# Patient Record
Sex: Female | Born: 1945 | Race: White | Hispanic: No | Smoking: Former smoker
Health system: Southern US, Community
[De-identification: ages and names within clinical notes are randomized; demographics above are authoritative.]

## PROBLEM LIST (undated history)

## (undated) DIAGNOSIS — I340 Nonrheumatic mitral (valve) insufficiency: Secondary | ICD-10-CM

## (undated) DIAGNOSIS — J309 Allergic rhinitis, unspecified: Secondary | ICD-10-CM

## (undated) DIAGNOSIS — M1990 Unspecified osteoarthritis, unspecified site: Secondary | ICD-10-CM

## (undated) DIAGNOSIS — I428 Other cardiomyopathies: Secondary | ICD-10-CM

## (undated) DIAGNOSIS — E785 Hyperlipidemia, unspecified: Secondary | ICD-10-CM

## (undated) DIAGNOSIS — I10 Essential (primary) hypertension: Secondary | ICD-10-CM

## (undated) DIAGNOSIS — K219 Gastro-esophageal reflux disease without esophagitis: Secondary | ICD-10-CM

## (undated) DIAGNOSIS — C3490 Malignant neoplasm of unspecified part of unspecified bronchus or lung: Secondary | ICD-10-CM

## (undated) DIAGNOSIS — F419 Anxiety disorder, unspecified: Secondary | ICD-10-CM

## (undated) DIAGNOSIS — J4489 Other specified chronic obstructive pulmonary disease: Secondary | ICD-10-CM

## (undated) DIAGNOSIS — J449 Chronic obstructive pulmonary disease, unspecified: Secondary | ICD-10-CM

## (undated) DIAGNOSIS — G4700 Insomnia, unspecified: Secondary | ICD-10-CM

## (undated) DIAGNOSIS — Z87898 Personal history of other specified conditions: Secondary | ICD-10-CM

## (undated) DIAGNOSIS — F429 Obsessive-compulsive disorder, unspecified: Secondary | ICD-10-CM

## (undated) DIAGNOSIS — K8020 Calculus of gallbladder without cholecystitis without obstruction: Secondary | ICD-10-CM

## (undated) DIAGNOSIS — J189 Pneumonia, unspecified organism: Secondary | ICD-10-CM

## (undated) DIAGNOSIS — G43909 Migraine, unspecified, not intractable, without status migrainosus: Secondary | ICD-10-CM

## (undated) DIAGNOSIS — F32A Depression, unspecified: Secondary | ICD-10-CM

## (undated) DIAGNOSIS — F329 Major depressive disorder, single episode, unspecified: Secondary | ICD-10-CM

## (undated) DIAGNOSIS — S22009A Unspecified fracture of unspecified thoracic vertebra, initial encounter for closed fracture: Secondary | ICD-10-CM

## (undated) DIAGNOSIS — I4891 Unspecified atrial fibrillation: Secondary | ICD-10-CM

## (undated) DIAGNOSIS — I509 Heart failure, unspecified: Secondary | ICD-10-CM

## (undated) HISTORY — DX: Other cardiomyopathies: I42.8

## (undated) HISTORY — DX: Unspecified atrial fibrillation: I48.91

## (undated) HISTORY — DX: Personal history of other specified conditions: Z87.898

## (undated) HISTORY — DX: Obsessive-compulsive disorder, unspecified: F42.9

## (undated) HISTORY — DX: Nonrheumatic mitral (valve) insufficiency: I34.0

## (undated) HISTORY — DX: Chronic obstructive pulmonary disease, unspecified: J44.9

## (undated) HISTORY — DX: Calculus of gallbladder without cholecystitis without obstruction: K80.20

## (undated) HISTORY — DX: Insomnia, unspecified: G47.00

## (undated) HISTORY — DX: Hyperlipidemia, unspecified: E78.5

## (undated) HISTORY — DX: Unspecified osteoarthritis, unspecified site: M19.90

## (undated) HISTORY — DX: Depression, unspecified: F32.A

## (undated) HISTORY — DX: Major depressive disorder, single episode, unspecified: F32.9

## (undated) HISTORY — PX: CARDIOVERSION: SHX1299

## (undated) HISTORY — DX: Essential (primary) hypertension: I10

## (undated) HISTORY — DX: Anxiety disorder, unspecified: F41.9

---

## 1970-02-09 HISTORY — PX: TUBAL LIGATION: SHX77

## 2003-06-24 ENCOUNTER — Inpatient Hospital Stay (HOSPITAL_COMMUNITY): Admission: EM | Admit: 2003-06-24 | Discharge: 2003-06-26 | Payer: Self-pay | Admitting: Emergency Medicine

## 2003-06-25 ENCOUNTER — Encounter (INDEPENDENT_AMBULATORY_CARE_PROVIDER_SITE_OTHER): Payer: Self-pay | Admitting: Cardiology

## 2003-06-28 ENCOUNTER — Encounter: Admission: RE | Admit: 2003-06-28 | Discharge: 2003-06-28 | Payer: Self-pay | Admitting: Internal Medicine

## 2003-07-02 ENCOUNTER — Encounter: Admission: RE | Admit: 2003-07-02 | Discharge: 2003-07-02 | Payer: Self-pay | Admitting: Internal Medicine

## 2003-07-09 ENCOUNTER — Encounter: Admission: RE | Admit: 2003-07-09 | Discharge: 2003-07-09 | Payer: Self-pay | Admitting: Internal Medicine

## 2003-07-11 ENCOUNTER — Encounter: Admission: RE | Admit: 2003-07-11 | Discharge: 2003-07-11 | Payer: Self-pay | Admitting: Internal Medicine

## 2003-07-17 ENCOUNTER — Encounter: Admission: RE | Admit: 2003-07-17 | Discharge: 2003-07-17 | Payer: Self-pay | Admitting: Internal Medicine

## 2003-07-23 ENCOUNTER — Encounter: Admission: RE | Admit: 2003-07-23 | Discharge: 2003-07-23 | Payer: Self-pay | Admitting: Internal Medicine

## 2003-07-26 ENCOUNTER — Encounter: Admission: RE | Admit: 2003-07-26 | Discharge: 2003-07-26 | Payer: Self-pay | Admitting: Internal Medicine

## 2003-07-30 ENCOUNTER — Encounter: Admission: RE | Admit: 2003-07-30 | Discharge: 2003-07-30 | Payer: Self-pay | Admitting: Internal Medicine

## 2003-08-01 ENCOUNTER — Ambulatory Visit (HOSPITAL_COMMUNITY): Admission: RE | Admit: 2003-08-01 | Discharge: 2003-08-01 | Payer: Self-pay | Admitting: Internal Medicine

## 2003-08-06 ENCOUNTER — Ambulatory Visit (HOSPITAL_COMMUNITY): Admission: RE | Admit: 2003-08-06 | Discharge: 2003-08-06 | Payer: Self-pay | Admitting: Cardiology

## 2003-08-16 ENCOUNTER — Encounter: Admission: RE | Admit: 2003-08-16 | Discharge: 2003-08-16 | Payer: Self-pay | Admitting: Internal Medicine

## 2003-08-23 ENCOUNTER — Encounter: Admission: RE | Admit: 2003-08-23 | Discharge: 2003-08-23 | Payer: Self-pay | Admitting: Internal Medicine

## 2003-08-24 ENCOUNTER — Encounter: Admission: RE | Admit: 2003-08-24 | Discharge: 2003-08-24 | Payer: Self-pay | Admitting: Internal Medicine

## 2003-09-07 ENCOUNTER — Encounter: Admission: RE | Admit: 2003-09-07 | Discharge: 2003-09-07 | Payer: Self-pay | Admitting: Internal Medicine

## 2003-09-13 ENCOUNTER — Encounter: Admission: RE | Admit: 2003-09-13 | Discharge: 2003-09-13 | Payer: Self-pay | Admitting: Internal Medicine

## 2003-10-05 ENCOUNTER — Emergency Department (HOSPITAL_COMMUNITY): Admission: EM | Admit: 2003-10-05 | Discharge: 2003-10-05 | Payer: Self-pay | Admitting: Emergency Medicine

## 2003-11-22 ENCOUNTER — Ambulatory Visit: Payer: Self-pay | Admitting: Internal Medicine

## 2003-12-06 ENCOUNTER — Ambulatory Visit: Payer: Self-pay | Admitting: Internal Medicine

## 2004-02-15 ENCOUNTER — Ambulatory Visit: Payer: Self-pay | Admitting: Internal Medicine

## 2004-05-21 ENCOUNTER — Ambulatory Visit: Payer: Self-pay | Admitting: Internal Medicine

## 2004-06-01 ENCOUNTER — Emergency Department (HOSPITAL_COMMUNITY): Admission: EM | Admit: 2004-06-01 | Discharge: 2004-06-02 | Payer: Self-pay | Admitting: Emergency Medicine

## 2004-06-05 ENCOUNTER — Emergency Department (HOSPITAL_COMMUNITY): Admission: EM | Admit: 2004-06-05 | Discharge: 2004-06-05 | Payer: Self-pay | Admitting: Family Medicine

## 2004-06-25 ENCOUNTER — Ambulatory Visit: Payer: Self-pay | Admitting: Internal Medicine

## 2004-07-25 ENCOUNTER — Ambulatory Visit: Payer: Self-pay | Admitting: Internal Medicine

## 2004-08-08 ENCOUNTER — Ambulatory Visit: Payer: Self-pay | Admitting: Internal Medicine

## 2005-10-05 ENCOUNTER — Ambulatory Visit: Payer: Self-pay | Admitting: Hospitalist

## 2005-10-27 ENCOUNTER — Ambulatory Visit (HOSPITAL_COMMUNITY): Admission: RE | Admit: 2005-10-27 | Discharge: 2005-10-27 | Payer: Self-pay | Admitting: Hospitalist

## 2005-10-27 ENCOUNTER — Ambulatory Visit: Payer: Self-pay | Admitting: Hospitalist

## 2005-12-29 DIAGNOSIS — Z87442 Personal history of urinary calculi: Secondary | ICD-10-CM

## 2005-12-29 DIAGNOSIS — G4700 Insomnia, unspecified: Secondary | ICD-10-CM | POA: Insufficient documentation

## 2005-12-29 DIAGNOSIS — I10 Essential (primary) hypertension: Secondary | ICD-10-CM | POA: Insufficient documentation

## 2005-12-29 DIAGNOSIS — I38 Endocarditis, valve unspecified: Secondary | ICD-10-CM | POA: Insufficient documentation

## 2005-12-29 DIAGNOSIS — K8020 Calculus of gallbladder without cholecystitis without obstruction: Secondary | ICD-10-CM | POA: Insufficient documentation

## 2005-12-29 DIAGNOSIS — F429 Obsessive-compulsive disorder, unspecified: Secondary | ICD-10-CM | POA: Insufficient documentation

## 2005-12-29 DIAGNOSIS — F418 Other specified anxiety disorders: Secondary | ICD-10-CM

## 2005-12-29 DIAGNOSIS — I4891 Unspecified atrial fibrillation: Secondary | ICD-10-CM | POA: Insufficient documentation

## 2005-12-29 DIAGNOSIS — I428 Other cardiomyopathies: Secondary | ICD-10-CM

## 2006-11-05 ENCOUNTER — Encounter (INDEPENDENT_AMBULATORY_CARE_PROVIDER_SITE_OTHER): Payer: Self-pay | Admitting: *Deleted

## 2006-11-05 ENCOUNTER — Ambulatory Visit: Payer: Self-pay | Admitting: Internal Medicine

## 2006-11-05 LAB — CONVERTED CEMR LAB
BUN: 16 mg/dL (ref 6–23)
CO2: 29 meq/L (ref 19–32)
Creatinine, Ser: 0.99 mg/dL (ref 0.40–1.20)
Glucose, Bld: 112 mg/dL — ABNORMAL HIGH (ref 70–99)
Sodium: 142 meq/L (ref 135–145)

## 2006-11-12 ENCOUNTER — Telehealth: Payer: Self-pay | Admitting: *Deleted

## 2006-11-23 ENCOUNTER — Telehealth: Payer: Self-pay | Admitting: *Deleted

## 2007-01-31 ENCOUNTER — Telehealth (INDEPENDENT_AMBULATORY_CARE_PROVIDER_SITE_OTHER): Payer: Self-pay | Admitting: Pharmacy Technician

## 2007-04-08 ENCOUNTER — Emergency Department (HOSPITAL_COMMUNITY): Admission: EM | Admit: 2007-04-08 | Discharge: 2007-04-08 | Payer: Self-pay | Admitting: Emergency Medicine

## 2007-04-13 ENCOUNTER — Ambulatory Visit: Payer: Self-pay | Admitting: *Deleted

## 2007-04-13 DIAGNOSIS — J019 Acute sinusitis, unspecified: Secondary | ICD-10-CM | POA: Insufficient documentation

## 2007-05-03 ENCOUNTER — Ambulatory Visit: Payer: Self-pay | Admitting: Internal Medicine

## 2007-06-20 ENCOUNTER — Telehealth (INDEPENDENT_AMBULATORY_CARE_PROVIDER_SITE_OTHER): Payer: Self-pay | Admitting: *Deleted

## 2007-06-21 ENCOUNTER — Telehealth (INDEPENDENT_AMBULATORY_CARE_PROVIDER_SITE_OTHER): Payer: Self-pay | Admitting: *Deleted

## 2008-01-25 ENCOUNTER — Emergency Department (HOSPITAL_COMMUNITY): Admission: EM | Admit: 2008-01-25 | Discharge: 2008-01-25 | Payer: Self-pay | Admitting: Emergency Medicine

## 2008-02-17 ENCOUNTER — Telehealth: Payer: Self-pay | Admitting: Internal Medicine

## 2008-02-23 ENCOUNTER — Encounter: Payer: Self-pay | Admitting: Internal Medicine

## 2008-02-23 ENCOUNTER — Ambulatory Visit: Payer: Self-pay | Admitting: Internal Medicine

## 2008-02-26 LAB — CONVERTED CEMR LAB
Albumin: 3.7 g/dL (ref 3.5–5.2)
Alkaline Phosphatase: 68 units/L (ref 39–117)
CO2: 23 meq/L (ref 19–32)
Creatinine, Ser: 0.72 mg/dL (ref 0.40–1.20)
Sodium: 147 meq/L — ABNORMAL HIGH (ref 135–145)

## 2008-02-27 ENCOUNTER — Encounter: Payer: Self-pay | Admitting: Internal Medicine

## 2008-03-16 ENCOUNTER — Telehealth: Payer: Self-pay | Admitting: Internal Medicine

## 2008-03-28 ENCOUNTER — Ambulatory Visit: Payer: Self-pay | Admitting: Internal Medicine

## 2008-03-28 DIAGNOSIS — J449 Chronic obstructive pulmonary disease, unspecified: Secondary | ICD-10-CM | POA: Insufficient documentation

## 2008-04-20 ENCOUNTER — Telehealth: Payer: Self-pay | Admitting: Internal Medicine

## 2008-04-20 DIAGNOSIS — J441 Chronic obstructive pulmonary disease with (acute) exacerbation: Secondary | ICD-10-CM | POA: Insufficient documentation

## 2008-05-21 ENCOUNTER — Ambulatory Visit: Payer: Self-pay | Admitting: Internal Medicine

## 2008-06-21 ENCOUNTER — Telehealth: Payer: Self-pay | Admitting: Internal Medicine

## 2008-06-25 ENCOUNTER — Telehealth: Payer: Self-pay | Admitting: Internal Medicine

## 2008-07-20 ENCOUNTER — Ambulatory Visit: Payer: Self-pay | Admitting: Internal Medicine

## 2008-07-20 ENCOUNTER — Encounter: Payer: Self-pay | Admitting: Internal Medicine

## 2008-07-20 LAB — CONVERTED CEMR LAB
Alkaline Phosphatase: 71 units/L (ref 39–117)
CO2: 27 meq/L (ref 19–32)
Calcium: 9.4 mg/dL (ref 8.4–10.5)
Creatinine, Ser: 1.04 mg/dL (ref 0.40–1.20)
GFR calc Af Amer: >60 mL/min (ref >60)
Glucose, Bld: 97 mg/dL (ref 70–99)
Potassium: 3.6 meq/L (ref 3.5–5.3)
Sodium: 145 meq/L (ref 135–145)

## 2008-07-30 ENCOUNTER — Ambulatory Visit: Payer: Self-pay | Admitting: Pulmonary Disease

## 2008-08-08 ENCOUNTER — Telehealth: Payer: Self-pay | Admitting: Pulmonary Disease

## 2008-08-24 ENCOUNTER — Ambulatory Visit: Payer: Self-pay | Admitting: Pulmonary Disease

## 2008-09-10 ENCOUNTER — Telehealth: Payer: Self-pay | Admitting: Internal Medicine

## 2008-09-25 ENCOUNTER — Telehealth: Payer: Self-pay | Admitting: *Deleted

## 2008-10-23 ENCOUNTER — Telehealth (INDEPENDENT_AMBULATORY_CARE_PROVIDER_SITE_OTHER): Payer: Self-pay | Admitting: *Deleted

## 2008-10-25 ENCOUNTER — Ambulatory Visit: Payer: Self-pay | Admitting: Infectious Diseases

## 2008-10-25 ENCOUNTER — Encounter: Payer: Self-pay | Admitting: Internal Medicine

## 2008-10-26 ENCOUNTER — Ambulatory Visit (HOSPITAL_COMMUNITY): Admission: RE | Admit: 2008-10-26 | Discharge: 2008-10-26 | Payer: Self-pay | Admitting: Internal Medicine

## 2008-10-26 ENCOUNTER — Encounter: Payer: Self-pay | Admitting: Internal Medicine

## 2008-10-30 ENCOUNTER — Ambulatory Visit (HOSPITAL_COMMUNITY): Admission: RE | Admit: 2008-10-30 | Discharge: 2008-10-30 | Payer: Self-pay | Admitting: Internal Medicine

## 2008-10-31 ENCOUNTER — Encounter: Payer: Self-pay | Admitting: Internal Medicine

## 2008-11-07 ENCOUNTER — Ambulatory Visit (HOSPITAL_COMMUNITY): Admission: RE | Admit: 2008-11-07 | Discharge: 2008-11-07 | Payer: Self-pay | Admitting: Internal Medicine

## 2008-11-07 ENCOUNTER — Ambulatory Visit: Payer: Self-pay | Admitting: Internal Medicine

## 2008-11-07 ENCOUNTER — Encounter: Payer: Self-pay | Admitting: Internal Medicine

## 2008-11-22 ENCOUNTER — Emergency Department (HOSPITAL_COMMUNITY): Admission: EM | Admit: 2008-11-22 | Discharge: 2008-11-22 | Payer: Self-pay | Admitting: Emergency Medicine

## 2008-11-26 ENCOUNTER — Encounter (INDEPENDENT_AMBULATORY_CARE_PROVIDER_SITE_OTHER): Payer: Self-pay | Admitting: Internal Medicine

## 2008-11-26 ENCOUNTER — Ambulatory Visit: Payer: Self-pay | Admitting: Internal Medicine

## 2008-11-26 LAB — CONVERTED CEMR LAB
Cholesterol: 181 mg/dL (ref 0–200)
HDL: 45 mg/dL (ref >39)
Total CHOL/HDL Ratio: 4
Triglycerides: 119 mg/dL (ref <150)

## 2008-11-28 ENCOUNTER — Ambulatory Visit: Payer: Self-pay | Admitting: Sports Medicine

## 2008-11-28 DIAGNOSIS — M1990 Unspecified osteoarthritis, unspecified site: Secondary | ICD-10-CM

## 2008-12-11 ENCOUNTER — Encounter: Admission: RE | Admit: 2008-12-11 | Discharge: 2009-02-06 | Payer: Self-pay | Admitting: Sports Medicine

## 2008-12-11 ENCOUNTER — Telehealth: Payer: Self-pay | Admitting: Internal Medicine

## 2008-12-17 ENCOUNTER — Telehealth: Payer: Self-pay | Admitting: Internal Medicine

## 2008-12-28 ENCOUNTER — Ambulatory Visit: Payer: Self-pay | Admitting: Infectious Disease

## 2008-12-28 ENCOUNTER — Inpatient Hospital Stay (HOSPITAL_COMMUNITY): Admission: RE | Admit: 2008-12-28 | Discharge: 2008-12-31 | Payer: Self-pay | Admitting: Internal Medicine

## 2008-12-28 ENCOUNTER — Ambulatory Visit: Payer: Self-pay | Admitting: Internal Medicine

## 2008-12-31 ENCOUNTER — Encounter (INDEPENDENT_AMBULATORY_CARE_PROVIDER_SITE_OTHER): Payer: Self-pay | Admitting: Internal Medicine

## 2009-01-07 ENCOUNTER — Ambulatory Visit: Payer: Self-pay | Admitting: Internal Medicine

## 2009-01-07 DIAGNOSIS — F17200 Nicotine dependence, unspecified, uncomplicated: Secondary | ICD-10-CM

## 2009-01-08 LAB — CONVERTED CEMR LAB
BUN: 16 mg/dL (ref 6–23)
Basophils Absolute: 0 10*3/uL (ref 0.0–0.1)
Basophils Relative: 0 % (ref 0–1)
CO2: 26 meq/L (ref 19–32)
Calcium: 9.1 mg/dL (ref 8.4–10.5)
Chloride: 101 meq/L (ref 96–112)
Creatinine, Ser: 0.79 mg/dL (ref 0.40–1.20)
Eosinophils Relative: 1 % (ref 0–5)
Glucose, Bld: 91 mg/dL (ref 70–99)
Lymphs Abs: 2.9 10*3/uL (ref 0.7–4.0)
Monocytes Absolute: 1.1 10*3/uL — ABNORMAL HIGH (ref 0.1–1.0)
Monocytes Relative: 10 % (ref 3–12)
Potassium: 4 meq/L (ref 3.5–5.3)
RDW: 14.1 % (ref 11.5–15.5)
Sodium: 142 meq/L (ref 135–145)
WBC: 11 10*3/uL — ABNORMAL HIGH (ref 4.0–10.5)

## 2009-01-14 ENCOUNTER — Encounter: Payer: Self-pay | Admitting: Internal Medicine

## 2009-01-22 ENCOUNTER — Ambulatory Visit: Payer: Self-pay | Admitting: Infectious Diseases

## 2009-01-22 LAB — CONVERTED CEMR LAB
BUN: 20 mg/dL (ref 6–23)
Chloride: 102 meq/L (ref 96–112)
Glucose, Bld: 85 mg/dL (ref 70–99)
Potassium: 4 meq/L (ref 3.5–5.3)

## 2009-02-15 ENCOUNTER — Encounter: Payer: Self-pay | Admitting: Internal Medicine

## 2009-03-29 ENCOUNTER — Emergency Department (HOSPITAL_COMMUNITY): Admission: EM | Admit: 2009-03-29 | Discharge: 2009-03-29 | Payer: Self-pay | Admitting: Emergency Medicine

## 2009-04-02 ENCOUNTER — Ambulatory Visit: Payer: Self-pay | Admitting: Internal Medicine

## 2009-04-03 ENCOUNTER — Telehealth: Payer: Self-pay | Admitting: Internal Medicine

## 2009-05-02 ENCOUNTER — Ambulatory Visit (HOSPITAL_COMMUNITY): Admission: RE | Admit: 2009-05-02 | Discharge: 2009-05-02 | Payer: Self-pay | Admitting: Internal Medicine

## 2009-05-02 ENCOUNTER — Ambulatory Visit: Payer: Self-pay | Admitting: Internal Medicine

## 2009-05-03 ENCOUNTER — Telehealth (INDEPENDENT_AMBULATORY_CARE_PROVIDER_SITE_OTHER): Payer: Self-pay | Admitting: *Deleted

## 2009-05-06 ENCOUNTER — Telehealth (INDEPENDENT_AMBULATORY_CARE_PROVIDER_SITE_OTHER): Payer: Self-pay | Admitting: *Deleted

## 2009-05-10 ENCOUNTER — Telehealth: Payer: Self-pay | Admitting: Internal Medicine

## 2009-05-13 ENCOUNTER — Ambulatory Visit (HOSPITAL_COMMUNITY): Admission: RE | Admit: 2009-05-13 | Discharge: 2009-05-13 | Payer: Self-pay | Admitting: Internal Medicine

## 2009-05-13 ENCOUNTER — Ambulatory Visit: Payer: Self-pay | Admitting: Internal Medicine

## 2009-05-17 ENCOUNTER — Telehealth (INDEPENDENT_AMBULATORY_CARE_PROVIDER_SITE_OTHER): Payer: Self-pay | Admitting: *Deleted

## 2009-05-20 ENCOUNTER — Encounter: Payer: Self-pay | Admitting: Internal Medicine

## 2009-05-20 ENCOUNTER — Ambulatory Visit: Payer: Self-pay | Admitting: Internal Medicine

## 2009-05-20 DIAGNOSIS — M25569 Pain in unspecified knee: Secondary | ICD-10-CM

## 2009-05-21 ENCOUNTER — Ambulatory Visit (HOSPITAL_COMMUNITY): Admission: RE | Admit: 2009-05-21 | Discharge: 2009-05-21 | Payer: Self-pay | Admitting: Internal Medicine

## 2009-05-23 ENCOUNTER — Telehealth: Payer: Self-pay | Admitting: Internal Medicine

## 2009-05-24 ENCOUNTER — Ambulatory Visit: Payer: Self-pay | Admitting: Internal Medicine

## 2009-05-24 DIAGNOSIS — E785 Hyperlipidemia, unspecified: Secondary | ICD-10-CM

## 2009-05-29 ENCOUNTER — Telehealth: Payer: Self-pay | Admitting: Internal Medicine

## 2009-06-04 ENCOUNTER — Telehealth: Payer: Self-pay | Admitting: Internal Medicine

## 2009-06-09 ENCOUNTER — Emergency Department (HOSPITAL_COMMUNITY): Admission: EM | Admit: 2009-06-09 | Discharge: 2009-06-10 | Payer: Self-pay | Admitting: Emergency Medicine

## 2009-06-14 ENCOUNTER — Ambulatory Visit: Payer: Self-pay | Admitting: Internal Medicine

## 2009-06-19 ENCOUNTER — Telehealth: Payer: Self-pay | Admitting: Internal Medicine

## 2009-06-24 ENCOUNTER — Telehealth: Payer: Self-pay | Admitting: Internal Medicine

## 2009-06-26 ENCOUNTER — Ambulatory Visit: Payer: Self-pay | Admitting: Infectious Disease

## 2009-06-27 ENCOUNTER — Encounter: Payer: Self-pay | Admitting: Internal Medicine

## 2009-06-27 LAB — CONVERTED CEMR LAB
Amphetamine Screen, Ur: NEGATIVE
BUN: 13 mg/dL (ref 6–23)
Barbiturate Quant, Ur: NEGATIVE
Benzodiazepines.: NEGATIVE
Chloride: 101 meq/L (ref 96–112)
Cocaine Metabolites: NEGATIVE
Creatinine, Ser: 0.8 mg/dL (ref 0.40–1.20)
Methadone: NEGATIVE
Phencyclidine (PCP): NEGATIVE
Potassium: 4.4 meq/L (ref 3.5–5.3)
Propoxyphene: NEGATIVE

## 2009-06-28 ENCOUNTER — Telehealth: Payer: Self-pay | Admitting: Internal Medicine

## 2009-06-28 ENCOUNTER — Encounter (INDEPENDENT_AMBULATORY_CARE_PROVIDER_SITE_OTHER): Payer: Self-pay | Admitting: Emergency Medicine

## 2009-06-28 ENCOUNTER — Emergency Department (HOSPITAL_COMMUNITY): Admission: EM | Admit: 2009-06-28 | Discharge: 2009-06-28 | Payer: Self-pay | Admitting: Emergency Medicine

## 2009-06-28 ENCOUNTER — Ambulatory Visit: Payer: Self-pay | Admitting: Vascular Surgery

## 2009-07-01 ENCOUNTER — Telehealth: Payer: Self-pay | Admitting: Internal Medicine

## 2009-07-03 ENCOUNTER — Ambulatory Visit: Payer: Self-pay | Admitting: Internal Medicine

## 2009-07-03 DIAGNOSIS — R609 Edema, unspecified: Secondary | ICD-10-CM

## 2009-07-04 ENCOUNTER — Telehealth: Payer: Self-pay | Admitting: Pulmonary Disease

## 2009-07-10 ENCOUNTER — Telehealth: Payer: Self-pay | Admitting: Internal Medicine

## 2009-07-16 ENCOUNTER — Telehealth: Payer: Self-pay | Admitting: Internal Medicine

## 2009-07-25 ENCOUNTER — Encounter: Payer: Self-pay | Admitting: Internal Medicine

## 2009-07-29 ENCOUNTER — Ambulatory Visit: Payer: Self-pay | Admitting: Internal Medicine

## 2009-07-29 ENCOUNTER — Emergency Department (HOSPITAL_COMMUNITY): Admission: EM | Admit: 2009-07-29 | Discharge: 2009-07-29 | Payer: Self-pay | Admitting: Emergency Medicine

## 2009-08-13 ENCOUNTER — Telehealth: Payer: Self-pay | Admitting: *Deleted

## 2009-08-19 ENCOUNTER — Telehealth: Payer: Self-pay | Admitting: *Deleted

## 2009-08-28 ENCOUNTER — Ambulatory Visit: Payer: Self-pay | Admitting: Internal Medicine

## 2009-09-05 ENCOUNTER — Telehealth: Payer: Self-pay | Admitting: Internal Medicine

## 2009-09-12 ENCOUNTER — Telehealth: Payer: Self-pay | Admitting: Internal Medicine

## 2009-10-02 ENCOUNTER — Telehealth: Payer: Self-pay | Admitting: Internal Medicine

## 2009-10-10 ENCOUNTER — Telehealth: Payer: Self-pay | Admitting: Internal Medicine

## 2009-10-15 ENCOUNTER — Telehealth: Payer: Self-pay | Admitting: Internal Medicine

## 2009-10-21 ENCOUNTER — Ambulatory Visit: Payer: Self-pay | Admitting: Internal Medicine

## 2009-10-21 DIAGNOSIS — M1990 Unspecified osteoarthritis, unspecified site: Secondary | ICD-10-CM | POA: Insufficient documentation

## 2009-10-25 ENCOUNTER — Telehealth: Payer: Self-pay | Admitting: *Deleted

## 2009-11-08 ENCOUNTER — Telehealth: Payer: Self-pay | Admitting: Internal Medicine

## 2009-11-18 ENCOUNTER — Telehealth: Payer: Self-pay | Admitting: Internal Medicine

## 2009-12-02 ENCOUNTER — Telehealth: Payer: Self-pay | Admitting: Internal Medicine

## 2009-12-23 ENCOUNTER — Telehealth: Payer: Self-pay | Admitting: Internal Medicine

## 2009-12-24 ENCOUNTER — Telehealth: Payer: Self-pay | Admitting: Internal Medicine

## 2010-01-06 ENCOUNTER — Telehealth: Payer: Self-pay | Admitting: Internal Medicine

## 2010-01-14 ENCOUNTER — Telehealth: Payer: Self-pay | Admitting: Internal Medicine

## 2010-01-22 ENCOUNTER — Ambulatory Visit: Payer: Self-pay | Admitting: Internal Medicine

## 2010-01-29 ENCOUNTER — Telehealth: Payer: Self-pay | Admitting: Internal Medicine

## 2010-02-05 ENCOUNTER — Telehealth (INDEPENDENT_AMBULATORY_CARE_PROVIDER_SITE_OTHER): Payer: Self-pay | Admitting: *Deleted

## 2010-02-10 ENCOUNTER — Telehealth: Payer: Self-pay | Admitting: Internal Medicine

## 2010-02-17 ENCOUNTER — Ambulatory Visit: Admit: 2010-02-17 | Payer: Self-pay

## 2010-02-25 ENCOUNTER — Telehealth: Payer: Self-pay | Admitting: Internal Medicine

## 2010-02-28 ENCOUNTER — Ambulatory Visit: Admission: RE | Admit: 2010-02-28 | Discharge: 2010-02-28 | Payer: Self-pay | Source: Home / Self Care

## 2010-02-28 LAB — CONVERTED CEMR LAB
AST: 15 units/L (ref 0–37)
Albumin: 4.3 g/dL (ref 3.5–5.2)
Alkaline Phosphatase: 104 units/L (ref 39–117)
Total Bilirubin: 0.3 mg/dL (ref 0.3–1.2)

## 2010-03-02 ENCOUNTER — Encounter: Payer: Self-pay | Admitting: Orthopedic Surgery
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
## 2010-03-11 NOTE — Progress Notes (Signed)
Summary: phone/gg  Phone Note Call from Patient   Caller: Patient Summary of Call: Pt called to let us know she was feeling better.  Her breathing is better and thrush is better. She has very dry mouth and cough is worse, productive cough that may last 45 minutes since saturday.  cough sounds deep. she takes HYDROMET 5-1.5 MG/5ML SYRP without relief. Pt # [id number] Pt on a avelox/steriods and side effects list may cause tendon problems.  she is concerned.................can you call this patient, please... Initial call taken by: Merrie Roof RN,  May 06, 2009 2:03 PM  Follow-up for Phone Call        Main c/o is dry mouth since starting the meds including the steroids and inhaler.  SHe thinks it is related to the thrush.   Asking regarding gargling with salt water and if it is ok - told her it is fine. WIll send in electronically fluconazole 200 by mouth once daily for 5 days. Advised re low risk of tendon rupture but she should not engage in aggressive vigoorous activity. Also trouble sleeping due to the meds.  Cough is worse even though breathing is better.  No fevers chills. Follow-up by: Clydie Braun MD,  May 06, 2009 3:23 PM  Additional Follow-up for Phone Call Additional follow up Details #1::        Inocencio Homes and chilon can we move her apptment up to next week for repeat eval Additional Follow-up by: Clydie Braun MD,  May 06, 2009 3:55 PM    Additional Follow-up for Phone Call Additional follow up Details #2::    appointment schedule for next week. Follow-up by: Merrie Roof RN,  May 07, 2009 10:55 AM  New/Updated Medications: DIFLUCAN 200 MG TABS (FLUCONAZOLE) one by mouth once daily for 5 days Prescriptions: DIFLUCAN 200 MG TABS (FLUCONAZOLE) one by mouth once daily for 5 days  #5 x 0   Entered and Authorized by:   Clydie Braun MD   Signed by:   Clydie Braun MD on 05/06/2009   Method used:   Electronically to        [facility]  [address] [id number]  (retail)       [address]       [place], [place]  [address]       Ph: [phone] or [phone]       Fax: [phone]   RxID:   [id number]

## 2010-03-11 NOTE — Progress Notes (Signed)
Summary: refill/gg  Phone Note Refill Request  on Jun 24, 2009 2:01 PM  Refills Requested: Medication #1:  PERCOCET 5-325 MG TABS take 1 tablet every 6 hours as needed for pain   Last Refilled: 06/14/2009 Pt needs refill Pt # [phone]   Method Requested: Pick up at Office Initial call taken by: Merrie Roof RN,  Jun 24, 2009 2:01 PM  Follow-up for Phone Call        Patient was started on percocet by Dr. [name] and then refill on May the 6 by Dr. Comer Locket; if she is still in pain (assuming is her knee the problem), would be better for her to be reevaluated, before continue giving more pain meds only.     Appended Document: refill/gg scheduled to see Dr Comer Locket on Wed.

## 2010-03-11 NOTE — Progress Notes (Signed)
Summary: nos appt  Phone Note Call from Patient   Caller: [email or web address]  Call For: alva Summary of Call: LMTCB x2 to rsc nos from 5/25. Initial call taken by: Darletta Moll,  Jul 04, 2009 2:21 PM
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
## 2010-03-11 NOTE — Progress Notes (Signed)
Summary: phone/gg  Phone Note Call from Patient   Caller: Patient Summary of Call: Pt called with c/o thrush in mouth. She was treated on 7/28 with FLUCONAZOLE 200 MG TABS (FLUCONAZOLE) Take 1 tablet by mouth once a day  #3 x 0.  She states the thrush is returning and needs more meds.  Please advise  -  would you want her seen for eval? Pt 3 [phone] Initial call taken by: Merrie Roof RN,  September 12, 2009 11:51 AM  Follow-up for Phone Call        We need to see to see her to make certain the thrush is the correct dx. Pls make pt an appt. Can be an acute slot with any resident Follow-up by: Blanch Media MD,  September 12, 2009 12:05 PM     Appended Document: phone/gg appointment given for today.....message left for pt.

## 2010-03-11 NOTE — Progress Notes (Signed)
Summary: phone/gg  Phone Note Call from Patient   Caller: Patient Complaint: Abdominal Pain Summary of Call: Pt called c/o severe knee pain.  She was seen here for the same on 4/11 and put on mobic 15 mg daily.  Pt called today stating the meds don't help at all.  She is in tears and needs something else for the pain.   Pt # [id number] Initial call taken by: Merrie Roof RN,  May 23, 2009 9:38 AM  Follow-up for Phone Call        As protocol,I don't give narcs over the phone, esp with a pt I have never seen.  I will call in Tramadol for a couple of days until she can be furher evaluated (her plain film was negative)  Please see if she can get in today or tomorrow for eval.  Let me know when her appt is so I can order appropriate # of tramadol.  Thanks  I ordered 6 tramadol to get her through till her appt. Follow-up by: Blanch Media MD,  May 23, 2009 10:37 AM  Additional Follow-up for Phone Call Additional follow up Details #1::        Inocencio Homes stated that pt hallucinates when she takes tramadol. [facility] will cancel Rx. Pt can take tylenol up to 1000 mg QID along with Mobic until appt tomorrow AM.  Additional Follow-up by: Blanch Media MD,  May 23, 2009 10:57 AM    Additional Follow-up for Phone Call Additional follow up Details #2::    Pt informed and she states she has been taking tylenol arthritis already.  She said she has some cough syrup with hydrocodone in it and will take that if needed.  It helps the pain and she wants to be honest with [place]. Follow-up by: Merrie Roof RN,  May 23, 2009 11:01 AM  Additional Follow-up for Phone Call Additional follow up Details #3:: Details for Additional Follow-up Action Taken: that is ok Additional Follow-up by: Blanch Media MD,  May 23, 2009 11:04 AM  New/Updated Medications: TRAMADOL HCL 50 MG TABS (TRAMADOL HCL) One pill by mouth every six hours as needed for knee pain. Prescriptions: TRAMADOL HCL 50 MG TABS  (TRAMADOL HCL) One pill by mouth every six hours as needed for knee pain.  #6 x 0   Entered and Authorized by:   Blanch Media MD   Signed by:   Blanch Media MD on 05/23/2009   Method used:   [facility]  [address] [id number]       [address]       [place], [place]  [address]       Ph: [phone] or [phone]       Fax: [phone]   RxID:   [id number]  [facility] Tr[facility] as pt is allergic Merrie Roof RN  May 23, 2009 10:58 AM

## 2010-03-11 NOTE — Assessment & Plan Note (Signed)
Summary: ACUTE-RE-EVALUATE PAIN/(MADERA)/CFB   Vital Signs:  Patient profile:   65 year old female Height:      65 inches (165.10 cm) Weight:      224.03 pounds (101.83 kg) BMI:     37.42 O2 Sat:      96 % on 2 L/min Temp:     99.2 degrees F (37.33 degrees C) oral Pulse rate:   77 / minute BP sitting:   114 / 66  (left arm)  Vitals Entered By: Angelina Ok RN (October 21, 2009 2:28 PM)  O2 Flow:  2 L/min Is Patient Diabetic? No Pain Assessment Patient in pain? yes     Location: all over Intensity: 5 Type: aching Onset of pain  Constant Nutritional Status BMI of > 30 = obese  Have you ever been in a relationship where you felt threatened, hurt or afraid?No   Does patient need assistance? Functional Status Self care Ambulation Impaired:Risk for fall Comments Has a cold.  On oxygen via canister.  Reevaulate her pain.  Coughing.  White to clear fuzzy color.  More shortness of breath.  Stuffiness keeps her awake.   Primary Care Provider:  Vassie Loll MD   History of Present Illness: 65 y/o female with pmh as described on the EMR; who comes to the clinic for followup and medication refill.  Pt reports 3-4 days of increased cough and congestion.  Pt does not feel this is a COPD exacerbation.  Does feel like she needs steroids or abx.    O/A:  pt reports persistent knee pain from her arthritis.  She was recently given a script for Vicodin from her PCP.  She states this helps relieve her pain tremendously.  She has a sufficient supply left and presented to clniic today for evaluation so that she may continue to get refills of her Vicodin if she needs it.   Patient denies palpitations, SOB, CP, abdominal pain, nausea, vomiting, depressed mood,fevers, chills, or other complaint.    Depression History:      The patient denies a depressed mood most of the day and a diminished interest in her usual daily activities.         Preventive Screening-Counseling &  Management  Alcohol-Tobacco     Alcohol drinks/day: 0     Smoking Status: current     Smoking Cessation Counseling: yes     Smoke Cessation Stage: contemplative     Packs/Day: aprox 1 cig daily     Year Started: 1979     Passive Smoke Exposure: yes  Current Medications (verified): 1)  Aspirin 81 Mg Tbec (Aspirin) .... Take 1 Tablet By Mouth Once A Day 2)  Prozac 40 Mg Caps (Fluoxetine Hcl) .... Take 1 Capsule By Mouth Once A Day 3)  Furosemide 40 Mg Tabs (Furosemide) .... Take 1 Tablet By Mouth Once A Day 4)  Tiazac 420 Mg Xr24h-Cap (Diltiazem Hcl Er Beads) .... Take 1capsule By Mouth Once A Day 5)  Claritin 10 Mg Tabs (Loratadine) .... Take 1 Tablet By Mouth Once A Day 6)  Advair Diskus 500-50 Mcg/dose Aepb (Fluticasone-Salmeterol) .Marland Kitchen.. 1 Puff Inhaled Two Times A Day 7)  Ventolin Hfa 108 (90 Base) Mcg/act Aers (Albuterol Sulfate) .Marland Kitchen.. 1-2 Puffs Every 4 Hours As Needed For Shorthness of Breath 8)  Spiriva Handihaler 18 Mcg Caps (Tiotropium Bromide Monohydrate) .Marland Kitchen.. 1 Cao Inhaled Daily. 9)  Voltaren 1 % Gel (Diclofenac Sodium) .... Apply To The Affected Area 2-3 Times Per Day As Needed For Pain  10)  Hydromet 5-1.5 Mg/14ml Syrp (Hydrocodone-Homatropine) .... Use 5 Ml Evey 6 Hours As Needed For Cough. 11)  K-Lor 20 Meq Pack (Potassium Chloride) .... Take 1 Tablet By Mouth Daily 12)  Vicodin 5-500 Mg Tabs (Hydrocodone-Acetaminophen) .... Take 1 Tablet Every 8 Hours As Needed For Severe Pain.  Allergies (verified): 1)  ! Benadryl 2)  ! Sudafed 3)  ! Ra Nite Time Cough (Doxylamine-Dm) 4)  ! Tramadol Hcl  Past History:  Past medical, surgical, family and social histories (including risk factors) reviewed for relevance to current acute and chronic problems.  Past Medical History: Reviewed history from 11/26/2008 and no changes required. Atrial fibrillation (cardioversion x2) Depression Hypertension Nonischemic cardiomyopathy Obsessive compulsive disorder Insomnia COPD  Past  Surgical History: Reviewed history from 12/29/2005 and no changes required. Tubal ligation  Family History: Reviewed history from 11/26/2008 and no changes required. Family History Asthma (daughter had when little - not anymore) Family History Emphysema (mother in old age) Cancer (brain, breast, liver, skin) Allergies  Social History: Reviewed history from 08/28/2009 and no changes required. Patient is a current smoker. Pt states smokes 1 cigarette a day. Pt is separated, lives with daughter and grand kids.  Physical Exam  General:  obese, alert, cooperative, NAD, speaking in full sentences. Head:  normocephalic and atraumatic.  Eyes:  pupils equal, pupils round, and pupils reactive to light.  anicteric. vision grossly intact.  EOMI Nose:  O2 in place via nasal cannula Mouth:  Oral mucosa and oropharynx without lesions or exudates.  Teeth in good repair.  MMM Neck:  no JVD supple, full ROM, and no masses.   Lungs:  normal respiratory effort, no intercostal retractions, good air movement, minimal wheezing present with decreased breath sounds bilaterally, no ronchi and no crackles.   Heart:  normal rate, irregular rhythm, no murmur, no rubs; no jvd. Abdomen:  soft, non-tender, and normal bowel sounds.   Extremities:  no edema, no cyanosis. Neurologic:  alert & oriented X3, cranial nerves II-XII intact;no focal deficit.   Skin:  turgor normal, color normal, and no rashes.   Psych:  pleasant Oriented X3, memory intact for recent and remote, and normally interactive.     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  Her symptoms are well controlled on Vicodin; would continue this for managment of her chronic pain.  She has an adequate supply of Vicodin, therefore will not give her an additional script today.  Will provide refill if needed in the future.  Her updated medication list for this  problem includes:    Aspirin 81 Mg Tbec (Aspirin) .Marland Kitchen... Take 1 tablet by mouth once a day    Vicodin 5-500 Mg Tabs (Hydrocodone-acetaminophen) .Marland Kitchen... Take 1 tablet every 8 hours as needed for severe pain.  Problem # 2:  COUGH (ICD-786.2) Pts symptoms are not c/w acute COPD exacerbation.  It is probable that she is experiencing a viral URI.  Advised pt to call the clinic or go to the ER if she develops any progressive SOB, C/P, worsening cough, fever, chills, or other concerning symptom.   Complete Medication List: 1)  Aspirin 81 Mg Tbec (Aspirin) .... Take 1 tablet by mouth once a day 2)  Prozac 40 Mg Caps (Fluoxetine hcl) .... Take 1 capsule by mouth once a day 3)  Furosemide 40 Mg Tabs (Furosemide) .... Take 1 tablet by mouth once a day 4)  Tiazac 420 Mg Xr24h-cap (  Diltiazem hcl er beads) .... Take 1capsule by mouth once a day 5)  Claritin 10 Mg Tabs (Loratadine) .... Take 1 tablet by mouth once a day 6)  Advair Diskus 500-50 Mcg/dose Aepb (Fluticasone-salmeterol) .Marland Kitchen.. 1 puff inhaled two times a day 7)  Ventolin Hfa 108 (90 Base) Mcg/act Aers (Albuterol sulfate) .Marland Kitchen.. 1-2 puffs every 4 hours as needed for shorthness of breath 8)  Spiriva Handihaler 18 Mcg Caps (Tiotropium bromide monohydrate) .Marland Kitchen.. 1 cao inhaled daily. 9)  Voltaren 1 % Gel (Diclofenac sodium) .... Apply to the affected area 2-3 times per day as needed for pain 10)  Hydromet 5-1.5 Mg/25ml Syrp (Hydrocodone-homatropine) .... Use 5 ml evey 6 hours as needed for cough. 11)  K-lor 20 Meq Pack (Potassium chloride) .... Take 1 tablet by mouth daily 12)  Vicodin 5-500 Mg Tabs (Hydrocodone-acetaminophen) .... Take 1 tablet every 8 hours as needed for severe pain.  Patient Instructions: 1)  Please schedule a follow-up appointment as needed. 2)  If you develop fever, chills, worsening cough, worsening shortness of breath, chest pain, or other concerning symptoms, please call the clinic at [phone] or go to the Emergency Room  Prevention  & Chronic Care Immunizations   Influenza vaccine: Not documented   Influenza vaccine deferral: Refused  (10/25/2008)    Tetanus booster: Not documented   Td booster deferral: Deferred  (10/25/2008)    Pneumococcal vaccine: Not documented   Pneumococcal vaccine deferral: Refused  (01/22/2009)    H. zoster vaccine: Not documented   H. zoster vaccine deferral: Deferred  (10/25/2008)  Colorectal Screening   Hemoccult: Not documented   Hemoccult action/deferral: Refused  (10/25/2008)    Colonoscopy: Not documented   Colonoscopy action/deferral: Refused  (11/26/2008)  Other Screening   Pap smear: Not documented   Pap smear action/deferral: Deferred  (10/25/2008)    Mammogram: ASSESSMENT: Negative - BI-RADS 1^MM DIGITAL SCREENING  (10/30/2008)   Mammogram action/deferral: No specific mammographic evidence of malignancy.  Screening mammogram in 1 year.     (10/30/2008)   Mammogram due: 11/2009    DXA bone density scan: Not documented   Smoking status: current  (10/21/2009)   Smoking cessation counseling: yes  (10/21/2009)  Lipids   Total Cholesterol: 182  (05/24/2009)   Lipid panel action/deferral: Lipid Panel ordered   LDL: 126  (05/24/2009)   LDL Direct: Not documented   HDL: 43  (05/24/2009)   Triglycerides: 66  (05/24/2009)    SGOT (AST): 10  (07/20/2008)   SGPT (ALT): <8 U/L  (07/20/2008)   Alkaline phosphatase: 71  (07/20/2008)   Total bilirubin: 0.3  (07/20/2008)  Hypertension   Last Blood Pressure: 114 / 66  (10/21/2009)   Serum creatinine: 0.80  (06/27/2009)   Serum potassium 4.4  (06/27/2009)  Self-Management Support :   Personal Goals (by the next clinic visit) :      Personal blood pressure goal: 140/90  (10/25/2008)     Personal LDL goal: 100  (06/26/2009)    Patient will work on the following items until the next clinic visit to reach self-care goals:     Medications and monitoring: take my medicines every day, bring all of my medications to every  visit  (10/21/2009)     Eating: drink diet soda or water instead of juice or soda, eat more vegetables, use fresh or frozen vegetables, eat foods that are low in salt, eat baked foods instead of fried foods, eat fruit for snacks and desserts, limit or avoid alcohol  (10/21/2009)  Activity: take a 30 minute walk every day  (10/21/2009)    Hypertension self-management support: Written self-care plan, Education handout, Pre-printed educational material  (10/21/2009)   Hypertension self-care plan printed.   Hypertension education handout printed    Lipid self-management support: Written self-care plan, Education handout, Pre-printed educational material  (10/21/2009)   Lipid self-care plan printed.   Lipid education handout printed    Vital Signs:  Patient profile:   65 year old female Height:      65 inches (165.10 cm) Weight:      224.03 pounds (101.83 kg) BMI:     37.42 O2 Sat:      96 % Temp:     99.2 degrees F (37.33 degrees C) oral Pulse rate:   77 / minute BP sitting:   114 / 66  (left arm)  Vitals Entered By: Angelina Ok RN (October 21, 2009 2:28 PM)  O2 Flow:  2 L/min

## 2010-03-11 NOTE — Progress Notes (Signed)
Summary: refill/ hla  Phone Note Refill Request Message from:  Patient on July 16, 2009 5:39 PM  Refills Requested: Medication #1:  HYDROMET 5-1.5 MG/5ML SYRP Use 5 ml evey 6 hours as needed for cough.   Dosage confirmed as above?Dosage Confirmed Initial call taken by: Marin Roberts RN,  July 16, 2009 5:39 PM  Follow-up for Phone Call        Refill approved-nurse to complete  Additional Follow-up for Phone Call Additional follow up Details #1::        Last refill of the cough syrup given to her; I know she has a bad COPD and she cough all the time especially now with the weather and season changes; but she also need to stop smoking and she is not doing that. I will give 2 refill and if the cough persist she needs to come to the office, so we can r/o other causes for her cough.    Prescriptions: HYDROMET 5-1.5 MG/5ML SYRP (HYDROCODONE-HOMATROPINE) Use 5 ml evey 6 hours as needed for cough.  #149ml x 1   Entered and Authorized by:   Vassie Loll MD   Signed by:   Vassie Loll MD on 07/17/2009   Method used:   Telephoned to ...       [facility]  [address] [id number] (retail)       [address]       [place], [place]  [address]       Ph: [phone] or [phone]       Fax: [phone]   RxID:   [id number]   Appended Document: refill/ hla called to pharm and pt notified, states she will keep 6/20 appt

## 2010-03-11 NOTE — Progress Notes (Signed)
Summary: refill/ hla  Phone Note Refill Request Message from:  Patient on December 24, 2009 11:08 AM  Refills Requested: Medication #1:  CLARITIN 10 MG TABS Take 1 tablet by mouth once a day   Dosage confirmed as above?Dosage Confirmed Initial call taken by: Marin Roberts RN,  December 24, 2009 11:08 AM  Follow-up for Phone Call        Refill approved-nurse to complete    Prescriptions: CLARITIN 10 MG TABS (LORATADINE) Take 1 tablet by mouth once a day  #31 x 6   Entered and Authorized by:   Vassie Loll MD   Signed by:   Vassie Loll MD on 12/24/2009   Method used:   Electronically to        [facility] [id number] (retail)       [address] [place], [place]  [address]       Ph: [phone]       Fax: [phone]   RxID:   [id number]

## 2010-03-11 NOTE — Progress Notes (Signed)
Summary: refill/ hla  Phone Note Refill Request Message from:  Patient on October 15, 2009 10:47 AM  Refills Requested: Medication #1:  VICODIN 5-500 MG TABS Take 1 tablet every 8 hours as needed for severe pain..   Dosage confirmed as above?Dosage Confirmed   Last Refilled: 9/1 pt could not get appt until mon, 9/12, will be w/ dr Arvilla Market, scheduled december w/ dr Gwenlyn Perking  Initial call taken by: Marin Roberts RN,  October 15, 2009 11:02 AM  Follow-up for Phone Call        Refill approved-nurse to complete; will provide prescription for 40 tabs; she can get more during her appointment with Dr. Arvilla Market after evaluation is completed if neededl.    Prescriptions: VICODIN 5-500 MG TABS (HYDROCODONE-ACETAMINOPHEN) Take 1 tablet every 8 hours as needed for severe pain.  #40 x 0   Entered and Authorized by:   Vassie Loll MD   Signed by:   Vassie Loll MD on 10/15/2009   Method used:   Telephoned to ...       [facility] [id number] (retail)       [address] [place], [place]  [address]       Ph: [phone]       Fax: [phone]   RxID:   [id number]

## 2010-03-11 NOTE — Progress Notes (Signed)
Summary: phone/gg  Phone Note Call from Patient   Caller: Patient Summary of Call: Pt called c/o thrush in mouth  and would like med called in for her.   She has been on steroids and inhalers. she was seen in clinic on 3/24 and treated for sinusitis with AVELOX . Will you call in Rx for this Pt # [phone] Initial call taken by: Merrie Roof RN,  May 03, 2009 4:10 PM  Follow-up for Phone Call        Pt on ABX, oral steroids, and inhaled steroids so story of thrush is likely.  Please let pt know to cont nystatin for 2 days after the oral steroids are gone.  IF she has dentures, let us know so we can instruct her on how to clean them to prevent reinfection.   Follow-up by: Blanch Media MD,  May 03, 2009 4:46 PM  Additional Follow-up for Phone Call Additional follow up Details #1::        Pt informed of above and Patient/caller verbalizes understanding of these instructions.  Additional Follow-up by: Merrie Roof RN,  May 03, 2009 5:12 PM    New/Updated Medications: NYSTATIN 100000 UNIT/ML SUSP (NYSTATIN) 5 mL (1 teaspoon) swish and swallow four times a day to treat thrush. Prescriptions: NYSTATIN 100000 UNIT/ML SUSP (NYSTATIN) 5 mL (1 teaspoon) swish and swallow four times a day to treat thrush.  #1 bottle x 0   Entered and Authorized by:   Blanch Media MD   Signed by:   Blanch Media MD on 05/03/2009   Method used:   [facility]        [facility]  [address] [id number]       [address]       [place], [place]  [address]       Ph: [phone] or [phone]       Fax: [phone]   RxID:   [id number]
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
## 2010-03-11 NOTE — Miscellaneous (Signed)
[facility]: [facility]   Imported By: Margie Billet 08/09/2009 10:24:11  _____________________________________________________________________  External Attachment:    Type:   Image     Comment:   External Document

## 2010-03-11 NOTE — Progress Notes (Signed)
Summary: phone note-thrush/gp  Phone Note Call from Patient   Caller: Patient Summary of Call: Pt.states she starting to have symptoms of thrush; "bad taste" in her mouth and "raised aeas" inside of her lips.  States she has had thrush before and was given Fluconazole. Please advise. Pharmacy-[facility].  Thanks Initial call taken by: Chinita Pester RN,  September 05, 2009 9:07 AM  Follow-up for Phone Call        Will prescribed 3 days of fluconazole; please advice patient to make sure she brush and rinse her mouth after the use of her Advair.    New/Updated Medications: FLUCONAZOLE 200 MG TABS (FLUCONAZOLE) Take 1 tablet by mouth once a day Prescriptions: FLUCONAZOLE 200 MG TABS (FLUCONAZOLE) Take 1 tablet by mouth once a day  #3 x 0   Entered and Authorized by:   Vassie Loll MD   Signed by:   Vassie Loll MD on 09/05/2009   Method used:   Electronically to        [facility] [id number] (retail)       [address] [place], [place]  [address]       Ph: [phone]       Fax: [phone]   RxID:   [id number]   Appended Document: phone note-thrush/gp Pt. was called; message left about Fluconazole Rx and to brush/rinse mouth after using Advair per Dr. Gwenlyn Perking.

## 2010-03-11 NOTE — Progress Notes (Signed)
Summary: phone/gg  Phone Note Call from Patient   Caller: Patient Summary of Call: Call from pt stating she has developed thrush,  in mouth after taking antibiotic.  SHe would [facility] [address]  wants pill not mouthwash. Pt was seen in ED on 5/20 for eval of swelling legs and feet.  She for negative for DVT and was seen by clinic MD before d/c. Pt is crying on phone and very upset by care received from clinic MD. She would like to know who it was as she did not get his name. States he looked at foot and said it was "not to bad".  He did not change any meds or do anything for her.  Today leg from mid-calf to foot  still swollen, this is unusual for her. Swelling is the same as it was in ED. She has been SOB for 6 weeks ( hx of COPD)   so that has not changed.  I have scheduled her for ED f/u on  5/25.  Is this okay?   Initial call taken by: Merrie Roof RN,  Jul 01, 2009 10:23 AM  Follow-up for Phone Call        Appt ok on 25th since this seems to be an ongoing process-I reviewed labs, dopplers. Needs eval- some concern for LLL airspace disease and the CT results from 06/09/2009. These need to be addressed ASAP-possible infectious process. She will see Dr. Onalee Hua. Follow-up by: Julaine Fusi  DO,  Jul 01, 2009 11:33 AM

## 2010-03-11 NOTE — Progress Notes (Signed)
Summary: refill/gg  Phone Note Refill Request  on October 10, 2009 2:13 PM  Refills Requested: Medication #1:  VICODIN 5-500 MG TABS Take 1 tablet every 8 hours as needed for severe pain..   Last Refilled: 08/28/2009  Method Requested: Telephone to Pharmacy Initial call taken by: Merrie Roof RN,  October 10, 2009 2:14 PM  Follow-up for Phone Call        Vicodin was given for acute tx of her knee pain; if pain continues would be better to evaluate her and determined if no further actions (other than pain killers) is needed.  I will authorize just 20 tablets so she can tolerate discomfort until appointment/evaluation is completed.  Thanks!!!!    Prescriptions: VICODIN 5-500 MG TABS (HYDROCODONE-ACETAMINOPHEN) Take 1 tablet every 8 hours as needed for severe pain.  #20 x 0   Entered and Authorized by:   Vassie Loll MD   Signed by:   Vassie Loll MD on 10/10/2009   Method used:   Telephoned to ...       [facility] [id number] (retail)       [address] [place], [place]  [address]       Ph: [phone]       Fax: [phone]   RxID:   [id number]
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
## 2010-03-11 NOTE — Progress Notes (Signed)
Summary: refill/ hla  Phone Note Refill Request Message from:  Patient on November 18, 2009 9:16 AM  Refills Requested: Medication #1:  VICODIN 5-500 MG TABS Take 1 tablet every 8 hours as needed for severe pain..   Dosage confirmed as above?Dosage Confirmed   Last Refilled: 9/16 last visit 9/12  Initial call taken by: Marin Roberts RN,  November 18, 2009 9:16 AM  Follow-up for Phone Call        Refill approved-nurse to complete    Prescriptions: VICODIN 5-500 MG TABS (HYDROCODONE-ACETAMINOPHEN) Take 1 tablet every 8 hours as needed for severe pain.  #40 x 0   Entered and Authorized by:   Vassie Loll MD   Signed by:   Vassie Loll MD on 11/19/2009   Method used:   Telephoned to ...       [facility] [id number] (retail)       [address] [place], [place]  [address]       Ph: [phone]       Fax: [phone]   RxID:   [id number]

## 2010-03-11 NOTE — Progress Notes (Signed)
Summary: records/ hla  Phone Note Call from Patient   Summary of Call: pt calls to check on appt and what is needed for [facility], call transferred to [facility]. Initial call taken by: Marin Roberts RN,  August 19, 2009 10:04 AM

## 2010-03-11 NOTE — Progress Notes (Signed)
Summary: refill/gg  Phone Note Refill Request  on December 23, 2009 2:56 PM  Refills Requested: Medication #1:  VICODIN 5-500 MG TABS Take 1 tablet every 8 hours as needed for severe pain..   Last Refilled: 11/19/2009  Method Requested: Telephone to Pharmacy Initial call taken by: Merrie Roof RN,  December 23, 2009 4:07 PM  Follow-up for Phone Call        Refill approved-nurse to complete. Patient is going to need an official pain contract before she recieved any further refills.    Prescriptions: VICODIN 5-500 MG TABS (HYDROCODONE-ACETAMINOPHEN) Take 1 tablet every 8 hours as needed for severe pain.  #40 x 0   Entered and Authorized by:   Vassie Loll MD   Signed by:   Vassie Loll MD on 12/24/2009   Method used:   Telephoned to ...       [facility] [id number] (retail)       [address] [place], [place]  [address]       Ph: [phone]       Fax: [phone]   RxID:   [id number]   Appended Document: refill/gg called to pharm

## 2010-03-11 NOTE — Progress Notes (Signed)
Summary: thrush/ hla  Phone Note Refill Request Message from:  Patient on November 08, 2009 1:43 PM  pt states she has thrush, needs a script sent in for it  Initial call taken by: Marin Roberts RN,  November 08, 2009 1:45 PM  Follow-up for Phone Call        prescription for fluconazole sent to pharmacy.    New/Updated Medications: DIFLUCAN 100 MG TABS (FLUCONAZOLE) Take 1 tablet by mouth once a day Prescriptions: DIFLUCAN 100 MG TABS (FLUCONAZOLE) Take 1 tablet by mouth once a day  #7 x 0   Entered and Authorized by:   Vassie Loll MD   Signed by:   Vassie Loll MD on 11/09/2009   Method used:   Electronically to        [facility] [id number] (retail)       [address] [place], [place]  [address]       Ph: [phone]       Fax: [phone]   RxID:   [id number]

## 2010-03-11 NOTE — Progress Notes (Signed)
Summary: Swelling  Phone Note Outgoing Call   Call placed by: Angelina Ok RN,  Jun 28, 2009 11:05 AM Call placed to: Patient Summary of Call: Call to pt informed that she will need to go to the ER for evaluation for a possible DVT/PE.  Pt sia that she will get someone to take her today as soon as possible.Angelina Ok RN  Jun 28, 2009 11:06 AM  Initial call taken by: Angelina Ok RN,  Jun 28, 2009 11:07 AM  Follow-up for Phone Call        That sounds good. Follow-up by: Blondell Reveal MD,  Jun 28, 2009 11:10 AM

## 2010-03-11 NOTE — Progress Notes (Signed)
Summary: change meds/ hla  Phone Note Call from Patient   Summary of Call: pt calls requesting change in meds, states meds now may have helped a small amount but not much, requests a call from dr [name] at [phone] Initial call taken by: Marin Roberts RN,  May 29, 2009 4:34 PM  Follow-up for Phone Call        called patient at home to discuss the medication regimen, no answer, I have left the message to call us back if needed  thanks isrka Follow-up by: Mliss Sax MD,  May 30, 2009 12:12 PM

## 2010-03-11 NOTE — Assessment & Plan Note (Signed)
Summary: ED f/u/gg   Vital Signs:  Patient profile:   66 year old female Height:      65 inches (165.10 cm) Weight:      232.0 pounds (105.45 kg) BMI:     38.75 O2 Sat:      92 % on Room air Temp:     98.2 degrees F (36.78 degrees C) oral Pulse rate:   70 / minute BP sitting:   119 / 64  (left arm)  Vitals Entered By: Chinita Pester RN (Jul 03, 2009 8:42 AM)  O2 Flow:  Room air CC: ED f/u visit for swelling of legs and SOB. Is Patient Diabetic? No Pain Assessment Patient in pain? no      Nutritional Status BMI of > 30 = obese  Have you ever been in a relationship where you felt threatened, hurt or afraid?Unable to ask; daughter w/pt.  Comments O2 @ 2l at home   Primary Care Provider:  Vassie Loll MD  CC:  ED f/u visit for swelling of legs and SOB.Marland Kitchen  History of Present Illness: Kelsey Garcia is a 65 yo woman with PMH as outlined in chart.  She is here for ED f/u for lower extremity swelling.  She is here with continued swelling, virtually no pain.  She was seen in ED with all labs and lower extremity dopplers wnl.  She reports that she thinks its related to her CHF.  Reports marked sob with exertion only.  stable 3 pillow orthopnea.  no PND.  No chest pain, some palpitations with her coughing.     Depression History:      The patient denies a depressed mood most of the day and a diminished interest in her usual daily activities.         Preventive Screening-Counseling & Management  Alcohol-Tobacco     Alcohol drinks/day: 0     Smoking Status: current     Smoking Cessation Counseling: yes     Smoke Cessation Stage: contemplative     Packs/Day: aprox 1 cig daily     Year Started: 1979     Passive Smoke Exposure: yes  Caffeine-Diet-Exercise     Does Patient Exercise: no  Current Medications (verified): 1)  Aspirin 81 Mg Tbec (Aspirin) .... Take 1 Tablet By Mouth Once A Day 2)  Prozac 40 Mg Caps (Fluoxetine Hcl) .... Take 1 Capsule By Mouth Once A Day 3)   Hydrochlorothiazide 25 Mg Tabs (Hydrochlorothiazide) .... Take 1 Tablet By Mouth Once A Day 4)  Tiazac 420 Mg Xr24h-Cap (Diltiazem Hcl Er Beads) .... Take 1capsule By Mouth Once A Day 5)  Claritin 10 Mg Tabs (Loratadine) .... Take 1 Tablet By Mouth Once A Day 6)  Advair Diskus 500-50 Mcg/dose Aepb (Fluticasone-Salmeterol) .Marland Kitchen.. 1 Puff Inhaled Two Times A Day 7)  Combivent 18-103 Mcg/act Aero (Ipratropium-Albuterol) .Marland Kitchen.. 1-2 Puff Every 6 Hours As Needed For Sob and Wheezing. 8)  Spiriva Handihaler 18 Mcg Caps (Tiotropium Bromide Monohydrate) .Marland Kitchen.. 1 Cao Inhaled Daily. 9)  Percocet 5-325 Mg Tabs (Oxycodone-Acetaminophen) .... Take 1 Tablet Every 6 Hours As Needed For Pain 10)  Voltaren 1 % Gel (Diclofenac Sodium) .... Apply To The Affected Area 2-3 Times Per Day As Needed For Pain 11)  Hydromet 5-1.5 Mg/53ml Syrp (Hydrocodone-Homatropine) .... Use 5 Ml Evey 6 Hours As Needed For Cough. 12)  Mucinex 600 Mg Xr12h-Tab (Guaifenesin) .... Take 1 Tablet By Mouth Two Times A Day  Allergies (verified): 1)  ! Benadryl 2)  !  Sudafed 3)  ! Ra Nite Time Cough (Doxylamine-Dm) 4)  ! Tramadol Hcl  Past History:  Past Surgical History: Last updated: 12/29/2005 Tubal ligation  Family History: Last updated: 11/26/2008 Family History Asthma (daughter had when little - not anymore) Family History Emphysema (mother in old age) Cancer (brain, breast, liver, skin) Allergies  Social History: Last updated: 04/02/2009 Patient is a current smoker. Pt states smokes 1 cigarette a day. Pt is separated, lives with sister and family.  Risk Factors: Alcohol Use: 0 (07/03/2009) Exercise: no (07/03/2009)  Risk Factors: Smoking Status: current (07/03/2009) Packs/Day: aprox 1 cig daily (07/03/2009) Passive Smoke Exposure: yes (07/03/2009)  Review of Systems      See HPI  Physical Exam  General:  obese, alert, cooperative, NAD Eyes:  pupils equal, pupils round, and pupils reactive to light.  anicteric Neck:   no JVD Lungs:  very distant sounds. prolonged exp exp wheezing bibasilar crackles Heart:  very distant irregular, normal rate no obvious murmurs or gallops in light of distant sounds No JVD Good cap refill Abdomen:  soft, non-tender, and normal bowel sounds.   Extremities:  +2 bilateral edema Neurologic:  alert & oriented X3, cranial nerves II-XII intact, strength normal in all extremities, and gait normal.   Psych:  pleasant Oriented X3, memory intact for recent and remote, and normally interactive.     Impression & Recommendations:  Problem # 1:  LEG EDEMA, BILATERAL (ICD-782.3) Likely multifactorial including CHF (maybe right sided component with advanced COPD) and venous stasis. Will change to lasix for added natriuresis and compression stockings.  Her updated medication list for this problem includes:    Furosemide 40 Mg Tabs (Furosemide) .Marland Kitchen... Take 1 tablet by mouth once a day  > 40 min face to face  Problem # 2:  CARDIOMYOPATHY (ICD-425.4) slightly volume expanded and well perfused will change to lasix as above  Of note, last echo 2010 with normal EF and tricuspid regurgitant velocity of 221.  Based on these findings, I assume that if there is any CHF it is diastoic as LVF is normal and PAP not elevated.  Problem # 3:  COPD (ICD-496) on spiriva, will change combivent to ventolin as there is no benefit to as needed atrovent and will increase anticholinergic side effects  Her updated medication list for this problem includes:    Advair Diskus 500-50 Mcg/dose Aepb (Fluticasone-salmeterol) .Marland Kitchen... 1 puff inhaled two times a day    Ventolin Hfa 108 (90 Base) Mcg/act Aers (Albuterol sulfate) .Marland Kitchen... 1-2 puffs every 4 hours as needed for shorthness of breath    Spiriva Handihaler 18 Mcg Caps (Tiotropium bromide monohydrate) .Marland Kitchen... 1 cao inhaled daily.  Problem # 4:  ATRIAL FIBRILLATION (ICD-427.31) [place] currently 1 (no CHF, has HTN, age <75, no DM, no CVA) rate controlled, on  ASA  Her updated medication list for this problem includes:    Aspirin 81 Mg Tbec (Aspirin) .Marland Kitchen... Take 1 tablet by mouth once a day    Tiazac 420 Mg Xr24h-cap (Diltiazem hcl er beads) .Marland Kitchen... Take 1capsule by mouth once a day  Problem # 5:  HYPERTENSION (ICD-401.9)  changed HCTZ to lasix as above  Her updated medication list for this problem includes:    Furosemide 40 Mg Tabs (Furosemide) .Marland Kitchen... Take 1 tablet by mouth once a day    Tiazac 420 Mg Xr24h-cap (Diltiazem hcl er beads) .Marland Kitchen... Take 1capsule by mouth once a day  BP today: 119/64 Prior BP: 118/69 (06/26/2009)  Labs Reviewed: K+: 4.4 (06/27/2009)  Creat: : 0.80 (06/27/2009)   Chol: 182 (05/24/2009)   HDL: 43 (05/24/2009)   LDL: 126 (05/24/2009)   TG: 66 (05/24/2009)  Future Orders: T-Basic Metabolic Panel [id number]) ... 07/09/2009  Problem # 6:  HYPERLIPIDEMIA (ICD-272.4) LDL appears to be at goal based on my knowledge of pt. Will have PCP follow up as lipids were recently checked  Labs Reviewed: SGOT: 10 (07/20/2008)   SGPT: <8 U/L (07/20/2008)   HDL:43 (05/24/2009), 45 (11/26/2008)  LDL:126 (05/24/2009), 112 (11/26/2008)  Chol:182 (05/24/2009), 181 (11/26/2008)  Trig:66 (05/24/2009), 119 (11/26/2008)  Complete Medication List: 1)  Aspirin 81 Mg Tbec (Aspirin) .... Take 1 tablet by mouth once a day 2)  Prozac 40 Mg Caps (Fluoxetine hcl) .... Take 1 capsule by mouth once a day 3)  Furosemide 40 Mg Tabs (Furosemide) .... Take 1 tablet by mouth once a day 4)  Tiazac 420 Mg Xr24h-cap (Diltiazem hcl er beads) .... Take 1capsule by mouth once a day 5)  Claritin 10 Mg Tabs (Loratadine) .... Take 1 tablet by mouth once a day 6)  Advair Diskus 500-50 Mcg/dose Aepb (Fluticasone-salmeterol) .Marland Kitchen.. 1 puff inhaled two times a day 7)  Ventolin Hfa 108 (90 Base) Mcg/act Aers (Albuterol sulfate) .Marland Kitchen.. 1-2 puffs every 4 hours as needed for shorthness of breath 8)  Spiriva Handihaler 18 Mcg Caps (Tiotropium bromide monohydrate) .Marland Kitchen.. 1 cao  inhaled daily. 9)  Percocet 5-325 Mg Tabs (Oxycodone-acetaminophen) .... Take 1 tablet every 6 hours as needed for pain 10)  Voltaren 1 % Gel (Diclofenac sodium) .... Apply to the affected area 2-3 times per day as needed for pain 11)  Hydromet 5-1.5 Mg/19ml Syrp (Hydrocodone-homatropine) .... Use 5 ml evey 6 hours as needed for cough. 12)  Mucinex 600 Mg Xr12h-tab (Guaifenesin) .... Take 1 tablet by mouth two times a day 13)  K-lor 20 Meq Pack (Potassium chloride) .... Take 2 tablets by mouth daily  Patient Instructions: 1)  Please schedule a follow-up appointment in 1 month. 2)  Stop hydrochlorothiazide 3)  Start lasix (furosemide) in the morning as prescribed. 4)  Start potassium as prescribed. 5)  Stop combivent and start ventolin (albuterol) as prescribed. 6)  Use compression stockings as discussed. 7)  Will need to recheck labs in 1 week. 8)  If you have any problems, call clinic.  Prescriptions: K-LOR 20 MEQ PACK (POTASSIUM CHLORIDE) take 2 tablets by mouth daily  #60 x 0   Entered and Authorized by:   Mariea Stable MD   Signed by:   Mariea Stable MD on 07/03/2009   Method used:   [facility]ron[facility] to        [facility]  [address] [id number]       [address]       [place], [place]  [address]       Ph: [phone] or [phone]       Fax: [phone]   RxID:   [id number] VENTOLIN HFA 108 (90 BASE) MCG/ACT AERS (ALBUTEROL SULFATE) 1-2 puffs every 4 hours as needed for shorthness of breath  #1 x 11   Entered and Authorized by:   Mariea Stable MD   Signed by:   Mariea Stable MD on 07/03/2009   Method used:   [facility]        [facility]  [address] [id number]       [address]       [facility] [place], [place]  [address]       Ph: [phone] or [phone]       Fax: [phone]   RxID:   [id number] FUROSEMIDE 40 MG TABS (FUROSEMIDE) Take 1 tablet by mouth once a day  #30 x 0   Entered and Authorized by:    Mariea Stable MD   Signed by:   Mariea Stable MD on 07/03/2009   Method used:   [facility]        [facility]  [address] [id number]       [address]       [place], [place]  [address]       Ph: [phone] or [phone]       Fax: [phone]   RxID:   [id number]   Prevention & Chronic Care Immunizations   Influenza vaccine: Not documented   Influenza vaccine deferral: Refused  (10/25/2008)    Tetanus booster: Not documented   Td booster deferral: Deferred  (10/25/2008)    Pneumococcal vaccine: Not documented   Pneumococcal vaccine deferral: Refused  (01/22/2009)    H. zoster vaccine: Not documented   H. zoster vaccine deferral: Deferred  (10/25/2008)  Colorectal Screening   Hemoccult: Not documented   Hemoccult action/deferral: Refused  (10/25/2008)    Colonoscopy: Not documented   Colonoscopy action/deferral: Refused  (11/26/2008)  Other Screening   Pap smear: Not documented   Pap smear action/deferral: Deferred  (10/25/2008)    Mammogram: ASSESSMENT: Negative - BI-RADS 1^MM DIGITAL SCREENING  (10/30/2008)   Mammogram action/deferral: No specific mammographic evidence of malignancy.  Screening mammogram in 1 year.     (10/30/2008)   Mammogram due: 11/2009    DXA bone density scan: Not documented   Smoking status: current  (07/03/2009)   Smoking cessation counseling: yes  (07/03/2009)  Lipids   Total Cholesterol: 182  (05/24/2009)   Lipid panel action/deferral: Lipid Panel ordered   LDL: 126  (05/24/2009)   LDL Direct: Not documented   HDL: 43  (05/24/2009)   Triglycerides: 66  (05/24/2009)    SGOT (AST): 10  (07/20/2008)   SGPT (ALT): <8 U/L  (07/20/2008)   Alkaline phosphatase: 71  (07/20/2008)   Total bilirubin: 0.3  (07/20/2008)    Lipid flowsheet reviewed?: Yes   Progress toward LDL goal: Unchanged  Hypertension   Last Blood Pressure: 119 / 64  (07/03/2009)   Serum creatinine: 0.80  (06/27/2009)   Serum  potassium 4.4  (06/27/2009)    Hypertension flowsheet reviewed?: Yes   Progress toward BP goal: At goal  Self-Management Support :   Personal Goals (by the next clinic visit) :      Personal blood pressure goal: 140/90  (10/25/2008)     Personal LDL goal: 100  (06/26/2009)    Patient will work on the following items until the next clinic visit to reach self-care goals:     Medications and monitoring: bring all of my medications to every visit  (07/03/2009)     Eating: eat more vegetables, use fresh or frozen vegetables, eat foods that are low in salt, eat baked foods instead of fried foods  (07/03/2009)     Activity: take a 30 minute walk every day  (06/14/2009)    Hypertension self-management support: Written self-care plan  (07/03/2009)   Hypertension self-care plan printed.    Lipid self-management support: Written self-care plan  (07/03/2009)   Lipid self-care plan printed.  Process Orders Check Orders Results:     [facility]: ABN not required for this insurance Tests Sent for requisitioning (Jul 03, 2009 9:39 AM):     07/09/2009: [facility] -- T-Basic Metabolic Panel [phone] (signed)

## 2010-03-11 NOTE — Progress Notes (Signed)
Summary: refill/ hla  Phone Note Refill Request Message from:  Patient on October 02, 2009 1:48 PM  Refills Requested: Medication #1:  HYDROMET 5-1.5 MG/5ML SYRP Use 5 ml evey 6 hours as needed for cough.   Dosage confirmed as above?Dosage Confirmed   Last Refilled: 7/20 Initial call taken by: Marin Roberts RN,  October 02, 2009 1:48 PM  Follow-up for Phone Call        Refill approved-nurse to complete    Prescriptions: HYDROMET 5-1.5 MG/5ML SYRP (HYDROCODONE-HOMATROPINE) Use 5 ml evey 6 hours as needed for cough.  #137ml x 3   Entered and Authorized by:   Vassie Loll MD   Signed by:   Vassie Loll MD on 10/02/2009   Method used:   Telephoned to ...       [facility] [id number] (retail)       [address] [place], [place]  [address]       Ph: [phone]       Fax: [phone]   RxID:   [id number]   Appended Document: refill/ hla called to pharm, pt informed

## 2010-03-11 NOTE — Progress Notes (Signed)
Summary: refill/gg  Phone Note Refill Request  on January 06, 2010 12:08 PM  Refills Requested: Medication #1:  FUROSEMIDE 40 MG TABS Take 1 tablet by mouth once a day  Method Requested: Electronic Initial call taken by: Merrie Roof RN,  January 06, 2010 12:08 PM  Follow-up for Phone Call        Refill approved-nurse to complete    Prescriptions: FUROSEMIDE 40 MG TABS (FUROSEMIDE) Take 1 tablet by mouth once a day  #30 Tablet x 4   Entered and Authorized by:   Vassie Loll MD   Signed by:   Vassie Loll MD on 01/06/2010   Method used:   Electronically to        [facility] [id number] (retail)       [address] [place], [place]  [address]       Ph: [phone]       Fax: [phone]   RxID:   [id number]

## 2010-03-11 NOTE — Progress Notes (Signed)
Summary: refill/gg  Phone Note Refill Request  on Jun 19, 2009 9:18 AM  Refills Requested: Medication #1:  HYDROMET 5-1.5 MG/5ML SYRP Use 5 ml evey 6 hours as needed for cough. Filled 4/26 with one refill. Talked with pt and this cough med works great for her.  Please refill   Method Requested: Telephone to Pharmacy Initial call taken by: Merrie Roof RN,  Jun 19, 2009 9:20 AM  Follow-up for Phone Call        Refill approved-nurse to complete. Will change prescription to .    Prescriptions: HYDROMET 5-1.5 MG/5ML SYRP (HYDROCODONE-HOMATROPINE) Use 5 ml evey 6 hours as needed for cough.  #122ml x 1   Entered and Authorized by:   Vassie Loll MD   Signed by:   Vassie Loll MD on 06/19/2009   Method used:   Telephoned to ...       [facility]  [address] [id number]       [address]       [place], [place]  [address]       Ph: [phone] or [phone]       Fax: [phone]   RxID:   [id number]   Appended Document: refill/gg Rx called in

## 2010-03-11 NOTE — Progress Notes (Signed)
  Phone Note Refill Request Message from:  Patient on November 08, 2009 12:33 PM  Refills Requested: Medication #1:  PROZAC 40 MG CAPS Take 1 capsule by mouth once a day  Follow-up for Phone Call        Refill approved-nurse to complete    Prescriptions: PROZAC 40 MG CAPS (FLUOXETINE HCL) Take 1 capsule by mouth once a day  #30 x 5   Entered and Authorized by:   Vassie Loll MD   Signed by:   Vassie Loll MD on 11/09/2009   Method used:   Electronically to        [facility] [id number] (retail)       [address] [place], [place]  [address]       Ph: [phone]       Fax: [phone]   RxID:   [id number]

## 2010-03-11 NOTE — Progress Notes (Signed)
Summary: refill/gg  Phone Note Refill Request  on June 04, 2009 9:24 AM  request for hydrocodone-homatropine syrup last filled 05/20/09     Pt states she uses for allergies and has recieved this several times before.   Method Requested: Telephone to Pharmacy Initial call taken by: Merrie Roof RN,  June 04, 2009 9:24 AM  Follow-up for Phone Call        I spoke with patient and her main problem is the cough; which is chronic but exacerbated time to time. Would be ok to refill her cough syrup.    New/Updated Medications: HYDROMET 5-1.5 MG/5ML SYRP (HYDROCODONE-HOMATROPINE) Use 5 ml evey 6 hours as needed for cough. Prescriptions: HYDROMET 5-1.5 MG/5ML SYRP (HYDROCODONE-HOMATROPINE) Use 5 ml evey 6 hours as needed for cough.  #7ml x 1   Entered and Authorized by:   Vassie Loll MD   Signed by:   Vassie Loll MD on 06/04/2009   Method used:   Telephoned to ...       [facility]  [address] [id number]       [address]       [place], [place]  [address]       Ph: [phone] or [phone]       Fax: [phone]   RxID:   [id number]   Appended Document: refill/gg called to pharm  Appended Document: refill/gg Rx faxed to pharmacy

## 2010-03-11 NOTE — Progress Notes (Signed)
Summary: refill/ hla  Phone Note Refill Request Message from:  Patient on April 03, 2009 2:16 PM  Refills Requested: Medication #1:  HYDROCHLOROTHIAZIDE 25 MG TABS Take 1 tablet by mouth once a day   Dosage confirmed as above?Dosage Confirmed   Last Refilled: 03/05/2009 last visit 2/22  Initial call taken by: Marin Roberts RN,  April 03, 2009 2:16 PM  Follow-up for Phone Call        Refill approved-nurse to complete    Prescriptions: HYDROCHLOROTHIAZIDE 25 MG TABS (HYDROCHLOROTHIAZIDE) Take 1 tablet by mouth once a day  #31 x 8   Entered and Authorized by:   Vassie Loll MD   Signed by:   Vassie Loll MD on 04/03/2009   Method used:   [facility]        [facility]  [address] [id number]       [address]       [place], [place]  [address]       Ph: [phone] or [phone]       Fax: [phone]   RxID:   [id number]

## 2010-03-11 NOTE — Progress Notes (Signed)
Summary: Swelling  Phone Note Call from Patient   Caller: Patient Call For: Vassie Loll MD Summary of Call: Symptoms: Swelling in right leg and ankles.  Pt was asked to touch her leg-finger print does not remain.  Has gone down some since yesterday. Characteristics:  Everything else is working voiding ok Location: right leg and ankles Onset: Started early yesterday Aggravating Factors:Has Congestive Heart Failure Relieving Factors: Putting legs up does not help on yesterday History: Has Congestive Heart Failure.  Has had pain in the leg Level of Urgency:  Has a doctors appointment. Nursing Diagnosis/Chief Complaint: Swelling in right legs and ankle Suggested Follow Up To Physician:  Patient Education: Contingency Plan: Discuss with physician. Follow Up Response:  Initial call taken by: Angelina Ok RN,  May 17, 2009 11:13 AM  Follow-up for Phone Call        I could not verify the h/o CHF.  ECHO sep 2010 had nl EF and no increased LV wall thickness.  Last exam had no LE edema.  Pt needs to weigh self daily, first thing in AM after voiding, record in notebook and bring in to appt. Pt not on duirectics.  She should keep her Monday appt.  TO Er / UC  if worse or if breathing is affected.   Follow-up by: Blanch Media MD,  May 17, 2009 12:51 PM  Additional Follow-up for Phone Call Additional follow up Details #1::        Pt was called and informed of plan to do daily weights and to bring in at visit on Monday.  If shortness of breath needs to go to the ER or Urgent Care.  Pt voiced understanding of plan. Additional Follow-up by: Angelina Ok RN,  May 17, 2009 1:40 PM

## 2010-03-11 NOTE — Progress Notes (Signed)
Summary: refill/ hla  Phone Note Refill Request Message from:  Patient on August 13, 2009 5:37 PM  Refills Requested: Medication #1:  HYDROMET 5-1.5 MG/5ML SYRP Use 5 ml evey 6 hours as needed for cough.   Last Refilled: 6/18 Initial call taken by: Marin Roberts RN,  August 13, 2009 5:37 PM  Follow-up for Phone Call        The last time that Dr. Gwenlyn Perking refilled it, he said he would not refill it again. He said that she always has a cough and that she would need to come in so that her cough could be reevaluated. Follow-up by: Zoila Shutter MD,  August 16, 2009 11:28 AM

## 2010-03-11 NOTE — Assessment & Plan Note (Signed)
Summary: feels like she's not getting enough air/pcp-Oluwatoni Rotunno/hla   Primary Care Provider:  Vassie Loll MD   History of Present Illness: Pt was here but she has to leave before being seen. do not charge her for this visit.  Allergies: 1)  ! Benadryl 2)  ! Sudafed 3)  ! Ra Nite Time Cough (Doxylamine-Dm) 4)  ! Tramadol Hcl   Complete Medication List: 1)  Aspirin 325 Mg Tabs (Aspirin) .... Take 1 tablet by mouth once a day 2)  Prozac 40 Mg Caps (Fluoxetine hcl) .... Take 1 capsule by mouth once a day 3)  Hydrochlorothiazide 25 Mg Tabs (Hydrochlorothiazide) .... Take 1 tablet by mouth once a day 4)  Tiazac 420 Mg Xr24h-cap (Diltiazem hcl er beads) .... Take 1capsule by mouth once a day 5)  Loratadine 10 Mg Tabs (Loratadine) .... Take 1 tablet by mouth once a day 6)  Ventolin Hfa 108 (90 Base) Mcg/act Aers (Albuterol sulfate) .Marland Kitchen.. 1-2 puff every 4 hours as needed for wheezing or shortness of breath. 7)  Symbicort 160-4.5 Mcg/act Aero (Budesonide-formoterol fumarate) .... Take 2 puffs orally twice a day. 8)  Atrovent Hfa 17 Mcg/act Aers (Ipratropium bromide hfa) .... Inhale 2 puffs everyday in the morning. 9)  Mucinex 600 Mg Xr12h-tab (Guaifenesin) .... Take one pill once in the morning.  Other Orders: No Charge Patient Arrived (NCPA0) (NCPA0)

## 2010-03-11 NOTE — Progress Notes (Signed)
Summary: Refill  Phone Note Refill Request Message from:  Fax from Pharmacy on October 25, 2009 10:35 AM  Refills Requested: Medication #1:  VICODIN 5-500 MG TABS Take 1 tablet every 8 hours as needed for severe pain.. Pt has a ride today.  Can she get a refill today.  Pt said it may be 2 weeks before she can get free transportation.  Wants to have the medication on hand.   Method Requested: Electronic Initial call taken by: Angelina Ok RN,  October 25, 2009 10:35 AM  Follow-up for Phone Call        Refill approved-nurse to complete. Follow-up by: Margarito Liner MD,  October 25, 2009 4:29 PM  Additional Follow-up for Phone Call Additional follow up Details #1::        Rx called to pharmacy Additional Follow-up by: Angelina Ok RN,  October 28, 2009 9:51 AM    Prescriptions: VICODIN 5-500 MG TABS (HYDROCODONE-ACETAMINOPHEN) Take 1 tablet every 8 hours as needed for severe pain.  #40 x 0   Entered and Authorized by:   Margarito Liner MD   Signed by:   Margarito Liner MD on 10/25/2009   Method used:   Telephoned to ...       [facility] [id number] (retail)       [address] [place], [place]  [address]       Ph: [phone]       Fax: [phone]   RxID:   [id number]

## 2010-03-11 NOTE — Letter (Signed)
Summary: [facility]THOTIC [facility] PROSTHETIC [facility]ICES  ORTHOTIC AND PROSTHETIC DEVICES   Imported By: Margie Billet 05/21/2009 12:10:51  _____________________________________________________________________  External Attachment:    Type:   Image     Comment:   External Document

## 2010-03-11 NOTE — Progress Notes (Signed)
Summary: refill/gg  Phone Note Refill Request  on Jul 01, 2009 2:46 PM  Pt feels she has thrush in her mouth  after taking antibiotic and would like  a pill for this doxy was given on 5/6   Method Requested: Electronic Initial call taken by: Merrie Roof RN,  Jul 01, 2009 2:46 PM  Follow-up for Phone Call        she is going to be seen on the 25th, at that moment appropriate treatment can be given for her mouth discomfort. Normally you do not develop thrush, for the use of antibiotics. It could be something different.

## 2010-03-11 NOTE — Progress Notes (Signed)
Summary: phone/gg  Phone Note Call from Patient   Caller: Patient Summary of Call: Pt called asking for pill to help with the thrush in her mouth.  She was seen last week in clinic for this and forgot to mention it.  She states she has Just come  off steroid, advair and antibiotics.   She has bad taste in mouth and white patches in cheeks. She has used diflucan in past with good results.   Initial call taken by: Merrie Roof RN,  July 10, 2009 10:55 AM  Follow-up for Phone Call        diflucan 200mg  once daily x 5 day. Follow-up by: Mariea Stable MD,  July 10, 2009 12:18 PM    New/Updated Medications: FLUCONAZOLE 200 MG TABS (FLUCONAZOLE) Take 1 tablet by mouth once a day Prescriptions: FLUCONAZOLE 200 MG TABS (FLUCONAZOLE) Take 1 tablet by mouth once a day  #5 x 0   Entered and Authorized by:   Mariea Stable MD   Signed by:   Mariea Stable MD on 07/10/2009   Method used:   [facility] to        [facility]  [address] [id number]       [address]       [place], [place]  [address]       Ph: [phone] or [phone]       Fax: [phone]   RxID:   [id number]

## 2010-03-11 NOTE — Assessment & Plan Note (Signed)
Summary: recheck of cough/gg   Vital Signs:  Patient profile:   65 year old female Height:      65 inches (165.10 cm) Weight:      239.9 pounds (103.27 kg) BMI:     40.07 Temp:     97.0 degrees F (36.11 degrees C) oral Pulse rate:   79 / minute BP sitting:   118 / 68  (left arm) Cuff size:   large  Vitals Entered By: Theotis Barrio NT II (May 13, 2009 2:16 PM) CC: CHRONIC PAIN IN HIPS /  PAIN IN KNEES STARTED ABOUT 2-3 WEEKS AGO /  PATIENT IS HERE FOR COUGH, Is Patient Diabetic? No Pain Assessment Patient in pain? yes     Location: HIPS/KNEES Intensity:       8 Type: GRINDING Onset of pain  Chronic Nutritional Status BMI of > 30 = obese  Have you ever been in a relationship where you felt threatened, hurt or afraid?No   Does patient need assistance? Functional Status Self care Ambulation Normal Comments CHRONIC HIP PAIN / BILATERAL KNEE PAIN  / FOLLOW UP COUGH   Primary Care Provider:  Vassie Loll MD  CC:  CHRONIC PAIN IN HIPS /  PAIN IN KNEES STARTED ABOUT 2-3 WEEKS AGO /  PATIENT IS HERE FOR COUGH and .  History of Present Illness: Kelsey Garcia is a 65 year old Female with PMH/problems as outlined in the EMR, who presents to the [facility] for follow up on her chronic cough. Recently treated with a course of abx and prednisone for exacerbation of COPD. She says her cough improved a little bit but is not completely gone. She has had this problem for a long time and she says between her COPD and seasonal allergy she has never been free of cough in a long time. She does have some degree of reflux too but did not like taking PPI as it gave her "rebound reflux". She is not on ACEi. She is still trying to quit. Doesn't have any recent weight changes, hoarseness of voice.   Depression History:      The patient denies a depressed mood most of the day and a diminished interest in her usual daily activities.         Preventive Screening-Counseling & Management  Alcohol-Tobacco  Smoking Status: current     Smoking Cessation Counseling: yes     Smoke Cessation Stage: contemplative     Packs/Day: aprox 1 cig daily     Year Started: 1979     Passive Smoke Exposure: yes  Caffeine-Diet-Exercise     Does Patient Exercise: no  Current Medications (verified): 1)  Aspirin 81 Mg Tbec (Aspirin) .... Take 1 Tablet By Mouth Once A Day 2)  Prozac 40 Mg Caps (Fluoxetine Hcl) .... Take 1 Capsule By Mouth Once A Day 3)  Hydrochlorothiazide 25 Mg Tabs (Hydrochlorothiazide) .... Take 1 Tablet By Mouth Once A Day 4)  Tiazac 420 Mg Xr24h-Cap (Diltiazem Hcl Er Beads) .... Take 1capsule By Mouth Once A Day 5)  Claritin 10 Mg Tabs (Loratadine) .... Take 1 Tablet By Mouth Once A Day 6)  Advair Diskus 500-50 Mcg/dose Aepb (Fluticasone-Salmeterol) .Marland Kitchen.. 1 Puff Inhaled Two Times A Day 7)  Hydromet 5-1.5 Mg/26ml Syrp (Hydrocodone-Homatropine) .... Take 5ml Every 8 Hours As Needed For Cough 8)  Combivent 18-103 Mcg/act Aero (Ipratropium-Albuterol) .Marland Kitchen.. 1-2 Puff Every 6 Hours As Needed For Sob and Wheezing. 9)  Spiriva Handihaler 18 Mcg Caps (Tiotropium Bromide Monohydrate) .Marland KitchenMarland KitchenMarland Kitchen  1 Cao Inhaled Daily. 10)  Prednisone 10 Mg Tabs (Prednisone) .... Take 4 Tablets By Mouth For Three Days, Then 3 Tablets By Mouth For Three Days, Then 2 Tablets By Mouth For 3 Days and Then 1 Tablets By Mouth For Three Days. 11)  Nasonex 50 Mcg/act Susp (Mometasone Furoate) .... 2 Sprays Inside Each Nostril Daily. 12)  Nystatin 100000 Unit/ml Susp (Nystatin) .... 5 Ml (1 Teaspoon) Swish and Swallow Four Times A Day To Treat Thrush. 13)  Diflucan 200 Mg Tabs (Fluconazole) .... One By Mouth Once Daily For 5 Days  Allergies (verified): 1)  ! Benadryl 2)  ! Sudafed 3)  ! Ra Nite Time Cough (Doxylamine-Dm) 4)  ! Tramadol Hcl  Past History:  Past Medical History: Last updated: 11/26/2008 Atrial fibrillation (cardioversion x2) Depression Hypertension Nonischemic cardiomyopathy Obsessive compulsive  disorder Insomnia COPD  Past Surgical History: Last updated: 12/29/2005 Tubal ligation  Family History: Last updated: 11/26/2008 Family History Asthma (daughter had when little - not anymore) Family History Emphysema (mother in old age) Cancer (brain, breast, liver, skin) Allergies  Social History: Last updated: 04/02/2009 Patient is a current smoker. Pt states smokes 1 cigarette a day. Pt is separated, lives with sister and family.  Risk Factors: Exercise: no (05/13/2009)  Risk Factors: Smoking Status: current (05/13/2009) Packs/Day: aprox 1 cig daily (05/13/2009) Passive Smoke Exposure: yes (05/13/2009)  Review of Systems       As per HPI  Physical Exam  General:  alert and well-developed.   Lungs:  normal respiratory effort, bilateral good air entry, prolonged expiration, no wheeze.   Heart:  normal rate and regular rhythm.   Abdomen:  soft and non-tender.   Pulses:  normal peripheral pulses  Extremities:  no cyanosis, clubbing or edema  Neurologic:  non focal.  Psych:  normally interactive.     Impression & Recommendations:  Problem # 1:  COUGH (ICD-786.2) This is likely uncontrolled COPD, versus reflux, and definitely complicated by ongoing tobacco abuse. Although she does have risk factor for chest malignancy she had a normal CXR on last visit, so I doubt that is the case. She did have some pneumonitis on last exam, so will get a repeat CXR today to check on progression. Continue with the current COPD regimen. Tobacco cessation enforced today too. She is unwilling to have a trial of PPI for now.   Orders: CXR- 2view (CXR)  Problem # 2:  COPD (ICD-496) As above. No changes today. Completing the prednisone course.  Her updated medication list for this problem includes:    Advair Diskus 500-50 Mcg/dose Aepb (Fluticasone-salmeterol) .Marland Kitchen... 1 puff inhaled two times a day    Combivent 18-103 Mcg/act Aero (Ipratropium-albuterol) .Marland Kitchen... 1-2 puff every 6 hours as  needed for sob and wheezing.    Spiriva Handihaler 18 Mcg Caps (Tiotropium bromide monohydrate) .Marland Kitchen... 1 cao inhaled daily.  Problem # 3:  HYPERTENSION (ICD-401.9) Well-controlled. Continue the current regimen.   Her updated medication list for this problem includes:    Hydrochlorothiazide 25 Mg Tabs (Hydrochlorothiazide) .Marland Kitchen... Take 1 tablet by mouth once a day    Tiazac 420 Mg Xr24h-cap (Diltiazem hcl er beads) .Marland Kitchen... Take 1capsule by mouth once a day  Complete Medication List: 1)  Aspirin 81 Mg Tbec (Aspirin) .... Take 1 tablet by mouth once a day 2)  Prozac 40 Mg Caps (Fluoxetine hcl) .... Take 1 capsule by mouth once a day 3)  Hydrochlorothiazide 25 Mg Tabs (Hydrochlorothiazide) .... Take 1 tablet by mouth once a  day 4)  Tiazac 420 Mg Xr24h-cap (Diltiazem hcl er beads) .... Take 1capsule by mouth once a day 5)  Claritin 10 Mg Tabs (Loratadine) .... Take 1 tablet by mouth once a day 6)  Advair Diskus 500-50 Mcg/dose Aepb (Fluticasone-salmeterol) .Marland Kitchen.. 1 puff inhaled two times a day 7)  Hydromet 5-1.5 Mg/32ml Syrp (Hydrocodone-homatropine) .... Take 5ml every 8 hours as needed for cough 8)  Combivent 18-103 Mcg/act Aero (Ipratropium-albuterol) .Marland Kitchen.. 1-2 puff every 6 hours as needed for sob and wheezing. 9)  Spiriva Handihaler 18 Mcg Caps (Tiotropium bromide monohydrate) .Marland Kitchen.. 1 cao inhaled daily. 10)  Nasonex 50 Mcg/act Susp (Mometasone furoate) .... 2 sprays inside each nostril daily. 11)  Diflucan 200 Mg Tabs (Fluconazole) .... One by mouth once daily for 5 days  Patient Instructions: 1)  Please schedule a follow-up appointment in 1 month. 2)  We will let you know if anything wrong with your chest x-ray.   Prevention & Chronic Care Immunizations   Influenza vaccine: Not documented   Influenza vaccine deferral: Refused  (10/25/2008)    Tetanus booster: Not documented   Td booster deferral: Deferred  (10/25/2008)    Pneumococcal vaccine: Not documented   Pneumococcal vaccine deferral:  Refused  (01/22/2009)    H. zoster vaccine: Not documented   H. zoster vaccine deferral: Deferred  (10/25/2008)  Colorectal Screening   Hemoccult: Not documented   Hemoccult action/deferral: Refused  (10/25/2008)    Colonoscopy: Not documented   Colonoscopy action/deferral: Refused  (11/26/2008)  Other Screening   Pap smear: Not documented   Pap smear action/deferral: Deferred  (10/25/2008)    Mammogram: ASSESSMENT: Negative - BI-RADS 1^MM DIGITAL SCREENING  (10/30/2008)   Mammogram action/deferral: No specific mammographic evidence of malignancy.  Screening mammogram in 1 year.     (10/30/2008)   Mammogram due: 11/2009    DXA bone density scan: Not documented   Smoking status: current  (05/13/2009)   Smoking cessation counseling: yes  (05/13/2009)  Lipids   Total Cholesterol: 181  (11/26/2008)   Lipid panel action/deferral: Lipid Panel ordered   LDL: 112  (11/26/2008)   LDL Direct: Not documented   HDL: 45  (11/26/2008)   Triglycerides: 119  (11/26/2008)  Hypertension   Last Blood Pressure: 118 / 68  (05/13/2009)   Serum creatinine: 0.78  (01/22/2009)   Serum potassium 4.0  (01/22/2009)    Hypertension flowsheet reviewed?: Yes   Progress toward BP goal: At goal  Self-Management Support :   Personal Goals (by the next clinic visit) :      Personal blood pressure goal: 140/90  (10/25/2008)   Patient will work on the following items until the next clinic visit to reach self-care goals:     Medications and monitoring: take my medicines every day  (05/13/2009)     Eating: drink diet soda or water instead of juice or soda, eat more vegetables, use fresh or frozen vegetables, eat foods that are low in salt, eat baked foods instead of fried foods, eat fruit for snacks and desserts, limit or avoid alcohol  (05/13/2009)     Activity: take a 30 minute walk every day  (05/02/2009)    Hypertension self-management support: Pre-printed educational material, Resources for  patients handout  (05/02/2009)    Self-management comments: EXERCISE WHEN ABLE

## 2010-03-11 NOTE — Progress Notes (Signed)
Summary: Prior Authorization- Nasonex  Phone Note Outgoing Call   Call placed by: Angelina Ok RN,  May 10, 2009 10:26 AM Call placed to: Insurer Summary of Call: Prior Authotization for Nasonex 50 micrograms approved.  Dates 05/10/2009 thru 05/10/2010.  Initial call taken by: Angelina Ok RN,  May 10, 2009 10:27 AM    New/Updated Medications: NASONEX 50 MCG/ACT SUSP (MOMETASONE FUROATE) 2 sprays inside each nostril daily.

## 2010-03-11 NOTE — Progress Notes (Signed)
Summary: refill/gg  Phone Note Refill Request  on May 10, 2009 2:02 PM  Refills Requested: Medication #1:  PROZAC 40 MG CAPS Take 1 capsule by mouth once a day   Last Refilled: 04/08/2009  Method Requested: Telephone to Pharmacy Initial call taken by: Merrie Roof RN,  May 10, 2009 2:02 PM  Follow-up for Phone Call        Refill approved-nurse to complete    Prescriptions: PROZAC 40 MG CAPS (FLUOXETINE HCL) Take 1 capsule by mouth once a day  #30 x 5   Entered and Authorized by:   Vassie Loll MD   Signed by:   Vassie Loll MD on 05/12/2009   Method used:   [facility]  [address] [id number]       [address]       [place], [place]  [address]       Ph: [phone] or [phone]       Fax: [phone]   RxID:   [id number]
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
## 2010-03-11 NOTE — Progress Notes (Signed)
Summary: Swelling  Phone Note Call from Patient   Caller: Patient Call For: Kelsey Loll MD Summary of Call: Symptoms:Swelling. No increase in shortness of breath .  Voiding the same.  Swelling increasing. Characteristics: Swelling pt measured left ankle  is 9 1/2 inches , right is 12 1/2 inces left arch is 8 3/4 inces and right is 10 inches. Location:Both legs, ankles Onset: Last couple of days Aggravating Factors: Relieving Factors: Staying off of feet History: CHF.  Pt is on HCTZ Level of Urgency:ASAP Nursing Diagnosis/Chief Complaint: Swelling in both legs and ankles Suggested Follow Up To Physician: Pt wants to know if a medication can be called in does not want to come in. Says she feels better than she did at her last appointment. Patient Education: Keep feet elevated Contingency Plan: Follow Up Response:  Initial call taken by: Angelina Ok RN,  Jun 28, 2009 9:48 AM  Follow-up for Phone Call        The swelling sounds asymmetric though it is in both legs. Has she always had asymmetric swelling of her legs. COncern about the asymmetry would be for DVT. My guess is that this is more likley CHF exacerbation and lasix might be of benefit. I will flag this to Dr. Comer Locket who just saw the pt as well. Follow-up by: Acey Lav MD,  Jun 28, 2009 9:58 AM    Additional Follow-up for Phone Call Additional follow up Details #2::    I saw the patient two days ago and didn't have any edema in both her legs. If the swelling is really bothering her or happened in the last 2 days, she needs to be seen in the clinic and unfortunately no medicines can be called in before somebody sees her. If she is having any SOB, which she was complainng of two days ago, she should go to the emergency ASAP. Follow-up by: Blondell Reveal MD,  Jun 28, 2009 10:10 AM   Appended Document: Swelling I agree with Dr. Comer Locket. Can we send her to the ED for evaluation for DVT./PE  vs CHF exacerbation?

## 2010-03-11 NOTE — Progress Notes (Signed)
Summary: Pain  Phone Note Refill Request Message from:  Patient on January 14, 2010 9:33 AM  Refills Requested: Medication #1:  VICODIN 5-500 MG TABS Take 1 tablet every 8 hours as needed for severe pain.[place] Spaniel that it is early.  Weather is causing her to hurt more.  Has an appointment on 12/14.   Method Requested: Electronic Initial call taken by: Angelina Ok RN,  January 14, 2010 9:33 AM  Follow-up for Phone Call        Refill approved-nurse to complete  Additional Follow-up for Phone Call Additional follow up Details #1::        Gladys checking on her records and the reason for medication, will refill earlier; she is coming next week to see me anyway.     Prescriptions: VICODIN 5-500 MG TABS (HYDROCODONE-ACETAMINOPHEN) Take 1 tablet every 8 hours as needed for severe pain.  #45 x 0   Entered and Authorized by:   Vassie Loll MD   Signed by:   Vassie Loll MD on 01/14/2010   Method used:   Telephoned to ...       [facility] [id number] (retail)       [address] [place], [place]  [address]       Ph: [phone]       Fax: [phone]   RxID:   [id number]

## 2010-03-11 NOTE — Progress Notes (Signed)
Summary: refills/gg  Phone Note Refill Request  on December 02, 2009 12:03 PM  Refills Requested: Medication #1:  HYDROMET 5-1.5 MG/5ML SYRP Use 5 ml evey 6 hours as needed for cough.   Last Refilled: 11/12/2009 Pt has used up refills. Pt # [id number]   Method Requested: Telephone to Pharmacy Initial call taken by: Merrie Roof RN,  December 02, 2009 12:07 PM  Follow-up for Phone Call        Refill approved-nurse to complete    Prescriptions: HYDROMET 5-1.5 MG/5ML SYRP (HYDROCODONE-HOMATROPINE) Use 5 ml evey 6 hours as needed for cough.  #139ml x 2   Entered and Authorized by:   Vassie Loll MD   Signed by:   Vassie Loll MD on 12/02/2009   Method used:   Telephoned to ...       [facility] [id number] (retail)       [address] [place], [place]  [address]       Ph: [phone]       Fax: [phone]   RxID:   [id number]   Appended Document: refills/gg Rx called in

## 2010-03-13 NOTE — Progress Notes (Signed)
Summary: refill/ hla  Phone Note Refill Request Message from:  Patient on February 25, 2010 1:51 PM  Refills Requested: Medication #1:  VICODIN 5-500 MG TABS Take 1 tablet every 8 hours as needed for severe pain..   Dosage confirmed as above?Dosage Confirmed   Last Refilled: 12/28 last visit 12/14  Initial call taken by: Marin Roberts RN,  February 25, 2010 1:51 PM  Follow-up for Phone Call        ok to pick up on or after 03/07/10    Prescriptions: VICODIN 5-500 MG TABS (HYDROCODONE-ACETAMINOPHEN) Take 1 tablet every 8 hours as needed for severe pain.  #45 x 3   Entered by:   Donia Guiles MD   Authorized by:   Leodis Sias MD   Signed by:   Donia Guiles MD on 02/26/2010   Method used:   Printed then faxed to ...       [facility] [id number] (retail)       [address] [place], [place]  [address]       Ph: [phone]       Fax: [phone]   RxID:   [id number]   Appended Document: refill/ hla called to pharm

## 2010-03-13 NOTE — Assessment & Plan Note (Signed)
Summary: ACUTE-ER/FU (PRIBULA)/CFB   Vital Signs:  Patient profile:   65 year old female Height:      65 inches (165.10 cm) Weight:      227.7 pounds (103.50 kg) BMI:     38.03 O2 Sat:      95 % on 2 L/min Temp:     97.7 degrees F oral Pulse rate:   90 / minute BP sitting:   131 / 77  (right arm) Cuff size:   regular  Vitals Entered By: Chinita Pester RN (February 28, 2010 3:49 PM)  O2 Flow:  2 L/min CC: Need pain med. for arthritis pain. Is Patient Diabetic? No Pain Assessment Patient in pain? yes     Location: joints/hips Intensity: 10 Type: aching Onset of pain  Chronic Nutritional Status BMI of > 30 = obese  Have you ever been in a relationship where you felt threatened, hurt or afraid?No   Does patient need assistance? Functional Status Self care Ambulation Normal   Primary Care Provider:  Vassie Loll MD  CC:  Need pain med. 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  OA/DJD: pt have severe disease in multiple joints as seen on x-ray.  Her pain has been managed and is well controlled with vicodin and topical voltaren gel.   She has been evaluated by ortho who recommended bilateral hip and knee replacements, however pt is not an ideal surgical candidate given her severe COPD and NICM; she is not interested in undertaking risks of surgery, rhab period, and does not want any intervention.  Her goal is to focus on her quality of life and pain control.    COPD: pt states  her symptoms are very well controlled with her current regimen of symbicort, spiriva and p.r.n albuterol.  She is on continuous 02.  She denies increased cough, increased sputum production, worsening SOB, DOE, chest pain, or syncope.    Patient denies palpitations, SOB, CP, abdominal pain, nausea, vomiting, depressed mood,fevers, chills, or other complaints.      Depression  History:      The patient is having a depressed mood most of the day and has a diminished interest in her usual daily activities.  The patient denies symptoms of a manic disorder including excessive foolish business investments.        The patient denies that she feels like life is not worth living, denies that she wishes that she were dead, and denies that she has thought about ending her life.        Comments:  d/t "Severe pain".   Preventive Screening-Counseling & Management  Alcohol-Tobacco     Alcohol drinks/day: 0     Smoking Status: quit     Year Started: 1979     Passive Smoke Exposure: yes  Caffeine-Diet-Exercise     Does Patient Exercise: no  Current Medications (verified): 1)  Aspirin 81 Mg Tbec (Aspirin) .... Take 1 Tablet By Mouth Once A Day 2)  Prozac 40 Mg Caps (Fluoxetine Hcl) .... Take 1 Capsule By Mouth Once A Day 3)  Furosemide 40 Mg Tabs (Furosemide) .... Take 1 Tablet By Mouth Once A Day 4)  Tiazac 420 Mg Xr24h-Cap (Diltiazem Hcl Er Beads) .... Take 1capsule By Mouth Once A Day 5)  Claritin 10 Mg Tabs (Loratadine) .... Take 1 Tablet By Mouth Once A Day 6)  Symbicort 160-4.5 Mcg/act Aero (Budesonide-Formoterol Fumarate) .Marland Kitchen.. 1 Puff Two Times A Day 7)  Ventolin Hfa 108 (90 Base) Mcg/act Aers (Albuterol Sulfate) .Marland Kitchen.. 1-2 Puffs Every 4 Hours As Needed For Shorthness of Breath 8)  Spiriva Handihaler 18 Mcg Caps (Tiotropium Bromide Monohydrate) .Marland Kitchen.. 1 Cao Inhaled Daily. 9)  Voltaren 1 % Gel (Diclofenac Sodium) .... Apply To The Affected Area 2-3 Times Per Day As Needed For Pain 10)  Hydromet 5-1.5 Mg/37ml Syrp (Hydrocodone-Homatropine) .... Use 5 Ml Evey 6 Hours As Needed For Cough. 11)  K-Lor 20 Meq Pack (Potassium Chloride) .... Take 1 Tablet By Mouth Daily 12)  Vicodin 5-500 Mg Tabs (Hydrocodone-Acetaminophen) .... Take 1 Tablet Every 8 Hours As Needed For Severe Pain.  Allergies (verified): 1)  ! Benadryl 2)  ! Sudafed 3)  ! Ra Nite Time Cough (Doxylamine-Dm) 4)   ! Tramadol Hcl  Past History:  Past medical, surgical, family and social histories (including risk factors) reviewed for relevance to current acute and chronic problems.  Past Medical History: Reviewed history from 11/26/2008 and no changes required. Atrial fibrillation (cardioversion x2) Depression Hypertension Nonischemic cardiomyopathy Obsessive compulsive disorder Insomnia COPD  Past Surgical History: Reviewed history from 12/29/2005 and no changes required. Tubal ligation  Family History: Reviewed history from 11/26/2008 and no changes required. Family History Asthma (daughter had when little - not anymore) Family History Emphysema (mother in old age) Cancer (brain, breast, liver, skin) Allergies  Social History: Reviewed history from 08/28/2009 and no changes required. Patient is a current smoker. Pt states smokes 1 cigarette a day. Pt is separated, lives with daughter and grand kids.Smoking Status:  quit  Physical Exam  General:  obese, alert, cooperative, NAD, speaking in full sentences. Head:  normocephalic and atraumatic.  Eyes:  pupils equal, pupils round, and pupils reactive to light.  anicteric. vision grossly intact.  EOMI Mouth:  Oral mucosa and oropharynx without lesions or exudates. pharynx pink and moist.   Neck:  no JVD supple, full ROM, and no masses.   Lungs:  normal respiratory effort, no accessory muscle use, no crackles, and no wheezes.  Decreased breath sound noted throughout bilateral lung fields.  Fair air movement Heart:  normal rate, irregular rhythm Abdomen:  soft, non-tender, and normal bowel sounds.   Extremities:  no edema, no cyanosis. Neurologic:  alert & oriented X3, cranial nerves II-XII intact;no focal deficit.   Skin:  turgor normal, color normal, no rashes, and no suspicious lesions.   Psych:  Oriented X3, memory intact for recent and remote, normally interactive, good eye contact, not anxious appearing, and not depressed appearing.      Impression & Recommendations:  Problem # 1:  DEGENERATIVE JOINT DISEASE (ICD-715.90) Pt has severe OA/DJD in multiple joints that is well controlled on Vicodin and voltaren gel.   She has never had any problems with narcotic abuse at this clinic and has received refills on a as needed basis, within reason, as determined by her PCP.  Her last refill request was denied as she requested the fill a few days before the 30 day mark.  Pt is currently experiencing pain.  As she has previously received refills as needed at the discretion of her PCP, has never had any problems with narctic abuse/misuse with this clinic, is current in acute pain and needs a refill only a few days before the 30day mark; I believe a refill is indicated at this time.  Will contact her pharmacy to clairfy this and ask that the refill  be filled today for her to pick up on her way home from clinic.  Her updated medication list for this problem includes:    Aspirin 81 Mg Tbec (Aspirin) .Marland Kitchen... Take 1 tablet by mouth once a day    Vicodin 5-500 Mg Tabs (Hydrocodone-acetaminophen) .Marland Kitchen... Take 1 tablet every 8 hours as needed for severe pain.  Problem # 2:  COPD (ICD-496) Pt is doing very well on her current regimen.  Will not make any changes today.  Her updated medication list for this problem includes:    Symbicort 160-4.5 Mcg/act Aero (Budesonide-formoterol fumarate) .Marland Kitchen... 1 puff two times a day    Ventolin Hfa 108 (90 Base) Mcg/act Aers (Albuterol sulfate) .Marland Kitchen... 1-2 puffs every 4 hours as needed for shorthness of breath    Spiriva Handihaler 18 Mcg Caps (Tiotropium bromide monohydrate) .Marland Kitchen... 1 cao inhaled daily.  Problem # 3:  HYPERTENSION (ICD-401.9) BP at goal, pt tolerating her meds well.  WIll check CMET today. Her updated medication list for this problem includes:    Furosemide 40 Mg Tabs (Furosemide) .Marland Kitchen... Take 1 tablet by mouth once a day    Tiazac 420 Mg Xr24h-cap (Diltiazem hcl er beads) .Marland Kitchen... Take 1capsule by  mouth once a day  Orders: T-CMP with Estimated GFR ([address])  BP today: 131/77 Prior BP: 102/47 (01/22/2010)  Labs Reviewed: K+: 4.4 (06/27/2009) Creat: : 0.80 (06/27/2009)   Chol: 182 (05/24/2009)   HDL: 43 (05/24/2009)   LDL: 126 (05/24/2009)   TG: 66 (05/24/2009)  Complete Medication List: 1)  Aspirin 81 Mg Tbec (Aspirin) .... Take 1 tablet by mouth once a day 2)  Prozac 40 Mg Caps (Fluoxetine hcl) .... Take 1 capsule by mouth once a day 3)  Furosemide 40 Mg Tabs (Furosemide) .... Take 1 tablet by mouth once a day 4)  Tiazac 420 Mg Xr24h-cap (Diltiazem hcl er beads) .... Take 1capsule by mouth once a day 5)  Claritin 10 Mg Tabs (Loratadine) .... Take 1 tablet by mouth once a day 6)  Symbicort 160-4.5 Mcg/act Aero (Budesonide-formoterol fumarate) .Marland Kitchen.. 1 puff two times a day 7)  Ventolin Hfa 108 (90 Base) Mcg/act Aers (Albuterol sulfate) .Marland Kitchen.. 1-2 puffs every 4 hours as needed for shorthness of breath 8)  Spiriva Handihaler 18 Mcg Caps (Tiotropium bromide monohydrate) .Marland Kitchen.. 1 cao inhaled daily. 9)  Voltaren 1 % Gel (Diclofenac sodium) .... Apply to the affected area 2-3 times per day as needed for pain 10)  Hydromet 5-1.5 Mg/32ml Syrp (Hydrocodone-homatropine) .... Use 5 ml evey 6 hours as needed for cough. 11)  K-lor 20 Meq Pack (Potassium chloride) .... Take 1 tablet by mouth daily 12)  Vicodin 5-500 Mg Tabs (Hydrocodone-acetaminophen) .... Take 1 tablet every 8 hours as needed for severe pain.  Patient Instructions: 1)  Please schedule a follow-up appointment in 1-3 months with Dr. Arvilla Market. 2)  Keep taking all of your medications as directed. 3)  Call the clinic with any concerns or questions. 4)  I will call you if any of your lab work is abnormal. Prescriptions: VICODIN 5-500 MG TABS (HYDROCODONE-ACETAMINOPHEN) Take 1 tablet every 8 hours as needed for severe pain.  #45 x 3   Entered and Authorized by:   Nelda Bucks DO   Signed by:   Nelda Bucks DO on 03/04/2010   Method  used:   Telephoned to ...       [facility] [id number] (retail)       [address]  [place], [place]  [address]       Ph: [phone]       Fax: [phone]   RxID:   [id number] HYDROMET 5-1.5 MG/5ML SYRP (HYDROCODONE-HOMATROPINE) Use 5 ml evey 6 hours as needed for cough.  #150 ml x 3   Entered and Authorized by:   Nelda Bucks DO   Signed by:   Nelda Bucks DO on 03/04/2010   Method used:   Telephoned to ...       [facility] [id number] (retail)       [address] [place], [place]  [address]       Ph: [phone]       Fax: [phone]   RxID:   [id number]    Orders Added: 1)  T-CMP with Estimated GFR [[id number]] 2)  Est. Patient Level IV [88416]    Prevention & Chronic Care Immunizations   Influenza vaccine: Not documented   Influenza vaccine deferral: Refused  (10/25/2008)    Tetanus booster: Not documented   Td booster deferral: Deferred  (10/25/2008)    Pneumococcal vaccine: Not documented   Pneumococcal vaccine deferral: Refused  (01/22/2009)    H. zoster vaccine: Not documented   H. zoster vaccine deferral: Deferred  (10/25/2008)  Colorectal Screening   Hemoccult: Not documented   Hemoccult action/deferral: Refused  (10/25/2008)    Colonoscopy: Not documented   Colonoscopy action/deferral: Refused  (11/26/2008)  Other Screening   Pap smear: Not documented   Pap smear action/deferral: Deferred  (10/25/2008)    Mammogram: ASSESSMENT: Negative - BI-RADS 1^MM DIGITAL SCREENING  (10/30/2008)   Mammogram action/deferral: No specific mammographic evidence of malignancy.  Screening mammogram in 1 year.     (10/30/2008)   Mammogram due: 11/2009    DXA bone density scan: Not documented   Smoking status: quit  (02/28/2010)  Lipids   Total Cholesterol: 182  (05/24/2009)   Lipid panel action/deferral: Lipid Panel ordered   LDL: 126  (05/24/2009)   LDL Direct: Not documented    HDL: 43  (05/24/2009)   Triglycerides: 66  (05/24/2009)    SGOT (AST): 10  (07/20/2008)   SGPT (ALT): <8 U/L  (07/20/2008)   Alkaline phosphatase: 71  (07/20/2008)   Total bilirubin: 0.3  (07/20/2008)  Hypertension   Last Blood Pressure: 131 / 77  (02/28/2010)   Serum creatinine: 0.80  (06/27/2009)   Serum potassium 4.4  (06/27/2009)  Self-Management Support :   Personal Goals (by the next clinic visit) :      Personal blood pressure goal: 140/90  (10/25/2008)     Personal LDL goal: 100  (06/26/2009)    Patient will work on the following items until the next clinic visit to reach self-care goals:     Medications and monitoring: take my medicines every day, bring all of my medications to every visit  (02/28/2010)     Eating: use fresh or frozen vegetables, eat foods that are low in salt, eat baked foods instead of fried foods  (02/28/2010)     Activity: take a 30 minute walk every day  (10/21/2009)    Hypertension self-management support: Written self-care plan  (02/28/2010)   Hypertension self-care plan printed.    Lipid self-management support: Written self-care plan  (02/28/2010)   Lipid self-care plan printed.  Process Orders Check Orders Results:     [facility]: ABN not  required for this insurance Tests Sent for requisitioning (March 04, 2010 5:56 PM):     02/28/2010: [facility] -- T-CMP with Estimated GFR [[id number]] (signed)

## 2010-03-13 NOTE — Progress Notes (Signed)
Summary: MEDICATION CHANGES//KG  Phone Note Outgoing Call   Call placed by: Cynda Familia Duncan Dull),  January 29, 2010 10:23 AM Call placed to: Patient Summary of Call: Calll made to pt to make her aware that her insurance will not pay for the fexofenadine 60mg  tabs twice daily.  The preferred med is cetirizine (zyrtec) or loratadine (claritin).  Pt states that she only got the rx  for her spring-time allergies and she already has rx for loratadine. Pharmacy was contacted to disregard fexofenadine rx and pt will f/u in the spring with her new PCP.   Pt also states that she is no longer on the Advair (per pt, it was changed to symbicort by Dr Lysbeth Penner) and she had requested that we send in rx for symbicort 160mg  during her last visit as well as a rx for nexium 40mg  once daily.  She was started on these meds by Dr Lysbeth Penner at [facility] and thought that we would be calling them in.  Will forward info to Dr Gwenlyn Perking to make changes to pt's medication list (add symbicort and nexium, and d/c the Advair and fexofenadine) if request is approved.  Pharmacy was contacted,  pt last refilled symbicort on 01/06/10 and has 4 refills and the nexium was also refilled on 01/06/10 and it has 8 refills  left.  Pt had wanted to change everything over to [facility] as she will no longer be going to [facility] Duncan Dull)  January 29, 2010 11:52 AM   Initial call taken by: Cynda Familia Duncan Dull),  January 29, 2010 10:32 AM  Follow-up for Phone Call        Thanks Joyce Gross, I will change and update her meds.

## 2010-03-13 NOTE — Progress Notes (Signed)
Summary: Refill/gh  Phone Note Refill Request Message from:  Fax from Pharmacy on February 05, 2010 11:10 AM  Refills Requested: Medication #1:  VICODIN 5-500 MG TABS Take 1 tablet every 8 hours as needed for severe pain..   Last Refilled: 01/14/2010 Last office visit was 01/22/2010.  Last labs were 06/27/2009.   Method Requested: Electronic Initial call taken by: Angelina Ok RN,  February 05, 2010 11:11 AM  Follow-up for Phone Call        Will give one refill. Needs medication contract and UDS at her next visit Follow-up by: Julaine Fusi  DO,  February 05, 2010 11:21 AM  Additional Follow-up for Phone Call Additional follow up Details #1::        Rx called to pharmacy Additional Follow-up by: Merrie Roof RN,  February 05, 2010 12:14 PM    Prescriptions: VICODIN 5-500 MG TABS (HYDROCODONE-ACETAMINOPHEN) Take 1 tablet every 8 hours as needed for severe pain.  #45 x 0   Entered and Authorized by:   Julaine Fusi  DO   Signed by:   Julaine Fusi  DO on 02/05/2010   Method used:   Telephoned to ...       [facility] [id number] (retail)       [address] [place], [place]  [address]       Ph: [phone]       Fax: [phone]   RxID:   [id number]

## 2010-03-13 NOTE — Progress Notes (Signed)
Summary: swelling/ hla  Phone Note Call from Patient   Summary of Call: pt calls, leaving message that her ankles are "a little swollen" and wants to know what to do, rtc got vmail, left message to call clinic back and speak w/ triage Initial call taken by: Marin Roberts RN,  February 10, 2010 2:50 PM
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
## 2010-04-24 ENCOUNTER — Encounter: Payer: Self-pay | Admitting: Internal Medicine

## 2010-04-24 ENCOUNTER — Ambulatory Visit (INDEPENDENT_AMBULATORY_CARE_PROVIDER_SITE_OTHER): Payer: Medicaid Other | Admitting: Internal Medicine

## 2010-04-24 ENCOUNTER — Ambulatory Visit: Payer: Self-pay | Admitting: Internal Medicine

## 2010-04-24 VITALS — BP 122/69 | HR 73 | Temp 98.6°F | Ht 65.5 in | Wt 229.5 lb

## 2010-04-24 DIAGNOSIS — IMO0002 Reserved for concepts with insufficient information to code with codable children: Secondary | ICD-10-CM

## 2010-04-24 DIAGNOSIS — F324 Major depressive disorder, single episode, in partial remission: Secondary | ICD-10-CM

## 2010-04-24 MED ORDER — HYDROCODONE-HOMATROPINE 5-1.5 MG/5ML PO SYRP: 5.0000 mL | ORAL_SOLUTION | Freq: Four times a day (QID) | ORAL | Status: DC | PRN

## 2010-04-24 MED ORDER — FLUOXETINE HCL 20 MG PO CAPS: ORAL_CAPSULE | ORAL | Status: DC

## 2010-04-28 LAB — DIFFERENTIAL
Lymphocytes Relative: 23 % (ref 12–46)
Lymphs Abs: 2.1 10*3/uL (ref 0.7–4.0)
Monocytes Absolute: 0.7 10*3/uL (ref 0.1–1.0)
Monocytes Relative: 8 % (ref 3–12)
Neutro Abs: 6 10*3/uL (ref 1.7–7.7)
Neutrophils Relative %: 66 % (ref 43–77)

## 2010-04-28 LAB — POCT CARDIAC MARKERS
CKMB, poc: <1 ng/mL — ABNORMAL LOW (ref 1.0–8.0)
Myoglobin, poc: 63.3 ng/mL (ref 12–200)
Troponin i, poc: <0.05 ng/mL (ref 0.00–0.09)

## 2010-04-28 LAB — CBC
Hemoglobin: 14 g/dL (ref 12.0–15.0)
RBC: 4.11 MIL/uL (ref 3.87–5.11)
WBC: 9.1 10*3/uL (ref 4.0–10.5)

## 2010-04-28 LAB — BASIC METABOLIC PANEL
Calcium: 9 mg/dL (ref 8.4–10.5)
Creatinine, Ser: 0.76 mg/dL (ref 0.4–1.2)
GFR calc Af Amer: >60 mL/min (ref >60)
Sodium: 139 mEq/L (ref 135–145)

## 2010-04-28 LAB — BRAIN NATRIURETIC PEPTIDE: Pro B Natriuretic peptide (BNP): 130 pg/mL — ABNORMAL HIGH (ref 0.0–100.0)

## 2010-04-29 LAB — DIFFERENTIAL
Basophils Absolute: 0 10*3/uL (ref 0.0–0.1)
Lymphocytes Relative: 13 % (ref 12–46)
Lymphs Abs: 1.1 10*3/uL (ref 0.7–4.0)
Monocytes Absolute: 0.3 10*3/uL (ref 0.1–1.0)
Neutro Abs: 7 10*3/uL (ref 1.7–7.7)

## 2010-04-29 LAB — BASIC METABOLIC PANEL
Calcium: 8.8 mg/dL (ref 8.4–10.5)
GFR calc Af Amer: >60 mL/min (ref >60)
GFR calc non Af Amer: >60 mL/min (ref >60)
Glucose, Bld: 120 mg/dL — ABNORMAL HIGH (ref 70–99)
Sodium: 138 mEq/L (ref 135–145)

## 2010-04-29 LAB — CBC
Hemoglobin: 15.7 g/dL — ABNORMAL HIGH (ref 12.0–15.0)
RBC: 4.66 MIL/uL (ref 3.87–5.11)
RDW: 14.6 % (ref 11.5–15.5)
WBC: 8.5 10*3/uL (ref 4.0–10.5)

## 2010-04-29 LAB — D-DIMER, QUANTITATIVE: D-Dimer, Quant: 1.8 ug/mL-FEU — ABNORMAL HIGH (ref 0.00–0.48)
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
## 2010-05-14 LAB — DIFFERENTIAL
Basophils Relative: 0 % (ref 0–1)
Eosinophils Absolute: 0 10*3/uL (ref 0.0–0.7)
Monocytes Absolute: 0.2 10*3/uL (ref 0.1–1.0)
Monocytes Relative: 2 % — ABNORMAL LOW (ref 3–12)

## 2010-05-14 LAB — CULTURE, BLOOD (ROUTINE X 2): Culture: NO GROWTH

## 2010-05-14 LAB — CBC
HCT: 43.4 % (ref 36.0–46.0)
HCT: 44.1 % (ref 36.0–46.0)
Hemoglobin: 15 g/dL (ref 12.0–15.0)
MCHC: 34.2 g/dL (ref 30.0–36.0)
MCHC: 34.3 g/dL (ref 30.0–36.0)
MCV: 98.2 fL (ref 78.0–100.0)
MCV: 98.6 fL (ref 78.0–100.0)
MCV: 98.8 fL (ref 78.0–100.0)
Platelets: 202 10*3/uL (ref 150–400)
Platelets: 213 10*3/uL (ref 150–400)
Platelets: 220 10*3/uL (ref 150–400)
RBC: 4.46 MIL/uL (ref 3.87–5.11)
RDW: 13.8 % (ref 11.5–15.5)
RDW: 13.9 % (ref 11.5–15.5)
RDW: 14.1 % (ref 11.5–15.5)
WBC: 13.5 10*3/uL — ABNORMAL HIGH (ref 4.0–10.5)

## 2010-05-14 LAB — RAPID URINE DRUG SCREEN, HOSP PERFORMED
Amphetamines: NOT DETECTED
Opiates: NOT DETECTED
Tetrahydrocannabinol: NOT DETECTED

## 2010-05-14 LAB — COMPREHENSIVE METABOLIC PANEL
ALT: 19 U/L (ref 0–35)
AST: 17 U/L (ref 0–37)
Albumin: 3.6 g/dL (ref 3.5–5.2)
Alkaline Phosphatase: 95 U/L (ref 39–117)
Potassium: 3.2 mEq/L — ABNORMAL LOW (ref 3.5–5.1)
Sodium: 138 mEq/L (ref 135–145)
Total Protein: 7.4 g/dL (ref 6.0–8.3)

## 2010-05-14 LAB — BASIC METABOLIC PANEL
BUN: 21 mg/dL (ref 6–23)
BUN: 22 mg/dL (ref 6–23)
BUN: 23 mg/dL (ref 6–23)
CO2: 32 mEq/L (ref 19–32)
Chloride: 101 mEq/L (ref 96–112)
Creatinine, Ser: 0.8 mg/dL (ref 0.4–1.2)
GFR calc non Af Amer: >60 mL/min (ref >60)
GFR calc non Af Amer: >60 mL/min (ref >60)
Glucose, Bld: 136 mg/dL — ABNORMAL HIGH (ref 70–99)
Glucose, Bld: 147 mg/dL — ABNORMAL HIGH (ref 70–99)
Glucose, Bld: 155 mg/dL — ABNORMAL HIGH (ref 70–99)
Potassium: 3.5 mEq/L (ref 3.5–5.1)
Potassium: 4.3 mEq/L (ref 3.5–5.1)

## 2010-05-14 LAB — CARDIAC PANEL(CRET KIN+CKTOT+MB+TROPI)
CK, MB: 3.3 ng/mL (ref 0.3–4.0)
Total CK: 44 U/L (ref 7–177)
Troponin I: 0.01 ng/mL (ref 0.00–0.06)

## 2010-05-14 LAB — BRAIN NATRIURETIC PEPTIDE: Pro B Natriuretic peptide (BNP): 136 pg/mL — ABNORMAL HIGH (ref 0.0–100.0)

## 2010-05-14 LAB — LEGIONELLA ANTIGEN, URINE: Legionella Antigen, Urine: NEGATIVE

## 2010-05-14 LAB — EXPECTORATED SPUTUM ASSESSMENT W GRAM STAIN, RFLX TO RESP C

## 2010-05-15 ENCOUNTER — Ambulatory Visit (INDEPENDENT_AMBULATORY_CARE_PROVIDER_SITE_OTHER): Payer: Medicaid Other | Admitting: Internal Medicine

## 2010-05-15 DIAGNOSIS — J440 Chronic obstructive pulmonary disease with acute lower respiratory infection: Secondary | ICD-10-CM

## 2010-05-15 DIAGNOSIS — J449 Chronic obstructive pulmonary disease, unspecified: Secondary | ICD-10-CM

## 2010-05-15 DIAGNOSIS — J45909 Unspecified asthma, uncomplicated: Secondary | ICD-10-CM

## 2010-05-15 MED ORDER — CETIRIZINE HCL 10 MG PO CHEW: 10.0000 mg | CHEWABLE_TABLET | Freq: Every day | ORAL | Status: DC

## 2010-05-15 MED ORDER — AZITHROMYCIN 250 MG PO TABS: ORAL_TABLET | ORAL | Status: AC

## 2010-05-15 NOTE — Patient Instructions (Signed)
YOU HAVE AN INFECTION IN YOUR LOWER AIRWAYS, SO WE ARE TREATING YOU WITH AZITHROMYCIN  CHANGE LORATIDINE TO CETERIZINE FOR ALLERGIES

## 2010-05-15 NOTE — Progress Notes (Signed)
  Subjective:    Patient ID: Kelsey Garcia, female    DOB: 1945-12-25, 65 y.o.   MRN: [id number]  HPI 65 year old woman known to me from a previous visit, here with C/O cough, sinus pressure, and very mild wheezing  She has had a flare in her usual morning cough, with change in sputum, no fevers, marked DOE, or pleuritic symptoms. Also notes quite a bit of sinus pressure with a persistant itching of her hard/soft palates  Review of Systems     Objective:   Physical Exam  Heent-mild erythema of OP Neck, no LAD Lungs-focal wheezes and rhonchi in right post base Car-normal Skin-warm, dry, no rashes Psy-Affect much brighter, more animated.       Assessment & Plan:   1. Exacerbation of chronic bronchitis, perhaps triggered or worsened bu allergic rhinitis/sinusitis  Azithromycin, 5 day Change Loratidine to Ceterizine FU prn or as scheduled  2. Depression: Doing much better on the increased dose of fluoxetine (60 mg) "I feel like my usual fiesty self, doctor"  3. Tobacco-advised that we'd want to discuss nicotine replacement and possible buproprion therapy at her next visit to better educate her on her options

## 2010-06-02 ENCOUNTER — Other Ambulatory Visit: Payer: Self-pay | Admitting: *Deleted

## 2010-06-02 MED ORDER — HYDROCODONE-ACETAMINOPHEN 5-500 MG PO CAPS: 1.0000 | ORAL_CAPSULE | Freq: Four times a day (QID) | ORAL | Status: DC | PRN

## 2010-06-02 MED ORDER — BUDESONIDE-FORMOTEROL FUMARATE 160-4.5 MCG/ACT IN AERO: 2.0000 | INHALATION_SPRAY | Freq: Two times a day (BID) | RESPIRATORY_TRACT | Status: DC

## 2010-06-02 MED ORDER — HYDROCODONE-HOMATROPINE 5-1.5 MG/5ML PO SYRP: 5.0000 mL | ORAL_SOLUTION | Freq: Four times a day (QID) | ORAL | Status: DC | PRN

## 2010-06-04 NOTE — Telephone Encounter (Signed)
Prescriptions were called to pt's pharmacy.

## 2010-06-27 NOTE — Cardiovascular Report (Signed)
NAME:  Kelsey Garcia, Kelsey Garcia                            ACCOUNT NO.:  [id number]   MEDICAL RECORD NO.:  [id number]                   PATIENT TYPE:  OIB   LOCATION:  2856                                 FACILITY:  [facility]   PHYSICIAN:  Colleen Can. Deborah Chalk, M.D.            DATE OF BIRTH:  February 14, 1945   DATE OF PROCEDURE:  DATE OF DISCHARGE:                              CARDIAC CATHETERIZATION   PROCEDURE:  Cardioversion.   ANESTHESIOLOGIST:  Germaine Pomfret, M.D.   ANESTHESIA:  IV Pentothal 200 mg.   PROCEDURE IN DETAIL:  Using anteroposterior paddles, the patient received  200 watt-seconds of biphasic energy in one shock and converted to normal  sinus rhythm.  The patient tolerated the procedure well.                                               Colleen Can. Deborah Chalk, M.D.    SNT/MEDQ  D:  08/06/2003  T:  08/06/2003  Job:  [id number]   cc:   Germaine Pomfret, M.D.  [phone]  [address] [place], [place] [address]  Fax: [phone]

## 2010-06-27 NOTE — Consult Note (Signed)
NAME:  Kelsey Garcia, Kelsey Garcia                            ACCOUNT NO.:  [id number]   MEDICAL RECORD NO.:  [id number]                   PATIENT TYPE:  INP   LOCATION:  4710                                 FACILITY:  [facility]   PHYSICIAN:  Colleen Can. Deborah Chalk, M.D.            DATE OF BIRTH:  [birth year]   DATE OF CONSULTATION:  06/26/2003  DATE OF DISCHARGE:                                   CONSULTATION   CONSULTING PHYSICIAN:  Colleen Can. Deborah Chalk, M.D.   Thank you very much for asking [place] to see [name].  She is a pleasant 65-  year-old female with a past history of heart failure and a left ventricular  dysfunction with atrial fibrillation and prior cardioversion.  She has moved  back to the [place] area since November 2004, and apparently ran out of  her medicines.  There is some concern of how long she has been taking her  medicines over the past year because of cost.  She presents with increasing  shortness of breath, PND, orthopnea, has noted to have recurrent atrial  fibrillation with a rapid ventricular response.  She has had rate control  and has been on Coumadin anticoagulation in the hospital and was referred  for evaluation for possible cardioversion.   PAST MEDICAL HISTORY:  1. Atrial fibrillation in the past.  2. Multiple substance abuse including alcohol, drugs, and tobacco.  3. She has had a history of heart failure.  4. There is a question of sepsis.  5. 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  6. She has had a chronic anxiety/depression.  7. OCD.  8. Migraines.  9. Insomnia.  10.      History of tubal ligation.  11.      History of kidney stones.   ALLERGIES:  1. BENADRYL.  2. NYQUIL.   MEDICATIONS:  1. Aspirin.  2. Coumadin.  3. Lanoxin 0.25 mg a day.  4. Prozac 30 mg a day.  5. Cardizem 30 mg q.6h.   FAMILY HISTORY:  Mother died at age 19.  Father died secondary to  alcoholism.   SOCIAL HISTORY:  She is divorced, has two daughters.   She is self-pay, no  insurance.  She uses tobacco at least three packs per day and has a past  history of alcohol and drug abuse.   REVIEW OF SYSTEMS:  History of presyncope, no frank syncope.  A history of  cough and shortness of breath with and without exertion.  She has had mood  swings.  She is depressed.  She has a history of palpitations at least over  three weeks but certainly it is hard to tell when she went into atrial  fibrillation.   PHYSICAL EXAMINATION:  GENERAL:  She is an anxious pleasant female in no  acute distress.  She is articulate.  She is tearful at times.  VITAL SIGNS:  Show  a blood pressure of 137/85, heart rate in the 80s.  SKIN:  Warm and dry.  Color is normal.  LUNGS:  Coarse.  HEART:  Showed an irregularly irregular rhythm without murmur.  ABDOMEN:  Negative.  EXTREMITIES:  Show varicosities.   LAB:  CK is negative.  B-peptide is 209.  Troponins are all basically  negative.  TSH is normal.  __________  is 34.  White count is 4,000.  Chemistries are basically normal.  INR is 1.1 on Jun 25, 2003.   EKG shows atrial fibrillation with nonspecific changes.   A 2D echocardiogram showed an ejection fraction in the 40-50% with mild  global hypokinesia.   OVERALL IMPRESSION:  1. Recurrent atrial fibrillation with duration probably in the three week     range.  2. Dyspnea.  3. History of congestive heart failure with left ventricular dysfunction.  4. Significant situational and financial issues.  5. Depression/anxiety.   PLAN:  We will ask that she be on Coumadin anticoagulation for at least  three weeks prior to cardioversion.  We would consider it appropriate to  have rate control with a long-acting Cardizem product approximately 180 mg a  day and digoxin 0.25 mg per day.  That appears to have had satisfactory rate  control in the hospital.  It would be my plan for her to be monitored in  [facility] and have at least three consecutive weeks  of  documented satisfactory prothrombin times and anticoagulation with an INR of  at least greater than 2.0.  At that point in time, if our office could be  contacted [phone] we could arrange outpatient cardioversion.   There clearly needs to be lots of work done from the standpoint of the  social situation and being able to facilitate this complicated plan.  Ms.  Kelsey Garcia certainly seems intelligent enough to be able to manage this, and I  think that a good bit of this just depends upon her degree of compliance.  There certainly are issues as to whether or not she would purchase the  medication and be compliant enough to show up for appropriate laboratory  testing and then the social concerns about travel with that.  But as she  says, she needs to know what the plan is in order to begin to make  arrangements to accomplish this.  I do not think cardioversion should be  performed without appropriate anticoagulation over this time frame.   ADDENDUM:  She did have moderate mitral regurgitation on her echocardiogram.                                               Colleen Can. Deborah Chalk, M.D.    SNT/MEDQ  D:  06/26/2003  T:  06/27/2003  Job:  [id number]   cc:   Fransisco Hertz, M.D.  [address]  [place] [address]  Fax: [phone]

## 2010-06-27 NOTE — Discharge Summary (Signed)
NAME:  Kelsey Garcia, Kelsey Garcia                            ACCOUNT NO.:  [id number]   MEDICAL RECORD NO.:  [id number]                   PATIENT TYPE:  INP   LOCATION:  4710                                 FACILITY:  [facility]   PHYSICIAN:  Fransisco Hertz, M.D.               DATE OF BIRTH:  05-02-1945   DATE OF ADMISSION:  06/24/2003  DATE OF DISCHARGE:  06/26/2003                                 DISCHARGE SUMMARY   DISCHARGE MEDICATIONS:  1. Aspirin 325 mg p.o. daily.  2. Digoxin 0.25 mg p.o. daily.  3. Prozac 30 mg p.o. daily.  4. Coumadin 10 mg daily x 2 days, then to see Dr. Shaune Leeks for INR check.  5. Cardizem CD 180 mg p.o. daily.   DISCHARGE DIAGNOSES:  1. Atrial fibrillation with rapid ventricular rate, currently controlled.  2. Nonischemic congestive heart failure, ejection fraction 40 to 50%.  3. Hypertension.  4. Depression and anxiety.  5. Tobacco abuse.  6. History of intravenous drug use.  7. Moderate mitral valve regurgitation.  8. History of migraines.  9. History of tubal ligation.  10.      History of gallstones.  11.      History of kidney stones.   FOLLOW UP:  The patient will be seen in the [facility] on  Jun 28, 2003, at 2 p.m. by Dr. Shaune Leeks for Coumadin level check. She will  be seen in the [facility] on Jul 05, 2003, at 2 p.m. with  Dr. Birdie Sons.  The patient will have digoxin level and BMET checked at  that time.  The patient is to be seen by Dr. Deborah Chalk of cardiology in three  weeks of therapeutic INR for possible cardioversion.   CONSULTATIONS:  Jun 26, 2003, Colleen Can. Deborah Chalk, M.D., cardiology.   PROCEDURES:  1. Jun 24, 2003, chest x-ray: Borderline cardiomegaly without CHF, chronic     interstitial changes.  2. Jun 25, 2003, 2-D echocardiogram:  Overall summary, overall left     ventricular systolic function mildly decreased, left ventricular ejection     fraction estimated range 40 to 50%. There is mild diffuse  left     ventricular hypokinesis.  There was moderate mitral valve regurgitation.   HISTORY OF PRESENT ILLNESS:  The patient is a 65 year old white female with  history of atrial fibrillation status post cardioversion in February 2004,  previous estimated ejection fraction 25% on TEE in February 2004, who  presented to the emergency department with three to four days of shortness  of breath.  The patient reports she felt as though she had been drowning.  She had note been able to sleep for greater than 15 minutes in the last  three day secondary to orthopnea and PND.  The patient has been off  medication for nine months to one year for financial reasons. She reports  minimal lower  extremity edema, reports frothy cough and fatigue. S he denies  current chest pain, although she has had brief, intermittent, sharp pain,  last occurring approximately two weeks ago.  No associated diaphoresis or  nausea or vomiting.  The patient reports she was previously on digoxin,  Toprol, Altace, and Prozac.  The patient is currently only taking aspirin  325 mg daily.   PHYSICAL EXAMINATION:  VITAL SIGNS:  Afebrile.  Blood pressure 126/63, heart  rate 143, down to 109, respirations 18, O2 saturation 99% on room air.  GENERAL:  No apparent distress, conversant.  HEENT:  Pupils equal, round, and reactive to light.  Extraocular movements  intact.  Oropharynx clear, dentures in place, mucous membranes moist.  NECK:  Supple.  No JVD, no carotid bruits.  LUNGS: Decreased breath sounds throughout.  No wheezes, rhonchi, or  crackles.  CARDIOVASCULAR:  Irregularly irregular.  Tachycardic.  ABDOMEN:  Benign.  EXTREMITIES:  No cyanosis or clubbing.  Trace edema, 2+ dorsalis pedis  pulses.  SKIN:  Warm and dry.  NEUROLOGIC:  No focal deficits.  Moves all extremities x 4.  Psychiatric  labile.   ADMISSION LABORATORY AND X-RAY DATA:  WBC 5.1, hemoglobin 14.7, platelets  205.  BNP 209.  D-dimer 0.26.  CK-MB less  than 1, troponin 0.05, myoglobin  56.3.  Sodium 140, potassium 4.1, chloride 108, bicarb 24, BUN 10,  creatinine 0.8, glucose 94.  Total bilirubin 0.9, alkaline phosphatase 65,  AST 17, ALT 15, total protein 6.7, albumin 3.3, calcium 9.0.  TSH 0.714.  Alcohol level less than 5.  Urine drug screen negative.   Chest x-ray:  Mild cardiomegaly, no active disease.   EKG:  Atrial fibrillation with rate in the 120s.   HOSPITAL COURSE:  #1.  ATRIAL FIBRILLATION WITH RAPID VENTRICULAR RATE:  The patient was  admitted to the hospital and started on a Cardizem drip.  The patient  tolerated it well, maintaining blood pressure greater than systolic 100 and  rate less than 100.  The patient was titrated off Cardizem drip and switched  to p.o. Cardizem 30 mg four times a day.  The patient was started on  anticoagulation, determined there was no need for heparin at this time as  patient had no increased risk.  The patient has a history of atrial  fibrillation and cardioversion in 2002 with short-term Coumadin.  The  patient reported no ill effects from Coumadin.  The patient also was  restarted on digoxin without a loading dose.  The patient will continue on  digoxin and Cardizem 180 mg CD daily and Coumadin.  Plan for repeat  cardioversion after therapeutic on Coumadin for at least three weeks as  patient has only mild left atrial enlargement and improved ejection  fraction.  The patient will follow up in our clinic for check of INR and  assistance with medications. The patient will see Dr. Deborah Chalk in  approximately three weeks one had been therapeutic on Coumadin.   #2.  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  TEE in  2002 showed an ejection fraction of approximately 25% with moderate atrial  enlargement, moderate mitral regurgitation, and mild aortic insufficiency. Repeat echocardiogram on this visit  estimated ejection fraction from 40 to  50%.  With these findings, we will not at this time place patient back on  ACE inhibitor or beta blocker.  The patient has been off medications  for  approximately one year now and has been doing fairly well until this  episode.   #3.  HYPERTENSION:  Controlled on Cardizem.   #4.  DEPRESSION:  The patient was restarted on Prozac and was given patient  assistance forms to fill in order to aid in getting this medication as an  outpatient.   #5.  TOBACCO:  The patient was counseled on the ill effects of tobacco.  The  patient was caught smoking one time in the bathroom.  Smoking consult was  placed.  The patient declined patch on this visit.   #6.  HISTORY OF INTRAVENOUS DRUG ABUSE:  The patient denies use since 1982.  The patient reports that she has been tested in the past and is HIV negative  and hepatitis negative.  This may again need to be readdressed as an  outpatient.   DISCHARGE LABORATORY AND X-RAY DATA:  WBC 6.1, hemoglobin 13.9, platelets  183. Sodium 142, potassium 4.2, chloride 109, bicarb 25, glucose 92, BUN 17,  creatinine 0.9, calcium 8.2.  INR 1.0.  CK 35, 41. CK-MB 1.3, 1.1.  Troponin  0.08, 0.07.  Total cholesterol 133, triglycerides 51, HDL 31, LDL 92.  TSH  0.143.      Delbert Harness, MD                    Fransisco Hertz, M.D.    JK/MEDQ  D:  06/26/2003  T:  06/27/2003  Job:  [id number]   cc:   Colleen Can. Deborah Chalk, M.D.  Fax: [phone]

## 2010-06-27 NOTE — H&P (Signed)
NAME:  Kelsey Garcia, Kelsey Garcia                            ACCOUNT NO.:  [id number]   MEDICAL RECORD NO.:  [id number]                   PATIENT TYPE:  OIB   LOCATION:  2856                                 FACILITY:  [facility]   PHYSICIAN:  Colleen Can. Deborah Chalk, M.D.            DATE OF BIRTH:  [birth year]   DATE OF ADMISSION:  08/06/2003  DATE OF DISCHARGE:                                HISTORY & PHYSICAL   CHIEF COMPLAINT:  None.   HISTORY OF PRESENT ILLNESS:  [name] is a 65 year old female who has a  known history of atrial fibrillation.  She was hospitalized toward the mid-  part of May with recurrence of atrial fibrillation.  She had been out of her  medicines for over one year due to cost.  She presented with shortness of  breath, PND, and orthopnea and was noted to have atrial fibrillation with a  rapid ventricular response.  She has subsequently had rate control and  Coumadin anticoagulation.  She now presents for elective cardioversion.   PAST MEDICAL HISTORY:  1. Atrial fibrillation.  2. Coumadin therapy with current therapeutic INRs.  3. Nonischemic congestive heart failure with ejection fraction of 40-50%.  4. Hypertension.  5. Depression and anxiety on Prozac.  6. Ongoing tobacco abuse.  7. Previous history of IV drug abuse.  8. History of mitral valve disease.  9. History of migraines.  10.      History of tubal ligation.  11.      History of gallstones.  12.      History of kidney stones.  13.      Chronic insomnia.  14.      Obsessive compulsive disorder.  15.      Prior history of questionable sepsis.   ALLERGIES:  BENADRYL and NYQUIL.   CURRENT MEDICATIONS:  1. Aspirin 325 mg daily.  2. Digoxin 0.25 mg daily.  3. Prozac 30 mg a day.  4. Coumadin.  5. Cardizem CD 180 mg daily.   FAMILY HISTORY:  Unchanged.   SOCIAL HISTORY:  Unchanged.   REVIEW OF SYSTEMS:  Otherwise as noted above and is unremarkable.   PHYSICAL EXAMINATION:  GENERAL:  She is a pleasant,  middle-aged white female  in no acute distress.  VITAL SIGNS:  Height is 5 feet 5-1/2 inches, weight is 176 pounds.  Blood  pressure 130/80, heart rate is in the 90s and irregular, respirations 18,  she is afebrile.  SKIN:  Warm and dry.  Color is unremarkable.  CHEST:  Lungs are somewhat coarse.  CARDIAC:  An irregular irregular rhythm.  ABDOMEN:  Soft, positive bowel sounds, nontender.  EXTREMITIES:  Without edema.  NEUROLOGIC:  Intact.  There are no gross focal deficits.   Pertinent labs are pending.   OVERALL IMPRESSION:  1. Previous atrial fibrillation.  2. Coumadin therapy.  3. History of multisubstance abuse.  4. History of heart failure.  5. Chronic anxiety, depression, and obsessive compulsive disorder.   PLAN:  Will proceed on with elective cardioversion.  The procedure has been  reviewed in full detail, and she is willing to proceed on August 06, 2003.      Kelsey Garcia, N.P.                     Colleen Can. Deborah Chalk, M.D.    LC/MEDQ  D:  08/06/2003  T:  08/06/2003  Job:  [id number]   cc:   Fransisco Hertz, M.D.  [address]  [place] [address]  Fax: [phone]   J. Alexandria Lodge, M.D.   Delbert Harness, MD  Internal Medicine [facility]  [place]  [place] [address]  Fax: [phone]

## 2010-06-30 ENCOUNTER — Other Ambulatory Visit: Payer: Self-pay | Admitting: *Deleted

## 2010-07-02 ENCOUNTER — Other Ambulatory Visit: Payer: Self-pay | Admitting: *Deleted

## 2010-07-02 MED ORDER — HYDROCODONE-ACETAMINOPHEN 5-500 MG PO CAPS: 1.0000 | ORAL_CAPSULE | Freq: Four times a day (QID) | ORAL | Status: DC | PRN

## 2010-07-02 MED ORDER — HYDROCODONE-HOMATROPINE 5-1.5 MG/5ML PO SYRP: 5.0000 mL | ORAL_SOLUTION | Freq: Four times a day (QID) | ORAL | Status: DC | PRN

## 2010-07-02 NOTE — Telephone Encounter (Signed)
Pharmacist states that vicodin 5/500 is on back order, may they give 5/325mg ?

## 2010-07-02 NOTE — Telephone Encounter (Signed)
Please call in rx and inform pt refill submitted to pharm.

## 2010-07-02 NOTE — Telephone Encounter (Signed)
Per dr mills, will let pharm know to fill for 5/325

## 2010-07-15 ENCOUNTER — Encounter: Payer: Medicaid Other | Admitting: Internal Medicine

## 2010-07-16 ENCOUNTER — Encounter: Payer: Self-pay | Admitting: Internal Medicine

## 2010-07-24 ENCOUNTER — Encounter: Payer: Medicaid Other | Admitting: Internal Medicine

## 2010-09-10 ENCOUNTER — Other Ambulatory Visit: Payer: Self-pay | Admitting: *Deleted

## 2010-09-10 DIAGNOSIS — J45909 Unspecified asthma, uncomplicated: Secondary | ICD-10-CM

## 2010-09-10 DIAGNOSIS — J449 Chronic obstructive pulmonary disease, unspecified: Secondary | ICD-10-CM

## 2010-09-10 NOTE — Telephone Encounter (Signed)
Pt called and states she is in [place] at some trial.  Can't get home this month and wants refill on 3 meds. [facility] [phone] in [place].  I can call in. Also she is asking for loratadine not zyrtec.

## 2010-09-11 MED ORDER — DILTIAZEM HCL ER BEADS 420 MG PO CP24: 420.0000 mg | ORAL_CAPSULE | Freq: Every day | ORAL | Status: DC

## 2010-09-11 MED ORDER — CETIRIZINE HCL 10 MG PO CHEW: 10.0000 mg | CHEWABLE_TABLET | Freq: Every day | ORAL | Status: DC

## 2010-09-11 MED ORDER — TIOTROPIUM BROMIDE MONOHYDRATE 18 MCG IN CAPS: 18.0000 ug | ORAL_CAPSULE | Freq: Every day | RESPIRATORY_TRACT | Status: DC

## 2010-09-11 NOTE — Telephone Encounter (Signed)
Rx called to pharmacy in [place].

## 2010-09-24 ENCOUNTER — Other Ambulatory Visit: Payer: Self-pay | Admitting: *Deleted

## 2010-09-24 ENCOUNTER — Observation Stay (HOSPITAL_COMMUNITY)
Admission: EM | Admit: 2010-09-24 | Discharge: 2010-09-25 | Disposition: A | Discharge status: Home / Self Care | Payer: Medicaid Other | Source: Ambulatory Visit | Attending: Internal Medicine | Admitting: Internal Medicine

## 2010-09-24 ENCOUNTER — Emergency Department (HOSPITAL_COMMUNITY): Payer: Medicaid Other

## 2010-09-24 DIAGNOSIS — F429 Obsessive-compulsive disorder, unspecified: Secondary | ICD-10-CM | POA: Insufficient documentation

## 2010-09-24 DIAGNOSIS — J4489 Other specified chronic obstructive pulmonary disease: Secondary | ICD-10-CM | POA: Insufficient documentation

## 2010-09-24 DIAGNOSIS — Z01810 Encounter for preprocedural cardiovascular examination: Secondary | ICD-10-CM | POA: Insufficient documentation

## 2010-09-24 DIAGNOSIS — I10 Essential (primary) hypertension: Secondary | ICD-10-CM | POA: Insufficient documentation

## 2010-09-24 DIAGNOSIS — F17200 Nicotine dependence, unspecified, uncomplicated: Secondary | ICD-10-CM | POA: Insufficient documentation

## 2010-09-24 DIAGNOSIS — Z01812 Encounter for preprocedural laboratory examination: Secondary | ICD-10-CM | POA: Insufficient documentation

## 2010-09-24 DIAGNOSIS — I428 Other cardiomyopathies: Secondary | ICD-10-CM | POA: Insufficient documentation

## 2010-09-24 DIAGNOSIS — J449 Chronic obstructive pulmonary disease, unspecified: Secondary | ICD-10-CM | POA: Insufficient documentation

## 2010-09-24 DIAGNOSIS — F3289 Other specified depressive episodes: Secondary | ICD-10-CM | POA: Insufficient documentation

## 2010-09-24 DIAGNOSIS — M159 Polyosteoarthritis, unspecified: Principal | ICD-10-CM | POA: Insufficient documentation

## 2010-09-24 DIAGNOSIS — G4700 Insomnia, unspecified: Secondary | ICD-10-CM | POA: Insufficient documentation

## 2010-09-24 DIAGNOSIS — I4891 Unspecified atrial fibrillation: Secondary | ICD-10-CM | POA: Insufficient documentation

## 2010-09-24 DIAGNOSIS — F329 Major depressive disorder, single episode, unspecified: Secondary | ICD-10-CM | POA: Insufficient documentation

## 2010-09-24 DIAGNOSIS — Z9981 Dependence on supplemental oxygen: Secondary | ICD-10-CM | POA: Insufficient documentation

## 2010-09-24 LAB — CBC
HCT: 38.2 % (ref 36.0–46.0)
Hemoglobin: 12.8 g/dL (ref 12.0–15.0)
MCH: 32.4 pg (ref 26.0–34.0)
MCHC: 33.5 g/dL (ref 30.0–36.0)

## 2010-09-24 LAB — DIFFERENTIAL
Basophils Relative: 0 % (ref 0–1)
Eosinophils Relative: 3 % (ref 0–5)
Lymphocytes Relative: 25 % (ref 12–46)
Monocytes Absolute: 0.7 10*3/uL (ref 0.1–1.0)
Monocytes Relative: 9 % (ref 3–12)
Neutro Abs: 5.2 10*3/uL (ref 1.7–7.7)

## 2010-09-24 LAB — POCT I-STAT, CHEM 8
BUN: 13 mg/dL (ref 6–23)
Creatinine, Ser: 0.8 mg/dL (ref 0.50–1.10)
Glucose, Bld: 95 mg/dL (ref 70–99)
Sodium: 140 mEq/L (ref 135–145)
TCO2: 28 mmol/L (ref 0–100)

## 2010-09-24 LAB — CK TOTAL AND CKMB (NOT AT ARMC)
CK, MB: 2.2 ng/mL (ref 0.3–4.0)
Total CK: 37 U/L (ref 7–177)

## 2010-09-24 LAB — TROPONIN I: Troponin I: <0.3 ng/mL (ref <0.30)

## 2010-09-24 NOTE — Telephone Encounter (Signed)
She is free to go to ER but that just strengthens my concern that narcs are being used inappropriately.

## 2010-09-24 NOTE — Telephone Encounter (Signed)
Pt just returning from [place], long car ride and having increase back pain.

## 2010-09-24 NOTE — Telephone Encounter (Signed)
Pt returned my call and said she would probably just go to the ED tonight. She said she can not take NSAIDs because they bother her stomach. She also stated " so, I guess she knows more than Dr Arvilla Market.  Does she know I have arthritis?  I asked pt to make appointment if not better after following instructions.

## 2010-09-24 NOTE — Telephone Encounter (Signed)
I will not refill this because I am not comfortable with her narcotic situation. No contract. Pain med need hasn't been addressed recently. Called pharmacies:  1. 8/1 in [place] Oxycodone 10 mg #30 2. 7/23 in [place] Norco 5 mg #60 3. [facility] of [facility]105 hydorocodone in May 4. [facility] of [facility]90 hydrocodone in April 5. [facility] of [facility]90 hydrocodone in March  Based on this she is on "chronic" narcotics without any contract, without any monitoring or oversight. Have added an FYI so that any future requests trigger a closer review. Suggest the following 1. Use NSAIDs, heat, gentle stretching, and time to recover from trip 2. Her PCP needs to address narc usage, and if intends to cont, contract and UDS 3. If she does not improve with #1, she needs an appt to address

## 2010-09-25 ENCOUNTER — Encounter: Payer: Self-pay | Admitting: Ophthalmology

## 2010-09-25 ENCOUNTER — Observation Stay (HOSPITAL_COMMUNITY): Payer: Medicaid Other

## 2010-09-25 DIAGNOSIS — M2550 Pain in unspecified joint: Secondary | ICD-10-CM

## 2010-09-25 DIAGNOSIS — I4891 Unspecified atrial fibrillation: Secondary | ICD-10-CM

## 2010-09-25 DIAGNOSIS — I517 Cardiomegaly: Secondary | ICD-10-CM

## 2010-09-25 LAB — TSH: TSH: 0.62 u[IU]/mL (ref 0.350–4.500)

## 2010-09-25 LAB — CBC
HCT: 34.3 % — ABNORMAL LOW (ref 36.0–46.0)
MCV: 97.2 fL (ref 78.0–100.0)
Platelets: 271 10*3/uL (ref 150–400)
RBC: 3.53 MIL/uL — ABNORMAL LOW (ref 3.87–5.11)
WBC: 8 10*3/uL (ref 4.0–10.5)

## 2010-09-25 LAB — COMPREHENSIVE METABOLIC PANEL
AST: 11 U/L (ref 0–37)
CO2: 32 mEq/L (ref 19–32)
Chloride: 105 mEq/L (ref 96–112)
Creatinine, Ser: 0.64 mg/dL (ref 0.50–1.10)
GFR calc non Af Amer: >60 mL/min (ref >60)
Total Bilirubin: 0.4 mg/dL (ref 0.3–1.2)

## 2010-09-25 LAB — URINALYSIS, ROUTINE W REFLEX MICROSCOPIC
Ketones, ur: NEGATIVE mg/dL
Leukocytes, UA: NEGATIVE
Nitrite: NEGATIVE
Protein, ur: NEGATIVE mg/dL
Urobilinogen, UA: 1 mg/dL (ref 0.0–1.0)

## 2010-09-25 LAB — HEMOGLOBIN A1C
Hgb A1c MFr Bld: 5.5 % (ref <5.7)
Mean Plasma Glucose: 111 mg/dL (ref <117)

## 2010-09-25 LAB — CARDIAC PANEL(CRET KIN+CKTOT+MB+TROPI): CK, MB: 2.3 ng/mL (ref 0.3–4.0)

## 2010-09-25 LAB — RAPID URINE DRUG SCREEN, HOSP PERFORMED: Amphetamines: NOT DETECTED

## 2010-09-25 MED ORDER — IOHEXOL 300 MG/ML  SOLN
80.0000 mL | Freq: Once | INTRAMUSCULAR | Status: AC | PRN
  Administered 2010-09-25: 80 mL via INTRAVENOUS
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
## 2010-09-29 ENCOUNTER — Telehealth: Payer: Self-pay | Admitting: *Deleted

## 2010-09-29 NOTE — Telephone Encounter (Signed)
Pt calls to state she would like to apologize for her behavior recently while being seen inhouse. She states she just was hurting so bad. She will try to be here for her appt.

## 2010-09-29 NOTE — Telephone Encounter (Signed)
I did not see her while she was hospitalized so I do not know what the issues were. Thanks
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
## 2010-10-03 ENCOUNTER — Encounter: Payer: Self-pay | Admitting: Internal Medicine

## 2010-10-03 ENCOUNTER — Ambulatory Visit (INDEPENDENT_AMBULATORY_CARE_PROVIDER_SITE_OTHER): Payer: Medicaid Other | Admitting: Internal Medicine

## 2010-10-03 VITALS — BP 119/75 | HR 102 | Temp 97.3°F | Ht 60.0 in | Wt 221.8 lb

## 2010-10-03 DIAGNOSIS — F329 Major depressive disorder, single episode, unspecified: Secondary | ICD-10-CM

## 2010-10-03 DIAGNOSIS — J019 Acute sinusitis, unspecified: Secondary | ICD-10-CM

## 2010-10-03 DIAGNOSIS — R52 Pain, unspecified: Secondary | ICD-10-CM

## 2010-10-03 DIAGNOSIS — J449 Chronic obstructive pulmonary disease, unspecified: Secondary | ICD-10-CM

## 2010-10-03 DIAGNOSIS — F17200 Nicotine dependence, unspecified, uncomplicated: Secondary | ICD-10-CM

## 2010-10-03 MED ORDER — HYDROCODONE-ACETAMINOPHEN 5-500 MG PO TABS: 1.0000 | ORAL_TABLET | Freq: Four times a day (QID) | ORAL | Status: DC | PRN

## 2010-10-03 MED ORDER — ESOMEPRAZOLE MAGNESIUM 20 MG PO CPDR: 20.0000 mg | DELAYED_RELEASE_CAPSULE | Freq: Every day | ORAL | Status: DC

## 2010-10-03 MED ORDER — HYDROCODONE-HOMATROPINE 5-1.5 MG/5ML PO SYRP: 5.0000 mL | ORAL_SOLUTION | Freq: Four times a day (QID) | ORAL | Status: DC | PRN

## 2010-10-03 NOTE — Assessment & Plan Note (Signed)
Not in exacerbation at today's visit.

## 2010-10-03 NOTE — Assessment & Plan Note (Signed)
I did encourage patient to stop smoking.

## 2010-10-03 NOTE — Progress Notes (Signed)
Subjective:    Patient ID: Kelsey Garcia, female    DOB: [birth year], 65 y.o.   MRN: [id number]  HPI: The patient is a 65 year old female comes in today for hospital followup visit. She was hospitalized for pain acutely after traveling 5 hours via car from [place]. She was found to be in A. fib on admission and was seen by cardiology during his overnight stay. It was decided that she would not start Coumadin as she did not wish to be anticoagulated at this time. She will followup with cardiology in November. Patient does voice understanding of the risks and benefits to Coumadin. She is able to make a decision regarding not taking Coumadin. She has been seen in our clinic chronically for pain in her hips from degenerative joint disease. She states that she was getting 60 Vicodin from Dr. Arvilla Market every month. I have seen this documented in the past. She does not have a medication contract. I did discuss with her the need to be on a medication contract. She also gets a hydrocodone cough syrup when she needs it. I discussed in depth with her regarding her usage of these medications. She was given 10 Vicodin from the hospital and she has used 9 of them in the past 8 days. She states that she will take a teaspoon of the cough syrup 3 times a day when she is having a bad cough but may go weeks without taking it. I did discuss all the aspects of the pain contract with her including having her bring in all of her bottles of medications at next visit. She did verbalize understanding and did agree to go to only one pharmacy. I did give her one month supply of Vicodin and her cough syrup until she can get to visit with her PCP who is Dr. Arvilla Market. At that time I told her she may have a change in her pain medication contract as per Dr. Roselie Skinner recommendations. She is not having any acute problems today except pain. She states that her mood is okay, and she's not feeling suicidal. Her breathing is good.    Review of Systems    Constitutional: Negative.  Negative for fever, chills, diaphoresis, activity change, appetite change, fatigue and unexpected weight change.  HENT: Negative.   Eyes: Negative.   Respiratory: Positive for cough. Negative for apnea, choking, chest tightness, shortness of breath, wheezing and stridor.        Patient has chronic cough. No change to cough at today's visit.  Cardiovascular: Negative.  Negative for chest pain, palpitations and leg swelling.  Gastrointestinal: Negative.  Negative for nausea, vomiting, abdominal pain, diarrhea and constipation.  Genitourinary: Negative.   Musculoskeletal: Positive for arthralgias. Negative for myalgias, back pain, joint swelling and gait problem.  Skin: Negative.   Neurological: Negative.   Hematological: Negative.   Psychiatric/Behavioral: Negative.     Vitals: BP: 119/75 Pulse: 102 Temperature 97.3 Fahrenheit    Objective:   Physical Exam  Constitutional: She is oriented to person, place, and time. She appears well-developed and well-nourished.  HENT:  Head: Normocephalic and atraumatic.  Eyes: EOM are normal. Pupils are equal, round, and reactive to light.  Neck: Normal range of motion. Neck supple. No tracheal deviation present. No thyromegaly present.  Cardiovascular: Normal rate, regular rhythm and normal heart sounds.        Patient not in A. fib at today's visit.  Pulmonary/Chest: Effort normal and breath sounds normal. No respiratory distress. She has no wheezes. She  has no rales.  Abdominal: Soft. Bowel sounds are normal. She exhibits no distension. There is no tenderness. There is no rebound and no guarding.  Musculoskeletal: Normal range of motion. She exhibits tenderness. She exhibits no edema.  Lymphadenopathy:    She has no cervical adenopathy.  Neurological: She is alert and oriented to person, place, and time. No cranial nerve deficit.  Skin: Skin is warm and dry. No rash noted. No erythema. No pallor.  Psychiatric: She  has a normal mood and affect. Her behavior is normal. Judgment and thought content normal.          Assessment & Plan:  1. Hospital followup-patient was seen for hospital followup visit. I did give her a refill for one month supply pain medication until she is able to get to visit with her PCP. I did give her information about him contract and did sign a pain contract with her. Copy of this pain contract was given to her at today's visit. She did verbalize understanding and was very cooperative with this conversation. There was some note of concern because she has been filling in medication in [place], however she has been in [place] because of her son-in-law's murder trial for the past month so this would explain why she's been getting pain medication in [place]. I would however caution the following doctors to check a report on her narcotic usage. I did give her 45 Vicodin for one month. And 240 mL of [place] cough syrup for the month. She does understand that she cannot get early refills, and only her PCP can make changes to this contract. She may not call in for changes or refills early, that she must bring her medications to next visit, that she may only use one pharmacy. She states her pain is fairly well controlled and is not keeping her from activities of daily living at this time. She states that in the past has been less controlled. She will go back to see cardiology in November to followup with her A. fib. She does not wish to have anticoagulation at this time. No acute issues from followup.  2. Please see problem-oriented charting.  3. Other medical issues not discussed at today's visit: Hyperlipidemia, OCD, hypertension, cardiomyopathy, sinusitis, bronchitis, COPD, insomnia.  4. Disposition-patient will be seen back for next available visit with Dr. Arvilla Market. At that time please evaluate her pain contract and pain control on current level of medication. We did check a UDS at today's visit  and she should have been taking opiates on the UDS.

## 2010-10-03 NOTE — Assessment & Plan Note (Signed)
Patient states that she is occasionally tearful and did cry this morning quite a bit, however in the past has been crying for days and days. So it does seem that her mood is better controlled on Prozac. She is not feeling suicidal or homicidal at this time.

## 2010-10-03 NOTE — Patient Instructions (Signed)
You were seen for a hospital follow up visit. Please keep your visit with the heart doctor in November. You signed a copy of a pain contract today. Your regular doctor, Dr. Arvilla Market may make some changes to it at your next visit. You were given a copy of it today. You may only get pain medication from the one pharmacy that we agreed upon. We will see you back in about 1 month to see your regular doctor. There were no changes to your medications today. If you have any questions or would like to be seen sooner please call our office. Our number is [phone].

## 2010-10-03 NOTE — Assessment & Plan Note (Signed)
Patient states that cetirizine is not helping much, she would like to switch to Singulair at some point in the future. I advised her that we would put that off for this visit and have her discuss this with her PCP, Dr. Arvilla Market.

## 2010-10-04 LAB — DRUGS OF ABUSE SCREEN W/O ALC, ROUTINE URINE
Benzodiazepines.: NEGATIVE
Cocaine Metabolites: NEGATIVE
Creatinine,U: 74.9 mg/dL
Opiate Screen, Urine: POSITIVE — AB
Phencyclidine (PCP): NEGATIVE
Propoxyphene: NEGATIVE

## 2010-10-08 LAB — OPIATE, QUANTITATIVE, URINE
Codeine Urine: NEGATIVE NG/ML
Hydrocodone: 1670 NG/ML — ABNORMAL HIGH
Hydromorphone - Total: NEGATIVE NG/ML

## 2010-10-15 ENCOUNTER — Telehealth: Payer: Self-pay | Admitting: *Deleted

## 2010-10-15 NOTE — Telephone Encounter (Signed)
Pt calls and states she has some family "stuff" going on and she is very sad, states she needs "some mental health tweaking" she is ask to come in for appt and states she cannot do so until tues 9/ 11. This is due to transportation. She is ask if she could go somewhere in the evening and she states possibly, it is suggested that she go to [facility] for mental health eval. She also ask if there are private psychiatrists that take medicaid, i do not know of any? i told her i would speak w/ physician and call her back. She denies feelings of harm to self or others just "sad". Please advise

## 2010-10-15 NOTE — Telephone Encounter (Signed)
Agree with referrals to her PCP and to [facility].

## 2010-10-17 ENCOUNTER — Telehealth: Payer: Self-pay | Admitting: Licensed Clinical Social Worker

## 2010-10-17 NOTE — Telephone Encounter (Signed)
I have called Kelsey Garcia this Am and educated her about the new [facility] where she can get assessed for psychiatry and also counseling.  She knows the walk-in hours and also has their phone number.  I have asked her to call me back if this does not meet her needs.   Patient confirms she is Medicaid only and this will be a good place for her to access MH care .

## 2010-10-18 NOTE — Discharge Summary (Signed)
NAMEASHTIN, Kelsey Garcia NO.:  [id number]  MEDICAL RECORD NO.:  [id number]  LOCATION:  3704                         FACILITY:  [facility]  PHYSICIAN:  Ileana Roup, M.D.  DATE OF BIRTH:  07/17/1945  DATE OF ADMISSION:  09/24/2010 DATE OF DISCHARGE:  09/25/2010                              DISCHARGE SUMMARY  CC: [facility]PC Nelda Bucks, MD, Internal Medicine Teaching Service.     CONSULTANTS:  Peter C. [name], MD, [facility]  DISCHARGE DIAGNOSES: 1. Atrial fibrillation. 2. Nonischemic cardiomyopathy, with EF of 60-65% during     this admission. 3. Hypertension. 4. Chronic obstructive pulmonary disease with home O2 2L [place]. 5. Chronic pain secondary to osteoarthritis. 6. Depression with a history of posttraumatic stress disorder. 7. Obsessive-compulsive disorder. 8. Insomnia. 9.Current smoker.  DISCHARGE MEDICATIONS: 1. Aspirin, enteric coated 325 mg,  2 tablets p.o. every morning. 2. Cetirizine 10 mg p.o. daily. 3. Cardizem CD 420 mg p.o. every morning. 4. Furosemide 40 mg tablet 1/2 tablet p.o.twice daily morning     and noon. 5. Fluoxetine 20 mg, 3 capsules p.o. every morning. 6. Hydrocodone 5 mg/homatropine, 1.5 mg/5 mL one tablespoon p.o.     every 6 hours as needed for dry cough. 7. Hydrocodone/APAP 5/325 mg p.o.everyy 8 hours as needed     for pain.  (see below regarding controlled medication concerns) 8. Nexium 40 mg p.o. daily. 9. Potassium chloride 20 mEq 1/2tablet p.o.twice daily. 10.Spiriva 18 mcg 1 capsule inhale daily. 11.Symbicort 160/4.5 mcg 2 puffs inhale twice daily. 12.Albuterol inhaler 90 mcg 2 puffs inhale every 4 hours as needed     for shortness of breath/wheezing.  DISPOSITION AND FOLLOWUP: 1. [facility]. I have set up an appointment for the patient; however, she refused it and stated that she would call to make an appointment for next week on the day when she has transportation.  The patient will need to  have her pain medications and UDS evaluated.  Please refer to Dr. Donnelly Stager note on September 24, 2010.  I have only given her a prescription of hydrocodone/APAP 5/325 mg 10 tablets to last her  until she sees [place] at our outpatient clinic.  She stated that she would call clinic to make appointment in next 3-4 days.  2. Dr. Charlton Haws. Dr. [name] was consulted on the day of discharge and instructed that he would make an appointment for the patient to see him in 3 months.  Dr. [name] will discuss with her about atrial fibrillation control and possible usage of blood thinner. The patient currently refused blood thinner.  PROCEDURE:  None was done during this admission.  CONSULTATION:  [facility], Dr. Charlton Haws.  BRIEF ADMITTING HISTORY AND PHYSICAL: The patient is a 65 year old woman with a history of DJD of the hip who presents with hip and knee pain following a 5-hour drive in a [employer].  She reports having end-stage arthritis and is currently out of her home pain medication of hydrocodone and requesting a refill. When in ED, she was found to be in atrial fibrillation and not currently on any anticoagulation.  She reports having a history of atrial  fibrillation first diagnosed in 1978 and having been cardioverted on two prior occasions in 2002 and February 2004.  She also has a history of dilated cardiomyopathy with her last echo in 2010 showing a normal EF of 60-65% with a mild mitral regurgitation.  She is reluctant to start Coumadin and has only been on Coumadin for a few weeks prior to cardioversion in the past.  She denies chest pain, increased shortness of breath, increased edema, or palpitations.  PHYSICAL EXAMINATION:   VITAL SIGNS:  T=98.3, HR=83, BP=117/60, RR=18. GENERAL:  A pleasant obese woman resting in bed, in no apparent distress. HEENT:  PERRL, EOMI, no scleral icterus. CARDIAC:  Irregularly irregular rhythm.  No rubs, murmurs, or gallops. PULMONARY:  Clear  to auscultation bilaterally, moving normal volumes of air. ABDOMEN:  Soft, nontender, nondistended, bowel sounds present. EXTREMITIES:  Warm and well perfused, no pedal edema. NEURO:  Alert and oriented x3.  Cranial nerves II through XII grossly intact, strength and sensation to light touch equal in bilateral upper and lower extremities.  ADMITTING LABORATORY DATA:   WBC 8.2, HGB 12.8, HCT 38.2, PLT 299. sodium 140, potassium 3.4, chloride 105, CO2 of 32, glucose 159, BUN 12, creatinine 0.64, alkaline phosphatase 118, AST and ALT within normal limits, total protein 5.8, albumin 2.8, calcium 8.7.  TSH 0.620 WNL. HgbA1C 5.5.Alcohol level <11.Cardiac enzymes negative x2. Urine, UA negative, UDS positive for opiates.  IMAGING STUDY:  Chest x-ray: 1. Nodular density in lingual is present on the prior CT scan of Jun 09, 2009, and it was expected representing atelectasis or scar at     that time.  They some adjacent old rib fractures.  Given that this     lesion was not readily apparent on the early exam, such as March 29, 2009, recommended followup chest CT. 2. Borderline cardiomegaly.  Chest CT impression:Radiographic abnormality corresponds to lingular scarring.  No suspicious pulmonary nodules.  2D echo:The estimated ejection fraction was in the range of 60% to 65%.  HOSPITAL COURSE BY PROBLEMS: 1. Atrial fibrillation. The patient is essentially asymptomatic with a rate-controlled chronic atrial fibrillation.  Denies any chest pain or palpitation.  Assessment benign.  Cardiac enzymes negative x2, declined anticoagulation therapy with CHADS score of 2.  [place] cardiologist, Dr. [name] was consulted and instructed her to follow up with him in 3 months as an outpatient. 2. Chronic pain. Dr. Rogelia Boga documented recent concerns that patient should be evaluated in clinic regarding the need for chronic narcotic pain medication prior to continuing chronic narcotics for pain, and  also that if chronic opioids are continued, a  pain contract should be completed. This patient should not be prescribed any narcotic refills without an office visit for reevaluation and UDS.  Also, she needs to sign the pain contract.  Please refer to Dr. Donnelly Stager office note on September 24, 2010. 3. Hypertension, stable and remained on current treatment. 4. Nonischemic cardiomyopathy, EF 60-65% during this admission.  No     acute issues, and Dr. [name] will follow her up. 5. COPD, stable. Chest CT shows mild patchy opacity in the posterior right upper lobe, possibly infectious/inflammatory.  Given her chronic history of COPD with no acute issues at present, she will need a follow up as an outpatient.  May consider to repeat chest x-ray if the patient becomes symptomatic. 6. Depression/OCD/insomnia, stable. 7. Current smoker. The patient declined to stop smoking after she was given smoking cessation education.  She will need followup on this matter as an outpatient.  DISCHARGE VITAL SIGNS:  Temperature 97.6, HR 82, RR 20, BP 122/86, oxygen saturation 96% on 2 L nasal cannula.    ______________________________ Dede Query, MD   ______________________________ Ileana Roup, M.D.    NL/MEDQ  D:  09/27/2010  T:  09/27/2010  Job:  [id number]  cc:   Nelda Bucks, MD  Electronically Signed by Dede Query MD on 10/08/2010 08:22:21 PM Electronically Signed by Margarito Liner M.D. on 10/18/2010 03:08:09 PM

## 2010-10-28 ENCOUNTER — Encounter: Payer: Medicaid Other | Admitting: Internal Medicine

## 2010-10-30 ENCOUNTER — Ambulatory Visit (INDEPENDENT_AMBULATORY_CARE_PROVIDER_SITE_OTHER): Payer: Medicaid Other | Admitting: Internal Medicine

## 2010-10-30 VITALS — BP 151/92 | HR 96 | Temp 97.1°F | Resp 24 | Ht 64.5 in | Wt 227.9 lb

## 2010-10-30 DIAGNOSIS — J449 Chronic obstructive pulmonary disease, unspecified: Secondary | ICD-10-CM

## 2010-10-30 DIAGNOSIS — M1990 Unspecified osteoarthritis, unspecified site: Secondary | ICD-10-CM

## 2010-10-30 DIAGNOSIS — F419 Anxiety disorder, unspecified: Secondary | ICD-10-CM | POA: Insufficient documentation

## 2010-10-30 DIAGNOSIS — I10 Essential (primary) hypertension: Secondary | ICD-10-CM

## 2010-10-30 DIAGNOSIS — Z0000 Encounter for general adult medical examination without abnormal findings: Secondary | ICD-10-CM | POA: Insufficient documentation

## 2010-10-30 DIAGNOSIS — E876 Hypokalemia: Secondary | ICD-10-CM | POA: Insufficient documentation

## 2010-10-30 DIAGNOSIS — F411 Generalized anxiety disorder: Secondary | ICD-10-CM

## 2010-10-30 DIAGNOSIS — I4891 Unspecified atrial fibrillation: Secondary | ICD-10-CM

## 2010-10-30 LAB — COMPREHENSIVE METABOLIC PANEL
Albumin: 4 g/dL (ref 3.5–5.2)
Alkaline Phosphatase: 108 U/L (ref 39–117)
BUN: 15 mg/dL (ref 6–23)
CO2: 30 mEq/L (ref 19–32)
Glucose, Bld: 97 mg/dL (ref 70–99)
Sodium: 142 mEq/L (ref 135–145)
Total Bilirubin: 0.4 mg/dL (ref 0.3–1.2)
Total Protein: 6.8 g/dL (ref 6.0–8.3)

## 2010-10-30 MED ORDER — HYDROCODONE-ACETAMINOPHEN 5-500 MG PO TABS: 1.0000 | ORAL_TABLET | Freq: Four times a day (QID) | ORAL | Status: DC | PRN

## 2010-10-30 MED ORDER — DIAZEPAM 2 MG PO TABS
2.0000 mg | ORAL_TABLET | Freq: Three times a day (TID) | ORAL | Status: DC | PRN
Start: 2010-10-30 — End: 2010-12-10

## 2010-10-30 MED ORDER — MONTELUKAST SODIUM 10 MG PO TABS: 10.0000 mg | ORAL_TABLET | Freq: Every day | ORAL | Status: DC

## 2010-10-30 MED ORDER — POTASSIUM CHLORIDE 20 MEQ PO PACK: 20.0000 meq | PACK | Freq: Every day | ORAL | Status: DC

## 2010-10-30 NOTE — Assessment & Plan Note (Signed)
Patient states she's been out of her potassium chloride supplements for the past 3 days. Will submit a refill for this medication today. We'll also check a metabolic panel to assess her electrolyte status.  More than 30 minutes with at least 50% face-to-face time spent counseling patient about her chronic medical issues and concerns.

## 2010-10-30 NOTE — Assessment & Plan Note (Signed)
Patient is following with Dr. [name]. She was recently admitted to the hospital where she was discovered to have A. fib with RVR. She is doing well on diltiazem. On exam today she has a normal rate and regular rhythm. She is considering Coumadin therapy however is reluctant to do so she is concerned about the risk of potentially fatal bleed and/or consequences of a CNS bleed.  Discussed risks and benefits of Coumadin therapy; I will support her if she decides to forego Coumadin therapy but encouraged her to take time to consider the options.  Will followup recommendations from her next office visit with Dr.Nishan

## 2010-10-30 NOTE — Assessment & Plan Note (Signed)
Patient is under a considerable amount of stress. Her son-in-law was recently murdered and she was present at all court proceedings; the process has not been finalized.  There are some very stressful custody issues regarding her grandchildren whom she was very close with. She denies suicidal or homicidal ideation. Will prescribe some Valium for the short-term to help her with her stress and anxiety and hopefully improve her sleep.  Will follow her up soon to assess response to this.

## 2010-10-30 NOTE — Assessment & Plan Note (Signed)
Patient declines annual flu vaccination, pneumo vax, and tdap.

## 2010-10-30 NOTE — Progress Notes (Signed)
  Subjective:    Patient ID: Kelsey Garcia, female    DOB: 01-03-1946, 65 y.o.   MRN: [id number]  HPI Please see the A&P for the status of the pt's chronic medical problems.  Patient reports that her grandchildren were taken away in a custody dispute and are currently living in [place]. She and her daughter are in the weekend to get the kids back; they have an attorney and has been going to [place] monthly to attend court hearings.  She is incredibly upset about this and has significant difficulty sleeping.  She states she is often tearful during the day but feels the need to be strong for her daughter. She is under a significant amount of stress with some other family issues. She denies suicidal or homicidal ideation and is interested in something to help her anxiety and stress to be used on a short-term basis.    Review of Systems  Constitutional: Negative for fever, chills, diaphoresis, activity change, appetite change, fatigue and unexpected weight change.  HENT: Negative for hearing loss, congestion and neck stiffness.   Eyes: Negative for photophobia, pain and visual disturbance.  Respiratory: Negative for cough, chest tightness, shortness of breath and wheezing.   Cardiovascular: Negative for chest pain and palpitations.  Gastrointestinal: Negative for abdominal pain, blood in stool and anal bleeding.  Genitourinary: Negative for dysuria, hematuria and difficulty urinating.  Musculoskeletal: Negative for joint swelling.  Neurological: Negative for dizziness, syncope, speech difficulty, weakness, numbness and headaches.  Psychiatric/Behavioral: Positive for sleep disturbance and dysphoric mood. Negative for suicidal ideas, hallucinations, behavioral problems, confusion and self-injury. The patient is nervous/anxious. The patient is not hyperactive.        Objective:   Physical Exam VItal signs reviewed and stable.  Blood pressure slightly elevated above goal. GEN: No apparent distress.   Alert and oriented x 3.  Pleasant although  tearful and very upset at times during the exam, conversant, and cooperative to exam. HEENT: head is autraumatic and normocephalic.  Neck is supple without palpable masses or lymphadenopathy.  No JVD or carotid bruits.  Vision intact.  EOMI.  PERRLA.  Sclerae anicteric.  Conjunctivae without pallor or injection. Mucous membranes are moist.  Oropharynx is without erythema, exudates, or other abnormal lesions.   RESP:  Decreased breath sounds bilaterally but good air movement. .  No wheezes, ronchi, or rubs. CARDIOVASCULAR: regular rate, normal rhythm.  Clear S1, S2, no murmurs, gallops, or rubs. ABDOMEN: soft, non-tender, non-distended.  Bowels sounds present in all quadrants and normoactive.  No palpable masses. EXT: warm and dry.  No clubbing or cyanosis.  No edema in bilateral lower extremities. SKIN: warm and dry with normal turgor.  No rashes or abnormal lesions observed. NEURO: No focal deficit        Assessment & Plan:

## 2010-10-30 NOTE — Assessment & Plan Note (Signed)
Patient is stable at her baseline. She denies increased cough, increased sputum production, worsening shortness of breath, or increased wheezing. She is on oxygen continuously at home and has not needed to increase this. She does note that her sinuses are often congested; she experiences seasonal allergies with symptoms during the fall.  She is currently taking Zyrtec but does not feel it is working well. She often gets congested at night is concerned she's not getting adequate oxygen will she sleeps. She notes that Singulair worked very well in the past and she wishes to try this again. I will write a prescription and fell any necessary paperwork for Medicaid to help obtain Singulair for her.  We'll continue her SPIRIVA and Symbicort

## 2010-10-30 NOTE — Assessment & Plan Note (Signed)
Patient has degenerative joint disease and is uninterested in pursuing surgical options. Her pain has been well controlled with Vicodin for the past 3+ years.  She is somewhat upset regarding the need for medication contract bed at expresses understanding and willingness to sign a contract. Will update her current contract to reflect the usual dose and number of Vicodin she receives per month. We'll also include her cough syrup on this.

## 2010-10-30 NOTE — Patient Instructions (Signed)
A followup appointment with Dr. Arvilla Market in 3-6 months, sooner if needed. Continue to take all of your medications as directed I will call if any of your blood work is abnormal

## 2010-10-30 NOTE — Assessment & Plan Note (Signed)
Blood pressure is slightly elevated above goal today although this is understandable in the context of her emotional state. Will continue her current medication regimen and check [place] a metabolic panel today to assess her electrolyte status and renal function. If her blood pressure remains elevated may need to consider addition of another antihypertensive agent.

## 2010-10-31 LAB — CBC
HCT: 46.9 — ABNORMAL HIGH
Platelets: 261
RBC: 4.93
WBC: 12.2 — ABNORMAL HIGH

## 2010-10-31 LAB — DIFFERENTIAL
Lymphocytes Relative: 22
Lymphs Abs: 2.7
Monocytes Relative: 6
Neutrophils Relative %: 71

## 2010-10-31 LAB — POCT CARDIAC MARKERS
Myoglobin, poc: 45
Operator id: 161631
Troponin i, poc: <0.05

## 2010-10-31 LAB — B-NATRIURETIC PEPTIDE (CONVERTED LAB): Pro B Natriuretic peptide (BNP): 57

## 2010-11-03 ENCOUNTER — Other Ambulatory Visit: Payer: Self-pay | Admitting: *Deleted

## 2010-11-03 MED ORDER — HYDROCODONE-HOMATROPINE 5-1.5 MG/5ML PO SYRP: 5.0000 mL | ORAL_SOLUTION | Freq: Four times a day (QID) | ORAL | Status: DC | PRN

## 2010-11-03 NOTE — Telephone Encounter (Signed)
Last refill 8/24 Pt # [phone]

## 2010-11-03 NOTE — Telephone Encounter (Signed)
Hi!  Rx is approved. WOuld you please call this in to her pharmacy?  Thanks!

## 2010-11-04 NOTE — Telephone Encounter (Signed)
Rx called in and pt informed 

## 2010-11-14 LAB — COMPREHENSIVE METABOLIC PANEL
ALT: 18 U/L (ref 0–35)
Albumin: 3.7 g/dL (ref 3.5–5.2)
Alkaline Phosphatase: 73 U/L (ref 39–117)
Glucose, Bld: 72 mg/dL (ref 70–99)
Potassium: 3.6 mEq/L (ref 3.5–5.1)
Sodium: 142 mEq/L (ref 135–145)
Total Protein: 6.6 g/dL (ref 6.0–8.3)

## 2010-11-14 LAB — POCT I-STAT, CHEM 8
BUN: 23 mg/dL (ref 6–23)
Calcium, Ion: 1.16 mmol/L (ref 1.12–1.32)
Chloride: 109 meq/L (ref 96–112)
Creatinine, Ser: 0.9 mg/dL (ref 0.4–1.2)
Glucose, Bld: 86 mg/dL (ref 70–99)
HCT: 50 % — ABNORMAL HIGH (ref 36.0–46.0)
Hemoglobin: 17 g/dL — ABNORMAL HIGH (ref 12.0–15.0)
Potassium: 3.9 meq/L (ref 3.5–5.1)
Sodium: 144 mEq/L (ref 135–145)
TCO2: 28 mmol/L (ref 0–100)

## 2010-11-14 LAB — DIFFERENTIAL
Basophils Absolute: 0 10*3/uL (ref 0.0–0.1)
Basophils Relative: 1 % (ref 0–1)
Eosinophils Absolute: 0.1 K/uL (ref 0.0–0.7)
Eosinophils Relative: 2 % (ref 0–5)
Lymphocytes Relative: 36 % (ref 12–46)
Lymphs Abs: 2.9 K/uL (ref 0.7–4.0)
Monocytes Absolute: 0.6 K/uL (ref 0.1–1.0)
Monocytes Relative: 7 % (ref 3–12)
Neutro Abs: 4.5 10*3/uL (ref 1.7–7.7)
Neutrophils Relative %: 55 % (ref 43–77)

## 2010-11-14 LAB — CK TOTAL AND CKMB (NOT AT ARMC)
CK, MB: 3 ng/mL (ref 0.3–4.0)
Relative Index: INVALID (ref 0.0–2.5)
Total CK: 42 U/L (ref 7–177)

## 2010-11-14 LAB — B-NATRIURETIC PEPTIDE (CONVERTED LAB): Pro B Natriuretic peptide (BNP): 153 pg/mL — ABNORMAL HIGH (ref 0.0–100.0)

## 2010-11-14 LAB — COMPREHENSIVE METABOLIC PANEL WITH GFR
AST: 23 U/L (ref 0–37)
BUN: 19 mg/dL (ref 6–23)
CO2: 26 meq/L (ref 19–32)
Calcium: 9.3 mg/dL (ref 8.4–10.5)
Chloride: 108 meq/L (ref 96–112)
Creatinine, Ser: 0.82 mg/dL (ref 0.4–1.2)
GFR calc Af Amer: >60 mL/min (ref >60)
GFR calc non Af Amer: >60 mL/min (ref >60)
Total Bilirubin: 0.6 mg/dL (ref 0.3–1.2)

## 2010-11-14 LAB — CBC
HCT: 48.1 % — ABNORMAL HIGH (ref 36.0–46.0)
Hemoglobin: 16 g/dL — ABNORMAL HIGH (ref 12.0–15.0)
MCHC: 33.2 g/dL (ref 30.0–36.0)
MCV: 96.6 fL (ref 78.0–100.0)
Platelets: 197 10*3/uL (ref 150–400)
RBC: 4.98 MIL/uL (ref 3.87–5.11)
RDW: 14.3 % (ref 11.5–15.5)
WBC: 8.1 10*3/uL (ref 4.0–10.5)

## 2010-11-14 LAB — POCT CARDIAC MARKERS
CKMB, poc: 1.5 ng/mL (ref 1.0–8.0)
Myoglobin, poc: 48.5 ng/mL (ref 12–200)
Troponin i, poc: <0.05 ng/mL (ref 0.00–0.09)

## 2010-11-14 LAB — PROTIME-INR
INR: 1 (ref 0.00–1.49)
Prothrombin Time: 13.7 seconds (ref 11.6–15.2)

## 2010-11-14 LAB — APTT: aPTT: 34 seconds (ref 24–37)

## 2010-11-14 LAB — TROPONIN I: Troponin I: <0.01 ng/mL (ref 0.00–0.06)

## 2010-11-14 LAB — TSH: TSH: 1.288 u[IU]/mL (ref 0.350–4.500)

## 2010-12-03 ENCOUNTER — Telehealth: Payer: Self-pay | Admitting: *Deleted

## 2010-12-03 NOTE — Telephone Encounter (Signed)
Pt called stating [facility] will not fill her O2 tanks at home, pt needs to go to office.  She is not able to do this so wants to change delivery companies. She will need new orders sent to [facility] [phone] They need info sent to them. Fax [phone]

## 2010-12-03 NOTE — Telephone Encounter (Signed)
I would be be happy to submit orders; do you know what specific orders they need/want?  Please let me know and I will get them entered.  THanks!

## 2010-12-04 NOTE — Telephone Encounter (Signed)
6 stand up tanks, regular size. Plus 1 big tank for emergencies. A condenser.(Serial # [id number]) I will call [facility] in am and get more directions.

## 2010-12-05 NOTE — Telephone Encounter (Signed)
Kelsey Garcia needs information from [facility] stating exactly what pt was getting.  I called [facility] and they can't send the information until "release of info" is signed.  Pt informed and explained why.  Will wait for pt to sign form.  Even with the signature I don't think Medicaid will pay for this change over.

## 2010-12-10 ENCOUNTER — Other Ambulatory Visit: Payer: Self-pay | Admitting: Internal Medicine

## 2010-12-10 NOTE — Telephone Encounter (Signed)
Pt also wants a refill on her cough medication Hydrocod/HOM 5-1.5/Syrup 240 cc . Take 1 teaspoonful by mouth every 6 hours as needed for cough.  Last dispensed 11/04/2010.

## 2010-12-11 MED ORDER — DIAZEPAM 2 MG PO TABS: 2.0000 mg | ORAL_TABLET | Freq: Three times a day (TID) | ORAL | Status: DC | PRN

## 2010-12-11 MED ORDER — HYDROCODONE-HOMATROPINE 5-1.5 MG/5ML PO SYRP: 5.0000 mL | ORAL_SOLUTION | Freq: Four times a day (QID) | ORAL | Status: DC | PRN

## 2010-12-11 NOTE — Telephone Encounter (Signed)
Rxs approved.  Would you please call in her diazepam and cough syrup script?  Thanks!

## 2010-12-15 NOTE — Telephone Encounter (Signed)
Called to [employer]

## 2010-12-23 ENCOUNTER — Telehealth: Payer: Self-pay | Admitting: *Deleted

## 2010-12-23 ENCOUNTER — Encounter: Payer: Self-pay | Admitting: Internal Medicine

## 2010-12-23 ENCOUNTER — Encounter: Payer: Medicaid Other | Admitting: Cardiovascular Disease

## 2010-12-23 NOTE — Telephone Encounter (Signed)
Pt calls and states she needs a letter to [employer] for assistance in paying the electric bill, this needs to be done today or the power will be turned off, she would like for the letter to state that due to her home oxygen that it is a matter of life, the power is in her daughter's name, angela whitmore and the letter needs to be addressed to jasmine at the [employer] fax # [phone], i will be glad to fax the letter. i am sending this to donnat. also

## 2010-12-23 NOTE — Telephone Encounter (Signed)
Letter is written in her chart.  Please let me know if you have any problems with it.  Thanks!

## 2010-12-24 NOTE — Telephone Encounter (Signed)
Letter has been faxed.

## 2010-12-26 ENCOUNTER — Encounter: Payer: Medicaid Other | Admitting: Cardiovascular Disease

## 2010-12-29 ENCOUNTER — Encounter: Payer: Self-pay | Admitting: Cardiovascular Disease

## 2011-01-08 ENCOUNTER — Other Ambulatory Visit: Payer: Self-pay | Admitting: *Deleted

## 2011-01-08 NOTE — Telephone Encounter (Signed)
Last refill 1/11 

## 2011-01-09 MED ORDER — HYDROCODONE-HOMATROPINE 5-1.5 MG/5ML PO SYRP: 5.0000 mL | ORAL_SOLUTION | Freq: Four times a day (QID) | ORAL | Status: DC | PRN

## 2011-01-09 NOTE — Telephone Encounter (Signed)
Rx approved.  Would you please call it in to her pharmacy?  Thank you!

## 2011-01-09 NOTE — Telephone Encounter (Signed)
Rx called in 

## 2011-02-05 ENCOUNTER — Encounter: Payer: Self-pay | Admitting: Cardiovascular Disease

## 2011-02-11 ENCOUNTER — Other Ambulatory Visit: Payer: Self-pay | Admitting: *Deleted

## 2011-02-11 MED ORDER — FUROSEMIDE 40 MG PO TABS: 40.0000 mg | ORAL_TABLET | Freq: Every day | ORAL | Status: DC

## 2011-02-11 MED ORDER — ALBUTEROL 90 MCG/ACT IN AERS: 2.0000 | INHALATION_SPRAY | Freq: Four times a day (QID) | RESPIRATORY_TRACT | Status: DC | PRN

## 2011-02-12 ENCOUNTER — Encounter: Payer: Self-pay | Admitting: Internal Medicine

## 2011-02-12 ENCOUNTER — Ambulatory Visit (INDEPENDENT_AMBULATORY_CARE_PROVIDER_SITE_OTHER): Payer: Medicaid Other | Admitting: Internal Medicine

## 2011-02-12 VITALS — BP 124/82 | HR 75 | Temp 97.3°F | Ht 65.0 in | Wt 234.5 lb

## 2011-02-12 DIAGNOSIS — G8929 Other chronic pain: Secondary | ICD-10-CM

## 2011-02-12 DIAGNOSIS — J449 Chronic obstructive pulmonary disease, unspecified: Secondary | ICD-10-CM

## 2011-02-12 DIAGNOSIS — K219 Gastro-esophageal reflux disease without esophagitis: Secondary | ICD-10-CM

## 2011-02-12 DIAGNOSIS — Z0000 Encounter for general adult medical examination without abnormal findings: Secondary | ICD-10-CM

## 2011-02-12 DIAGNOSIS — M1990 Unspecified osteoarthritis, unspecified site: Secondary | ICD-10-CM

## 2011-02-12 DIAGNOSIS — E876 Hypokalemia: Secondary | ICD-10-CM

## 2011-02-12 DIAGNOSIS — F419 Anxiety disorder, unspecified: Secondary | ICD-10-CM

## 2011-02-12 DIAGNOSIS — F411 Generalized anxiety disorder: Secondary | ICD-10-CM

## 2011-02-12 LAB — BASIC METABOLIC PANEL
BUN: 16 mg/dL (ref 6–23)
Chloride: 103 mEq/L (ref 96–112)
Creat: 0.72 mg/dL (ref 0.50–1.10)

## 2011-02-12 MED ORDER — MELOXICAM 7.5 MG PO TABS: 7.5000 mg | ORAL_TABLET | Freq: Every day | ORAL | Status: DC

## 2011-02-12 MED ORDER — DIAZEPAM 2 MG PO TABS: 2.0000 mg | ORAL_TABLET | Freq: Three times a day (TID) | ORAL | Status: DC | PRN

## 2011-02-12 MED ORDER — LORATADINE 10 MG PO TABS
10.0000 mg | ORAL_TABLET | Freq: Every day | ORAL | Status: DC
Start: 2011-02-12 — End: 2011-05-07

## 2011-02-12 MED ORDER — HYDROCODONE-ACETAMINOPHEN 7.5-500 MG PO TABS
1.0000 | ORAL_TABLET | Freq: Four times a day (QID) | ORAL | Status: DC | PRN
Start: 2011-02-12 — End: 2011-02-24

## 2011-02-12 MED ORDER — ALBUTEROL SULFATE HFA 108 (90 BASE) MCG/ACT IN AERS: 2.0000 | INHALATION_SPRAY | Freq: Four times a day (QID) | RESPIRATORY_TRACT | Status: DC | PRN

## 2011-02-12 MED ORDER — ESOMEPRAZOLE MAGNESIUM 20 MG PO CPDR: 20.0000 mg | DELAYED_RELEASE_CAPSULE | Freq: Every day | ORAL | Status: DC

## 2011-02-12 NOTE — Patient Instructions (Signed)
Schedule a followup appointment with Dr. Arvilla Market in March. Please call the clinic if you need to at [phone]. Continue to take all of her medications as directed. Do not take any additional aspirin, Motrin, ibuprofen, Naprosyn or other NSAID medications well you take Mobic. We will help you find an eye doctor. Things will get better!

## 2011-02-12 NOTE — Assessment & Plan Note (Signed)
Her last metabolic panel revealed a K of 5.0.  We'll check a metabolic panel today to assess electrolyte status; she may need to discontinue oral potassium supplementation if her potassium remains at the upper limits of normal.

## 2011-02-12 NOTE — Assessment & Plan Note (Signed)
Patient denies increased cough, increased sputum production, wheezing, worsening shortness of breath.  She does note increased postnasal drip and nasal dryness it is not controlled on certrizine.  Will discontinue this medication and resume loratadine for control of sinus congestion and drainage. Will followup on this at her next office visit.

## 2011-02-12 NOTE — Assessment & Plan Note (Signed)
Patient declines annual flu vaccination.

## 2011-02-12 NOTE — Progress Notes (Signed)
Subjective:    Patient ID: Kelsey Garcia, female    DOB: 1945-08-21, 66 y.o.   MRN: [id number]  HPI: This is a 66 year old female presenting today for routine followup visit with numerous concerns.  1. Pt reports difficulties with vision.  She notes intermittent blurred vision with distant vision.  She notes these symptoms are worse in a darker environment.  She denies any eye pain or drainage.  She denies blindness or black spots in her vision.  2. Pt notes significant nasal dryness with cetrizine.    She is using saline spray that is not making a significant difference.  Application of vaseline/neosporin does also not offer lasting relief.    3. Pt reports neck/back pain that is no longer alleviated by her hydrocodone.  Jonne Ply was offering some relief and pt reports taking 2-3 tabs of ASA every few hours.  She denies any bright red blood per rectum, dark tarry stools, hematemesis, hemoptysis or other abnormal bleeding.   Review of Systems  Constitutional: Negative for fever, chills, diaphoresis, activity change, appetite change, fatigue and unexpected weight change.  HENT: Negative for hearing loss, congestion and neck stiffness.   Eyes: Negative for photophobia, pain and visual disturbance.  Respiratory: Negative for cough, chest tightness, shortness of breath and wheezing.   Cardiovascular: Negative for chest pain and palpitations.  Gastrointestinal: Negative for abdominal pain, blood in stool and anal bleeding.  Genitourinary: Negative for dysuria, hematuria and difficulty urinating.  Musculoskeletal: Negative for joint swelling.  Neurological: Negative for dizziness, syncope, speech difficulty, weakness, numbness and headaches.  Psychiatric/Behavioral: Positive for sleep disturbance and dysphoric mood. Negative for suicidal ideas, hallucinations, behavioral problems, confusion and self-injury. The patient is nervous/anxious. The patient is not hyperactive.        Objective:   Physical  Exam VItal signs reviewed and stable.  Blood pressure slightly elevated above goal. GEN: No apparent distress.  Alert and oriented x 3.  Pleasant although  tearful and very upset at times during the exam, conversant, and cooperative to exam. HEENT: head is autraumatic and normocephalic.  Neck is supple without palpable masses or lymphadenopathy.  No JVD or carotid bruits.  Vision intact.  EOMI.  PERRLA.  Sclerae anicteric.  Conjunctivae without pallor or injection. Mucous membranes are moist.  Oropharynx is without erythema, exudates, or other abnormal lesions.   RESP:  Decreased breath sounds bilaterally but good air movement. .  No wheezes, ronchi, or rubs. CARDIOVASCULAR: regular rate, normal rhythm.  Clear S1, S2, no murmurs, gallops, or rubs. ABDOMEN: soft, non-tender, non-distended.  Bowels sounds present in all quadrants and normoactive.  No palpable masses. EXT: warm and dry.  No clubbing or cyanosis.  No edema in bilateral lower extremities. SKIN: warm and dry with normal turgor.  No rashes or abnormal lesions observed. NEURO: No focal deficit

## 2011-02-12 NOTE — Assessment & Plan Note (Addendum)
The patient has significant and well-established degenerative joint disease affecting multiple joints as evidenced on prior x-rays.  She has been maintained on chronic narcotic medications for management of her pain. She notes that her current hydrocodone doses not alleviating her pain is much over she has noted some relief with aspirin.  Will add Mobic 7.5 mg daily to her pain regimen and increase her hydrocodone to 7.5/500 one tab by mouth every 6 when necessary pain.  A new pain contract was filled out and signed by myself and the patient; he also elected to switch to a new pharmacy and this was also documented on her new medication contract.  After further discussion about her aspirin use, do not believe she has unintentionally overdosed on aspirin; she is asymptomatic and denies any abnormal bleeding. Will check a metabolic panel today.  She has one remaining refill of her Vicodin 5/500 mg tabs; will not submit a refill for the increased dose until she has used her current month supply for

## 2011-02-12 NOTE — Assessment & Plan Note (Addendum)
Patient continues to experience significant stressors at home. She is still attempting to regain custody of her grandchildren. The costs associated with court and lawyer have drained a significant amount of her savings. She is currently living in a hotel.  She denies any suicidal or homicidal ideation at this time and feels she has things "currently under control."  She is continuing to take her Prozac and is tolerating this well.  Additionally she is using Valium but only on an as needed basis.  Will continue with both of these medications for the time being. Cautioned patient to avoid use of alcohol or other sedating medications as she is taking chronic narcotics and is using Valium. Patient acknowledged understanding of this.  Patient also entered into a verbal agreement to contact the clinic and/or suicide prevention hotline should she become more depressed or develop any suicidal/homicidal ideation.

## 2011-02-23 ENCOUNTER — Telehealth: Payer: Self-pay | Admitting: *Deleted

## 2011-02-23 NOTE — Telephone Encounter (Signed)
Call from pt requesting a refill on her pain medication.  Angelina Ok, RN 02/23/2011 2:44 PM.

## 2011-02-24 MED ORDER — HYDROCODONE-ACETAMINOPHEN 7.5-500 MG PO TABS: 1.0000 | ORAL_TABLET | Freq: Four times a day (QID) | ORAL | Status: DC | PRN

## 2011-02-24 NOTE — Telephone Encounter (Signed)
Refill approved.  Would you please call in this rx to her pharmacy?  Thank you!

## 2011-02-25 ENCOUNTER — Telehealth: Payer: Self-pay | Admitting: *Deleted

## 2011-02-25 NOTE — Telephone Encounter (Signed)
Called to pharm 

## 2011-02-25 NOTE — Telephone Encounter (Signed)
Prior authorization request from pharmacy. Nexium  non-preferred, preferred meds include: lansoprazole, omeprazole, pantoprazole, and prilosec,  Unless there is a documented contraindication to using preferred meds, pt must fail 2 PPIs on the preferred list before medicaid will pay for the nexium, please adivse.  If medication is changed please update medication in pt's list and I can notify pt of changes.Kelsey Spittle Cassady1/16/20134:47 PM

## 2011-02-26 NOTE — Telephone Encounter (Signed)
I will review her chart.  I believe she has already tried and failed tx with PPI.

## 2011-03-10 ENCOUNTER — Other Ambulatory Visit: Payer: Self-pay | Admitting: *Deleted

## 2011-03-10 MED ORDER — FUROSEMIDE 40 MG PO TABS: 40.0000 mg | ORAL_TABLET | Freq: Every day | ORAL | Status: DC

## 2011-03-10 NOTE — Telephone Encounter (Signed)
Dr Arvilla Market, [facility] Jupiter wanted you to know the Meloxicam did not work for her and she stopped taking it.

## 2011-03-25 ENCOUNTER — Other Ambulatory Visit: Payer: Self-pay | Admitting: *Deleted

## 2011-03-25 MED ORDER — HYDROCODONE-ACETAMINOPHEN 7.5-500 MG PO TABS: 1.0000 | ORAL_TABLET | Freq: Four times a day (QID) | ORAL | Status: DC | PRN

## 2011-03-25 NOTE — Telephone Encounter (Signed)
Would you please call this in to her pharmacy?  Thank you!

## 2011-03-25 NOTE — Telephone Encounter (Signed)
Hydrocodone 7.5/500mg  rx called to [facility] pharmacy.

## 2011-04-09 ENCOUNTER — Other Ambulatory Visit: Payer: Self-pay | Admitting: *Deleted

## 2011-04-10 MED ORDER — DILTIAZEM HCL ER BEADS 420 MG PO CP24: 420.0000 mg | ORAL_CAPSULE | Freq: Every day | ORAL | Status: DC

## 2011-04-15 ENCOUNTER — Other Ambulatory Visit: Payer: Self-pay | Admitting: *Deleted

## 2011-04-15 MED ORDER — HYDROCODONE-HOMATROPINE 5-1.5 MG/5ML PO SYRP: 5.0000 mL | ORAL_SOLUTION | Freq: Four times a day (QID) | ORAL | Status: DC | PRN

## 2011-04-20 ENCOUNTER — Ambulatory Visit (INDEPENDENT_AMBULATORY_CARE_PROVIDER_SITE_OTHER): Payer: Medicaid Other | Admitting: Internal Medicine

## 2011-04-20 ENCOUNTER — Encounter: Payer: Self-pay | Admitting: Internal Medicine

## 2011-04-20 VITALS — BP 121/71 | HR 80 | Temp 97.6°F | Ht 65.0 in | Wt 236.9 lb

## 2011-04-20 DIAGNOSIS — Z598 Other problems related to housing and economic circumstances: Secondary | ICD-10-CM

## 2011-04-20 DIAGNOSIS — L989 Disorder of the skin and subcutaneous tissue, unspecified: Secondary | ICD-10-CM

## 2011-04-20 DIAGNOSIS — F329 Major depressive disorder, single episode, unspecified: Secondary | ICD-10-CM

## 2011-04-20 DIAGNOSIS — M1990 Unspecified osteoarthritis, unspecified site: Secondary | ICD-10-CM

## 2011-04-20 DIAGNOSIS — J449 Chronic obstructive pulmonary disease, unspecified: Secondary | ICD-10-CM

## 2011-04-20 MED ORDER — RANITIDINE HCL 150 MG PO TABS: 150.0000 mg | ORAL_TABLET | Freq: Two times a day (BID) | ORAL | Status: DC

## 2011-04-20 NOTE — Assessment & Plan Note (Signed)
No change in pts ability to ambulate or perform ADLs.  She states she did not notice any significant benefit with Mobic for adjunctive pain management and anti-inflammatory properties.  Will remove Mobic from her medication list and continue with her current dosing of Vicodin per pain contract.  Will

## 2011-04-20 NOTE — Patient Instructions (Signed)
Schedule followup appointment with Dr. Arvilla Market in May, sooner if needed Your  medications were called into your pharmacy. We will set you up with a social worker for help getting your medicines. We will get you in to see a dermatologist for further evaluation of a lesion on your back

## 2011-04-20 NOTE — Progress Notes (Signed)
Patient ID: Kelsey Garcia, female   DOB: 10-29-1945, 66 y.o.   MRN: [id number]  Subjective:   HPI: This is a 66 year old female presenting today for routine followup visit.  1. Pt states she is completely out of money and is unable to afford her medications, particularly her ventolin and dilatiazem.  She is currently living in a hotel and is still experiencing considerable stress over the ongoing battle to regain custody of her grandchildren.  2. She reports a lesion on her back.  She first noticed an itchy spot on her back that bled with scratching.  It has been present for >27mo.  She states it is not painful, is occasionally itchy, and has grown in size.    3. She reports a change in her wheezefor the past few weeks.  She admits to mild increased cough but no increase in sputum production.  She denies fever, chills, hemoptysis, chest pain, syncope, nausea, vomiting, or diarrhea  Review of Systems  Constitutional: Negative for fever, chills, diaphoresis, activity change, appetite change, fatigue and unexpected weight change.  HENT: Negative for hearing loss, congestion and neck stiffness.   Eyes: Negative for photophobia, pain and visual disturbance.  Respiratory: Negative for chest tightness, shortness of breath, and hemoptysis  Cardiovascular: Negative for chest pain and palpitations.  Gastrointestinal: Negative for abdominal pain, blood in stool and anal bleeding.  Genitourinary: Negative for dysuria, hematuria and difficulty urinating.  Musculoskeletal: Negative for joint swelling.  Neurological: Negative for dizziness, syncope, speech difficulty, weakness, numbness and headaches.  Psychiatric/Behavioral: Positive for sleep disturbance and dysphoric mood. Negative for suicidal ideas, hallucinations, behavioral problems, confusion and self-injury. The patient is nervous/anxious. The patient is not hyperactive.        Objective:   Physical Exam GEN: No apparent distress.  Alert and  oriented x 3.  Pleasant although  tearful and very upset at times during the exam, conversant, and cooperative to exam. HEENT: head is autraumatic and normocephalic.  Neck is supple without palpable masses or lymphadenopathy.  No JVD or carotid bruits.  Vision intact.  EOMI.  PERRLA.  Sclerae anicteric.  Conjunctivae without pallor or injection. Mucous membranes are moist.  Oropharynx is without erythema, exudates, or other abnormal lesions.   RESP:  Decreased breath sounds bilaterally but good air movement. .  No wheezes, ronchi, or rubs. CARDIOVASCULAR: regular rate, normal rhythm.  Clear S1, S2, no murmurs, gallops, or rubs. ABDOMEN: soft, non-tender, non-distended.  Bowels sounds present in all quadrants and normoactive.  No palpable masses. EXT: warm and dry.  No clubbing or cyanosis.  No edema in bilateral lower extremities. SKIN: warm and dry with normal turgor.  No rashes present.  Small, raised, scaling, slightly erythematous lesion approx 1cm x 1cm in size is present on patient's back.  It is not easily friable or TTP.  No ulceration, fluctuance, or drainage. NEURO: No focal deficit

## 2011-04-20 NOTE — Assessment & Plan Note (Signed)
Pt has a skin lesion on her back that seems consistent with possible squamous cell carcinoma or possible tinea; tinea seems less likely given appearance, (lack of erythema/central clearing)

## 2011-04-21 NOTE — Progress Notes (Signed)
Addended by: Neomia Dear on: 04/21/2011 01:17 PM   Modules accepted: Orders

## 2011-04-22 DIAGNOSIS — Z598 Other problems related to housing and economic circumstances: Secondary | ICD-10-CM | POA: Insufficient documentation

## 2011-04-22 NOTE — Assessment & Plan Note (Addendum)
This is stable.  She reports a change in her wheezing however she is not wheezing on exam today.  Given her report of increased dry cough, advised her to call the clinic if she develops increased sputum production, worsening SOB, or increasing wheezing as these symptoms would represent acute COPD exacerbation and need to treat with steroids and antibiotics.  Pt expresses understanding and agreement to plan

## 2011-04-22 NOTE — Assessment & Plan Note (Signed)
SHe continues to experience significant social stress but feels she is doing ok.  She denies suicidal ideation and homicidal ideation.  Denies adverse effects from prozac.  Will continue her current regimen.  Will refer to SW for financial assistance and also referral for mental health resources.

## 2011-04-22 NOTE — Assessment & Plan Note (Signed)
Pt states she is unable to most of her medications 2/2 increased financial strain but states she will be able to obtain money to purchase her pain medications, cough syrup, and diltiazem later this week. Will give her sample of ventolin today.  She is currently living in a hotel with her daughter.  Will refer her to social work for assistance with housing and medication procurement.

## 2011-04-27 ENCOUNTER — Telehealth: Payer: Self-pay

## 2011-04-27 NOTE — Telephone Encounter (Signed)
Pt was referred to CSW for financial problems.  CSW placed call, pt states she is currently living in a motel and after paying for Rx and motel fees she has limited funds.  Pt states she has no transportation.  Pt states she is eligible for public housing but does not have transportation to apply.  Pt in agreement for CSW to make referral to [employer] and other agencies.  Pt states "put me on all the lists".  CSW will inquire with [employer] and housing options as well.  CSW will call pt back and notify of which agencies pt has been referred.

## 2011-04-29 NOTE — Telephone Encounter (Signed)
Pt is insured an unable to complete referral to [facility].  Awaiting return call from [employer] regarding pt's eligibility for insured services with [employer].  CSW left message with pt's Medicaid caseworker Ms. Hyacinth Meeker to determine available resources.  CSW informed pt of above.  CSW will mail pt transportation information as well as information from [employer].

## 2011-04-30 NOTE — Telephone Encounter (Signed)
Faxed referral to Care Mgmt at [facility].

## 2011-05-07 ENCOUNTER — Other Ambulatory Visit: Payer: Self-pay | Admitting: *Deleted

## 2011-05-07 DIAGNOSIS — F324 Major depressive disorder, single episode, in partial remission: Secondary | ICD-10-CM

## 2011-05-07 DIAGNOSIS — J449 Chronic obstructive pulmonary disease, unspecified: Secondary | ICD-10-CM

## 2011-05-07 MED ORDER — FLUOXETINE HCL 20 MG PO CAPS: ORAL_CAPSULE | ORAL | Status: DC

## 2011-05-07 MED ORDER — LORATADINE 10 MG PO TABS: 10.0000 mg | ORAL_TABLET | Freq: Every day | ORAL | Status: DC

## 2011-05-14 ENCOUNTER — Telehealth: Payer: Self-pay | Admitting: *Deleted

## 2011-05-14 NOTE — Telephone Encounter (Signed)
Pt calls and states daughter has virus x 4 days, wants something called in for self for nausea. She is instructed to take plenty of fluids, rest and to call if unable to take and keep fluids down or go to urg care. She suggested phenergan. Informed she would need to be seen. States she is able to eat and drink just feels nauseous.

## 2011-05-18 NOTE — Telephone Encounter (Signed)
I agree with evaluation.  If she does not want to come in, she can always try OTC meclizine or Nauzene liquid.

## 2011-06-09 ENCOUNTER — Telehealth: Payer: Self-pay | Admitting: *Deleted

## 2011-06-09 NOTE — Telephone Encounter (Signed)
Pt called with question about her inhaler.  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 I tried to return call @ # given [phone] rm [phone] with no answer. Pt has scheduled appointment tomorrow.  I will try again

## 2011-06-09 NOTE — Telephone Encounter (Signed)
Talked with pt and she will bring both inhalers in for appointment tomorrow.

## 2011-06-10 ENCOUNTER — Encounter: Payer: Self-pay | Admitting: Internal Medicine

## 2011-06-10 ENCOUNTER — Ambulatory Visit (INDEPENDENT_AMBULATORY_CARE_PROVIDER_SITE_OTHER): Payer: Medicaid Other | Admitting: Internal Medicine

## 2011-06-10 VITALS — BP 134/72 | HR 86 | Temp 98.0°F | Resp 20 | Ht 65.0 in | Wt 232.2 lb

## 2011-06-10 DIAGNOSIS — F419 Anxiety disorder, unspecified: Secondary | ICD-10-CM

## 2011-06-10 DIAGNOSIS — I10 Essential (primary) hypertension: Secondary | ICD-10-CM

## 2011-06-10 DIAGNOSIS — F329 Major depressive disorder, single episode, unspecified: Secondary | ICD-10-CM

## 2011-06-10 DIAGNOSIS — F332 Major depressive disorder, recurrent severe without psychotic features: Secondary | ICD-10-CM

## 2011-06-10 DIAGNOSIS — M1990 Unspecified osteoarthritis, unspecified site: Secondary | ICD-10-CM

## 2011-06-10 DIAGNOSIS — J449 Chronic obstructive pulmonary disease, unspecified: Secondary | ICD-10-CM

## 2011-06-10 MED ORDER — HYDROCODONE-ACETAMINOPHEN 7.5-500 MG PO TABS: 1.0000 | ORAL_TABLET | Freq: Four times a day (QID) | ORAL | Status: DC | PRN

## 2011-06-10 MED ORDER — VENTOLIN HFA 108 (90 BASE) MCG/ACT IN AERS: 2.0000 | INHALATION_SPRAY | Freq: Four times a day (QID) | RESPIRATORY_TRACT | Status: DC | PRN

## 2011-06-10 MED ORDER — ZOLPIDEM TARTRATE 5 MG PO TABS: 5.0000 mg | ORAL_TABLET | Freq: Every evening | ORAL | Status: DC | PRN

## 2011-06-10 MED ORDER — ALPRAZOLAM 0.5 MG PO TABS: 0.5000 mg | ORAL_TABLET | Freq: Three times a day (TID) | ORAL | Status: DC | PRN

## 2011-06-10 NOTE — Patient Instructions (Signed)
We will refer you to a psychiatrist to help with your medication regimen. Scheduled followup appointment with Dr. Arvilla Market in the next 1-2 months or sooner as needed. Keep taking all of her medicines as directed. Ambien as a new medicine for sleep. Contact the clinic if this is not working or causes any concerning side effects.

## 2011-06-10 NOTE — Progress Notes (Signed)
Patient ID: CHARLOTTA LAPAGLIA, female   DOB: 08/14/1945, 66 y.o.   MRN: [id number]  Subjective:   HPI: This is a 66 year old female presenting today for routine followup visit.   Notes that proair HFA results in worsening sob and wheeze.  She is aware that this is the same medication of ventolin but notes the ventolin improved her wheeze amd has done well for her in the past and the alternative proair provided by her insurance is not helping improve her breathing, swheezing, or sob.  She denies any increase of her chronic cough, increase in sputum production, increasing wheezing, hemoptysis, fever or chills.    Her her daughter is with her today and expresses in today concerned over further deterioration of her mothers mood. Patient has become increasingly depressed and anxious regarding her current poor financial situation and ongoing custody battle regarding her graduate in [place]. Patient states she is tearful and depressed most of the time and has difficulty getting up out of bed to complete her normal activities of daily living. She admits to diminished pleasure in a previously normal [place]. She currently denies suicidal or homicidal ideation.    Review of Systems  Constitutional: Negative for fever, chills, diaphoresis, activity change, appetite change, fatigue and unexpected weight change.  HENT: Negative for hearing loss, congestion and neck stiffness.   Eyes: Negative for photophobia, pain and visual disturbance.  Respiratory: Negative for chest tightness, shortness of breath, and hemoptysis  Cardiovascular: Negative for chest pain and palpitations.  Gastrointestinal: Negative for abdominal pain, blood in stool and anal bleeding.  Genitourinary: Negative for dysuria, hematuria and difficulty urinating.  Musculoskeletal: Negative for joint swelling.  Neurological: Negative for dizziness, syncope, speech difficulty, weakness, numbness and headaches.  Psychiatric/Behavioral: Positive for  sleep disturbance and dysphoric mood. Negative for suicidal ideas, hallucinations, behavioral problems, confusion and self-injury. The patient is nervous/anxious. The patient is not hyperactive.        Objective:   Physical Exam GEN: No apparent distress.  Alert and oriented x 3.  Patient very tearful and upset frequently during the exam but conversant, and cooperative to exam. HEENT: head is autraumatic and normocephalic.  Neck is supple without palpable masses or lymphadenopathy.  No JVD or carotid bruits.  Vision intact.  EOMI.  PERRLA.  Sclerae anicteric.  Conjunctivae without pallor or injection. Mucous membranes are moist.  Oropharynx is without erythema, exudates, or other abnormal lesions.   RESP:  Decreased breath sounds bilaterally but good air movement. .  No wheezes, ronchi, or rubs. CARDIOVASCULAR: regular rate, normal rhythm.  Clear S1, S2, no murmurs, gallops, or rubs. ABDOMEN: soft, non-tender, non-distended.  Bowels sounds present in all quadrants and normoactive.  No palpable masses. EXT: warm and dry.  No clubbing or cyanosis.  No edema in bilateral lower extremities. SKIN: warm and dry with normal turgor.  No rashes present.

## 2011-06-11 ENCOUNTER — Other Ambulatory Visit: Payer: Self-pay | Admitting: *Deleted

## 2011-06-11 ENCOUNTER — Telehealth: Payer: Self-pay | Admitting: Licensed Clinical Social Worker

## 2011-06-11 ENCOUNTER — Encounter: Payer: Self-pay | Admitting: *Deleted

## 2011-06-11 MED ORDER — BUDESONIDE-FORMOTEROL FUMARATE 160-4.5 MCG/ACT IN AERO: 2.0000 | INHALATION_SPRAY | Freq: Two times a day (BID) | RESPIRATORY_TRACT | Status: DC

## 2011-06-11 NOTE — Assessment & Plan Note (Signed)
WIll refill her chronic narcotic meds today.

## 2011-06-11 NOTE — Telephone Encounter (Signed)
Due to no answer at hotel front desk, CSW faxed front desk requesting them to have room 225 Ms. Mohs, call CSW

## 2011-06-11 NOTE — Telephone Encounter (Signed)
Pt referred to CSW for shower chair.  Shower chair are a private pay item.  [facility] can deliver to pt.  CSW will need to confirm if pt wants to private pay for shower chair.  Attempting to contact pt, no answer at the hotel front desk.  Called pt's dau, Dau states pt is at hotel. CSW called [facility] to inquire about referral CSW faxed, pt has yet to be assigned to care management.  Spoke with Quita Skye and was instructed to refax referral and pt will be reassigned.  CSW will continue to contact pt.

## 2011-06-11 NOTE — Assessment & Plan Note (Signed)
This appears stable, there are not signs/symptoms of acute COPD exacerbation.  Will attempt to complete prior authorization for ventolin given report of poor response to pro-air.

## 2011-06-11 NOTE — Assessment & Plan Note (Signed)
Blood pressure within acceptable limits today. We'll not make any changes to her current regimen.

## 2011-06-11 NOTE — Progress Notes (Signed)
Fax from [address] [phone] for PA request on Ventolin.  Pt has tried proair in the past without relief.  PA approved until 06/10/2012. Pharmacy aware attempted to contact pt, no answer, unable to leave message.  Pharmacy did ask that pt contact them prior to picking up rx as they are having a problem with PA requests going through in a timely fashion.Kelsey Spittle Cassady5/2/20134:37 PM

## 2011-06-11 NOTE — Assessment & Plan Note (Signed)
Pt with worsening and now severe depression.  She is currently not suicidal or homicidal.  She history on 60 mg of Prozac daily in addition to benzodiazepines 3 times a day as needed for increased anxiety and panic attack.  Given her history of adverse side effects too number of psychiatric medications in the past (ativan, seroquel) and increasing severity of her depression and anxiety were for her to psychiatry today for additional evaluation and management of poor mood disorder.  She may benefit from addition of an antipsychotic.

## 2011-06-12 NOTE — Telephone Encounter (Signed)
CSW received report from [place] S at [facility], assigned SW for [name].  [facility] has been in contact with pt and had signed off on case as pt indicated to [facility] that she did not need care management.  Pt informed [facility] of her main need at the moment to find more affordable housing.  Resources were sent to pt.  Pt will need to contact Tonji S to have case reopened for care management. Received voicemail from pt returning call. CSW returned call to pt and no answer at [phone], number rings and then busy signal.

## 2011-06-15 NOTE — Telephone Encounter (Signed)
CSW returned call from pt.  Obtained alternate number [phone], which one can just enter room number.  Provided this number to the pt to provide to people to assist with her care needs.  CSW informed pt the private pay cost of shower chair and it could be delivered, cost $30 pt declines at this time due to lack of funds.  CSW encouraged pt to contact Tonji S at [facility] to initiate care mgmt as Wandra Arthurs may be able to assist with getting a shower chair as well as transportation to [employer].  Pt states she is linked with [employer], Medicaid Transportation, however one of her medications is not covered by medicaid, "Hydromet".  CSW will check to see if manufacturer has discount program.  CSW provided pt with number and information to both [facility] and [facility] for psychiatry referral.

## 2011-06-16 ENCOUNTER — Telehealth: Payer: Self-pay | Admitting: Licensed Clinical Social Worker

## 2011-06-16 NOTE — Telephone Encounter (Signed)
CSW was able to find online programs and discount card for [employer] at local pharmacy through [place] and [email or web address]  CSW mailed information to pt.

## 2011-06-18 ENCOUNTER — Telehealth: Payer: Self-pay | Admitting: Cardiovascular Disease

## 2011-06-18 NOTE — Telephone Encounter (Signed)
New Problem:     I called the patient to attempt to schedule an appointment and was informed that she felt like it was a complete waste of time and did not wish to reschedule at this time.

## 2011-06-22 ENCOUNTER — Emergency Department (HOSPITAL_COMMUNITY)
Admission: EM | Admit: 2011-06-22 | Discharge: 2011-06-22 | Disposition: A | Discharge status: Home / Self Care | Payer: Medicaid Other | Attending: Emergency Medicine | Admitting: Emergency Medicine

## 2011-06-22 ENCOUNTER — Emergency Department (HOSPITAL_COMMUNITY): Payer: Medicaid Other

## 2011-06-22 ENCOUNTER — Encounter (HOSPITAL_COMMUNITY): Payer: Self-pay | Admitting: Emergency Medicine

## 2011-06-22 DIAGNOSIS — R531 Weakness: Secondary | ICD-10-CM

## 2011-06-22 DIAGNOSIS — R5381 Other malaise: Secondary | ICD-10-CM | POA: Insufficient documentation

## 2011-06-22 DIAGNOSIS — I4891 Unspecified atrial fibrillation: Secondary | ICD-10-CM | POA: Insufficient documentation

## 2011-06-22 DIAGNOSIS — E669 Obesity, unspecified: Secondary | ICD-10-CM | POA: Insufficient documentation

## 2011-06-22 DIAGNOSIS — G8929 Other chronic pain: Secondary | ICD-10-CM | POA: Insufficient documentation

## 2011-06-22 DIAGNOSIS — I10 Essential (primary) hypertension: Secondary | ICD-10-CM | POA: Insufficient documentation

## 2011-06-22 DIAGNOSIS — Z79899 Other long term (current) drug therapy: Secondary | ICD-10-CM | POA: Insufficient documentation

## 2011-06-22 LAB — CBC
HCT: 39.3 % (ref 36.0–46.0)
Hemoglobin: 12.6 g/dL (ref 12.0–15.0)
MCH: 32.5 pg (ref 26.0–34.0)
MCHC: 32.1 g/dL (ref 30.0–36.0)

## 2011-06-22 LAB — BASIC METABOLIC PANEL
BUN: 15 mg/dL (ref 6–23)
GFR calc non Af Amer: 87 mL/min — ABNORMAL LOW (ref >90)
Glucose, Bld: 99 mg/dL (ref 70–99)
Potassium: 4 mEq/L (ref 3.5–5.1)

## 2011-06-22 NOTE — ED Notes (Signed)
PA at bedside.

## 2011-06-22 NOTE — ED Notes (Signed)
PA at bedside with nurse tech to assist patient with ambulation; patient states that she has "end-stage arthritis" and she has been talking with her primary care physician about getting a walker/cane to help with walking.

## 2011-06-22 NOTE — ED Provider Notes (Signed)
Medical screening examination/treatment/procedure(s) were conducted as a shared visit with non-physician practitioner(s) and myself.  I personally evaluated the patient during the encounter  Concern for central vertigo given difficulty ambulating. No low back pain. Chronic arthritis. MRI without stroke. i think her symptoms may be more related to deconditioning and hip and knee arthritis. No indication for imaging of C, T or L spine   Lyanne Co, MD 06/22/11 1944

## 2011-06-22 NOTE — ED Notes (Signed)
Patient able to bear weight on both legs and walk several steps; patient still complaining of a "weakness feeling" in right leg.

## 2011-06-22 NOTE — Discharge Instructions (Signed)
As we discussed, your MRI today did not show any cause for your symptoms. Your EKG and labs showed no new or concerning findings. You have a follow-up appointment scheduled with your regular doctor for this Friday. If you develop changes in your symptoms before that time, please call them and try to get a sooner appointment or return to the ER for re-evaluation. A walker has been ordered for you to help you get around at home. Please talk with your doctor about any other assistive devices you feel you may need.      Weakness Weakness can be caused by many things. Your doctor has checked for the most common causes. HOME CARE  Rest.   Eat a well-balanced diet.   Exercise every day.   Go to follow-up appointments.  GET HELP RIGHT AWAY IF:   There is chest pain or chest pressure.   You have problems breathing.   You cannot do usual daily activities.   You have problems speaking or swallowing.   You have a very bad headache or belly (abdominal) pain.   The heartbeat does not feel normal or the pulse is very fast.   You are confused.   You have vision problems or problems walking.   You have chills.   You or your child has a temperature by mouth above 102 F (38.9 C), not controlled by medicine.   Your baby is older than 3 months with a rectal temperature of 102 F (38.9 C) or higher.   Your baby is 51 months old or younger with a rectal temperature of 100.4 F (38 C) or higher.  MAKE SURE YOU:   Understand these instructions.   Will watch this condition.   Will get help right away if you are not doing well or get worse.  Document Released: 01/09/2008 Document Revised: 01/15/2011 Document Reviewed: 01/09/2008 [facility] Patient Information 2012 [place], [place].

## 2011-06-22 NOTE — ED Notes (Signed)
Spoke with case manager, walker ordered for home use. Awaiting [facility] to deliver it to pt.  Pt waiting for daughter to arrive with portable oxygen tank.

## 2011-06-22 NOTE — ED Notes (Signed)
Patient transported to MRI 

## 2011-06-22 NOTE — ED Provider Notes (Signed)
History     CSN: [id number]  Arrival date & time 06/22/11  0609   First MD Initiated Contact with Patient 06/22/11 [phone]      Chief Complaint  Patient presents with  . Extremity Weakness    (Consider location/radiation/quality/duration/timing/severity/associated sxs/prior treatment) The history is provided by the patient.  66 y/o F with PMH HTN, rate-controlled a-fib, chronic pain on narcotics, and depression presents to ED with c/c of right leg weakness that began approx 1 hour PTA. While walking to the bathroom, she began to feel that her right leg "wouldn't hold my weight" and lowered herself to the ground without injury. She was able to move the leg and denies that there was any numbness or tingling. Attempted multiple times to get up and each time had to lower herself back to the ground. She denies any similar prior episodes. Denies any recent injury or back pain. Denies HA, difficulty speaking. No known aggravating or alleviating factors. No prior treatment.  Past Medical History  Diagnosis Date  . Atrial fibrillation   . Depression   . Hypertension   . COPD (chronic obstructive pulmonary disease)   . Insomnia   . OCD (obsessive compulsive disorder)   . Nonischemic cardiomyopathy     Past Surgical History  Procedure Date  . Tubal ligation     Family History  Problem Relation Age of Onset  . Asthma    . Emphysema    . Allergies    . Cancer      aunt had several types of cancer    History  Substance Use Topics  . Smoking status: Current Everyday Smoker -- 0.1 packs/day    Types: Cigarettes  . Smokeless tobacco: Not on file   Comment: states she smokes now and then.Sometimes none sometimes one a day for nerves  . Alcohol Use: Yes     very rare, special ocassions     Review of Systems 10 systems reviewed and are negative for acute change except as noted in the HPI.  Allergies  Diphenhydramine hcl; Erythromycin; Nyquil; Pseudoephedrine; Tramadol hcl; and  Tylenol  Home Medications   Current Outpatient Rx  Name Route Sig Dispense Refill  . ALPRAZOLAM 0.5 MG PO TABS Oral Take 1 tablet (0.5 mg total) by mouth 3 (three) times daily as needed for sleep or anxiety. 90 tablet 2  . BUDESONIDE-FORMOTEROL FUMARATE 160-4.5 MCG/ACT IN AERO Inhalation Inhale 2 puffs into the lungs 2 (two) times daily. 1 Inhaler 11  . DILTIAZEM HCL ER BEADS 420 MG PO CP24 Oral Take 1 capsule (420 mg total) by mouth daily. 30 capsule 6  . ESOMEPRAZOLE MAGNESIUM 20 MG PO CPDR Oral Take 1 capsule (20 mg total) by mouth daily before breakfast. 30 capsule 11  . FLUOXETINE HCL 20 MG PO CAPS  Take 3 caps (60 mg) po a day 90 capsule 11  . FUROSEMIDE 40 MG PO TABS Oral Take 1 tablet (40 mg total) by mouth daily with breakfast. 30 tablet 1  . HYDROCODONE-ACETAMINOPHEN 7.5-500 MG PO TABS Oral Take 1 tablet by mouth every 6 (six) hours as needed for pain. 60 tablet 3  . HYDROCODONE-HOMATROPINE 5-1.5 MG/5ML PO SYRP Oral Take 5 mLs by mouth every 6 (six) hours as needed for cough. 120 mL 0  . HYDROCODONE-HOMATROPINE 5-1.5 MG/5ML PO SYRP Oral Take 5 mLs by mouth every 6 (six) hours as needed for cough. 150 mL 0  . HYDROCODONE-HOMATROPINE 5-1.5 MG/5ML PO SYRP Oral Take 5 mLs by mouth every 6 (  six) hours as needed for cough. 120 mL 3  . HYDROCODONE-HOMATROPINE 5-1.5 MG/5ML PO SYRP Oral Take 5 mLs by mouth every 6 (six) hours as needed for cough. 240 mL 0  . HYDROCODONE-HOMATROPINE 5-1.5 MG/5ML PO SYRP Oral Take 5 mLs by mouth every 6 (six) hours as needed for cough. 120 mL 0  . LORATADINE 10 MG PO TABS Oral Take 1 tablet (10 mg total) by mouth daily. 30 tablet 11  . MONTELUKAST SODIUM 10 MG PO TABS Oral Take 1 tablet (10 mg total) by mouth daily. 30 tablet 2  . POTASSIUM CHLORIDE 20 MEQ PO PACK Oral Take 20 mEq by mouth daily. 30 tablet 6  . RANITIDINE HCL 150 MG PO TABS Oral Take 1 tablet (150 mg total) by mouth 2 (two) times daily. 60 tablet 1  . TIOTROPIUM BROMIDE MONOHYDRATE 18 MCG IN  CAPS Inhalation Place 1 capsule (18 mcg total) into inhaler and inhale daily. 30 capsule 11  . VENTOLIN HFA 108 (90 BASE) MCG/ACT IN AERS Inhalation Inhale 2 puffs into the lungs every 6 (six) hours as needed for wheezing. 18 g 12    Dispense as written.  Marland Kitchen ZOLPIDEM TARTRATE 5 MG PO TABS Oral Take 1 tablet (5 mg total) by mouth at bedtime as needed for sleep. 30 tablet 1    BP 149/74  Pulse 90  Temp(Src) 97.6 F (36.4 C) (Oral)  Resp 21  SpO2 99%  Physical Exam  Nursing note and vitals reviewed. Constitutional: She is oriented to person, place, and time. She appears well-developed and well-nourished. No distress.  HENT:  Head: Normocephalic and atraumatic.  Right Ear: External ear normal.  Left Ear: External ear normal.  Mouth/Throat: Oropharynx is clear and moist.  Eyes: Conjunctivae are normal. Pupils are equal, round, and reactive to light.  Neck: Neck supple.  Cardiovascular:  No murmur heard.      Irregularly irregular rhythm, rate WNL Bilateral radial and DP pulses are 2+   Pulmonary/Chest: Effort normal. No respiratory distress.       Decreased air movement in all lung fields without wheezing or crackles.  Abdominal:       Soft, obese, nml bowel sounds. Non-tender.  Musculoskeletal: Normal range of motion. She exhibits no edema and no tenderness.       Calves supple and non-tender  Neurological: She is alert and oriented to person, place, and time. Cranial nerve deficit: CN 3-12 tested and are intact. GCS eye subscore is 4. GCS verbal subscore is 5. GCS motor subscore is 6.  Reflex Scores:      Patellar reflexes are 2+ on the right side and 2+ on the left side.      Sensation intact to light touch and symmetric in BLE. Strength as tested in major muscle groups of BLE is 5/5 and symmetric. Gait slow, without ataxia, requires some assistance.  Skin: Skin is warm and dry. No rash noted.  Psychiatric: Her speech is normal. She exhibits a depressed mood. She expresses no  homicidal and no suicidal ideation. She expresses no suicidal plans.    ED Course  Procedures (including critical care time)  Labs Reviewed  CBC - Abnormal; Notable for the following:    MCV 101.3 (*)    All other components within normal limits  BASIC METABOLIC PANEL - Abnormal; Notable for the following:    GFR calc non Af Amer 87 (*)    All other components within normal limits  TROPONIN I   Mr Brain Wo  Contrast  06/22/2011  *RADIOLOGY REPORT*  Clinical Data: Extremity weakness, off balance.  History of hypertension.  History of cardiomyopathy with atrial fibrillation.  MRI HEAD WITHOUT CONTRAST  Technique:  Multiplanar, multiecho pulse sequences of the brain and surrounding structures were obtained according to standard protocol without intravenous contrast.  Comparison: None.  Findings: The patient had difficulty remaining motionless for the study.  Images are suboptimal.  Small or subtle lesions could be overlooked.  There is no evidence for acute infarction, intracranial hemorrhage, mass lesion, hydrocephalus, or extra-axial fluid.  Mild premature atrophy is present. Chronic microvascular ischemic change is noted with increased signal in the periventricular and subcortical white matter, including the brainstem.  The major intracranial vascular structures appear patent.  Grossly negative pituitary and cerebellar tonsils.  No acute sinus, mastoid, or orbital findings.  IMPRESSION: Premature atrophy.  Mild chronic microvascular ischemic change.  No visible acute stroke or bleed.  No intracranial mass lesion is observed which might result in extremity weakness.  Original Report Authenticated By: Elsie Stain, M.D.    Date: 06/22/2011  Rate: 82  Rhythm: atrial fibrillation  QRS Axis: normal  Intervals: normal  ST/T Wave abnormalities: normal  Conduction Disutrbances:none  Narrative Interpretation: prior EKG dated 09/25/2010, a-fib present at that time. EKG reading of abnormal r-wave  progression seen on prior tracing.  Old EKG Reviewed: unchanged    Dx 1: Weakness   MDM  66 y/o pt with multiple medical and psychiatric problems, isolated r leg weakness today (though full active ROM). Pt ambulatory in ED with no specific neuro or motor findings on initial examination, but on further ambulation with EDP, she describes feeling as "off-balance". MRI of the brain ordered to eval for small stroke as cause of symptoms given risk factor of a-fib, cardiomyopathy. Not a TIA as symptoms continue. Symptoms are improved from onset, pt not a good tpa candidate if this is found to be an acute stroke. Basic labs, EKG ordered.   MRI reviewed, no finding to suggest cause of today's complaint. EKG unchanged from prior. Labs unremarkable. Discussed pt care with resident for IMTS, they will see pt in office on Friday. Also discussed with them giving pt a walker for home use as her symptoms may be related to her other medical issues (arthritis, general poor physical state); this has been ordered from the ED. I discussed the plan with the patient, who voices understanding and will be d/c home.        Shaaron Adler, [place] 06/22/11 1035

## 2011-06-22 NOTE — ED Notes (Signed)
[facility] delivered walker

## 2011-06-22 NOTE — ED Notes (Signed)
Informed patient and/or family of status. No voiced complaints presently. NAD.  

## 2011-06-22 NOTE — ED Notes (Signed)
Report received, assumed care.  

## 2011-06-22 NOTE — Progress Notes (Signed)
Received call from ED RN re rolling walker for home use. States that pt does not have walker at home, ht 5\' 5"  and wt 230. [facility] notified and will deliver walker to ED for pt use.  Starlyn Skeans RN MPH Case Manager [phone]

## 2011-06-22 NOTE — ED Notes (Signed)
Old and new EKG given to MD, copies placed in chart. 

## 2011-06-22 NOTE — ED Notes (Addendum)
Per EMS, patient got up to go to the bathroom this morning when she felt weak in her right leg and eased herself on the floor.  Denies fall; denies injury.  Patient was unable to get up from a sitting position, so she called her daughter (whom she lives with).  No slurred speech; stroke scale negative.  No numbness/tingling in extremity.

## 2011-06-26 ENCOUNTER — Encounter: Payer: Self-pay | Admitting: Internal Medicine

## 2011-06-26 ENCOUNTER — Ambulatory Visit (INDEPENDENT_AMBULATORY_CARE_PROVIDER_SITE_OTHER): Payer: Medicaid Other | Admitting: Internal Medicine

## 2011-06-26 VITALS — BP 109/52 | HR 54 | Temp 97.9°F | Resp 20 | Ht 64.0 in | Wt 242.3 lb

## 2011-06-26 DIAGNOSIS — F329 Major depressive disorder, single episode, unspecified: Secondary | ICD-10-CM

## 2011-06-26 DIAGNOSIS — Z0000 Encounter for general adult medical examination without abnormal findings: Secondary | ICD-10-CM

## 2011-06-26 DIAGNOSIS — M1990 Unspecified osteoarthritis, unspecified site: Secondary | ICD-10-CM

## 2011-06-26 NOTE — Assessment & Plan Note (Signed)
Patient's weakness/difficulty ambulating has appeared to resolve. I am unsure if her symptoms represented TIA, are result of her increased anxiety, or simply related to her underlying degenerative joint disease. Her neurological exam is normal today. She reports some pain that persists in her right hip after her fall. She does not believe she requires any change to her pain regimen for management of this. She states she is not interested in formal referral to PT as she has been told in the past that she would be may benefit from water aerobics. I will contact social work to see what options are available for her to attend water aerobics classes for no charge or for minimal charge. Patient states she is also interested in obtaining a motorized wheelchair. I believe this is reasonable given her underlying arthritis and severe COPD.  Brief review of the EMR reveals she has spoken with social work about this. I will followup with Kelsey Garcia regarding the status of this request.

## 2011-06-26 NOTE — Progress Notes (Signed)
Patient ID: Kelsey Garcia, female   DOB: February 02, 1946, 66 y.o.   MRN: [id number]  Subjective:   HPI: This is a 66 year old female presenting today for ER followup. She states she suffered a fall from home after tending to take a step and losing support from her right leg. Evaluation in the emergency room including MRI were negative for CVA or other concerning process. Patient states she is not had any similar symptoms or falls since discharge home. She denies any tingling, numbness, or weakness in any extremity. She denies difficulty speaking, visual change, syncope, dizziness, chest pain, or other concerning symptoms.  Patient continues to suffer from significant stress, depression, and anxiety about her difficult social situation. She is planning to go to [facility] for further assistance with her psychiatric medications and general mental health.  She has numerous adverse effects and intolerances to number of SSRIs, benzodiazepines and antipsychotics; will defer any further adjustment of her psychiatric medications to a mental health care provider. She currently denies suicidal or homicidal ideation.    Review of Systems  Constitutional: Negative for fever, chills, diaphoresis, activity change, appetite change, fatigue and unexpected weight change.  HENT: Negative for hearing loss, congestion and neck stiffness.   Eyes: Negative for photophobia, pain and visual disturbance.  Respiratory: Negative for chest tightness, shortness of breath, and hemoptysis  Cardiovascular: Negative for chest pain and palpitations.  Gastrointestinal: Negative for abdominal pain, blood in stool and anal bleeding.  Genitourinary: Negative for dysuria, hematuria and difficulty urinating.  Musculoskeletal: Negative for joint swelling.  Neurological: Negative for dizziness, syncope, speech difficulty, weakness, numbness and headaches.  Psychiatric/Behavioral: Positive for sleep disturbance and dysphoric mood. Negative for  suicidal ideas, hallucinations, behavioral problems, confusion and self-injury. The patient is nervous/anxious. The patient is not hyperactive.        Objective:   Physical Exam GEN: No apparent distress.  Alert and oriented x 3.  Patient tearful  during the exam but conversant and cooperative. HEENT: head is autraumatic and normocephalic Vision intact.  EOMI.  PERRLA.  Sclerae anicteric.  Conjunctivae without pallor or injection.  RESP:  Decreased breath sounds bilaterally but good air movement; unchanged from prior exams.  No wheezes, ronchi, or rubs. CARDIOVASCULAR: regular rate, normal rhythm.  Clear S1, S2, no murmurs, gallops, or rubs. ABDOMEN: soft, non-tender, non-distended.  Bowels sounds present in all quadrants and normoactive.  No palpable masses. EXT: warm and dry.  No clubbing or cyanosis.  No edema in bilateral lower extremities. NEURO: Patient is fully alert and oriented. Cranial nerves II through XII are intact. Muscle strength is +4 globally. Sensation is grossly intact. No focal deficit

## 2011-06-26 NOTE — Assessment & Plan Note (Signed)
Patient continues to experience extreme stress and worsening of her depression. She denies suicidal or homicidal ideation. Encouraged her to followup at [facility]. I have asked her to contact the clinic if her mood further declines and she develops any suicidal or homicidal ideation. Patient is due this.

## 2011-06-26 NOTE — Assessment & Plan Note (Signed)
Discussed importance of pneumonia vaccine. Encouraged patient to proceed with vaccination in the setting of her underlying COPD. Patient states she will consider this but is not interested in proceeding with vaccination at this time. Will followup on this with her next office visit. We'll also followup on her regarding her desire to proceed with screening mammography and fecal occult blood testing to screen for colon cancer.

## 2011-07-07 ENCOUNTER — Other Ambulatory Visit: Payer: Self-pay | Admitting: *Deleted

## 2011-07-08 MED ORDER — FUROSEMIDE 40 MG PO TABS: 40.0000 mg | ORAL_TABLET | Freq: Every day | ORAL | Status: DC

## 2011-07-17 ENCOUNTER — Ambulatory Visit: Payer: Medicaid Other | Admitting: *Deleted

## 2011-07-17 DIAGNOSIS — Z299 Encounter for prophylactic measures, unspecified: Secondary | ICD-10-CM

## 2011-07-17 MED ORDER — PNEUMOCOCCAL VAC POLYVALENT 25 MCG/0.5ML IJ INJ: 0.5000 mL | INJECTION | Freq: Once | INTRAMUSCULAR | Status: DC

## 2011-07-23 ENCOUNTER — Telehealth: Payer: Self-pay | Admitting: *Deleted

## 2011-07-23 NOTE — Telephone Encounter (Signed)
Pt called only wants to talk to [place] - [phone] Room [phone]. Called pt and told her Myriam Jacobson was out for medical leave. Said thank you and hung up. Stanton Kidney Keilyn Haggard RN 07/23/11 2:50PM

## 2011-07-27 ENCOUNTER — Telehealth: Payer: Self-pay | Admitting: Licensed Clinical Social Worker

## 2011-07-27 NOTE — Telephone Encounter (Signed)
CSW spoke with Ms. Schroeter over the telephone.  Discussed free exercise programs in the area, none of which are water aerobics.  Ms. Dobosz states she can only do water aerobics due to her multiple health issues.  CSW informed pt to research the [employer] programs when she is eligible for Medicare, as this program would have water aerobics available.  CSW discussed the required information needed for submission of motorized wheelchair, pt aware and is has a [facility] appt this week.  Pt aware CSW will fax documentation to [facility] when on chart.

## 2011-07-27 NOTE — Telephone Encounter (Signed)
CSW received referral from Triage RN.  Pt placed call to triage inquiring about a motorized wheelchair and free water aerobics classes.  CSW placed call to pt's room, line was busy.  CSW will refer Kelsey Garcia to all Free programs through the [place] for [employer].  CSW will inform pt of need to have Face-to-Face office exam for power mobility.  Pt is scheduled for an appt on 07/30/11.  CSW will attempt to call pt at later time.

## 2011-07-30 ENCOUNTER — Encounter: Payer: Medicaid Other | Admitting: Internal Medicine

## 2011-08-12 ENCOUNTER — Other Ambulatory Visit: Payer: Self-pay | Admitting: *Deleted

## 2011-08-12 MED ORDER — HYDROCODONE-HOMATROPINE 5-1.5 MG/5ML PO SYRP: 5.0000 mL | ORAL_SOLUTION | Freq: Four times a day (QID) | ORAL | Status: DC | PRN

## 2011-08-12 NOTE — Telephone Encounter (Signed)
Last refill of hycodan was 5/28 per pharmacy

## 2011-08-12 NOTE — Addendum Note (Signed)
Addended by: Blanch Media A on: 08/12/2011 05:24 PM   Modules accepted: Orders

## 2011-08-12 NOTE — Telephone Encounter (Signed)
Rx called in 

## 2011-08-12 NOTE — Telephone Encounter (Signed)
Denied. Hycodan is for acute tx of cough. Cough was not mentioned last visit. If I remember correctly, on oral opioids.

## 2011-08-12 NOTE — Telephone Encounter (Signed)
This is not an appropriate use of a narcotic cough syrup. Since she has been on it years and we have been automatically refilling it, will refill one month only and when she sees Dr Clyde Lundborg, can tell her we will be titrating her off of it.

## 2011-08-12 NOTE — Telephone Encounter (Signed)
Pt informed and she has been taking this med for years. Pt has an appointment on 7/12. A bottle last her a month.  She uses 1/2 teaspoon at a time.  Will you refill until her appointment.

## 2011-08-21 ENCOUNTER — Encounter: Payer: Self-pay | Admitting: Internal Medicine

## 2011-08-21 ENCOUNTER — Ambulatory Visit (INDEPENDENT_AMBULATORY_CARE_PROVIDER_SITE_OTHER): Payer: Medicaid Other | Admitting: Internal Medicine

## 2011-08-21 VITALS — BP 127/74 | HR 81 | Temp 98.1°F | Ht 64.0 in | Wt 237.7 lb

## 2011-08-21 DIAGNOSIS — J441 Chronic obstructive pulmonary disease with (acute) exacerbation: Secondary | ICD-10-CM

## 2011-08-21 DIAGNOSIS — F329 Major depressive disorder, single episode, unspecified: Secondary | ICD-10-CM

## 2011-08-21 DIAGNOSIS — F419 Anxiety disorder, unspecified: Secondary | ICD-10-CM

## 2011-08-21 DIAGNOSIS — F3289 Other specified depressive episodes: Secondary | ICD-10-CM

## 2011-08-21 DIAGNOSIS — F411 Generalized anxiety disorder: Secondary | ICD-10-CM

## 2011-08-21 DIAGNOSIS — J4489 Other specified chronic obstructive pulmonary disease: Secondary | ICD-10-CM

## 2011-08-21 DIAGNOSIS — J449 Chronic obstructive pulmonary disease, unspecified: Secondary | ICD-10-CM

## 2011-08-21 MED ORDER — DOXYCYCLINE HYCLATE 50 MG PO CAPS: 100.0000 mg | ORAL_CAPSULE | Freq: Two times a day (BID) | ORAL | Status: DC

## 2011-08-21 MED ORDER — DIAZEPAM 2 MG PO TABS: 4.0000 mg | ORAL_TABLET | Freq: Two times a day (BID) | ORAL | Status: DC | PRN

## 2011-08-21 MED ORDER — PREDNISONE (PAK) 10 MG PO TABS: ORAL_TABLET | ORAL | Status: AC

## 2011-08-21 NOTE — Assessment & Plan Note (Signed)
Patient looks very anxious today, but she doesn't have suicidal homicidal ideation. She reports that her Xanax is not effective to control her anxiety. She had a good experience of using Valium in the past. Because patient also has active symptoms for depression, I suggested her to go back to psychiatry clinic. At the same times I give her a prescription for short-term and prn based Valium, 4 mg twice a day. Patient said she does not need to have psychiatry clinic referral because she already established care with them and she can call to get an appointment at anytime.
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
## 2011-08-21 NOTE — Patient Instructions (Signed)
1.  Please take all medications as prescribed.  2.  If you have worsening of your symptoms or new symptoms arise, please call the clinic [phone]), or go to the ER immediately if symptoms are severe.  Chronic Obstructive Pulmonary Disease Chronic obstructive pulmonary disease (COPD) is a condition in which airflow from the lungs is restricted. The lungs can never return to normal, but there are measures you can take which will improve them and make you feel better. CAUSES   Smoking.   Exposure to secondhand smoke.   Breathing in irritants (pollution, cigarette smoke, strong smells, aerosol sprays, paint fumes).   History of lung infections.  TREATMENT  Treatment focuses on making you comfortable (supportive care). Your caregiver may prescribe medications (inhaled or pills) to help improve your breathing. HOME CARE INSTRUCTIONS   If you smoke, stop smoking.   Avoid exposure to smoke, chemicals, and fumes that aggravate your breathing.   Take antibiotic medicines as directed by your caregiver.   Avoid medicines that dry up your system and slow down the elimination of secretions (antihistamines and cough syrups). This decreases respiratory capacity and may lead to infections.   Drink enough water and fluids to keep your urine clear or pale yellow. This loosens secretions.   Use humidifiers at home and at your bedside if they do not make breathing difficult.   Receive all protective vaccines your caregiver suggests, especially pneumococcal and influenza.   Use home oxygen as suggested.   Stay active. Exercise and physical activity will help maintain your ability to do things you want to do.   Eat a healthy diet.  SEEK MEDICAL CARE IF:   You develop pus-like mucus (sputum).   Breathing is more labored or exercise becomes difficult to do.   You are running out of the medicine you take for your breathing.  SEEK IMMEDIATE MEDICAL CARE IF:   You have a rapid heart rate.   You  have agitation, confusion, tremors, or are in a stupor (family members may need to observe this).   It becomes difficult to breathe.   You develop chest pain.   You have a fever.  MAKE SURE YOU:   Understand these instructions.   Will watch your condition.   Will get help right away if you are not doing well or get worse.  Document Released: 11/05/2004 Document Revised: 01/15/2011 Document Reviewed: 03/28/2010 [facility] Patient Information 2012 [place], [place].

## 2011-08-21 NOTE — Assessment & Plan Note (Signed)
Patient still has active symptoms of depression, but she doesn't have suicidal and homicidal ideation currently. She asked for Ambilify prescription. Because she has active symptoms for both depression and anxiety, I think the best choice would be to let her go back to psychiatry clinic. I give her a choices of increasing her current Prozac dose from 60 to 80 mg daily, but she refused it. She says she doesn't need psychiatry clinic referral, because she has established care with them and she can call for an appointment at anytime. I suggested to her to call them as early as possible and she agreed to do so.

## 2011-08-21 NOTE — Assessment & Plan Note (Addendum)
Patient respiratory symptoms are most likely caused by mild COPD exacerbation. Her lung auscultation has wheezing bilaterally without rales or crackles. We'll treat the patient with 5 days of doxycycline and tapering dose of prednisone for 6 days. We'll followup patient in 2 weeks. Patient has been on Hycodan for cough for a long time. She said her cough is tolerable and she is not taking Hycodan currently. I explained to her that if she has a severe cough, she should be evaluated at clinic for the underlying cause of coughing. She understood this. I did not give her a new prescription for Hycodan refill.

## 2011-08-22 ENCOUNTER — Telehealth: Payer: Self-pay | Admitting: Internal Medicine

## 2011-08-22 DIAGNOSIS — J441 Chronic obstructive pulmonary disease with (acute) exacerbation: Secondary | ICD-10-CM

## 2011-08-22 MED ORDER — DOXYCYCLINE HYCLATE 50 MG PO CAPS: 100.0000 mg | ORAL_CAPSULE | Freq: Two times a day (BID) | ORAL | Status: AC

## 2011-08-22 NOTE — Telephone Encounter (Signed)
Pt called for inadequate doxycycline prescription fill. Dr. Clyde Lundborg supposedly wanted 5 days of doxycycline treatment- 100 mg twice a day- which would need 20 capsules for 50 mg each. - They were upset about short course of antibiotics and also inadequate numbers. - I changed the prescription to 100 mg twice a day for 7 days total and talked with the pharmacist who will refill extra 18 capsules.

## 2011-08-24 ENCOUNTER — Telehealth: Payer: Self-pay | Admitting: Licensed Clinical Social Worker

## 2011-08-24 ENCOUNTER — Telehealth: Payer: Self-pay | Admitting: *Deleted

## 2011-08-24 NOTE — Telephone Encounter (Signed)
Pt calls c/o appt fri 7/12 w/ dr Clyde Lundborg. She states that he was condescending, intimidating and she felt captive, she states he wrote her prescriptions wrong, didn't listen to her problems or discuss w/ her meds that she felt would be best for her conditions. She is very upset about the valium script and states she had to have EMS come out and "they almost took me to the hospital". Her daughter speaks to me and states she at times "almost has to scrape her off the ceiling" because of her nerves. She also states she wants this all taken care of by the end of the week due to the fact her grchildren will be visiting her and her daughter and as they live in a hotel room her nerves need to be better. She would like more valium, her COPD evaluated again and the use of abilify discussed. appt per chilonb. With dr Dannette Barbara 7/19 at 1330, she is agreeable

## 2011-08-24 NOTE — Telephone Encounter (Signed)
Ms. Mooradian called this morning to voice concern regarding her most recent [facility] appt.  Pt states her MD during this appt was unaware of how to complete/assess her referral for a power wheelchair.  Ms. Hosein states "I could have died, if I weren't an informed consumer", pt complained that her prescription was written incorrectly and had to call herself to have changed.  In addition, Ms. Gressman states her appt increased her anxiety this past weekend and caused an exacerbation to her asthma. Pt reports the amount of valium prescribed "does not begin to address" her level of anxiety.  Ms. Butchko requesting to speak with "someone over the residents, to make sure this does not happen to anyone else".  Pt states MD was pleasant and "will make a good doctor, down the road" but states their was a "language problem, both listening and perception".  Ms. Allshouse states she felt MD was patronizing, by being over empathetic and intimidating by standing over/above her in the doorway.  CSW informed Ms. Mccleery concern will be forwarded to Medical Director for return call to pt.  CSW will provide pt with information needed to complete power wheelchair referral and will supply to her physician.  Ms. Devall has already left message with triage to schedule appt this week with another physician.

## 2011-08-24 NOTE — Telephone Encounter (Signed)
Called pt at 2 PM and no answer

## 2011-08-24 NOTE — Telephone Encounter (Signed)
Called at 4:45 and no answer.

## 2011-08-24 NOTE — Telephone Encounter (Signed)
Called pt at 3:30 PM and no answer.

## 2011-08-25 NOTE — Telephone Encounter (Signed)
Called pt 7/16 at 2 PM at tele number listed in chart [phone]. No answer.

## 2011-08-25 NOTE — Telephone Encounter (Signed)
Spent 10 min on phone with pt. Many complaints / concerns. 1. Abx Rx was wrong. The quantity was insuff and pt informed us and we corrected the Rx. I explained that [facility] is horrible with meds an I have made many mistakes with meds. I agreed with pt that we all need to slow down and take our time with meds Rx's. 2. Dr Clyde Lundborg said he was sorry for her suffering too many times. Stood over and intimidated her. Caused her to have weekend long anxiety attack. Dr Clyde Lundborg is not her PCP so this will no longer be an issue. I apoligized that she did not have a nice visit.  3. She wants [place] to Rx Abilify and her benzo. I explained that Dr Arvilla Market had encouraged her to F/u with [facility]. Pt doesn't want to bc they want her in talk therapy. I explained that Vesta Mixer would be best to address and manage these meds. Will leave up to new PCP to decide whether to take over Rxing these meds.   Has appt 19th.

## 2011-08-25 NOTE — Telephone Encounter (Signed)
Pls see tele note from 7/16. I have tried pt 4 times over 2 days and never got an answer. Will try once more. Start inpt service tomorrow so my ability to call her will be limited.

## 2011-08-25 NOTE — Telephone Encounter (Signed)
Phone #'s [phone] rm [phone]       or [phone]        or [phone] rm [phone]

## 2011-08-26 ENCOUNTER — Other Ambulatory Visit: Payer: Self-pay | Admitting: Internal Medicine

## 2011-08-27 ENCOUNTER — Telehealth: Payer: Self-pay | Admitting: *Deleted
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
## 2011-08-28 ENCOUNTER — Encounter: Payer: Self-pay | Admitting: Internal Medicine

## 2011-08-28 ENCOUNTER — Ambulatory Visit (INDEPENDENT_AMBULATORY_CARE_PROVIDER_SITE_OTHER): Payer: Medicaid Other | Admitting: Internal Medicine

## 2011-08-28 VITALS — BP 123/70 | HR 85 | Temp 98.0°F | Ht 65.0 in | Wt 235.8 lb

## 2011-08-28 DIAGNOSIS — I10 Essential (primary) hypertension: Secondary | ICD-10-CM

## 2011-08-28 DIAGNOSIS — F419 Anxiety disorder, unspecified: Secondary | ICD-10-CM

## 2011-08-28 DIAGNOSIS — M1990 Unspecified osteoarthritis, unspecified site: Secondary | ICD-10-CM

## 2011-08-28 DIAGNOSIS — R05 Cough: Secondary | ICD-10-CM | POA: Insufficient documentation

## 2011-08-28 DIAGNOSIS — F411 Generalized anxiety disorder: Secondary | ICD-10-CM

## 2011-08-28 DIAGNOSIS — G8929 Other chronic pain: Secondary | ICD-10-CM

## 2011-08-28 DIAGNOSIS — J449 Chronic obstructive pulmonary disease, unspecified: Secondary | ICD-10-CM

## 2011-08-28 DIAGNOSIS — I4891 Unspecified atrial fibrillation: Secondary | ICD-10-CM

## 2011-08-28 DIAGNOSIS — E785 Hyperlipidemia, unspecified: Secondary | ICD-10-CM

## 2011-08-28 DIAGNOSIS — F17200 Nicotine dependence, unspecified, uncomplicated: Secondary | ICD-10-CM

## 2011-08-28 DIAGNOSIS — R718 Other abnormality of red blood cells: Secondary | ICD-10-CM

## 2011-08-28 DIAGNOSIS — J4489 Other specified chronic obstructive pulmonary disease: Secondary | ICD-10-CM

## 2011-08-28 DIAGNOSIS — Z0000 Encounter for general adult medical examination without abnormal findings: Secondary | ICD-10-CM

## 2011-08-28 DIAGNOSIS — R059 Cough, unspecified: Secondary | ICD-10-CM

## 2011-08-28 LAB — LIPID PANEL
LDL Cholesterol: 104 mg/dL — ABNORMAL HIGH (ref 0–99)
Total CHOL/HDL Ratio: 3 Ratio
Triglycerides: 79 mg/dL (ref <150)
VLDL: 16 mg/dL (ref 0–40)

## 2011-08-28 MED ORDER — DIAZEPAM 2 MG PO TABS: 4.0000 mg | ORAL_TABLET | Freq: Two times a day (BID) | ORAL | Status: DC | PRN

## 2011-08-28 MED ORDER — HYDROCODONE-HOMATROPINE 5-1.5 MG/5ML PO SYRP: 5.0000 mL | ORAL_SOLUTION | Freq: Four times a day (QID) | ORAL | Status: DC | PRN

## 2011-08-28 MED ORDER — ALBUTEROL SULFATE HFA 108 (90 BASE) MCG/ACT IN AERS: 2.0000 | INHALATION_SPRAY | Freq: Four times a day (QID) | RESPIRATORY_TRACT | Status: DC | PRN

## 2011-08-28 NOTE — Patient Instructions (Signed)
Please call and make an appointment with [facility]. We can not make that appointment for you. See info.  They will be prescribing your mental health medications in the future.

## 2011-08-29 ENCOUNTER — Encounter: Payer: Self-pay | Admitting: Internal Medicine
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
## 2011-08-30 ENCOUNTER — Encounter: Payer: Self-pay | Admitting: Internal Medicine

## 2011-08-30 NOTE — Assessment & Plan Note (Addendum)
-  Increasing and uncontrolled due to life stressors -Pt given info for [facility] to establish care -Informed pt that Valium 2 mg (4 mg bid) will be refilled for short term only #56 no refills (2 week supply) -Pt is supposed to establish care at [facility] from this pt because anxiety is not being controlled and psychiatry specializes in her needs. -Pt understands IM will no longer prescribe her any psychiatric meds and she must seek mental health tx

## 2011-08-30 NOTE — Assessment & Plan Note (Signed)
Lipid panel obtained today Lipid Panel     Component Value Date/Time   CHOL 179 08/28/2011 1514   TRIG 79 08/28/2011 1514   HDL 59 08/28/2011 1514   CHOLHDL 3.0 08/28/2011 1514   VLDL 16 08/28/2011 1514   LDLCALC 104* 08/28/2011 1514   LDL slightly beyond goal-PCP can address at f/u

## 2011-08-30 NOTE — Assessment & Plan Note (Signed)
Rate controlled on Diltiazem 420 mg qd

## 2011-08-30 NOTE — Assessment & Plan Note (Signed)
Elevated MCV on recent cbc Ordered B12 and folate (per pt she has received b12 shots in the past) Will f/u results labs

## 2011-08-30 NOTE — Assessment & Plan Note (Addendum)
Pt had pneumococcal vx 07/2011 Mammogram neg 10/2008 Pt declines pap and colonoscopy as of now Pt states she went to the eye MD 4 days ago

## 2011-08-30 NOTE — Assessment & Plan Note (Signed)
-  Rec. Smoking cessation with COPD hx -pt now smoking 1 ppd -ed consequences of smoking near O2 tank-pt aware

## 2011-08-30 NOTE — Assessment & Plan Note (Signed)
Short term course of Hycodan refilled no refills Pt does not need to be on chronic cough suppressant

## 2011-08-30 NOTE — Assessment & Plan Note (Addendum)
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

## 2011-08-30 NOTE — Assessment & Plan Note (Signed)
BP controlled today 123/70 Pt on Lasix 40 qd, Diltiazem 420 mg qd

## 2011-08-30 NOTE — Assessment & Plan Note (Signed)
Pt still smoking 1ppd-rec cessation Finished Doxy 100 mg bid course due to exacerbation and still on prednisone taper Needs Rx refill Albuterol-given Pt cont to use Symbicort, Spiriva

## 2011-08-31 ENCOUNTER — Encounter: Payer: Self-pay | Admitting: Internal Medicine

## 2011-08-31 ENCOUNTER — Telehealth: Payer: Self-pay | Admitting: *Deleted

## 2011-08-31 MED ORDER — FLUCONAZOLE 100 MG PO TABS: 200.0000 mg | ORAL_TABLET | Freq: Every day | ORAL | Status: DC

## 2011-08-31 NOTE — Addendum Note (Signed)
Addended by: Annett Gula on: 08/31/2011 05:58 PM   Modules accepted: Orders

## 2011-08-31 NOTE — Progress Notes (Signed)
agree

## 2011-08-31 NOTE — Telephone Encounter (Signed)
CALLED PATIENT X 4 WITH  NO ANSWER. CALLED THE WORK PHONE #  [phone], RECORDING CAME ON AND ENDED. NO WAY TO LEAVE A MESSAGE ON THIS RECORDING. DR [facility] IS SENDING PATIENT A LETTER TO FOLLOW UP ON HER REQUEST FOR RX FOR THRUSH.  Kelsey Garcia NT 7/22/013. 2:01PM

## 2011-09-01 ENCOUNTER — Telehealth: Payer: Self-pay | Admitting: Internal Medicine

## 2011-09-01 NOTE — Telephone Encounter (Signed)
Attempted to call two numbers listed for pt yesterday bc pt called clinic 08/31/11 requesting a medication for thrush.  At office visit 08/28/11 pt did not mention thrush to [facility]clean or RN LeLa and no thrush was noted on exam.  Attempted to call pt to see if she wanted to be seen, wanted to try Diflucan 200 mg x 1 po.  Discussed with Dr. Aundria Rud who stated these options.  I called in Diflucan x 1 no RF and sent patient a letter to address her medical issues at office visits so we can evaluate at that time.   Shirlee Latch

## 2011-09-02 ENCOUNTER — Other Ambulatory Visit: Payer: Self-pay | Admitting: *Deleted

## 2011-09-02 DIAGNOSIS — J449 Chronic obstructive pulmonary disease, unspecified: Secondary | ICD-10-CM

## 2011-09-02 MED ORDER — TIOTROPIUM BROMIDE MONOHYDRATE 18 MCG IN CAPS: 18.0000 ug | ORAL_CAPSULE | Freq: Every day | RESPIRATORY_TRACT | Status: DC

## 2011-09-02 NOTE — Telephone Encounter (Signed)
Pt states she usually gets #60, looks like she did in the past and most recently the pharmacist states "she has been getting 15 day supplies". The pharmacy has sent me a printout from April up to now and that is available in my office, we may scan it but it may prove useful just to do a state narcotic tracking inquiry and scan that into chart. Let me know which, thanks.

## 2011-09-04 ENCOUNTER — Telehealth: Payer: Self-pay | Admitting: Licensed Clinical Social Worker

## 2011-09-04 ENCOUNTER — Other Ambulatory Visit: Payer: Self-pay | Admitting: *Deleted

## 2011-09-04 NOTE — Telephone Encounter (Signed)
Pt called and will pick up med at pharmacy 09/08/11.

## 2011-09-04 NOTE — Telephone Encounter (Signed)
CSW returned call to [name].  Pt states she has been receiving letters regarding signing up for Medicare.  [name] unsure as to what medicare will help her with "medicare doesn't impress me".  CSW briefly explained the benefits of Medicare that medicare will be primary and medicaid secondary.  Provided [name] with [facility] contact information to discuss medicare plans and benefits.  Pt states she will call on Monday.  [name] questioning status of obtaining a power wheelchair.  CSW explained the current process as [facility] will need to obtain insurance authorization and schedule appt with physician and DME agency.  Pt aware [facility] is in the process with insurance.  Pt denies add'l needs at this time.

## 2011-09-07 MED ORDER — HYDROCODONE-ACETAMINOPHEN 7.5-500 MG PO TABS: 1.0000 | ORAL_TABLET | Freq: Four times a day (QID) | ORAL | Status: DC | PRN

## 2011-09-07 NOTE — Telephone Encounter (Signed)
Rx called in to pharmacy. 

## 2011-09-07 NOTE — Telephone Encounter (Signed)
I refilled this medication in accordance with patient's controlled medication contract that she signed with Dr. Arvilla Market in January of this year - nurse to phone in.  The contract also lists Hycodan cough syrup at the quantity recently refilled.  Management of her chronic pain and cough should be discussed at her next PCP visit.  I would advise her not to use the Hycodan cough syrup at the same time as the hydrocodone tablets.

## 2011-09-15 ENCOUNTER — Other Ambulatory Visit: Payer: Self-pay | Admitting: *Deleted

## 2011-09-15 ENCOUNTER — Telehealth: Payer: Self-pay | Admitting: Internal Medicine

## 2011-09-15 NOTE — Telephone Encounter (Addendum)
[phone]  Spoke with patient about letter written to me and regarding thrush.  Advised her that I called in her thrush medication but she needs to be evaluated at the time of visit for diagnoses before a Rx can be called into the pharmacy unless it is a chronic medication refill.  She understands.  She mentions that after every COPD exacerbation a Rx for thrush needs to be called in for her.  I advised her to make other providers aware since this was my first time seeing her in clinic and this seems to be her trend.  She did not mention thrush to either myself or the nurse that visit but called in later for the Rx. She understands.    Shirlee Latch

## 2011-09-15 NOTE — Telephone Encounter (Signed)
Did you want to refill this medication

## 2011-09-15 NOTE — Telephone Encounter (Signed)
Last refill 7/19

## 2011-09-15 NOTE — Telephone Encounter (Signed)
[phone]  Spoke with patient about letter written to me and regarding thrush.  Advised her that I called in her thrush medication but she needs to be evaluated at the time of visit for diagnoses before a Rx can be called into the pharmacy unless it is a chronic medication refill.  She understands.  She mentions that after every COPD exacerbation a Rx for thrush needs to be called in for her.  I advised her to make other providers aware since this was my first time seeing her in clinic and this seems to be her trend.  She did not mention thrush to either myself or the nurse that visit but called in later for the Rx. She understands.    [place]  

## 2011-09-16 NOTE — Telephone Encounter (Signed)
Pt informed and voices understanding. Appointment made for next week to discuss this.

## 2011-09-23 ENCOUNTER — Encounter: Payer: Self-pay | Admitting: Internal Medicine

## 2011-09-23 ENCOUNTER — Ambulatory Visit (INDEPENDENT_AMBULATORY_CARE_PROVIDER_SITE_OTHER): Payer: Medicaid Other | Admitting: Internal Medicine

## 2011-09-23 VITALS — BP 135/85 | HR 80 | Temp 98.5°F | Wt 235.4 lb

## 2011-09-23 DIAGNOSIS — M169 Osteoarthritis of hip, unspecified: Secondary | ICD-10-CM

## 2011-09-23 DIAGNOSIS — R05 Cough: Secondary | ICD-10-CM

## 2011-09-23 MED ORDER — SULINDAC 150 MG PO TABS: 150.0000 mg | ORAL_TABLET | Freq: Two times a day (BID) | ORAL | Status: DC

## 2011-09-23 MED ORDER — PANTOPRAZOLE SODIUM 40 MG PO TBEC: 40.0000 mg | DELAYED_RELEASE_TABLET | Freq: Every day | ORAL | Status: DC

## 2011-09-23 MED ORDER — CAPSAICIN 0.035 % EX CREA: 1.0000 "application " | TOPICAL_CREAM | Freq: Three times a day (TID) | CUTANEOUS | Status: DC | PRN

## 2011-09-23 MED ORDER — HYDROCODONE-ACETAMINOPHEN 7.5-500 MG PO TABS: 1.0000 | ORAL_TABLET | Freq: Two times a day (BID) | ORAL | Status: DC | PRN

## 2011-09-23 NOTE — Patient Instructions (Signed)
-  Please start taking protonix 40mg  every morning - this should help with cough.  -I have refilled your Lortab (Hydrocodone-Acetaminophen) for 1 tablet twice daily as needed.  I have allowed for on average 2 tablets for day, which would last you 30 days.    -I have also sent in a medication called Sulindac, you will take 150mg  twice daily.  Also, I sent in a Capsacin cream for you to apply up to 3-4 times daily to affected painful areas.   Please be sure to bring all of your medications with you to every visit.  Should you have any new or worsening symptoms, please be sure to call the clinic at [phone].
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
## 2011-09-23 NOTE — Assessment & Plan Note (Signed)
I suspect that this is related both to her COPD and history of reflux disease. She has been off of Zantac for several months, and off of Prilosec for several years. At one point it was unclear if Prilosec was helpful. She notes Zantac is definitely not helpful.  -No Hycodan refill at this time, as this is inappropriate chronic cough therapy -Trial of Protonix 40 mg daily

## 2011-09-23 NOTE — Assessment & Plan Note (Addendum)
Patient has a right hip x-ray from 2010 that reveals mild degenerative disease of the SI joint. Today she also complains of left knee pain. There is an x-ray from 2011 of her left knee, that cannot exclude a tiny left knee joint  Effusion, but no bony abnormalities. She's been taking Vicodin for years. Her Vicodin 7.5-500mg  was just refilled on July 29, with 60 tablets every 6 hours when necessary, which she is completed; she reports to the last tablet today. She was asked to give a panel 15 urine sample, but reports that she cannot urinate as she urinated just prior to her appointment. Initially she reported that she felt insulted, but I explained that it is our clinic policy. Even still, she reported that she just could not urinate. She was in a hurry, as her ride was going to leave. She initiated a pain contract with Dr. Arvilla Market for chronic pain of her Vicodin and Hycodan on 02/12/2011.  She tells me that the following medications have failed: Ibuprofen, tramadol, Mobic, naproxen, voltaren gel The following medications relieve her pain: Tylenol (occasionally) and Vicodin She has not tried the following medications: Sulindac, capsaicin cream, Lidoderm patch  Plan: -Consider panel 15 at next visit (if she endorses taking Vicodin) -Refill Vicodin 7.5-500 mg one tablet twice a day as needed #60 - will not consider further refill until her next visit, as I want her to give a fair trial to the sulindac and capsaicin cream - refill is with decreased frequency, as her current prescription is written for every 6 hours. I'm refilling Vicodin at this time, as I don't want patient to be an uncomfortable pain to the point where she doesn't give my other therapies a fair trial, so I'm asking that she use Vicodin as a supplement -Sulindac 150 mg twice daily -Capsaicin cream 3-4 times daily  -If she fails sulindac, we may consider a Lidoderm patch, as Medicaid will cover this medication if she failed NSAIDs  completely -If we decide that we were going to continue filling narcotics for her chronic pain, she may require a new pain contract, possibly without Hycodan  Of note, patient's last urine drug screen in August 2012 was positive for hydrocodone, but negative for hydromorphone; this is suggestive of pill dipping in urine, we'll need to closely monitor if we decide to prescribe chronic opiates  ADDENDUM: Patient received Vicodin refill of 60 tablet every 6 hours when necessary for pain on 09/07/2011; her contract is written for 60 tablets per month; she has requested refill too soon, but I had already filled prescription; I will need to proceed with more caution regarding her opiate medication in the future if continued

## 2011-09-24 ENCOUNTER — Telehealth: Payer: Self-pay | Admitting: *Deleted

## 2011-09-24 NOTE — Telephone Encounter (Signed)
Pt called and left message stating she picked up capsaicin cream and applied to knee, this caused a skin reaction and she discarded the medicine. She washed the area and applied hydrocortisone cream.   Her question is if you were supposed to help her why did you hurt her?  Pt's phone message was forwarded to doctor.

## 2011-10-01 ENCOUNTER — Other Ambulatory Visit: Payer: Self-pay

## 2011-10-01 ENCOUNTER — Emergency Department (HOSPITAL_COMMUNITY)
Admission: EM | Admit: 2011-10-01 | Discharge: 2011-10-01 | Disposition: A | Discharge status: Home / Self Care | Payer: Medicaid Other | Attending: Emergency Medicine | Admitting: Emergency Medicine

## 2011-10-01 ENCOUNTER — Emergency Department (HOSPITAL_COMMUNITY): Payer: Medicaid Other

## 2011-10-01 ENCOUNTER — Encounter (HOSPITAL_COMMUNITY): Payer: Self-pay

## 2011-10-01 DIAGNOSIS — J441 Chronic obstructive pulmonary disease with (acute) exacerbation: Secondary | ICD-10-CM | POA: Insufficient documentation

## 2011-10-01 DIAGNOSIS — Z881 Allergy status to other antibiotic agents status: Secondary | ICD-10-CM | POA: Insufficient documentation

## 2011-10-01 DIAGNOSIS — Z8489 Family history of other specified conditions: Secondary | ICD-10-CM | POA: Insufficient documentation

## 2011-10-01 DIAGNOSIS — I10 Essential (primary) hypertension: Secondary | ICD-10-CM | POA: Insufficient documentation

## 2011-10-01 DIAGNOSIS — I4891 Unspecified atrial fibrillation: Secondary | ICD-10-CM | POA: Insufficient documentation

## 2011-10-01 DIAGNOSIS — Z888 Allergy status to other drugs, medicaments and biological substances status: Secondary | ICD-10-CM | POA: Insufficient documentation

## 2011-10-01 DIAGNOSIS — F17200 Nicotine dependence, unspecified, uncomplicated: Secondary | ICD-10-CM | POA: Insufficient documentation

## 2011-10-01 DIAGNOSIS — R059 Cough, unspecified: Secondary | ICD-10-CM | POA: Insufficient documentation

## 2011-10-01 DIAGNOSIS — R05 Cough: Secondary | ICD-10-CM

## 2011-10-01 DIAGNOSIS — Z825 Family history of asthma and other chronic lower respiratory diseases: Secondary | ICD-10-CM | POA: Insufficient documentation

## 2011-10-01 DIAGNOSIS — Z809 Family history of malignant neoplasm, unspecified: Secondary | ICD-10-CM | POA: Insufficient documentation

## 2011-10-01 HISTORY — DX: Allergic rhinitis, unspecified: J30.9

## 2011-10-01 MED ORDER — IPRATROPIUM BROMIDE 0.02 % IN SOLN
0.5000 mg | RESPIRATORY_TRACT | Status: AC
  Administered 2011-10-01: 0.5 mg via RESPIRATORY_TRACT
  Filled 2011-10-01: qty 2.5

## 2011-10-01 MED ORDER — PREDNISONE 20 MG PO TABS
60.0000 mg | ORAL_TABLET | Freq: Once | ORAL | Status: AC
  Administered 2011-10-01: 60 mg via ORAL
  Filled 2011-10-01: qty 3

## 2011-10-01 MED ORDER — HYDROCODONE-HOMATROPINE 5-1.5 MG/5ML PO SYRP: 5.0000 mL | ORAL_SOLUTION | Freq: Three times a day (TID) | ORAL | Status: DC | PRN

## 2011-10-01 MED ORDER — AZITHROMYCIN 250 MG PO TABS: 500.0000 mg | ORAL_TABLET | Freq: Every day | ORAL | Status: AC

## 2011-10-01 MED ORDER — ALBUTEROL SULFATE (5 MG/ML) 0.5% IN NEBU
2.5000 mg | INHALATION_SOLUTION | RESPIRATORY_TRACT | Status: AC
  Administered 2011-10-01: 2.5 mg via RESPIRATORY_TRACT
  Filled 2011-10-01: qty 1

## 2011-10-01 MED ORDER — PREDNISONE 20 MG PO TABS: 60.0000 mg | ORAL_TABLET | Freq: Every day | ORAL | Status: AC

## 2011-10-01 NOTE — ED Notes (Signed)
Pt presents to ED with sob since June. Pt states symptoms seem to be getting worse. Pt states symptoms get worse on exertion. Pt also c/o coughing, bilateral leg swelling and fatigue.

## 2011-10-01 NOTE — ED Notes (Signed)
Pt d/c home in NAD. Pt voiced understanding of d/c instructions and follow up care. Pt instructed not to drive after taking hydrocan.

## 2011-10-01 NOTE — ED Notes (Signed)
Patient transported to X-ray 

## 2011-10-01 NOTE — ED Provider Notes (Signed)
History     CSN: [id number]  Arrival date & time 10/01/11  4098   First MD Initiated Contact with Patient 10/01/11 [phone]      Chief Complaint  Patient presents with  . Shortness of Breath    (Consider location/radiation/quality/duration/timing/severity/associated sxs/prior treatment) HPIDale F Garcia is a 66 y.o. female with an extensive medical history including atrial fibrillation, currently not anticoagulated but being rate controlled, also h/o COPD who continues to smoke less than one half pack cigarettes daily with 30+ pack-year history. Patient says she has felt like this, short of breath with a chronic cough that is worse at night and keeps her up since June this is when she received a pneumonia vaccine. In addition, she quit smoking in June but to do "problems with her physician" she started smoking again has been having problems since. She uses her rescue inhaler 5-6 times daily, she is on 2 L home O2, she says her oxygen saturations did not go below 95% at home, her cough is nonproductive. She says she also has an allergy component to her cough occasionally with runny nose, congestion for which she takes Claritin daily. She denies any fevers, chills, nausea vomiting, diaphoresis, chest pain, palpitations.   Past Medical History  Diagnosis Date  . Atrial fibrillation   . Hypertension   . Insomnia   . OCD (obsessive compulsive disorder)   . Nonischemic cardiomyopathy   . Hyperlipidemia   . Anxiety   . OCD (obsessive compulsive disorder)   . Nicotine addiction   . Depression     h/o SI not active as of 7/13  . Cholelithiasis   . DJD (degenerative joint disease)   . Insomnia   . Mitral regurgitation     noted 2010  . COPD (chronic obstructive pulmonary disease)     as of 7/13 on 2-3L, pfts 10/2008 with mod obstruction  . H/O epistaxis   . Rhinitis, allergic     Past Surgical History  Procedure Date  . Tubal ligation     Family History  Problem Relation Age of Onset    . Asthma    . Emphysema    . Allergies    . Cancer      aunt had several types of cancer    History  Substance Use Topics  . Smoking status: Current Everyday Smoker -- 0.5 packs/day    Types: Cigarettes  . Smokeless tobacco: Not on file   Comment: states she smokes now and then.Sometimes none sometimes one a day for nerves  . Alcohol Use: Yes     very rare, special ocassions    OB History    Grav Para Term Preterm Abortions TAB SAB Ect Mult Living                  Review of Systems GENERAL: No fevers or chills, weight loss. HEENT: No change in vision, no earache, sore throat or sinus congestion. NECK: No pain or stiffness. CARDIOVASCULAR: No chest pain or pressure, palpitations, syncope. PULMONARY: Positive for chronic shortness of breath, chronic cough. GASTROINTESTINAL: No abdominal pain, nausea, vomiting or diarrhea. GENITOURINARY: No urinary frequency, urgency, hesitancy or dysuria. MUSCULOSKELETAL: No joint or muscle pain, no back pain, no recent trauma. DERMATOLOGIC: No rash, no itching, no lesions. NEUROLOGIC: No headache, seizures, numbness, tingling or weakness.   Allergies  Diphenhydramine hcl; Erythromycin; Nyquil; Pseudoephedrine; and Tramadol hcl  Home Medications   Current Outpatient Rx  Name Route Sig Dispense Refill  . ALBUTEROL SULFATE HFA  108 (90 BASE) MCG/ACT IN AERS Inhalation Inhale 2 puffs into the lungs every 6 (six) hours as needed. For wheezing 1 Inhaler 3  . ARIPIPRAZOLE 5 MG PO TABS Oral Take 5 mg by mouth daily.    . ASPIRIN 325 MG PO TABS Oral Take 325 mg by mouth daily.    . BUDESONIDE-FORMOTEROL FUMARATE 160-4.5 MCG/ACT IN AERO Inhalation Inhale 2 puffs into the lungs 2 (two) times daily.    Marland Kitchen DIAZEPAM 2 MG PO TABS Oral Take 2 tablets (4 mg total) by mouth every 12 (twelve) hours as needed for anxiety. 56 tablet 0  . DILTIAZEM HCL ER BEADS 420 MG PO CP24 Oral Take 420 mg by mouth daily.    Marland Kitchen FLUOXETINE HCL 20 MG PO CAPS Oral Take 60 mg by mouth.  Take 3 caps (60 mg) po a day    . FUROSEMIDE 40 MG PO TABS Oral Take 1 tablet (40 mg total) by mouth daily with breakfast. 30 tablet 1  . HYDROCODONE-ACETAMINOPHEN 7.5-500 MG PO TABS Oral Take 1 tablet by mouth every 12 (twelve) hours as needed for pain. 60 tablet 0  . LORATADINE 10 MG PO TABS Oral Take 10 mg by mouth daily.    Marland Kitchen PANTOPRAZOLE SODIUM 40 MG PO TBEC Oral Take 1 tablet (40 mg total) by mouth daily. 30 tablet 1  . POTASSIUM CHLORIDE 20 MEQ PO PACK Oral Take 20 mEq by mouth daily.    Marland Kitchen TIOTROPIUM BROMIDE MONOHYDRATE 18 MCG IN CAPS Inhalation Place 1 capsule (18 mcg total) into inhaler and inhale daily. 90 capsule 1  . CAPSAICIN 0.035 % EX CREA Apply externally Apply 1 application topically 3 (three) times daily as needed. 1 Tube 0  . HYDROCODONE-HOMATROPINE 5-1.5 MG/5ML PO SYRP Oral Take 5 mLs by mouth every 6 (six) hours as needed for cough. For cough 120 mL 0  . RANITIDINE HCL 150 MG PO TABS Oral Take 150 mg by mouth 2 (two) times daily.    . SULINDAC 150 MG PO TABS Oral Take 1 tablet (150 mg total) by mouth 2 (two) times daily. 60 tablet 3    BP 152/74  Pulse 87  Temp 98.4 F (36.9 C) (Oral)  Resp 23  SpO2 97%  Physical Exam  Constitutional: She appears well-developed and well-nourished.  HENT:  Head: Normocephalic and atraumatic.  Right Ear: External ear normal.  Left Ear: External ear normal.  Nose: Nose normal.  Mouth/Throat: Oropharynx is clear and moist.       Mildly erythematous posterior pharynx.  Eyes: Conjunctivae and EOM are normal. Pupils are equal, round, and reactive to light. Right eye exhibits no discharge. Left eye exhibits no discharge. No scleral icterus.  Neck: Normal range of motion. Neck supple. No thyromegaly present.  Cardiovascular: Normal rate and normal heart sounds.        Irregular rhythm  Pulmonary/Chest: No stridor. She has wheezes.       Diffuse wheezing worse in the upper lobes, extended expiratory phase, no consolidation auscultated.    Abdominal: Soft. Bowel sounds are normal. She exhibits no distension. There is no tenderness.  Musculoskeletal: Normal range of motion.  Lymphadenopathy:    She has no cervical adenopathy.  Neurological: She is alert.  Skin: Skin is warm and dry.    ED Course  Procedures (including critical care time)  Labs Reviewed - No data to display No results found.   No diagnosis found.    MDM  KARLYN GLASCO is a 66 y.o.  female presenting with likely COPD exacerbation. Patient was recently taken off of a cough syrup which she said had worked for her in the past. Patient GERD medicine was changed also in an effort to reduce her GERD symptoms which is thought to be a possibility of causing her cough.    EKG Interpretation  Rhythm: Irregularly irregular Rate: normal Axis: normal Ectopy: none Conduction: normal ST Segments: no change T Waves: no acute change  Clinical Impression: Fibrillation 84 beats per minute morphology is similar to his prior EKG dated 06/22/2011, no ST or T wave abnormalities consistent with ischemia or infarction.  Treat with Duoneb, prednisone 60mg  po and will ambulate pt and check SpO2. Will check CXR - pt has had a Z-pack in July which did help.  In she denies any recent sick contacts, persistent cough suggestive of pertussis.  Patient is otherwise well appearing, do not think she's got a pneumonia, she does not appear septic. She is having no pain, do not think patient of PE either, she has Wells score of 0, a low pretest probability, do not think any further imaging or testing is needed to rule out PE. An EKG shows atrial fibrillation without any changes prior EKG, I do not think her shortness of breath is cardiac related, do not think she's lying overload or this is related to any kind of pulmonary edema..   Chest x-ray is clear, there is some mild interstitial prominence but no focal consolidation. Patient received good relief from one DuoNeb. Patient did not  desaturate when up walking about with her home oxygen on. Patient like to be discharged home. Patient does have followup in the clinic and is instructed to keep her appointment that she has an approximately 4 weeks, however she is to call and get an earlier appointment if her condition changes. Patient says she does feel better than she has felt in months.  Will prescribe prednisone 60 mg for 4 more days as well as a Z-Pak in addition we'll give her some Hycodan for her cough. I explained the diagnosis in detail and have given standard ER return precautions including chest pain, shortness of breath or worsening condition. The patient understands and accepts the medical plan as it's been dictated and I have answered all questions. Discharge instructions concerning home care and prescriptions have been given.  The patient is STABLE and is discharged to home in good condition.       Jones Skene, MD 10/01/11 1204

## 2011-10-05 ENCOUNTER — Other Ambulatory Visit: Payer: Self-pay | Admitting: *Deleted

## 2011-10-06 ENCOUNTER — Telehealth: Payer: Self-pay | Admitting: *Deleted

## 2011-10-06 MED ORDER — NYSTATIN 100000 UNIT/GM EX POWD: CUTANEOUS | Status: DC

## 2011-10-06 MED ORDER — FUROSEMIDE 40 MG PO TABS: 40.0000 mg | ORAL_TABLET | Freq: Every day | ORAL | Status: DC

## 2011-10-06 NOTE — Telephone Encounter (Signed)
Pt informed

## 2011-10-06 NOTE — Telephone Encounter (Signed)
I have ordered Nystatin swish and split for her.

## 2011-10-06 NOTE — Telephone Encounter (Signed)
Pt called stating she was seen in ED 6 days ago.  She now has thrush and she is wanting a Rx for treatment. Records show ED visit on 8/22 and pt received Z-pak  Pt # [phone]

## 2011-10-07 ENCOUNTER — Telehealth: Payer: Self-pay | Admitting: *Deleted

## 2011-10-07 MED ORDER — NYSTATIN 100000 UNIT/ML MT SUSP: OROMUCOSAL | Status: DC

## 2011-10-07 NOTE — Telephone Encounter (Signed)
Pt calls and states she does not understand the directions on her nystatin, it says " make a solution, swish and swallow" she says that it states on the bottle "do not put in eyes, nose, mouth or vagina" and even if she makes the solution there are no measurements directed". i will call the pharmacy to make sure of this.

## 2011-10-07 NOTE — Telephone Encounter (Signed)
Spoke with pharmacy, they gave her the topical powder because they state that was what was ordered, no mixing directions and they did not call to question this, we will need a new electronic script sent for the correct med, thanks

## 2011-10-13 ENCOUNTER — Telehealth: Payer: Self-pay | Admitting: *Deleted

## 2011-10-13 NOTE — Telephone Encounter (Signed)
Pt calls and states that the medicine for thrush is not doing anything except "holding it at bay", not better, not worse. She would like something in a pill form. Please advise and if possible send electronically. Thank you

## 2011-10-13 NOTE — Telephone Encounter (Signed)
As I have not had the chance to meet Kelsey Garcia if she would like this taken care of before her follow up on 9/20 please have her make an appointment to be seen earlier.  Otherwise we will address this at her follow up appointment.

## 2011-10-13 NOTE — Telephone Encounter (Signed)
i spoke w/ pt and relayed message, she was not happy but states she looks forward to speaking w/ dr Tonny Branch at her appt.

## 2011-10-21 ENCOUNTER — Other Ambulatory Visit: Payer: Self-pay | Admitting: *Deleted

## 2011-10-21 DIAGNOSIS — M1990 Unspecified osteoarthritis, unspecified site: Secondary | ICD-10-CM

## 2011-10-21 DIAGNOSIS — M169 Osteoarthritis of hip, unspecified: Secondary | ICD-10-CM

## 2011-10-21 MED ORDER — HYDROCODONE-ACETAMINOPHEN 7.5-325 MG PO TABS: 1.0000 | ORAL_TABLET | Freq: Four times a day (QID) | ORAL | Status: DC | PRN

## 2011-10-21 NOTE — Telephone Encounter (Signed)
Rx called in to pharmacy - pt aware. 

## 2011-10-30 ENCOUNTER — Encounter: Payer: Self-pay | Admitting: Internal Medicine

## 2011-10-30 ENCOUNTER — Encounter: Payer: Medicaid Other | Admitting: Internal Medicine

## 2011-10-30 ENCOUNTER — Ambulatory Visit (INDEPENDENT_AMBULATORY_CARE_PROVIDER_SITE_OTHER): Payer: Medicaid Other | Admitting: Internal Medicine

## 2011-10-30 VITALS — BP 128/77 | HR 90 | Temp 97.0°F | Ht 66.0 in | Wt 238.8 lb

## 2011-10-30 DIAGNOSIS — K219 Gastro-esophageal reflux disease without esophagitis: Secondary | ICD-10-CM | POA: Insufficient documentation

## 2011-10-30 DIAGNOSIS — Z7409 Other reduced mobility: Secondary | ICD-10-CM

## 2011-10-30 DIAGNOSIS — Z79899 Other long term (current) drug therapy: Secondary | ICD-10-CM

## 2011-10-30 DIAGNOSIS — I10 Essential (primary) hypertension: Secondary | ICD-10-CM

## 2011-10-30 DIAGNOSIS — R69 Illness, unspecified: Secondary | ICD-10-CM

## 2011-10-30 DIAGNOSIS — B370 Candidal stomatitis: Secondary | ICD-10-CM | POA: Insufficient documentation

## 2011-10-30 DIAGNOSIS — M1990 Unspecified osteoarthritis, unspecified site: Secondary | ICD-10-CM

## 2011-10-30 DIAGNOSIS — R05 Cough: Secondary | ICD-10-CM

## 2011-10-30 DIAGNOSIS — J449 Chronic obstructive pulmonary disease, unspecified: Secondary | ICD-10-CM

## 2011-10-30 DIAGNOSIS — Z79891 Long term (current) use of opiate analgesic: Secondary | ICD-10-CM | POA: Insufficient documentation

## 2011-10-30 DIAGNOSIS — M169 Osteoarthritis of hip, unspecified: Secondary | ICD-10-CM

## 2011-10-30 MED ORDER — NYSTATIN 100000 UNIT/ML MT SUSP: OROMUCOSAL | Status: DC

## 2011-10-30 MED ORDER — PANTOPRAZOLE SODIUM 40 MG PO TBEC: 40.0000 mg | DELAYED_RELEASE_TABLET | Freq: Every day | ORAL | Status: DC

## 2011-10-30 MED ORDER — HYDROCODONE-HOMATROPINE 5-1.5 MG/5ML PO SYRP: 5.0000 mL | ORAL_SOLUTION | Freq: Three times a day (TID) | ORAL | Status: DC | PRN

## 2011-10-30 MED ORDER — FLUCONAZOLE 100 MG PO TABS: 100.0000 mg | ORAL_TABLET | Freq: Once | ORAL | Status: DC

## 2011-10-30 NOTE — Patient Instructions (Signed)
1.  We will work on getting you over to the ENT to see if they can help [place] narrow down the cause of your chronic cough.  2.  We need to continue working on cutting down your smoking and quitting.  It is the number 1 best thing that you can do for your health.  3.  We will work on the paperwork for the power wheelchair to send to the Medicare for their review.  4.  Continue your medications as prescribed for now.    5.  Follow up in November to see how you are doing.  Or earlier if you need me.

## 2011-10-30 NOTE — Progress Notes (Signed)
Walking PSO2 with 2LO2  94 - 96% and walking PSO2 with no O2 ( room air )  90 - 94%. Dr Tonny Branch aware. Stanton Kidney Taeveon Keesling RN 10/30/11 11:15AM.
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
## 2011-10-31 LAB — PRESCRIPTION ABUSE MONITORING 15P, URINE
Buprenorphine, Urine: NEGATIVE ng/mL
Methadone Screen, Urine: NEGATIVE ng/mL
Oxycodone Screen, Ur: NEGATIVE ng/mL
Propoxyphene: NEGATIVE ng/mL
Tramadol Scrn, Ur: NEGATIVE ng/mL
Zolpidem, Urine: NEGATIVE ng/mL

## 2011-11-03 ENCOUNTER — Other Ambulatory Visit: Payer: Self-pay | Admitting: *Deleted

## 2011-11-03 LAB — BENZODIAZEPINES (GC/LC/MS), URINE
Alprazolam metabolite (GC/LC/MS), ur confirm: NEGATIVE ng/mL
Clonazepam metabolite (GC/LC/MS), ur confirm: NEGATIVE ng/mL
Diazepam (GC/LC/MS), ur confirm: NEGATIVE ng/mL
Flunitrazepam metabolite (GC/LC/MS), ur confirm: NEGATIVE ng/mL
Lorazepam (GC/LC/MS), ur confirm: NEGATIVE ng/mL
Nordiazepam (GC/LC/MS), ur confirm: 902 ng/mL
Oxazepam (GC/LC/MS), ur confirm: 4157 ng/mL
Temazepam (GC/LC/MS), ur confirm: 642 ng/mL
Triazolam metabolite (GC/LC/MS), ur confirm: NEGATIVE ng/mL

## 2011-11-03 LAB — OPIATES/OPIOIDS (LC/MS-MS)
Codeine Urine: NEGATIVE ng/mL
Heroin (6-AM), UR: NEGATIVE ng/mL
Hydrocodone: 5227 ng/mL
Hydromorphone: NEGATIVE ng/mL
Norhydrocodone, Ur: 3303 ng/mL
Oxycodone, ur: NEGATIVE ng/mL

## 2011-11-03 LAB — FENTANYL (GC/LC/MS), URINE
Fentanyl, confirm: NEGATIVE ng/mL
Norfentanyl, confirm: NEGATIVE ng/mL

## 2011-11-03 MED ORDER — DILTIAZEM HCL ER BEADS 420 MG PO CP24: 420.0000 mg | ORAL_CAPSULE | Freq: Every day | ORAL | Status: DC

## 2011-11-10 ENCOUNTER — Telehealth: Payer: Self-pay | Admitting: *Deleted

## 2011-11-10 NOTE — Telephone Encounter (Signed)
Pt called stating she was seen in clinic on Friday 9/20 and ROM test were done.  Since then she has had pain in rt hip.  It's getting worse. Describes pain as hot, burning feeling.  When sitting she is usually pain free, pain increase  with ambulation.  She has tried heat and ice with no relief. She has tried vicodin for pain ( even tried # 3 at once) .  She can barely walk to bathroom. Please advise. Pt # [id number]

## 2011-11-10 NOTE — Telephone Encounter (Signed)
Have her come in to be seen again. Thanks.

## 2011-11-10 NOTE — Telephone Encounter (Signed)
Pt scheduled for Thursday.

## 2011-11-12 ENCOUNTER — Ambulatory Visit (INDEPENDENT_AMBULATORY_CARE_PROVIDER_SITE_OTHER): Payer: Medicaid Other | Admitting: Internal Medicine

## 2011-11-12 VITALS — BP 129/76 | HR 77 | Temp 97.1°F | Wt 233.5 lb

## 2011-11-12 DIAGNOSIS — M7070 Other bursitis of hip, unspecified hip: Secondary | ICD-10-CM

## 2011-11-12 DIAGNOSIS — M76899 Other specified enthesopathies of unspecified lower limb, excluding foot: Secondary | ICD-10-CM

## 2011-11-12 MED ORDER — SALSALATE 750 MG PO TABS: 1500.0000 mg | ORAL_TABLET | Freq: Two times a day (BID) | ORAL | Status: DC

## 2011-11-12 NOTE — Progress Notes (Signed)
  Subjective:    Patient ID: Kelsey Garcia, female    DOB: 06-30-1945, 66 y.o.   MRN: [id number]  Hip Pain  The incident occurred more than 1 week ago. The incident occurred at home. There was no injury mechanism. The pain is present in the right hip. The quality of the pain is described as aching and shooting. The pain is at a severity of 10/10. The pain is severe. The pain has been intermittent since onset. Pertinent negatives include no inability to bear weight, loss of motion, loss of sensation, muscle weakness, numbness or tingling. She reports no foreign bodies present. The symptoms are aggravated by movement, palpation and weight bearing. She has tried rest, non-weight bearing and NSAIDs for the symptoms. The treatment provided mild relief.    Review of Systems  Musculoskeletal: Positive for back pain and arthralgias. Negative for joint swelling.  Neurological: Negative for tingling and numbness.      Objective:   Physical Exam  Nursing note and vitals reviewed. Constitutional: She is oriented to person, place, and time. She appears well-developed and well-nourished. No distress.  HENT:  Head: Normocephalic and atraumatic.  Eyes: No scleral icterus.  Neck: Neck supple.  Pulmonary/Chest: Effort normal. No respiratory distress.  Musculoskeletal: She exhibits tenderness. She exhibits no edema.       Right hip: She exhibits tenderness. She exhibits normal range of motion, normal strength, no bony tenderness, no swelling, no crepitus and no deformity.       Legs:      No pain with right hip abduction or external rotation, increased groin pain with right hip flexion against resistance.  Neurological: She is alert and oriented to person, place, and time.  Skin: Skin is dry. She is not diaphoretic. No erythema.  Psychiatric: She has a normal mood and affect. Her behavior is normal. Judgment and thought content normal.      Assessment & Plan:   Please see problem oriented charting.

## 2011-11-12 NOTE — Assessment & Plan Note (Signed)
Ms. Marovich likely has a right iliopsoas bursitis given the location of the tenderness, exacerbation of the pain with right hip flexion against resistance, intermittent nature of symptoms, and otherwise unremarkable right hip examination. The treatment of choice for iliopsoas bursitis is steroid injection of the bursa and Ms. Brienza was offered this therapy.  She began to cry when this was brought up and under no circumstances was she willing to undergo such intervention.  She has tried ibuprofen, naprosyn, etodolac, and miloxicam without much pain relief in the past but with significant stomach upset.  There is mild relief with hydrocodone and ASA.  She has not tried salsalate but given her response to ASA is interested in giving it a trial.  She was made aware that it is unlikely to be anywhere near as effective as a steroid injection but she stated this was her preferred therapy.  She was therefore prescribed salsalate 1500 mg PO BID with food.  She was asked to hold the aspirin therapy while taking the salsalate.

## 2011-11-12 NOTE — Progress Notes (Signed)
Subjective:     Patient ID: Kelsey Garcia, female   DOB: [birth year], 66 y.o.   MRN: [id number]  HPI Kelsey Garcia has a history of osteoarthritis and presents today with acutely worsening pain in her right hip. Since her last visit two weeks ago, she has been experiencing a burning, heating pain, worst over the lateral aspect of her R hip. She also has pain anteriorly, but not posteriorly. The pain is different from and more intense than her chronic arthritis pain, which is more typically over her posterior hip and affects both sides. She is relatively pain free in the morning, but has onset soon after she stands and walks. The pain persists all day and sometimes keeps her up at night. She has been taking 750 mg of aspirin at home with little relief, and has tried elevating her leg, sleeping with a pillow between her legs, as well as heat and ice. She takes hydrocodone-acetaminophen and has tried 2 and 3 tablets, but her hip pain did not improve. She says "I know it will pass" but requests medication temporarily to help with her pain. She expressed concern and anxiety about the prospect of a hip injection, because her mother had a bad experience with shoulder injections in the past. It was "the only time I saw her cry".  Review of Systems  Constitutional: Negative for fever and chills.  Respiratory: Negative for shortness of breath.   Cardiovascular: Negative for chest pain.  Gastrointestinal: Negative for abdominal pain.  Musculoskeletal: Positive for arthralgias and gait problem. Negative for joint swelling.       Objective:   Physical Exam  Constitutional: She appears well-developed and well-nourished.  HENT:  Head: Normocephalic and atraumatic.  Cardiovascular: Normal rate, regular rhythm and normal heart sounds.   Pulmonary/Chest: Effort normal and breath sounds normal.  Abdominal: Soft. There is no tenderness.  Musculoskeletal:       Right hip: She exhibits tenderness.       The right hip  was tender laterally over the trochanters as well as anteriorly. Pain was elicited by hip flexion against resistance. Abduction and external rotation were not painful.       Assessment:     1. R hip pain - Kelsey Garcia's hip pain is most consistent with iliopsoas bursitis, given the acute onset, distinction from chronic OA pain, and pain with hip flexion against resistance. Also possible is trochanteric bursitis, given her tenderness over the trochanters. However, pain was not worsened by hip abduction or external rotation.    Plan:     2. Kelsey Garcia refused a referral for possible bursae injections due to her concern about complications. Instead, she will stop her aspirin and begin salicylate 1500 mg twice daily for pain and and inflammation. She will return to clinic if her symptoms do not improve.

## 2011-11-12 NOTE — Patient Instructions (Addendum)
You most likely have a bursitis. Take salicylate 1500 mg twice daily with food. Stop taking your aspirin while you are taking the salicylate.

## 2011-11-24 ENCOUNTER — Other Ambulatory Visit: Payer: Self-pay | Admitting: *Deleted

## 2011-11-24 DIAGNOSIS — R05 Cough: Secondary | ICD-10-CM

## 2011-11-24 NOTE — Telephone Encounter (Signed)
Pt has refills on med she wanted to order

## 2011-11-26 DIAGNOSIS — Z7409 Other reduced mobility: Secondary | ICD-10-CM | POA: Insufficient documentation

## 2011-11-26 NOTE — Assessment & Plan Note (Signed)
She has a chronic cough.  She has been on the Hycodan for sometime and we discussed today that it is covering the cause of her cough rather then treating the underlying cause of the cough.  Differential diagnosis includes poorly controlled sinus disease, reflux, and her continued smoking.  We will add back her Protonix daily and we will refer her to ENT to consider a laryngeal scope to check for vocal cord dysfunction.  She has audible upper airway sounds today.  She was also very strongly advised to stop smoking completely as this will hopefully help with her shortness of breath as well as the chronic cough.  If that does not seem to work we could consider switching her antihistamine to chlorpheniramine to see if that helps.

## 2011-11-26 NOTE — Assessment & Plan Note (Signed)
Refilled her protonix today.

## 2011-11-26 NOTE — Assessment & Plan Note (Signed)
She continues to have problems with her right hip but the x-ray changes show only mild degenerative disease.  We will continue her chronic pain medication and continue to work to get her over to water aerobics which will help with ROM and strength which are contributing to her pain.

## 2011-11-26 NOTE — Assessment & Plan Note (Signed)
She continues to smoke which is likely contributing to her worsening shortness of breath. She has no productive cough and does not have any change in the color of her sputum so she is not having an acute exacerbation.  She has a chronic non-productive cough and has some scattered wheezes on exam today as well as audible upper airway sounds. She likely would benefit from pulmonology follow up as well as ENT to check for sinus disease or possible laryngeal dysfunction that would be contributing to her audible upper airway sounds.

## 2011-11-26 NOTE — Assessment & Plan Note (Signed)
Lab Results  Component Value Date   NA 140 06/22/2011   K 4.0 06/22/2011   CL 103 06/22/2011   CO2 31 06/22/2011   BUN 15 06/22/2011   CREATININE 0.76 06/22/2011   CREATININE 0.72 02/12/2011    BP Readings from Last 2 Encounters:  10/30/11 128/77  10/01/11 124/79    Assessment: Hypertension control:  controlled  Progress toward goals:  at goal Barriers to meeting goals:  no barriers identified  Plan: Hypertension treatment:  continue current medications

## 2011-11-26 NOTE — Assessment & Plan Note (Signed)
She has oral thrush on exam today.  We will prescribed Fluconazole and nystatin rinse for her.  She is encouraged to clean her dentures twice daily and continue to brush her gums with a soft bristled brush.

## 2011-11-26 NOTE — Assessment & Plan Note (Signed)
Drugs of Abuse     Component Value Date/Time   LABOPIA PPS 10/30/2011 1115   LABOPIA POSITIVE* 09/25/2010 0149   COCAINSCRNUR NEG 10/30/2011 1115   COCAINSCRNUR NONE DETECTED 09/25/2010 0149   LABBENZ PPS 10/30/2011 1115   LABBENZ NEG 10/03/2010 1153   LABBENZ NONE DETECTED 09/25/2010 0149   AMPHETMU NEG 10/03/2010 1153   AMPHETMU NONE DETECTED 09/25/2010 0149   THCU NONE DETECTED 09/25/2010 0149   LABBARB NEG 10/30/2011 1115   LABBARB NONE DETECTED 09/25/2010 0149    Her UDS is positive for opiates and benzos today which goes along with her prescribed medications.  She appears to be taking her diazepam as prescribed as well as her hydrocodone.  Interestingly she does not have any hydromorphone in her urine which is a metabolite of Hydrocodone.  This was discussed with Dr. Rogelia Boga and she noted that SSRIs can inhibit the enzyme that change hydrocodone to hydromorphone leaving only the hydrocodone and norhydrocodone as the metabolites noted in the urine.

## 2011-11-26 NOTE — Assessment & Plan Note (Signed)
The patient is limited in her ADLs secondary to her mobility.  She uses a walker but has still had several falls secondary to her balance.  She has to stop and rest after a short distance because of her severe COPD and dyspnea on exertion.  She is oxygen dependent.  She has trouble moving from room to room in her house as well as the ability to perform duties such as cooking and shopping as well as mobility to her office visits and pool therapy which has been suggested several times to help her hip pain and leg strength.  She has several falls over the last few months and because of her fear of falling spends most of her time in her bed or in her chair.  She becomes obviously dyspneic on ambulation over short distances and struggles with carrying her oxygen tank and managing her walker.  She states that her home would be too small to accommodate a scooter and is requesting a power wheelchair.  She likely would be come significantly short of breath propelling a manual wheelchair and has decreased ROM in her shoulders which would make a manual chair a poor choice.  She wants to get a power chair to help her get to the pool near her home to be able to do her water aerobics to help with strength and aerobic conditioning.

## 2011-12-07 ENCOUNTER — Other Ambulatory Visit: Payer: Self-pay | Admitting: *Deleted

## 2011-12-07 MED ORDER — POTASSIUM CHLORIDE CRYS ER 20 MEQ PO TBCR: 10.0000 meq | EXTENDED_RELEASE_TABLET | Freq: Two times a day (BID) | ORAL | Status: DC

## 2011-12-25 ENCOUNTER — Encounter: Payer: Self-pay | Admitting: Internal Medicine

## 2011-12-25 ENCOUNTER — Ambulatory Visit (INDEPENDENT_AMBULATORY_CARE_PROVIDER_SITE_OTHER): Payer: Medicare Other | Admitting: Internal Medicine

## 2011-12-25 VITALS — BP 118/72 | HR 90 | Temp 97.4°F | Ht 65.0 in | Wt 228.7 lb

## 2011-12-25 DIAGNOSIS — J449 Chronic obstructive pulmonary disease, unspecified: Secondary | ICD-10-CM

## 2011-12-25 DIAGNOSIS — I10 Essential (primary) hypertension: Secondary | ICD-10-CM

## 2011-12-25 DIAGNOSIS — R05 Cough: Secondary | ICD-10-CM

## 2011-12-25 DIAGNOSIS — Z1231 Encounter for screening mammogram for malignant neoplasm of breast: Secondary | ICD-10-CM

## 2011-12-25 DIAGNOSIS — Z1211 Encounter for screening for malignant neoplasm of colon: Secondary | ICD-10-CM

## 2011-12-25 MED ORDER — CHLORPHENIRAMINE MALEATE 4 MG PO TABS: 4.0000 mg | ORAL_TABLET | Freq: Two times a day (BID) | ORAL | Status: DC | PRN
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
## 2011-12-25 NOTE — Patient Instructions (Addendum)
1.  Stop the Claritin.  Start Chlorpheniramine 4 mg tablets.  Take 1 twice daily for the post nasal drip.  - If it does not work at all after about a week let me know.  2.  Continue working on quitting smoking.  Also continue the Protonix twice daily.  3.  We will send you home with the stool cards.  Fill them out and send them back.  If they come back positive we will have to proceed to the colonoscopy.  4.  We will work to get you set up for a mammogram.  I will call you if there is a problem.  5.  Follow up with me in about 2-3 months to see how you are doing, sooner if you need me.

## 2012-01-05 ENCOUNTER — Other Ambulatory Visit: Payer: Self-pay | Admitting: *Deleted

## 2012-01-05 DIAGNOSIS — M1990 Unspecified osteoarthritis, unspecified site: Secondary | ICD-10-CM

## 2012-01-05 DIAGNOSIS — M169 Osteoarthritis of hip, unspecified: Secondary | ICD-10-CM

## 2012-01-05 MED ORDER — HYDROCODONE-ACETAMINOPHEN 7.5-325 MG PO TABS: 1.0000 | ORAL_TABLET | Freq: Four times a day (QID) | ORAL | Status: DC | PRN

## 2012-01-05 NOTE — Telephone Encounter (Signed)
Check [place] narcotic database and she filled on 12/15/11 and has been getting it every 13-15 days prior to this request.  I will fill for now but when she comes for her follow up we will get a urine to ensure compliance.

## 2012-01-05 NOTE — Telephone Encounter (Signed)
Pt is out of pain refills.   Last refill 11/ 17  # 60 which is a 15 days supply.per pharmacy. So she is due for refill on 12/2.

## 2012-01-06 NOTE — Telephone Encounter (Signed)
Rx phoned in.   

## 2012-01-14 ENCOUNTER — Ambulatory Visit (HOSPITAL_COMMUNITY)
Admission: RE | Admit: 2012-01-14 | Discharge: 2012-01-14 | Disposition: A | Discharge status: Home / Self Care | Payer: Medicare Other | Source: Ambulatory Visit | Attending: Internal Medicine | Admitting: Internal Medicine

## 2012-01-14 DIAGNOSIS — Z1231 Encounter for screening mammogram for malignant neoplasm of breast: Secondary | ICD-10-CM

## 2012-01-28 ENCOUNTER — Encounter: Payer: Self-pay | Admitting: Licensed Clinical Social Worker

## 2012-01-28 NOTE — Progress Notes (Signed)
Patient ID: LAKESHA LEVINSON, female   DOB: 1945/04/10, 66 y.o.   MRN: [id number] Ms. Bieker placed call to CSW.  Pt states she has received housing through [employer].  However, Ms. Donahoe is in need of financial assistance for deposits, furniture and moving assistance.  CSW informed Ms. Bobeck, currently unaware of agencies that assist with deposits on disconnection notices.  Referred Ms. Lopresti to utilize [employer] and [employer] for referral to [employer].  Ms. Ledlow states [employer] also provides furniture to [place] residents with valid ID on Mondays 10am -12pm.  Pt has called [employer] for information on volunteer movers, non-available.  CSW encouraged Ms. Fiorentino to contact local universities and colleges for possible contacts to philanthropic groups/fraternties that may be willing to provide community service.  Pt thankful and denies add'l needs at this time.

## 2012-02-08 ENCOUNTER — Ambulatory Visit (INDEPENDENT_AMBULATORY_CARE_PROVIDER_SITE_OTHER): Payer: Medicare Other | Admitting: Internal Medicine

## 2012-02-08 ENCOUNTER — Encounter: Payer: Self-pay | Admitting: Internal Medicine

## 2012-02-08 VITALS — BP 128/78 | HR 92 | Temp 97.4°F | Ht 65.0 in | Wt 228.8 lb

## 2012-02-08 DIAGNOSIS — M169 Osteoarthritis of hip, unspecified: Secondary | ICD-10-CM

## 2012-02-08 DIAGNOSIS — I10 Essential (primary) hypertension: Secondary | ICD-10-CM

## 2012-02-08 DIAGNOSIS — F17200 Nicotine dependence, unspecified, uncomplicated: Secondary | ICD-10-CM

## 2012-02-08 DIAGNOSIS — J449 Chronic obstructive pulmonary disease, unspecified: Secondary | ICD-10-CM

## 2012-02-08 DIAGNOSIS — M1990 Unspecified osteoarthritis, unspecified site: Secondary | ICD-10-CM

## 2012-02-08 MED ORDER — HYDROCODONE-ACETAMINOPHEN 7.5-325 MG PO TABS: 1.0000 | ORAL_TABLET | Freq: Four times a day (QID) | ORAL | Status: DC | PRN

## 2012-02-08 NOTE — Patient Instructions (Signed)
1.  I will send a copy of the note to [facility] for the Certification for the home oxygen.    2.  Continue taking your medications as prescribed.  3.  Continue working on your strength!  You will continue to feel better as you do!  4.  Follow up with me in February.    Back Exercises These exercises may help you when beginning to rehabilitate your injury. Your symptoms may resolve with or without further involvement from your physician, physical therapist or athletic trainer. While completing these exercises, remember:   Restoring tissue flexibility helps normal motion to return to the joints. This allows healthier, less painful movement and activity.  An effective stretch should be held for at least 30 seconds.  A stretch should never be painful. You should only feel a gentle lengthening or release in the stretched tissue. STRETCH  Extension, Prone on Elbows   Lie on your stomach on the floor, a bed will be too soft. Place your palms about shoulder width apart and at the height of your head.  Place your elbows under your shoulders. If this is too painful, stack pillows under your chest.  Allow your body to relax so that your hips drop lower and make contact more completely with the floor.  Hold this position for __________ seconds.  Slowly return to lying flat on the floor. Repeat __________ times. Complete this exercise __________ times per day.  RANGE OF MOTION  Extension, Prone Press Ups   Lie on your stomach on the floor, a bed will be too soft. Place your palms about shoulder width apart and at the height of your head.  Keeping your back as relaxed as possible, slowly straighten your elbows while keeping your hips on the floor. You may adjust the placement of your hands to maximize your comfort. As you gain motion, your hands will come more underneath your shoulders.  Hold this position __________ seconds.  Slowly return to lying flat on the floor. Repeat __________ times.  Complete this exercise __________ times per day.  RANGE OF MOTION- Quadruped, Neutral Spine   Assume a hands and knees position on a firm surface. Keep your hands under your shoulders and your knees under your hips. You may place padding under your knees for comfort.  Drop your head and point your tail bone toward the ground below you. This will round out your low back like an angry cat. Hold this position for __________ seconds.  Slowly lift your head and release your tail bone so that your back sags into a large arch, like an old horse.  Hold this position for __________ seconds.  Repeat this until you feel limber in your low back.  Now, find your "sweet spot." This will be the most comfortable position somewhere between the two previous positions. This is your neutral spine. Once you have found this position, tense your stomach muscles to support your low back.  Hold this position for __________ seconds. Repeat __________ times. Complete this exercise __________ times per day.  STRETCH  Flexion, Single Knee to Chest   Lie on a firm bed or floor with both legs extended in front of you.  Keeping one leg in contact with the floor, bring your opposite knee to your chest. Hold your leg in place by either grabbing behind your thigh or at your knee.  Pull until you feel a gentle stretch in your low back. Hold __________ seconds.  Slowly release your grasp and repeat the  exercise with the opposite side. Repeat __________ times. Complete this exercise __________ times per day.  STRETCH - Hamstrings, Standing  Stand or sit and extend your right / left leg, placing your foot on a chair or foot stool  Keeping a slight arch in your low back and your hips straight forward.  Lead with your chest and lean forward at the waist until you feel a gentle stretch in the back of your right / left knee or thigh. (When done correctly, this exercise requires leaning only a small distance.)  Hold this  position for __________ seconds. Repeat __________ times. Complete this stretch __________ times per day. STRENGTHENING  Deep Abdominals, Pelvic Tilt   Lie on a firm bed or floor. Keeping your legs in front of you, bend your knees so they are both pointed toward the ceiling and your feet are flat on the floor.  Tense your lower abdominal muscles to press your low back into the floor. This motion will rotate your pelvis so that your tail bone is scooping upwards rather than pointing at your feet or into the floor.  With a gentle tension and even breathing, hold this position for __________ seconds. Repeat __________ times. Complete this exercise __________ times per day.  STRENGTHENING  Abdominals, Crunches   Lie on a firm bed or floor. Keeping your legs in front of you, bend your knees so they are both pointed toward the ceiling and your feet are flat on the floor. Cross your arms over your chest.  Slightly tip your chin down without bending your neck.  Tense your abdominals and slowly lift your trunk high enough to just clear your shoulder blades. Lifting higher can put excessive stress on the low back and does not further strengthen your abdominal muscles.  Control your return to the starting position. Repeat __________ times. Complete this exercise __________ times per day.  STRENGTHENING  Quadruped, Opposite UE/LE Lift   Assume a hands and knees position on a firm surface. Keep your hands under your shoulders and your knees under your hips. You may place padding under your knees for comfort.  Find your neutral spine and gently tense your abdominal muscles so that you can maintain this position. Your shoulders and hips should form a rectangle that is parallel with the floor and is not twisted.  Keeping your trunk steady, lift your right hand no higher than your shoulder and then your left leg no higher than your hip. Make sure you are not holding your breath. Hold this position  __________ seconds.  Continuing to keep your abdominal muscles tense and your back steady, slowly return to your starting position. Repeat with the opposite arm and leg. Repeat __________ times. Complete this exercise __________ times per day. Document Released: 02/13/2005 Document Revised: 04/20/2011 Document Reviewed: 05/10/2008 [facility] Patient Information 2013 [place], [place].
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
## 2012-03-08 ENCOUNTER — Other Ambulatory Visit: Payer: Self-pay | Admitting: *Deleted

## 2012-03-08 DIAGNOSIS — J449 Chronic obstructive pulmonary disease, unspecified: Secondary | ICD-10-CM

## 2012-03-08 MED ORDER — FUROSEMIDE 40 MG PO TABS: 40.0000 mg | ORAL_TABLET | Freq: Every day | ORAL | Status: DC

## 2012-03-08 MED ORDER — TIOTROPIUM BROMIDE MONOHYDRATE 18 MCG IN CAPS: 18.0000 ug | ORAL_CAPSULE | Freq: Every day | RESPIRATORY_TRACT | Status: DC

## 2012-03-15 ENCOUNTER — Encounter: Payer: Self-pay | Admitting: Licensed Clinical Social Worker

## 2012-03-17 ENCOUNTER — Other Ambulatory Visit: Payer: Self-pay | Admitting: *Deleted

## 2012-03-17 DIAGNOSIS — J449 Chronic obstructive pulmonary disease, unspecified: Secondary | ICD-10-CM

## 2012-03-18 MED ORDER — TIOTROPIUM BROMIDE MONOHYDRATE 18 MCG IN CAPS: 18.0000 ug | ORAL_CAPSULE | Freq: Every day | RESPIRATORY_TRACT | Status: DC

## 2012-04-08 ENCOUNTER — Encounter: Payer: Medicare Other | Admitting: Internal Medicine

## 2012-04-12 ENCOUNTER — Telehealth: Payer: Self-pay | Admitting: *Deleted

## 2012-04-12 MED ORDER — ALBUTEROL SULFATE HFA 108 (90 BASE) MCG/ACT IN AERS: 2.0000 | INHALATION_SPRAY | Freq: Four times a day (QID) | RESPIRATORY_TRACT | Status: DC | PRN

## 2012-04-12 NOTE — Telephone Encounter (Signed)
Insurance will not cover Ventolin HFA - it will cover ProAir. Needs new Rx. Audiological scientist. Stanton Kidney Cordarro Spinnato RN 04/12/12 10:30AM

## 2012-04-12 NOTE — Telephone Encounter (Signed)
The prescription is written for the generic so they can substitute whatever the insurance will cover.  I resent a new prescription in with the refill.

## 2012-04-25 ENCOUNTER — Ambulatory Visit (INDEPENDENT_AMBULATORY_CARE_PROVIDER_SITE_OTHER): Payer: Medicare Other | Admitting: Internal Medicine

## 2012-04-25 ENCOUNTER — Encounter: Payer: Self-pay | Admitting: Internal Medicine

## 2012-04-25 VITALS — BP 119/62 | HR 77 | Temp 97.6°F | Ht 65.5 in | Wt 217.1 lb

## 2012-04-25 DIAGNOSIS — F329 Major depressive disorder, single episode, unspecified: Secondary | ICD-10-CM

## 2012-04-25 DIAGNOSIS — J449 Chronic obstructive pulmonary disease, unspecified: Secondary | ICD-10-CM

## 2012-04-25 DIAGNOSIS — I10 Essential (primary) hypertension: Secondary | ICD-10-CM

## 2012-04-25 DIAGNOSIS — J309 Allergic rhinitis, unspecified: Secondary | ICD-10-CM

## 2012-04-25 DIAGNOSIS — R05 Cough: Secondary | ICD-10-CM

## 2012-04-25 MED ORDER — CETIRIZINE HCL 10 MG PO TABS: 10.0000 mg | ORAL_TABLET | Freq: Every day | ORAL | Status: DC

## 2012-04-25 MED ORDER — DILTIAZEM HCL ER BEADS 420 MG PO CP24: 420.0000 mg | ORAL_CAPSULE | Freq: Every day | ORAL | Status: DC

## 2012-04-25 MED ORDER — MONTELUKAST SODIUM 10 MG PO TABS: 10.0000 mg | ORAL_TABLET | Freq: Every day | ORAL | Status: DC

## 2012-04-25 MED ORDER — HYDROCODONE-HOMATROPINE 5-1.5 MG/5ML PO SYRP: 5.0000 mL | ORAL_SOLUTION | Freq: Three times a day (TID) | ORAL | Status: DC | PRN

## 2012-04-25 MED ORDER — FLUOXETINE HCL 20 MG PO CAPS: 60.0000 mg | ORAL_CAPSULE | Freq: Every day | ORAL | Status: DC

## 2012-04-25 NOTE — Patient Instructions (Addendum)
1.  Continue your Zyrtec as directed on the bottle.    2.  Start Singular 10 mg daily for your allergies.    3.  Continue with your other medications as prescribed   4.  Follow up with me in May.
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
## 2012-04-26 NOTE — Assessment & Plan Note (Addendum)
Her symptoms are consistent with allergic rhinitis and we will treat with anti-histamines.  Zyrtec OTC is fine and we discussed chlorpheniramine also.  She will continue with the Zyrtec.  She has been on Singular in the past and it is reasonable as well so we will start that as well. She has had recent nose bleeds from nasal steroids so we will hold those for now.

## 2012-04-26 NOTE — Assessment & Plan Note (Signed)
BP Readings from Last 3 Encounters:  04/25/12 119/62  02/08/12 128/78  12/25/11 118/72    Lab Results  Component Value Date   NA 140 06/22/2011   K 4.0 06/22/2011   CREATININE 0.76 06/22/2011    Assessment:  Blood pressure control: controlled  Progress toward BP goal:  at goal  Comments: BP has been well controlled.   Plan:  Medications:  continue current medications  Educational resources provided: brochure;handout;video  Self management tools provided:    Other plans: Encouraged to continue working toward total tobacco cessation.

## 2012-04-26 NOTE — Assessment & Plan Note (Signed)
She states that she is doing well from her breathing standpoint and has been taking her medications as directed.  She denies any increased SOB, increased sputum production, or change in the color of her sputum.  She continues to smoke occasionally but usually no more then 1 cigarette per week average.    She was counseled today that complete cessation from smoking and continuing to take her medications as directed are the number 1 things she can do to slow the progression of her COPD.  We discussed warning signs and when to call us.  For now we will continue her medications as prescribed and continue to follow.

## 2012-04-26 NOTE — Assessment & Plan Note (Signed)
She states that her chronic cough is doing well.  She was seen by Dr. Lazarus Salines at [facility] who increased her protonix to BID and told her to continue the nasal saline checks.  Dr. Lazarus Salines also stated that it was fine to continue her Hycodan cough medication.   We will refill her hycodan today per her contract. [place] narcotic database was run and showed she is appropriately filling her medications.

## 2012-04-26 NOTE — Assessment & Plan Note (Signed)
She is followed by [facility] for her mental health.  She states that at the last visit there they started to wean her off the abilify and continued the prozac and valium. They are prescribing these medications for her except the Prozac which we are doing.  She states that her mood is doing well and she feels she is well controlled.  She denies SI, HI, decreased concentration, increased anxiety, or problems sleeping.   We will continue her medications as prescribed and refill her prozac today.  She is encouraged to continue following up with [facility] for her mental health.

## 2012-05-10 ENCOUNTER — Other Ambulatory Visit: Payer: Self-pay | Admitting: *Deleted

## 2012-05-10 NOTE — Telephone Encounter (Signed)
Review, ditiazem filled 3/17- dr Tonny Branch

## 2012-05-16 ENCOUNTER — Telehealth: Payer: Self-pay | Admitting: *Deleted

## 2012-05-16 NOTE — Telephone Encounter (Signed)
Pt called asking for letter from Dr Tonny Branch  to Jeffie Pollock, apartment manager,  stating she needs a ramp put on her apartment so she can get in and out.  There is a drop at present.  Should state this is a reasonable accomodation. Address is  [address]   [place], [id number]  I will forward this to [place], CSW for assistance. Pt # [phone]

## 2012-05-17 ENCOUNTER — Encounter: Payer: Self-pay | Admitting: Licensed Clinical Social Worker

## 2012-05-17 NOTE — Telephone Encounter (Signed)
Letter is in [place].  Please review, sign and place in my mailbox.

## 2012-05-17 NOTE — Telephone Encounter (Signed)
I can draft a letter and follow up with [name].  However, the landlord is not mandated to comply with the request.

## 2012-05-17 NOTE — Telephone Encounter (Signed)
Let me know when you have it drafted and I can come sign it if you would like me too.

## 2012-06-10 ENCOUNTER — Other Ambulatory Visit: Payer: Self-pay | Admitting: *Deleted

## 2012-06-10 MED ORDER — BUDESONIDE-FORMOTEROL FUMARATE 160-4.5 MCG/ACT IN AERO: 2.0000 | INHALATION_SPRAY | Freq: Two times a day (BID) | RESPIRATORY_TRACT | Status: DC

## 2012-06-15 ENCOUNTER — Telehealth: Payer: Self-pay | Admitting: *Deleted

## 2012-06-15 DIAGNOSIS — J449 Chronic obstructive pulmonary disease, unspecified: Secondary | ICD-10-CM

## 2012-06-15 NOTE — Telephone Encounter (Signed)
Fax from [place] [facility] - states Medicaid will only pay for ProAir not Albuterol(Proventil) - can u change to ProAir. Need new rx  Thanks

## 2012-06-16 ENCOUNTER — Other Ambulatory Visit: Payer: Self-pay | Admitting: *Deleted

## 2012-06-16 MED ORDER — ALBUTEROL SULFATE HFA 108 (90 BASE) MCG/ACT IN AERS: 2.0000 | INHALATION_SPRAY | Freq: Four times a day (QID) | RESPIRATORY_TRACT | Status: DC | PRN

## 2012-06-16 NOTE — Telephone Encounter (Signed)
We don't prescribe this for her.  It has come from [facility] for the last year.  Elita Quick MSN FNP was the prescriber in the last year.

## 2012-06-16 NOTE — Telephone Encounter (Signed)
Sent!

## 2012-06-16 NOTE — Telephone Encounter (Signed)
[facility] has pt taking One tablet by mouth twice daily; qty# 60.

## 2012-06-16 NOTE — Telephone Encounter (Signed)
[facility] made awared of refusal.

## 2012-06-17 ENCOUNTER — Ambulatory Visit (INDEPENDENT_AMBULATORY_CARE_PROVIDER_SITE_OTHER): Payer: Medicare Other | Admitting: Internal Medicine

## 2012-06-17 ENCOUNTER — Encounter: Payer: Self-pay | Admitting: Internal Medicine

## 2012-06-17 VITALS — BP 106/63 | HR 68 | Temp 97.2°F | Ht 63.5 in | Wt 216.5 lb

## 2012-06-17 DIAGNOSIS — I10 Essential (primary) hypertension: Secondary | ICD-10-CM

## 2012-06-17 DIAGNOSIS — F411 Generalized anxiety disorder: Secondary | ICD-10-CM

## 2012-06-17 DIAGNOSIS — F419 Anxiety disorder, unspecified: Secondary | ICD-10-CM

## 2012-06-17 DIAGNOSIS — J449 Chronic obstructive pulmonary disease, unspecified: Secondary | ICD-10-CM

## 2012-06-17 DIAGNOSIS — R05 Cough: Secondary | ICD-10-CM

## 2012-06-17 DIAGNOSIS — J309 Allergic rhinitis, unspecified: Secondary | ICD-10-CM

## 2012-06-17 MED ORDER — DIAZEPAM 2 MG PO TABS: 2.0000 mg | ORAL_TABLET | Freq: Two times a day (BID) | ORAL | Status: DC | PRN
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
## 2012-06-17 NOTE — Patient Instructions (Signed)
1.  Congratulations on cutting out your smoking!  2.  Continue your medications as prescribed.  3.  We will work to get you over to Pulmonary rehab to help with your activity levels.  4.  Follow up in July and August to meet your new primary care doctor.  5.  It has been an absolute pleasure caring for your over the last few years.  I wish you all the best in the future!

## 2012-06-22 NOTE — Assessment & Plan Note (Signed)
She has generalized anxiety disorder and has been managed in the past by [facility].  She states that she was able to titrate off of her abilify that they had her on and is now only taking the Valium 2 mg BID PRN and her prozac.  She would like to not have to go back to [place] so we will take over the prescribing of her Valium since we are already doing the Prozac.  We discussed that she should also consider counseling as a long term help with her anxiety and she will consider it.

## 2012-06-22 NOTE — Assessment & Plan Note (Signed)
She continues to suffer with a  Chronic non-productive cough.  She states that it is bad currently because of the start of allergy season which she is managing with Singular and Zyrtec.  She has also been taking her protonix.  She continues to use the Hycodan as needed and has no red flags on review of her  narcotic database and her last UDS was appropriate.

## 2012-06-22 NOTE — Assessment & Plan Note (Signed)
Her breathing is at baseline and she states that she is not sort of breath at rest though does get short of breath with minimal activity.  She has never gone through pulmonary rehabilitation and is interested to work on increasing her ability for activity without breathlessness. We will refer her over and get her started.

## 2012-06-22 NOTE — Assessment & Plan Note (Signed)
She is currently suffering with nasal allergies from the start of the pollen season here in [place].  She has had some increased cough but no increased sputum production, change in color of her sputum, or worsening SOB.  I encouraged her to continue to use the Zyrtec and Singular as well as nasal saline as tolerated.  She has been unable to use the nasal steroid because of nose bleeds recently.

## 2012-06-22 NOTE — Assessment & Plan Note (Signed)
She states that she is taking her medications as prescribed and denies any side effects from the medications.    BP Readings from Last 3 Encounters:  06/17/12 106/63  04/25/12 119/62  02/08/12 128/78    Lab Results  Component Value Date   NA 140 06/22/2011   K 4.0 06/22/2011   CREATININE 0.76 06/22/2011    Assessment: Blood pressure control: controlled Progress toward BP goal:  at goal Comments: Blood pressure well controlled on her current regimen of Lasix 40 mg qd and Diltiazem 420 mg qd  Plan: Medications:  continue current medications Educational resources provided: brochure;handout;video Self management tools provided:   Other plans: She states that she is only smoking 1-2 cigarettes weekly.  I encouraged her to quit completely and she states that she is working on not needing the crutch.

## 2012-06-23 NOTE — Progress Notes (Signed)
INTERNAL MEDICINE TEACHING ATTENDING ADDENDUM: I discussed this case with Dr. Pribula soon after the patient visit. I have read the documentation and I agree with the plan of care. Please see the resident note for details of management.    

## 2012-07-01 ENCOUNTER — Encounter: Payer: Self-pay | Admitting: Internal Medicine

## 2012-07-05 NOTE — Progress Notes (Signed)
Info faxed to [facility] for . [facility]ney Shrihaan Porzio RN 07/05/12 11AM

## 2012-07-12 ENCOUNTER — Telehealth: Payer: Self-pay | Admitting: *Deleted

## 2012-07-12 NOTE — Telephone Encounter (Signed)
Pt calls and states she has "been watching it for about 5 days", states she has bil pedal edema, very "tight" cough and shortness of breath. SHE REFUSES ED OR URG CARE OR 911. She is given appt 6/4 at 1445 dr patel. She is ask once again to come to ED and refuses, she does state if it gets worse tonight she will call 911.

## 2012-07-12 NOTE — Telephone Encounter (Signed)
Agree with evaluation in the ED vs clinic appointment.

## 2012-07-13 ENCOUNTER — Ambulatory Visit (HOSPITAL_COMMUNITY): Admit: 2012-07-13 | Discharge: 2012-07-13 | Disposition: A | Discharge status: Home / Self Care | Payer: Medicare Other

## 2012-07-13 ENCOUNTER — Encounter: Payer: Self-pay | Admitting: Internal Medicine

## 2012-07-13 ENCOUNTER — Ambulatory Visit (INDEPENDENT_AMBULATORY_CARE_PROVIDER_SITE_OTHER): Payer: Medicare Other | Admitting: Internal Medicine

## 2012-07-13 ENCOUNTER — Ambulatory Visit (HOSPITAL_COMMUNITY)
Admission: RE | Admit: 2012-07-13 | Discharge: 2012-07-13 | Disposition: A | Discharge status: Home / Self Care | Payer: Medicare Other | Source: Ambulatory Visit | Attending: Internal Medicine | Admitting: Internal Medicine

## 2012-07-13 VITALS — BP 115/66 | HR 75 | Temp 97.7°F | Ht 63.5 in | Wt 226.7 lb

## 2012-07-13 DIAGNOSIS — R0602 Shortness of breath: Secondary | ICD-10-CM | POA: Insufficient documentation

## 2012-07-13 DIAGNOSIS — R609 Edema, unspecified: Secondary | ICD-10-CM

## 2012-07-13 DIAGNOSIS — R600 Localized edema: Secondary | ICD-10-CM

## 2012-07-13 LAB — PRO B NATRIURETIC PEPTIDE: Pro B Natriuretic peptide (BNP): 857.6 pg/mL — ABNORMAL HIGH (ref <126)

## 2012-07-13 LAB — COMPLETE METABOLIC PANEL WITH GFR
Alkaline Phosphatase: 104 U/L (ref 39–117)
BUN: 18 mg/dL (ref 6–23)
CO2: 30 mEq/L (ref 19–32)
Creat: 0.9 mg/dL (ref 0.50–1.10)
GFR, Est African American: 78 mL/min
GFR, Est Non African American: 67 mL/min
Glucose, Bld: 90 mg/dL (ref 70–99)
Sodium: 140 mEq/L (ref 135–145)
Total Bilirubin: 0.3 mg/dL (ref 0.3–1.2)

## 2012-07-13 LAB — CBC
HCT: 38.7 % (ref 36.0–46.0)
Hemoglobin: 12.5 g/dL (ref 12.0–15.0)
MCH: 32 pg (ref 26.0–34.0)
MCHC: 32.3 g/dL (ref 30.0–36.0)
MCV: 99 fL (ref 78.0–100.0)
RBC: 3.91 MIL/uL (ref 3.87–5.11)

## 2012-07-13 NOTE — Progress Notes (Signed)
Per request of [facility] MD, labs drawn in clinic reviewed.  CBC reveals mildly elevated WBC, but normal hb & platelets. CMP wnl. pBNP mildly elevated, and lasix was increased during today's clinic visit (Lasx 40 BID) Negative troponin.

## 2012-07-13 NOTE — Progress Notes (Signed)
  Subjective:    Patient ID: Kelsey Garcia, female    DOB: October 14, 1945, 67 y.o.   MRN: [id number]  HPI patient is a 42 woman hyperlipidemia, OCD, hypertension, history of A. fib and cardiomyopathy, COPD and other problems as per problem list who comes to the clinic for bilateral leg edema and shortness of breath for past 5 days.  She reports new onset worsening leg edema for past 5 days. She reports 10 pound weight gain in less than a month which is documented.  She also has exertional shortness of breath even with couple of steps, But no PND or orthopnea. She denies any chest pain, nausea, vomiting, headache, palpitations, vision changes. She also does not have any diarrhea, abdominal pain, constipation.  She uses home oxygen- 2 L for COPD and does not have increased needs. Oxygen saturation is 95% on 2 L. Does not have any worsening cough, fever, chills.   she has had leg swelling in past for which she was admitted in hospital and given Lasix per patient. Her last 2-D echo was done in 2010 which showed EF of 60-65%.  Review of Systems     as per history of present illness  Objective:   Physical Exam  General: NAD, on home O2. HEENT: PERRL, EOMI, no scleral icterus Cardiac: S1, S2, RRR, no rubs, murmurs or gallops Pulm: Distant breath sounds, moving normal volumes of air, no wheezing. Bilateral diffuse mild crackles. Abd: soft, nontender, nondistended, BS present Ext: warm and well perfused, 2+ pedal edema bilaterally up to the knees. Neuro: alert and oriented X3, cranial nerves II-XII grossly intact       Assessment & Plan:

## 2012-07-13 NOTE — Patient Instructions (Signed)
Please make a followup appointment in 4-6 days.  Will call you with the blood test results- and see if there is any other changes to be made.  - Meanwhile increase the dose of Lasix to 40 mg twice daily until you come to the clinic for next visit  - Keep taking all the medications regularly as you do.

## 2012-07-13 NOTE — Assessment & Plan Note (Signed)
Unclear etiology. Likely cardiac etiology considering her history of A. fib and cardiomyopathy, although liver function abnormalities cannot be ruled out- as patient has low albumin up to 2.8 in past. Renal etiology also on differential. DVT of leg the considering bilateral and no recent immobilization.  - As per shortness of breath.

## 2012-07-13 NOTE — Assessment & Plan Note (Addendum)
Unclear etiology. Clinically more anginal symptoms rather than heart failure symptoms. - Although patient does not have any chest pain, nausea vomiting, diaphoresis.  - Stat CMP, CBC, troponin, 12-lead EKG, 2 view chest x-ray. -  EKG showed no ST, T-wave or Q-wave changes concerning for ischemia or infarction.  - Chest x-ray showed stable cardiomegaly but no acute process. - CBC showed WBC 13.7 -unclear clinical significance. Likely stress-related. - Pending troponin and CMP. Discussed with resident on call and asked to follow up on remaining labs and call patient if needed for further management.  - Increased the dose of Lasix to 40 mg twice daily. - See her back in clinic in 4-6 days and earlier if any of the lab tests are abnormal.

## 2012-07-14 ENCOUNTER — Telehealth: Payer: Self-pay | Admitting: *Deleted

## 2012-07-14 ENCOUNTER — Other Ambulatory Visit: Payer: Self-pay

## 2012-07-14 ENCOUNTER — Inpatient Hospital Stay (HOSPITAL_COMMUNITY)
Admission: EM | Admit: 2012-07-14 | Discharge: 2012-07-17 | DRG: 292 | Disposition: A | Discharge status: Home / Self Care | Payer: Medicare Other | Attending: Internal Medicine | Admitting: Internal Medicine

## 2012-07-14 ENCOUNTER — Emergency Department (HOSPITAL_COMMUNITY): Payer: Medicare Other

## 2012-07-14 ENCOUNTER — Encounter (HOSPITAL_COMMUNITY): Payer: Self-pay | Admitting: Emergency Medicine

## 2012-07-14 DIAGNOSIS — I5023 Acute on chronic systolic (congestive) heart failure: Secondary | ICD-10-CM

## 2012-07-14 DIAGNOSIS — I10 Essential (primary) hypertension: Secondary | ICD-10-CM | POA: Diagnosis present

## 2012-07-14 DIAGNOSIS — J441 Chronic obstructive pulmonary disease with (acute) exacerbation: Secondary | ICD-10-CM | POA: Diagnosis present

## 2012-07-14 DIAGNOSIS — E785 Hyperlipidemia, unspecified: Secondary | ICD-10-CM | POA: Diagnosis present

## 2012-07-14 DIAGNOSIS — G4700 Insomnia, unspecified: Secondary | ICD-10-CM | POA: Diagnosis present

## 2012-07-14 DIAGNOSIS — I5033 Acute on chronic diastolic (congestive) heart failure: Principal | ICD-10-CM | POA: Diagnosis present

## 2012-07-14 DIAGNOSIS — K219 Gastro-esophageal reflux disease without esophagitis: Secondary | ICD-10-CM | POA: Diagnosis present

## 2012-07-14 DIAGNOSIS — F411 Generalized anxiety disorder: Secondary | ICD-10-CM | POA: Diagnosis present

## 2012-07-14 DIAGNOSIS — I509 Heart failure, unspecified: Secondary | ICD-10-CM | POA: Diagnosis present

## 2012-07-14 DIAGNOSIS — I428 Other cardiomyopathies: Secondary | ICD-10-CM | POA: Diagnosis present

## 2012-07-14 DIAGNOSIS — F17200 Nicotine dependence, unspecified, uncomplicated: Secondary | ICD-10-CM | POA: Diagnosis present

## 2012-07-14 DIAGNOSIS — J309 Allergic rhinitis, unspecified: Secondary | ICD-10-CM | POA: Diagnosis present

## 2012-07-14 DIAGNOSIS — F3289 Other specified depressive episodes: Secondary | ICD-10-CM | POA: Diagnosis present

## 2012-07-14 DIAGNOSIS — Z79899 Other long term (current) drug therapy: Secondary | ICD-10-CM

## 2012-07-14 DIAGNOSIS — I4891 Unspecified atrial fibrillation: Secondary | ICD-10-CM | POA: Diagnosis present

## 2012-07-14 DIAGNOSIS — Z7982 Long term (current) use of aspirin: Secondary | ICD-10-CM

## 2012-07-14 DIAGNOSIS — F329 Major depressive disorder, single episode, unspecified: Secondary | ICD-10-CM | POA: Diagnosis present

## 2012-07-14 DIAGNOSIS — J449 Chronic obstructive pulmonary disease, unspecified: Secondary | ICD-10-CM | POA: Diagnosis present

## 2012-07-14 DIAGNOSIS — I059 Rheumatic mitral valve disease, unspecified: Secondary | ICD-10-CM | POA: Diagnosis present

## 2012-07-14 DIAGNOSIS — D72829 Elevated white blood cell count, unspecified: Secondary | ICD-10-CM | POA: Diagnosis present

## 2012-07-14 HISTORY — DX: Heart failure, unspecified: I50.9

## 2012-07-14 HISTORY — DX: Gastro-esophageal reflux disease without esophagitis: K21.9

## 2012-07-14 HISTORY — DX: Pneumonia, unspecified organism: J18.9

## 2012-07-14 HISTORY — DX: Chronic obstructive pulmonary disease, unspecified: J44.9

## 2012-07-14 HISTORY — DX: Other specified chronic obstructive pulmonary disease: J44.89

## 2012-07-14 HISTORY — DX: Unspecified osteoarthritis, unspecified site: M19.90

## 2012-07-14 HISTORY — DX: Migraine, unspecified, not intractable, without status migrainosus: G43.909

## 2012-07-14 LAB — CBC
HCT: 39.5 % (ref 36.0–46.0)
MCV: 99.2 fL (ref 78.0–100.0)
Platelets: 251 10*3/uL (ref 150–400)
RBC: 3.98 MIL/uL (ref 3.87–5.11)
WBC: 12 10*3/uL — ABNORMAL HIGH (ref 4.0–10.5)

## 2012-07-14 LAB — BASIC METABOLIC PANEL
CO2: 32 mEq/L (ref 19–32)
Chloride: 101 mEq/L (ref 96–112)
Creatinine, Ser: 0.99 mg/dL (ref 0.50–1.10)
GFR calc Af Amer: 68 mL/min — ABNORMAL LOW (ref >90)
Potassium: 3.5 mEq/L (ref 3.5–5.1)

## 2012-07-14 LAB — PRO B NATRIURETIC PEPTIDE: Pro B Natriuretic peptide (BNP): 1190 pg/mL — ABNORMAL HIGH (ref 0–125)

## 2012-07-14 LAB — POCT I-STAT TROPONIN I

## 2012-07-14 MED ORDER — ASPIRIN 325 MG PO TABS
650.0000 mg | ORAL_TABLET | Freq: Every day | ORAL | Status: DC
  Administered 2012-07-15 – 2012-07-17 (×3): 650 mg via ORAL
  Filled 2012-07-14 (×3): qty 2

## 2012-07-14 MED ORDER — FUROSEMIDE 10 MG/ML IJ SOLN
40.0000 mg | Freq: Two times a day (BID) | INTRAMUSCULAR | Status: DC
  Administered 2012-07-14 – 2012-07-16 (×4): 40 mg via INTRAVENOUS
  Filled 2012-07-14 (×6): qty 4

## 2012-07-14 MED ORDER — DILTIAZEM HCL ER BEADS 300 MG PO CP24: 420.0000 mg | ORAL_CAPSULE | Freq: Every day | ORAL | Status: DC

## 2012-07-14 MED ORDER — ALBUTEROL SULFATE (5 MG/ML) 0.5% IN NEBU
2.5000 mg | INHALATION_SOLUTION | RESPIRATORY_TRACT | Status: DC
  Administered 2012-07-14: 2.5 mg via RESPIRATORY_TRACT
  Filled 2012-07-14: qty 0.5

## 2012-07-14 MED ORDER — SODIUM CHLORIDE 0.9 % IV SOLN: 250.0000 mL | INTRAVENOUS | Status: DC | PRN

## 2012-07-14 MED ORDER — MONTELUKAST SODIUM 10 MG PO TABS
10.0000 mg | ORAL_TABLET | Freq: Every day | ORAL | Status: DC
  Administered 2012-07-14 – 2012-07-16 (×3): 10 mg via ORAL
  Filled 2012-07-14 (×4): qty 1

## 2012-07-14 MED ORDER — DILTIAZEM HCL ER COATED BEADS 300 MG PO CP24
420.0000 mg | ORAL_CAPSULE | Freq: Every day | ORAL | Status: DC
  Administered 2012-07-15 – 2012-07-17 (×3): 420 mg via ORAL
  Filled 2012-07-14 (×3): qty 1

## 2012-07-14 MED ORDER — POTASSIUM CHLORIDE CRYS ER 20 MEQ PO TBCR
20.0000 meq | EXTENDED_RELEASE_TABLET | Freq: Two times a day (BID) | ORAL | Status: AC
  Administered 2012-07-14 – 2012-07-15 (×2): 20 meq via ORAL
  Filled 2012-07-14 (×2): qty 1

## 2012-07-14 MED ORDER — HEPARIN SODIUM (PORCINE) 5000 UNIT/ML IJ SOLN
5000.0000 [IU] | Freq: Three times a day (TID) | INTRAMUSCULAR | Status: DC
  Administered 2012-07-14 – 2012-07-17 (×8): 5000 [IU] via SUBCUTANEOUS
  Filled 2012-07-14 (×11): qty 1

## 2012-07-14 MED ORDER — SODIUM CHLORIDE 0.9 % IJ SOLN
3.0000 mL | Freq: Two times a day (BID) | INTRAMUSCULAR | Status: DC
  Administered 2012-07-16: 3 mL via INTRAVENOUS

## 2012-07-14 MED ORDER — HYDROCODONE-ACETAMINOPHEN 7.5-325 MG PO TABS
1.0000 | ORAL_TABLET | Freq: Four times a day (QID) | ORAL | Status: DC | PRN
  Administered 2012-07-14 – 2012-07-17 (×6): 1 via ORAL
  Filled 2012-07-14 (×6): qty 1

## 2012-07-14 MED ORDER — SODIUM CHLORIDE 0.9 % IJ SOLN: 3.0000 mL | INTRAMUSCULAR | Status: DC | PRN

## 2012-07-14 MED ORDER — LORATADINE 10 MG PO TABS
10.0000 mg | ORAL_TABLET | Freq: Every day | ORAL | Status: DC
  Administered 2012-07-15 – 2012-07-17 (×3): 10 mg via ORAL
  Filled 2012-07-14 (×3): qty 1

## 2012-07-14 MED ORDER — PANTOPRAZOLE SODIUM 40 MG PO TBEC
40.0000 mg | DELAYED_RELEASE_TABLET | Freq: Every day | ORAL | Status: DC
  Administered 2012-07-15 – 2012-07-17 (×3): 40 mg via ORAL
  Filled 2012-07-14 (×3): qty 1

## 2012-07-14 MED ORDER — IPRATROPIUM BROMIDE 0.02 % IN SOLN
0.5000 mg | RESPIRATORY_TRACT | Status: DC
  Filled 2012-07-14: qty 2.5

## 2012-07-14 MED ORDER — DIAZEPAM 2 MG PO TABS
2.0000 mg | ORAL_TABLET | Freq: Two times a day (BID) | ORAL | Status: DC | PRN
  Administered 2012-07-14: 2 mg via ORAL
  Filled 2012-07-14: qty 1

## 2012-07-14 MED ORDER — ALBUTEROL SULFATE (5 MG/ML) 0.5% IN NEBU
2.5000 mg | INHALATION_SOLUTION | RESPIRATORY_TRACT | Status: DC | PRN
Start: 2012-07-14 — End: 2012-07-17

## 2012-07-14 MED ORDER — TIOTROPIUM BROMIDE MONOHYDRATE 18 MCG IN CAPS
18.0000 ug | ORAL_CAPSULE | Freq: Every day | RESPIRATORY_TRACT | Status: DC
  Administered 2012-07-14 – 2012-07-15 (×2): 18 ug via RESPIRATORY_TRACT
  Filled 2012-07-14: qty 5

## 2012-07-14 MED ORDER — BUDESONIDE-FORMOTEROL FUMARATE 160-4.5 MCG/ACT IN AERO
2.0000 | INHALATION_SPRAY | Freq: Two times a day (BID) | RESPIRATORY_TRACT | Status: DC
  Administered 2012-07-14 – 2012-07-16 (×4): 2 via RESPIRATORY_TRACT
  Filled 2012-07-14: qty 6

## 2012-07-14 MED ORDER — HYDROCODONE-HOMATROPINE 5-1.5 MG/5ML PO SYRP: 5.0000 mL | ORAL_SOLUTION | Freq: Three times a day (TID) | ORAL | Status: DC | PRN

## 2012-07-14 MED ORDER — FLUOXETINE HCL 20 MG PO CAPS
60.0000 mg | ORAL_CAPSULE | Freq: Every day | ORAL | Status: DC
  Administered 2012-07-15 – 2012-07-17 (×3): 60 mg via ORAL
  Filled 2012-07-14 (×3): qty 3

## 2012-07-14 MED ORDER — DOXYCYCLINE HYCLATE 100 MG PO TABS
100.0000 mg | ORAL_TABLET | Freq: Two times a day (BID) | ORAL | Status: DC
  Filled 2012-07-14: qty 1

## 2012-07-14 MED ORDER — PREDNISONE 20 MG PO TABS
40.0000 mg | ORAL_TABLET | Freq: Every day | ORAL | Status: DC
  Filled 2012-07-14 (×2): qty 2

## 2012-07-14 MED ORDER — SODIUM CHLORIDE 0.9 % IJ SOLN
3.0000 mL | Freq: Two times a day (BID) | INTRAMUSCULAR | Status: DC
  Administered 2012-07-14 – 2012-07-16 (×5): 3 mL via INTRAVENOUS

## 2012-07-14 MED ORDER — BUDESONIDE-FORMOTEROL FUMARATE 160-4.5 MCG/ACT IN AERO: 2.0000 | INHALATION_SPRAY | Freq: Once | RESPIRATORY_TRACT | Status: DC

## 2012-07-14 NOTE — Telephone Encounter (Signed)
Pt called asking for an appointment for Monday. She still feels SOB with any activity.  Her BNP was 857 on 6/4 I talked with Dr Dalphine Handing and she recommends ED for this pt. Pt called and will go to ED now for evaluation.

## 2012-07-14 NOTE — ED Notes (Signed)
Pt was seen at clinic at [facility] yesterday and called today to come to ed for further evaluation.

## 2012-07-14 NOTE — ED Provider Notes (Signed)
History     CSN: [id number]  Arrival date & time 07/14/12  1420   First MD Initiated Contact with Patient 07/14/12 1503      Chief Complaint  Patient presents with  . Shortness of Breath    (Consider location/radiation/quality/duration/timing/severity/associated sxs/prior treatment) Patient is a 67 y.o. female presenting with shortness of breath.  Shortness of Breath Severity:  Moderate Onset quality:  Gradual Duration:  6 days Timing:  Constant Progression:  Worsening Chronicity:  Recurrent Context: activity   Relieved by:  Nothing Worsened by:  Activity Ineffective treatments:  Diuretics (dose of lasix recently increased to 40mg  BID) Associated symptoms: cough, PND and sputum production (yellow)   Associated symptoms: no abdominal pain, no chest pain, no claudication, no fever, no sore throat and no vomiting     Past Medical History  Diagnosis Date  . Atrial fibrillation   . Hypertension   . Insomnia   . OCD (obsessive compulsive disorder)   . Nonischemic cardiomyopathy   . Hyperlipidemia   . Anxiety   . OCD (obsessive compulsive disorder)   . Nicotine addiction   . Depression     h/o SI not active as of 7/13  . Cholelithiasis   . DJD (degenerative joint disease)   . Insomnia   . Mitral regurgitation     noted 2010  . COPD (chronic obstructive pulmonary disease)     as of 7/13 on 2-3L, pfts 10/2008 with mod obstruction  . H/O epistaxis   . Rhinitis, allergic     Past Surgical History  Procedure Laterality Date  . Tubal ligation      Family History  Problem Relation Age of Onset  . Asthma    . Emphysema    . Allergies    . Cancer      aunt had several types of cancer    History  Substance Use Topics  . Smoking status: Current Every Day Smoker -- 0.20 packs/day for 30 years    Types: Cigarettes  . Smokeless tobacco: Not on file     Comment: states she smokes now and then.Sometimes none sometimes one a day for nerves  . Alcohol Use: Yes      Comment: very rare, special ocassions    OB History   Grav Para Term Preterm Abortions TAB SAB Ect Mult Living                  Review of Systems  Constitutional: Negative for fever and chills.  HENT: Negative for congestion, sore throat and rhinorrhea.   Eyes: Negative for visual disturbance.  Respiratory: Positive for cough, sputum production (yellow) and shortness of breath. Negative for chest tightness.   Cardiovascular: Positive for PND. Negative for chest pain, palpitations and claudication.  Gastrointestinal: Negative for nausea, vomiting, abdominal pain, diarrhea, constipation and rectal pain.  Genitourinary: Negative for dysuria.  Neurological: Negative for dizziness and weakness.  Psychiatric/Behavioral: Negative for confusion and dysphoric mood.  All other systems reviewed and are negative.    Allergies  Diphenhydramine hcl; Erythromycin; Nsaids; Nyquil; Tramadol hcl; and Pseudoephedrine  Home Medications   Current Outpatient Rx  Name  Route  Sig  Dispense  Refill  . albuterol (PROVENTIL HFA;VENTOLIN HFA) 108 (90 BASE) MCG/ACT inhaler   Inhalation   Inhale 2 puffs into the lungs every 6 (six) hours as needed for wheezing.   8.5 g   6   . aspirin 325 MG tablet   Oral   Take 650 mg by mouth daily.          Marland Kitchen  budesonide-formoterol (SYMBICORT) 160-4.5 MCG/ACT inhaler   Inhalation   Inhale 2 puffs into the lungs 2 (two) times daily.   10.2 g   11   . cetirizine (ZYRTEC) 10 MG tablet   Oral   Take 10 mg by mouth 2 (two) times daily.         . diazepam (VALIUM) 2 MG tablet   Oral   Take 1 tablet (2 mg total) by mouth every 12 (twelve) hours as needed for anxiety.   60 tablet   4   . diltiazem (TIAZAC) 420 MG 24 hr capsule   Oral   Take 1 capsule (420 mg total) by mouth daily.   90 capsule   3   . FLUoxetine (PROZAC) 20 MG capsule   Oral   Take 3 capsules (60 mg total) by mouth daily.   90 capsule   6   . furosemide (LASIX) 40 MG tablet    Oral   Take 1 tablet (40 mg total) by mouth daily with breakfast.   90 tablet   1   . HYDROcodone-homatropine (HYCODAN) 5-1.5 MG/5ML syrup   Oral   Take 5 mLs by mouth every 8 (eight) hours as needed for cough. For cough.   480 mL   0   . montelukast (SINGULAIR) 10 MG tablet   Oral   Take 1 tablet (10 mg total) by mouth at bedtime.   30 tablet   3   . pantoprazole (PROTONIX) 40 MG tablet   Oral   Take 1 tablet (40 mg total) by mouth daily.   30 tablet   6   . potassium chloride SA (K-DUR,KLOR-CON) 20 MEQ tablet   Oral   Take 0.5 tablets (10 mEq total) by mouth 2 (two) times daily.   30 tablet   6   . tiotropium (SPIRIVA) 18 MCG inhalation capsule   Inhalation   Place 1 capsule (18 mcg total) into inhaler and inhale daily.   90 capsule   3   . EXPIRED: HYDROcodone-acetaminophen (NORCO) 7.5-325 MG per tablet   Oral   Take 1 tablet by mouth every 6 (six) hours as needed for pain.   120 tablet   5     BP 110/50  Pulse 65  Temp(Src) 98.6 F (37 C) (Oral)  Resp 18  Wt 221 lb 4 oz (100.358 kg)  BMI 38.57 kg/m2  SpO2 95%  Physical Exam  Constitutional: She is oriented to person, place, and time. She appears well-developed and well-nourished. No distress.  HENT:  Head: Normocephalic and atraumatic.  Mouth/Throat: Oropharynx is clear and moist.  Eyes: EOM are normal. Pupils are equal, round, and reactive to light.  Neck: Normal range of motion. Neck supple.  Cardiovascular: Normal rate, regular rhythm and normal heart sounds.  Exam reveals no friction rub.   No murmur heard. Pulmonary/Chest: Effort normal. No respiratory distress. She has no wheezes. She has rales (faint crackles at bases. ).  Abdominal: Soft. There is no tenderness. There is no rebound and no guarding.  Musculoskeletal: Normal range of motion. She exhibits edema (2+ non pitting edema BLEs. ). She exhibits no tenderness.  Lymphadenopathy:    She has no cervical adenopathy.  Neurological: She is  alert and oriented to person, place, and time.  Skin: Skin is warm and dry. No rash noted. No erythema.  Psychiatric: She has a normal mood and affect. Her behavior is normal.    ED Course  Procedures (including critical care time)  Labs  Reviewed  CBC - Abnormal; Notable for the following:    WBC 12.0 (*)    All other components within normal limits  BASIC METABOLIC PANEL - Abnormal; Notable for the following:    Glucose, Bld 115 (*)    GFR calc non Af Amer 59 (*)    GFR calc Af Amer 68 (*)    All other components within normal limits  PRO B NATRIURETIC PEPTIDE - Abnormal; Notable for the following:    Pro B Natriuretic peptide (BNP) 1190.0 (*)    All other components within normal limits  POCT I-STAT TROPONIN I   Dg Chest 2 View  07/14/2012   *RADIOLOGY REPORT*  Clinical Data: Shortness of breath and hypertension  CHEST - 2 VIEW  Comparison:  July 13, 2012  Findings:  There is minimal scarring in the lower lobes. There is mild central peribronchial thickening.  The nodular opacity seen recently on the left is not seen on this examination.  There is no edema or consolidation.  Heart is upper normal in size with normal pulmonary vascularity.  No adenopathy. There are no appreciable bone lesions.  IMPRESSION:   Evidence of a degree of a bronchitis.  No edema or consolidation.  The nodular opacity seen recently on the left is not seen at this time.   Original Report Authenticated By: Bretta Bang, M.D.   Dg Chest 2 View  07/13/2012   *RADIOLOGY REPORT*  Clinical Data: Shortness of breath, leg swelling bilaterally  CHEST - 2 VIEW  Comparison: Chest x-ray of 10/01/2011  Findings: The previously described mild interstitial prominence is stable and may represent mild interstitial lung disease.  No definite active infiltrate or effusion is seen.  However, there is a vague nodular opacity at the left lung base.  This may be due to linear scarring noted in the lingula on the prior CT of the chest  dated August 2012.  Follow-up chest x-ray is recommended to assess stability.  The heart is mildly enlarged.  No bony abnormality is seen. There are gallstones within the left quadrant.  IMPRESSION:  1.  Cardiomegaly. 2.  Stable mild interstitial prominence. 3.  Vague nodular opacity in the left lung base may be due to scarring in the lingula when compared to prior CT of the chest. Recommend attention to this area on follow-up chest x-ray. 4.  Gallstones.   Original Report Authenticated By: Dwyane Dee, M.D.     1. CHF (congestive heart failure), acute on chronic, systolic     Date: 07/14/2012  Rate: 62  Rhythm: atrial fibrillation  QRS Axis: normal  Intervals: normal  ST/T Wave abnormalities: nonspecific ST changes  Conduction Disutrbances:none  Narrative Interpretation:   Old EKG Reviewed: unchanged     MDM  27:32 PM 67 year old female history ischemic cardiomyopathy with EF in 10/2008 of 60-65%, COPD on 2 L home O2 and atrial fibrillation not on anticoagulation presenting with worsening dyspnea with bilateral lower extremity edema. The patient was seen by her PCP several times recently. yesterday her Lasix was increased to 40 mg twice a day and she had BNP around 870. She presents now with continued and worsening symptoms. She has no increased O2 requirement. She has faint crackles at both bases 2+ nonpitting edema for lower extremities. BNP here around 1190. Troponin unremarkable. EKG shows rate-controlled A. fib. Overall picture concerning for congestive heart failure exacerbation. No evidence of pneumonia on chest x-ray. Story not consistent with PE or ACS. Medicine consult and patient admitted for  further management.        Caren Hazy, MD 07/14/12 2035

## 2012-07-14 NOTE — ED Notes (Signed)
Patient to the floor at this time, NAD at time of transport, patient to have IV placed on floor via IV team.  Plan of action for this cleared with IV team and receiving RN, Racida.
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
## 2012-07-14 NOTE — ED Notes (Signed)
Pt told to come here for eval of elevated lab values and increased fluid retention and SOB; pt on home O2

## 2012-07-14 NOTE — ED Notes (Signed)
Patient transported to X-ray 

## 2012-07-14 NOTE — Telephone Encounter (Signed)
Agree. thanks

## 2012-07-15 ENCOUNTER — Other Ambulatory Visit: Payer: Self-pay

## 2012-07-15 DIAGNOSIS — I369 Nonrheumatic tricuspid valve disorder, unspecified: Secondary | ICD-10-CM

## 2012-07-15 LAB — BASIC METABOLIC PANEL
Calcium: 9 mg/dL (ref 8.4–10.5)
GFR calc non Af Amer: >90 mL/min (ref >90)
Potassium: 3.4 mEq/L — ABNORMAL LOW (ref 3.5–5.1)
Sodium: 141 mEq/L (ref 135–145)

## 2012-07-15 LAB — CBC
Hemoglobin: 11.6 g/dL — ABNORMAL LOW (ref 12.0–15.0)
Platelets: 215 10*3/uL (ref 150–400)
RBC: 3.66 MIL/uL — ABNORMAL LOW (ref 3.87–5.11)
WBC: 8.2 10*3/uL (ref 4.0–10.5)

## 2012-07-15 LAB — TROPONIN I: Troponin I: <0.3 ng/mL (ref <0.30)

## 2012-07-15 LAB — MAGNESIUM: Magnesium: 2 mg/dL (ref 1.5–2.5)

## 2012-07-15 LAB — TSH: TSH: 1.098 u[IU]/mL (ref 0.350–4.500)

## 2012-07-15 NOTE — Progress Notes (Signed)
  Echocardiogram 2D Echocardiogram has been performed.  Uthman Mroczkowski FRANCES 07/15/2012, 4:08 PM

## 2012-07-15 NOTE — Progress Notes (Signed)
TEACHING ATTENDING ADDENDUM: I discussed this case with Dr. Patel at the time of the patient visit. I agree with the HPI, exam findings and have read the documentation provided by the resident,  and I concur with the plan of care. Please see the resident note for details of management.    

## 2012-07-15 NOTE — Progress Notes (Signed)
Utilization Review Completed.   Loral Campi, RN, BSN Nurse Case Manager  [phone]  

## 2012-07-15 NOTE — Progress Notes (Signed)
Subjective:    Pt states she is feeling much better after starting IV lasix. She feels her bilateral leg swelling is already improving, and SOB is much improved as well. She states she has had significant urine output this morning. Denies chest pain. No other complaints.   Interval Events: No acute events.    Objective:    Vital Signs:   Temp:  [98 F (36.7 C)-98.6 F (37 C)] 98.6 F (37 C) (06/06 0518) Pulse Rate:  [78-92] 92 (06/06 0518) Resp:  [15-22] 20 (06/06 0518) BP: (119-152)/(52-91) 137/63 mmHg (06/06 0948) SpO2:  [94 %-99 %] 94 % (06/06 0518) FiO2 (%):  [98 %] 98 % (06/05 1443) Weight:  [216 lb 1.6 oz (98.022 kg)-220 lb 8 oz (100.018 kg)] 216 lb 1.6 oz (98.022 kg) (06/06 0518) Last BM Date: 07/15/12  24-hour weight change: Weight change:   Intake/Output:   Intake/Output Summary (Last 24 hours) at 07/15/12 1442 Last data filed at 07/15/12 1326  Gross per 24 hour  Intake    780 ml  Output   2825 ml  Net  -2045 ml      Physical Exam: General: Vital signs reviewed and noted. Well-developed, well-nourished, in no acute distress; alert, appropriate and cooperative throughout examination.  Lungs:  Normal respiratory effort. Clear to auscultation BL without crackles or wheezes.  Heart: Irregularly irregular. S1 and S2 normal without gallop, murmur, or rubs.  Abdomen:  BS normoactive. Soft, Nondistended, non-tender.  No masses or organomegaly.  Extremities: Trace pitting edema in BLE.      Labs:  Basic Metabolic Panel:  Recent Labs Lab 07/13/12 1618 07/14/12 1434 07/15/12 0505  NA 140 142 141  K 3.7 3.5 3.4*  CL 101 101 101  CO2 30 32 33*  GLUCOSE 90 115* 102*  BUN 18 14 9   CREATININE 0.90 0.99 0.65  CALCIUM 9.2 9.1 9.0  MG  --   --  2.0    Liver Function Tests:  Recent Labs Lab 07/13/12 1618  AST 16  ALT 11  ALKPHOS 104  BILITOT 0.3  PROT 7.0  ALBUMIN 3.5    CBC:  Recent Labs Lab 07/13/12 1618 07/14/12 1434 07/15/12 0505  WBC  13.8* 12.0* 8.2  HGB 12.5 12.8 11.6*  HCT 38.7 39.5 36.1  MCV 99.0 99.2 98.6  PLT 229 251 215    Cardiac Enzymes:  Recent Labs Lab 07/13/12 1618 07/15/12 1024  TROPONINI <0.30 <0.30     Microbiology: Results for orders placed during the hospital encounter of 12/28/08  CULTURE, BLOOD (ROUTINE X 2)     Status: None   Collection Time    12/28/08  4:22 PM      Result Value Range Status   Specimen Description BLOOD RIGHT ARM   Final   Special Requests BOTTLES DRAWN AEROBIC AND ANAEROBIC 10CC   Final   Culture NO GROWTH 5 DAYS   Final   Report Status 01/04/2009 FINAL   Final  CULTURE, BLOOD (ROUTINE X 2)     Status: None   Collection Time    12/28/08  4:37 PM      Result Value Range Status   Specimen Description BLOOD LEFT ARM   Final   Special Requests BOTTLES DRAWN AEROBIC AND ANAEROBIC 10CC   Final   Culture NO GROWTH 5 DAYS   Final   Report Status 01/04/2009 FINAL   Final  CULTURE, SPUTUM-ASSESSMENT     Status: None   Collection Time    12/29/08  5:46 AM      Result Value Range Status   Specimen Description SPUTUM   Final   Special Requests AVELOX IMMUNE:NORM   Final   Sputum evaluation     Final   Value: MICROSCOPIC FINDINGS SUGGEST THAT THIS SPECIMEN IS NOT REPRESENTATIVE OF LOWER RESPIRATORY SECRETIONS. PLEASE RECOLLECT.     CALLED TO [facility] 1610 12/29/08 M.CAMPBELL   Report Status 12/29/2008 FINAL   Final   Imaging: Dg Chest 2 View  07/14/2012   *RADIOLOGY REPORT*  Clinical Data: Shortness of breath and hypertension  CHEST - 2 VIEW  Comparison:  July 13, 2012  Findings:  There is minimal scarring in the lower lobes. There is mild central peribronchial thickening.  The nodular opacity seen recently on the left is not seen on this examination.  There is no edema or consolidation.  Heart is upper normal in size with normal pulmonary vascularity.  No adenopathy. There are no appreciable bone lesions.  IMPRESSION:   Evidence of a degree of a bronchitis.  No edema or  consolidation.  The nodular opacity seen recently on the left is not seen at this time.   Original Report Authenticated By: Bretta Bang, M.D.   Dg Chest 2 View  07/13/2012   *RADIOLOGY REPORT*  Clinical Data: Shortness of breath, leg swelling bilaterally  CHEST - 2 VIEW  Comparison: Chest x-ray of 10/01/2011  Findings: The previously described mild interstitial prominence is stable and may represent mild interstitial lung disease.  No definite active infiltrate or effusion is seen.  However, there is a vague nodular opacity at the left lung base.  This may be due to linear scarring noted in the lingula on the prior CT of the chest dated August 2012.  Follow-up chest x-ray is recommended to assess stability.  The heart is mildly enlarged.  No bony abnormality is seen. There are gallstones within the left quadrant.  IMPRESSION:  1.  Cardiomegaly. 2.  Stable mild interstitial prominence. 3.  Vague nodular opacity in the left lung base may be due to scarring in the lingula when compared to prior CT of the chest. Recommend attention to this area on follow-up chest x-ray. 4.  Gallstones.   Original Report Authenticated By: Dwyane Dee, M.D.       Medications:    Infusions:    Scheduled Medications: . aspirin  650 mg Oral Daily  . budesonide-formoterol  2 puff Inhalation BID  . diltiazem  420 mg Oral Daily  . FLUoxetine  60 mg Oral Daily  . furosemide  40 mg Intravenous BID  . heparin  5,000 Units Subcutaneous Q8H  . loratadine  10 mg Oral Daily  . montelukast  10 mg Oral QHS  . pantoprazole  40 mg Oral Daily  . predniSONE  40 mg Oral Q breakfast  . sodium chloride  3 mL Intravenous Q12H  . sodium chloride  3 mL Intravenous Q12H  . tiotropium  18 mcg Inhalation Daily    PRN Medications: sodium chloride, albuterol, diazepam, HYDROcodone-acetaminophen, sodium chloride   Assessment/ Plan:   Pt is a 67 y.o. yo female with a PMHx of CHF, COPD who was admitted on 07/14/2012 with symptoms of  SOB, lower extremity edema, which was determined to be secondary to CHF exacerbation.  Acute CHF exacerbation - Pt's signs/symptoms consistent with CHF exacerbation. ProBNP = 1190. Net I/O = -2L since admission. She reports significant clinical improvement with IV lasix diuresis this AM. Unclear why she is having an exacerbation of  CHF after 9 years of no exacerbations and no changes to her medication regimen (to which pt reports full compliance). No evidence of recent MI, EKG unchanged from previous, and troponin neg x 2. Will continue to investigate this issue as well as re-evaluate the patient's cardiac function with echocardiography. - cont IV lasix 40mg  bid - 2d echo  Atrial fibrillation - pt remains in atrial fibrillation chronically. She has made an informed decision against warfarin anticoagulation due to bruising and concerns for intracranial/GI bleed. Appears to have good rate control on her home dose of diltiazem.  - cont ASA - cont diltiazem (24hr) 420mg  QD  COPD - no convincing evidence of exacerbation at this time. Pt has refused the prednisone ordered on admission, and as clinical evidence is in better support of CHF exacerbation as the cause of the patient's SOB, will discontinue this medication at this time.  - cont spiriva - cont singulair - cont albuterol nebs PRN  HTN - BP well-controlled on current regimen.  - cont diltiazem - cont lasix  GERD - cont protonix.  Depression - stable.  - cont fluoxetine  Insomnia - stable.  - cont diazepam    DVT PPX - heparin  CODE STATUS - full  CONSULTS PLACED - N/A  DISPO - Disposition is deferred at this time, awaiting improvement of CHF exacerbation.   Anticipated discharge in approximately 1-2 day(s).   The patient does have a current PCP (PRIBULA,CHRISTOPHER, MD) and does need an [facility] hospital follow-up appointment after discharge.    Is the [facility] hospital follow-up appointment a one-time only appointment? not  applicable.  Does the patient have transportation limitations that hinder transportation to clinic appointments? no   SERVICE NEEDED AT DISCHARGE - TO BE DETERMINED DURING HOSPITAL COURSE         Y = Yes, Blank = No PT:   OT:   RN:   Equipment:   Other:      Length of Stay: 1 day(s)   Signed: Elfredia Nevins, MD  PGY-1, Internal Medicine Resident Pager: [phone] (7AM-5PM) 07/15/2012, 2:42 PM

## 2012-07-15 NOTE — Progress Notes (Signed)
Patient evaluated for community based chronic disease management services with [facility] Care Management Program as a benefit of patient's [employer]. Patient will receive a post discharge transition of care call and will be evaluated for monthly home visits for assessments and disease process education. Spoke with patient at bedside to explain [facility] Care Management services. She has accepted services and consents have been obtained.  She does not have a scale and can not immediately afford one because of her recent move into subsidized housing and the recent inconsistencies with [place] food stamp distribution.  She has a pulse oximeter, a oxygen concentrator from [facility], a power wheelchair, and uses [employer] for medical transportation.  She is well pleased with the PCP services received from the [facility].  She obtains her medications from [facility] on [address] in [place].  Currently she is able to afford her medications.  She needs a ramp and has begun the process with the [employer] now that she has her new home secured.  Lynnae January LCSW in the [facility] is assisting with this.  Patient would benefit from CHF management teaching.  She was trying to manage her weight using the weights obtained only during her MD visits.  She experienced a 10 lb weight gain in four days that she could not address due to a lack of scales.  Left contact information and [facility] literature at bedside. Made inpatient Case Manager aware that [facility] Care Management following. Of note, [facility] Care Management services does not replace or interfere with any services that are arranged by inpatient case management or social work.  For additional questions or referrals please contact Anibal Henderson BSN RN [facility] Liaison at [phone].

## 2012-07-15 NOTE — Progress Notes (Signed)
Pt given heart failure materials, but refused to watch the video, stated she has had heart failure since 2003 and she is very much aware of heart failure. Echo completed today, remains alert and oriented, vital signs stable, will continue to monitor.

## 2012-07-15 NOTE — H&P (Signed)
Internal Medicine Attending Admission Note Date: 07/15/2012  Patient name: Kelsey Garcia Medical record number: [id number] Date of birth: 1945-07-12 Age: 67 y.o. Gender: female  I saw and evaluated the patient. I reviewed the resident's note and I agree with the resident's findings and plan as documented in the resident's note, with the following additional comments.  Chief Complaint(s): Bilateral leg edema, shortness of breath  History - key components related to admission: Patient is a 67 year old woman with history of atrial fibrillation, nonischemic cardiomyopathy, hypertension, COPD, osteoarthritis, and other problems as outlined in the medical history, admitted with complaint of increasing bilateral leg edema and shortness of breath over the past week.  She denies any chest pain, changes in her diet, or medication noncompliance.   Physical Exam - key components related to admission:  Filed Vitals:   07/14/12 2146 07/15/12 0145 07/15/12 0518 07/15/12 0948  BP:  119/71 136/91 137/63  Pulse:  92 92   Temp:  98.3 F (36.8 C) 98.6 F (37 C)   TempSrc:  Oral Oral   Resp:  18 20   Height:      Weight:   216 lb 1.6 oz (98.022 kg)   SpO2: 98% 99% 94%     General: Alert, no distress Lungs: Few bibasilar crackles, otherwise clear Heart: Regular; S1-S2, no S3, no S4, no murmurs Abdomen: Bowel sounds present, soft, nontender Extremities: Trace bilateral ankle edema  Lab results:   Basic Metabolic Panel:  Recent Labs  40/98/11 1434 07/15/12 0505  NA 142 141  K 3.5 3.4*  CL 101 101  CO2 32 33*  GLUCOSE 115* 102*  BUN 14 9  CREATININE 0.99 0.65  CALCIUM 9.1 9.0  MG  --  2.0   Liver Function Tests:  Recent Labs  07/13/12 1618  AST 16  ALT 11  ALKPHOS 104  BILITOT 0.3  PROT 7.0  ALBUMIN 3.5    CBC:  Recent Labs  07/14/12 1434 07/15/12 0505  WBC 12.0* 8.2  HGB 12.8 11.6*  HCT 39.5 36.1  MCV 99.2 98.6  PLT 251 215    Cardiac Enzymes:  Recent Labs  07/13/12 1618 07/15/12 1024  TROPONINI <0.30 <0.30    BNP (last 3 results)  Recent Labs  07/13/12 1618 07/14/12 1434  PROBNP 857.6* 1190.0*     Imaging results:  Dg Chest 2 View  07/14/2012   *RADIOLOGY REPORT*  Clinical Data: Shortness of breath and hypertension  CHEST - 2 VIEW  Comparison:  July 13, 2012  Findings:  There is minimal scarring in the lower lobes. There is mild central peribronchial thickening.  The nodular opacity seen recently on the left is not seen on this examination.  There is no edema or consolidation.  Heart is upper normal in size with normal pulmonary vascularity.  No adenopathy. There are no appreciable bone lesions.  IMPRESSION:   Evidence of a degree of a bronchitis.  No edema or consolidation.  The nodular opacity seen recently on the left is not seen at this time.   Original Report Authenticated By: Bretta Bang, M.D.   Dg Chest 2 View  07/13/2012   *RADIOLOGY REPORT*  Clinical Data: Shortness of breath, leg swelling bilaterally  CHEST - 2 VIEW  Comparison: Chest x-ray of 10/01/2011  Findings: The previously described mild interstitial prominence is stable and may represent mild interstitial lung disease.  No definite active infiltrate or effusion is seen.  However, there is a vague nodular opacity at the left lung base.  This may be due to linear scarring noted in the lingula on the prior CT of the chest dated August 2012.  Follow-up chest x-ray is recommended to assess stability.  The heart is mildly enlarged.  No bony abnormality is seen. There are gallstones within the left quadrant.  IMPRESSION:  1.  Cardiomegaly. 2.  Stable mild interstitial prominence. 3.  Vague nodular opacity in the left lung base may be due to scarring in the lingula when compared to prior CT of the chest. Recommend attention to this area on follow-up chest x-ray. 4.  Gallstones.   Original Report Authenticated By: Dwyane Dee, M.D.    Other results: EKG: Atrial fibrillation; low  voltage QRS; poor anterior R wave progression; no significant change since last tracing.   Assessment & Plan by Problem:  1.  Shortness of breath with bilateral leg edema.  Patient reports progressive worsening of symptoms over the past week; her symptoms are consistent with congestive heart failure.  Review of her chart shows a mention of decreased LV ejection fraction of 25% in February of 2004 (the echocardiogram report is not available), with subsequent left ventricular ejection fraction of 40-50% in May of 2005, and 60-65% in September of 2010.  Her cardiomyopathy was then described as nonischemic, but I do not see a report of a coronary workup.  She reports improvement in her leg edema and shortness of breath following initial treatment here with diuretic.  The plan is diuresis; follow ins and outs, weights, renal function, and electrolytes; obtain 2-D echocardiogram to assess left ventricular function and valves.  2.  COPD.  There may be a component of COPD exacerbation contributing to patient's symptoms.  Plans include inhaled bronchodilators; patient declined prednisone.  3.  Chronic atrial fibrillation.  Patient's rate is well controlled.  She has declined anticoagulation for stroke prophylaxis in the past, and is currently on aspirin; we talked with her again about this today, but she is definite in her decision against anticoagulation because of the bleeding risk.  Will continue diltiazem at current dose.  4.  Other problems and plans as per the resident physician's note.

## 2012-07-15 NOTE — Plan of Care (Signed)
Problem: Phase I Progression Outcomes Goal: EF % per last Echo/documented,Core Reminder form on chart Outcome: Progressing Results pending at this time

## 2012-07-16 DIAGNOSIS — J449 Chronic obstructive pulmonary disease, unspecified: Secondary | ICD-10-CM

## 2012-07-16 DIAGNOSIS — I5023 Acute on chronic systolic (congestive) heart failure: Secondary | ICD-10-CM

## 2012-07-16 DIAGNOSIS — I509 Heart failure, unspecified: Secondary | ICD-10-CM

## 2012-07-16 LAB — BASIC METABOLIC PANEL
Calcium: 9 mg/dL (ref 8.4–10.5)
GFR calc Af Amer: >90 mL/min (ref >90)
GFR calc non Af Amer: 89 mL/min — ABNORMAL LOW (ref >90)
Glucose, Bld: 73 mg/dL (ref 70–99)
Potassium: 4.1 mEq/L (ref 3.5–5.1)
Sodium: 135 mEq/L (ref 135–145)

## 2012-07-16 MED ORDER — BUDESONIDE-FORMOTEROL FUMARATE 160-4.5 MCG/ACT IN AERO
2.0000 | INHALATION_SPRAY | Freq: Two times a day (BID) | RESPIRATORY_TRACT | Status: DC
  Administered 2012-07-16 – 2012-07-17 (×2): 2 via RESPIRATORY_TRACT
  Filled 2012-07-16: qty 6

## 2012-07-16 MED ORDER — PREDNISONE 20 MG PO TABS
40.0000 mg | ORAL_TABLET | Freq: Every day | ORAL | Status: DC
  Administered 2012-07-16 – 2012-07-17 (×2): 40 mg via ORAL
  Filled 2012-07-16 (×3): qty 2

## 2012-07-16 MED ORDER — FUROSEMIDE 40 MG PO TABS: 40.0000 mg | ORAL_TABLET | Freq: Two times a day (BID) | ORAL | Status: DC

## 2012-07-16 MED ORDER — DOXYCYCLINE HYCLATE 100 MG PO TABS
100.0000 mg | ORAL_TABLET | Freq: Two times a day (BID) | ORAL | Status: DC
  Administered 2012-07-16 – 2012-07-17 (×3): 100 mg via ORAL
  Filled 2012-07-16 (×4): qty 1

## 2012-07-16 MED ORDER — TIOTROPIUM BROMIDE MONOHYDRATE 18 MCG IN CAPS
18.0000 ug | ORAL_CAPSULE | Freq: Every day | RESPIRATORY_TRACT | Status: DC
  Administered 2012-07-16 – 2012-07-17 (×2): 18 ug via RESPIRATORY_TRACT

## 2012-07-16 MED ORDER — FUROSEMIDE 40 MG PO TABS
40.0000 mg | ORAL_TABLET | Freq: Two times a day (BID) | ORAL | Status: DC
  Administered 2012-07-16 – 2012-07-17 (×3): 40 mg via ORAL
  Filled 2012-07-16 (×4): qty 1

## 2012-07-16 NOTE — Progress Notes (Signed)
Internal Medicine Attending  Date: 07/16/2012  Patient name: Kelsey Garcia Medical record number: [id number] Date of birth: [birth year] Age: 67 y.o. Gender: female  I saw and evaluated the patient. I reviewed the resident's note by Dr. Lavena Bullion and I agree with the resident's findings and plans as documented in his note.

## 2012-07-16 NOTE — Progress Notes (Signed)
Pt resting in bed no c/o noted.

## 2012-07-16 NOTE — Progress Notes (Signed)
Patient asleep, resting comfortably.

## 2012-07-16 NOTE — Progress Notes (Signed)
Pt c/o 7/10 pain to her left leg, it's a chronic pain as pt has hx arthritis, 1 tablet of norco given. Will reassess pain in an hour. Will continue to monitor.

## 2012-07-16 NOTE — ED Provider Notes (Signed)
I have personally seen and examined the patient.  I have discussed the plan of care with the resident.  I have reviewed the documentation on PMH/FH/Soc. History.  I have reviewed the documentation of the resident and agree.  I have reviewed and agree with the ECG interpretation(s) documented by the resident.  Pt failing outpatient treatment, would benefit from inpatient care by her PCP Pt stable in the ED She agrees with plan  Joya Gaskins, MD 07/16/12 [phone]

## 2012-07-16 NOTE — Progress Notes (Signed)
Subjective:    Pt complains of persistent cough with increasing green sputum production. SOB improved. Denies CP. No other complaints this AM.   Interval Events: No acute events.    Objective:    Vital Signs:   Temp:  [97.6 F (36.4 C)-98 F (36.7 C)] 97.6 F (36.4 C) (06/07 0631) Pulse Rate:  [77-91] 91 (06/07 0631) Resp:  [18-20] 18 (06/07 0631) BP: (107-137)/(55-64) 131/64 mmHg (06/07 0631) SpO2:  [94 %-96 %] 94 % (06/07 0631) Weight:  [215 lb 6.2 oz (97.7 kg)] 215 lb 6.2 oz (97.7 kg) (06/07 0631) Last BM Date: 07/15/12  24-hour weight change: Weight change: -5 lb 13.8 oz (-2.658 kg)  Intake/Output:   Intake/Output Summary (Last 24 hours) at 07/16/12 0947 Last data filed at 07/16/12 0900  Gross per 24 hour  Intake   1300 ml  Output   1900 ml  Net   -600 ml      Physical Exam: General: Vital signs reviewed and noted. Well-developed, well-nourished, in no acute distress; alert, appropriate and cooperative throughout examination.  Lungs:  Normal respiratory effort. Clear to auscultation BL without crackles or wheezes.  Heart: RRR. S1 and S2 normal without gallop, murmur, or rubs.  Abdomen:  BS normoactive. Soft, Nondistended, non-tender.  No masses or organomegaly.  Extremities: No pretibial edema.     Labs:  Basic Metabolic Panel:  Recent Labs Lab 07/13/12 1618 07/14/12 1434 07/15/12 0505  NA 140 142 141  K 3.7 3.5 3.4*  CL 101 101 101  CO2 30 32 33*  GLUCOSE 90 115* 102*  BUN 18 14 9   CREATININE 0.90 0.99 0.65  CALCIUM 9.2 9.1 9.0  MG  --   --  2.0    Liver Function Tests:  Recent Labs Lab 07/13/12 1618  AST 16  ALT 11  ALKPHOS 104  BILITOT 0.3  PROT 7.0  ALBUMIN 3.5   CBC:  Recent Labs Lab 07/13/12 1618 07/14/12 1434 07/15/12 0505  WBC 13.8* 12.0* 8.2  HGB 12.5 12.8 11.6*  HCT 38.7 39.5 36.1  MCV 99.0 99.2 98.6  PLT 229 251 215    Cardiac Enzymes:  Recent Labs Lab 07/13/12 1618 07/15/12 1024  TROPONINI <0.30 <0.30      Microbiology: Results for orders placed during the hospital encounter of 12/28/08  CULTURE, BLOOD (ROUTINE X 2)     Status: None   Collection Time    12/28/08  4:22 PM      Result Value Range Status   Specimen Description BLOOD RIGHT ARM   Final   Special Requests BOTTLES DRAWN AEROBIC AND ANAEROBIC 10CC   Final   Culture NO GROWTH 5 DAYS   Final   Report Status 01/04/2009 FINAL   Final  CULTURE, BLOOD (ROUTINE X 2)     Status: None   Collection Time    12/28/08  4:37 PM      Result Value Range Status   Specimen Description BLOOD LEFT ARM   Final   Special Requests BOTTLES DRAWN AEROBIC AND ANAEROBIC 10CC   Final   Culture NO GROWTH 5 DAYS   Final   Report Status 01/04/2009 FINAL   Final  CULTURE, SPUTUM-ASSESSMENT     Status: None   Collection Time    12/29/08  5:46 AM      Result Value Range Status   Specimen Description SPUTUM   Final   Special Requests AVELOX IMMUNE:NORM   Final   Sputum evaluation     Final  Value: MICROSCOPIC FINDINGS SUGGEST THAT THIS SPECIMEN IS NOT REPRESENTATIVE OF LOWER RESPIRATORY SECRETIONS. PLEASE RECOLLECT.     CALLED TO [facility] 4098 12/29/08 M.CAMPBELL   Report Status 12/29/2008 FINAL   Final    Imaging: Dg Chest 2 View  07/14/2012   *RADIOLOGY REPORT*  Clinical Data: Shortness of breath and hypertension  CHEST - 2 VIEW  Comparison:  July 13, 2012  Findings:  There is minimal scarring in the lower lobes. There is mild central peribronchial thickening.  The nodular opacity seen recently on the left is not seen on this examination.  There is no edema or consolidation.  Heart is upper normal in size with normal pulmonary vascularity.  No adenopathy. There are no appreciable bone lesions.  IMPRESSION:   Evidence of a degree of a bronchitis.  No edema or consolidation.  The nodular opacity seen recently on the left is not seen at this time.   Original Report Authenticated By: Bretta Bang, M.D.       Medications:    Infusions:     Scheduled Medications: . aspirin  650 mg Oral Daily  . budesonide-formoterol  2 puff Inhalation BID  . diltiazem  420 mg Oral Daily  . doxycycline  100 mg Oral Q12H  . FLUoxetine  60 mg Oral Daily  . furosemide  40 mg Oral BID  . heparin  5,000 Units Subcutaneous Q8H  . loratadine  10 mg Oral Daily  . montelukast  10 mg Oral QHS  . pantoprazole  40 mg Oral Daily  . predniSONE  40 mg Oral Q breakfast  . sodium chloride  3 mL Intravenous Q12H  . sodium chloride  3 mL Intravenous Q12H  . tiotropium  18 mcg Inhalation Daily    PRN Medications: sodium chloride, albuterol, diazepam, HYDROcodone-acetaminophen, sodium chloride   Assessment/ Plan:   Pt is a 67 y.o. yo female with a PMHx of CHF, COPD who was admitted on 07/14/2012 with symptoms of SOB, lower extremity edema, which was determined to be secondary to CHF exacerbation.   Acute CHF exacerbation - Pt's signs/symptoms consistent with CHF exacerbation. ProBNP = 1190. Net I/O = -1.9L since admission. She appears to be having a COPD exacerbation concomitantly, which is the only identifiable explanation for her recent worsening of her CHF.  - cont IV lasix 40mg  bid  - 2d echo pending  Atrial fibrillation - No RVR since admission; rate currently 80s-90s. Pt remains in atrial fibrillation chronically. She has made an informed decision against warfarin anticoagulation due to bruising and concerns for intracranial/GI bleed. - cont ASA  - cont diltiazem (24hr) 420mg  QD   COPD - increased sputum production and purulence this AM. Also continues to be somewhat dyspneic despite diuresis, thus will treat for presumed COPD exacerbation at this time.  - doxycycline 100mg  bid x 5 days - prednisone 40mg  x 5 days - cont spiriva  - cont singulair  - cont albuterol nebs PRN   HTN - BP well-controlled on current regimen.  - cont diltiazem  - cont lasix   GERD - stable.  - cont protonix  Depression - stable.  - cont fluoxetine   Insomnia  - stable.  - cont diazepam   DVT PPX - heparin  CODE STATUS - full CONSULTS PLACED - N/A DISPO - Disposition is deferred at this time, awaiting improvement of CHF exacerbation.  Anticipated discharge in approximately 1-2 day(s).  The patient does have a current PCP (PRIBULA,CHRISTOPHER, MD) and does need an [facility] hospital  follow-up appointment after discharge.  Is the [facility] hospital follow-up appointment a one-time only appointment? not applicable.  Does the patient have transportation limitations that hinder transportation to clinic appointments? no SERVICE NEEDED AT DISCHARGE - TO BE DETERMINED DURING HOSPITAL COURSE Y = Yes, Blank = No  PT:    OT:    RN:    Equipment:    Other:       Length of Stay: 2 day(s)   Signed: Elfredia Nevins, MD  PGY-1, Internal Medicine Resident Pager: [phone] (7AM-5PM) 07/16/2012, 9:47 AM

## 2012-07-16 NOTE — Plan of Care (Signed)
Problem: Phase I Progression Outcomes Goal: EF % per last Echo/documented,Core Reminder form on chart Outcome: Completed/Met Date Met:  07/16/12 EF= 60-65%

## 2012-07-17 MED ORDER — PREDNISONE 20 MG PO TABS: 40.0000 mg | ORAL_TABLET | Freq: Every day | ORAL | Status: DC

## 2012-07-17 MED ORDER — MAGIC MOUTHWASH: 2.0000 mL | Freq: Two times a day (BID) | ORAL | Status: DC

## 2012-07-17 MED ORDER — FUROSEMIDE 40 MG PO TABS: 40.0000 mg | ORAL_TABLET | Freq: Two times a day (BID) | ORAL | Status: DC

## 2012-07-17 MED ORDER — DOXYCYCLINE HYCLATE 100 MG PO TABS: 100.0000 mg | ORAL_TABLET | Freq: Two times a day (BID) | ORAL | Status: DC

## 2012-07-17 NOTE — Discharge Summary (Signed)
Internal Medicine Teaching [facility] Discharge Note  Name: Kelsey Garcia MRN: [id number] DOB: [birth year] 67 y.o.  Date of Admission: 07/14/2012  2:30 PM Date of Discharge: 07/17/2012 Attending Physician: Farley Ly, MD  Discharge Diagnosis: Principal Problem:   CHF exacerbation Active Problems:   HYPERTENSION   CARDIOMYOPATHY   Atrial fibrillation   COPD   GERD (gastroesophageal reflux disease)   Allergic rhinitis  Discharge Medications:   Medication List    TAKE these medications       albuterol 108 (90 BASE) MCG/ACT inhaler  Commonly known as:  PROVENTIL HFA;VENTOLIN HFA  Inhale 2 puffs into the lungs every 6 (six) hours as needed for wheezing.     aspirin 325 MG tablet  Take 650 mg by mouth daily.     budesonide-formoterol 160-4.5 MCG/ACT inhaler  Commonly known as:  SYMBICORT  Inhale 2 puffs into the lungs 2 (two) times daily.     cetirizine 10 MG tablet  Commonly known as:  ZYRTEC  Take 10 mg by mouth 2 (two) times daily.     diazepam 2 MG tablet  Commonly known as:  VALIUM  Take 1 tablet (2 mg total) by mouth every 12 (twelve) hours as needed for anxiety.     diltiazem 420 MG 24 hr capsule  Commonly known as:  TIAZAC  Take 1 capsule (420 mg total) by mouth daily.     doxycycline 100 MG tablet  Commonly known as:  VIBRA-TABS  Take 1 tablet (100 mg total) by mouth every 12 (twelve) hours.     FLUoxetine 20 MG capsule  Commonly known as:  PROZAC  Take 3 capsules (60 mg total) by mouth daily.     furosemide 40 MG tablet  Commonly known as:  LASIX  Take 1 tablet (40 mg total) by mouth 2 (two) times daily.     HYDROcodone-acetaminophen 7.5-325 MG per tablet  Commonly known as:  NORCO  Take 1 tablet by mouth every 6 (six) hours as needed for pain.     HYDROcodone-homatropine 5-1.5 MG/5ML syrup  Commonly known as:  HYCODAN  Take 5 mLs by mouth every 8 (eight) hours as needed for cough. For cough.     magic mouthwash Soln  Take 2 mLs by mouth  2 (two) times daily. Take until gone.     montelukast 10 MG tablet  Commonly known as:  SINGULAIR  Take 1 tablet (10 mg total) by mouth at bedtime.     pantoprazole 40 MG tablet  Commonly known as:  PROTONIX  Take 1 tablet (40 mg total) by mouth daily.     potassium chloride SA 20 MEQ tablet  Commonly known as:  K-DUR,KLOR-CON  Take 0.5 tablets (10 mEq total) by mouth 2 (two) times daily.     predniSONE 20 MG tablet  Commonly known as:  DELTASONE  Take 2 tablets (40 mg total) by mouth daily with breakfast.     tiotropium 18 MCG inhalation capsule  Commonly known as:  SPIRIVA  Place 1 capsule (18 mcg total) into inhaler and inhale daily.        Disposition and follow-up:   [name] was discharged from [facility] in Stable condition.  At the hospital follow up visit please address breathing status and volume status. If euvolemic please adjust her lasix back down to home dose.   Follow-up Appointments:     Follow-up Information   Follow up with [facility]. (The clinic  will call on Monday with your appointment date and time. )    Contact information:   [address] [phone] [place] [address] [phone]     Discharge Orders   Future Orders Complete By Expires     (HEART FAILURE PATIENTS) Call MD:  Anytime you have any of the following symptoms: 1) 3 pound weight gain in 24 hours or 5 pounds in 1 week 2) shortness of breath, with or without a dry hacking cough 3) swelling in the hands, feet or stomach 4) if you have to sleep on extra pillows at night in order to breathe.  As directed     Call MD for:  temperature >100.4  As directed     Diet - low sodium heart healthy  As directed     Increase activity slowly  As directed        Consultations:  None  Procedures Performed:  Dg Chest 2 View  07/14/2012   *RADIOLOGY REPORT*  Clinical Data: Shortness of breath and hypertension  CHEST - 2 VIEW  Comparison:   July 13, 2012  Findings:  There is minimal scarring in the lower lobes. There is mild central peribronchial thickening.  The nodular opacity seen recently on the left is not seen on this examination.  There is no edema or consolidation.  Heart is upper normal in size with normal pulmonary vascularity.  No adenopathy. There are no appreciable bone lesions.  IMPRESSION:   Evidence of a degree of a bronchitis.  No edema or consolidation.  The nodular opacity seen recently on the left is not seen at this time.   Original Report Authenticated By: Bretta Bang, M.D.   2D ECHO:  Study Conclusions  - Left ventricle: The cavity size was normal. Wall thickness was normal. Systolic function was normal. The estimated ejection fraction was in the range of 60% to 65%. Wall motion was normal; there were no regional wall motion abnormalities. - Right atrium: The atrium was mildly dilated. Transthoracic echocardiography. M-mode, complete 2D, spectral Doppler, and color Doppler. Height: Height: 160cm. Height: 63in. Weight: Weight: 98kg. Weight: 215.6lb. Body mass index: BMI: 38.3kg/m^2. Body surface area: BSA: 2.47m^2. Blood pressure: 107/55. Patient status: Inpatient. Location: Echo laboratory.  Admission HPI:  Patient is 67 year old lady with past medical history of COPD, congestive heart failure, A. fib (not on Coumadin), who presents with worsening shortness of breath and bilateral leg edema.  Patient has congestive heart failure with an EF of 60% by 2-D echo on 11/07/08 (most likely diastolic dysfunction). She is taking Lasix 40 mg daily at home. Recently she noticed worsening of bilateral leg edema. She was advised by her doctor to increase Lasix dose from 40 mg daily to 40 mg twice a day yesterday, but she did not have time to do it yet. Because of worsening shortness of breath, she comes to the ED today. Patient reports that in the past 3-5 days, her leg edema has been getting worse and her shortness of  breath is also getting worse. She has PNA and orthopnea, but no chest pain or palpitation.  Patient reports that she has chronic cough with pale green colored sputum because of COPD. Although her shortness of breath is getting worse, her productive cough remaining at her baseline per patient.  Of note, patient carries the diagnosis of A. fib, she's not taking Coumadin, in steady, she is taking high dose of aspirin (650 mg daily). She has mild skin bruises chronically, but no obvious bleeding  tendency. She is very concerned that she could bleed into her brain if she took Coumadin for her A. Fib.  Denies fever, chills, chest pain, abdominal pain,diarrhea, constipation, dysuria, urgency, frequency, hematuria.   Hospital Course by problem list:  CHF exacerbation - The patient did come in with volume overload causing diastolic CHF exacerbation. Given that she had not had echo in 4 years it was repeated to confirm no additional systolic dysfuction and was found to be very similar to old ECHO. She was diuresed with IV lasix and was converted to PO lasix with good success. During the course of the stay she was found to have COPD exacerbation as well and may have caused the CHF exacerbation. No known trigger of medication non-compliance, dietary indiscretions. She was discharged home on double her usual home dose to further diurese her although she was able to ambulate 100 feet with minimal dyspnea on discharge. Her lung exam had improved and was sounding fairly normal with minimal wheezing on discharge.   HYPERTENSION - blood pressures were well controlled during this hospital stay and she was maintained on her home regimen.  Atrial fibrillation without RVR- She declines coumadin as an out-patient and insists that she would have a complication such as brain bleed although she meets risk criteria for coumadin therapy. Stable and rate controlled during this admission.   COPD - with acute exacerbation during this  admission. She has several days of prednisone and doxycycline and was maintained on her home inhalers which were resumed on discharge. She did not have signs of an acute pneumonia.   Discharge Vitals:  BP 130/65  Pulse 80  Temp(Src) 98.1 F (36.7 C) (Oral)  Resp 16  Ht 5\' 3"  (1.6 m)  Wt 214 lb 1.6 oz (97.115 kg)  BMI 37.94 kg/m2  SpO2 98%  Discharge Labs: No results found for this or any previous visit (from the past 24 hour(s)).  SignedGenella Mech 07/17/2012, 9:39 AM   Time Spent on Discharge: 15 minutes Services Ordered on Discharge: none Equipment Ordered on Discharge: none

## 2012-07-17 NOTE — Progress Notes (Signed)
Subjective:    Pt complains of cough still minimally productive and better breathing this morning. She is still having some extra fluid and is having good urine output. She is not having fevers, chills. She is able to ambulate around the room with no to minimal dyspnea. She denies SOB and chest pain. No nausea, vomiting, diarrhea.   Interval Events: No acute events.    Objective:    Vital Signs:   Temp:  [97.8 F (36.6 C)-98.1 F (36.7 C)] 98.1 F (36.7 C) (06/08 0555) Pulse Rate:  [80-86] 80 (06/08 0909) Resp:  [16-22] 16 (06/08 0909) BP: (103-130)/(58-65) 130/65 mmHg (06/08 0555) SpO2:  [94 %-98 %] 98 % (06/08 0555) Weight:  [214 lb 1.6 oz (97.115 kg)] 214 lb 1.6 oz (97.115 kg) (06/08 0555) Last BM Date: 07/15/12  24-hour weight change: Weight change: -1 lb 4.6 oz (-0.585 kg)  Intake/Output:   Intake/Output Summary (Last 24 hours) at 07/17/12 0938 Last data filed at 07/17/12 0981  Gross per 24 hour  Intake   1180 ml  Output   3100 ml  Net  -1920 ml      Physical Exam: General: Vital signs reviewed and noted. Well-developed, well-nourished, in no acute distress; alert, appropriate and cooperative throughout examination.  Lungs:  Normal respiratory effort. Clear to auscultation BL without crackles or wheezes.  Heart: RRR. S1 and S2 normal without gallop, murmur, or rubs.  Abdomen:  BS normoactive. Soft, Nondistended, non-tender.  No masses or organomegaly.  Extremities: No pretibial edema.     Labs:  Basic Metabolic Panel:  Recent Labs Lab 07/13/12 1618 07/14/12 1434 07/15/12 0505 07/16/12 0920  NA 140 142 141 135  K 3.7 3.5 3.4* 4.1  CL 101 101 101 95*  CO2 30 32 33* 30  GLUCOSE 90 115* 102* 73  BUN 18 14 9 11   CREATININE 0.90 0.99 0.65 0.70  CALCIUM 9.2 9.1 9.0 9.0  MG  --   --  2.0  --     Liver Function Tests:  Recent Labs Lab 07/13/12 1618  AST 16  ALT 11  ALKPHOS 104  BILITOT 0.3  PROT 7.0  ALBUMIN 3.5   CBC:  Recent Labs Lab  07/13/12 1618 07/14/12 1434 07/15/12 0505  WBC 13.8* 12.0* 8.2  HGB 12.5 12.8 11.6*  HCT 38.7 39.5 36.1  MCV 99.0 99.2 98.6  PLT 229 251 215    Cardiac Enzymes:  Recent Labs Lab 07/13/12 1618 07/15/12 1024  TROPONINI <0.30 <0.30    Microbiology: Results for orders placed during the hospital encounter of 12/28/08  CULTURE, BLOOD (ROUTINE X 2)     Status: None   Collection Time    12/28/08  4:22 PM      Result Value Range Status   Specimen Description BLOOD RIGHT ARM   Final   Special Requests BOTTLES DRAWN AEROBIC AND ANAEROBIC 10CC   Final   Culture NO GROWTH 5 DAYS   Final   Report Status 01/04/2009 FINAL   Final  CULTURE, BLOOD (ROUTINE X 2)     Status: None   Collection Time    12/28/08  4:37 PM      Result Value Range Status   Specimen Description BLOOD LEFT ARM   Final   Special Requests BOTTLES DRAWN AEROBIC AND ANAEROBIC 10CC   Final   Culture NO GROWTH 5 DAYS   Final   Report Status 01/04/2009 FINAL   Final  CULTURE, SPUTUM-ASSESSMENT     Status: None  Collection Time    12/29/08  5:46 AM      Result Value Range Status   Specimen Description SPUTUM   Final   Special Requests AVELOX IMMUNE:NORM   Final   Sputum evaluation     Final   Value: MICROSCOPIC FINDINGS SUGGEST THAT THIS SPECIMEN IS NOT REPRESENTATIVE OF LOWER RESPIRATORY SECRETIONS. PLEASE RECOLLECT.     CALLED TO [facility] [phone] 12/29/08 M.CAMPBELL   Report Status 12/29/2008 FINAL   Final    Imaging: No results found.     Medications:    Infusions:    Scheduled Medications: . aspirin  650 mg Oral Daily  . budesonide-formoterol  2 puff Inhalation BID  . diltiazem  420 mg Oral Daily  . doxycycline  100 mg Oral Q12H  . FLUoxetine  60 mg Oral Daily  . furosemide  40 mg Oral BID  . heparin  5,000 Units Subcutaneous Q8H  . loratadine  10 mg Oral Daily  . montelukast  10 mg Oral QHS  . pantoprazole  40 mg Oral Daily  . predniSONE  40 mg Oral Q breakfast  . sodium chloride  3 mL  Intravenous Q12H  . sodium chloride  3 mL Intravenous Q12H  . tiotropium  18 mcg Inhalation Daily    PRN Medications: sodium chloride, albuterol, diazepam, HYDROcodone-acetaminophen, sodium chloride   Assessment/ Plan:   Pt is a 67 y.o. yo female with a PMHx of CHF, COPD who was admitted on 07/14/2012 with symptoms of SOB, lower extremity edema, which was determined to be secondary to CHF exacerbation.   Acute CHF exacerbation - Pt's signs/symptoms consistent with CHF exacerbation. ProBNP = 1190. Net I/O = -1.9L since admission. She appears to be having a COPD exacerbation concomitantly, which is the only identifiable explanation for her recent worsening of her CHF.  - cont PO lasix 40 mg BID on discharge until clinic follow up - 2d echo without change from previous echo about 4 years ago. Normal systolic function EF 60%.   Atrial fibrillation - No RVR since admission; rate currently 80s-90s. Pt remains in atrial fibrillation chronically. She has made an informed decision against warfarin anticoagulation due to bruising and concerns for intracranial/GI bleed. - cont ASA  - cont diltiazem (24hr) 420mg  QD   COPD - increased sputum production and purulence this AM. Also continues to be somewhat dyspneic despite diuresis, thus will treat for presumed COPD exacerbation at this time.  - doxycycline 100mg  bid x 5 days - prednisone 40mg  x 5 days - cont spiriva  - cont singulair  - cont albuterol nebs PRN   HTN - BP well-controlled on current regimen.  - cont diltiazem  - cont lasix   GERD - stable.  - cont protonix  Depression - stable.  - cont fluoxetine   Insomnia - stable.  - cont diazepam   DVT PPX - heparin  CODE STATUS - full CONSULTS PLACED - N/A DISPO - Disposition is deferred at this time, awaiting improvement of CHF exacerbation.  Anticipated discharge today. The patient does have a current PCP (PRIBULA,CHRISTOPHER, MD) and does need an [facility] hospital follow-up appointment  after discharge.  Is the [facility] hospital follow-up appointment a one-time only appointment? not applicable.  Does the patient have transportation limitations that hinder transportation to clinic appointments? no SERVICE NEEDED AT DISCHARGE - TO BE DETERMINED DURING HOSPITAL COURSE Y = Yes, Blank = No  PT:    OT:    RN:    Equipment:  Other:     Length of Stay: 3 day(s)  Signed: Judie Bonus, MD  PGY-2, Internal Medicine Resident 07/17/2012, 9:38 AM

## 2012-07-18 ENCOUNTER — Other Ambulatory Visit: Payer: Self-pay | Admitting: *Deleted

## 2012-07-18 ENCOUNTER — Telehealth: Payer: Self-pay | Admitting: *Deleted

## 2012-07-18 DIAGNOSIS — B370 Candidal stomatitis: Secondary | ICD-10-CM

## 2012-07-18 MED ORDER — NYSTATIN 100000 UNIT/ML MT SUSP: OROMUCOSAL | Status: DC

## 2012-07-18 NOTE — Telephone Encounter (Signed)
Can you send this to Dr. Dorise Hiss, thanks.

## 2012-07-18 NOTE — Telephone Encounter (Signed)
I can't find this order in the computer but you can call it in to her pharmacy.  Dr. Dorise Hiss

## 2012-07-18 NOTE — Telephone Encounter (Signed)
Pt called stating she she was d/c from hosptaal yesterday.   She was given a Rx for [employer] and her insurance will not pay for it. She is requesting a Rx for Nystatin swish and swallow. She has used before and it's covered by ins.  Will you send in a Rx for the Nystatin?

## 2012-07-21 ENCOUNTER — Encounter: Payer: Self-pay | Admitting: Internal Medicine

## 2012-07-21 ENCOUNTER — Ambulatory Visit (INDEPENDENT_AMBULATORY_CARE_PROVIDER_SITE_OTHER): Payer: Medicare Other | Admitting: Internal Medicine

## 2012-07-21 VITALS — BP 126/75 | HR 102 | Temp 98.3°F | Ht 65.5 in | Wt 217.1 lb

## 2012-07-21 DIAGNOSIS — J449 Chronic obstructive pulmonary disease, unspecified: Secondary | ICD-10-CM

## 2012-07-21 DIAGNOSIS — R05 Cough: Secondary | ICD-10-CM

## 2012-07-21 DIAGNOSIS — I10 Essential (primary) hypertension: Secondary | ICD-10-CM

## 2012-07-21 DIAGNOSIS — I509 Heart failure, unspecified: Secondary | ICD-10-CM

## 2012-07-21 MED ORDER — PANTOPRAZOLE SODIUM 40 MG PO TBEC: 40.0000 mg | DELAYED_RELEASE_TABLET | Freq: Two times a day (BID) | ORAL | Status: DC

## 2012-07-21 NOTE — Progress Notes (Signed)
Case discussed with Dr. Brown at the time of visit. We reviewed the resident's history and exam and pertinent patient test results. I agree with the assessment, diagnosis and plan of care documented in the resident's note. 

## 2012-07-21 NOTE — Assessment & Plan Note (Signed)
BP Readings from Last 3 Encounters:  07/21/12 126/75  07/17/12 136/69  07/13/12 115/66    Lab Results  Component Value Date   NA 135 07/16/2012   K 4.1 07/16/2012   CREATININE 0.70 07/16/2012    Assessment: Blood pressure control: controlled Progress toward BP goal:  at goal Comments: Well-controlled  Plan: Medications:  continue current medications Educational resources provided:   Self management tools provided:   Other plans: Recheck at next visit

## 2012-07-21 NOTE — Patient Instructions (Signed)
General Instructions: For your Heart Failure, continue to take Lasix 40 mg twice per day.  We are checking your kidney function and potassium level today, and if they are abnormal, we may call you and instruct you to lower the dose. -weigh yourself every day, and call our clinic if you gain more than 3 pounds in a day, or 5 pounds in 1 week  For your COPD, continue to complete your course of Prednisone and Doxycycline.  We have provided you with a letter for your housing situation.  Please return for a follow-up visit in 3 months.   Treatment Goals:  Goals (1 Years of Data) as of 07/21/12   None      Progress Toward Treatment Goals:  Treatment Goal 07/21/2012  Blood pressure at goal  Stop smoking -    Self Care Goals & Plans:  Self Care Goal 07/13/2012  Manage my medications take my medicines as prescribed; bring my medications to every visit; refill my medications on time; follow the sick day instructions if I am sick  Monitor my health keep track of my blood pressure  Eat healthy foods eat more vegetables; eat fruit for snacks and desserts; eat baked foods instead of fried foods; eat foods that are low in salt; eat smaller portions  Be physically active take a walk every day; find an activity I enjoy  Stop smoking -       Care Management & Community Referrals:  Referral 07/21/2012  Referrals made for care management support none needed  Referrals made to community resources -

## 2012-07-21 NOTE — Assessment & Plan Note (Addendum)
The patient presents for hospital follow-up after a CHF exacerbation.  Weight has increased by 3 pounds (?scale variation) since discharge, but the patient notes absolutely no symptoms.  Lasix was increased during her hospitalization. -continue lasix at the new dose of 40 mg BID -check BMET today for Cr and K -patient received a scale today, and was instructed to weigh herself daily  Addendum:  BUN and Cr have risen slightly.  Will instruct patient to return to lasix 40 mg PO daily.  Patient instructed to watch her weights carefully, and if they begin to increase, to call our clinic, as she may need to re-increase her lasix dosage.

## 2012-07-21 NOTE — Progress Notes (Signed)
HPI The patient is a 68 y.o. female with a history of CHF (EF 60-65%), COPD on chronic O2, HTN, afib, presenting for a hospital follow-up.  The patient was recently hospitalized 6/5-6/8 with a CHF exacerbation, complicated by COPD exacerbation.  The patient notes that her breathing has significant improved since hospital discharge.  She notes that she still has an occasional cough, but notes that it is at her baseline.  She notes no fever.  The patient notes no swelling in her legs, stable 2-pillow orthopnea, no PND.  The patient notes DOE at baseline, when walking down a hallway.  During her hospitalization, her weight decreased from 221-214.  Today it is 217.  The patient was discharged on Lasix 40 mg BID, which was increased from her prior dose of 40 mg daily.  The patient asks for a letter for her apartment complex asking for reasonable accommodation for power wheelchair, which she uses anywhere from 0-7 days/week, due to shortness of breath.  This was provided to the patient today.  ROS: General: no fevers, chills, changes in weight, changes in appetite Skin: no rash HEENT: no blurry vision, hearing changes, sore throat Pulm: no wheezing CV: no chest pain, palpitations, shortness of breath Abd: no abdominal pain, nausea/vomiting, diarrhea/constipation GU: no dysuria, hematuria, polyuria Ext: no arthralgias, myalgias Neuro: no weakness, numbness, or tingling  Filed Vitals:   07/21/12 1320  BP: 126/75  Pulse: 102  Temp: 98.3 F (36.8 C)    PEX General: alert, cooperative, and in no apparent distress HEENT: pupils equal round and reactive to light, vision grossly intact, oropharynx clear and non-erythematous  Neck: supple, no lymphadenopathy Lungs: normal work of respiration, minimal end-expiratory wheeze Heart: regular rate and rhythm, no murmurs, gallops, or rubs Abdomen: soft, non-tender, non-distended, normal bowel sounds Extremities: no cyanosis, clubbing, or  edema Neurologic: alert & oriented X3, cranial nerves II-XII intact, strength grossly intact, sensation intact to light touch  Current Outpatient Prescriptions on File Prior to Visit  Medication Sig Dispense Refill  . albuterol (PROVENTIL HFA;VENTOLIN HFA) 108 (90 BASE) MCG/ACT inhaler Inhale 2 puffs into the lungs every 6 (six) hours as needed for wheezing.  8.5 g  6  . Alum & Mag Hydroxide-Simeth (MAGIC MOUTHWASH) SOLN Take 2 mLs by mouth 2 (two) times daily. Take until gone.  15 mL  0  . aspirin 325 MG tablet Take 650 mg by mouth daily.       . budesonide-formoterol (SYMBICORT) 160-4.5 MCG/ACT inhaler Inhale 2 puffs into the lungs 2 (two) times daily.  10.2 g  11  . cetirizine (ZYRTEC) 10 MG tablet Take 10 mg by mouth 2 (two) times daily.      . diazepam (VALIUM) 2 MG tablet Take 1 tablet (2 mg total) by mouth every 12 (twelve) hours as needed for anxiety.  60 tablet  4  . diltiazem (TIAZAC) 420 MG 24 hr capsule Take 1 capsule (420 mg total) by mouth daily.  90 capsule  3  . doxycycline (VIBRA-TABS) 100 MG tablet Take 1 tablet (100 mg total) by mouth every 12 (twelve) hours.  10 tablet  0  . FLUoxetine (PROZAC) 20 MG capsule Take 3 capsules (60 mg total) by mouth daily.  90 capsule  6  . furosemide (LASIX) 40 MG tablet Take 1 tablet (40 mg total) by mouth 2 (two) times daily.  60 tablet  0  . HYDROcodone-acetaminophen (NORCO) 7.5-325 MG per tablet Take 1 tablet by mouth every 6 (six) hours as needed for  pain.  120 tablet  5  . HYDROcodone-homatropine (HYCODAN) 5-1.5 MG/5ML syrup Take 5 mLs by mouth every 8 (eight) hours as needed for cough. For cough.  480 mL  0  . montelukast (SINGULAIR) 10 MG tablet Take 1 tablet (10 mg total) by mouth at bedtime.  30 tablet  3  . nystatin (MYCOSTATIN) 100000 UNIT/ML suspension Swish and spit three times a day.  60 mL  0  . pantoprazole (PROTONIX) 40 MG tablet Take 1 tablet (40 mg total) by mouth daily.  30 tablet  6  . potassium chloride SA (K-DUR,KLOR-CON)  20 MEQ tablet Take 0.5 tablets (10 mEq total) by mouth 2 (two) times daily.  30 tablet  6  . predniSONE (DELTASONE) 20 MG tablet Take 2 tablets (40 mg total) by mouth daily with breakfast.  6 tablet  0  . tiotropium (SPIRIVA) 18 MCG inhalation capsule Place 1 capsule (18 mcg total) into inhaler and inhale daily.  90 capsule  3   No current facility-administered medications on file prior to visit.    Assessment/Plan

## 2012-07-21 NOTE — Assessment & Plan Note (Signed)
The patient is recovering well from her COPD exacerbation. -continue doxycycline and prednisone taper to completion -continue home O2

## 2012-07-22 LAB — BASIC METABOLIC PANEL
BUN: 29 mg/dL — ABNORMAL HIGH (ref 6–23)
CO2: 30 mEq/L (ref 19–32)
Chloride: 96 mEq/L (ref 96–112)
Creat: 1.03 mg/dL (ref 0.50–1.10)

## 2012-07-25 MED ORDER — FUROSEMIDE 40 MG PO TABS: 40.0000 mg | ORAL_TABLET | Freq: Every day | ORAL | Status: DC

## 2012-07-25 NOTE — Addendum Note (Signed)
Addended by: Linward Headland on: 07/25/2012 06:06 PM   Modules accepted: Orders

## 2012-07-28 ENCOUNTER — Telehealth: Payer: Self-pay | Admitting: *Deleted

## 2012-07-28 MED ORDER — FUROSEMIDE 40 MG PO TABS: 40.0000 mg | ORAL_TABLET | Freq: Two times a day (BID) | ORAL | Status: DC

## 2012-07-28 NOTE — Telephone Encounter (Signed)
Pt called to report a weight gain of 4 pounds in 2 days. She is taking lasix 40 mg once a day since Tuesday. She has mild SOB, increase with ambulation. Not feeling great. Mild swelling to feet and ankles, worse at night.  Pt # [id number]  Please advise. Pt was on lasix bid but was called and asked to decrease to once daily.

## 2012-07-28 NOTE — Telephone Encounter (Signed)
I returned the patient's phone call.  She confirms that she has gained 4 pounds in 2 days.  I had decreased her Lasix from 40 BID to 40 daily based on a BMET at her last visit, where her Cr increased from 0.7 to 1.03.  However, in light of her weight gain, it appears she will require a higher dose of lasix.  I instructed the patient to increase lasix to 40 mg BID.  The patient is to follow-up in 32-month, for an office visit and BMET.  The patient expressed understanding and agreement.  Awanda Mink 07/28/2012, 11:00 AM

## 2012-08-01 ENCOUNTER — Telehealth: Payer: Self-pay | Admitting: *Deleted

## 2012-08-01 NOTE — Telephone Encounter (Signed)
Pt called - frustrated - feet still swollen, wt is the same and still has SOB. No better or worse,  Saw Dr Manson Passey 07/21/12. Pt states finish with Doxycycline and Prednisone. On Lasix 40mg  twice a day. Dr Manson Passey aware to reck this week. Ellwood Dense will call pt for appt this week - pt aware. Stanton Kidney Staton Markey RN 08/01/12 10AM

## 2012-08-02 ENCOUNTER — Encounter: Payer: Self-pay | Admitting: Internal Medicine

## 2012-08-03 ENCOUNTER — Encounter: Payer: Self-pay | Admitting: Internal Medicine

## 2012-08-03 ENCOUNTER — Ambulatory Visit (INDEPENDENT_AMBULATORY_CARE_PROVIDER_SITE_OTHER): Payer: Medicare Other | Admitting: Internal Medicine

## 2012-08-03 VITALS — BP 102/64 | HR 74 | Temp 97.1°F | Ht 65.5 in | Wt 222.3 lb

## 2012-08-03 DIAGNOSIS — M169 Osteoarthritis of hip, unspecified: Secondary | ICD-10-CM

## 2012-08-03 DIAGNOSIS — I10 Essential (primary) hypertension: Secondary | ICD-10-CM

## 2012-08-03 DIAGNOSIS — M1990 Unspecified osteoarthritis, unspecified site: Secondary | ICD-10-CM

## 2012-08-03 DIAGNOSIS — M1610 Unilateral primary osteoarthritis, unspecified hip: Secondary | ICD-10-CM

## 2012-08-03 DIAGNOSIS — I428 Other cardiomyopathies: Secondary | ICD-10-CM

## 2012-08-03 LAB — BASIC METABOLIC PANEL
BUN: 17 mg/dL (ref 6–23)
Creat: 0.92 mg/dL (ref 0.50–1.10)

## 2012-08-03 MED ORDER — ZOLPIDEM TARTRATE 5 MG PO TABS: 5.0000 mg | ORAL_TABLET | Freq: Every evening | ORAL | Status: DC | PRN

## 2012-08-03 MED ORDER — HYDROCODONE-ACETAMINOPHEN 7.5-325 MG PO TABS: 1.0000 | ORAL_TABLET | Freq: Four times a day (QID) | ORAL | Status: DC | PRN

## 2012-08-03 MED ORDER — FUROSEMIDE 40 MG PO TABS: 80.0000 mg | ORAL_TABLET | Freq: Two times a day (BID) | ORAL | Status: DC

## 2012-08-03 NOTE — Assessment & Plan Note (Signed)
BP Readings from Last 3 Encounters:  08/03/12 102/64  07/21/12 126/75  07/17/12 136/69    Lab Results  Component Value Date   NA 140 07/21/2012   K 3.7 07/21/2012   CREATININE 1.03 07/21/2012    Assessment: Blood pressure control: controlled Progress toward BP goal:  at goal Comments: Well-controlled  Plan: Medications:  continue current medications Educational resources provided:   Self management tools provided: home blood pressure logbook Other plans: Recheck at next visit

## 2012-08-03 NOTE — Progress Notes (Signed)
HPI The patient is a 67 y.o. female with a history of diastolic CHF, COPD, HTN, tobacco abuse, presenting for a follow-up visit.  The patient was seen 6/12 for a hospital follow-up for CHF and COPD exacerbation.  The patient notes increased LE edema since our last visit.  The patient notes 4-pillow orthopnea, increased from her baseline of 2-pillow.  The patient awakens frequently at night with cough.  The patient has been weighing herself at home, and has noted a 6-pound weight gain since her last visit.  The patient was on lasix 40 daily for 3 days after her last visit, due to a slight bump in Cr, but it was then increased back to 40 mg BID, which she has been taking since that time.  The patient notes increased cough, occasionally productive of clear/light green sputum, which is a change from her baseline.  The patient notes dyspnea with exertion, but not particularly at rest.  The patient has completed her course of doxycycline and prednisone.  She notes no fevers, but does note chronic nasal congestion.  The patient continues to smoke, though notes that it is < 1 cigarette per day.  The patient is not interested in quitting today.  ROS: General: no fevers, chills, changes in appetite Skin: no rash HEENT: no blurry vision, hearing changes, sore throat Pulm: see HPI CV: no chest pain, palpitations Abd: no abdominal pain, nausea/vomiting, diarrhea/constipation GU: no dysuria, hematuria, polyuria Ext: no arthralgias, myalgias Neuro: no weakness, numbness, or tingling  Filed Vitals:   08/03/12 1007  BP: 102/64  Pulse: 74  Temp: 97.1 F (36.2 C)    PEX General: alert, cooperative, and in no apparent distress HEENT: pupils equal round and reactive to light, vision grossly intact, oropharynx clear and non-erythematous  Neck: supple, no lymphadenopathy, +JVD Lungs: normal work of respiration, mild crackles at bilateral bases Heart: regular rate and rhythm, no murmurs, gallops, or  rubs Abdomen: soft, non-tender, non-distended, normal bowel sounds Extremities: 2+ bilateral pitting edema Neurologic: alert & oriented X3, cranial nerves II-XII intact, strength grossly intact, sensation intact to light touch  Current Outpatient Prescriptions on File Prior to Visit  Medication Sig Dispense Refill  . albuterol (PROVENTIL HFA;VENTOLIN HFA) 108 (90 BASE) MCG/ACT inhaler Inhale 2 puffs into the lungs every 6 (six) hours as needed for wheezing.  8.5 g  6  . Alum & Mag Hydroxide-Simeth (MAGIC MOUTHWASH) SOLN Take 2 mLs by mouth 2 (two) times daily. Take until gone.  15 mL  0  . aspirin 325 MG tablet Take 650 mg by mouth daily.       . budesonide-formoterol (SYMBICORT) 160-4.5 MCG/ACT inhaler Inhale 2 puffs into the lungs 2 (two) times daily.  10.2 g  11  . cetirizine (ZYRTEC) 10 MG tablet Take 10 mg by mouth 2 (two) times daily.      . diazepam (VALIUM) 2 MG tablet Take 1 tablet (2 mg total) by mouth every 12 (twelve) hours as needed for anxiety.  60 tablet  4  . diltiazem (TIAZAC) 420 MG 24 hr capsule Take 1 capsule (420 mg total) by mouth daily.  90 capsule  3  . doxycycline (VIBRA-TABS) 100 MG tablet Take 1 tablet (100 mg total) by mouth every 12 (twelve) hours.  10 tablet  0  . FLUoxetine (PROZAC) 20 MG capsule Take 3 capsules (60 mg total) by mouth daily.  90 capsule  6  . furosemide (LASIX) 40 MG tablet Take 1 tablet (40 mg total) by mouth 2 (  two) times daily.  60 tablet  11  . HYDROcodone-acetaminophen (NORCO) 7.5-325 MG per tablet Take 1 tablet by mouth every 6 (six) hours as needed for pain.  120 tablet  5  . HYDROcodone-homatropine (HYCODAN) 5-1.5 MG/5ML syrup Take 5 mLs by mouth every 8 (eight) hours as needed for cough. For cough.  480 mL  0  . montelukast (SINGULAIR) 10 MG tablet Take 1 tablet (10 mg total) by mouth at bedtime.  30 tablet  3  . nystatin (MYCOSTATIN) 100000 UNIT/ML suspension Swish and spit three times a day.  60 mL  0  . pantoprazole (PROTONIX) 40 MG  tablet Take 1 tablet (40 mg total) by mouth 2 (two) times daily.  60 tablet  11  . potassium chloride SA (K-DUR,KLOR-CON) 20 MEQ tablet Take 0.5 tablets (10 mEq total) by mouth 2 (two) times daily.  30 tablet  6  . predniSONE (DELTASONE) 20 MG tablet Take 2 tablets (40 mg total) by mouth daily with breakfast.  6 tablet  0  . tiotropium (SPIRIVA) 18 MCG inhalation capsule Place 1 capsule (18 mcg total) into inhaler and inhale daily.  90 capsule  3   No current facility-administered medications on file prior to visit.    Assessment/Plan

## 2012-08-03 NOTE — Assessment & Plan Note (Signed)
The patient has a history of chronic pain, due to DJD.  She has an existing pain contract.  Pain medication refilled today

## 2012-08-03 NOTE — Assessment & Plan Note (Addendum)
The patient has a history of diastolic CHF.  She notes weight gain, LE edema, and increased orthopnea.  Symptoms consistent with CHF exacerbation.  Less likely COPD exacerbation, though patient does have increased cough, and questionable change in sputum production from baseline -increase lasix from 40 mg BID to 80 mg BID -check BMET today -patient to call in 2-day to report weight and symptoms, then follow-up in clinic within 1-2 weeks -consider COPD exacerbation further if patient notes no relief in symptoms with diuresis

## 2012-08-03 NOTE — Patient Instructions (Signed)
General Instructions: The swelling in your legs and your difficulty breathing is likely due to your heart failure -increase your Lasix medication to 2 tablets (80 mg total) twice per day -call our office in 2 days to report how your weights have changed.  Please return for a follow-up visit in 2 weeks.  If, however, you continue to gain weight, you may need to be seen sooner, such as later this week or early next week.   Treatment Goals:  Goals (1 Years of Data) as of 08/03/12   None      Progress Toward Treatment Goals:  Treatment Goal 08/03/2012  Blood pressure at goal  Stop smoking smoking the same amount    Self Care Goals & Plans:  Self Care Goal 08/03/2012  Manage my medications take my medicines as prescribed; refill my medications on time  Monitor my health keep track of my blood pressure  Eat healthy foods eat more vegetables; eat foods that are low in salt; eat baked foods instead of fried foods; eat fruit for snacks and desserts; eat smaller portions  Be physically active (No Data)  Stop smoking (No Data)       Care Management & Community Referrals:  Referral 08/03/2012  Referrals made for care management support none needed  Referrals made to community resources -

## 2012-08-05 ENCOUNTER — Telehealth: Payer: Self-pay | Admitting: *Deleted

## 2012-08-05 NOTE — Telephone Encounter (Signed)
I called the patient.  She notes that her weight has been stable since our visit, currently 221 today.  Our last weight in clinic was 222.  It's encouraging that her weight is not increasing, but she needs diuresis.  As such, I'm increasing her lasix from 80 mg BID, to 80 mg every morning, 40 mg around mid-day, and 80 mg every afternoon.  The patient is to follow-up in clinic in about 1 week.  The patient expressed understanding.

## 2012-08-05 NOTE — Telephone Encounter (Signed)
Pt calls and states she is no better nor no worse, states dr brown told her to call

## 2012-08-07 NOTE — Assessment & Plan Note (Signed)
She is hesitant to do a colonoscopy.  We wil start with FOBT cards today and follow up on the results of those.

## 2012-08-07 NOTE — Assessment & Plan Note (Signed)
She states that she is having a good day today and that her breathing is doing well.  She is very excited that she was able to have a cold a few weeks ago but did not have an exacerbation of her COPD.  She using her medications as prescribed and is working on quitting smoking. Kelsey Garcia

## 2012-08-07 NOTE — Assessment & Plan Note (Signed)
She continues to work on quitting cigarettes and is down to <5 per day.  She states that someday are worse then others.  We discussed several options to work on quitting completely including setting a place to smoke, preferably away from her oxygen supply, as well as delaying that first cigarette in the morning.

## 2012-08-07 NOTE — Assessment & Plan Note (Signed)
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  No red flags today.  Will refil her medications.

## 2012-08-07 NOTE — Assessment & Plan Note (Signed)
She states that she is taking her medications as prescribed and feels great.  She is working on cutting down here smoking and is currently down to 5 cigarettes per day.    BP Readings from Last 10 Encounters:  12/25/11 118/72  11/12/11 129/76  10/30/11 128/77   BLood pressure is well controlled.  We will continue her current medications of diltiazem 240 mg daily.

## 2012-08-07 NOTE — Assessment & Plan Note (Signed)
Her last mammogram was 2010 and was normal.  We will schedule her for a follow up today.

## 2012-08-07 NOTE — Assessment & Plan Note (Signed)
She saw Dr. Lazarus Salines after our last appointment and states that he helped a lot.  He increased her protonix to 40 mg twice daily and states that the hycodan is a good medication to help her chronic cough.  She is okay with having to add it to the medication contract.  She will continue to work to quit smoking he told her that it would be the best thing to help her chronic cough.

## 2012-08-07 NOTE — Assessment & Plan Note (Addendum)
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  She takes her pain medication twice daily and occasionally has to take a 3rd tablet per day, no more then 6-7 times per month.  We will refill her pain medication today. I encouraged her to continue working to get more active and we will give her some instructions today for hip and back strengthening exercises.

## 2012-08-07 NOTE — Assessment & Plan Note (Signed)
Ambulatory saturation on 1.5L of oxygen was >90%.  RA resting sats are >88%.  Ambulatory saturations showed a dip to 85% on RA with marked dyspnea per patient report.  This returned to normal with rest and supplementary oxygen.    She has been taking her protonix and hycodan as prescribed and actually has been able to cut back the hycodan with the protonix and Claritin.

## 2012-08-07 NOTE — Assessment & Plan Note (Signed)
She states she is taking her medications and is working on getting more active.    BP Readings from Last 15 Encounters:  02/08/12 128/78  12/25/11 118/72  11/12/11 129/76   Her blood pressure is doing well today.  WE will continue her home medications for now.

## 2012-08-10 NOTE — Progress Notes (Signed)
Case discussed with Dr. Manson Passey at the time of the visit.  We reviewed the resident's history and exam and pertinent patient test results.  I agree with the assessment, diagnosis, and plan of care documented in the resident's note.  I agree with plan of increased diuretics and close follow up.  This patient has a high chance of needing admission if fluids cannot be gotten under control in the outpatient setting.

## 2012-08-18 ENCOUNTER — Encounter: Payer: Self-pay | Admitting: Internal Medicine

## 2012-08-18 ENCOUNTER — Ambulatory Visit (INDEPENDENT_AMBULATORY_CARE_PROVIDER_SITE_OTHER): Payer: Medicare Other | Admitting: Internal Medicine

## 2012-08-18 ENCOUNTER — Telehealth: Payer: Self-pay | Admitting: Internal Medicine

## 2012-08-18 ENCOUNTER — Other Ambulatory Visit: Payer: Self-pay | Admitting: Internal Medicine

## 2012-08-18 VITALS — BP 125/65 | HR 75 | Temp 97.6°F | Wt 217.4 lb

## 2012-08-18 DIAGNOSIS — J4489 Other specified chronic obstructive pulmonary disease: Secondary | ICD-10-CM

## 2012-08-18 DIAGNOSIS — I428 Other cardiomyopathies: Secondary | ICD-10-CM

## 2012-08-18 DIAGNOSIS — J441 Chronic obstructive pulmonary disease with (acute) exacerbation: Secondary | ICD-10-CM

## 2012-08-18 DIAGNOSIS — F17200 Nicotine dependence, unspecified, uncomplicated: Secondary | ICD-10-CM

## 2012-08-18 DIAGNOSIS — I10 Essential (primary) hypertension: Secondary | ICD-10-CM

## 2012-08-18 DIAGNOSIS — J449 Chronic obstructive pulmonary disease, unspecified: Secondary | ICD-10-CM

## 2012-08-18 MED ORDER — DOXYCYCLINE HYCLATE 100 MG PO CAPS: 100.0000 mg | ORAL_CAPSULE | Freq: Two times a day (BID) | ORAL | Status: DC

## 2012-08-18 MED ORDER — PREDNISONE 20 MG PO TABS: 20.0000 mg | ORAL_TABLET | Freq: Every day | ORAL | Status: DC
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
## 2012-08-18 NOTE — Assessment & Plan Note (Signed)
Appears to be back to dry weight after extensive diuresis by Dr. Manson Passey.  Today's weight 217 and lowest weight recently appears to be 214.  Was 222lbs on 6/25 last seen in opc.  Has been taking lasix 80mg  in AM, 40mg  in afternoon, and 80mg  in PM and reports much improvement in lower extremity edema and constant urination which is making her very tired.  She hopes to decrease her lasix today. Continues to complain of SOB and DOE more than usual however.  Continues to have cough but says not different than usual.  On continuous home o2 2L. Continues to smoke cigarettes.  -will treat for mild COPD exacerbation at this time -decrease lasix down to 80mg  bid -cbc, cmet needs to be done, she was in a hurry to leave to catch transportation, will need to return for labs to be drawn

## 2012-08-18 NOTE — Assessment & Plan Note (Signed)
  Assessment: Progress toward smoking cessation:  smoking less Barriers to progress toward smoking cessation:  lack of motivation to quit Comments: continues to smoke, says down to 1 cig maybe a week. Did smoke this morning.   Plan: Instruction/counseling given:  I counseled patient on the dangers of tobacco use, advised patient to stop smoking, and reviewed strategies to maximize success. Educational resources provided:    Self management tools provided:    Medications to assist with smoking cessation:  None Patient agreed to the following self-care plans for smoking cessation: call [facility] (1-800-QUIT-NOW)  Other plans: strongly encouraged to quit especially given severity of SOB and COPD and heart failure problems

## 2012-08-18 NOTE — Assessment & Plan Note (Signed)
BP Readings from Last 3 Encounters:  08/18/12 125/65  08/03/12 102/64  07/21/12 126/75   Lab Results  Component Value Date   NA 142 08/03/2012   K 3.5 08/03/2012   CREATININE 0.92 08/03/2012   Assessment: Blood pressure control: controlled Progress toward BP goal:  at goal Comments: on lasix 80mg  in AM, 40mg  in afternoon, and 80 mg in PM  Plan: Medications:  decrease lasix back down to 80mg  bid continue diltiazem

## 2012-08-18 NOTE — Telephone Encounter (Signed)
I called Kelsey Garcia this evening since she was in such a hurry to leave this afternoon from clinic.  I confirmed our plan to start doxycyline and prednisone as prescribed for 5 days that has been sent to her pharmacy already and to decrease lasix to 80mg  bid.  She is advised to check her weight and monitor for swelling and if worsening of swelling, SOB, fever, chills, nausea, vomiting, or chest pain, she needs to go directly to ED.  She voices understanding and says she will go to ED and follow up as necessary.   I also informed her that she needs to return for blood work since she had to leave so fast today that she was unable to go by the lab.  She says she cannot come tomorrow due to transportation limitations, but that she will come by on Monday.    She thanked me for the call and says she will follow up.

## 2012-08-18 NOTE — Patient Instructions (Signed)
Please return for your blood work as soon as possible  Decrease your lasix to 80mg  twice a day  Start doxycycline and prednisone daily x5 days  If you notice worsening of your breathing, cough, chest pain, nausea, vomiting, fever, or chills, you need to go directly to ED

## 2012-08-18 NOTE — Assessment & Plan Note (Signed)
On continuous 2L [place] O2.  Smoked 1 cigarette this morning.  Has chronic cough with occasional white -light green sputum.  But is complaining of severe DOE that has not improved despite diuresis since last hospital admission.  Denies fever or chills.  No wheezing on physical exam but does have bibasilar crackles. ?if currently in mild exacerbation causing SOB more than usual.    -decrease lasix back down to 80mg  bid -doxycycline and prednisone x5 days -follow up closely in clinic -advised in worsening sob, fever, chills, chest pain, N/V/D to notify us right away and go to ED if severe

## 2012-08-19 NOTE — Progress Notes (Signed)
Case discussed with Dr. Qureshi at the time of the visit.  We reviewed the resident's history and exam and pertinent patient test results.  I agree with the assessment, diagnosis, and plan of care documented in the resident's note. 

## 2012-08-22 ENCOUNTER — Encounter: Payer: Self-pay | Admitting: Internal Medicine

## 2012-08-22 ENCOUNTER — Ambulatory Visit (INDEPENDENT_AMBULATORY_CARE_PROVIDER_SITE_OTHER): Payer: Medicare Other | Admitting: Internal Medicine

## 2012-08-22 VITALS — BP 116/69 | HR 82 | Temp 98.1°F | Ht 65.0 in | Wt 216.5 lb

## 2012-08-22 DIAGNOSIS — F17200 Nicotine dependence, unspecified, uncomplicated: Secondary | ICD-10-CM

## 2012-08-22 DIAGNOSIS — I5032 Chronic diastolic (congestive) heart failure: Secondary | ICD-10-CM

## 2012-08-22 DIAGNOSIS — J449 Chronic obstructive pulmonary disease, unspecified: Secondary | ICD-10-CM

## 2012-08-22 DIAGNOSIS — I10 Essential (primary) hypertension: Secondary | ICD-10-CM

## 2012-08-22 MED ORDER — PREDNISONE 20 MG PO TABS: ORAL_TABLET | ORAL | Status: DC

## 2012-08-22 MED ORDER — FLUCONAZOLE 200 MG PO TABS: 200.0000 mg | ORAL_TABLET | Freq: Every day | ORAL | Status: DC
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
## 2012-08-22 NOTE — Assessment & Plan Note (Addendum)
Continues to complain of worsening DOE.  Has not completed current course of doxycycline and prednisone since started the course late.  Reports compliance with medications.  Continues to smoke with little motivation to quit.  Counseled EXTENSIVELY on cessation and strategies to quit today.  I think to be most successful, she will have to WANT to quit, which she doesn't at this time despite her breathing, AND her daughter will likely need to quit as well to reduce temptation in the house.  Used to follow with Dr. Vassie Loll, then switched to a provider in [place] but does not recall name. Worsening DOE does not appear to be a result of CHF exacerbation at this time given on the lower end of her dry weight and improved edema overall.    -long prednisone taper: 60mg  x3 days, 40mg  x3, 20mg x3, and then 10mg x3 -continue albuterol, symbicort, singulair, spiriva -diflucan x1 po tablet -monitor in opc closely, if worsening sob, chest pain, cough, or lower extremity edema, asked to call clinic or go to ED if severe -consider referral back to pulmonology for long term management given severity of disease. Perhaps repeat PFTs due to worsening symptoms.   -smoking cessation STRONGLY advised and counseling given

## 2012-08-22 NOTE — Progress Notes (Signed)
Case discussed with Dr. Qureshi (at time of visit, soon after the resident saw the patient).  We reviewed the resident's history and exam and pertinent patient test results.  I agree with the assessment, diagnosis, and plan of care documented in the resident's note.  

## 2012-08-22 NOTE — Patient Instructions (Signed)
General Instructions: PLEASE STOP SMOKING, we discussed several strategies today, please consider them and let us know if we can help.    Please call 1800quitnow for free nicotine replacement therapy assistance  Please start your prednisone taper: 60mg  x3 days, 40mg  x3 days, 20mg  x3 days, and 10mg  x3day   Please let us know if your breathing gets worse or if your swelling gets worse as well or if you have any chest pain, go to ED  Treatment Goals:  Goals (1 Years of Data) as of 08/22/12   None      Progress Toward Treatment Goals:  Treatment Goal 08/22/2012  Blood pressure at goal  Stop smoking smoking the same amount    Self Care Goals & Plans:  Self Care Goal 08/22/2012  Manage my medications -  Monitor my health -  Eat healthy foods -  Be physically active -  Stop smoking call [facility] (1-800-QUIT-NOW)    Care Management & Community Referrals:  Referral 08/03/2012  Referrals made for care management support none needed  Referrals made to community resources -     Smoking Cessation Quitting smoking is important to your health and has many advantages. However, it is not always easy to quit since nicotine is a very addictive drug. Often times, people try 3 times or more before being able to quit. This document explains the best ways for you to prepare to quit smoking. Quitting takes hard work and a lot of effort, but you can do it. ADVANTAGES OF QUITTING SMOKING  You will live longer, feel better, and live better.  Your body will feel the impact of quitting smoking almost immediately.  Within 20 minutes, blood pressure decreases. Your pulse returns to its normal level.  After 8 hours, carbon monoxide levels in the blood return to normal. Your oxygen level increases.  After 24 hours, the chance of having a heart attack starts to decrease. Your breath, hair, and body stop smelling like smoke.  After 48 hours, damaged nerve endings begin to recover. Your sense of  taste and smell improve.  After 72 hours, the body is virtually free of nicotine. Your bronchial tubes relax and breathing becomes easier.  After 2 to 12 weeks, lungs can hold more air. Exercise becomes easier and circulation improves.  The risk of having a heart attack, stroke, cancer, or lung disease is greatly reduced.  After 1 year, the risk of coronary heart disease is cut in half.  After 5 years, the risk of stroke falls to the same as a nonsmoker.  After 10 years, the risk of lung cancer is cut in half and the risk of other cancers decreases significantly.  After 15 years, the risk of coronary heart disease drops, usually to the level of a nonsmoker.  If you are pregnant, quitting smoking will improve your chances of having a healthy baby.  The people you live with, especially any children, will be healthier.  You will have extra money to spend on things other than cigarettes. QUESTIONS TO THINK ABOUT BEFORE ATTEMPTING TO QUIT You may want to talk about your answers with your caregiver.  Why do you want to quit?  If you tried to quit in the past, what helped and what did not?  What will be the most difficult situations for you after you quit? How will you plan to handle them?  Who can help you through the tough times? Your family? Friends? A caregiver?  What pleasures do you get from smoking?  What ways can you still get pleasure if you quit? Here are some questions to ask your caregiver:  How can you help me to be successful at quitting?  What medicine do you think would be best for me and how should I take it?  What should I do if I need more help?  What is smoking withdrawal like? How can I get information on withdrawal? GET READY  Set a quit date.  Change your environment by getting rid of all cigarettes, ashtrays, matches, and lighters in your home, car, or work. Do not let people smoke in your home.  Review your past attempts to quit. Think about what  worked and what did not. GET SUPPORT AND ENCOURAGEMENT You have a better chance of being successful if you have help. You can get support in many ways.  Tell your family, friends, and co-workers that you are going to quit and need their support. Ask them not to smoke around you.  Get individual, group, or telephone counseling and support. Programs are available at [employer] and health centers. Call your local health department for information about programs in your area.  Spiritual beliefs and practices may help some smokers quit.  Download a "quit meter" on your computer to keep track of quit statistics, such as how long you have gone without smoking, cigarettes not smoked, and money saved.  Get a self-help book about quitting smoking and staying off of tobacco. LEARN NEW SKILLS AND BEHAVIORS  Distract yourself from urges to smoke. Talk to someone, go for a walk, or occupy your time with a task.  Change your normal routine. Take a different route to work. Drink tea instead of coffee. Eat breakfast in a different place.  Reduce your stress. Take a hot bath, exercise, or read a book.  Plan something enjoyable to do every day. Reward yourself for not smoking.  Explore interactive web-based programs that specialize in helping you quit. GET MEDICINE AND USE IT CORRECTLY Medicines can help you stop smoking and decrease the urge to smoke. Combining medicine with the above behavioral methods and support can greatly increase your chances of successfully quitting smoking.  Nicotine replacement therapy helps deliver nicotine to your body without the negative effects and risks of smoking. Nicotine replacement therapy includes nicotine gum, lozenges, inhalers, nasal sprays, and skin patches. Some may be available over-the-counter and others require a prescription.  Antidepressant medicine helps people abstain from smoking, but how this works is unknown. This medicine is available by  prescription.  Nicotinic receptor partial agonist medicine simulates the effect of nicotine in your brain. This medicine is available by prescription. Ask your caregiver for advice about which medicines to use and how to use them based on your health history. Your caregiver will tell you what side effects to look out for if you choose to be on a medicine or therapy. Carefully read the information on the package. Do not use any other product containing nicotine while using a nicotine replacement product.  RELAPSE OR DIFFICULT SITUATIONS Most relapses occur within the first 3 months after quitting. Do not be discouraged if you start smoking again. Remember, most people try several times before finally quitting. You may have symptoms of withdrawal because your body is used to nicotine. You may crave cigarettes, be irritable, feel very hungry, cough often, get headaches, or have difficulty concentrating. The withdrawal symptoms are only temporary. They are strongest when you first quit, but they will go away within 10 14 days.  To reduce the chances of relapse, try to:  Avoid drinking alcohol. Drinking lowers your chances of successfully quitting.  Reduce the amount of caffeine you consume. Once you quit smoking, the amount of caffeine in your body increases and can give you symptoms, such as a rapid heartbeat, sweating, and anxiety.  Avoid smokers because they can make you want to smoke.  Do not let weight gain distract you. Many smokers will gain weight when they quit, usually less than 10 pounds. Eat a healthy diet and stay active. You can always lose the weight gained after you quit.  Find ways to improve your mood other than smoking. FOR MORE INFORMATION  www.smokefree.gov  Document Released: 01/20/2001 Document Revised: 07/28/2011 Document Reviewed: 05/07/2011 [facility] Patient Information 2014 [place], [place].  Smoking Cessation, Tips for Success YOU CAN QUIT SMOKING If you are ready to quit  smoking, congratulations! You have chosen to help yourself be healthier. Cigarettes bring nicotine, tar, carbon monoxide, and other irritants into your body. Your lungs, heart, and blood vessels will be able to work better without these poisons. There are many different ways to quit smoking. Nicotine gum, nicotine patches, a nicotine inhaler, or nicotine nasal spray can help with physical craving. Hypnosis, support groups, and medicines help break the habit of smoking. Here are some tips to help you quit for good.  Throw away all cigarettes.  Clean and remove all ashtrays from your home, work, and car.  On a card, write down your reasons for quitting. Carry the card with you and read it when you get the urge to smoke.  Cleanse your body of nicotine. Drink enough water and fluids to keep your urine clear or pale yellow. Do this after quitting to flush the nicotine from your body.  Learn to predict your moods. Do not let a bad situation be your excuse to have a cigarette. Some situations in your life might tempt you into wanting a cigarette.  Never have "just one" cigarette. It leads to wanting another and another. Remind yourself of your decision to quit.  Change habits associated with smoking. If you smoked while driving or when feeling stressed, try other activities to replace smoking. Stand up when drinking your coffee. Brush your teeth after eating. Sit in a different chair when you read the paper. Avoid alcohol while trying to quit, and try to drink fewer caffeinated beverages. Alcohol and caffeine may urge you to smoke.  Avoid foods and drinks that can trigger a desire to smoke, such as sugary or spicy foods and alcohol.  Ask people who smoke not to smoke around you.  Have something planned to do right after eating or having a cup of coffee. Take a walk or exercise to perk you up. This will help to keep you from overeating.  Try a relaxation exercise to calm you down and decrease your  stress. Remember, you may be tense and nervous for the first 2 weeks after you quit, but this will pass.  Find new activities to keep your hands busy. Play with a pen, coin, or rubber band. Doodle or draw things on paper.  Brush your teeth right after eating. This will help cut down on the craving for the taste of tobacco after meals. You can try mouthwash, too.  Use oral substitutes, such as lemon drops, carrots, a cinnamon stick, or chewing gum, in place of cigarettes. Keep them handy so they are available when you have the urge to smoke.  When you have the  urge to smoke, try deep breathing.  Designate your home as a nonsmoking area.  If you are a heavy smoker, ask your caregiver about a prescription for nicotine chewing gum. It can ease your withdrawal from nicotine.  Reward yourself. Set aside the cigarette money you save and buy yourself something nice.  Look for support from others. Join a support group or smoking cessation program. Ask someone at home or at work to help you with your plan to quit smoking.  Always ask yourself, "Do I need this cigarette or is this just a reflex?" Tell yourself, "Today, I choose not to smoke," or "I do not want to smoke." You are reminding yourself of your decision to quit, even if you do smoke a cigarette. HOW WILL I FEEL WHEN I QUIT SMOKING?  The benefits of not smoking start within days of quitting.  You may have symptoms of withdrawal because your body is used to nicotine (the addictive substance in cigarettes). You may crave cigarettes, be irritable, feel very hungry, cough often, get headaches, or have difficulty concentrating.  The withdrawal symptoms are only temporary. They are strongest when you first quit but will go away within 10 to 14 days.  When withdrawal symptoms occur, stay in control. Think about your reasons for quitting. Remind yourself that these are signs that your body is healing and getting used to being without  cigarettes.  Remember that withdrawal symptoms are easier to treat than the major diseases that smoking can cause.  Even after the withdrawal is over, expect periodic urges to smoke. However, these cravings are generally short-lived and will go away whether you smoke or not. Do not smoke!  If you relapse and smoke again, do not lose hope. Most smokers quit 3 times before they are successful.  If you relapse, do not give up! Plan ahead and think about what you will do the next time you get the urge to smoke. LIFE AS A NONSMOKER: MAKE IT FOR A MONTH, MAKE IT FOR LIFE Day 1: Hang this page where you will see it every day. Day 2: Get rid of all ashtrays, matches, and lighters. Day 3: Drink water. Breathe deeply between sips. Day 4: Avoid places with smoke-filled air, such as bars, clubs, or the smoking section of restaurants. Day 5: Keep track of how much money you save by not smoking. Day 6: Avoid boredom. Keep a good book with you or go to the movies. Day 7: Reward yourself! One week without smoking! Day 8: Make a dental appointment to get your teeth cleaned. Day 9: Decide how you will turn down a cigarette before it is offered to you. Day 10: Review your reasons for quitting. Day 11: Distract yourself. Stay active to keep your mind off smoking and to relieve tension. Take a walk, exercise, read a book, do a crossword puzzle, or try a new hobby. Day 12: Exercise. Get off the bus before your stop or use stairs instead of escalators. Day 13: Call on friends for support and encouragement. Day 14: Reward yourself! Two weeks without smoking! Day 15: Practice deep breathing exercises. Day 16: Bet a friend that you can stay a nonsmoker. Day 17: Ask to sit in nonsmoking sections of restaurants. Day 18: Hang up "No Smoking" signs. Day 19: Think of yourself as a nonsmoker. Day 20: Each morning, tell yourself you will not smoke. Day 21: Reward yourself! Three weeks without smoking! Day 22: Think of  smoking in negative ways. Remember how it  stains your teeth, gives you bad breath, and leaves you short of breath. Day 23: Eat a nutritious breakfast. Day 24:Do not relive your days as a smoker. Day 25: Hold a pencil in your hand when talking on the telephone. Day 26: Tell all your friends you do not smoke. Day 27: Think about how much better food tastes. Day 28: Remember, one cigarette is one too many. Day 29: Take up a hobby that will keep your hands busy. Day 30: Congratulations! One month without smoking! Give yourself a big reward. Your caregiver can direct you to community resources or hospitals for support, which may include:  Group support.  Education.  Hypnosis.  Subliminal therapy. Document Released: 10/25/2003 Document Revised: 04/20/2011 Document Reviewed: 11/12/2008 [facility] Patient Information 2014 [place], [place].

## 2012-08-22 NOTE — Assessment & Plan Note (Signed)
  Assessment: Progress toward smoking cessation:  smoking the same amount Barriers to progress toward smoking cessation:  lack of motivation to quit Comments: spent a long time discussing cessation and quitting tips. She is not very motivated to quit but encouraged her to think about it and do so given severity of breathing symptoms and chronic cough.    Plan: Instruction/counseling given:  I counseled patient on the dangers of tobacco use, advised patient to stop smoking, and reviewed strategies to maximize success. Educational resources provided:    Self management tools provided:    Medications to assist with smoking cessation:  None Patient agreed to the following self-care plans for smoking cessation: call [facility] (1-800-QUIT-NOW)  Other plans: referred to [facility] again for free assistance since she says she cannot afford NRTs

## 2012-08-22 NOTE — Assessment & Plan Note (Signed)
BP Readings from Last 3 Encounters:  08/22/12 116/69  08/18/12 125/65  08/03/12 102/64   Lab Results  Component Value Date   NA 142 08/03/2012   K 3.5 08/03/2012   CREATININE 0.92 08/03/2012   Assessment: Blood pressure control: controlled Progress toward BP goal:  at goal Comments: notes occasional SBP <100 at home at times but resolves  Plan: Medications:  continue current medications lasix 80mg  bid Educational resources provided:   Self management tools provided:   Other plans: asked to check bp often and bring log, monitor for hypotension

## 2012-08-23 ENCOUNTER — Telehealth: Payer: Self-pay | Admitting: Internal Medicine

## 2012-08-23 LAB — BASIC METABOLIC PANEL
BUN: 22 mg/dL (ref 6–23)
Calcium: 9.4 mg/dL (ref 8.4–10.5)
Potassium: 3.4 mEq/L — ABNORMAL LOW (ref 3.5–5.3)
Sodium: 141 mEq/L (ref 135–145)

## 2012-08-23 NOTE — Telephone Encounter (Signed)
I called Kelsey Garcia this morning to review her lab results.  Her K was a little low, 3.4 while she has been taking 80mg  lasix BID.  She informed me that she takes of potassium twice daily.  I recommended for her to take tonight and then resume her regular potassium regimen starting tomorrow.  She also asked if we could bring down her lasix dose now that she is back to her dry weight and she is distressed with frequent urination especially at night.  Therefore, we will keep lasix 40mg  in AM and decrease evening dose to 20mg  and hopefully that will give her more relief while continuing to diurese her.  She will be checking her weight daily like she always does and will let us know if her weight starts going back up or if her breathing gets worse or swelling.    She agrees with this plan and thanked me for the call and change in her lasix.  We will continue to follow Kelsey Garcia in [facility] closely.

## 2012-09-08 ENCOUNTER — Other Ambulatory Visit: Payer: Self-pay | Admitting: *Deleted

## 2012-09-08 ENCOUNTER — Other Ambulatory Visit: Payer: Self-pay | Admitting: Internal Medicine

## 2012-09-08 DIAGNOSIS — J449 Chronic obstructive pulmonary disease, unspecified: Secondary | ICD-10-CM

## 2012-09-08 DIAGNOSIS — J309 Allergic rhinitis, unspecified: Secondary | ICD-10-CM

## 2012-09-08 MED ORDER — TIOTROPIUM BROMIDE MONOHYDRATE 18 MCG IN CAPS: 18.0000 ug | ORAL_CAPSULE | Freq: Every day | RESPIRATORY_TRACT | Status: DC

## 2012-09-08 MED ORDER — MONTELUKAST SODIUM 10 MG PO TABS: 10.0000 mg | ORAL_TABLET | Freq: Every day | ORAL | Status: DC

## 2012-09-20 ENCOUNTER — Ambulatory Visit (INDEPENDENT_AMBULATORY_CARE_PROVIDER_SITE_OTHER): Payer: Medicare Other | Admitting: Internal Medicine

## 2012-09-20 ENCOUNTER — Encounter: Payer: Self-pay | Admitting: Internal Medicine

## 2012-09-20 VITALS — BP 121/63 | HR 75 | Temp 98.0°F | Resp 24 | Ht 64.0 in | Wt 219.2 lb

## 2012-09-20 DIAGNOSIS — R0609 Other forms of dyspnea: Secondary | ICD-10-CM

## 2012-09-20 DIAGNOSIS — J449 Chronic obstructive pulmonary disease, unspecified: Secondary | ICD-10-CM

## 2012-09-20 DIAGNOSIS — F17200 Nicotine dependence, unspecified, uncomplicated: Secondary | ICD-10-CM

## 2012-09-20 DIAGNOSIS — Z1211 Encounter for screening for malignant neoplasm of colon: Secondary | ICD-10-CM

## 2012-09-20 DIAGNOSIS — I10 Essential (primary) hypertension: Secondary | ICD-10-CM

## 2012-09-20 DIAGNOSIS — R0989 Other specified symptoms and signs involving the circulatory and respiratory systems: Secondary | ICD-10-CM

## 2012-09-20 DIAGNOSIS — R0600 Dyspnea, unspecified: Secondary | ICD-10-CM

## 2012-09-20 DIAGNOSIS — R609 Edema, unspecified: Secondary | ICD-10-CM

## 2012-09-20 LAB — BASIC METABOLIC PANEL
CO2: 31 mEq/L (ref 19–32)
Glucose, Bld: 91 mg/dL (ref 70–99)
Potassium: 3.5 mEq/L (ref 3.5–5.3)
Sodium: 140 mEq/L (ref 135–145)

## 2012-09-20 MED ORDER — LOSARTAN POTASSIUM 25 MG PO TABS: 12.5000 mg | ORAL_TABLET | Freq: Every day | ORAL | Status: DC

## 2012-09-20 MED ORDER — LISINOPRIL 2.5 MG PO TABS: 2.5000 mg | ORAL_TABLET | Freq: Every day | ORAL | Status: DC

## 2012-09-20 NOTE — Patient Instructions (Addendum)
General Instructions:  Please take lasix 80mg  three times a day for the next 3 days and weigh yourself daily and return to clinic on friday  Please return to clinic for follow up to recheck your weight  You have been referred to cardiology today as well  Please stop smoking.    We have started a low dose bp medication to help with your heart as well called losartan  Treatment Goals:  Goals (1 Years of Data) as of 09/20/12   None      Progress Toward Treatment Goals:  Treatment Goal 09/20/2012  Blood pressure at goal  Stop smoking smoking the same amount    Self Care Goals & Plans:  Self Care Goal 09/20/2012  Manage my medications take my medicines as prescribed; refill my medications on time  Monitor my health keep track of my blood pressure  Eat healthy foods eat foods that are low in salt; eat baked foods instead of fried foods  Be physically active find an activity I enjoy  Stop smoking set a quit date and stop smoking; call [facility] (1-800-QUIT-NOW)   Care Management & Community Referrals:  Referral 08/03/2012  Referrals made for care management support none needed  Referrals made to community resources -

## 2012-09-20 NOTE — Assessment & Plan Note (Signed)
Continues to smoke but has quit date in mind.
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
## 2012-09-20 NOTE — Assessment & Plan Note (Addendum)
Up 3 pounds from baseline ~216lbs.  Non-pitting lower extremity edema, faint bibasilar crackles. Continues to smoke.  ?diastolic dysfunction of chf or cor pulmonale in setting of severe COPD.   -cardiology referral -increased lasix to 80mg  tid x3 days, return to clinic on Friday for re-evaluation -bmet today

## 2012-09-20 NOTE — Assessment & Plan Note (Signed)
Refuses again for colonoscopy and says she doesn't want to to FOBT cards either as she has hemorrhoids and they will likely give a false positive.    i stressed the importance of screening colonoscopy again today, but she wishes to defer at this time.

## 2012-09-20 NOTE — Assessment & Plan Note (Signed)
  Assessment: Progress toward smoking cessation:  smoking the same amount Barriers to progress toward smoking cessation:  lack of motivation to quit Comments: claims to have set quit date in her mind but does not wish to share the date.   Plan: Instruction/counseling given:  I counseled patient on the dangers of tobacco use, advised patient to stop smoking, and reviewed strategies to maximize success. Educational resources provided:  other (see comments) (tips and tricks for when want to smoke) Self management tools provided:  smoking cessation calendar Medications to assist with smoking cessation:  None Patient agreed to the following self-care plans for smoking cessation: set a quit date and stop smoking;call [facility] (1-800-QUIT-NOW)

## 2012-09-20 NOTE — Assessment & Plan Note (Signed)
BP Readings from Last 3 Encounters:  09/20/12 121/63  08/22/12 116/69  08/18/12 125/65   Lab Results  Component Value Date   NA 141 08/22/2012   K 3.4* 08/22/2012   CREATININE 1.04 08/22/2012    Assessment: Blood pressure control: controlled Progress toward BP goal:  at goal  Plan: Medications:  continue current medications lasix, diltiazem 420mg  qd, and will start low dose losartan 12.5mg .  She claims she cannot tolerate ACEi due to cough Educational resources provided: brochure Self management tools provided: instructions for home blood pressure monitoring

## 2012-09-23 ENCOUNTER — Ambulatory Visit (INDEPENDENT_AMBULATORY_CARE_PROVIDER_SITE_OTHER): Payer: Medicare Other | Admitting: Internal Medicine

## 2012-09-23 ENCOUNTER — Encounter: Payer: Self-pay | Admitting: Internal Medicine

## 2012-09-23 VITALS — BP 98/58 | HR 75 | Temp 97.1°F | Ht 64.0 in | Wt 217.2 lb

## 2012-09-23 DIAGNOSIS — B370 Candidal stomatitis: Secondary | ICD-10-CM

## 2012-09-23 DIAGNOSIS — R609 Edema, unspecified: Secondary | ICD-10-CM

## 2012-09-23 DIAGNOSIS — I10 Essential (primary) hypertension: Secondary | ICD-10-CM

## 2012-09-23 MED ORDER — NYSTATIN 100000 UNIT/ML MT SUSP: 500000.0000 [IU] | Freq: Four times a day (QID) | OROMUCOSAL | Status: DC

## 2012-09-23 NOTE — Patient Instructions (Addendum)
General Instructions: -Stop taking Losartan (Cozzaar). -Use Nystatin swish and swallow for your dry mouth. You may use a mouth wash such as ACT in the future. Continue rinsing/celaning your mouth after using Symbicort.  -Start your regular Lasix dose tomorrow.  -Follow up with [place] next week.  -It was great meeting you!    Treatment Goals:  Goals (1 Years of Data) as of 09/23/12         09/20/12     Lifestyle    . Quit smoking / using tobacco  No      Progress Toward Treatment Goals:  Treatment Goal 09/23/2012  Blood pressure at goal  Stop smoking smoking less    Self Care Goals & Plans:  Self Care Goal 09/23/2012  Manage my medications take my medicines as prescribed; bring my medications to every visit  Monitor my health -  Eat healthy foods eat more vegetables  Be physically active -  Stop smoking set a quit date and stop smoking       Care Management & Community Referrals:  Referral 09/23/2012  Referrals made for care management support none needed  Referrals made to community resources -

## 2012-09-23 NOTE — Progress Notes (Signed)
  Subjective:    Patient ID: Kelsey Garcia, female    DOB: 1945/11/29, 67 y.o.   MRN: [id number]  HPI Kelsey Garcia is a 67 year old woman with PMH of COPD with ongoing tobacco use, HTN, morbid obesity, and chronic bilateral lower extremity edema who presents for follow visit for increased LE edema. Her Lasix dose was increased to 80mg  TID for 3 days (from 80mg  BID) during her last visit on 8/12 due to mild volume overload with weight gain of 3 lbs from her baseline. She was also started on Losartan 12.5mg  during that visit.  Her weight during this visit has decreased by 2 lbs, she reports increased diuresis. Her LE edema has decreased significantly per her report. Her SOB is at baseline. Her fluid intake is generous, she complains of dry mouth that triggers her to constantly take sips of water. She has dizziness upon standing for the past three days but no falls.  She continues to cut back on cigarette smoking.      Review of Systems  Constitutional: Negative for fever, chills, diaphoresis, activity change, appetite change, fatigue and unexpected weight change.  HENT:       Dry mouth  Respiratory: Positive for shortness of breath. Negative for cough and wheezing.   Cardiovascular: Positive for leg swelling. Negative for chest pain and palpitations.  Gastrointestinal: Negative for abdominal pain and abdominal distention.  Genitourinary: Positive for frequency. Negative for dysuria.  Neurological: Positive for dizziness and light-headedness. Negative for weakness.  Psychiatric/Behavioral: Negative for behavioral problems and agitation.       Objective:   Physical Exam  Nursing note and vitals reviewed. Constitutional: She is oriented to person, place, and time. She appears well-developed and well-nourished. No distress.  Obese  HENT:  White plaque covering tongue that is easily scraped off. No tongue edema or erythema.  Edentulous, wearing dentures  Eyes: Conjunctivae are normal. No scleral  icterus.  Cardiovascular: Normal rate and regular rhythm.   Pulmonary/Chest: Effort normal. No respiratory distress. She has no wheezes. She has rales.  Bibasilar lung crackles   Abdominal: Soft. She exhibits no distension.  Musculoskeletal: She exhibits edema. She exhibits no tenderness.  Bilateral pretibial pitting edema   Neurological: She is alert and oriented to person, place, and time.  Skin: Skin is warm and dry. No rash noted. She is not diaphoretic. No erythema. No pallor.  Psychiatric: She has a normal mood and affect. Her behavior is normal.          Assessment & Plan:

## 2012-09-23 NOTE — Progress Notes (Signed)
Case discussed with Dr. Qureshi at the time of the visit.  We reviewed the resident's history and exam and pertinent patient test results.  I agree with the assessment, diagnosis, and plan of care documented in the resident's note. 

## 2012-09-24 DIAGNOSIS — B370 Candidal stomatitis: Secondary | ICD-10-CM | POA: Insufficient documentation

## 2012-09-24 NOTE — Assessment & Plan Note (Signed)
Edema is almost completely resolved. Lasix back to 80mg  BID. Pt advised to limit fluid intake as tolerated.

## 2012-09-24 NOTE — Assessment & Plan Note (Signed)
Pt has had dry mouth which triggers her to drink large amounts of water everyday.  Nystatin 500,000 units 4 times daily swish and swallow. Pt advised to soak/clean dentures with Nystatin.  Pt advised to rinse mouth after using Symbicort to prevent recurrence of oral thrush.

## 2012-09-24 NOTE — Assessment & Plan Note (Signed)
BP Readings from Last 3 Encounters:  09/23/12 98/58  09/20/12 121/63  08/22/12 116/69    Lab Results  Component Value Date   NA 140 09/20/2012   K 3.5 09/20/2012   CREATININE 1.06 09/20/2012    Assessment: Blood pressure control: controlled Progress toward BP goal:  at goal (low) Comments: Pt on Diltiazem 420mg  daily, Lasix 80mg  TID, Losartan 12.5mg  daily  Plan: Medications:  Discontinued Losartan, Lasix changed to 80mg  BID starting 8/16. Continue Diltiazem 420mg  daily Educational resources provided: brochure Self management tools provided:   Other plans: Follow up next week.

## 2012-09-26 NOTE — Progress Notes (Signed)
Case discussed with Dr. Kennerly soon after the resident saw the patient.  We reviewed the resident's history and exam and pertinent patient test results.  I agree with the assessment, diagnosis, and plan of care documented in the resident's note. 

## 2012-09-29 ENCOUNTER — Ambulatory Visit (INDEPENDENT_AMBULATORY_CARE_PROVIDER_SITE_OTHER): Payer: Medicare Other | Admitting: Internal Medicine

## 2012-09-29 ENCOUNTER — Encounter: Payer: Self-pay | Admitting: Internal Medicine

## 2012-09-29 VITALS — BP 111/71 | HR 86 | Temp 97.5°F | Wt 217.7 lb

## 2012-09-29 DIAGNOSIS — Z780 Asymptomatic menopausal state: Secondary | ICD-10-CM

## 2012-09-29 DIAGNOSIS — I10 Essential (primary) hypertension: Secondary | ICD-10-CM

## 2012-09-29 DIAGNOSIS — R238 Other skin changes: Secondary | ICD-10-CM

## 2012-09-29 DIAGNOSIS — R609 Edema, unspecified: Secondary | ICD-10-CM

## 2012-09-29 DIAGNOSIS — Z0000 Encounter for general adult medical examination without abnormal findings: Secondary | ICD-10-CM

## 2012-09-29 DIAGNOSIS — E785 Hyperlipidemia, unspecified: Secondary | ICD-10-CM

## 2012-09-29 DIAGNOSIS — J449 Chronic obstructive pulmonary disease, unspecified: Secondary | ICD-10-CM

## 2012-09-29 DIAGNOSIS — B370 Candidal stomatitis: Secondary | ICD-10-CM

## 2012-09-29 DIAGNOSIS — K649 Unspecified hemorrhoids: Secondary | ICD-10-CM

## 2012-09-29 LAB — PROTIME-INR: Prothrombin Time: 12.9 seconds (ref 11.6–15.2)

## 2012-09-29 LAB — CBC
HCT: 40.3 % (ref 36.0–46.0)
Hemoglobin: 13.8 g/dL (ref 12.0–15.0)
MCV: 95.5 fL (ref 78.0–100.0)
RDW: 13.6 % (ref 11.5–15.5)
WBC: 10.7 10*3/uL — ABNORMAL HIGH (ref 4.0–10.5)

## 2012-09-29 LAB — LIPID PANEL
HDL: 43 mg/dL (ref >39)
LDL Cholesterol: 117 mg/dL — ABNORMAL HIGH (ref 0–99)
Total CHOL/HDL Ratio: 4.2 Ratio
VLDL: 21 mg/dL (ref 0–40)

## 2012-09-29 NOTE — Patient Instructions (Addendum)
-  Your blood pressure is much better today.  -Continue taking Lasix 80mg  twice per day.  -We will call you with the appointment for the DEXA scan (for your bones) and the Pulmonary Function Test.  -Follow up with Dr. Virgina Organ in 2 months.

## 2012-10-01 DIAGNOSIS — R238 Other skin changes: Secondary | ICD-10-CM | POA: Insufficient documentation

## 2012-10-01 NOTE — Assessment & Plan Note (Addendum)
BP Readings from Last 3 Encounters:  09/29/12 111/71  09/23/12 98/58  09/20/12 121/63    Lab Results  Component Value Date   NA 140 09/20/2012   K 3.5 09/20/2012   CREATININE 1.06 09/20/2012    Assessment: Blood pressure control:  Controlled Progress toward BP goal:   at goal Comments: Continue Diltiazem 420mg  daily, Lasix 80 mg BID.   Plan: Medications:  continue current medications Educational resources provided:   Self management tools provided:   Other plans: Continue monitoring. Would not start ARB due to low BP with this medication.

## 2012-10-01 NOTE — Assessment & Plan Note (Signed)
Ordered DEXA scan

## 2012-10-01 NOTE — Assessment & Plan Note (Signed)
Stable. Ordered PFTs.

## 2012-10-01 NOTE — Assessment & Plan Note (Addendum)
Checked lipid profile during this visit with LDL of 117 with low/average risk for CAD. Will not start statin therapy at this time.

## 2012-10-01 NOTE — Progress Notes (Signed)
  Subjective:    Patient ID: Kelsey Garcia, female    DOB: 05-Aug-1945, 67 y.o.   MRN: [id number]  HPI Kelsey Garcia is a 67 year old woman with PMH of HTN, COPD with chronic respiratory failure on home O2, and HTN who presents for follow up visit for her lower extremity edema and HTN. She states that weighs herself daily and her weight is not stable with the bilateral lower extremity edema almost completely gone. She has reduced her intake of fluids from 64 oz to 48 oz per day.   Her blood pressure at home has been stable with SBP above 100 since discontinuation of Cozaar.   She used Nystatin swish and swallow and noticed improvement of her dry mouth.  Review of Systems  Constitutional: Negative for fever, chills, diaphoresis and appetite change.  HENT: Negative for nosebleeds.        Dry mouth  Respiratory: Positive for shortness of breath. Negative for cough and wheezing.   Cardiovascular: Positive for leg swelling. Negative for chest pain and palpitations.  Gastrointestinal: Negative for abdominal pain.  Genitourinary: Negative for dysuria.  Musculoskeletal: Positive for back pain.  Skin: Negative for color change, pallor, rash and wound.  Neurological: Negative for dizziness, light-headedness and headaches.  Hematological: Bruises/bleeds easily.  Psychiatric/Behavioral: Negative for behavioral problems and agitation.       Objective:   Physical Exam  Nursing note and vitals reviewed. Constitutional: She is oriented to person, place, and time. She appears well-developed. No distress.  Obese  HENT:  Mouth/Throat: Oropharynx is clear and moist. No oropharyngeal exudate.  Tongue pink, moist, with no white plaque  Eyes: Conjunctivae are normal. No scleral icterus.  Cardiovascular: Normal rate.   Pulmonary/Chest: Effort normal. No respiratory distress. She has no wheezes. She has rales.  Bibasilar lung crackles  Abdominal: Soft. She exhibits no distension.  Musculoskeletal: She exhibits  edema. She exhibits no tenderness.  Pretibial pitting edema bilaterally, which is improved since her last visit  Neurological: She is alert and oriented to person, place, and time.  Skin: Skin is warm and dry. No rash noted. She is not diaphoretic. No erythema. No pallor.  Scattered small bruises in her forearms bilaterally   Psychiatric: She has a normal mood and affect. Her behavior is normal.          Assessment & Plan:

## 2012-10-01 NOTE — Assessment & Plan Note (Signed)
Much improved with decreased fluid intake from 64oz to 48 oz and Lasix 80mg  TID for 3 days.  Pt to continue Lasix 80mg  BID with fluid restriction.

## 2012-10-01 NOTE — Assessment & Plan Note (Signed)
She denies melena, hematochezia, nosebleeds. She is on ASA tx.  Ordered CBC and PT/INR. Normal level for platelets and nl INR.

## 2012-10-01 NOTE — Assessment & Plan Note (Addendum)
Resolved with Nystatin swich and swallow.  Pt rinses mouth after Symbicort use to prevent oral thrush.  Would consider adding a spacer for her Symbicort inhaler to reduce risk of oral thrush.

## 2012-10-02 NOTE — Progress Notes (Signed)
Case discussed with Dr. Kennerly immediately after the visit. We reviewed the resident's history and exam and pertinent patient test results. I agree with the assessment, diagnosis and plan of care documented in the resident's note. 

## 2012-10-04 NOTE — Addendum Note (Signed)
Addended by: Bufford Spikes on: 10/04/2012 04:33 PM   Modules accepted: Orders

## 2012-10-06 ENCOUNTER — Other Ambulatory Visit: Payer: Self-pay | Admitting: *Deleted

## 2012-10-06 DIAGNOSIS — J449 Chronic obstructive pulmonary disease, unspecified: Secondary | ICD-10-CM

## 2012-10-06 DIAGNOSIS — R05 Cough: Secondary | ICD-10-CM

## 2012-10-06 MED ORDER — HYDROCODONE-HOMATROPINE 5-1.5 MG/5ML PO SYRP: 5.0000 mL | ORAL_SOLUTION | Freq: Three times a day (TID) | ORAL | Status: DC | PRN

## 2012-10-06 NOTE — Telephone Encounter (Signed)
Called to pharm 

## 2012-10-06 NOTE — Telephone Encounter (Signed)
Please call in hycodan

## 2012-10-12 ENCOUNTER — Ambulatory Visit (HOSPITAL_COMMUNITY): Payer: Medicare Other

## 2012-10-12 ENCOUNTER — Encounter: Payer: Self-pay | Admitting: Internal Medicine

## 2012-10-13 ENCOUNTER — Ambulatory Visit (HOSPITAL_COMMUNITY)
Admission: RE | Admit: 2012-10-13 | Discharge: 2012-10-13 | Disposition: A | Discharge status: Home / Self Care | Payer: Medicare Other | Source: Ambulatory Visit | Attending: Internal Medicine | Admitting: Internal Medicine

## 2012-10-13 DIAGNOSIS — J4489 Other specified chronic obstructive pulmonary disease: Secondary | ICD-10-CM | POA: Insufficient documentation

## 2012-10-13 DIAGNOSIS — J449 Chronic obstructive pulmonary disease, unspecified: Secondary | ICD-10-CM | POA: Insufficient documentation

## 2012-10-13 MED ORDER — ALBUTEROL SULFATE (5 MG/ML) 0.5% IN NEBU
2.5000 mg | INHALATION_SOLUTION | Freq: Once | RESPIRATORY_TRACT | Status: AC
  Administered 2012-10-13: 2.5 mg via RESPIRATORY_TRACT

## 2012-10-17 ENCOUNTER — Ambulatory Visit (HOSPITAL_COMMUNITY)
Admission: RE | Admit: 2012-10-17 | Discharge: 2012-10-17 | Disposition: A | Discharge status: Home / Self Care | Payer: Medicare Other | Source: Ambulatory Visit | Attending: Internal Medicine | Admitting: Internal Medicine

## 2012-10-17 DIAGNOSIS — Z780 Asymptomatic menopausal state: Secondary | ICD-10-CM | POA: Insufficient documentation

## 2012-10-17 DIAGNOSIS — Z13820 Encounter for screening for osteoporosis: Secondary | ICD-10-CM | POA: Insufficient documentation

## 2012-11-04 ENCOUNTER — Encounter: Payer: Self-pay | Admitting: *Deleted

## 2012-11-08 ENCOUNTER — Encounter: Payer: Medicare Other | Admitting: Cardiovascular Disease

## 2012-11-16 ENCOUNTER — Other Ambulatory Visit: Payer: Self-pay | Admitting: *Deleted

## 2012-11-16 ENCOUNTER — Encounter: Payer: Medicare Other | Admitting: Physician Assistant

## 2012-11-16 DIAGNOSIS — M1990 Unspecified osteoarthritis, unspecified site: Secondary | ICD-10-CM

## 2012-11-16 DIAGNOSIS — R05 Cough: Secondary | ICD-10-CM

## 2012-11-16 DIAGNOSIS — M169 Osteoarthritis of hip, unspecified: Secondary | ICD-10-CM

## 2012-11-16 DIAGNOSIS — J449 Chronic obstructive pulmonary disease, unspecified: Secondary | ICD-10-CM

## 2012-11-16 NOTE — Telephone Encounter (Signed)
FYI - per [facility] database, pt got hydrocodone cough med on 11/07/12 for and got hydrocodone pills on 10/19/2012 for #120. Per most recent message, pt states has has been out for 48 hours. But according to refills, she should still have RF.  I have raised the concern before, but wasn't about to quickly find that it has been addressed (did quick review only), that she doesn't need both an opioid cough med and pill. Along with all of her other sedating meds, it is just too risky. And cough meds should not be used chronically.

## 2012-11-16 NOTE — Telephone Encounter (Signed)
Pt was at front desk and was informed of dr butcher's decision not to approve script for hydrocodone since 1) no 48 notice 2) dr Virgina Organ needs to review the 2 forms of hydrocodone being given and requested by pt, pt states she called yesterday and spoke to debbie ( debbie was off all of yesterday) . She is very angry and uses very foul language at the end of conversation she stated'  tell dr Midwife she can kiss my mother F_ _ _ _ _ ass., stated this twice. She was told she would be notified when scripts are ready.

## 2012-11-17 ENCOUNTER — Other Ambulatory Visit: Payer: Self-pay | Admitting: *Deleted

## 2012-11-17 DIAGNOSIS — M1990 Unspecified osteoarthritis, unspecified site: Secondary | ICD-10-CM

## 2012-11-17 DIAGNOSIS — M169 Osteoarthritis of hip, unspecified: Secondary | ICD-10-CM

## 2012-11-17 MED ORDER — HYDROCODONE-ACETAMINOPHEN 7.5-325 MG PO TABS: 1.0000 | ORAL_TABLET | Freq: Four times a day (QID) | ORAL | Status: DC | PRN

## 2012-11-17 NOTE — Telephone Encounter (Signed)
Refill ready and pt informed 

## 2012-11-17 NOTE — Telephone Encounter (Signed)
Last filled 9/10. Pharmacy needs hard copy for this Rx.

## 2012-11-17 NOTE — Telephone Encounter (Signed)
Rx ready, pt informed 

## 2012-11-17 NOTE — Telephone Encounter (Signed)
Pt informed Rx is ready 

## 2012-11-28 ENCOUNTER — Ambulatory Visit (INDEPENDENT_AMBULATORY_CARE_PROVIDER_SITE_OTHER): Payer: Medicare Other | Admitting: Physician Assistant

## 2012-11-28 ENCOUNTER — Encounter: Payer: Self-pay | Admitting: Physician Assistant

## 2012-11-28 VITALS — BP 128/70 | HR 83 | Ht 63.0 in | Wt 208.0 lb

## 2012-11-28 DIAGNOSIS — I4891 Unspecified atrial fibrillation: Secondary | ICD-10-CM

## 2012-11-28 DIAGNOSIS — I10 Essential (primary) hypertension: Secondary | ICD-10-CM

## 2012-11-28 DIAGNOSIS — F17200 Nicotine dependence, unspecified, uncomplicated: Secondary | ICD-10-CM

## 2012-11-28 DIAGNOSIS — J449 Chronic obstructive pulmonary disease, unspecified: Secondary | ICD-10-CM

## 2012-11-28 DIAGNOSIS — F329 Major depressive disorder, single episode, unspecified: Secondary | ICD-10-CM

## 2012-11-28 DIAGNOSIS — Z720 Tobacco use: Secondary | ICD-10-CM

## 2012-11-28 DIAGNOSIS — I5032 Chronic diastolic (congestive) heart failure: Secondary | ICD-10-CM

## 2012-11-28 NOTE — Patient Instructions (Addendum)
The new blood thinners that would take the place of coumadin to prevent a stroke are: Pradaxa Xarelto Eliquis If your insurance covers one of these, we can start you on it. Otherwise, you should be on coumadin to prevent a stroke. Have your PCP draw a BMET tomorrow at your appointment. Schedule follow up with Dr. [name] in 3 months  Per your request due to you leaving before your after visit summary to mail it to you. Office have mailed it to you.

## 2012-11-28 NOTE — Progress Notes (Signed)
[address], [address] [place], [place]  [address] Phone: [phone] Fax:  [phone]  Date:  11/28/2012   ID:  Kelsey Garcia, DOB 1945/05/29, MRN [id number]  PCP:  Darden Palmer, MD  Cardiologist:  Dr. Charlton Haws     History of Present Illness: Kelsey Garcia is a 67 y.o. female who returns for follow up on CHF,  She is prior patient of Dr. Deborah Chalk and was seen by Dr. [name] initially during a hospitalization in 09/2010 for AFib.  She has a hx of permanent AFib, s/p 2 failed DCCVs, diastolic CHF, COPD, HTN, HL, tobacco abuse, OCD.  Echo in 2010 with normal LVF.  Dr. [name] recommended anticoagulation in 2012 but patient declined (CHADS2-VASc=4).  Last echo (07/2012):  Normal LV wall thickness and motion, EF 60-65%, mild RAE.  She was recently seen in follow up with her PCP and noted to have worsening LE edema.  Lasix was increased with improvement according to the notes in her chart.    Patient notes her LE edema is improved since taking Lasix 80 bid.  She has been trying to lose weight over the last 1 year (lost 30lbs).  Also exercising at home.  Denies exertional CP.  Notes DOE that is chronic.  Seems to be worse.  She is NYHA Class IIb.  No orthopnea or PND.  No syncope.    Labs (6/14):  BNP 1190, TSH 1.098 Labs (8/14):  K 3.5, Cr 1.06, HDL 43, LDL 117,  Hgb 13.8  Wt Readings from Last 3 Encounters:  11/28/12 208 lb (94.348 kg)  09/29/12 217 lb 11.2 oz (98.748 kg)  09/23/12 217 lb 3.2 oz (98.521 kg)     Past Medical History  Diagnosis Date  . Atrial fibrillation   . Hypertension   . Insomnia   . Nonischemic cardiomyopathy   . Hyperlipidemia   . Anxiety   . Cholelithiasis   . Insomnia   . Mitral regurgitation     noted 2010  . H/O epistaxis   . Rhinitis, allergic   . CHF (congestive heart failure)   . Pneumonia     "several times w/exacerbations of the COPD; nothing in the last year" (07/15/2012)  . COPD (chronic obstructive pulmonary disease)     as of 7/13 on  2-3L, pfts 10/2008 with mod obstruction  . Chronic bronchitis with COPD (chronic obstructive pulmonary disease)   . Shortness of breath     "all the time right now" (07/15/2012)  . GERD (gastroesophageal reflux disease)   . XLKGMWNU(272.5)     "weekly" (07/15/2012)  . Migraines     "weekly for awhile; cleared up as I got older" (07/15/2012)  . DJD (degenerative joint disease)   . Arthritis     "all over" (07/15/2012)  . OCD (obsessive compulsive disorder)   . OCD (obsessive compulsive disorder)   . Depression     h/o SI; "last time I was really serious about it was ~ 1997" (07/15/2012)    Current Outpatient Prescriptions  Medication Sig Dispense Refill  . albuterol (PROVENTIL HFA;VENTOLIN HFA) 108 (90 BASE) MCG/ACT inhaler Inhale 2 puffs into the lungs every 6 (six) hours as needed for wheezing.  8.5 g  6  . aspirin 325 MG tablet Take 650 mg by mouth daily.       . budesonide-formoterol (SYMBICORT) 160-4.5 MCG/ACT inhaler Inhale 2 puffs into the lungs 2 (two) times daily.  10.2 g  11  . cetirizine (ZYRTEC) 10 MG tablet  Take 10 mg by mouth 2 (two) times daily.      . diazepam (VALIUM) 2 MG tablet Take 1 tablet (2 mg total) by mouth every 12 (twelve) hours as needed for anxiety.  60 tablet  4  . diltiazem (TIAZAC) 420 MG 24 hr capsule Take 1 capsule (420 mg total) by mouth daily.  90 capsule  3  . FLUoxetine (PROZAC) 20 MG capsule Take 3 capsules (60 mg total) by mouth daily.  90 capsule  6  . furosemide (LASIX) 40 MG tablet Take 2 tablets (80 mg total) by mouth 2 (two) times daily.  120 tablet  11  . HYDROcodone-acetaminophen (NORCO) 7.5-325 MG per tablet Take 1 tablet by mouth every 6 (six) hours as needed for pain.  120 tablet  0  . HYDROcodone-homatropine (HYCODAN) 5-1.5 MG/5ML syrup Take 5 mLs by mouth every 8 (eight) hours as needed for cough. For cough.  480 mL  3  . montelukast (SINGULAIR) 10 MG tablet Take 1 tablet (10 mg total) by mouth at bedtime.  90 tablet  3  . nystatin (MYCOSTATIN)  100000 UNIT/ML suspension Take 5 mL (500,000 Units total) by mouth 4 (four) times daily.  60 mL  0  . pantoprazole (PROTONIX) 40 MG tablet Take 1 tablet (40 mg total) by mouth 2 (two) times daily.  60 tablet  11  . potassium chloride SA (K-DUR,KLOR-CON) 20 MEQ tablet Take 0.5 tablets (10 mEq total) by mouth 2 (two) times daily.  30 tablet  6  . tiotropium (SPIRIVA) 18 MCG inhalation capsule Place 1 capsule (18 mcg total) into inhaler and inhale daily.  90 capsule  3  . zolpidem (AMBIEN) 5 MG tablet Take 1 tablet (5 mg total) by mouth at bedtime as needed for sleep.  30 tablet  0   No current facility-administered medications for this visit.    Allergies:    Allergies  Allergen Reactions  . Ace Inhibitors Cough  . Diphenhydramine Hcl Other (See Comments)    "feels like my skin is crawling, and legs twitches"  . Erythromycin Diarrhea  . Nsaids Nausea And Vomiting  . Nyquil [Pseudoeph-Doxylamine-Dm-Apap] Itching  . Tramadol Hcl Other (See Comments)    stomach pain, hallucinations  . Pseudoephedrine Palpitations    Social History:  The patient  reports that she has been smoking Cigarettes.  She has a 17.5 pack-year smoking history. She has never used smokeless tobacco. She reports that she does not drink alcohol or use illicit drugs.   Family History:  The patient's family history includes Allergies in an other family member; Asthma in an other family member; Cancer in an other family member; Emphysema in an other family member.   ROS:  Please see the history of present illness.   She has a chronic cough.   All other systems reviewed and negative.   PHYSICAL EXAM: VS:  BP 128/70  Pulse 83  Ht 5\' 3"  (1.6 m)  Wt 208 lb (94.348 kg)  BMI 36.85 kg/m2  SpO2 94% Well nourished, well developed, in no acute distress HEENT: normal Neck: no JVD Cardiac:  normal S1, S2; RRR; no murmur Lungs:  clear to auscultation bilaterally, no wheezing, rhonchi or rales Abd: soft, nontender, no  hepatomegaly Ext: no edema Skin: warm and dry Neuro:  CNs 2-12 intact, no focal abnormalities noted  EKG:  Atrial fibrillation, HR 83     ASSESSMENT AND PLAN:  1. Chronic Diastolic CHF:  Volume appears stable.  She likely has an Radiographer, therapeutic  of R sided HF given her long hx of COPD.  I would suggest she continue her current dose of lasix.  If symptoms worsen in the future, consider Demadex.  Check a follow up BMET.  She can get drawn with PCP tomorrow. 2. Atrial Fibrillation:  Rate controlled.  CHADS2-VASc=4.  We had a long discussion regarding the need for anticoagulation for stroke prevention.  She does not want to take coumadin.  I have given her the names of the NOACs to see if her insurance will cover. 3. Hypertension:  Controlled. 4. COPD:  I have recommended she follow up with pulmonary. 5. Tobacco Abuse:  She is trying to quit. 6. Depression:  Of note, patient did mention to me at one point that she is comforted knowing that if her medical problems do not improve, she has her friend, "suicide to keep me company."  I asked her explicitly if she has a plan to commit suicide and she denies this.  She denies suicidal ideations.  She should follow up with her psychiatrist.  7. Disposition:  F/u with Dr. Charlton Haws in 3 mos.  Of note, patient was concerned about her ride leaving her and she left abruptly before getting her paperwork or follow up appointments.    Signed, Tereso Newcomer, PA-C  11/28/2012 2:15 PM

## 2012-11-29 ENCOUNTER — Encounter: Payer: Self-pay | Admitting: Internal Medicine

## 2012-11-29 ENCOUNTER — Ambulatory Visit (INDEPENDENT_AMBULATORY_CARE_PROVIDER_SITE_OTHER): Payer: Medicare Other | Admitting: Internal Medicine

## 2012-11-29 VITALS — BP 120/71 | HR 85 | Temp 98.2°F | Ht 64.0 in | Wt 211.3 lb

## 2012-11-29 DIAGNOSIS — I509 Heart failure, unspecified: Secondary | ICD-10-CM

## 2012-11-29 DIAGNOSIS — Z23 Encounter for immunization: Secondary | ICD-10-CM

## 2012-11-29 DIAGNOSIS — R05 Cough: Secondary | ICD-10-CM

## 2012-11-29 DIAGNOSIS — M169 Osteoarthritis of hip, unspecified: Secondary | ICD-10-CM

## 2012-11-29 DIAGNOSIS — J449 Chronic obstructive pulmonary disease, unspecified: Secondary | ICD-10-CM

## 2012-11-29 DIAGNOSIS — F17200 Nicotine dependence, unspecified, uncomplicated: Secondary | ICD-10-CM

## 2012-11-29 DIAGNOSIS — I10 Essential (primary) hypertension: Secondary | ICD-10-CM

## 2012-11-29 DIAGNOSIS — M1990 Unspecified osteoarthritis, unspecified site: Secondary | ICD-10-CM

## 2012-11-29 DIAGNOSIS — M810 Age-related osteoporosis without current pathological fracture: Secondary | ICD-10-CM | POA: Insufficient documentation

## 2012-11-29 MED ORDER — HYDROCODONE-ACETAMINOPHEN 7.5-325 MG PO TABS: 1.0000 | ORAL_TABLET | Freq: Four times a day (QID) | ORAL | Status: DC | PRN

## 2012-11-29 MED ORDER — ALENDRONATE SODIUM 70 MG PO TABS: 70.0000 mg | ORAL_TABLET | ORAL | Status: DC

## 2012-11-29 MED ORDER — GUAIFENESIN 100 MG/5ML PO SOLN: 5.0000 mL | ORAL | Status: DC | PRN

## 2012-11-29 NOTE — Assessment & Plan Note (Signed)
  Assessment: Progress toward smoking cessation:  smoking the same amount Barriers to progress toward smoking cessation:    Comments: no desire to quit at this time, still smoking ~1/2ppd, claims has set a quit date but will not tell anyone when Plan: Instruction/counseling given:  I counseled patient on the dangers of tobacco use, advised patient to stop smoking, and reviewed strategies to maximize success. Educational resources provided:    Self management tools provided:    Medications to assist with smoking cessation:  None Patient agreed to the following self-care plans for smoking cessation: set a quit date and stop smoking;call [facility] (1-800-QUIT-NOW)  Other plans: declined assistance efforts
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
## 2012-11-29 NOTE — Assessment & Plan Note (Signed)
BP Readings from Last 3 Encounters:  11/29/12 120/71  11/28/12 128/70  09/29/12 111/71   Lab Results  Component Value Date   NA 140 09/20/2012   K 3.5 09/20/2012   CREATININE 1.06 09/20/2012   Assessment: Blood pressure control: controlled Progress toward BP goal:  at goal  Plan: Medications:  continue current medications lasix 80mg  bid, diltiazem 420mg  qd Educational resources provided:   Self management tools provided: home blood pressure logbook

## 2012-11-29 NOTE — Assessment & Plan Note (Addendum)
NO MORE HYCODAN  Recommended robitussin that can be found OTC. She thinks the dextromethorphan may be irritating her because she claims regular cough syrup makes her though up so we recommended robitussin at this time.

## 2012-11-29 NOTE — Assessment & Plan Note (Signed)
Continues to smoke, claims has quit date set but will not reveal to anyone  PFTs done, recommend to call pulmonary rehab

## 2012-11-29 NOTE — Assessment & Plan Note (Addendum)
Per dexa 10/2012.  -will start alendronate q weekly.  Counseled on proper administration to try to take first thing in the morning and ?30 minutes before first food, beverage (except plain water), or other medication(s) of the day. Do not take with mineral water or with other beverages.  Counseled her to stay upright (not to lie down) for at least 30 minutes and until after first food of the day (to reduce esophageal irritation). She is able to voice understanding.  I also counseled her that if her pain got worse or she experiences GI upset or abdominal pain/dyspepsia, to discontinue tablets and let us know.  We also discussed calcium and vitamin d supplementation but she says she does not like taking those.

## 2012-11-29 NOTE — Patient Instructions (Signed)
Please adhere to the quit date you have set and let us know when the day comes  Please try robitussin cough syrup that has been prescribed  Please start taking alendronate once weekly for osteoporosis. Try to take first thing in the morning and ?30 minutes before first food, beverage (except plain water), or other medication(s) of the day. Do not take with mineral water or with other beverages.  Stay upright (not to lie down) for at least 30 minutes and until after first food of the day (to reduce esophageal irritation).  Alendronate tablets What is this medicine? ALENDRONATE (a LEN droe nate) slows calcium loss from bones. It helps to make normal healthy bone and to slow bone loss in people with Paget's disease and osteoporosis. It may be used in others at risk for bone loss. This medicine may be used for other purposes; ask your health care provider or pharmacist if you have questions. What should I tell my health care provider before I take this medicine? They need to know if you have any of these conditions: -dental disease -esophagus, stomach, or intestine problems, like acid reflux or GERD -kidney disease -low blood calcium -low vitamin D -problems sitting or standing 30 minutes -trouble swallowing -an unusual or allergic reaction to alendronate, other medicines, foods, dyes, or preservatives -pregnant or trying to get pregnant -breast-feeding How should I use this medicine? You must take this medicine exactly as directed or you will lower the amount of the medicine you absorb into your body or you may cause yourself harm. Take this medicine by mouth first thing in the morning, after you are up for the day. Do not eat or drink anything before you take your medicine. Swallow the tablet with a full glass (6 to 8 fluid ounces) of plain water. Do not take this medicine with any other drink. Do not chew or crush the tablet. After taking this medicine, do not eat breakfast, drink, or take any  medicines or vitamins for at least 30 minutes. Sit or stand up for at least 30 minutes after you take this medicine; do not lie down. Do not take your medicine more often than directed. Talk to your pediatrician regarding the use of this medicine in children. Special care may be needed. Overdosage: If you think you have taken too much of this medicine contact a poison control center or emergency room at once. NOTE: This medicine is only for you. Do not share this medicine with others. What if I miss a dose? If you miss a dose, do not take it later in the day. Continue your normal schedule starting the next morning. Do not take double or extra doses. What may interact with this medicine? -aluminum hydroxide -antacids -aspirin -calcium supplements -drugs for inflammation like ibuprofen, naproxen, and others -iron supplements -magnesium supplements -vitamins with minerals This list may not describe all possible interactions. Give your health care provider a list of all the medicines, herbs, non-prescription drugs, or dietary supplements you use. Also tell them if you smoke, drink alcohol, or use illegal drugs. Some items may interact with your medicine. What should I watch for while using this medicine? Visit your doctor or health care professional for regular checks ups. It may be some time before you see benefit from this medicine. Do not stop taking your medicine except on your doctor's advice. Your doctor or health care professional may order blood tests and other tests to see how you are doing. You should make sure you  get enough calcium and vitamin D while you are taking this medicine, unless your doctor tells you not to. Discuss the foods you eat and the vitamins you take with your health care professional. Some people who take this medicine have severe bone, joint, and/or muscle pain. This medicine may also increase your risk for a broken thigh bone. Tell your doctor right away if you have  pain in your upper leg or groin. Tell your doctor if you have any pain that does not go away or that gets worse. This medicine can make you more sensitive to the sun. If you get a rash while taking this medicine, sunlight may cause the rash to get worse. Keep out of the sun. If you cannot avoid being in the sun, wear protective clothing and use sunscreen. Do not use sun lamps or tanning beds/booths. What side effects may I notice from receiving this medicine? Side effects that you should report to your doctor or health care professional as soon as possible: -allergic reactions like skin rash, itching or hives, swelling of the face, lips, or tongue -black or tarry stools -bone, muscle or joint pain -changes in vision -chest pain -heartburn or stomach pain -jaw pain, especially after dental work -pain or trouble when swallowing -redness, blistering, peeling or loosening of the skin, including inside the mouth Side effects that usually do not require medical attention (report to your doctor or health care professional if they continue or are bothersome): -changes in taste -diarrhea or constipation -eye pain or itching -headache -nausea or vomiting -stomach gas or fullness This list may not describe all possible side effects. Call your doctor for medical advice about side effects. You may report side effects to FDA at 1-800-FDA-1088. Where should I keep my medicine? Keep out of the reach of children. Store at room temperature of 15 and 30 degrees C (59 and 86 degrees F). Throw away any unused medicine after the expiration date. NOTE: This sheet is a summary. It may not cover all possible information. If you have questions about this medicine, talk to your doctor, pharmacist, or health care provider.  2012, [employer]vier/Gold Standard. (07/25/2010 8:56:09 AM)  Osteoporosis Throughout your life, your body breaks down old bone and replaces it with new bone. As you get older, your body does not  replace bone as quickly as it breaks it down. By the age of 30 years, most people begin to gradually lose bone because of the imbalance between bone loss and replacement. Some people lose more bone than others. Bone loss beyond a specified normal degree is considered osteoporosis.  Osteoporosis affects the strength and durability of your bones. The inside of the ends of your bones and your flat bones, like the bones of your pelvis, look like honeycomb, filled with tiny open spaces. As bone loss occurs, your bones become less dense. This means that the open spaces inside your bones become bigger and the walls between these spaces become thinner. This makes your bones weaker. Bones of a person with osteoporosis can become so weak that they can break (fracture) during minor accidents, such as a simple fall. CAUSES  The following factors have been associated with the development of osteoporosis:  Smoking.  Drinking more than 2 alcoholic drinks several days per week.  Long-term use of certain medicines:  Corticosteroids.  Chemotherapy medicines.  Thyroid medicines.  Antiepileptic medicines.  Gonadal hormone suppression medicine.  Immunosuppression medicine.  Being underweight.  Lack of physical activity.  Lack of exposure to  the sun. This can lead to vitamin D deficiency.  Certain medical conditions:  Certain inflammatory bowel diseases, such as Crohn's disease and ulcerative colitis.  Diabetes.  Hyperthyroidism.  Hyperparathyroidism. RISK FACTORS Anyone can develop osteoporosis. However, the following factors can increase your risk of developing osteoporosis:  Gender Women are at higher risk than men.  Age Being older than 50 years increases your risk.  Ethnicity White and Asian people have an increased risk.  Weight Being extremely underweight can increase your risk of osteoporosis.  Family history of osteoporosis Having a family member who has developed osteoporosis can  increase your risk. SYMPTOMS  Usually, people with osteoporosis have no symptoms.  DIAGNOSIS  Signs during a physical exam that may prompt your caregiver to suspect osteoporosis include:  Decreased height. This is usually caused by the compression of the bones that form your spine (vertebrae) because they have weakened and become fractured.  A curving or rounding of the upper back (kyphosis). To confirm signs of osteoporosis, your caregiver may request a procedure that uses 2 low-dose X-ray beams with different levels of energy to measure your bone mineral density (dual-energy X-ray absorptiometry [DXA]). Also, your caregiver may check your level of vitamin D. TREATMENT  The goal of osteoporosis treatment is to strengthen bones in order to decrease the risk of bone fractures. There are different types of medicines available to help achieve this goal. Some of these medicines work by slowing the processes of bone loss. Some medicines work by increasing bone density. Treatment also involves making sure that your levels of calcium and vitamin D are adequate. PREVENTION  There are things you can do to help prevent osteoporosis. Adequate intake of calcium and vitamin D can help you achieve optimal bone mineral density. Regular exercise can also help, especially resistance and weight-bearing activities. If you smoke, quitting smoking is an important part of osteoporosis prevention. MAKE SURE YOU:  Understand these instructions.  Will watch your condition.  Will get help right away if you are not doing well or get worse. Document Released: 11/05/2004 Document Revised: 01/13/2012 Document Reviewed: 01/10/2011 [facility] Patient Information 2014 [place], [place].

## 2012-11-29 NOTE — Assessment & Plan Note (Signed)
Refilled norco for November to December and December to January.   No more hycodan!

## 2012-11-30 ENCOUNTER — Telehealth: Payer: Self-pay | Admitting: *Deleted

## 2012-11-30 LAB — BASIC METABOLIC PANEL
CO2: 33 mEq/L — ABNORMAL HIGH (ref 19–32)
Glucose, Bld: 97 mg/dL (ref 70–99)
Potassium: 4.2 mEq/L (ref 3.5–5.3)
Sodium: 140 mEq/L (ref 135–145)

## 2012-11-30 NOTE — Telephone Encounter (Signed)
Call from pt for clarification - wants to know if Robitussin does not calm her cough esp at night; should she take another Hydrocodone (Norco) tablet. She states this was told to her yesterday at her office visit but wants to make sure this is correct.  Thanks

## 2012-11-30 NOTE — Telephone Encounter (Signed)
She should not double up on the pain medicine. The norco should be taken as prescribed and currently it seems to be at q6h prn. It lasts several hours so it should cover her enough and if she needs it closer to night time then she should adjust her prn schedule to give her coverage for that time.

## 2012-11-30 NOTE — Telephone Encounter (Signed)
Pt informed of Dr Elizbeth Squires response.

## 2012-12-06 ENCOUNTER — Other Ambulatory Visit: Payer: Self-pay | Admitting: *Deleted

## 2012-12-06 ENCOUNTER — Telehealth: Payer: Self-pay | Admitting: *Deleted

## 2012-12-06 DIAGNOSIS — F419 Anxiety disorder, unspecified: Secondary | ICD-10-CM

## 2012-12-06 DIAGNOSIS — F329 Major depressive disorder, single episode, unspecified: Secondary | ICD-10-CM

## 2012-12-06 MED ORDER — DIAZEPAM 2 MG PO TABS: 2.0000 mg | ORAL_TABLET | Freq: Two times a day (BID) | ORAL | Status: DC | PRN

## 2012-12-06 MED ORDER — FLUOXETINE HCL 20 MG PO CAPS: 60.0000 mg | ORAL_CAPSULE | Freq: Every day | ORAL | Status: DC

## 2012-12-06 NOTE — Telephone Encounter (Signed)
Pt made it very clear on phone that she wanted the zolpidem refilled, please advise

## 2012-12-06 NOTE — Telephone Encounter (Signed)
Called to pharm 

## 2012-12-06 NOTE — Telephone Encounter (Signed)
Looks like Remus Loffler was just a temporary fill. If she is on prozac and valium, does she still need ambien??

## 2012-12-06 NOTE — Telephone Encounter (Signed)
Okay we will refill for now and re-evaluate on future visit. Please send me another request.

## 2012-12-07 ENCOUNTER — Other Ambulatory Visit: Payer: Self-pay | Admitting: *Deleted

## 2012-12-07 ENCOUNTER — Other Ambulatory Visit: Payer: Self-pay | Admitting: Internal Medicine

## 2012-12-07 MED ORDER — ZOLPIDEM TARTRATE 5 MG PO TABS: 5.0000 mg | ORAL_TABLET | Freq: Every evening | ORAL | Status: DC | PRN

## 2012-12-07 MED ORDER — ZOLPIDEM TARTRATE 5 MG PO TABS
5.0000 mg | ORAL_TABLET | Freq: Every evening | ORAL | Status: DC | PRN
Start: 2012-12-07 — End: 2013-02-15

## 2012-12-07 NOTE — Telephone Encounter (Signed)
Called to [facility] 10/28

## 2012-12-07 NOTE — Telephone Encounter (Signed)
Ambien rx faxed to [facility].

## 2012-12-22 ENCOUNTER — Ambulatory Visit (INDEPENDENT_AMBULATORY_CARE_PROVIDER_SITE_OTHER): Payer: Medicare Other | Admitting: Internal Medicine

## 2012-12-22 ENCOUNTER — Encounter: Payer: Self-pay | Admitting: Internal Medicine

## 2012-12-22 VITALS — BP 135/78 | Temp 97.7°F | Resp 80 | Wt 215.9 lb

## 2012-12-22 DIAGNOSIS — M810 Age-related osteoporosis without current pathological fracture: Secondary | ICD-10-CM

## 2012-12-22 DIAGNOSIS — R059 Cough, unspecified: Secondary | ICD-10-CM

## 2012-12-22 DIAGNOSIS — R05 Cough: Secondary | ICD-10-CM

## 2012-12-22 MED ORDER — MOMETASONE FUROATE 50 MCG/ACT NA SUSP: 2.0000 | Freq: Every day | NASAL | Status: DC

## 2012-12-22 NOTE — Assessment & Plan Note (Signed)
Patient states occasional nausea with fosamax but otherwise going well.

## 2012-12-22 NOTE — Assessment & Plan Note (Signed)
Feel that her etiology of cough is multifactorial and that some component of allergic rhinitis is present, she has never been on nasal corticosteroid and will initiate at this visit. She was also advised to use a humidifier at night time. Suggested that if she stopped smoking her coughing may be decreased. She seemed to agree but does not really wish to quit as she enjoys smoking. She states she will talk to her therapist about it.

## 2012-12-22 NOTE — Progress Notes (Signed)
Subjective:     Patient ID: Kelsey Garcia, female   DOB: May 11, 1945, 67 y.o.   MRN: [id number]  HPI The patient is a 67 show female with history of COPD, insomnia, OCD, PTSD, depression, A. fib not on anticoagulation, tobacco abuse, chronic pain. She was seen recently for cough and her hydrocodone cough syrup was discontinued due to concern duplicate therapies with oral and pill form hydrocodone. The patient was advised to use Robitussin over-the-counter but per the patient she has tried 4 different over-the-counter cough syrups which were not effective or gave her side effects. She claims that she is having coughing fits multiple times per day lasting from several causes to 45 minutes. She gets more coughing fits in the middle of the night. She thinks it strictly related to the increase in allergy season the last 3 weeks. Her nasal congestion and dripping has been much more severe. She states that cough is mostly nonproductive however occasional flecks of blood in sputum. She states that these coughing fits have been affecting her sleep at nighttime. Overall her breathing is fairly good in the she is in the middle of a coughing fit.  Review of Systems  Constitutional: Negative for fever, chills, diaphoresis, activity change, appetite change, fatigue and unexpected weight change.  HENT: Positive for congestion, postnasal drip, rhinorrhea and sinus pressure. Negative for dental problem, drooling, ear discharge, ear pain, facial swelling, hearing loss, mouth sores, nosebleeds, sneezing, sore throat, tinnitus, trouble swallowing and voice change.   Respiratory: Positive for cough and wheezing. Negative for choking, chest tightness, shortness of breath and stridor.   Cardiovascular: Negative for chest pain, palpitations and leg swelling.  Gastrointestinal: Positive for nausea. Negative for vomiting, abdominal pain and diarrhea.       Occasional nausea with taking her bisphosphonate.  Musculoskeletal: Positive  for arthralgias.  Neurological: Negative for dizziness, tremors, seizures, syncope, facial asymmetry, speech difficulty, weakness, light-headedness, numbness and headaches.  Psychiatric/Behavioral: Positive for dysphoric mood, decreased concentration and agitation. Negative for suicidal ideas, hallucinations, behavioral problems, confusion, sleep disturbance and self-injury. The patient is nervous/anxious. The patient is not hyperactive.        Objective:   Physical Exam  Constitutional: She is oriented to person, place, and time. She appears well-developed and well-nourished. No distress.  Some aggressive behavior during our visit but she is easily calmed down with conversation.   HENT:  Head: Normocephalic and atraumatic.  Eyes: EOM are normal. Pupils are equal, round, and reactive to light.  Neck: Normal range of motion. Neck supple.  Cardiovascular: Normal rate.   Irregularly irregular  Pulmonary/Chest: Effort normal. No respiratory distress. She has wheezes. She has no rales. She exhibits no tenderness.  Some expiratory wheezing without focal rales or crackles.   Abdominal: Soft. Bowel sounds are normal. She exhibits no distension. There is no tenderness. There is no rebound.  Neurological: She is alert and oriented to person, place, and time. No cranial nerve deficit.  Skin: Skin is dry.  Some stigmata of scratching on her arms.        Assessment/Plan:    1. Please see problem oriented charting.  2. Disposition-patient be seen back as previously scheduled at the clinic. She was advised to stop smoking. She was offered flu shot however she did decline. She states that she intends to stop smoking however she is not ready at this time and she does enjoy smoking. She was advised to get a humidifier for her bedroom to help with dryness. She was  send him prescription for Nasonex to be used daily to help with her allergies and to decrease her coughing fits. Do not feel that this cough is  coming from her lungs as her lung exam is improved from previous or stable.  The patient was acting in a derogatory manner throughout our visit and spoke very negatively about her PCP and was using profanity about her. She was upset and states she presented a letter requesting PCP changes. She seems very fixated on continuing treatments from years ago because they were "started to be long term medications and not short term" and she felt the reason for the change of the hydrocodone syrup was that Dr. Virgina Organ does not like her and wanted to change things negatively. She was reassured that the danger of double doses of medications is very real she seemed flippant and sarcastic however was calmed down easily.

## 2012-12-22 NOTE — Progress Notes (Signed)
Case discussed with Dr. Qureshi soon after the resident saw the patient.  We reviewed the resident's history and exam and pertinent patient test results.  I agree with the assessment, diagnosis, and plan of care documented in the resident's note. 

## 2012-12-22 NOTE — Patient Instructions (Signed)
We will send in a medicine for you to use in your nose to help with the allergies and to decrease your cough. It is called nasonex and use 2 sprays in each nostril once a day. If your insurance will not pay for it call us and we will send in a similar medicine.   We also recommend using a humidifier in your room at night time to help with night coughing spells.   Come back as previously scheduled or call our clinic if you are not feeling better at [phone].  Allergic Rhinitis Allergic rhinitis is when the mucous membranes in the nose respond to allergens. Allergens are particles in the air that cause your body to have an allergic reaction. This causes you to release allergic antibodies. Through a chain of events, these eventually cause you to release histamine into the blood stream (hence the use of antihistamines). Although meant to be protective to the body, it is this release that causes your discomfort, such as frequent sneezing, congestion and an itchy runny nose.  CAUSES  The pollen allergens may come from grasses, trees, and weeds. This is seasonal allergic rhinitis, or "hay fever." Other allergens cause year-round allergic rhinitis (perennial allergic rhinitis) such as house dust mite allergen, pet dander and mold spores.  SYMPTOMS   Nasal stuffiness (congestion).  Runny, itchy nose with sneezing and tearing of the eyes.  There is often an itching of the mouth, eyes and ears. It cannot be cured, but it can be controlled with medications. DIAGNOSIS  If you are unable to determine the offending allergen, skin or blood testing may find it. TREATMENT   Avoid the allergen.  Medications and allergy shots (immunotherapy) can help.  Hay fever may often be treated with antihistamines in pill or nasal spray forms. Antihistamines block the effects of histamine. There are over-the-counter medicines that may help with nasal congestion and swelling around the eyes. Check with your caregiver  before taking or giving this medicine. If the treatment above does not work, there are many new medications your caregiver can prescribe. Stronger medications may be used if initial measures are ineffective. Desensitizing injections can be used if medications and avoidance fails. Desensitization is when a patient is given ongoing shots until the body becomes less sensitive to the allergen. Make sure you follow up with your caregiver if problems continue. SEEK MEDICAL CARE IF:   You develop fever (more than 100.5 F (38.1 C).  You develop a cough that does not stop easily (persistent).  You have shortness of breath.  You start wheezing.  Symptoms interfere with normal daily activities. Document Released: 10/21/2000 Document Revised: 04/20/2011 Document Reviewed: 05/02/2008 [facility] Patient Information 2014 [place], [place].

## 2012-12-26 NOTE — Progress Notes (Signed)
Case discussed with Dr. Kollar at the time of the visit.  We reviewed the resident's history and exam and pertinent patient test results.  I agree with the assessment, diagnosis, and plan of care documented in the resident's note.     

## 2012-12-30 ENCOUNTER — Telehealth: Payer: Self-pay | Admitting: *Deleted

## 2012-12-30 NOTE — Telephone Encounter (Signed)
Pt calls and states trial of nasonex of 1 week did no harm but did no good either. She states she will discuss with you at her next appt but she does say to please tell you she is miserable, so i suppose and answer would be good

## 2013-01-09 ENCOUNTER — Other Ambulatory Visit: Payer: Self-pay | Admitting: *Deleted

## 2013-01-10 MED ORDER — POTASSIUM CHLORIDE CRYS ER 20 MEQ PO TBCR: 10.0000 meq | EXTENDED_RELEASE_TABLET | Freq: Two times a day (BID) | ORAL | Status: DC

## 2013-01-18 ENCOUNTER — Ambulatory Visit (INDEPENDENT_AMBULATORY_CARE_PROVIDER_SITE_OTHER): Payer: Medicare Other | Admitting: Internal Medicine

## 2013-01-18 ENCOUNTER — Encounter: Payer: Self-pay | Admitting: Internal Medicine

## 2013-01-18 VITALS — BP 130/78 | HR 85 | Temp 96.7°F | Wt 212.9 lb

## 2013-01-18 DIAGNOSIS — J449 Chronic obstructive pulmonary disease, unspecified: Secondary | ICD-10-CM

## 2013-01-18 DIAGNOSIS — Z0184 Encounter for antibody response examination: Secondary | ICD-10-CM

## 2013-01-18 DIAGNOSIS — I4891 Unspecified atrial fibrillation: Secondary | ICD-10-CM

## 2013-01-18 DIAGNOSIS — M169 Osteoarthritis of hip, unspecified: Secondary | ICD-10-CM

## 2013-01-18 DIAGNOSIS — J42 Unspecified chronic bronchitis: Secondary | ICD-10-CM

## 2013-01-18 DIAGNOSIS — M1990 Unspecified osteoarthritis, unspecified site: Secondary | ICD-10-CM

## 2013-01-18 DIAGNOSIS — R05 Cough: Secondary | ICD-10-CM

## 2013-01-18 DIAGNOSIS — M1610 Unilateral primary osteoarthritis, unspecified hip: Secondary | ICD-10-CM

## 2013-01-18 DIAGNOSIS — Z79899 Other long term (current) drug therapy: Secondary | ICD-10-CM

## 2013-01-18 DIAGNOSIS — Z79891 Long term (current) use of opiate analgesic: Secondary | ICD-10-CM

## 2013-01-18 DIAGNOSIS — J4489 Other specified chronic obstructive pulmonary disease: Secondary | ICD-10-CM

## 2013-01-18 DIAGNOSIS — F419 Anxiety disorder, unspecified: Secondary | ICD-10-CM

## 2013-01-18 DIAGNOSIS — R053 Chronic cough: Secondary | ICD-10-CM

## 2013-01-18 DIAGNOSIS — I10 Essential (primary) hypertension: Secondary | ICD-10-CM

## 2013-01-18 DIAGNOSIS — F17200 Nicotine dependence, unspecified, uncomplicated: Secondary | ICD-10-CM

## 2013-01-18 DIAGNOSIS — F411 Generalized anxiety disorder: Secondary | ICD-10-CM

## 2013-01-18 DIAGNOSIS — F3289 Other specified depressive episodes: Secondary | ICD-10-CM

## 2013-01-18 DIAGNOSIS — F329 Major depressive disorder, single episode, unspecified: Secondary | ICD-10-CM

## 2013-01-18 MED ORDER — HYDROCODONE-ACETAMINOPHEN 7.5-325 MG PO TABS: 1.0000 | ORAL_TABLET | Freq: Four times a day (QID) | ORAL | Status: DC | PRN

## 2013-01-18 MED ORDER — HYDROCODONE-HOMATROPINE 5-1.5 MG/5ML PO SYRP: 5.0000 mL | ORAL_SOLUTION | Freq: Four times a day (QID) | ORAL | Status: DC | PRN

## 2013-01-18 NOTE — Assessment & Plan Note (Signed)
  Assessment: Progress toward smoking cessation:  smoking the same amount Barriers to progress toward smoking cessation:  lack of motivation to quit Comments: Patient appears to be pre-contemplative.  Plan: Instruction/counseling given:  I counseled patient on the dangers of tobacco use, advised patient to stop smoking, and reviewed strategies to maximize success. Medications to assist with smoking cessation:  None Patient agreed to the following self-care plans for smoking cessation: cut down the number of cigarettes smoked  Other plans: Patient was advised on the dangers of smoking cigarettes while on continuous home oxygen.

## 2013-01-18 NOTE — Assessment & Plan Note (Addendum)
Patient is concerned because she thinks she has never had the chickenpox, and asks me if she is a candidate for the vaccine. - Will check varicella titers today (IgG) - Based on results, will administer the chickenpox vaccine at future visit - She has also agreed to a tetanus shot at the same time if needed (declined in 2012), but does not want it today

## 2013-01-18 NOTE — Assessment & Plan Note (Signed)
Stable. We discussed the possibility of starting Wellbutrin, both for her depression and for smoking cessation. She became very anxious though at the thought of having to titrate off her Prozac. She would like to address this decision at future visit. - Continue Prozac 20 mg 3 times a day - Mental health followup at [facility] as discussed above.

## 2013-01-18 NOTE — Assessment & Plan Note (Addendum)
I am concerned about chronic bronchitis in this patient with chronic cough, evidence of lung disease on PFTs, and a long history of smoking. I think she would benefit from pulmonology followup. I am willing to prescribe a short course of hydrocodone cough syrup until she is able to be evaluated by a pulmonologist, provided the patient is well aware of the risks of respiratory depression and even death if she combines Norco with the syrup. The very best thing she could do for her chronic cough is to quit smoking, but unfortunately the patient is pre-contemplative at this time. She tells me she has set a quit date, but will not tell me what it is. - Outpatient referral to pulmonology - Counseled patient extensively on risks of respiratory depression with narcotic use - Recommended that the patient quit smoking; she is willing to discuss this at future visit but does not want to quit at this time - Continue Zyrtec, Symbicort, Singulair, and Spiriva, Protonix, Albuterol prn
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
## 2013-01-18 NOTE — Assessment & Plan Note (Signed)
Patient was given a three-month supply of her Norco 7.5-325 #120, to be filled on 02/17/2011, 03/18/2012, 04/14/2012. Patient was counseled again on the importance of not combining oral Norco with hydrocodone cough syrup due to the risks of respiratory depression, especially in someone with known lung disease. Patient was advised that we cannot replace her paper prescriptions if she loses them. She expressed understanding.

## 2013-01-18 NOTE — Assessment & Plan Note (Signed)
BP Readings from Last 3 Encounters:  01/18/13 130/78  12/22/12 135/78  11/29/12 120/71    Lab Results  Component Value Date   NA 140 11/29/2012   K 4.2 11/29/2012   CREATININE 0.89 11/29/2012    Assessment: Blood pressure control: controlled Progress toward BP goal:  at goal  Plan: Medications:  continue current medications
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
## 2013-01-18 NOTE — Assessment & Plan Note (Signed)
Patient has significant issues with anxiety, and would benefit from longitudinal psychiatric followup. She is willing to go back to [place] and seek care. I encouraged this. - Continue Valium 2 mg twice a day when necessary

## 2013-01-18 NOTE — Assessment & Plan Note (Signed)
Filed Vitals:   01/18/13 1510  BP: 130/78  Pulse: 85  Temp: 96.7 F (35.9 C)   Heart rate is controlled on diltiazem 420 mg daily.

## 2013-01-19 ENCOUNTER — Other Ambulatory Visit: Payer: Self-pay | Admitting: Internal Medicine

## 2013-01-19 DIAGNOSIS — Z0000 Encounter for general adult medical examination without abnormal findings: Secondary | ICD-10-CM

## 2013-01-19 NOTE — Progress Notes (Signed)
Her varicella serologies were negative. Will administer varicella vaccine and Tdap (which she refused in 2012) in a nursing visit to be scheduled . She also needs a flu shot, but has always refused this in the past. We discussed this at her last visit and she is amenable to getting both shots.  For the varicella shot, she will need two doses of 0.5 mL separated by ?4 weeks (4 to 8 weeks apart per ACIP).  Vivi Barrack, MD  [email or web address] Pager # [phone] Office # [phone]

## 2013-01-19 NOTE — Progress Notes (Signed)
I saw and evaluated the patient.  I personally confirmed the key portions of Dr. Cater's history and exam and reviewed pertinent patient test results.  The assessment, diagnosis, and plan were formulated together and I agree with the documentation in the resident's note. 

## 2013-01-25 ENCOUNTER — Other Ambulatory Visit: Payer: Self-pay | Admitting: Internal Medicine

## 2013-01-25 NOTE — Progress Notes (Signed)
Error

## 2013-02-13 ENCOUNTER — Other Ambulatory Visit: Payer: Self-pay | Admitting: *Deleted

## 2013-02-14 ENCOUNTER — Encounter: Payer: Medicare Other | Admitting: Internal Medicine

## 2013-02-15 MED ORDER — ZOLPIDEM TARTRATE 5 MG PO TABS: 5.0000 mg | ORAL_TABLET | Freq: Every evening | ORAL | Status: DC | PRN

## 2013-02-15 NOTE — Telephone Encounter (Signed)
Rx called in 

## 2013-02-15 NOTE — Telephone Encounter (Signed)
Pt wanted you to know that she will soon need cataract surgery on both eyes.

## 2013-03-01 ENCOUNTER — Ambulatory Visit (INDEPENDENT_AMBULATORY_CARE_PROVIDER_SITE_OTHER): Payer: Medicare Other | Admitting: Cardiovascular Disease

## 2013-03-01 ENCOUNTER — Encounter: Payer: Self-pay | Admitting: Cardiovascular Disease

## 2013-03-01 ENCOUNTER — Encounter: Payer: Self-pay | Admitting: *Deleted

## 2013-03-01 VITALS — BP 126/70 | HR 84 | Ht 63.0 in | Wt 213.0 lb

## 2013-03-01 DIAGNOSIS — I428 Other cardiomyopathies: Secondary | ICD-10-CM

## 2013-03-01 DIAGNOSIS — I4891 Unspecified atrial fibrillation: Secondary | ICD-10-CM

## 2013-03-01 DIAGNOSIS — F17200 Nicotine dependence, unspecified, uncomplicated: Secondary | ICD-10-CM

## 2013-03-01 DIAGNOSIS — I10 Essential (primary) hypertension: Secondary | ICD-10-CM

## 2013-03-01 NOTE — Progress Notes (Signed)
Patient ID: Kelsey Garcia, female   DOB: Dec 27, 1945, 68 y.o.   MRN: [id number] Kelsey Garcia is a 68 y.o. female who returns for follow up on CHF,  She is prior patient of Dr. Doreatha Lew  Hospitalized in  09/2010 for AFib. She has a hx of permanent AFib, s/p 2 failed DCCVs, diastolic CHF, COPD, HTN, HL, tobacco abuse, OCD. Echo in 2010 with normal LVF. Anticoagulation recommended  but patient declined (CHADS2-VASc=4). Last echo (07/2012): Normal LV wall thickness and motion, EF 60-65%, mild RAE. She was recently seen in follow up with her PCP and noted to have worsening LE edema. Lasix was increased with improvement according to the notes in her chart.  Patient notes her LE edema is improved since taking Lasix 80 bid. She has been trying to lose weight over the last 1 year (lost 30lbs). Also exercising at home. Denies exertional CP. Notes DOE that is chronic. Seems to be worse. She is NYHA Class IIb. No orthopnea or PND. No syncope.  On home oxygen for 3 years  Seeing Dr [name] next month.  Had pneumonia shot but routinely avoids flu shot  Labs (6/14): BNP 1190, TSH 1.098  Labs (8/14): K 3.5, Cr 1.06, HDL 43, LDL 117, Hgb 13.8      ROS: Denies fever, malais, weight loss, blurry vision, decreased visual acuity, cough, sputum, SOB, hemoptysis, pleuritic pain, palpitaitons, heartburn, abdominal pain, melena, lower extremity edema, claudication, or rash.  All other systems reviewed and negative  General: Affect appropriate Chronically ill white female  HEENT: normal Neck supple with no adenopathy JVP normal no bruits no thyromegaly Lungs clear with no wheezing and good diaphragmatic motion Heart:  S1/S2 no murmur, no rub, gallop or click PMI normal Abdomen: benighn, BS positve, no tenderness, no AAA no bruit.  No HSM or HJR Distal pulses intact with no bruits No edema Neuro non-focal Skin warm and dry No muscular weakness   Current Outpatient Prescriptions  Medication Sig Dispense Refill  .  albuterol (PROVENTIL HFA;VENTOLIN HFA) 108 (90 BASE) MCG/ACT inhaler Inhale 2 puffs into the lungs every 6 (six) hours as needed for wheezing.  8.5 g  6  . alendronate (FOSAMAX) 70 MG tablet Take 1 tablet (70 mg total) by mouth every 7 (seven) days. Take with a full glass of water on an empty stomach.  12 tablet  3  . aspirin 325 MG tablet Take 650 mg by mouth daily.       . budesonide-formoterol (SYMBICORT) 160-4.5 MCG/ACT inhaler Inhale 2 puffs into the lungs 2 (two) times daily.  10.2 g  11  . cetirizine (ZYRTEC) 10 MG tablet Take 10 mg by mouth 2 (two) times daily.      . diazepam (VALIUM) 2 MG tablet Take 1 tablet (2 mg total) by mouth every 12 (twelve) hours as needed for anxiety.  60 tablet  3  . diltiazem (TIAZAC) 420 MG 24 hr capsule Take 1 capsule (420 mg total) by mouth daily.  90 capsule  3  . FLUoxetine (PROZAC) 20 MG capsule Take 3 capsules (60 mg total) by mouth daily.  90 capsule  11  . furosemide (LASIX) 40 MG tablet Take 2 tablets (80 mg total) by mouth 2 (two) times daily.  120 tablet  11  . HYDROcodone-acetaminophen (NORCO) 7.5-325 MG per tablet Take 1 tablet by mouth every 6 (six) hours as needed.  120 tablet  0  . HYDROcodone-homatropine (HYCODAN) 5-1.5 MG/5ML syrup Take 5 mLs by mouth every 6 (  six) hours as needed for cough.  240 mL  0  . montelukast (SINGULAIR) 10 MG tablet Take 1 tablet (10 mg total) by mouth at bedtime.  90 tablet  3  . nystatin (MYCOSTATIN) 100000 UNIT/ML suspension Take 5 mL (500,000 Units total) by mouth 4 (four) times daily.  60 mL  0  . pantoprazole (PROTONIX) 40 MG tablet Take 1 tablet (40 mg total) by mouth 2 (two) times daily.  60 tablet  11  . potassium chloride SA (K-DUR,KLOR-CON) 20 MEQ tablet Take 0.5 tablets (10 mEq total) by mouth 2 (two) times daily.  30 tablet  6  . tiotropium (SPIRIVA) 18 MCG inhalation capsule Place 1 capsule (18 mcg total) into inhaler and inhale daily.  90 capsule  3  . zolpidem (AMBIEN) 5 MG tablet Take 1 tablet (5 mg  total) by mouth at bedtime as needed for sleep.  30 tablet  3   No current facility-administered medications for this visit.    Allergies  Ace inhibitors; Diphenhydramine hcl; Erythromycin; Nsaids; Nyquil; Tramadol hcl; and Pseudoephedrine  Electrocardiogram:  afib nonspecific ST/T wave changes   Assessment and Plan

## 2013-03-01 NOTE — Assessment & Plan Note (Signed)
Euvolemic Functional class limited by COPD  STable

## 2013-03-01 NOTE — Assessment & Plan Note (Signed)
Well controlled.  Continue current medications and low sodium Dash type diet.    

## 2013-03-01 NOTE — Assessment & Plan Note (Signed)
She has no motivation to quit Has discussed with her psychiatrist and part of her OCD  She realizes it is the reason she is on oxygen

## 2013-03-01 NOTE — Assessment & Plan Note (Signed)
Good rate control  Refused anticoagulation Chronic and stable

## 2013-03-01 NOTE — Patient Instructions (Signed)
Your physician wants you to follow-up in: YEAR WITH DR NISHAN  You will receive a reminder letter in the mail two months in advance. If you don't receive a letter, please call our office to schedule the follow-up appointment.  Your physician recommends that you continue on your current medications as directed. Please refer to the Current Medication list given to you today. 

## 2013-03-15 ENCOUNTER — Ambulatory Visit (INDEPENDENT_AMBULATORY_CARE_PROVIDER_SITE_OTHER): Payer: Medicare Other | Admitting: Pulmonary Disease

## 2013-03-15 ENCOUNTER — Encounter: Payer: Self-pay | Admitting: Pulmonary Disease

## 2013-03-15 VITALS — BP 124/78 | HR 83 | Temp 98.8°F | Wt 216.6 lb

## 2013-03-15 DIAGNOSIS — J4489 Other specified chronic obstructive pulmonary disease: Secondary | ICD-10-CM

## 2013-03-15 DIAGNOSIS — J449 Chronic obstructive pulmonary disease, unspecified: Secondary | ICD-10-CM

## 2013-03-15 MED ORDER — HYDROCODONE-HOMATROPINE 5-1.5 MG/5ML PO SYRP: 5.0000 mL | ORAL_SOLUTION | Freq: Four times a day (QID) | ORAL | Status: DC | PRN
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
## 2013-03-15 NOTE — Patient Instructions (Addendum)
We will give you Rx for hycodan 120 ml x 24months Refills form your PCP You have to commit to quit smoking over hte next 6-12 mnths Stay on [place]

## 2013-03-19 NOTE — Assessment & Plan Note (Signed)
We had a frank conversation She is really here only for codeine prescription and does not want to establish long-term care with today. I frankly told her that treatment of her cough would work much better with smoking cessation. She was initially unwilling to commit to quit attempt and said that she would get her prescription from somewhere else. After some more discussion we decided-  We will give you Rx for hycodan 120 ml x 4months Refills from your PCP You have to commit to quit smoking over hte next 6-12 mnths. Refills should not be continued if she does not make a sincere attempt Stay on spiriva & symbicort I also have strongly advised her not to take Hycodan when she takes her hydrocodone Further FU with her PCP

## 2013-04-12 ENCOUNTER — Encounter: Payer: Medicare Other | Admitting: Internal Medicine

## 2013-04-19 ENCOUNTER — Ambulatory Visit (INDEPENDENT_AMBULATORY_CARE_PROVIDER_SITE_OTHER): Payer: Medicare Other | Admitting: Internal Medicine

## 2013-04-19 ENCOUNTER — Encounter: Payer: Self-pay | Admitting: Internal Medicine

## 2013-04-19 VITALS — BP 131/81 | HR 85 | Temp 97.0°F | Ht 65.0 in | Wt 213.9 lb

## 2013-04-19 DIAGNOSIS — Z79891 Long term (current) use of opiate analgesic: Secondary | ICD-10-CM

## 2013-04-19 DIAGNOSIS — J449 Chronic obstructive pulmonary disease, unspecified: Secondary | ICD-10-CM

## 2013-04-19 DIAGNOSIS — M169 Osteoarthritis of hip, unspecified: Secondary | ICD-10-CM

## 2013-04-19 DIAGNOSIS — Z79899 Other long term (current) drug therapy: Secondary | ICD-10-CM

## 2013-04-19 DIAGNOSIS — F17200 Nicotine dependence, unspecified, uncomplicated: Secondary | ICD-10-CM

## 2013-04-19 DIAGNOSIS — M1990 Unspecified osteoarthritis, unspecified site: Secondary | ICD-10-CM

## 2013-04-19 DIAGNOSIS — I4891 Unspecified atrial fibrillation: Secondary | ICD-10-CM

## 2013-04-19 DIAGNOSIS — M1610 Unilateral primary osteoarthritis, unspecified hip: Secondary | ICD-10-CM

## 2013-04-19 MED ORDER — HYDROCODONE-HOMATROPINE 5-1.5 MG/5ML PO SYRP: 5.0000 mL | ORAL_SOLUTION | Freq: Four times a day (QID) | ORAL | Status: DC | PRN

## 2013-04-19 MED ORDER — HYDROCODONE-ACETAMINOPHEN 7.5-325 MG PO TABS: 1.0000 | ORAL_TABLET | Freq: Four times a day (QID) | ORAL | Status: DC | PRN

## 2013-04-19 NOTE — Assessment & Plan Note (Signed)
Heart rate is controlled on diltiazem 420 mg daily. She has refused anticoagulation. - Continue followup with [name]

## 2013-04-19 NOTE — Assessment & Plan Note (Signed)
This is really her primary problem. She does not have a quit date for me today. She tells me she is working with her therapist on her PTSD and her OCD before committing to quit. She says her therapist told her she needs to wait to quit smoking for now, as it is part of her OCD. I am not sure about the veracity of her therapist telling her this... but I'm glad she is seeing a mental health provider. She later tells me she wants her weight down to 200 pounds before she quits.  We discussed Chantix given new evidence that it can help smokers who use a "reduce to quit" approach instead of stopping tobacco cold [place], but this is probably not a good idea in this patient with several severe mental health issues.  I told her that no matter what, at her next visit in 3 months, she needs to come prepared with a plan to quit smoking. If this means meeting with her therapist more, so be it. If I don't see signs of progress, I will not refill her Hycodan.

## 2013-04-19 NOTE — Assessment & Plan Note (Addendum)
Patient was given a three-month supply of her Norco 7.5-325 #120, to be filled on 05/16/2013, 06/16/2013, 07/18/2013.   Patient was counseled again on the importance of not combining oral Norco with hydrocodone cough syrup due to the risks of respiratory depression, especially in someone with known lung disease.  I advised her that next visit she needs to get with her therapist and come prepared with a solid plan to quit smoking, otherwise I will not continue to refill her Hycodan cough syrup. She knows that she eventually needs to be weaned off this.  I suspect compromise and building a trusting relationship will be the only way to enact change with this patient. I am working towards this.

## 2013-04-19 NOTE — Progress Notes (Signed)
Subjective:    Patient ID: Kelsey Garcia, female    DOB: [birth year], 68 y.o.   MRN: [id number]  HPI  Kelsey Garcia is a 68 y.o. female with history of COPD on home oxygen, insomnia, OCD, PTSD, depression, A. fib not on anticoagulation, tobacco abuse, chronic pain who is here for routine follow up. She is a difficult patient who refuses to quit smoking despite being on home oxygen for her COPD, and left her prior PCP for me because they would not refill her Hycodan cough syrup.  Last visit, as a condition for me to refill her Hycodan, I asked that she followup with a pulmonologist and with a mental health provider about her depression, OCD, and tobacco abuse.  She saw [name] (pulmonology) on 2/3. Apparently she was very frank about the purpose of her visit being only to get Hycodan. She did not want to establish long term care with him. In the end they compromised: he gave her Hycodan 120 ml with no further refills, she has to commit to quit smoking over the next 6-12 months, refills (from me as her PCP) will not be continued if she does not make a sincere attempt to quit smoking. She will stay on Spiriva & Symbicort. She was strongly advised her not to take Hycodan when she takes her hydrocodone.   She tells me she has been following up with [facility] mental health as we agreed. Dr. Pauline Good is her psychiatrist and Dr. Bard Herbert is her therapist. They are working on her PTSD. She tells me that her therapist says they have "things to work through" before she can commit to quit smoking, but the plan is to do so in 6-12 months.  She tells me she had cataract surgery on Friday. She says that since receiving anesthesia for it, she feels "dissociated from her emotions." She does not think she is sad, and she does not think she is suicidal, but her emotions feel like they "are on the other side of the room." I explored this with her, asking if she thinks this is a coping mechanism. She insists it is strictly  "because the anesthesia" and the feeling will wear off in a few days.  She saw [name] for her atrial fibrillation on 1/21. He noted good rate control. She has refused anticoagulation in the past.   She asks me for refills for her hydrocodone and her Hycodan.   Current Outpatient Prescriptions on File Prior to Visit  Medication Sig Dispense Refill  . albuterol (PROVENTIL HFA;VENTOLIN HFA) 108 (90 BASE) MCG/ACT inhaler Inhale 2 puffs into the lungs every 6 (six) hours as needed for wheezing.  8.5 g  6  . aspirin 325 MG tablet Take 650 mg by mouth daily.       . budesonide-formoterol (SYMBICORT) 160-4.5 MCG/ACT inhaler Inhale 2 puffs into the lungs 2 (two) times daily.  10.2 g  11  . cetirizine (ZYRTEC) 10 MG tablet Take 10 mg by mouth 2 (two) times daily.      . diazepam (VALIUM) 2 MG tablet Take 1 tablet (2 mg total) by mouth every 12 (twelve) hours as needed for anxiety.  60 tablet  3  . diltiazem (TIAZAC) 420 MG 24 hr capsule Take 1 capsule (420 mg total) by mouth daily.  90 capsule  3  . FLUoxetine (PROZAC) 20 MG capsule Take 3 capsules (60 mg total) by mouth daily.  90 capsule  11  . furosemide (LASIX) 40 MG  tablet Take 2 tablets (80 mg total) by mouth 2 (two) times daily.  120 tablet  11  . HYDROcodone-acetaminophen (NORCO) 7.5-325 MG per tablet Take 1 tablet by mouth every 6 (six) hours as needed.  120 tablet  0  . HYDROcodone-homatropine (HYCODAN) 5-1.5 MG/5ML syrup Take 5 mLs by mouth every 6 (six) hours as needed for cough.  120 mL  0  . montelukast (SINGULAIR) 10 MG tablet Take 1 tablet (10 mg total) by mouth at bedtime.  90 tablet  3  . nystatin (MYCOSTATIN) 100000 UNIT/ML suspension Take 500,000 Units by mouth as needed.      . pantoprazole (PROTONIX) 40 MG tablet Take 1 tablet (40 mg total) by mouth 2 (two) times daily.  60 tablet  11  . potassium chloride SA (K-DUR,KLOR-CON) 20 MEQ tablet Take 0.5 tablets (10 mEq total) by mouth 2 (two) times daily.  30 tablet  6  . tiotropium  (SPIRIVA) 18 MCG inhalation capsule Place 1 capsule (18 mcg total) into inhaler and inhale daily.  90 capsule  3  . zolpidem (AMBIEN) 5 MG tablet Take 1 tablet (5 mg total) by mouth at bedtime as needed for sleep.  30 tablet  3    Review of Systems  Constitutional: Negative for fever and chills.  HENT: Negative for rhinorrhea.   Eyes: Negative for visual disturbance.  Respiratory: Positive for cough (Chronic, productive of white sputum, Hycodan is the only thing that makes it better) and wheezing (At home, but none currently). Negative for chest tightness.   Gastrointestinal: Negative for abdominal pain.  Genitourinary: Negative for dysuria.  Musculoskeletal: Negative for arthralgias.  Neurological: Positive for dizziness. Negative for weakness and headaches.  Psychiatric/Behavioral: Negative for suicidal ideas, behavioral problems, dysphoric mood and agitation.      Objective:   Physical Exam  Constitutional: She is oriented to person, place, and time. She appears well-developed and well-nourished. No distress.  Still with some aggressive behavior during our visit, such as when I suggest we eventually need to stop the Hycodan, but she is easily calmed down with reassurance and conversation.  HENT:  Head: Normocephalic and atraumatic.  Eyes: EOM are normal. Pupils are equal, round, and reactive to light.  Neck: Normal range of motion. Neck supple.  Cardiovascular: Normal rate.  Irregularly irregular  Pulmonary/Chest: On portable oxygen by nasal cannula. Effort normal. No respiratory distress. No wheezes. She has no rales. Moving air well. She exhibits no tenderness.  Abdominal: Soft. Bowel sounds are normal. She exhibits no distension. There is no tenderness. There is no rebound.  Neurological: She is alert and oriented to person, place, and time. No cranial nerve deficit.   Filed Vitals:   04/19/13 1347  BP: 131/81  Pulse: 85  Temp: 97 F (36.1 C)      Assessment & Plan:    Please see problem based charting.

## 2013-04-19 NOTE — Patient Instructions (Signed)
Thank you for your visit. - Please continue to followup with [facility] behavioral health. I think this will be very important going forward both to treat your mental health issues and also to help you quit smoking. - I still firmly believe that quitting smoking will be the best thing you can do for your health. At your next visit in 3 months, we will discuss again the plan for quitting smoking. Please come to this appointment prepared to talk about next steps and/or a quit date. - I recommend you continue to talk to your therapist about the plan for quitting smoking. - I have prescribed you 3 month's of hydrocodone tablets as well as 3 months of hydrocodone syrup. As we discussed, it is very important that you do not take these 2 medicines together. Doing so can cause respiratory depression and death. - At a future visit, we will address again the possibility of stopping or weaning off your Hycodan. - Please return to see me in 3 months.

## 2013-04-19 NOTE — Assessment & Plan Note (Signed)
Stable, no signs of exacerbation. Her lung exam was negative for wheezing, rales, increased I:E ratio. She does endorse a chronic productive cough, but this is stable. - Continue Symbicort, Spiriva, albuterol when necessary, Zyrtec, Singulair

## 2013-04-20 NOTE — Progress Notes (Signed)
Case discussed with Dr. Cater at the time of the visit.  We reviewed the resident's history and exam and pertinent patient test results.  I agree with the assessment, diagnosis, and plan of care documented in the resident's note.      

## 2013-05-04 ENCOUNTER — Other Ambulatory Visit: Payer: Self-pay | Admitting: Internal Medicine

## 2013-05-04 ENCOUNTER — Other Ambulatory Visit: Payer: Self-pay | Admitting: *Deleted

## 2013-05-04 DIAGNOSIS — Z1231 Encounter for screening mammogram for malignant neoplasm of breast: Secondary | ICD-10-CM

## 2013-05-04 DIAGNOSIS — I10 Essential (primary) hypertension: Secondary | ICD-10-CM

## 2013-05-04 MED ORDER — DILTIAZEM HCL ER BEADS 420 MG PO CP24: 420.0000 mg | ORAL_CAPSULE | Freq: Every day | ORAL | Status: DC

## 2013-05-18 ENCOUNTER — Ambulatory Visit (HOSPITAL_COMMUNITY): Payer: Medicare Other

## 2013-06-01 ENCOUNTER — Ambulatory Visit (HOSPITAL_COMMUNITY)
Admission: RE | Admit: 2013-06-01 | Discharge: 2013-06-01 | Disposition: A | Discharge status: Home / Self Care | Payer: Medicare Other | Source: Ambulatory Visit | Attending: Internal Medicine | Admitting: Internal Medicine

## 2013-06-01 DIAGNOSIS — Z1231 Encounter for screening mammogram for malignant neoplasm of breast: Secondary | ICD-10-CM

## 2013-06-05 ENCOUNTER — Other Ambulatory Visit: Payer: Self-pay | Admitting: *Deleted

## 2013-06-05 MED ORDER — BUDESONIDE-FORMOTEROL FUMARATE 160-4.5 MCG/ACT IN AERO: 2.0000 | INHALATION_SPRAY | Freq: Two times a day (BID) | RESPIRATORY_TRACT | Status: DC

## 2013-07-05 ENCOUNTER — Other Ambulatory Visit: Payer: Self-pay | Admitting: *Deleted

## 2013-07-05 DIAGNOSIS — J449 Chronic obstructive pulmonary disease, unspecified: Secondary | ICD-10-CM

## 2013-07-05 MED ORDER — TIOTROPIUM BROMIDE MONOHYDRATE 18 MCG IN CAPS: 18.0000 ug | ORAL_CAPSULE | Freq: Every day | RESPIRATORY_TRACT | Status: DC

## 2013-07-18 ENCOUNTER — Other Ambulatory Visit: Payer: Self-pay | Admitting: *Deleted

## 2013-07-18 NOTE — Telephone Encounter (Signed)
error 

## 2013-07-26 ENCOUNTER — Ambulatory Visit: Payer: Medicare Other | Admitting: Internal Medicine

## 2013-07-26 ENCOUNTER — Encounter: Payer: Medicare Other | Admitting: Internal Medicine

## 2013-07-27 ENCOUNTER — Encounter: Payer: Self-pay | Admitting: Internal Medicine

## 2013-07-27 ENCOUNTER — Ambulatory Visit (INDEPENDENT_AMBULATORY_CARE_PROVIDER_SITE_OTHER): Payer: Medicare Other | Admitting: Internal Medicine

## 2013-07-27 VITALS — BP 121/64 | HR 89 | Temp 97.5°F | Ht 65.0 in | Wt 219.2 lb

## 2013-07-27 DIAGNOSIS — M1990 Unspecified osteoarthritis, unspecified site: Secondary | ICD-10-CM

## 2013-07-27 DIAGNOSIS — M1610 Unilateral primary osteoarthritis, unspecified hip: Secondary | ICD-10-CM

## 2013-07-27 DIAGNOSIS — Z79899 Other long term (current) drug therapy: Secondary | ICD-10-CM

## 2013-07-27 DIAGNOSIS — M169 Osteoarthritis of hip, unspecified: Secondary | ICD-10-CM

## 2013-07-27 DIAGNOSIS — F17200 Nicotine dependence, unspecified, uncomplicated: Secondary | ICD-10-CM

## 2013-07-27 DIAGNOSIS — Z79891 Long term (current) use of opiate analgesic: Secondary | ICD-10-CM

## 2013-07-27 MED ORDER — HYDROCODONE-HOMATROPINE 5-1.5 MG/5ML PO SYRP: 5.0000 mL | ORAL_SOLUTION | Freq: Four times a day (QID) | ORAL | Status: DC | PRN

## 2013-07-27 MED ORDER — HYDROCODONE-ACETAMINOPHEN 7.5-325 MG PO TABS: 1.0000 | ORAL_TABLET | Freq: Four times a day (QID) | ORAL | Status: DC | PRN

## 2013-07-27 NOTE — Progress Notes (Signed)
Subjective:    Patient ID: Kelsey Garcia, female    DOB: November 15, 1945, 68 y.o.   MRN: [id number]  HPI Ms. Brigandi is a 68 yo woman pmh as listed below presents for acute visit to fill out paperwork.  The patient brings in several pages needed by her advanced home care for her oxygen compressor. These need to be filled out and forwarded to her pulmonologist in for her to be able to continue receiving oxygen at home. The patient has not noticed any new symptoms above her baseline such as increased dyspnea on exertion or shortness of breath. She does continue to smoke but has formulated a "concrete plan" as instructed by her former PCP Dr. Lucila Maine and she brings it in the be scanned and placed in her chart to be able to start the hydrocodone cough syrup taper.   Past Medical History  Diagnosis Date  . Atrial fibrillation   . Hypertension   . Insomnia   . Nonischemic cardiomyopathy   . Hyperlipidemia   . Anxiety   . Cholelithiasis   . Insomnia   . Mitral regurgitation     noted 2010  . H/O epistaxis   . Rhinitis, allergic   . CHF (congestive heart failure)   . Pneumonia     "several times w/exacerbations of the COPD; nothing in the last year" (07/15/2012)  . COPD (chronic obstructive pulmonary disease)     as of 7/13 on 2-3L, pfts 10/2008 with mod obstruction  . Chronic bronchitis with COPD (chronic obstructive pulmonary disease)   . Shortness of breath     "all the time right now" (07/15/2012)  . GERD (gastroesophageal reflux disease)   . ZHGDJMEQ(683.4)     "weekly" (07/15/2012)  . Migraines     "weekly for awhile; cleared up as I got older" (07/15/2012)  . DJD (degenerative joint disease)   . Arthritis     "all over" (07/15/2012)  . OCD (obsessive compulsive disorder)   . OCD (obsessive compulsive disorder)   . Depression     h/o SI; "last time I was really serious about it was ~ 1997" (07/15/2012)   Current Outpatient Prescriptions on File Prior to Visit  Medication Sig Dispense Refill  .  albuterol (PROVENTIL HFA;VENTOLIN HFA) 108 (90 BASE) MCG/ACT inhaler Inhale 2 puffs into the lungs every 6 (six) hours as needed for wheezing.  8.5 g  6  . aspirin 325 MG tablet Take 650 mg by mouth daily.       . budesonide-formoterol (SYMBICORT) 160-4.5 MCG/ACT inhaler Inhale 2 puffs into the lungs 2 (two) times daily.  10.2 g  11  . cetirizine (ZYRTEC) 10 MG tablet Take 10 mg by mouth 2 (two) times daily.      . diazepam (VALIUM) 2 MG tablet Take 1 tablet (2 mg total) by mouth every 12 (twelve) hours as needed for anxiety.  60 tablet  3  . diltiazem (TIAZAC) 420 MG 24 hr capsule Take 1 capsule (420 mg total) by mouth daily.  90 capsule  3  . FLUoxetine (PROZAC) 20 MG capsule Take 3 capsules (60 mg total) by mouth daily.  90 capsule  11  . furosemide (LASIX) 40 MG tablet Take 2 tablets (80 mg total) by mouth 2 (two) times daily.  120 tablet  11  . HYDROcodone-homatropine (HYCODAN) 5-1.5 MG/5ML syrup Take 5 mLs by mouth every 6 (six) hours as needed for cough.  120 mL  0  . montelukast (SINGULAIR) 10 MG tablet Take  1 tablet (10 mg total) by mouth at bedtime.  90 tablet  3  . nystatin (MYCOSTATIN) 100000 UNIT/ML suspension Take 500,000 Units by mouth as needed.      . pantoprazole (PROTONIX) 40 MG tablet Take 1 tablet (40 mg total) by mouth 2 (two) times daily.  60 tablet  11  . potassium chloride SA (K-DUR,KLOR-CON) 20 MEQ tablet Take 0.5 tablets (10 mEq total) by mouth 2 (two) times daily.  30 tablet  6  . tiotropium (SPIRIVA) 18 MCG inhalation capsule Place 1 capsule (18 mcg total) into inhaler and inhale daily.  90 capsule  4  . zolpidem (AMBIEN) 5 MG tablet Take 1 tablet (5 mg total) by mouth at bedtime as needed for sleep.  30 tablet  3   No current facility-administered medications on file prior to visit.   Social, surgical, family history reviewed with patient and updated in appropriate chart locations.   Review of Systems  History obtained from chart review and the patient General ROS:  negative for - chills, fatigue, fever or weight gain Respiratory ROS: no cough, shortness of breath, or wheezing above her baseline but still present Cardiovascular ROS: no chest pain or dyspnea on exertion     Objective:   Physical Exam Filed Vitals:   07/27/13 1044  BP: 121/64  Pulse: 89  Temp: 97.5 F (36.4 C)   General: sitting in chair, NAD HEENT: no scleral icterus, MMM Cardiac: RRR, no rubs, murmurs or gallops Pulm: expiratory wheezes, and some decreased BS in bases, no crackles or rhonchi, moving normal volumes of air Ext: warm and well perfused, no pedal edema Neuro: alert and oriented X3, cranial nerves II-XII grossly intact    Assessment & Plan:  Please see problem oriented charting  Pt discussed with Dr. Lynnae January

## 2013-07-27 NOTE — Patient Instructions (Signed)
General Instructions:   Please bring your medicines with you each time you come to clinic.  Medicines may include prescription medications, over-the-counter medications, herbal remedies, eye drops, vitamins, or other pills.   Progress Toward Treatment Goals:  Treatment Goal 01/18/2013  Blood pressure at goal  Stop smoking smoking the same amount    Self Care Goals & Plans:  Self Care Goal 01/18/2013  Manage my medications take my medicines as prescribed; bring my medications to every visit  Monitor my health keep track of my blood pressure  Eat healthy foods -  Be physically active -  Stop smoking cut down the number of cigarettes smoked    No flowsheet data found.   Care Management & Community Referrals:  Referral 09/23/2012  Referrals made for care management support none needed  Referrals made to community resources -

## 2013-07-27 NOTE — Assessment & Plan Note (Signed)
  Assessment: Progress toward smoking cessation:   has know written down a plan Barriers to progress toward smoking cessation:    Comments: pt still not ready to quit seems to have it for psychological reasons in terms of pt perspective of stabilizing her  moods  Plan: Instruction/counseling given:  I counseled patient on the dangers of tobacco use, advised patient to stop smoking, and reviewed strategies to maximize success. Educational resources provided:    Self management tools provided:    Medications to assist with smoking cessation:  None Patient agreed to the following self-care plans for smoking cessation:    Other plans: pt can and should start putting concrete goals to meet smoking cessation along with cough syrup taper

## 2013-07-27 NOTE — Assessment & Plan Note (Signed)
Patient was given a three-month supply for her Norco 7.5-325#120 to be refilled on 08/17/2013, 09/16/2013, 10/16/2013.  The patient will followup in one month to build a new relationship with a primary care Kelsey Garcia along with discussing the need to start tapering Hycodan cough syrup now that she has actually formulated a "solid plan to quit. Please reference the media tab to see the patient's written contract that her therapist helped her to formulate.

## 2013-07-27 NOTE — Progress Notes (Signed)
Case discussed with Dr. Sadek soon after the resident saw the patient.  We reviewed the resident's history and exam and pertinent patient test results.  I agree with the assessment, diagnosis, and plan of care documented in the resident's note. 

## 2013-08-04 ENCOUNTER — Other Ambulatory Visit: Payer: Self-pay | Admitting: *Deleted

## 2013-08-04 ENCOUNTER — Other Ambulatory Visit: Payer: Self-pay | Admitting: Internal Medicine

## 2013-08-04 DIAGNOSIS — J449 Chronic obstructive pulmonary disease, unspecified: Secondary | ICD-10-CM

## 2013-08-04 MED ORDER — ALBUTEROL SULFATE HFA 108 (90 BASE) MCG/ACT IN AERS: 2.0000 | INHALATION_SPRAY | Freq: Four times a day (QID) | RESPIRATORY_TRACT | Status: DC | PRN

## 2013-09-04 ENCOUNTER — Other Ambulatory Visit: Payer: Self-pay | Admitting: Internal Medicine

## 2013-09-06 ENCOUNTER — Other Ambulatory Visit: Payer: Self-pay | Admitting: *Deleted

## 2013-09-06 MED ORDER — PANTOPRAZOLE SODIUM 40 MG PO TBEC: 40.0000 mg | DELAYED_RELEASE_TABLET | Freq: Two times a day (BID) | ORAL | Status: DC

## 2013-09-06 MED ORDER — FUROSEMIDE 40 MG PO TABS: 80.0000 mg | ORAL_TABLET | Freq: Two times a day (BID) | ORAL | Status: DC

## 2013-10-12 ENCOUNTER — Ambulatory Visit (INDEPENDENT_AMBULATORY_CARE_PROVIDER_SITE_OTHER): Payer: Medicare Other | Admitting: Internal Medicine

## 2013-10-12 ENCOUNTER — Encounter: Payer: Self-pay | Admitting: Internal Medicine

## 2013-10-12 VITALS — BP 137/81 | HR 73 | Temp 97.0°F | Ht 65.0 in | Wt 216.5 lb

## 2013-10-12 DIAGNOSIS — R05 Cough: Secondary | ICD-10-CM

## 2013-10-12 DIAGNOSIS — K219 Gastro-esophageal reflux disease without esophagitis: Secondary | ICD-10-CM

## 2013-10-12 DIAGNOSIS — J4489 Other specified chronic obstructive pulmonary disease: Secondary | ICD-10-CM

## 2013-10-12 DIAGNOSIS — I509 Heart failure, unspecified: Secondary | ICD-10-CM

## 2013-10-12 DIAGNOSIS — Z0000 Encounter for general adult medical examination without abnormal findings: Secondary | ICD-10-CM

## 2013-10-12 DIAGNOSIS — R059 Cough, unspecified: Secondary | ICD-10-CM

## 2013-10-12 DIAGNOSIS — F411 Generalized anxiety disorder: Secondary | ICD-10-CM

## 2013-10-12 DIAGNOSIS — R053 Chronic cough: Secondary | ICD-10-CM

## 2013-10-12 DIAGNOSIS — M1990 Unspecified osteoarthritis, unspecified site: Secondary | ICD-10-CM

## 2013-10-12 DIAGNOSIS — F17200 Nicotine dependence, unspecified, uncomplicated: Secondary | ICD-10-CM

## 2013-10-12 DIAGNOSIS — F419 Anxiety disorder, unspecified: Secondary | ICD-10-CM

## 2013-10-12 DIAGNOSIS — E785 Hyperlipidemia, unspecified: Secondary | ICD-10-CM

## 2013-10-12 DIAGNOSIS — G4700 Insomnia, unspecified: Secondary | ICD-10-CM

## 2013-10-12 DIAGNOSIS — J449 Chronic obstructive pulmonary disease, unspecified: Secondary | ICD-10-CM

## 2013-10-12 DIAGNOSIS — I5032 Chronic diastolic (congestive) heart failure: Secondary | ICD-10-CM

## 2013-10-12 MED ORDER — HYDROCODONE-ACETAMINOPHEN 7.5-325 MG PO TABS: 1.0000 | ORAL_TABLET | Freq: Four times a day (QID) | ORAL | Status: DC | PRN

## 2013-10-12 MED ORDER — HYDROCODONE-HOMATROPINE 5-1.5 MG/5ML PO SYRP: 5.0000 mL | ORAL_SOLUTION | Freq: Four times a day (QID) | ORAL | Status: DC | PRN

## 2013-10-12 NOTE — Assessment & Plan Note (Signed)
Doing well with occasional ambien.

## 2013-10-12 NOTE — Assessment & Plan Note (Addendum)
Still continues to smoke. Is planning to quit. Has been having some increased sputum and cough. Has been using rescue inhaler daily. No wheezing on exam. No accessory muscle use. Doesn't seem to have exacerbation.   Will continue current spiriva, symbicort, singulair, zyrtec, and albuterol as needed. Encouraged smoking cessation.  Has yearly follow up with pulm on February 2016.  Patient uses 2L 02 at home. But she didn't desat below 93% during ambulation at room air. She does have symptoms of OSA: day time tiredness/sleepiness, snoring reported by family. Will order sleep study and see if she needs O2 nocturnally.

## 2013-10-12 NOTE — Assessment & Plan Note (Addendum)
Is working with therapist at [facility]. Has appt on October 8th, 2015. Has been discussing patch and wellbutrin. Has not started any treatment yet. Will f/up in 2 months to see how that's been working for her.told her that we will not refill her cough medicine if she doesn't have a solid plan for quitting.  Will get records from [facility].

## 2013-10-12 NOTE — Progress Notes (Addendum)
Subjective:    Patient ID: Kelsey Garcia, female    DOB: 06-17-1945, 68 y.o.   MRN: [id number]  HPI  68 yo female with hx of copd, diastolic chf, chronic cough, depression, anxiety, tobacco abuse comes in for follow up.  She is having chronic cough, with some increased sputum production, white-green in color. No fever/chills. Has baseline sob. Uses 2L o2 at home. Has been wheezing occasionally. She takes hycodan cough syrup at night when cough is very bad. Has some runny nose. She saw [name] on February 2015 and Dr. although has given her until February 2016 to quit smoking. She is working with her therapist at [facility] to quit smoking and also about reducing her anxiety and depression.  Her anxiety and depression is under control with prozac and diazepam. She takes spiriva, zyrtec, singulair, and PRN albuterol inhaler. Has been using albuterol inhaler about once a day.   She continues to have chronic joint paint on her knees, hips, and shoulders bilaterally, somewhat increasing in severity. Wants to go up on the pain meds.   No increased leg swelling. Has been using lasix 80mg  BID. Has chronic palpitations, takes dilt for chronic afib.  Denies fever/chest pain/diarrhea, n/v, constipation. Denies dysuria. denies sick contacts, recent infection.    Has daytime sleepiness and daughter reported she snores. Never had sleep study done before.  Review of Systems  Constitutional: Negative for fever, diaphoresis, activity change, appetite change and fatigue.  HENT: Positive for rhinorrhea. Negative for congestion, drooling, ear pain, hearing loss, sinus pressure, sneezing, sore throat, tinnitus and voice change.   Eyes: Negative.   Respiratory: Positive for cough, shortness of breath and wheezing.   Cardiovascular: Positive for palpitations. Negative for chest pain and leg swelling.  Gastrointestinal: Negative.   Endocrine: Negative.   Genitourinary: Negative.   Musculoskeletal: Positive for  arthralgias, back pain and myalgias.  Skin: Negative.   Allergic/Immunologic: Negative.   Neurological: Negative.   Psychiatric/Behavioral: Positive for dysphoric mood. The patient is nervous/anxious.        Objective:   Physical Exam  Constitutional: She is oriented to person, place, and time. She appears well-developed and well-nourished. No distress.  HENT:  Head: Normocephalic and atraumatic.  Right Ear: External ear normal.  Left Ear: External ear normal.  Nose: Nose normal.  Mouth/Throat: Oropharynx is clear and moist.  Eyes: Conjunctivae and EOM are normal. Pupils are equal, round, and reactive to light. Right eye exhibits no discharge. Left eye exhibits no discharge. No scleral icterus.  Neck: Normal range of motion. Neck supple. No JVD present. No thyromegaly present.  Cardiovascular: Normal rate, regular rhythm, S1 normal, S2 normal, normal heart sounds and intact distal pulses.  Exam reveals no gallop and no friction rub.   No murmur heard. Pulmonary/Chest: Effort normal and breath sounds normal. No accessory muscle usage. Not tachypneic. No respiratory distress. She has no wheezes. She has no rales. She exhibits no tenderness.  Good air movement. No wheezing. No accessory muscle use. Using 2L o2 [place] during exam.  Abdominal: Soft. Bowel sounds are normal. She exhibits no distension and no mass. There is no tenderness. There is no rebound and no guarding.  Musculoskeletal: Normal range of motion. She exhibits no edema and no tenderness.  Lymphadenopathy:    She has no cervical adenopathy.  Neurological: She is alert and oriented to person, place, and time. She has normal strength and normal reflexes. No cranial nerve deficit or sensory deficit.  Skin: Skin is warm  and dry. No rash noted. She is not diaphoretic. No erythema. No pallor.  Psychiatric: She has a normal mood and affect. Her behavior is normal.       Assessment & Plan:  See problem based a&P.

## 2013-10-12 NOTE — Assessment & Plan Note (Signed)
Doing well with PRN valium.

## 2013-10-12 NOTE — Assessment & Plan Note (Signed)
Continue lasix. No increased swelling. Doing well with current lasix dose 80mg  BID.

## 2013-10-12 NOTE — Patient Instructions (Signed)
It was a pleasure meeting you. Please follow up in 2 months. Please keep working with your therapist about your depression/anxiety and also about smoking cessation. We will refill your cough medicine again today but you must show [place] progress in terms of smoking cessation.

## 2013-10-12 NOTE — Assessment & Plan Note (Signed)
Continue PPI. Controlled with med.

## 2013-10-12 NOTE — Assessment & Plan Note (Signed)
Likely from COPD. Has been taking hycodan for cough. Patient care barely sleep at night if she doesn't take it bcause of severe cough. Will give her 1 more refill today 148ml hycadan. She has to show [place] she has a solid plan for quitting smoking else we will not refill this in the future. Pulmonology [name] gave her until feb 2016 to quit smoking.

## 2013-10-12 NOTE — Addendum Note (Signed)
Addended by: Dellia Nims on: 10/12/2013 03:07 PM   Modules accepted: Orders

## 2013-10-12 NOTE — Assessment & Plan Note (Signed)
Will repeat lipid profile today. Last LDL was 117, given <10% CAD risk no statin was started that time.

## 2013-10-13 LAB — BASIC METABOLIC PANEL WITH GFR
BUN: 12 mg/dL (ref 6–23)
CHLORIDE: 96 meq/L (ref 96–112)
CO2: 30 mEq/L (ref 19–32)
CREATININE: 0.91 mg/dL (ref 0.50–1.10)
Calcium: 9 mg/dL (ref 8.4–10.5)
GFR, EST NON AFRICAN AMERICAN: 66 mL/min
GFR, Est African American: 76 mL/min
Glucose, Bld: 102 mg/dL — ABNORMAL HIGH (ref 70–99)
POTASSIUM: 3.7 meq/L (ref 3.5–5.3)
Sodium: 140 mEq/L (ref 135–145)

## 2013-10-13 LAB — LIPID PANEL
Cholesterol: 194 mg/dL (ref 0–200)
HDL: 52 mg/dL (ref >39)
LDL Cholesterol: 127 mg/dL — ABNORMAL HIGH (ref 0–99)
Total CHOL/HDL Ratio: 3.7 Ratio
Triglycerides: 76 mg/dL (ref <150)
VLDL: 15 mg/dL (ref 0–40)

## 2013-10-13 NOTE — Progress Notes (Signed)
I saw and evaluated the patient.  I personally confirmed the key portions of the history and exam documented by Dr. Genene Churn and I reviewed pertinent patient test results.  The assessment, diagnosis, and plan were formulated together and I agree with the documentation in the resident's note.

## 2013-10-31 ENCOUNTER — Other Ambulatory Visit: Payer: Self-pay | Admitting: *Deleted

## 2013-10-31 ENCOUNTER — Other Ambulatory Visit: Payer: Self-pay | Admitting: Internal Medicine

## 2013-10-31 MED ORDER — HYDROCODONE-HOMATROPINE 5-1.5 MG/5ML PO SYRP: 5.0000 mL | ORAL_SOLUTION | Freq: Four times a day (QID) | ORAL | Status: DC | PRN

## 2013-10-31 NOTE — Telephone Encounter (Signed)
Pt states she has enough to last for tomorrow; has a bad cough- tried honey and lemon. Thanks

## 2013-11-06 ENCOUNTER — Other Ambulatory Visit: Payer: Self-pay | Admitting: Internal Medicine

## 2013-11-13 ENCOUNTER — Ambulatory Visit (INDEPENDENT_AMBULATORY_CARE_PROVIDER_SITE_OTHER): Payer: Medicare Other | Admitting: Internal Medicine

## 2013-11-13 ENCOUNTER — Encounter: Payer: Self-pay | Admitting: Internal Medicine

## 2013-11-13 VITALS — BP 129/67 | HR 77 | Temp 98.3°F | Wt 218.8 lb

## 2013-11-13 DIAGNOSIS — Z720 Tobacco use: Secondary | ICD-10-CM

## 2013-11-13 DIAGNOSIS — F17200 Nicotine dependence, unspecified, uncomplicated: Secondary | ICD-10-CM

## 2013-11-13 DIAGNOSIS — R05 Cough: Secondary | ICD-10-CM

## 2013-11-13 DIAGNOSIS — R053 Chronic cough: Secondary | ICD-10-CM

## 2013-11-13 MED ORDER — HYDROCODONE-HOMATROPINE 5-1.5 MG/5ML PO SYRP: 5.0000 mL | ORAL_SOLUTION | Freq: Four times a day (QID) | ORAL | Status: DC | PRN

## 2013-11-13 MED ORDER — CHLORPHENIRAMINE MALEATE 4 MG PO TABS: 4.0000 mg | ORAL_TABLET | Freq: Three times a day (TID) | ORAL | Status: DC | PRN

## 2013-11-13 NOTE — Addendum Note (Signed)
Addended by: Marcelino Duster on: 11/13/2013 02:19 PM   Modules accepted: Orders

## 2013-11-13 NOTE — Progress Notes (Signed)
Subjective:    Patient ID: Kelsey Garcia, female    DOB: 1945-09-25, 68 y.o.   MRN: [id number]  HPI  68 yo female with hx of copd, diastolic chf, chronic cough, depression, anxiety, tobacco abuse comes in for cough syrup refill.   She has been continuing to cough since our last visit. Had a cold since the last time and required some more cough syrup than usual. Cough is at her chronic stage without change. It is now dry, had some sputum previously. States she has bad allergies and taking zyrtec. Also has bad GERD and taking protonix. Coughing worse at nights. Has been using her hycodan mainly at nights to be able to sleep. Is careful not to use it together with her hydrocodone pain med. Cough has been going on for >5 years. Denies chest pain, has SOB that's at her baseline. Uses 2L oxygen continuously. Has SOB with exertion. No fever or chills. No hemopytysis. No significant weight loss. Has chronic back pain, b/l knee pain, shoulder pain. Wants to discuss her chronic pain on the next visit.  She states she continues to smoke 0.5ppd, has been working with her therapist at [facility] but it's hard for her to quit.    Review of Systems  Constitutional: Negative for fever, chills, activity change, appetite change and fatigue.  HENT: Negative for congestion, drooling, ear discharge, ear pain, nosebleeds, postnasal drip, rhinorrhea, sinus pressure and sore throat.   Eyes: Negative for pain, discharge and visual disturbance.  Respiratory: Positive for cough, shortness of breath and wheezing. Negative for chest tightness.   Cardiovascular: Positive for leg swelling. Negative for chest pain and palpitations.  Gastrointestinal: Negative for nausea, diarrhea, constipation, abdominal distention and anal bleeding.  Genitourinary: Negative for dysuria, urgency, hematuria, decreased urine volume and difficulty urinating.  Musculoskeletal: Positive for arthralgias, back pain and myalgias.  Skin: Negative.     Allergic/Immunologic: Negative.   Neurological: Negative for dizziness, seizures, syncope, speech difficulty, weakness, light-headedness, numbness and headaches.  Hematological: Negative.   Psychiatric/Behavioral: The patient is nervous/anxious.        Objective:   Physical Exam  Constitutional: She is oriented to person, place, and time. She appears well-developed and well-nourished. She appears distressed.  Coughing throughout the interview.  HENT:  Head: Normocephalic and atraumatic.  Right Ear: External ear normal.  Left Ear: External ear normal.  Nose: Nose normal.  Mouth/Throat: Oropharynx is clear and moist.  Eyes: Conjunctivae and EOM are normal. Pupils are equal, round, and reactive to light. Right eye exhibits no discharge. Left eye exhibits no discharge. No scleral icterus.  Neck: Normal range of motion. Neck supple. No JVD present. No thyromegaly present.  Cardiovascular: Normal rate, regular rhythm, S1 normal, S2 normal, normal heart sounds and intact distal pulses.  Exam reveals no gallop and no friction rub.   No murmur heard. Pulmonary/Chest: Effort normal and breath sounds normal. No respiratory distress. She has no wheezes. She has no rales. She exhibits no tenderness.  Expiratory wheezing diffusely. No crackles. Using 2L n/c.  Abdominal: Soft. Bowel sounds are normal. She exhibits no distension and no mass. There is no tenderness. There is no rebound and no guarding.  Musculoskeletal: Normal range of motion. She exhibits no edema and no tenderness.  TTP on both knees, L>R.  1+ pitting edema BLE upto shins.  Lymphadenopathy:    She has no cervical adenopathy.  Neurological: She is alert and oriented to person, place, and time. She has normal strength and normal  reflexes. No cranial nerve deficit or sensory deficit.  Skin: No rash noted. She is not diaphoretic. No erythema. No pallor.  Psychiatric: Her behavior is normal.        Assessment & Plan:  See  problem based a&p.

## 2013-11-13 NOTE — Assessment & Plan Note (Addendum)
chronic cough with multiple etiologies.  nodular opacity seen on 07/13/12 but not seen 07/14/12. sometimes this can be missed on CXR. we will get one today to make sure malignancy is not a cause of her cough.  she is not on aCEI or ARB so that's not an issue.  has GERD and is managed with 40mg  BID protonix  she has allergies. on singulair and zyrtec. this could be an issue so we will try chlorpheniramine (CHLOR-TRIMETON) 4 MG tablet q8hr PRN allergies. asked her to get off zyrtec if this works better.  other etiologies include COPD and smoking. DDx also includes non-asthmatic easinophilic bronchitis.   refilled hycodan cough syrup. chronic cough is very debiliating so we will continue to give her this despite risk of sedation and other side effects with his opiate pain meds. This will be the plan going forward despite how she does with her smoking cessation efforts.   encouraged smoking cessation. getting records from [place] about his progress with her therapist.

## 2013-11-13 NOTE — Assessment & Plan Note (Addendum)
Working with her therapist at [facility]. Nurse was given the contact info. We will try to get their records.  Encouraged smoking cessation and explained this is likely the etiology of her cough.  Currently smoking 0.5 PPD.

## 2013-11-13 NOTE — Patient Instructions (Signed)
Please try taking Chlorphenamine tablets 4 mg every 8 hour as needed for your allergies. If it helps, then you can stop taking your other allergy medicine.  Keep taking all of your other medicines.  We will get a chest xray to evaluate your cough.  Follow up as needed. Refilled your cough syrup for 1 month.  General Instructions:   Please bring your medicines with you each time you come to clinic.  Medicines may include prescription medications, over-the-counter medications, herbal remedies, eye drops, vitamins, or other pills.   Progress Toward Treatment Goals:  Treatment Goal 01/18/2013  Blood pressure at goal  Stop smoking smoking the same amount    Self Care Goals & Plans:  Self Care Goal 10/12/2013  Manage my medications take my medicines as prescribed; bring my medications to every visit; refill my medications on time  Monitor my health keep track of my blood pressure  Eat healthy foods eat more vegetables; eat foods that are low in salt; eat baked foods instead of fried foods  Be physically active find an activity I enjoy  Stop smoking go to the [employer] (https://scott-booker.info/); set a quit date and stop smoking    No flowsheet data found.   Care Management & Community Referrals:  Referral 09/23/2012  Referrals made for care management support none needed  Referrals made to community resources -

## 2013-11-15 NOTE — Progress Notes (Signed)
I saw and evaluated the patient.  I personally confirmed the key portions of Dr. Brandt Loosen history and exam and reviewed pertinent patient test results.  The assessment, diagnosis, and plan were formulated together and I agree with the documentation in the resident's note.  Will work on common causes of cough as outlined in Dr. Brandt Loosen note.  I suspect the continued cough is most likely related to the continued smoking although an upper airway cough syndrome source not completely histamine mediated may also be contributing.  She did not stop for the CXR as ordered to look for a structural abnormality that could explain the cough.  Agree with the chlorpheniramine antihistamine, more for its anticholinergic properties, in managing the cough.  Will continue to encourage smoking cessation.

## 2013-11-29 ENCOUNTER — Ambulatory Visit (HOSPITAL_COMMUNITY)
Admission: RE | Admit: 2013-11-29 | Discharge: 2013-11-29 | Disposition: A | Discharge status: Home / Self Care | Payer: Medicare Other | Source: Ambulatory Visit | Attending: Internal Medicine | Admitting: Internal Medicine

## 2013-11-29 DIAGNOSIS — R05 Cough: Secondary | ICD-10-CM

## 2013-11-29 DIAGNOSIS — R053 Chronic cough: Secondary | ICD-10-CM

## 2013-11-29 DIAGNOSIS — J449 Chronic obstructive pulmonary disease, unspecified: Secondary | ICD-10-CM | POA: Diagnosis not present

## 2013-12-01 ENCOUNTER — Other Ambulatory Visit: Payer: Self-pay | Admitting: Internal Medicine

## 2013-12-01 ENCOUNTER — Other Ambulatory Visit: Payer: Self-pay | Admitting: *Deleted

## 2013-12-04 ENCOUNTER — Other Ambulatory Visit: Payer: Self-pay | Admitting: *Deleted

## 2013-12-05 MED ORDER — PANTOPRAZOLE SODIUM 40 MG PO TBEC: DELAYED_RELEASE_TABLET | ORAL | Status: DC

## 2013-12-05 MED ORDER — MONTELUKAST SODIUM 10 MG PO TABS: 10.0000 mg | ORAL_TABLET | Freq: Every day | ORAL | Status: DC

## 2013-12-05 MED ORDER — FUROSEMIDE 40 MG PO TABS: ORAL_TABLET | ORAL | Status: DC

## 2013-12-06 ENCOUNTER — Telehealth: Payer: Self-pay | Admitting: Cardiovascular Disease

## 2013-12-06 ENCOUNTER — Telehealth: Payer: Self-pay | Admitting: Pulmonary Disease

## 2013-12-06 NOTE — Telephone Encounter (Signed)
New message    Patient calling need to speak with nurse regarding CHF. Her insurance is planning on stopping her coverage - billed medicare with additional information.

## 2013-12-06 NOTE — Telephone Encounter (Signed)
Carry returned call. Carry confirmed the need for qualifying sats. Appt changed to 11/11 with TP for qualification and to have the sats within 30 days of OV note and sleep study. Pt aware of appt change. Will need OV note and sats faxed to [phone] with attn to [employer]. Nothing further at this time.

## 2013-12-06 NOTE — Telephone Encounter (Signed)
Called and spoke to pt. Pt stating she is being denied coverage by medicare to cover her O2 because she does not have qualifying sats documented. Pt placed on the injection schedule to be walked on 11/4. Called and left message for [employer], at [facility], to call back regarding pt's saturation documentation within [facility] system. Pt aware of appt time and date.

## 2013-12-11 ENCOUNTER — Encounter (HOSPITAL_BASED_OUTPATIENT_CLINIC_OR_DEPARTMENT_OTHER): Payer: Medicare Other

## 2013-12-11 ENCOUNTER — Ambulatory Visit (HOSPITAL_BASED_OUTPATIENT_CLINIC_OR_DEPARTMENT_OTHER): Payer: Medicare Other | Attending: Internal Medicine | Admitting: Radiology

## 2013-12-11 VITALS — Ht 65.0 in | Wt 210.0 lb

## 2013-12-11 DIAGNOSIS — G4733 Obstructive sleep apnea (adult) (pediatric): Secondary | ICD-10-CM

## 2013-12-11 DIAGNOSIS — I5032 Chronic diastolic (congestive) heart failure: Secondary | ICD-10-CM

## 2013-12-11 DIAGNOSIS — J449 Chronic obstructive pulmonary disease, unspecified: Secondary | ICD-10-CM

## 2013-12-11 DIAGNOSIS — G4730 Sleep apnea, unspecified: Secondary | ICD-10-CM | POA: Diagnosis not present

## 2013-12-12 NOTE — Telephone Encounter (Signed)
PER PT  MEDICARE  IS TRYING  TO  DC  O 2 THERAPY ON  PT'S  MESSAGE  HAS  BEEN ADDRESSED  PT HAS HAD  SLEEP STUDY  AND O 2  VALUE DROPPED  DURING  STUDY  AND  O 2  HAD  TO BE STARTED PT  AWARE  MAY  CALL  IF NEEDS ANY  FURTHER  ASSISTANCE .Adonis Housekeeper

## 2013-12-13 ENCOUNTER — Ambulatory Visit: Payer: Medicare Other

## 2013-12-16 ENCOUNTER — Ambulatory Visit (HOSPITAL_BASED_OUTPATIENT_CLINIC_OR_DEPARTMENT_OTHER): Payer: Medicare Other | Admitting: Internal Medicine

## 2013-12-16 DIAGNOSIS — G4733 Obstructive sleep apnea (adult) (pediatric): Secondary | ICD-10-CM

## 2013-12-16 NOTE — Sleep Study (Signed)
   NAME: Kelsey Garcia DATE OF BIRTH:  [birth year] MEDICAL RECORD NUMBER [id number]  LOCATION: [facility]  PHYSICIAN: YOUNG,CLINTON D  DATE OF STUDY: 12/11/2013  SLEEP STUDY TYPE: Nocturnal Polysomnogram               REFERRING PHYSICIAN: Dellia Nims, MD  INDICATION FOR STUDY: hypersomnia with sleep apnea  EPWORTH SLEEPINESS SCORE:  5/24 HEIGHT: 5\' 5"  (165.1 cm)  WEIGHT: 210 lb (95.255 kg)    Body mass index is 34.95 kg/(m^2).  NECK SIZE: 15 in.  MEDICATIONS: charted for review  SLEEP ARCHITECTURE: total sleep time 328.5 minutes with sleep efficiency 81.5%. Stage I was 9.7%, stage II 82.5%, stage III 2%, REM 5.8% of total sleep time. Sleep latency 15 minutes, REM latency 210 minutes, awake after sleep onset 60 minutes, arousal index 21.4, bedtime medication: Chlorpheniramine, buspirone, hydrocodone-acetaminophen, aspirin  RESPIRATORY DATA: Apnea/Hypopneas index (AHI) 2.9 per hour. 16 total events scored including 4 obstructive apneas and 12 hypopneas. Events were not positional. REM AHI 3.2 per hour. There were not enough events to permit split protocol CPAP titration.  OXYGEN DATA: moderate snoring with oxygen desaturation to a nadir of 80% and mean saturation 90.8% on room air.  CARDIAC DATA: sinus rhythm with PVCs  MOVEMENT/PARASOMNIA: no significant movement disturbance, bathroom 3  IMPRESSION/ RECOMMENDATION:   1) Within normal limits with occasional respiratory events associated with sleep disturbance, AHI 2.9 per hour. The normal range for adults is an AHI from 0-5 events per hour). Moderate snoring. 2) Room air oxygen saturation on arrival was 86%. Room air oxygen desaturation at the beginning of the study to 80%. Supplemental oxygen was begun at 1 L/m at 10:53 PM. Mean oxygen saturation through the study on 1 L supplemental oxygen was 90.8%. A total of 38.4 minutes was recorded with oxygen saturation less than 88%, primarily on room air before  supplemental oxygen. 3) Sleep pattern was marked by frequent awakening, including 3 bathroom trips, despite the bedtime medications listed above.   Deneise Lever Diplomate, American Board of Sleep Medicine  ELECTRONICALLY SIGNED ON:  12/16/2013, 11:31 AM [place] PH: [phone]   FX: [phone] [place]

## 2013-12-18 ENCOUNTER — Encounter: Payer: Self-pay | Admitting: Internal Medicine

## 2013-12-18 ENCOUNTER — Ambulatory Visit (INDEPENDENT_AMBULATORY_CARE_PROVIDER_SITE_OTHER): Payer: Medicare Other | Admitting: Internal Medicine

## 2013-12-18 VITALS — BP 115/75 | HR 77 | Temp 98.2°F | Ht 65.0 in | Wt 214.2 lb

## 2013-12-18 DIAGNOSIS — M1610 Unilateral primary osteoarthritis, unspecified hip: Secondary | ICD-10-CM

## 2013-12-18 DIAGNOSIS — M542 Cervicalgia: Secondary | ICD-10-CM | POA: Insufficient documentation

## 2013-12-18 DIAGNOSIS — R053 Chronic cough: Secondary | ICD-10-CM

## 2013-12-18 DIAGNOSIS — R05 Cough: Secondary | ICD-10-CM

## 2013-12-18 MED ORDER — CYCLOBENZAPRINE HCL 5 MG PO TABS: 5.0000 mg | ORAL_TABLET | Freq: Three times a day (TID) | ORAL | Status: DC | PRN

## 2013-12-18 MED ORDER — HYDROCODONE-HOMATROPINE 5-1.5 MG/5ML PO SYRP: 5.0000 mL | ORAL_SOLUTION | Freq: Four times a day (QID) | ORAL | Status: DC | PRN

## 2013-12-18 MED ORDER — HYDROCODONE-ACETAMINOPHEN 7.5-325 MG PO TABS: 1.0000 | ORAL_TABLET | Freq: Four times a day (QID) | ORAL | Status: DC | PRN

## 2013-12-18 NOTE — Assessment & Plan Note (Signed)
Has neck pain 1 month without neuro deficits. On right trap/scalene muscle area. Muscle ttp. Pain worsens throughotu the day. Not helped by norco. Will try felexiril.

## 2013-12-18 NOTE — Patient Instructions (Signed)
Try taking flexiril for your neck pain. Filled script for 3 months of hycodan and norco. Continue working on smoking cessation. Remind your therapist to send your records.

## 2013-12-18 NOTE — Progress Notes (Signed)
   Subjective:    Patient ID: Kelsey Garcia, female    DOB: May 12, 1945, 68 y.o.   MRN: [id number]  HPI  68 yo female with afib (refused anticoag), chronic cough, copd, tobacco abuse, diastolic CHF, depression, anxiety, and here for follow up. Has chronic back pain (DJD, right hip). Also has hand and neck pain. Pain starts on the neck and shoots to her head and both hands. Starts after waking up in the morning and progressive gets worse through the day. Has been taking her norco 7.5-325 4 times daily without relief. No numbness/tingling/weakness.   osteoporesis - on fosamax.  Chronic cough - on hycodan. cxr showed no cardiopulmonary changes. We added chlorpheniramine (CHLOR-TRIMETON) 4 MG tablet q8hr PRN allergies (was on zyrtec before).  Tobacco abuse: continues to smoke 1 pack a day. Working with therapist to quit but doesn't wwant any therapy now.    Review of Systems  Constitutional: Negative for fever, chills, activity change, fatigue and unexpected weight change.  HENT: Negative for congestion, ear discharge, postnasal drip, sinus pressure, sneezing and sore throat.   Eyes: Negative.   Respiratory: Positive for cough. Negative for choking, chest tightness, shortness of breath and wheezing.   Cardiovascular: Negative.   Gastrointestinal: Negative.   Endocrine: Negative.   Genitourinary: Negative.   Musculoskeletal: Positive for myalgias, back pain and neck pain. Negative for joint swelling, arthralgias, gait problem and neck stiffness.  Skin: Negative.   Neurological: Positive for headaches. Negative for dizziness, seizures, syncope, facial asymmetry, speech difficulty, weakness and numbness.  Hematological: Negative.   Psychiatric/Behavioral: The patient is nervous/anxious.        Objective:   Physical Exam  Constitutional: She is oriented to person, place, and time. She appears well-developed and well-nourished. No distress.  HENT:  Head: Normocephalic and atraumatic.  Right  Ear: External ear normal.  Left Ear: External ear normal.  Nose: Nose normal.  Mouth/Throat: Oropharynx is clear and moist.  Eyes: Conjunctivae and EOM are normal. Pupils are equal, round, and reactive to light. Right eye exhibits no discharge. Left eye exhibits no discharge. No scleral icterus.  Neck: Normal range of motion. Neck supple. No JVD present.  Cardiovascular: Normal rate and regular rhythm.  Exam reveals no gallop and no friction rub.   No murmur heard. Pulmonary/Chest: Effort normal and breath sounds normal. No stridor. No respiratory distress. She has no wheezes. She has no rales. She exhibits no tenderness.  Abdominal: Soft. Bowel sounds are normal. She exhibits no distension and no mass. There is no tenderness. There is no rebound and no guarding.  Musculoskeletal:  Has TTP on right trap/SCL muscle group. No numbness/ weakness/ loss of sensation.  Lymphadenopathy:    She has no cervical adenopathy.  Neurological: She is alert and oriented to person, place, and time. She has normal reflexes.  Skin: Skin is warm. She is not diaphoretic.  Psychiatric: She has a normal mood and affect.         Assessment & Plan:  See problem based a&p.

## 2013-12-18 NOTE — Assessment & Plan Note (Signed)
Cough somewhat better with clorpheniramine. Still had bad days. Hasn't quit smoking. Smokes 1 ppd.   Likely from smoking. Managing her gerd, not on ace i. cxr normal recently.  Will continue hycodan syrup. Filled 3 months. Has appt with [name] pulm in 2 days.  Encouraged smoking cessation.

## 2013-12-18 NOTE — Assessment & Plan Note (Signed)
Refilled norco for 3 months on 12/18/2013. Pain is chronic without change. Patient wanted to go up on the dose. Asked her to try flexiril and see if it helps. If not, will likely refer her to pain clinic next time.

## 2013-12-20 ENCOUNTER — Ambulatory Visit (INDEPENDENT_AMBULATORY_CARE_PROVIDER_SITE_OTHER): Payer: Medicare Other | Admitting: Adult Health

## 2013-12-20 ENCOUNTER — Encounter: Payer: Self-pay | Admitting: Adult Health

## 2013-12-20 VITALS — BP 108/60 | HR 93 | Ht 65.0 in | Wt 216.8 lb

## 2013-12-20 DIAGNOSIS — I5032 Chronic diastolic (congestive) heart failure: Secondary | ICD-10-CM

## 2013-12-20 DIAGNOSIS — J9611 Chronic respiratory failure with hypoxia: Secondary | ICD-10-CM

## 2013-12-20 DIAGNOSIS — J9610 Chronic respiratory failure, unspecified whether with hypoxia or hypercapnia: Secondary | ICD-10-CM | POA: Insufficient documentation

## 2013-12-20 DIAGNOSIS — J449 Chronic obstructive pulmonary disease, unspecified: Secondary | ICD-10-CM

## 2013-12-20 NOTE — Assessment & Plan Note (Signed)
Compensated with flare on current regimen  No changes

## 2013-12-20 NOTE — Progress Notes (Signed)
Subjective:    Patient ID: Kelsey Garcia, female    DOB: September 21, 1945, 68 y.o.   MRN: [id number]  HPI  89/ F, smoker presents for FU of COPD.  She has a h/o opiate abuse in the 12's. She is not prescribed codeine containing medications due to her addiction hisotry.  She reports dyspnea on usual activity & states that she would like abetter quality of life. CXR 12/09 >> hyperinflation. Sh e was hospitalised in 2004 & she lost 37 lbs of water weight.  Atrovent worked great, spiriva did not work as well. Advair helps in th emorning but not in the afternoon. It caused thrush.    03/15/2013  Chief Complaint  Patient presents with  . Advice Only    last seen 07/2008. F/u on COPD. Breathing is fine about 80% of the time-good and bad days. Late afternoons is the worse. Pt has cough from her GERD, rhinitis and her COPD. When related to allergies it is normally dry.    Returns for follow up after 5 years She was quite frank with me and admitted that the only reason she was here was so that she could get a prescription for codeine cough syrup which her PCP had not given her. She reports dyspnea class 2-3 is able to carry out her activities of daily living. He continues to smoke 0.5-1 pack per day and is not willing to commit to a quit attempt. She reports nasal congestion and denies wheezing, orthopnea paroxysmal nocturnal dyspnea or pedal edema. She is a dry cough sometimes keeps her up at night She is maintained on a regimen of Symbicort, spray for, Singulair and as needed albuterol. She also takes hydrocodone pills for DJD  PFT's 10/2012 show FEV1/FVC = 71% predicted, FEV1 = 49% predicted, DLCO 39% predicted >cont on spiriva and symbicort   12/20/2013 Follow up COPD  Returns for follow up for COPD  Remains on Symbicort and Spiriva .  She has chronic hypoxic resp failure on 2l/m continuous .  Has portable concentrator -Sim[employer] -Respironics.  Sats on RA 86% , on 2l/m sats 94%  Still smoking ,  advised on smoking cessation.  Does not smoke around oxygen  Advised on dangers of oxygen and smoking .  Does not want to quit, but is trying . Is working with  Therapist.  Uses SABA rarely. Had to use today , felt she had more congestion than usual.   She denies any hemoptysis, chest pain, orthopnea, PND, or increased leg swelling  She has diastolic congestive heart failure and is on Lasix daily.  Past Medical History  Diagnosis Date  . Atrial fibrillation   . Hypertension   . Insomnia   . Nonischemic cardiomyopathy   . Hyperlipidemia   . Anxiety   . Cholelithiasis   . Insomnia   . Mitral regurgitation     noted 2010  . H/O epistaxis   . Rhinitis, allergic   . CHF (congestive heart failure)   . Pneumonia     "several times w/exacerbations of the COPD; nothing in the last year" (07/15/2012)  . COPD (chronic obstructive pulmonary disease)     as of 7/13 on 2-3L, pfts 10/2008 with mod obstruction  . Chronic bronchitis with COPD (chronic obstructive pulmonary disease)   . Shortness of breath     "all the time right now" (07/15/2012)  . GERD (gastroesophageal reflux disease)   . FUXNATFT(732.2)     "weekly" (07/15/2012)  . Migraines     "  weekly for awhile; cleared up as I got older" (07/15/2012)  . DJD (degenerative joint disease)   . Arthritis     "all over" (07/15/2012)  . OCD (obsessive compulsive disorder)   . OCD (obsessive compulsive disorder)   . Depression     h/o SI; "last time I was really serious about it was ~ 1997" (07/15/2012)       Review of Systems Constitutional:   No  weight loss, night sweats,  Fevers, chills, +fatigue, or  lassitude.  HEENT:   No headaches,  Difficulty swallowing,  Tooth/dental problems, or  Sore throat,                No sneezing, itching, ear ache,  +nasal congestion, post nasal drip,   CV:  No chest pain,  Orthopnea, PND,  anasarca, dizziness, palpitations, syncope.   GI  No heartburn, indigestion, abdominal pain, nausea, vomiting,  diarrhea, change in bowel habits, loss of appetite, bloody stools.   Resp:   No coughing up of blood.  No change in color of mucus.  No wheezing.  No chest wall deformity  Skin: no rash or lesions.  GU: no dysuria, change in color of urine, no urgency or frequency.  No flank pain, no hematuria   MS:  No joint pain or swelling.  No decreased range of motion.  No back pain.  Psych:  No change in mood or affect. No depression or anxiety.  No memory loss.        Objective:   Physical Exam GEN: A/Ox3; pleasant , NAD, elderly and overweight   HEENT:  [place]/AT,  EACs-clear, TMs-wnl, NOSE-clear, THROAT-clear, no lesions, no postnasal drip or exudate noted.   NECK:  Supple w/ fair ROM; no JVD; normal carotid impulses w/o bruits; no thyromegaly or nodules palpated; no lymphadenopathy.  RESP  Decreased BS in bases no accessory muscle use, no dullness to percussion  CARD:  RRR, no m/r/g  , no peripheral edema, pulses intact, no cyanosis or clubbing.  GI:   Soft & nt; nml bowel sounds; no organomegaly or masses detected.  Musco: Warm bil, no deformities or joint swelling noted.   Neuro: alert, no focal deficits noted.    Skin: Warm, no lesions or rashes         Assessment & Plan:

## 2013-12-20 NOTE — Assessment & Plan Note (Signed)
Compensated on present regimen  Continue on 2l/m

## 2013-12-20 NOTE — Patient Instructions (Signed)
Continue on Symbicort and Spiriva Rinse after all inhalers Continue to work on stopping smoking Continue on oxygen at 2 L Follow-up with [name] in 6 months and as needed

## 2013-12-20 NOTE — Assessment & Plan Note (Signed)
COPD   Plan  Continue on Symbicort and Spiriva Rinse after all inhalers Continue to work on stopping smoking Continue on oxygen at 2 L Follow-up with [name] in 6 months and as needed

## 2013-12-21 NOTE — Progress Notes (Signed)
Reviewed & agree with plan  

## 2013-12-25 ENCOUNTER — Encounter: Payer: Self-pay | Admitting: Internal Medicine

## 2013-12-25 DIAGNOSIS — R0902 Hypoxemia: Secondary | ICD-10-CM | POA: Insufficient documentation

## 2013-12-27 NOTE — Addendum Note (Signed)
Addended by: Dellia Nims on: 12/27/2013 11:09 PM   Modules accepted: Level of Service

## 2013-12-29 ENCOUNTER — Other Ambulatory Visit: Payer: Self-pay | Admitting: Internal Medicine

## 2013-12-29 ENCOUNTER — Telehealth: Payer: Self-pay | Admitting: *Deleted

## 2013-12-29 DIAGNOSIS — M542 Cervicalgia: Secondary | ICD-10-CM

## 2013-12-29 NOTE — Telephone Encounter (Signed)
Print out given to [facility] to process

## 2013-12-29 NOTE — Telephone Encounter (Signed)
Pt called today to report flexeril has not helped her neck pain.  She also had a reaction to the flexeril. She is asking for a referral, she talked with you about this at last clinic visit.    Pt # [phone]

## 2013-12-29 NOTE — Telephone Encounter (Signed)
I have put in for sports medicine referral for her neck pain. i am not going to give her any higher dose of pain med at this point.

## 2014-01-01 ENCOUNTER — Other Ambulatory Visit: Payer: Self-pay | Admitting: *Deleted

## 2014-01-03 ENCOUNTER — Telehealth: Payer: Self-pay | Admitting: *Deleted

## 2014-01-03 ENCOUNTER — Other Ambulatory Visit: Payer: Self-pay | Admitting: Internal Medicine

## 2014-01-03 DIAGNOSIS — J449 Chronic obstructive pulmonary disease, unspecified: Secondary | ICD-10-CM

## 2014-01-03 MED ORDER — POTASSIUM CHLORIDE CRYS ER 20 MEQ PO TBCR: 10.0000 meq | EXTENDED_RELEASE_TABLET | Freq: Two times a day (BID) | ORAL | Status: DC

## 2014-01-03 MED ORDER — ALBUTEROL SULFATE HFA 108 (90 BASE) MCG/ACT IN AERS
2.0000 | INHALATION_SPRAY | Freq: Four times a day (QID) | RESPIRATORY_TRACT | Status: DC | PRN
Start: 2014-01-03 — End: 2014-01-03

## 2014-01-03 MED ORDER — ALBUTEROL SULFATE HFA 108 (90 BASE) MCG/ACT IN AERS: 2.0000 | INHALATION_SPRAY | Freq: Four times a day (QID) | RESPIRATORY_TRACT | Status: DC | PRN

## 2014-01-03 MED ORDER — FUROSEMIDE 40 MG PO TABS: ORAL_TABLET | ORAL | Status: DC

## 2014-01-03 MED ORDER — PANTOPRAZOLE SODIUM 40 MG PO TBEC: DELAYED_RELEASE_TABLET | ORAL | Status: DC

## 2014-01-03 MED ORDER — FLUOXETINE HCL 20 MG PO CAPS: ORAL_CAPSULE | ORAL | Status: DC

## 2014-01-03 NOTE — Telephone Encounter (Signed)
done

## 2014-01-03 NOTE — Telephone Encounter (Signed)
Pharmacy states they need Rx for ProAir - insurance will not pay for Proventil. Needs new Rx. [facility] Hilda Blades Jaydeen Darley RN 01/03/14 2PM

## 2014-01-05 NOTE — Progress Notes (Signed)
INTERNAL MEDICINE TEACHING ATTENDING ADDENDUM - Kelsey Contes, MD: I personally saw and evaluated Mr. Kelsey Garcia in this clinic visit in conjunction with the resident, Dr. Genene Garcia. I have discussed patient's plan of care with medical resident during this visit. I have confirmed the physical exam findings and have read and agree with the clinic note including the plan with the following addition: - pt with neck pain likely MSK. Started on flexeril - Pt with OA pain not controlled with narco. Will consider referral to pain clinic on follow up - Encouraged smoking cessation

## 2014-01-08 ENCOUNTER — Telehealth: Payer: Self-pay | Admitting: Adult Health

## 2014-01-08 DIAGNOSIS — J449 Chronic obstructive pulmonary disease, unspecified: Secondary | ICD-10-CM

## 2014-01-08 NOTE — Telephone Encounter (Signed)
Pt is new to medicare so they require a new order for oxygen use. Per last OV note pt is to continue oxygen at 2 liters. Order placed stating this. Ashley Bing, CMA

## 2014-01-22 ENCOUNTER — Encounter: Payer: Self-pay | Admitting: Sports Medicine

## 2014-01-22 ENCOUNTER — Ambulatory Visit (INDEPENDENT_AMBULATORY_CARE_PROVIDER_SITE_OTHER): Payer: Medicare Other | Admitting: Sports Medicine

## 2014-01-22 VITALS — BP 102/68 | HR 74 | Ht 65.0 in | Wt 209.0 lb

## 2014-01-22 DIAGNOSIS — M542 Cervicalgia: Secondary | ICD-10-CM

## 2014-01-22 NOTE — Progress Notes (Signed)
   Subjective:    Patient ID: Kelsey Garcia, female    DOB: 23-May-1945, 68 y.o.   MRN: [id number]  HPI chief complaint: Neck pain  68 year old female comes in today complaining of 6 months of worsening right-sided neck pain. No trauma that she can recall. She describes a gradual onset of aching discomfort that begins along the right side of her neck and radiates up into the occiput as well as into the posterior aspect of her right shoulder. She denies any deep-seated shoulder pain. She denies any radiating pain past the shoulder. She denies any associated numbness or tingling. Denies similar issues in the past. She has been treated in the past for muscle spasms with Flexeril but did not tolerate this. No prior neck surgeries. Past medical history reviewed. Current medications reviewed. She is on a pain management contract through her PCPs office Allergies reviewed Social history reviewed    Review of Systems     Objective:   Physical Exam Well-developed, well-nourished. No acute distress. Awake alert and oriented 3. Vital signs reviewed.  Cervical spine: Limited cervical rotation to the right by about 50%. There is palpable crepitus along the facets with cervical rotation as well as with flexion and extension. Flexion, extension, and cervical rotation to the left are full. No cervical midline tenderness. No appreciable spasm. Neurovascularly intact distally.       Assessment & Plan:  Neck pain likely secondary to cervical facet arthropathy and concomitant cervical degenerative disc disease  X-rays of the cervical spine. Physical therapy at our [place] location. We discussed the possibility of more aggressive treatment in the form of cervical facet injections if her symptoms warranted but she is currently not interested in this. Continue with pain management per her PCPs discretion. Follow-up with me when necessary.

## 2014-01-31 ENCOUNTER — Other Ambulatory Visit: Payer: Self-pay | Admitting: Sports Medicine

## 2014-01-31 ENCOUNTER — Ambulatory Visit
Admission: RE | Admit: 2014-01-31 | Discharge: 2014-01-31 | Disposition: A | Discharge status: Home / Self Care | Payer: Medicare Other | Source: Ambulatory Visit | Attending: Sports Medicine | Admitting: Sports Medicine

## 2014-01-31 DIAGNOSIS — M542 Cervicalgia: Secondary | ICD-10-CM

## 2014-02-05 ENCOUNTER — Telehealth: Payer: Self-pay | Admitting: Sports Medicine

## 2014-02-05 NOTE — Telephone Encounter (Signed)
I spoke with the patient on the phone today after reviewing the x-rays of her cervical spine. She has multilevel degenerative changes including bilateral facet arthropathy at C3-C4, C4-C5, C5-C6, and C6-C7. Clinically I think her facet arthropathy is her main pain generator. I reassured her that these are normal degenerative changes. We discussed trying some physical therapy at her last office visit and she reassures me that she will schedule that sometime in the near future. We also discussed the possibility of facet injections but her pain does not warrant that at this time. Follow-up when necessary.

## 2014-02-06 ENCOUNTER — Other Ambulatory Visit: Payer: Self-pay | Admitting: *Deleted

## 2014-02-06 DIAGNOSIS — R05 Cough: Secondary | ICD-10-CM

## 2014-02-06 DIAGNOSIS — M1610 Unilateral primary osteoarthritis, unspecified hip: Secondary | ICD-10-CM

## 2014-02-06 DIAGNOSIS — R053 Chronic cough: Secondary | ICD-10-CM

## 2014-02-06 NOTE — Telephone Encounter (Signed)
Call from patient stating she picked up her last Norco rx on 02/01/14 and will be picking up her last Hycodan syrup around 02/17/2014.  Pt has f/u appt with pcp on 04/16/2014 and is calling ahead of time to get her rx's ready for pick up when she needs them.  Pt informed that MD will be unavailable until next week.  Since rxs are not due at this time, she is ok with MD addressing refills when he returns.  Requesting rxs for Jan and Feb.  Will send to pcp for review, please advise.Kelsey Hidden Cassady12/29/20151:40 PM

## 2014-02-12 ENCOUNTER — Other Ambulatory Visit: Payer: Self-pay | Admitting: Internal Medicine

## 2014-02-12 DIAGNOSIS — M1610 Unilateral primary osteoarthritis, unspecified hip: Secondary | ICD-10-CM

## 2014-02-12 DIAGNOSIS — R053 Chronic cough: Secondary | ICD-10-CM

## 2014-02-12 DIAGNOSIS — R05 Cough: Secondary | ICD-10-CM

## 2014-02-12 MED ORDER — HYDROCODONE-HOMATROPINE 5-1.5 MG/5ML PO SYRP: 5.0000 mL | ORAL_SOLUTION | Freq: Four times a day (QID) | ORAL | Status: DC | PRN

## 2014-02-12 MED ORDER — HYDROCODONE-ACETAMINOPHEN 7.5-325 MG PO TABS: 1.0000 | ORAL_TABLET | Freq: Four times a day (QID) | ORAL | Status: DC | PRN

## 2014-02-12 NOTE — Telephone Encounter (Signed)
Call from pt checking on refill status.[name]1/4/201612:25 PM

## 2014-02-12 NOTE — Telephone Encounter (Signed)
Done

## 2014-02-12 NOTE — Progress Notes (Signed)
Wrote script for 2 months of hycodan and norco on 02/12/2014.  she picked up her last Norco rx on 02/01/14 and will be picking up her last Hycodan syrup around 02/17/2014.

## 2014-02-13 ENCOUNTER — Ambulatory Visit: Payer: Medicare Other | Attending: Sports Medicine | Admitting: Physical Therapy

## 2014-02-13 DIAGNOSIS — M5030 Other cervical disc degeneration, unspecified cervical region: Secondary | ICD-10-CM | POA: Insufficient documentation

## 2014-02-13 DIAGNOSIS — M1388 Other specified arthritis, other site: Secondary | ICD-10-CM | POA: Diagnosis not present

## 2014-02-13 DIAGNOSIS — M542 Cervicalgia: Secondary | ICD-10-CM | POA: Diagnosis not present

## 2014-02-15 ENCOUNTER — Ambulatory Visit: Payer: Medicare Other | Admitting: Physical Therapy

## 2014-02-15 DIAGNOSIS — M542 Cervicalgia: Secondary | ICD-10-CM | POA: Diagnosis not present

## 2014-02-20 ENCOUNTER — Ambulatory Visit: Payer: Medicare Other | Admitting: Physical Therapy

## 2014-02-20 DIAGNOSIS — M542 Cervicalgia: Secondary | ICD-10-CM | POA: Diagnosis not present

## 2014-02-22 ENCOUNTER — Ambulatory Visit: Payer: Medicare Other | Admitting: Physical Therapy

## 2014-02-22 DIAGNOSIS — M542 Cervicalgia: Secondary | ICD-10-CM | POA: Diagnosis not present

## 2014-02-27 ENCOUNTER — Ambulatory Visit: Payer: Medicare Other | Admitting: Physical Therapy

## 2014-02-28 ENCOUNTER — Telehealth: Payer: Self-pay | Admitting: *Deleted

## 2014-02-28 NOTE — Telephone Encounter (Signed)
Pharmacy calls and states that pt would like fill of med early due to weather, called pharmacy and told them per dr Genene Churn may fill 5 days early this one time due to inclement weather and to stress this time only due to weather, pharmacist was very agreeable

## 2014-03-01 ENCOUNTER — Encounter: Payer: Self-pay | Admitting: Cardiovascular Disease

## 2014-03-01 ENCOUNTER — Ambulatory Visit (INDEPENDENT_AMBULATORY_CARE_PROVIDER_SITE_OTHER): Payer: Medicare Other | Admitting: Cardiovascular Disease

## 2014-03-01 ENCOUNTER — Ambulatory Visit: Payer: Medicare Other | Admitting: Physical Therapy

## 2014-03-01 VITALS — BP 116/62 | HR 70 | Ht 65.0 in | Wt 201.0 lb

## 2014-03-01 DIAGNOSIS — I482 Chronic atrial fibrillation, unspecified: Secondary | ICD-10-CM

## 2014-03-01 DIAGNOSIS — I10 Essential (primary) hypertension: Secondary | ICD-10-CM

## 2014-03-01 DIAGNOSIS — R0600 Dyspnea, unspecified: Secondary | ICD-10-CM

## 2014-03-01 DIAGNOSIS — J431 Panlobular emphysema: Secondary | ICD-10-CM | POA: Diagnosis not present

## 2014-03-01 LAB — BASIC METABOLIC PANEL
BUN: 10 mg/dL (ref 6–23)
CALCIUM: 9.1 mg/dL (ref 8.4–10.5)
CO2: 29 mEq/L (ref 19–32)
Chloride: 96 mEq/L (ref 96–112)
Creatinine, Ser: 1.2 mg/dL (ref 0.40–1.20)
GFR: 47.6 mL/min — ABNORMAL LOW (ref >60.00)
GLUCOSE: 106 mg/dL — AB (ref 70–99)
Potassium: 3.2 mEq/L — ABNORMAL LOW (ref 3.5–5.1)
SODIUM: 137 meq/L (ref 135–145)

## 2014-03-01 LAB — CBC WITH DIFFERENTIAL/PLATELET
BASOS ABS: 0 10*3/uL (ref 0.0–0.1)
Basophils Relative: 0.3 % (ref 0.0–3.0)
Eosinophils Absolute: 0.1 10*3/uL (ref 0.0–0.7)
Eosinophils Relative: 1.1 % (ref 0.0–5.0)
HEMATOCRIT: 39 % (ref 36.0–46.0)
Hemoglobin: 13.2 g/dL (ref 12.0–15.0)
LYMPHS ABS: 2.6 10*3/uL (ref 0.7–4.0)
Lymphocytes Relative: 26.8 % (ref 12.0–46.0)
MCHC: 34 g/dL (ref 30.0–36.0)
MCV: 92.6 fl (ref 78.0–100.0)
Monocytes Absolute: 0.9 10*3/uL (ref 0.1–1.0)
Monocytes Relative: 8.8 % (ref 3.0–12.0)
NEUTROS ABS: 6.1 10*3/uL (ref 1.4–7.7)
NEUTROS PCT: 63 % (ref 43.0–77.0)
Platelets: 330 10*3/uL (ref 150.0–400.0)
RBC: 4.21 Mil/uL (ref 3.87–5.11)
RDW: 14.5 % (ref 11.5–15.5)
WBC: 9.7 10*3/uL (ref 4.0–10.5)

## 2014-03-01 LAB — HEPATIC FUNCTION PANEL
ALBUMIN: 3.4 g/dL — AB (ref 3.5–5.2)
ALT: 26 U/L (ref 0–35)
AST: 33 U/L (ref 0–37)
Alkaline Phosphatase: 122 U/L — ABNORMAL HIGH (ref 39–117)
BILIRUBIN DIRECT: 0.4 mg/dL — AB (ref 0.0–0.3)
BILIRUBIN TOTAL: 0.9 mg/dL (ref 0.2–1.2)
Total Protein: 7.1 g/dL (ref 6.0–8.3)

## 2014-03-01 LAB — TSH: TSH: 1.5 u[IU]/mL (ref 0.35–4.50)

## 2014-03-01 NOTE — Progress Notes (Signed)
Patient ID: Kelsey Garcia, female   DOB: 1945-04-16, 69 y.o.   MRN: [id number]   Kelsey Garcia is a 69 y.o.  female who returns for follow up on CHF,   She is prior patient of Dr. Doreatha Lew  Hospitalized in  09/2010 for AFib. She has a hx of permanent AFib, s/p 2 failed DCCVs, diastolic CHF, COPD, HTN, HL, tobacco abuse, OCD. Echo in 2010 with normal LVF. Anticoagulation recommended  but patient declined (CHADS2-VASc=4). Last echo (07/2012): Normal LV wall thickness and motion, EF 60-65%, mild RAE. She was recently seen in follow up with her PCP and noted to have worsening LE edema. Lasix was increased with improvement according to the notes in her chart.  Patient notes her LE edema is improved since taking Lasix 80 bid. She has been trying to lose weight over the last 1 year (lost 30lbs). Also exercising at home. Denies exertional CP. Notes DOE that is chronic. Seems to be worse. She is NYHA Class IIb. No orthopnea or PND. No syncope.  On home oxygen for 4 years  Seeing Dr [name] next month.  Had pneumonia shot but routinely avoids flu shot  Labs (6/14): BNP 1190, TSH 1.098  Labs (8/14): K 3.5, Cr 1.06, HDL 43, LDL 117, Hgb 13.8  Echo 6/14  Reviewed Study Conclusions  - Left ventricle: The cavity size was normal. Wall thickness was normal. Systolic function was normal. The estimated ejection fraction was in the range of 60% to 65%. Wall motion was normal; there were no regional wall motion abnormalities. - Right atrium: The atrium was mildly dilated.  Feeling more fatigued Some nausea and stomach upset  She thinks her breathing is ok but wearing 2 Liters continuously  ROS: Denies fever, malais, weight loss, blurry vision, decreased visual acuity, cough, sputum, SOB, hemoptysis, pleuritic pain, palpitaitons, heartburn, abdominal pain, melena, lower extremity edema, claudication, or rash.  All other systems reviewed and negative  General: Affect appropriate Chronically ill white female   HEENT: normal Neck supple with no adenopathy JVP normal no bruits no thyromegaly Lungs expitory wheezing and good diaphragmatic motion Heart:  S1/S2 no murmur, no rub, gallop or click PMI normal Abdomen: benighn, BS positve, no tenderness, no AAA no bruit.  No HSM or HJR Distal pulses intact with no bruits No edema Neuro non-focal Skin warm and dry No muscular weakness   Current Outpatient Prescriptions  Medication Sig Dispense Refill  . albuterol (PROAIR HFA) 108 (90 BASE) MCG/ACT inhaler Inhale 2 puffs into the lungs every 6 (six) hours as needed for wheezing or shortness of breath. 18 g 5  . aspirin 325 MG tablet Take 650 mg by mouth daily.     . budesonide-formoterol (SYMBICORT) 160-4.5 MCG/ACT inhaler Inhale 2 puffs into the lungs 2 (two) times daily. 10.2 g 11  . busPIRone (BUSPAR) 5 MG tablet Take 40 mg by mouth 3 (three) times daily.    . chlorpheniramine (CHLOR-TRIMETON) 4 MG tablet Take 1 tablet (4 mg total) by mouth every 8 (eight) hours as needed for allergies. 90 tablet 0  . diazepam (VALIUM) 2 MG tablet Take 1 tablet (2 mg total) by mouth every 12 (twelve) hours as needed for anxiety. 60 tablet 3  . diltiazem (TIAZAC) 420 MG 24 hr capsule Take 1 capsule (420 mg total) by mouth daily. 90 capsule 3  . FLUoxetine (PROZAC) 20 MG capsule TAKE THREE CAPSULES BY MOUTH ONCE DAILY (Patient taking differently: Take 60 mg by mouth. TAKE THREE CAPSULES BY MOUTH  ONCE DAILY) 90 capsule 5  . furosemide (LASIX) 40 MG tablet TAKE TWO TABLETS BY MOUTH TWICE DAILY 120 tablet 5  . HYDROcodone-acetaminophen (NORCO) 7.5-325 MG per tablet Take 1 tablet by mouth every 6 (six) hours as needed. 120 tablet 0  . HYDROcodone-homatropine (HYCODAN) 5-1.5 MG/5ML syrup Take 5 mLs by mouth every 6 (six) hours as needed for cough. 120 mL 0  . nystatin (MYCOSTATIN) 100000 UNIT/ML suspension Take 500,000 Units by mouth as needed.    . pantoprazole (PROTONIX) 40 MG tablet TAKE ONE TABLET BY MOUTH TWICE DAILY  60 tablet 5  . potassium chloride SA (K-DUR,KLOR-CON) 20 MEQ tablet Take 0.5 tablets (10 mEq total) by mouth 2 (two) times daily. 30 tablet 6  . tiotropium (SPIRIVA) 18 MCG inhalation capsule Place 1 capsule (18 mcg total) into inhaler and inhale daily. 90 capsule 4  . zolpidem (AMBIEN) 5 MG tablet Take 1 tablet (5 mg total) by mouth at bedtime as needed for sleep. 30 tablet 3   No current facility-administered medications for this visit.    Allergies  Ace inhibitors; Diphenhydramine hcl; Erythromycin; Nsaids; Nyquil; Tramadol hcl; Flexeril; and Pseudoephedrine  Electrocardiogram:  afib nonspecific ST/T wave changes  03/01/13  1/21`/16  Afibr rate 70  Poor R wave progression no significant change   Assessment and Plan

## 2014-03-01 NOTE — Assessment & Plan Note (Signed)
I suspect her constitutional symptoms are from her COPD  F/U pulmonary refused flue shot  Continue oxygen  Labs today to assess TSH, and lytes

## 2014-03-01 NOTE — Assessment & Plan Note (Signed)
Good rate control Refuses anticoagulation.  Given malaise and more constitutional symptoms will order echo to reassess EF

## 2014-03-01 NOTE — Patient Instructions (Signed)
Your physician wants you to follow-up in: [place] will receive a reminder letter in the mail two months in advance. If you don't receive a letter, please call our office to schedule the follow-up appointment. Your physician recommends that you continue on your current medications as directed. Please refer to the Current Medication list given to you today.  Your physician recommends that you return for lab work in:  BMET  [place]  TSH  Your physician has requested that you have an echocardiogram. Echocardiography is a painless test that uses sound waves to create images of your heart. It provides your doctor with information about the size and shape of your heart and how well your heart's chambers and valves are working. This procedure takes approximately one hour. There are no restrictions for this procedure.

## 2014-03-01 NOTE — Assessment & Plan Note (Signed)
Well controlled.  Continue current medications and low sodium Dash type diet.    

## 2014-03-06 ENCOUNTER — Other Ambulatory Visit: Payer: Self-pay | Admitting: *Deleted

## 2014-03-06 ENCOUNTER — Ambulatory Visit: Payer: Medicare Other | Admitting: Physical Therapy

## 2014-03-06 DIAGNOSIS — E876 Hypokalemia: Secondary | ICD-10-CM

## 2014-03-07 ENCOUNTER — Ambulatory Visit (HOSPITAL_COMMUNITY): Payer: Medicare Other | Attending: Cardiovascular Disease

## 2014-03-07 DIAGNOSIS — R0600 Dyspnea, unspecified: Secondary | ICD-10-CM | POA: Diagnosis present

## 2014-03-07 NOTE — Progress Notes (Signed)
2D Echo completed. 03/07/2014

## 2014-03-08 ENCOUNTER — Ambulatory Visit: Payer: Medicare Other | Admitting: Physical Therapy

## 2014-03-09 ENCOUNTER — Other Ambulatory Visit: Payer: Self-pay | Admitting: *Deleted

## 2014-03-09 MED ORDER — ZOLPIDEM TARTRATE 5 MG PO TABS: 5.0000 mg | ORAL_TABLET | Freq: Every evening | ORAL | Status: DC | PRN

## 2014-03-12 NOTE — Telephone Encounter (Signed)
Called to pharmacy 

## 2014-04-16 ENCOUNTER — Ambulatory Visit (INDEPENDENT_AMBULATORY_CARE_PROVIDER_SITE_OTHER): Payer: Medicare Other | Admitting: Internal Medicine

## 2014-04-16 ENCOUNTER — Encounter: Payer: Self-pay | Admitting: Internal Medicine

## 2014-04-16 VITALS — BP 123/69 | HR 75 | Temp 98.1°F | Wt 195.8 lb

## 2014-04-16 DIAGNOSIS — M1610 Unilateral primary osteoarthritis, unspecified hip: Secondary | ICD-10-CM | POA: Diagnosis not present

## 2014-04-16 DIAGNOSIS — R05 Cough: Secondary | ICD-10-CM

## 2014-04-16 DIAGNOSIS — R053 Chronic cough: Secondary | ICD-10-CM

## 2014-04-16 DIAGNOSIS — Z23 Encounter for immunization: Secondary | ICD-10-CM | POA: Diagnosis not present

## 2014-04-16 DIAGNOSIS — M542 Cervicalgia: Secondary | ICD-10-CM | POA: Diagnosis not present

## 2014-04-16 DIAGNOSIS — B370 Candidal stomatitis: Secondary | ICD-10-CM

## 2014-04-16 MED ORDER — NYSTATIN 100000 UNIT/ML MT SUSP: 500000.0000 [IU] | OROMUCOSAL | Status: DC | PRN

## 2014-04-16 MED ORDER — HYDROCODONE-ACETAMINOPHEN 7.5-325 MG PO TABS: 1.0000 | ORAL_TABLET | Freq: Four times a day (QID) | ORAL | Status: DC | PRN

## 2014-04-16 MED ORDER — HYDROCODONE-HOMATROPINE 5-1.5 MG/5ML PO SYRP: 5.0000 mL | ORAL_SOLUTION | Freq: Four times a day (QID) | ORAL | Status: DC | PRN

## 2014-04-16 NOTE — Progress Notes (Signed)
   Subjective:    Patient ID: Kelsey Garcia, female    DOB: 03-07-1945, 69 y.o.   MRN: [id number]  HPI   69 yo female with afib (refused anticoag), chronic cough, copd, tobacco abuse, diastolic CHF, depression, anxiety, and here for follow up.    Saw sports medicine Dr. Micheline Chapman on 02/05/14 for neck pain, imaging showed multilevel degenerative changes including bilateral facet arthropathy at C3-C4, C4-C5, C5-C6, and C6-C7. Started physical therapy with improvement of ROM on neck but continues to have pain.  Also has chronic b/l knee pain and arm pain. Is on norco 7-325mg  for that. Wants to go up on the dose. But she is actually functioning better with this current regimen. Her mood has improved too with physical therapy.  Patient refused to discuss smoking with me as she said that's between herself and her behav therapist.   Has some tenderness under left breast for few weeks.  No rashes. No SOB or cough more than her usual COPD. No injuries.  Patient started yelling at Dr. Lynnae January when it was stated that since she is physically functioning better, she should work with behav health provider to better manage her mood disorder.   Review of Systems  Constitutional: Negative for fever and chills.  HENT: Negative.   Eyes: Negative.   Respiratory: Negative for cough, chest tightness and shortness of breath.   Cardiovascular: Negative for chest pain and palpitations.  Gastrointestinal: Negative.   Endocrine: Negative.   Genitourinary: Negative.   Musculoskeletal: Positive for arthralgias and neck pain.  Skin: Negative.   Allergic/Immunologic: Negative.   Neurological: Negative.   Psychiatric/Behavioral: The patient is nervous/anxious.        Objective:   Physical Exam  Constitutional: She is oriented to person, place, and time. She appears well-developed and well-nourished. No distress.  HENT:  Right Ear: External ear normal.  Left Ear: External ear normal.  Eyes: EOM are normal.  Pupils are equal, round, and reactive to light.  Neck: Normal range of motion.  Has subjective pain on left neck radiating down to both arms. No TTP. Strength 5/5 on both arms. Sensation intact  Cardiovascular: Normal rate and regular rhythm.  Exam reveals no gallop and no friction rub.   No murmur heard. Pulmonary/Chest: Effort normal and breath sounds normal. No respiratory distress. She has no wheezes. She exhibits no tenderness.  Abdominal: Soft. She exhibits no distension. There is no guarding.  Musculoskeletal: Normal range of motion. She exhibits no edema or tenderness.  Has chronic b/l knee pain. No effusion or TTP on knees.  Neurological: She is alert and oriented to person, place, and time.  Skin: She is not diaphoretic.        Assessment & Plan:  See problem based a&p.

## 2014-04-16 NOTE — Patient Instructions (Signed)
Continue to work with your therapist (physical and behavioral). Please follow up with sports medicine for your neck pain.  Take nystatin for your mouth thrush.  See [place] as needed.

## 2014-04-17 NOTE — Assessment & Plan Note (Addendum)
Has oral thrush that's easily scrapped off. Likely fungal.  Will treat with nystatin suspension. Likely from her inhalers.

## 2014-04-17 NOTE — Progress Notes (Signed)
Internal Medicine Clinic Attending  I saw and evaluated the patient.  I personally confirmed the key portions of the history and exam documented by Dr. Genene Churn and I reviewed pertinent patient test results.  The assessment, diagnosis, and plan were formulated together and I agree with the documentation in the resident's note. Dr Genene Churn appropriately rec not increasing Kelsey Garcia's opioid dosing after his H&P. The pt requested to speak with his supervisor. The pt related to me that it was her R neck / shoulder that was the reason for her request to increase her pain meds. She stated that with PT, her function had improved quite a bit but was not yet at baseline. She has better ROM in that area. She stated that her pain was occ wakening her up at night but that more importantly, it was causing her irritability, depression, not wanting to be around people, and not wanting to go to the races and to ball games. Since her physical function has improved, I completely agree with Dr Genene Churn, that an increase in her opioid dosage is not appropriate. I explained to the pt that since her sxs were primarily mood in nature, we needed to work with her mental health providers to provide better sx directed care. At this point, Kelsey Garcia became abusive, raising her voice, cussing, and not hearing my explanation of tx rec. I asked whether she was interested in having a conversation or cont her yelling and she answer with more cuss words. I told her I would return when she was ready to have a conversation and left the room.    Of note, while I was reviewing Kelsey Garcia's chart to see if she had any other documented inappropriate behaviour, I read the dismissal letter from PT. Kelsey Garcia requested in Feb to be released from PT and a 50% improvement in her R shoulder/neck pain had been documented. Kelsey Garcia led me to believe that she was cont to receive PT.

## 2014-04-17 NOTE — Assessment & Plan Note (Signed)
multilevel degenerative changes including bilateral facet arthropathy at C3-C4, C4-C5, C5-C6, and C6-C7. Seen by sports medicine recently.   Continuing PT therapy. Feels ROM is better with it.

## 2014-04-17 NOTE — Assessment & Plan Note (Signed)
Refilled norco for 1 month on 04/16/2014.   Pain is chronic on both knees and hands. Pt wants to go up on the dose even though her functionality is better with the current regimen. We discussed that we don't feel it's necessary to go up with her in the past. But she continues to disagree with [place] to the point that she started yelling at the attending.   She should not get any higher dose of pain med. She should rather continue to work with PT and behav health for her mood disorder.

## 2014-04-17 NOTE — Assessment & Plan Note (Signed)
Continues to smoke. Refuses to talk about it with me stating "that's between my therapist and me".  Refilled hycodan cough syrup.

## 2014-04-18 ENCOUNTER — Encounter: Payer: Self-pay | Admitting: Physical Therapy

## 2014-04-18 ENCOUNTER — Ambulatory Visit: Payer: Medicare Other | Attending: Sports Medicine | Admitting: Physical Therapy

## 2014-04-18 DIAGNOSIS — M542 Cervicalgia: Secondary | ICD-10-CM | POA: Insufficient documentation

## 2014-04-18 DIAGNOSIS — M5030 Other cervical disc degeneration, unspecified cervical region: Secondary | ICD-10-CM

## 2014-04-18 DIAGNOSIS — M629 Disorder of muscle, unspecified: Secondary | ICD-10-CM

## 2014-04-18 DIAGNOSIS — M1388 Other specified arthritis, other site: Secondary | ICD-10-CM | POA: Insufficient documentation

## 2014-04-18 DIAGNOSIS — M5382 Other specified dorsopathies, cervical region: Secondary | ICD-10-CM

## 2014-04-18 NOTE — Therapy (Signed)
[facility] [address] [place], [place], [address] Phone: [phone]   Fax:  [phone]  Physical Therapy Evaluation  Patient Details  Name: Kelsey Garcia MRN: [id number] Date of Birth: 02/08/1946 Referring Provider:  Thurman Coyer, DO  Encounter Date: 04/18/2014      PT End of Session - 04/18/14 1422    Visit Number 1   Number of Visits 16   Date for PT Re-Evaluation 06/18/14   PT Start Time 1350   PT Stop Time 6073   PT Time Calculation (min) 55 min      Past Medical History  Diagnosis Date  . Atrial fibrillation   . Hypertension   . Insomnia   . Nonischemic cardiomyopathy   . Hyperlipidemia   . Anxiety   . Cholelithiasis   . Insomnia   . Mitral regurgitation     noted 2010  . H/O epistaxis   . Rhinitis, allergic   . CHF (congestive heart failure)   . Pneumonia     "several times w/exacerbations of the COPD; nothing in the last year" (07/15/2012)  . COPD (chronic obstructive pulmonary disease)     as of 7/13 on 2-3L, pfts 10/2008 with mod obstruction  . Chronic bronchitis with COPD (chronic obstructive pulmonary disease)   . Shortness of breath     "all the time right now" (07/15/2012)  . GERD (gastroesophageal reflux disease)   . XTGGYIRS(854.6)     "weekly" (07/15/2012)  . Migraines     "weekly for awhile; cleared up as I got older" (07/15/2012)  . DJD (degenerative joint disease)   . Arthritis     "all over" (07/15/2012)  . OCD (obsessive compulsive disorder)   . OCD (obsessive compulsive disorder)   . Depression     h/o SI; "last time I was really serious about it was ~ 1997" (07/15/2012)    Past Surgical History  Procedure Laterality Date  . Tubal ligation  1972  . Cardioversion  2003; 07/2003    There were no vitals taken for this visit.  Visit Diagnosis:  DDD (degenerative disc disease), cervical - Plan: PT plan of care cert/re-cert  Cervical pain - Plan: PT plan of care  cert/re-cert  Musculoskeletal disorder involving upper trapezius muscle - Plan: PT plan of care cert/re-cert      Subjective Assessment - 04/18/14 1400    Symptoms Neck pain for a number of years, x-rays show DDD, she is on 2 liters of oxygen, she does some of her own housework   Pertinent History HTN, asthma, A-fib, CHF, OP   How long can you sit comfortably? 1 hour   How long can you stand comfortably? 5 minutes   How long can you walk comfortably? 3 minutes   Diagnostic tests x-rays DDD, arthritis   Patient Stated Goals "have less pain and spasms"   Currently in Pain? Yes   Pain Score 7    Pain Location Neck   Pain Orientation Right   Pain Descriptors / Indicators Aching;Cramping;Spasm;Sharp   Pain Radiating Towards some pain into the scapula and into the right arm   Pain Onset More than a month ago   Pain Frequency Constant   Aggravating Factors  lifitng and any real activity will increase the pain to 10/10   Pain Relieving Factors heat and pain med, treatment here helped in the past, at best the pain can get down to a 2/10   Effect of Pain on  Daily Activities limited with everything          [facility] PT Assessment - 04/18/14 0001    Assessment   Medical Diagnosis cervical pain and DDD   Onset Date 01/22/14   Prior Therapy yes 4 visits here in January prior to hospital admit for heart issue   Precautions   Precautions --  2 liters O2   Balance Screen   Has the patient fallen in the past 6 months No   Has the patient had a decrease in activity level because of a fear of falling?  No   Is the patient reluctant to leave their home because of a fear of falling?  No   Home Environment   Living Enviornment Private residence   Home Access Stairs to enter   Additional Comments lives with daughter and grand daughter   Prior Function   Level of Independence Requires assistive device for independence   Vocation Unemployed   Leisure none   Posture/Postural Control   Posture  Comments fwd head, rounded shoulders    ROM / Strength   AROM / PROM / Strength --  Strength of the shoulders 4-/5   AROM   Overall AROM Comments cervical ROM is decreased 50% for all motions, except extension was decreased 75%   Palpation   Palpation significant spasms and tenderness in the right upper trap and into the cervical area and the rhomboid   Special Tests    Special Tests --  we tried traction in January and she reported increased pain                  [facility] Adult PT Treatment/Exercise - 04/18/14 0001    Modalities   Modalities Electrical Stimulation;Moist Heat   Moist Heat Therapy   Number Minutes Moist Heat 15 Minutes   Moist Heat Location Other (comment)  right upper trap   Electrical Stimulation   Electrical Stimulation Location right upper trap   Electrical Stimulation Parameters IFC   Electrical Stimulation Goals Pain                  PT Short Term Goals - 04/18/14 1426    PT SHORT TERM GOAL #1   Title independent with initial HEP   Time 2   Period Weeks   Status New           PT Long Term Goals - 04/18/14 1426    PT LONG TERM GOAL #1   Title independent with proper posture and body mechanics   Time 8   Period Weeks   Status New   PT LONG TERM GOAL #2   Title increase cervical ROM 25%   Time 8   Period Weeks   Status New   PT LONG TERM GOAL #3   Title decrease pain 25%   Time 8   Period Weeks   Status New   PT LONG TERM GOAL #4   Title tolerate activity for 15 minutes   Time 8   Period Weeks   Status New               Plan - 04/18/14 1423    Clinical Impression Statement Patient was seen here for a few visits in January, she reports that PT treatment was helping, but had a heart issue and had to stop.  She has significant spasms and tenderness in the upper trap, decreased cervical ROM,,,,,,,,,,,,   Pt will benefit from skilled therapeutic intervention in order to improve  on the following deficits Decreased  range of motion;Increased muscle spasms;Impaired flexibility;Pain;Postural dysfunction;Improper body mechanics   Rehab Potential Fair   PT Frequency 2x / week   PT Duration 8 weeks   PT Treatment/Interventions Electrical Stimulation;Moist Heat;Therapeutic activities;Therapeutic exercise;Patient/family education;Passive range of motion;Manual techniques;Ultrasound;Traction   PT Next Visit Plan may add some exercises slowly, try myofascial release   Consulted and Agree with Plan of Care Patient          G-Codes - 2014-05-17 1428    Functional Assessment Tool Used FOTO   Functional Limitation Self care   Self Care Current Status [phone]) At least 40 percent but less than 60 percent impaired, limited or restricted   Self Care Goal Status (N8295) At least 60 percent but less than 80 percent impaired, limited or restricted       Problem List Patient Active Problem List   Diagnosis Date Noted  . Oxygen desaturation 12/25/2013  . Respiratory failure, chronic 12/20/2013  . Neck pain on right side 12/18/2013  . Chronic diastolic CHF (congestive heart failure) 10/12/2013  . Osteoporosis 11/29/2012  . Easy bruising 10/01/2012  . Oral thrush 09/24/2012  . Other screening mammogram 12/25/2011  . Screening for colon cancer 12/25/2011  . Impaired mobility 11/26/2011  . 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 Long term current use of opiate analgesic 10/30/2011  . GERD (gastroesophageal reflux disease) 10/30/2011  . Chronic cough 08/28/2011  . Elevated MCV 08/28/2011  . Financial difficulties 04/22/2011  . Preventative health care 10/30/2010  . Anxiety 10/30/2010  . HYPERLIPIDEMIA 05/24/2009  . NICOTINE ADDICTION 01/07/2009  . Osteoarthritis 11/28/2008  . COPD (chronic obstructive pulmonary disease) 03/28/2008  . OBSESSIVE-COMPULSIVE DISORDER 12/29/2005  . DEPRESSION 12/29/2005  . Essential hypertension 12/29/2005  . CARDIOMYOPATHY 12/29/2005  . Atrial fibrillation 12/29/2005  .  CHOLELITHIASIS 12/29/2005  . INSOMNIA 12/29/2005    Sumner Boast, PT 05/17/14, 2:30 PM  [address] [place] [place], [place], [address] Phone: [phone]   Fax:  [phone]

## 2014-04-26 ENCOUNTER — Encounter: Payer: Self-pay | Admitting: Physical Therapy

## 2014-04-26 ENCOUNTER — Ambulatory Visit: Payer: Medicare Other | Admitting: Physical Therapy

## 2014-04-26 DIAGNOSIS — M542 Cervicalgia: Secondary | ICD-10-CM

## 2014-04-26 NOTE — Therapy (Signed)
[address] [place], [place], [address] Phone: [phone]   Fax:  [phone]  Physical Therapy Treatment  Patient Details  Name: Kelsey Garcia MRN: [id number] Date of Birth: [birth year] Referring Provider:  Thurman Coyer, DO  Encounter Date: 04/26/2014      PT End of Session - 04/26/14 1253    Visit Number 2   PT Start Time 1230   PT Stop Time 1310   PT Time Calculation (min) 40 min      Past Medical History  Diagnosis Date  . Atrial fibrillation   . Hypertension   . Insomnia   . Nonischemic cardiomyopathy   . Hyperlipidemia   . Anxiety   . Cholelithiasis   . Insomnia   . Mitral regurgitation     noted 2010  . H/O epistaxis   . Rhinitis, allergic   . CHF (congestive heart failure)   . Pneumonia     "several times w/exacerbations of the COPD; nothing in the last year" (07/15/2012)  . COPD (chronic obstructive pulmonary disease)     as of 7/13 on 2-3L, pfts 10/2008 with mod obstruction  . Chronic bronchitis with COPD (chronic obstructive pulmonary disease)   . Shortness of breath     "all the time right now" (07/15/2012)  . GERD (gastroesophageal reflux disease)   . DEYCXKGY(185.6)     "weekly" (07/15/2012)  . Migraines     "weekly for awhile; cleared up as I got older" (07/15/2012)  . DJD (degenerative joint disease)   . Arthritis     "all over" (07/15/2012)  . OCD (obsessive compulsive disorder)   . OCD (obsessive compulsive disorder)   . Depression     h/o SI; "last time I was really serious about it was ~ 1997" (07/15/2012)    Past Surgical History  Procedure Laterality Date  . Tubal ligation  1972  . Cardioversion  2003; 07/2003    There were no vitals filed for this visit.  Visit Diagnosis:  Cervical pain      Subjective Assessment - 04/26/14 1249    Symptoms I am on so much medsI am high and still hurting   Pain Score 10-Worst pain ever   Pain Location Neck                        [facility] Adult PT Treatment/Exercise - 04/26/14 0001    Posture/Postural Control   Posture Comments shruggs aand backward rolls 3 sets 5, cervical AROM 5 times each   Modalities   Modalities Electrical Stimulation;Moist Heat   Moist Heat Therapy   Number Minutes Moist Heat 15 Minutes   Moist Heat Location --  trap/rhom   Electrical Stimulation   Electrical Stimulation Location --  bilateral trap/rhom   Electrical Stimulation Parameters IFC   Electrical Stimulation Goals Pain   Manual Therapy   Manual Therapy Myofascial release   Myofascial Release cerv/trap/rhom                  PT Short Term Goals - 04/26/14 1254    PT SHORT TERM GOAL #1   Title independent with initial HEP   Status On-going           PT Long Term Goals - 04/18/14 1426    PT LONG TERM GOAL #1   Title independent with proper posture and body mechanics   Time 8   Period Weeks   Status  New   PT LONG TERM GOAL #2   Title increase cervical ROM 25%   Time 8   Period Weeks   Status New   PT LONG TERM GOAL #3   Title decrease pain 25%   Time 8   Period Weeks   Status New   PT LONG TERM GOAL #4   Title tolerate activity for 15 minutes   Time 8   Period Weeks   Status New               Plan - 04/26/14 1254    Clinical Impression Statement pt required cuing verb and tactile ot relax with STW, large knots and spasms present through out. very poor posture        Problem List Patient Active Problem List   Diagnosis Date Noted  . Oxygen desaturation 12/25/2013  . Respiratory failure, chronic 12/20/2013  . Neck pain on right side 12/18/2013  . Chronic diastolic CHF (congestive heart failure) 10/12/2013  . Osteoporosis 11/29/2012  . Easy bruising 10/01/2012  . Oral thrush 09/24/2012  . Other screening mammogram 12/25/2011  . Screening for colon cancer 12/25/2011  . Impaired mobility 11/26/2011  . 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 Long term  current use of opiate analgesic 10/30/2011  . GERD (gastroesophageal reflux disease) 10/30/2011  . Chronic cough 08/28/2011  . Elevated MCV 08/28/2011  . Financial difficulties 04/22/2011  . Preventative health care 10/30/2010  . Anxiety 10/30/2010  . HYPERLIPIDEMIA 05/24/2009  . NICOTINE ADDICTION 01/07/2009  . Osteoarthritis 11/28/2008  . COPD (chronic obstructive pulmonary disease) 03/28/2008  . OBSESSIVE-COMPULSIVE DISORDER 12/29/2005  . DEPRESSION 12/29/2005  . Essential hypertension 12/29/2005  . CARDIOMYOPATHY 12/29/2005  . Atrial fibrillation 12/29/2005  . CHOLELITHIASIS 12/29/2005  . INSOMNIA 12/29/2005    Caysie Minnifield,ANGIE,PTA 04/26/2014, 12:55 PM  [address] [place], [place], [address] Phone: [phone]   Fax:  [phone]

## 2014-04-28 ENCOUNTER — Encounter (HOSPITAL_COMMUNITY): Payer: Self-pay | Admitting: *Deleted

## 2014-04-28 ENCOUNTER — Emergency Department (HOSPITAL_COMMUNITY): Payer: Medicare Other

## 2014-04-28 ENCOUNTER — Emergency Department (HOSPITAL_COMMUNITY)
Admission: EM | Admit: 2014-04-28 | Discharge: 2014-04-28 | Disposition: A | Discharge status: Home / Self Care | Payer: Medicare Other | Attending: Emergency Medicine | Admitting: Emergency Medicine

## 2014-04-28 DIAGNOSIS — I509 Heart failure, unspecified: Secondary | ICD-10-CM | POA: Diagnosis not present

## 2014-04-28 DIAGNOSIS — Z8669 Personal history of other diseases of the nervous system and sense organs: Secondary | ICD-10-CM | POA: Insufficient documentation

## 2014-04-28 DIAGNOSIS — Z7982 Long term (current) use of aspirin: Secondary | ICD-10-CM | POA: Insufficient documentation

## 2014-04-28 DIAGNOSIS — I4891 Unspecified atrial fibrillation: Secondary | ICD-10-CM | POA: Diagnosis not present

## 2014-04-28 DIAGNOSIS — R0602 Shortness of breath: Secondary | ICD-10-CM | POA: Diagnosis present

## 2014-04-28 DIAGNOSIS — Z8639 Personal history of other endocrine, nutritional and metabolic disease: Secondary | ICD-10-CM | POA: Insufficient documentation

## 2014-04-28 DIAGNOSIS — M1990 Unspecified osteoarthritis, unspecified site: Secondary | ICD-10-CM | POA: Diagnosis not present

## 2014-04-28 DIAGNOSIS — Z79899 Other long term (current) drug therapy: Secondary | ICD-10-CM | POA: Diagnosis not present

## 2014-04-28 DIAGNOSIS — Z720 Tobacco use: Secondary | ICD-10-CM | POA: Insufficient documentation

## 2014-04-28 DIAGNOSIS — Z7951 Long term (current) use of inhaled steroids: Secondary | ICD-10-CM | POA: Diagnosis not present

## 2014-04-28 DIAGNOSIS — I10 Essential (primary) hypertension: Secondary | ICD-10-CM | POA: Diagnosis not present

## 2014-04-28 DIAGNOSIS — K219 Gastro-esophageal reflux disease without esophagitis: Secondary | ICD-10-CM | POA: Insufficient documentation

## 2014-04-28 DIAGNOSIS — F329 Major depressive disorder, single episode, unspecified: Secondary | ICD-10-CM | POA: Diagnosis not present

## 2014-04-28 DIAGNOSIS — Z8701 Personal history of pneumonia (recurrent): Secondary | ICD-10-CM | POA: Diagnosis not present

## 2014-04-28 DIAGNOSIS — F419 Anxiety disorder, unspecified: Secondary | ICD-10-CM | POA: Diagnosis not present

## 2014-04-28 DIAGNOSIS — J441 Chronic obstructive pulmonary disease with (acute) exacerbation: Secondary | ICD-10-CM | POA: Diagnosis not present

## 2014-04-28 LAB — BASIC METABOLIC PANEL
Anion gap: 8 (ref 5–15)
BUN: 7 mg/dL (ref 6–23)
CO2: 31 mmol/L (ref 19–32)
Calcium: 8.6 mg/dL (ref 8.4–10.5)
Chloride: 102 mmol/L (ref 96–112)
Creatinine, Ser: 0.88 mg/dL (ref 0.50–1.10)
GFR calc Af Amer: 77 mL/min — ABNORMAL LOW (ref >90)
GFR calc non Af Amer: 66 mL/min — ABNORMAL LOW (ref >90)
Glucose, Bld: 134 mg/dL — ABNORMAL HIGH (ref 70–99)
POTASSIUM: 3.2 mmol/L — AB (ref 3.5–5.1)
SODIUM: 141 mmol/L (ref 135–145)

## 2014-04-28 LAB — CBC WITH DIFFERENTIAL/PLATELET
BASOS ABS: 0 10*3/uL (ref 0.0–0.1)
BASOS PCT: 0 % (ref 0–1)
EOS PCT: 8 % — AB (ref 0–5)
Eosinophils Absolute: 0.7 10*3/uL (ref 0.0–0.7)
HCT: 38.2 % (ref 36.0–46.0)
HEMOGLOBIN: 12.3 g/dL (ref 12.0–15.0)
LYMPHS ABS: 2.2 10*3/uL (ref 0.7–4.0)
LYMPHS PCT: 25 % (ref 12–46)
MCH: 31.8 pg (ref 26.0–34.0)
MCHC: 32.2 g/dL (ref 30.0–36.0)
MCV: 98.7 fL (ref 78.0–100.0)
Monocytes Absolute: 0.7 10*3/uL (ref 0.1–1.0)
Monocytes Relative: 8 % (ref 3–12)
NEUTROS ABS: 5.2 10*3/uL (ref 1.7–7.7)
NEUTROS PCT: 59 % (ref 43–77)
PLATELETS: 242 10*3/uL (ref 150–400)
RBC: 3.87 MIL/uL (ref 3.87–5.11)
RDW: 14.8 % (ref 11.5–15.5)
WBC: 8.8 10*3/uL (ref 4.0–10.5)

## 2014-04-28 LAB — BRAIN NATRIURETIC PEPTIDE: B NATRIURETIC PEPTIDE 5: 209.4 pg/mL — AB (ref 0.0–100.0)

## 2014-04-28 MED ORDER — ALBUTEROL SULFATE HFA 108 (90 BASE) MCG/ACT IN AERS
1.0000 | INHALATION_SPRAY | Freq: Four times a day (QID) | RESPIRATORY_TRACT | Status: DC | PRN
Start: 2014-04-28 — End: 2014-10-19

## 2014-04-28 MED ORDER — HYDROCODONE-ACETAMINOPHEN 5-325 MG PO TABS
1.0000 | ORAL_TABLET | Freq: Once | ORAL | Status: AC
  Administered 2014-04-28: 1 via ORAL
  Filled 2014-04-28: qty 1

## 2014-04-28 MED ORDER — LEVOFLOXACIN 500 MG PO TABS: 500.0000 mg | ORAL_TABLET | Freq: Every day | ORAL | Status: DC

## 2014-04-28 MED ORDER — ALBUTEROL (5 MG/ML) CONTINUOUS INHALATION SOLN
10.0000 mg/h | INHALATION_SOLUTION | Freq: Once | RESPIRATORY_TRACT | Status: AC
  Administered 2014-04-28: 10 mg/h via RESPIRATORY_TRACT
  Filled 2014-04-28: qty 40

## 2014-04-28 MED ORDER — METHYLPREDNISOLONE SODIUM SUCC 125 MG IJ SOLR
125.0000 mg | Freq: Once | INTRAMUSCULAR | Status: AC
  Administered 2014-04-28: 125 mg via INTRAVENOUS
  Filled 2014-04-28: qty 2

## 2014-04-28 MED ORDER — PREDNISONE 20 MG PO TABS: 20.0000 mg | ORAL_TABLET | Freq: Every day | ORAL | Status: DC

## 2014-04-28 MED ORDER — GUAIFENESIN ER 600 MG PO TB12: 600.0000 mg | ORAL_TABLET | Freq: Two times a day (BID) | ORAL | Status: DC

## 2014-04-28 NOTE — ED Notes (Addendum)
Pt arrives from home via [facility]. Pt states she woke up around 0300 with SOB that has gradually gotten worse throughout the day. EMS states pt bp has been trending downward since they have been en route. Pt has had a 5mg  neb of albuterol PTA which has worsened her nonproductive cough. Pt has a hx of COPD AND CHF and is currently smoking 1 ppd. Pt states that she has OCD, and a form that causes her to panic and hurt herself. Pt states any bruises noted on her body are self inflicted.

## 2014-04-28 NOTE — ED Notes (Signed)
Pt is in stable condition upon d/c and is escorted from ED via wheelchair by staff.

## 2014-04-28 NOTE — Discharge Instructions (Signed)
Chronic Obstructive Pulmonary Disease Exacerbation  Chronic obstructive pulmonary disease (COPD) is a common lung problem. In COPD, the flow of air from the lungs is limited. COPD exacerbations are times that breathing gets worse and you need extra treatment. Without treatment they can be life threatening. If they happen often, your lungs can become more damaged. HOME CARE  Do not smoke.  Avoid tobacco smoke and other things that bother your lungs.  If given, take your antibiotic medicine as told. Finish the medicine even if you start to feel better.  Only take medicines as told by your doctor.  Drink enough fluids to keep your pee (urine) clear or pale yellow (unless your doctor has told you not to).  Use a cool mist machine (vaporizer).  If you use oxygen or a machine that turns liquid medicine into a mist (nebulizer), continue to use them as told.  Keep up with shots (vaccinations) as told by your doctor.  Exercise regularly.  Eat healthy foods.  Keep all doctor visits as told. GET HELP RIGHT AWAY IF:  You are very short of breath and it gets worse.  You have trouble talking.  You have bad chest pain.  You have blood in your spit (sputum).  You have a fever.  You keep throwing up (vomiting).  You feel weak, or you pass out (faint).  You feel confused.  You keep getting worse. MAKE SURE YOU:   Understand these instructions.  Will watch your condition.  Will get help right away if you are not doing well or get worse. Document Released: 01/15/2011 Document Revised: 11/16/2012 Document Reviewed: 09/30/2012 [facility] Patient Information 2015 [place], [place]. This information is not intended to replace advice given to you by your health care provider. Make sure you discuss any questions you have with your health care provider.

## 2014-04-28 NOTE — ED Provider Notes (Signed)
CSN: [id number]     Arrival date & time 04/28/14  1814 History   First MD Initiated Contact with Patient 04/28/14 1815     Chief Complaint  Patient presents with  . Shortness of Breath      HPI  Vision presents for evaluation of shortness of breath. States his last time she felt this was 4 years ago when she had a flareup of her COPD. She continues to smoke. She uses home albuterol MDIs. Does not use a nebulizer. Has not been on prednisone, or evaluated by physician for an exacerbation anytime recently. No fevers chills. No increase in her sputum. States he feels like she needs to cough something up but cannot. Denies any pain.  Nebulizer en route per paramedics and states it did help somewhat.  Takes Lasix. States this is primarily for leg swelling. States she may have had an episode of congestive heart failure in the past.  Past Medical History  Diagnosis Date  . Atrial fibrillation   . Hypertension   . Insomnia   . Nonischemic cardiomyopathy   . Hyperlipidemia   . Anxiety   . Cholelithiasis   . Insomnia   . Mitral regurgitation     noted 2010  . H/O epistaxis   . Rhinitis, allergic   . CHF (congestive heart failure)   . Pneumonia     "several times w/exacerbations of the COPD; nothing in the last year" (07/15/2012)  . COPD (chronic obstructive pulmonary disease)     as of 7/13 on 2-3L, pfts 10/2008 with mod obstruction  . Chronic bronchitis with COPD (chronic obstructive pulmonary disease)   . Shortness of breath     "all the time right now" (07/15/2012)  . GERD (gastroesophageal reflux disease)   . MGQQPYPP(509.3)     "weekly" (07/15/2012)  . Migraines     "weekly for awhile; cleared up as I got older" (07/15/2012)  . DJD (degenerative joint disease)   . Arthritis     "all over" (07/15/2012)  . OCD (obsessive compulsive disorder)   . OCD (obsessive compulsive disorder)   . Depression     h/o SI; "last time I was really serious about it was ~ 1997" (07/15/2012)   Past  Surgical History  Procedure Laterality Date  . Tubal ligation  1972  . Cardioversion  2003; 07/2003   Family History  Problem Relation Age of Onset  . Asthma    . Emphysema    . Allergies    . Cancer      aunt had several types of cancer   History  Substance Use Topics  . Smoking status: Current Every Day Smoker -- 1.00 packs/day for 35 years    Types: Cigarettes  . Smokeless tobacco: Never Used     Comment: No desire to quit at this time.  . Alcohol Use: No   OB History    No data available     Review of Systems  Constitutional: Negative for fever, chills, diaphoresis, appetite change and fatigue.  HENT: Negative for mouth sores, sore throat and trouble swallowing.   Eyes: Negative for visual disturbance.  Respiratory: Positive for cough, chest tightness, shortness of breath and wheezing.   Cardiovascular: Negative for chest pain.  Gastrointestinal: Negative for nausea, vomiting, abdominal pain, diarrhea and abdominal distention.  Endocrine: Negative for polydipsia, polyphagia and polyuria.  Genitourinary: Negative for dysuria, frequency and hematuria.  Musculoskeletal: Negative for gait problem.  Skin: Negative for color change, pallor and rash.  Neurological: Negative  for dizziness, syncope, light-headedness and headaches.  Hematological: Does not bruise/bleed easily.  Psychiatric/Behavioral: Negative for behavioral problems and confusion.      Allergies  Ace inhibitors; Diphenhydramine hcl; Erythromycin; Nsaids; Nyquil; Tramadol hcl; Flexeril; and Pseudoephedrine  Home Medications   Prior to Admission medications   Medication Sig Start Date End Date Taking? Authorizing Provider  albuterol (PROAIR HFA) 108 (90 BASE) MCG/ACT inhaler Inhale 2 puffs into the lungs every 6 (six) hours as needed for wheezing or shortness of breath. 01/03/14   Dellia Nims, MD  aspirin 325 MG tablet Take 650 mg by mouth daily.     Historical Provider, MD  budesonide-formoterol  (SYMBICORT) 160-4.5 MCG/ACT inhaler Inhale 2 puffs into the lungs 2 (two) times daily. 06/05/13   Annia Belt, MD  busPIRone (BUSPAR) 5 MG tablet Take 40 mg by mouth 3 (three) times daily. 11/16/13   Historical Provider, MD  chlorpheniramine (CHLOR-TRIMETON) 4 MG tablet Take 1 tablet (4 mg total) by mouth every 8 (eight) hours as needed for allergies. 11/13/13   Dellia Nims, MD  diazepam (VALIUM) 2 MG tablet Take 1 tablet (2 mg total) by mouth every 12 (twelve) hours as needed for anxiety. 12/06/12   Wilber Oliphant, MD  diltiazem (TIAZAC) 420 MG 24 hr capsule Take 1 capsule (420 mg total) by mouth daily. 05/04/13 08/03/14  Pollie Friar, MD  FLUoxetine (PROZAC) 20 MG capsule TAKE THREE CAPSULES BY MOUTH ONCE DAILY Patient taking differently: Take 60 mg by mouth. TAKE THREE CAPSULES BY MOUTH ONCE DAILY 01/03/14   Dellia Nims, MD  furosemide (LASIX) 40 MG tablet TAKE TWO TABLETS BY MOUTH TWICE DAILY 01/03/14   Dellia Nims, MD  HYDROcodone-acetaminophen (NORCO) 7.5-325 MG per tablet Take 1 tablet by mouth every 6 (six) hours as needed. 04/16/14 05/11/15  Tasrif Ahmed, MD  HYDROcodone-homatropine (HYCODAN) 5-1.5 MG/5ML syrup Take 5 mLs by mouth every 6 (six) hours as needed for cough. 04/16/14   Tasrif Ahmed, MD  nystatin (MYCOSTATIN) 100000 UNIT/ML suspension Take 5 mLs (500,000 Units total) by mouth as needed. 04/16/14   Tasrif Ahmed, MD  pantoprazole (PROTONIX) 40 MG tablet TAKE ONE TABLET BY MOUTH TWICE DAILY 01/03/14   Dellia Nims, MD  potassium chloride SA (K-DUR,KLOR-CON) 20 MEQ tablet Take 0.5 tablets (10 mEq total) by mouth 2 (two) times daily. 01/03/14   Dellia Nims, MD  tiotropium (SPIRIVA) 18 MCG inhalation capsule Place 1 capsule (18 mcg total) into inhaler and inhale daily. 07/05/13   Pollie Friar, MD  zolpidem (AMBIEN) 5 MG tablet Take 1 tablet (5 mg total) by mouth at bedtime as needed for sleep. 03/09/14   Tasrif Ahmed, MD   BP 124/77 mmHg  Pulse 73  Temp(Src) 97.5 F (36.4 C) (Oral)   Resp 19  Ht 5\' 5"  (1.651 m)  Wt 199 lb 3 oz (90.351 kg)  BMI 33.15 kg/m2  SpO2 97% Physical Exam  Constitutional: She is oriented to person, place, and time. She appears well-developed and well-nourished. No distress.  HENT:  Head: Normocephalic.  Eyes: Conjunctivae are normal. Pupils are equal, round, and reactive to light. No scleral icterus.  Neck: Normal range of motion. Neck supple. No thyromegaly present.  Cardiovascular: Normal rate and regular rhythm.  Exam reveals no gallop and no friction rub.   No murmur heard. Pulmonary/Chest: No respiratory distress. She has wheezes. She has no rales.  Wheezing and prolongation in all fields. Prolonged expiratory phase. Globally diminished breath sounds.  Abdominal: Soft. Bowel sounds are normal.  She exhibits no distension. There is no tenderness. There is no rebound.  Musculoskeletal: Normal range of motion.  Neurological: She is alert and oriented to person, place, and time.  Skin: Skin is warm and dry. No rash noted.  Psychiatric: She has a normal mood and affect. Her behavior is normal.    ED Course  Procedures (including critical care time) Labs Review Labs Reviewed  BRAIN NATRIURETIC PEPTIDE - Abnormal; Notable for the following:    B Natriuretic Peptide 209.4 (*)    All other components within normal limits  CBC WITH DIFFERENTIAL/PLATELET - Abnormal; Notable for the following:    Eosinophils Relative 8 (*)    All other components within normal limits  BASIC METABOLIC PANEL - Abnormal; Notable for the following:    Potassium 3.2 (*)    Glucose, Bld 134 (*)    GFR calc non Af Amer 66 (*)    GFR calc Af Amer 77 (*)    All other components within normal limits    Imaging Review Dg Chest 2 View  04/28/2014   CLINICAL DATA:  Worsening dyspnea  EXAM: CHEST  2 VIEW  COMPARISON:  11/29/2013  FINDINGS: The heart size and mediastinal contours are within normal limits. Both lungs are clear. The visualized skeletal structures are  unremarkable.  IMPRESSION: No active cardiopulmonary disease.   Electronically Signed   By: Andreas Newport M.D.   On: 04/28/2014 21:08     EKG Interpretation None      MDM   Final diagnoses:  SOB (shortness of breath)    After one half hour of continuous no last albuterol patient reexamined. Had been given IV Solu-Medrol as well. Her work of breathing is much improved. She states she feels "much much better". 98% saturation on her 2 L nasal cannula which she is on chronically. Chest x-ray shows COPD changes but no infiltrates. No leukocytosis. Not hypoxemic or febrile. Think should appropriate for home treatment. Plan is anabolic, continue bronchodilators, mucolytic's, antibiotics, she states in no uncertain terms that she has no intention of stopping smoking. Primary care follow-up.    Tanna Furry, MD 04/28/14 2133

## 2014-04-30 ENCOUNTER — Encounter: Payer: Self-pay | Admitting: Internal Medicine

## 2014-04-30 ENCOUNTER — Other Ambulatory Visit: Payer: Self-pay | Admitting: *Deleted

## 2014-04-30 DIAGNOSIS — R053 Chronic cough: Secondary | ICD-10-CM

## 2014-04-30 DIAGNOSIS — M1610 Unilateral primary osteoarthritis, unspecified hip: Secondary | ICD-10-CM

## 2014-04-30 DIAGNOSIS — R05 Cough: Secondary | ICD-10-CM

## 2014-04-30 MED ORDER — HYDROCODONE-ACETAMINOPHEN 7.5-325 MG PO TABS: 1.0000 | ORAL_TABLET | Freq: Four times a day (QID) | ORAL | Status: DC | PRN

## 2014-04-30 MED ORDER — HYDROCODONE-HOMATROPINE 5-1.5 MG/5ML PO SYRP: 5.0000 mL | ORAL_SOLUTION | Freq: Four times a day (QID) | ORAL | Status: DC | PRN

## 2014-04-30 NOTE — Telephone Encounter (Signed)
Dr. Lynnae January, what do you suggest I do for this patient? This is the one who we had problem with last time and decided to discharge from clinic.

## 2014-04-30 NOTE — Telephone Encounter (Signed)
Dismissal letter in [employer]. We are obligated to provide 30 days refills of any medically neccessary medications after notification so I am providing one month supply of both meds. [facility] will need to cont to provide refills for next 30 days if indicated. Letter and paper scripts will be handed to pt tomorrow.

## 2014-05-07 ENCOUNTER — Other Ambulatory Visit (INDEPENDENT_AMBULATORY_CARE_PROVIDER_SITE_OTHER): Payer: Medicare Other | Admitting: *Deleted

## 2014-05-07 DIAGNOSIS — E876 Hypokalemia: Secondary | ICD-10-CM

## 2014-05-07 LAB — BASIC METABOLIC PANEL
BUN: 17 mg/dL (ref 6–23)
CALCIUM: 9.1 mg/dL (ref 8.4–10.5)
CO2: 36 mEq/L — ABNORMAL HIGH (ref 19–32)
Chloride: 99 mEq/L (ref 96–112)
Creatinine, Ser: 0.87 mg/dL (ref 0.40–1.20)
GFR: 68.95 mL/min (ref >60.00)
Glucose, Bld: 87 mg/dL (ref 70–99)
Potassium: 3.3 mEq/L — ABNORMAL LOW (ref 3.5–5.1)
Sodium: 140 mEq/L (ref 135–145)

## 2014-05-07 NOTE — Addendum Note (Signed)
Addended by: Eulis Foster on: 05/07/2014 10:48 AM   Modules accepted: Orders

## 2014-05-08 ENCOUNTER — Encounter: Payer: Self-pay | Admitting: Physical Therapy

## 2014-05-08 ENCOUNTER — Ambulatory Visit: Payer: Medicare Other | Admitting: Physical Therapy

## 2014-05-08 DIAGNOSIS — M542 Cervicalgia: Secondary | ICD-10-CM

## 2014-05-08 NOTE — Therapy (Addendum)
[address] [place], [place], [address] Phone: [phone]   Fax:  [phone]  Physical Therapy Treatment  Patient Details  Name: Kelsey Garcia MRN: [id number] Date of Birth: Dec 27, 1945 Referring Provider:  Thurman Coyer, DO  Encounter Date: 05/08/2014      PT End of Session - 05/08/14 1317    Visit Number 3   Number of Visits 16   Date for PT Re-Evaluation 06/18/14   PT Start Time 1310   PT Stop Time 1400   PT Time Calculation (min) 50 min      Past Medical History  Diagnosis Date  . Atrial fibrillation   . Hypertension   . Insomnia   . Nonischemic cardiomyopathy   . Hyperlipidemia   . Anxiety   . Cholelithiasis   . Insomnia   . Mitral regurgitation     noted 2010  . H/O epistaxis   . Rhinitis, allergic   . CHF (congestive heart failure)   . Pneumonia     "several times w/exacerbations of the COPD; nothing in the last year" (07/15/2012)  . COPD (chronic obstructive pulmonary disease)     as of 7/13 on 2-3L, pfts 10/2008 with mod obstruction  . Chronic bronchitis with COPD (chronic obstructive pulmonary disease)   . Shortness of breath     "all the time right now" (07/15/2012)  . GERD (gastroesophageal reflux disease)   . KXFGHWEX(937.1)     "weekly" (07/15/2012)  . Migraines     "weekly for awhile; cleared up as I got older" (07/15/2012)  . DJD (degenerative joint disease)   . Arthritis     "all over" (07/15/2012)  . OCD (obsessive compulsive disorder)   . OCD (obsessive compulsive disorder)   . Depression     h/o SI; "last time I was really serious about it was ~ 1997" (07/15/2012)    Past Surgical History  Procedure Laterality Date  . Tubal ligation  1972  . Cardioversion  2003; 07/2003    There were no vitals filed for this visit.  Visit Diagnosis:  Cervical pain      Subjective Assessment - 05/08/14 1312    Symptoms 4 hours relief after last session   Currently in Pain? Yes    Pain Score 10-Worst pain ever   Pain Location Neck   Pain Orientation Right                       [facility] Adult PT Treatment/Exercise - 05/08/14 0001    Posture/Postural Control   Posture Comments standing 3 # shruggs and backward rolls 12 times each, standing yellow tband 12 reps scap stab 3 way   Modalities   Modalities Electrical Stimulation;Moist Heat   Moist Heat Therapy   Number Minutes Moist Heat 15 Minutes   Moist Heat Location --  trap/rhom   Electrical Stimulation   Electrical Stimulation Location --  bilateral trap/rhom   Electrical Stimulation Parameters IFC   Electrical Stimulation Goals Pain   Manual Therapy   Manual Therapy Myofascial release   Myofascial Release cerv/trap/rhom                  PT Short Term Goals - 05/08/14 1336    PT SHORT TERM GOAL #1   Title independent with initial HEP   Status Achieved           PT Long Term Goals - 05/08/14 1337  PT LONG TERM GOAL #1   Title independent with proper posture and body mechanics   Status On-going   PT LONG TERM GOAL #2   Title increase cervical ROM 25%   Status On-going               Plan - 05/08/14 1335    Clinical Impression Statement pt still with high c/o pain but tolerated manual therapy better, improved ROM after STW     PHYSICAL THERAPY DISCHARGE SUMMARY   Plan: Patient agrees to discharge.  Patient goals were not met. Patient is being discharged due to not returning since the last visit.  ?????        Problem List Patient Active Problem List   Diagnosis Date Noted  . Oxygen desaturation 12/25/2013  . Respiratory failure, chronic 12/20/2013  . Neck pain on right side 12/18/2013  . Chronic diastolic CHF (congestive heart failure) 10/12/2013  . Osteoporosis 11/29/2012  . Easy bruising 10/01/2012  . Oral thrush 09/24/2012  . Other screening mammogram 12/25/2011  . Screening for colon cancer 12/25/2011  . Impaired mobility 11/26/2011  .  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 Long term current use of opiate analgesic 10/30/2011  . GERD (gastroesophageal reflux disease) 10/30/2011  . Chronic cough 08/28/2011  . Elevated MCV 08/28/2011  . Financial difficulties 04/22/2011  . Preventative health care 10/30/2010  . Anxiety 10/30/2010  . HYPERLIPIDEMIA 05/24/2009  . NICOTINE ADDICTION 01/07/2009  . Osteoarthritis 11/28/2008  . COPD (chronic obstructive pulmonary disease) 03/28/2008  . OBSESSIVE-COMPULSIVE DISORDER 12/29/2005  . DEPRESSION 12/29/2005  . Essential hypertension 12/29/2005  . CARDIOMYOPATHY 12/29/2005  . Atrial fibrillation 12/29/2005  . CHOLELITHIASIS 12/29/2005  . INSOMNIA 12/29/2005    Mekisha Bittel,ANGIE PTA 05/08/2014, 1:40 PM  [address] [place], [place], [address] Phone: [phone]   Fax:  [phone]

## 2014-05-09 ENCOUNTER — Encounter: Payer: Self-pay | Admitting: Internal Medicine

## 2014-05-09 ENCOUNTER — Ambulatory Visit (INDEPENDENT_AMBULATORY_CARE_PROVIDER_SITE_OTHER): Payer: Medicare Other | Admitting: Internal Medicine

## 2014-05-09 ENCOUNTER — Other Ambulatory Visit: Payer: Self-pay | Admitting: *Deleted

## 2014-05-09 VITALS — BP 108/70 | HR 108 | Temp 98.7°F | Ht 65.0 in | Wt 200.1 lb

## 2014-05-09 DIAGNOSIS — I10 Essential (primary) hypertension: Secondary | ICD-10-CM

## 2014-05-09 DIAGNOSIS — J431 Panlobular emphysema: Secondary | ICD-10-CM

## 2014-05-09 DIAGNOSIS — E876 Hypokalemia: Secondary | ICD-10-CM

## 2014-05-09 MED ORDER — DILTIAZEM HCL ER BEADS 420 MG PO CP24: 420.0000 mg | ORAL_CAPSULE | Freq: Every day | ORAL | Status: DC

## 2014-05-09 NOTE — Telephone Encounter (Signed)
Received refill request from pt's pharmacy.  Pt's pcpTasrif Ahmed, MD is unavailable through 05/10/2014, will send to attending for review.[name]3/30/201610:59 AM
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
## 2014-05-09 NOTE — Patient Instructions (Signed)
It was a pleasure to see you today. Please use the inhalers with spacer as we discussed. Please seek medical attention if you have any new or worsening trouble breathing, cough, or other worrisome medical condition.   Lottie Mussel, MD  General Instructions:   Please try to bring all your medicines next time. This will help [place] keep you safe from mistakes.   Progress Toward Treatment Goals:  Treatment Goal 05/09/2014  Blood pressure at goal  Stop smoking smoking the same amount    Self Care Goals & Plans:  Self Care Goal 10/12/2013  Manage my medications take my medicines as prescribed; bring my medications to every visit; refill my medications on time  Monitor my health keep track of my blood pressure  Eat healthy foods eat more vegetables; eat foods that are low in salt; eat baked foods instead of fried foods  Be physically active find an activity I enjoy  Stop smoking go to the [employer] ([email or web address].[email or web address]); set a quit date and stop smoking    No flowsheet data found.   Care Management & Community Referrals:  Referral 09/23/2012  Referrals made for care management support none needed  Referrals made to community resources -

## 2014-05-09 NOTE — Assessment & Plan Note (Signed)
Ms Haisley was recently seen in ED for COPD exacerbation. Her symptoms improved with iv solumedrol and continuous albuterol nebulizer. She has completed a ten day course of prednisone 20 mg daily and levofloxacin but she still has a cough productive of white sputum with associated SOB. She has been using her rescue inhaler every 4-6 hours when she feels wheezy. On exam, she was sating 93% on 2L O2. Her respirations are unlabored and she was not wheezy when I listened to her lungs. She says she is compliant with her medicines. I offered a new spacer and she said she has had the same one for four years and would appreciate the new one. She walked me through how she uses it and correctly described the steps. She notes she holds the medicine in for about ten seconds before she exhales. She understands she is on maximum medical therapy -continue albuterol 2 puff inh q6hprn, symbicort 2 puff bid, spiriva 18 mcg daily

## 2014-05-09 NOTE — Progress Notes (Signed)
Case discussed with Dr. Rothman at time of visit. We reviewed the resident's history and exam and pertinent patient test results. I agree with the assessment, diagnosis, and plan of care documented in the resident's note. 

## 2014-05-10 ENCOUNTER — Inpatient Hospital Stay (HOSPITAL_COMMUNITY)
Admission: EM | Admit: 2014-05-10 | Discharge: 2014-05-22 | DRG: 193 | Disposition: A | Discharge status: Skilled Nursing Facility | Payer: Medicare Other | Attending: Internal Medicine | Admitting: Internal Medicine

## 2014-05-10 ENCOUNTER — Ambulatory Visit: Payer: Medicare Other | Admitting: Physical Therapy

## 2014-05-10 ENCOUNTER — Other Ambulatory Visit (HOSPITAL_COMMUNITY): Payer: Self-pay

## 2014-05-10 ENCOUNTER — Other Ambulatory Visit: Payer: Self-pay

## 2014-05-10 ENCOUNTER — Encounter (HOSPITAL_COMMUNITY): Payer: Self-pay | Admitting: Emergency Medicine

## 2014-05-10 ENCOUNTER — Emergency Department (HOSPITAL_COMMUNITY): Payer: Medicare Other

## 2014-05-10 DIAGNOSIS — J1000 Influenza due to other identified influenza virus with unspecified type of pneumonia: Principal | ICD-10-CM | POA: Diagnosis present

## 2014-05-10 DIAGNOSIS — R590 Localized enlarged lymph nodes: Secondary | ICD-10-CM

## 2014-05-10 DIAGNOSIS — M25511 Pain in right shoulder: Secondary | ICD-10-CM | POA: Diagnosis present

## 2014-05-10 DIAGNOSIS — F42 Obsessive-compulsive disorder: Secondary | ICD-10-CM | POA: Diagnosis present

## 2014-05-10 DIAGNOSIS — D61818 Other pancytopenia: Secondary | ICD-10-CM

## 2014-05-10 DIAGNOSIS — K219 Gastro-esophageal reflux disease without esophagitis: Secondary | ICD-10-CM | POA: Diagnosis present

## 2014-05-10 DIAGNOSIS — J441 Chronic obstructive pulmonary disease with (acute) exacerbation: Secondary | ICD-10-CM | POA: Diagnosis present

## 2014-05-10 DIAGNOSIS — Z79899 Other long term (current) drug therapy: Secondary | ICD-10-CM

## 2014-05-10 DIAGNOSIS — Z79891 Long term (current) use of opiate analgesic: Secondary | ICD-10-CM

## 2014-05-10 DIAGNOSIS — I429 Cardiomyopathy, unspecified: Secondary | ICD-10-CM | POA: Diagnosis present

## 2014-05-10 DIAGNOSIS — I951 Orthostatic hypotension: Secondary | ICD-10-CM | POA: Diagnosis not present

## 2014-05-10 DIAGNOSIS — R0602 Shortness of breath: Secondary | ICD-10-CM

## 2014-05-10 DIAGNOSIS — Z7951 Long term (current) use of inhaled steroids: Secondary | ICD-10-CM

## 2014-05-10 DIAGNOSIS — I5032 Chronic diastolic (congestive) heart failure: Secondary | ICD-10-CM | POA: Diagnosis present

## 2014-05-10 DIAGNOSIS — E785 Hyperlipidemia, unspecified: Secondary | ICD-10-CM | POA: Diagnosis present

## 2014-05-10 DIAGNOSIS — E876 Hypokalemia: Secondary | ICD-10-CM | POA: Diagnosis present

## 2014-05-10 DIAGNOSIS — G8929 Other chronic pain: Secondary | ICD-10-CM | POA: Diagnosis present

## 2014-05-10 DIAGNOSIS — Z7982 Long term (current) use of aspirin: Secondary | ICD-10-CM

## 2014-05-10 DIAGNOSIS — I340 Nonrheumatic mitral (valve) insufficiency: Secondary | ICD-10-CM | POA: Diagnosis present

## 2014-05-10 DIAGNOSIS — J101 Influenza due to other identified influenza virus with other respiratory manifestations: Secondary | ICD-10-CM | POA: Diagnosis present

## 2014-05-10 DIAGNOSIS — Y95 Nosocomial condition: Secondary | ICD-10-CM | POA: Diagnosis present

## 2014-05-10 DIAGNOSIS — I10 Essential (primary) hypertension: Secondary | ICD-10-CM | POA: Diagnosis present

## 2014-05-10 DIAGNOSIS — R0902 Hypoxemia: Secondary | ICD-10-CM

## 2014-05-10 DIAGNOSIS — J9622 Acute and chronic respiratory failure with hypercapnia: Secondary | ICD-10-CM | POA: Diagnosis present

## 2014-05-10 DIAGNOSIS — J9600 Acute respiratory failure, unspecified whether with hypoxia or hypercapnia: Secondary | ICD-10-CM | POA: Insufficient documentation

## 2014-05-10 DIAGNOSIS — F419 Anxiety disorder, unspecified: Secondary | ICD-10-CM | POA: Diagnosis present

## 2014-05-10 DIAGNOSIS — I4891 Unspecified atrial fibrillation: Secondary | ICD-10-CM | POA: Diagnosis present

## 2014-05-10 DIAGNOSIS — J189 Pneumonia, unspecified organism: Secondary | ICD-10-CM | POA: Diagnosis present

## 2014-05-10 DIAGNOSIS — J449 Chronic obstructive pulmonary disease, unspecified: Secondary | ICD-10-CM | POA: Diagnosis present

## 2014-05-10 DIAGNOSIS — Z9981 Dependence on supplemental oxygen: Secondary | ICD-10-CM

## 2014-05-10 DIAGNOSIS — R042 Hemoptysis: Secondary | ICD-10-CM

## 2014-05-10 DIAGNOSIS — E875 Hyperkalemia: Secondary | ICD-10-CM | POA: Diagnosis not present

## 2014-05-10 DIAGNOSIS — F17210 Nicotine dependence, cigarettes, uncomplicated: Secondary | ICD-10-CM | POA: Diagnosis present

## 2014-05-10 DIAGNOSIS — A419 Sepsis, unspecified organism: Secondary | ICD-10-CM | POA: Diagnosis not present

## 2014-05-10 DIAGNOSIS — J9621 Acute and chronic respiratory failure with hypoxia: Secondary | ICD-10-CM | POA: Diagnosis present

## 2014-05-10 DIAGNOSIS — M1990 Unspecified osteoarthritis, unspecified site: Secondary | ICD-10-CM | POA: Diagnosis present

## 2014-05-10 DIAGNOSIS — J9620 Acute and chronic respiratory failure, unspecified whether with hypoxia or hypercapnia: Secondary | ICD-10-CM

## 2014-05-10 DIAGNOSIS — E872 Acidosis: Secondary | ICD-10-CM | POA: Diagnosis present

## 2014-05-10 LAB — COMPREHENSIVE METABOLIC PANEL
ALT: 15 U/L (ref 0–35)
AST: 18 U/L (ref 0–37)
Albumin: 3 g/dL — ABNORMAL LOW (ref 3.5–5.2)
Alkaline Phosphatase: 101 U/L (ref 39–117)
Anion gap: 10 (ref 5–15)
BUN: 9 mg/dL (ref 6–23)
CALCIUM: 8.4 mg/dL (ref 8.4–10.5)
CO2: 31 mmol/L (ref 19–32)
Chloride: 98 mmol/L (ref 96–112)
Creatinine, Ser: 0.89 mg/dL (ref 0.50–1.10)
GFR, EST AFRICAN AMERICAN: 76 mL/min — AB (ref >90)
GFR, EST NON AFRICAN AMERICAN: 66 mL/min — AB (ref >90)
GLUCOSE: 115 mg/dL — AB (ref 70–99)
Potassium: 3.4 mmol/L — ABNORMAL LOW (ref 3.5–5.1)
SODIUM: 139 mmol/L (ref 135–145)
Total Bilirubin: 0.6 mg/dL (ref 0.3–1.2)
Total Protein: 6.2 g/dL (ref 6.0–8.3)

## 2014-05-10 LAB — CBC WITH DIFFERENTIAL/PLATELET
BASOS PCT: 0 % (ref 0–1)
Basophils Absolute: 0 10*3/uL (ref 0.0–0.1)
EOS PCT: 0 % (ref 0–5)
Eosinophils Absolute: 0 10*3/uL (ref 0.0–0.7)
HCT: 41.1 % (ref 36.0–46.0)
Hemoglobin: 13.5 g/dL (ref 12.0–15.0)
LYMPHS ABS: 0.7 10*3/uL (ref 0.7–4.0)
Lymphocytes Relative: 11 % — ABNORMAL LOW (ref 12–46)
MCH: 32.2 pg (ref 26.0–34.0)
MCHC: 32.8 g/dL (ref 30.0–36.0)
MCV: 98.1 fL (ref 78.0–100.0)
MONOS PCT: 11 % (ref 3–12)
Monocytes Absolute: 0.7 10*3/uL (ref 0.1–1.0)
Neutro Abs: 4.8 10*3/uL (ref 1.7–7.7)
Neutrophils Relative %: 78 % — ABNORMAL HIGH (ref 43–77)
PLATELETS: 188 10*3/uL (ref 150–400)
RBC: 4.19 MIL/uL (ref 3.87–5.11)
RDW: 15.2 % (ref 11.5–15.5)
WBC: 6.2 10*3/uL (ref 4.0–10.5)

## 2014-05-10 LAB — URINALYSIS, ROUTINE W REFLEX MICROSCOPIC
Bilirubin Urine: NEGATIVE
GLUCOSE, UA: NEGATIVE mg/dL
HGB URINE DIPSTICK: NEGATIVE
Ketones, ur: NEGATIVE mg/dL
Leukocytes, UA: NEGATIVE
Nitrite: NEGATIVE
Protein, ur: NEGATIVE mg/dL
Specific Gravity, Urine: 1.014 (ref 1.005–1.030)
Urobilinogen, UA: 1 mg/dL (ref 0.0–1.0)
pH: 7.5 (ref 5.0–8.0)

## 2014-05-10 LAB — I-STAT CG4 LACTIC ACID, ED: Lactic Acid, Venous: 1.27 mmol/L (ref 0.5–2.0)

## 2014-05-10 LAB — BRAIN NATRIURETIC PEPTIDE: B Natriuretic Peptide: 195.9 pg/mL — ABNORMAL HIGH (ref 0.0–100.0)

## 2014-05-10 MED ORDER — IPRATROPIUM-ALBUTEROL 0.5-2.5 (3) MG/3ML IN SOLN
3.0000 mL | RESPIRATORY_TRACT | Status: DC
  Administered 2014-05-10 – 2014-05-12 (×10): 3 mL via RESPIRATORY_TRACT
  Filled 2014-05-10 (×11): qty 3

## 2014-05-10 MED ORDER — SODIUM CHLORIDE 0.9 % IV BOLUS (SEPSIS)
500.0000 mL | Freq: Once | INTRAVENOUS | Status: AC
  Administered 2014-05-10: 500 mL via INTRAVENOUS

## 2014-05-10 MED ORDER — DOXYCYCLINE HYCLATE 100 MG PO TABS
100.0000 mg | ORAL_TABLET | Freq: Once | ORAL | Status: AC
  Administered 2014-05-10: 100 mg via ORAL
  Filled 2014-05-10: qty 1

## 2014-05-10 MED ORDER — ACETAMINOPHEN 325 MG PO TABS
650.0000 mg | ORAL_TABLET | Freq: Four times a day (QID) | ORAL | Status: DC | PRN
  Administered 2014-05-10: 650 mg via ORAL
  Filled 2014-05-10: qty 2

## 2014-05-10 MED ORDER — DEXTROSE 5 % IV SOLN
1.0000 g | Freq: Once | INTRAVENOUS | Status: AC
  Administered 2014-05-10: 1 g via INTRAVENOUS
  Filled 2014-05-10: qty 10

## 2014-05-10 MED ORDER — HYDROCODONE-ACETAMINOPHEN 7.5-325 MG/15ML PO SOLN: 10.0000 mL | Freq: Once | ORAL | Status: DC

## 2014-05-10 MED ORDER — HYDROCODONE-ACETAMINOPHEN 7.5-325 MG PO TABS
1.0000 | ORAL_TABLET | Freq: Once | ORAL | Status: AC
  Administered 2014-05-10: 1 via ORAL
  Filled 2014-05-10: qty 1

## 2014-05-10 MED ORDER — SODIUM CHLORIDE 0.9 % IV SOLN
INTRAVENOUS | Status: DC
  Administered 2014-05-10 – 2014-05-12 (×3): via INTRAVENOUS
  Administered 2014-05-12: 100 mL via INTRAVENOUS

## 2014-05-10 MED ORDER — IPRATROPIUM-ALBUTEROL 0.5-2.5 (3) MG/3ML IN SOLN
3.0000 mL | Freq: Once | RESPIRATORY_TRACT | Status: AC
  Administered 2014-05-10: 3 mL via RESPIRATORY_TRACT
  Filled 2014-05-10: qty 3
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
## 2014-05-10 NOTE — ED Provider Notes (Signed)
CSN: [id number]     Arrival date & time 05/10/14  1934 History   First MD Initiated Contact with Patient 05/10/14 1957     Chief Complaint  Patient presents with  . Shortness of Breath  . Fever     (Consider location/radiation/quality/duration/timing/severity/associated sxs/prior Treatment) The history is provided by the patient and the EMS personnel.   the patient arrived in respiratory distress. Patient received Solu-Medrol by EMS and albuterol Atrovent. Patient still working hard to breathe. Patient normally on 2 L of oxygen at all times. Patient states she had acute worsening of her shortness of breath today. Had a fever up to 103. Patient denies any significant chest pain. Patient also has a history of CHF COPD and known atrial fib. EMS noted that the heart rate was fast and irregular consistent with her atrial fib.    Past Medical History  Diagnosis Date  . Atrial fibrillation   . Hypertension   . Insomnia   . Nonischemic cardiomyopathy   . Hyperlipidemia   . Anxiety   . Cholelithiasis   . Insomnia   . Mitral regurgitation     noted 2010  . H/O epistaxis   . Rhinitis, allergic   . CHF (congestive heart failure)   . Pneumonia     "several times w/exacerbations of the COPD; nothing in the last year" (07/15/2012)  . COPD (chronic obstructive pulmonary disease)     as of 7/13 on 2-3L, pfts 10/2008 with mod obstruction  . Chronic bronchitis with COPD (chronic obstructive pulmonary disease)   . Shortness of breath     "all the time right now" (07/15/2012)  . GERD (gastroesophageal reflux disease)   . WUJWJXBJ(478.2)     "weekly" (07/15/2012)  . Migraines     "weekly for awhile; cleared up as I got older" (07/15/2012)  . DJD (degenerative joint disease)   . Arthritis     "all over" (07/15/2012)  . OCD (obsessive compulsive disorder)   . OCD (obsessive compulsive disorder)   . Depression     h/o SI; "last time I was really serious about it was ~ 1997" (07/15/2012)   Past Surgical  History  Procedure Laterality Date  . Tubal ligation  1972  . Cardioversion  2003; 07/2003   Family History  Problem Relation Age of Onset  . Asthma    . Emphysema    . Allergies    . Cancer      aunt had several types of cancer   History  Substance Use Topics  . Smoking status: Current Every Day Smoker -- 1.00 packs/day for 35 years    Types: Cigarettes  . Smokeless tobacco: Never Used     Comment: No desire to quit at this time./ 1PPD  . Alcohol Use: No   OB History    No data available     Review of Systems  Constitutional: Positive for fever.  HENT: Positive for congestion.   Eyes: Negative for visual disturbance.  Respiratory: Positive for shortness of breath and wheezing.   Cardiovascular: Negative for chest pain.  Gastrointestinal: Negative for abdominal pain.  Genitourinary: Negative for dysuria.  Musculoskeletal: Negative for back pain.  Skin: Negative for rash.  Neurological: Negative for headaches.  Hematological: Does not bruise/bleed easily.  Psychiatric/Behavioral: Negative for confusion.      Allergies  Ace inhibitors; Diphenhydramine hcl; Erythromycin; Nsaids; Nyquil; Tramadol hcl; Flexeril; and Pseudoephedrine  Home Medications   Prior to Admission medications   Medication Sig Start Date End Date  Taking? Authorizing Provider  albuterol (PROVENTIL HFA;VENTOLIN HFA) 108 (90 BASE) MCG/ACT inhaler Inhale 1-2 puffs into the lungs every 6 (six) hours as needed for wheezing. 04/28/14  Yes Tanna Furry, MD  aspirin 325 MG tablet Take 650 mg by mouth daily.    Yes Historical Provider, MD  budesonide-formoterol (SYMBICORT) 160-4.5 MCG/ACT inhaler Inhale 2 puffs into the lungs 2 (two) times daily. 06/05/13  Yes Annia Belt, MD  busPIRone (BUSPAR) 10 MG tablet Take 10 mg by mouth 3 (three) times daily. 04/22/14  Yes Historical Provider, MD  chlorpheniramine (CHLOR-TRIMETON) 4 MG tablet Take 1 tablet (4 mg total) by mouth every 8 (eight) hours as needed for  allergies. 11/13/13  Yes Tasrif Ahmed, MD  diazepam (VALIUM) 2 MG tablet Take 1 tablet (2 mg total) by mouth every 12 (twelve) hours as needed for anxiety. 12/06/12  Yes Wilber Oliphant, MD  diltiazem (TIAZAC) 420 MG 24 hr capsule Take 1 capsule (420 mg total) by mouth daily. 05/09/14 08/08/15 Yes Bartholomew Crews, MD  FLUoxetine (PROZAC) 20 MG capsule TAKE THREE CAPSULES BY MOUTH ONCE DAILY Patient taking differently: Take 60 mg by mouth. TAKE THREE CAPSULES BY MOUTH ONCE DAILY 01/03/14  Yes Tasrif Ahmed, MD  furosemide (LASIX) 40 MG tablet TAKE TWO TABLETS BY MOUTH TWICE DAILY 01/03/14  Yes Tasrif Ahmed, MD  HYDROcodone-acetaminophen (NORCO) 7.5-325 MG per tablet Take 1 tablet by mouth every 6 (six) hours as needed. 04/30/14 05/25/15 Yes Bartholomew Crews, MD  HYDROcodone-homatropine [facility] 5-1.5 MG/5ML syrup Take 5 mLs by mouth every 6 (six) hours as needed for cough. 04/30/14  Yes Bartholomew Crews, MD  nystatin (MYCOSTATIN) 100000 UNIT/ML suspension Take 5 mLs (500,000 Units total) by mouth as needed. Patient taking differently: Take 500,000 Units by mouth as needed (thrush).  04/16/14  Yes Tasrif Ahmed, MD  pantoprazole (PROTONIX) 40 MG tablet TAKE ONE TABLET BY MOUTH TWICE DAILY 01/03/14  Yes Tasrif Ahmed, MD  potassium chloride SA (K-DUR,KLOR-CON) 20 MEQ tablet Take 0.5 tablets (10 mEq total) by mouth 2 (two) times daily. Patient taking differently: Take 60 mEq by mouth daily.  01/03/14  Yes Tasrif Ahmed, MD  tiotropium (SPIRIVA) 18 MCG inhalation capsule Place 1 capsule (18 mcg total) into inhaler and inhale daily. 07/05/13  Yes Pollie Friar, MD  zolpidem (AMBIEN) 5 MG tablet Take 1 tablet (5 mg total) by mouth at bedtime as needed for sleep. 03/09/14  Yes Tasrif Ahmed, MD  guaiFENesin (MUCINEX) 600 MG 12 hr tablet Take 1 tablet (600 mg total) by mouth 2 (two) times daily. 04/28/14   Tanna Furry, MD  levofloxacin (LEVAQUIN) 500 MG tablet Take 1 tablet (500 mg total) by mouth daily. Patient  not taking: Reported on 05/10/2014 04/28/14   Tanna Furry, MD  predniSONE (DELTASONE) 20 MG tablet Take 1 tablet (20 mg total) by mouth daily with breakfast. Patient not taking: Reported on 05/10/2014 04/28/14   Tanna Furry, MD   BP 106/66 mmHg  Pulse 138  Temp(Src) 99.5 F (37.5 C) (Oral)  Resp 16  SpO2 96% Physical Exam  Constitutional: She is oriented to person, place, and time. She appears well-developed and well-nourished. No distress.  HENT:  Head: Normocephalic and atraumatic.  Mouth/Throat: Oropharynx is clear and moist.  Eyes: Conjunctivae and EOM are normal. Pupils are equal, round, and reactive to light.  Neck: Normal range of motion.  Cardiovascular: Regular rhythm and normal heart sounds.   No murmur heard. Irregular heart rate tachycardic  Pulmonary/Chest: She is  in respiratory distress. She has wheezes.  Abdominal: Soft. Bowel sounds are normal. There is no tenderness.  Musculoskeletal: Normal range of motion.  Neurological: She is alert and oriented to person, place, and time. No cranial nerve deficit. She exhibits normal muscle tone. Coordination normal.  Skin: Skin is warm. No erythema.  Nursing note and vitals reviewed.   ED Course  Procedures (including critical care time) Labs Review Labs Reviewed  CBC WITH DIFFERENTIAL/PLATELET - Abnormal; Notable for the following:    Neutrophils Relative % 78 (*)    Lymphocytes Relative 11 (*)    All other components within normal limits  COMPREHENSIVE METABOLIC PANEL - Abnormal; Notable for the following:    Potassium 3.4 (*)    Glucose, Bld 115 (*)    Albumin 3.0 (*)    GFR calc non Af Amer 66 (*)    GFR calc Af Amer 76 (*)    All other components within normal limits  URINALYSIS, ROUTINE W REFLEX MICROSCOPIC - Abnormal; Notable for the following:    APPearance CLOUDY (*)    All other components within normal limits  BRAIN NATRIURETIC PEPTIDE - Abnormal; Notable for the following:    B Natriuretic Peptide 195.9 (*)     All other components within normal limits  CULTURE, BLOOD (ROUTINE X 2)  CULTURE, BLOOD (ROUTINE X 2)  URINE CULTURE  I-STAT CG4 LACTIC ACID, ED   Results for orders placed or performed during the hospital encounter of 05/10/14  CBC WITH DIFFERENTIAL  Result Value Ref Range   WBC 6.2 4.0 - 10.5 K/uL   RBC 4.19 3.87 - 5.11 MIL/uL   Hemoglobin 13.5 12.0 - 15.0 g/dL   HCT 41.1 36.0 - 46.0 %   MCV 98.1 78.0 - 100.0 fL   MCH 32.2 26.0 - 34.0 pg   MCHC 32.8 30.0 - 36.0 g/dL   RDW 15.2 11.5 - 15.5 %   Platelets 188 150 - 400 K/uL   Neutrophils Relative % 78 (H) 43 - 77 %   Neutro Abs 4.8 1.7 - 7.7 K/uL   Lymphocytes Relative 11 (L) 12 - 46 %   Lymphs Abs 0.7 0.7 - 4.0 K/uL   Monocytes Relative 11 3 - 12 %   Monocytes Absolute 0.7 0.1 - 1.0 K/uL   Eosinophils Relative 0 0 - 5 %   Eosinophils Absolute 0.0 0.0 - 0.7 K/uL   Basophils Relative 0 0 - 1 %   Basophils Absolute 0.0 0.0 - 0.1 K/uL  Comprehensive metabolic panel  Result Value Ref Range   Sodium 139 135 - 145 mmol/L   Potassium 3.4 (L) 3.5 - 5.1 mmol/L   Chloride 98 96 - 112 mmol/L   CO2 31 19 - 32 mmol/L   Glucose, Bld 115 (H) 70 - 99 mg/dL   BUN 9 6 - 23 mg/dL   Creatinine, Ser 0.89 0.50 - 1.10 mg/dL   Calcium 8.4 8.4 - 10.5 mg/dL   Total Protein 6.2 6.0 - 8.3 g/dL   Albumin 3.0 (L) 3.5 - 5.2 g/dL   AST 18 0 - 37 U/L   ALT 15 0 - 35 U/L   Alkaline Phosphatase 101 39 - 117 U/L   Total Bilirubin 0.6 0.3 - 1.2 mg/dL   GFR calc non Af Amer 66 (L) >90 mL/min   GFR calc Af Amer 76 (L) >90 mL/min   Anion gap 10 5 - 15  Urinalysis with microscopic  Result Value Ref Range   Color, Urine YELLOW  YELLOW   APPearance CLOUDY (A) CLEAR   Specific Gravity, Urine 1.014 1.005 - 1.030   pH 7.5 5.0 - 8.0   Glucose, UA NEGATIVE NEGATIVE mg/dL   Hgb urine dipstick NEGATIVE NEGATIVE   Bilirubin Urine NEGATIVE NEGATIVE   Ketones, ur NEGATIVE NEGATIVE mg/dL   Protein, ur NEGATIVE NEGATIVE mg/dL   Urobilinogen, UA 1.0 0.0 - 1.0  mg/dL   Nitrite NEGATIVE NEGATIVE   Leukocytes, UA NEGATIVE NEGATIVE  Brain natriuretic peptide  Result Value Ref Range   B Natriuretic Peptide 195.9 (H) 0.0 - 100.0 pg/mL  I-Stat CG4 Lactic Acid, ED  Result Value Ref Range   Lactic Acid, Venous 1.27 0.5 - 2.0 mmol/L     Imaging Review Dg Chest Port 1 View  05/10/2014   CLINICAL DATA:  Cough and shortness of breath for 2-3 weeks.  EXAM: PORTABLE CHEST - 1 VIEW  COMPARISON:  Frontal and lateral views 04/28/14  FINDINGS: Lower lung volumes leading to crowding of bronchovascular structures. There is elevation of right hemidiaphragm. Question of developing ill-defined right suprahilar opacity. The cardiomediastinal contours are unchanged allowing for differences in technique, heart at the upper limits of normal. No large pleural effusion or pneumothorax. No acute osseous abnormalities are seen.  IMPRESSION: Lower lung volumes from prior exam, with question of developing ill-defined suprahilar opacity, may reflect pneumonia in the appropriate clinical setting.   Electronically Signed   By: Jeb Levering M.D.   On: 05/10/2014 20:18     EKG Interpretation None     ED ECG REPORT   Date: 05/10/2014  Rate: 147  Rhythm: atrial fibrillation  QRS Axis: normal  Intervals: QT prolonged  ST/T Wave abnormalities: nonspecific ST/T changes  Conduction Disutrbances:none  Narrative Interpretation:   Old EKG Reviewed: none available  I have personally reviewed the EKG tracing and agree with the computerized printout as noted. EKG will not cross over into Muse.  CRITICAL CARE Performed by: Fredia Sorrow Total critical care time: 30 Critical care time was exclusive of separately billable procedures and treating other patients. Critical care was necessary to treat or prevent imminent or life-threatening deterioration. Critical care was time spent personally by me on the following activities: development of treatment plan with patient and/or  surrogate as well as nursing, discussions with consultants, evaluation of patient's response to treatment, examination of patient, obtaining history from patient or surrogate, ordering and performing treatments and interventions, ordering and review of laboratory studies, ordering and review of radiographic studies, pulse oximetry and re-evaluation of patient's condition.   MDM   Final diagnoses:  SOB (shortness of breath)  Chronic obstructive pulmonary disease with acute exacerbation  CAP (community acquired pneumonia)  Atrial fibrillation with RVR    Patient brought in by EMS. Patient seen in the emergency department not admitted 10 days ago. Patient had EMS involved for acute shortness of breath. Noted to have a fever at home. EMS gave albuterol and Atrovent 125 mg Solu-Medrol. Patient known to have a history of atrial fib COPD and CHF. Patient's always on at least 2 L of nasal cannula oxygen.  Patient with diffuse wheezing. Patient given 2 nebulizer treatments here with gradual improvement but no resolution of the wheezing.  A chest x-ray raises concerns for possible developing pneumonia in the face of the fevers. Patient had a temp spike care to 103.6. Patient was cultured lactic acid is not elevated however will be treated for a community-acquired pneumonia. Antibiotics started for that. Patient has an allergy to erythromycin so this  Zithromax not used doxycycline substituted.  Patient also has atrial fib is been a rapid rate atrial fib.  diltiazem was not given in the initial part of the workup, was not clear if is going to be a septic picture and whether additional fluids be required. Patient is not showing evidence of congestive heart failure.  Patient followed by internal medicine outpatient clinics however they claimed that they had released her from her their clinic today they did see her today. Of contacted [facility] hospitalist for admission of they are going to discuss it with  outpatient clinics to see you should admit. They think that maybe 1 to    Fredia Sorrow, MD 05/11/14 [phone]

## 2014-05-10 NOTE — ED Notes (Signed)
Per EMS, pt was discharged from the hospital 10 days ago. Pt had been admitted for a bacterial infection, unsure of where the infection was. Pt finished her course of antibiotics today, but states that she had no startged feeling any better. Upon EMS arrival to the pt's home, pt was tachypnic with a strong productive cough. Pt with a temperature of 103 orally, but no tylenol was administered. Pt given 5mg  albuterol and 05 of atrovent and 125 mg of solumedrol. Pt with hx of a-fib, COPD and CHF. Pt sats 88% on RA.

## 2014-05-11 ENCOUNTER — Inpatient Hospital Stay (HOSPITAL_COMMUNITY): Payer: Medicare Other

## 2014-05-11 ENCOUNTER — Other Ambulatory Visit: Payer: Self-pay

## 2014-05-11 DIAGNOSIS — I340 Nonrheumatic mitral (valve) insufficiency: Secondary | ICD-10-CM | POA: Diagnosis present

## 2014-05-11 DIAGNOSIS — I5030 Unspecified diastolic (congestive) heart failure: Secondary | ICD-10-CM

## 2014-05-11 DIAGNOSIS — Z79899 Other long term (current) drug therapy: Secondary | ICD-10-CM | POA: Diagnosis not present

## 2014-05-11 DIAGNOSIS — K219 Gastro-esophageal reflux disease without esophagitis: Secondary | ICD-10-CM | POA: Diagnosis present

## 2014-05-11 DIAGNOSIS — M1990 Unspecified osteoarthritis, unspecified site: Secondary | ICD-10-CM

## 2014-05-11 DIAGNOSIS — F17210 Nicotine dependence, cigarettes, uncomplicated: Secondary | ICD-10-CM | POA: Diagnosis present

## 2014-05-11 DIAGNOSIS — F419 Anxiety disorder, unspecified: Secondary | ICD-10-CM | POA: Diagnosis present

## 2014-05-11 DIAGNOSIS — E872 Acidosis: Secondary | ICD-10-CM | POA: Diagnosis present

## 2014-05-11 DIAGNOSIS — G8929 Other chronic pain: Secondary | ICD-10-CM | POA: Diagnosis present

## 2014-05-11 DIAGNOSIS — J09X2 Influenza due to identified novel influenza A virus with other respiratory manifestations: Secondary | ICD-10-CM | POA: Diagnosis not present

## 2014-05-11 DIAGNOSIS — F42 Obsessive-compulsive disorder: Secondary | ICD-10-CM | POA: Diagnosis present

## 2014-05-11 DIAGNOSIS — Z7982 Long term (current) use of aspirin: Secondary | ICD-10-CM | POA: Diagnosis not present

## 2014-05-11 DIAGNOSIS — R0602 Shortness of breath: Secondary | ICD-10-CM | POA: Diagnosis present

## 2014-05-11 DIAGNOSIS — J9622 Acute and chronic respiratory failure with hypercapnia: Secondary | ICD-10-CM | POA: Diagnosis not present

## 2014-05-11 DIAGNOSIS — A419 Sepsis, unspecified organism: Secondary | ICD-10-CM | POA: Diagnosis not present

## 2014-05-11 DIAGNOSIS — R042 Hemoptysis: Secondary | ICD-10-CM | POA: Diagnosis present

## 2014-05-11 DIAGNOSIS — E785 Hyperlipidemia, unspecified: Secondary | ICD-10-CM

## 2014-05-11 DIAGNOSIS — Z7951 Long term (current) use of inhaled steroids: Secondary | ICD-10-CM | POA: Diagnosis not present

## 2014-05-11 DIAGNOSIS — Z79891 Long term (current) use of opiate analgesic: Secondary | ICD-10-CM | POA: Diagnosis not present

## 2014-05-11 DIAGNOSIS — J1108 Influenza due to unidentified influenza virus with specified pneumonia: Secondary | ICD-10-CM | POA: Diagnosis not present

## 2014-05-11 DIAGNOSIS — D61818 Other pancytopenia: Secondary | ICD-10-CM | POA: Diagnosis present

## 2014-05-11 DIAGNOSIS — E875 Hyperkalemia: Secondary | ICD-10-CM | POA: Diagnosis not present

## 2014-05-11 DIAGNOSIS — J449 Chronic obstructive pulmonary disease, unspecified: Secondary | ICD-10-CM | POA: Diagnosis not present

## 2014-05-11 DIAGNOSIS — I4891 Unspecified atrial fibrillation: Secondary | ICD-10-CM

## 2014-05-11 DIAGNOSIS — Z9981 Dependence on supplemental oxygen: Secondary | ICD-10-CM | POA: Diagnosis not present

## 2014-05-11 DIAGNOSIS — J189 Pneumonia, unspecified organism: Secondary | ICD-10-CM | POA: Diagnosis not present

## 2014-05-11 DIAGNOSIS — J431 Panlobular emphysema: Secondary | ICD-10-CM | POA: Diagnosis not present

## 2014-05-11 DIAGNOSIS — I5032 Chronic diastolic (congestive) heart failure: Secondary | ICD-10-CM | POA: Diagnosis present

## 2014-05-11 DIAGNOSIS — Y95 Nosocomial condition: Secondary | ICD-10-CM | POA: Diagnosis present

## 2014-05-11 DIAGNOSIS — J1000 Influenza due to other identified influenza virus with unspecified type of pneumonia: Secondary | ICD-10-CM | POA: Diagnosis not present

## 2014-05-11 DIAGNOSIS — J441 Chronic obstructive pulmonary disease with (acute) exacerbation: Secondary | ICD-10-CM | POA: Diagnosis present

## 2014-05-11 DIAGNOSIS — J101 Influenza due to other identified influenza virus with other respiratory manifestations: Secondary | ICD-10-CM | POA: Diagnosis present

## 2014-05-11 DIAGNOSIS — F605 Obsessive-compulsive personality disorder: Secondary | ICD-10-CM

## 2014-05-11 DIAGNOSIS — I429 Cardiomyopathy, unspecified: Secondary | ICD-10-CM | POA: Diagnosis present

## 2014-05-11 DIAGNOSIS — J9620 Acute and chronic respiratory failure, unspecified whether with hypoxia or hypercapnia: Secondary | ICD-10-CM | POA: Diagnosis not present

## 2014-05-11 DIAGNOSIS — I10 Essential (primary) hypertension: Secondary | ICD-10-CM

## 2014-05-11 DIAGNOSIS — M25511 Pain in right shoulder: Secondary | ICD-10-CM | POA: Diagnosis present

## 2014-05-11 DIAGNOSIS — J9621 Acute and chronic respiratory failure with hypoxia: Secondary | ICD-10-CM | POA: Diagnosis not present

## 2014-05-11 DIAGNOSIS — E876 Hypokalemia: Secondary | ICD-10-CM | POA: Diagnosis present

## 2014-05-11 DIAGNOSIS — I951 Orthostatic hypotension: Secondary | ICD-10-CM | POA: Diagnosis not present

## 2014-05-11 DIAGNOSIS — J9602 Acute respiratory failure with hypercapnia: Secondary | ICD-10-CM | POA: Diagnosis not present

## 2014-05-11 LAB — CBC
HCT: 41.2 % (ref 36.0–46.0)
HEMOGLOBIN: 13.1 g/dL (ref 12.0–15.0)
MCH: 31.3 pg (ref 26.0–34.0)
MCHC: 31.8 g/dL (ref 30.0–36.0)
MCV: 98.3 fL (ref 78.0–100.0)
Platelets: 228 10*3/uL (ref 150–400)
RBC: 4.19 MIL/uL (ref 3.87–5.11)
RDW: 15.4 % (ref 11.5–15.5)
WBC: 6.4 10*3/uL (ref 4.0–10.5)

## 2014-05-11 LAB — TSH: TSH: 0.293 u[IU]/mL — AB (ref 0.350–4.500)

## 2014-05-11 LAB — HEPARIN LEVEL (UNFRACTIONATED)
Heparin Unfractionated: 0.76 IU/mL — ABNORMAL HIGH (ref 0.30–0.70)
Heparin Unfractionated: 0.68 IU/mL (ref 0.30–0.70)

## 2014-05-11 LAB — MRSA PCR SCREENING: MRSA by PCR: NEGATIVE

## 2014-05-11 LAB — BASIC METABOLIC PANEL
Anion gap: 9 (ref 5–15)
BUN: 11 mg/dL (ref 6–23)
CHLORIDE: 99 mmol/L (ref 96–112)
CO2: 31 mmol/L (ref 19–32)
Calcium: 8.2 mg/dL — ABNORMAL LOW (ref 8.4–10.5)
Creatinine, Ser: 0.82 mg/dL (ref 0.50–1.10)
GFR, EST AFRICAN AMERICAN: 84 mL/min — AB (ref >90)
GFR, EST NON AFRICAN AMERICAN: 72 mL/min — AB (ref >90)
Glucose, Bld: 171 mg/dL — ABNORMAL HIGH (ref 70–99)
POTASSIUM: 3.6 mmol/L (ref 3.5–5.1)
Sodium: 139 mmol/L (ref 135–145)

## 2014-05-11 LAB — STREP PNEUMONIAE URINARY ANTIGEN: STREP PNEUMO URINARY ANTIGEN: NEGATIVE

## 2014-05-11 LAB — INFLUENZA PANEL BY PCR (TYPE A & B)
H1N1 flu by pcr: NOT DETECTED
INFLAPCR: POSITIVE — AB
Influenza B By PCR: NEGATIVE

## 2014-05-11 LAB — HIV ANTIBODY (ROUTINE TESTING W REFLEX): HIV SCREEN 4TH GENERATION: NONREACTIVE

## 2014-05-11 MED ORDER — HEPARIN (PORCINE) IN NACL 100-0.45 UNIT/ML-% IJ SOLN
1050.0000 [IU]/h | INTRAMUSCULAR | Status: DC
  Administered 2014-05-11: 1150 [IU]/h via INTRAVENOUS
  Administered 2014-05-12: 1050 [IU]/h via INTRAVENOUS
  Filled 2014-05-11 (×5): qty 250

## 2014-05-11 MED ORDER — FLUOXETINE HCL 20 MG PO CAPS
60.0000 mg | ORAL_CAPSULE | Freq: Every day | ORAL | Status: DC
  Administered 2014-05-11 – 2014-05-22 (×12): 60 mg via ORAL
  Filled 2014-05-11 (×14): qty 3

## 2014-05-11 MED ORDER — DILTIAZEM HCL ER BEADS 300 MG PO CP24
420.0000 mg | ORAL_CAPSULE | Freq: Every day | ORAL | Status: DC
  Administered 2014-05-11 – 2014-05-22 (×12): 420 mg via ORAL
  Filled 2014-05-11 (×14): qty 1

## 2014-05-11 MED ORDER — FUROSEMIDE 80 MG PO TABS
80.0000 mg | ORAL_TABLET | Freq: Two times a day (BID) | ORAL | Status: DC
  Administered 2014-05-11 – 2014-05-12 (×2): 80 mg via ORAL
  Filled 2014-05-11: qty 1
  Filled 2014-05-11: qty 4
  Filled 2014-05-11 (×3): qty 1

## 2014-05-11 MED ORDER — DILTIAZEM HCL 100 MG IV SOLR
5.0000 mg/h | INTRAVENOUS | Status: DC
  Administered 2014-05-11: 5 mg/h via INTRAVENOUS
  Administered 2014-05-11: 10 mg/h via INTRAVENOUS

## 2014-05-11 MED ORDER — DIAZEPAM 2 MG PO TABS
2.0000 mg | ORAL_TABLET | Freq: Two times a day (BID) | ORAL | Status: DC | PRN
  Administered 2014-05-11 – 2014-05-17 (×9): 2 mg via ORAL
  Filled 2014-05-11 (×11): qty 1

## 2014-05-11 MED ORDER — POTASSIUM CHLORIDE CRYS ER 20 MEQ PO TBCR
40.0000 meq | EXTENDED_RELEASE_TABLET | Freq: Once | ORAL | Status: AC
  Administered 2014-05-11: 40 meq via ORAL
  Filled 2014-05-11: qty 2

## 2014-05-11 MED ORDER — ASPIRIN EC 81 MG PO TBEC
81.0000 mg | DELAYED_RELEASE_TABLET | Freq: Every day | ORAL | Status: DC
Start: 2014-05-11 — End: 2014-05-12
  Administered 2014-05-11 – 2014-05-12 (×2): 81 mg via ORAL
  Filled 2014-05-11 (×2): qty 1

## 2014-05-11 MED ORDER — HYDROCODONE-ACETAMINOPHEN 7.5-325 MG PO TABS
1.0000 | ORAL_TABLET | Freq: Four times a day (QID) | ORAL | Status: DC | PRN
  Administered 2014-05-11 – 2014-05-13 (×10): 1 via ORAL
  Filled 2014-05-11 (×10): qty 1

## 2014-05-11 MED ORDER — DEXTROSE 5 % IV SOLN
1.0000 g | INTRAVENOUS | Status: DC
  Filled 2014-05-11: qty 10

## 2014-05-11 MED ORDER — DOXYCYCLINE HYCLATE 100 MG PO TABS
100.0000 mg | ORAL_TABLET | Freq: Two times a day (BID) | ORAL | Status: DC
  Administered 2014-05-11: 100 mg via ORAL
  Filled 2014-05-11 (×2): qty 1

## 2014-05-11 MED ORDER — GUAIFENESIN ER 600 MG PO TB12
600.0000 mg | ORAL_TABLET | Freq: Two times a day (BID) | ORAL | Status: DC
  Administered 2014-05-14 – 2014-05-22 (×15): 600 mg via ORAL
  Filled 2014-05-11 (×25): qty 1

## 2014-05-11 MED ORDER — DILTIAZEM HCL 25 MG/5ML IV SOLN
10.0000 mg | Freq: Once | INTRAVENOUS | Status: AC
  Administered 2014-05-11: 10 mg via INTRAVENOUS
  Filled 2014-05-11: qty 5

## 2014-05-11 MED ORDER — BUDESONIDE-FORMOTEROL FUMARATE 160-4.5 MCG/ACT IN AERO
2.0000 | INHALATION_SPRAY | Freq: Two times a day (BID) | RESPIRATORY_TRACT | Status: DC
  Administered 2014-05-11 – 2014-05-13 (×4): 2 via RESPIRATORY_TRACT
  Filled 2014-05-11 (×2): qty 6

## 2014-05-11 MED ORDER — BUSPIRONE HCL 10 MG PO TABS
10.0000 mg | ORAL_TABLET | Freq: Three times a day (TID) | ORAL | Status: DC
  Administered 2014-05-11 – 2014-05-22 (×35): 10 mg via ORAL
  Filled 2014-05-11 (×36): qty 1

## 2014-05-11 MED ORDER — HEPARIN BOLUS VIA INFUSION
4000.0000 [IU] | Freq: Once | INTRAVENOUS | Status: AC
  Administered 2014-05-11: 4000 [IU] via INTRAVENOUS
  Filled 2014-05-11: qty 4000

## 2014-05-11 MED ORDER — HYDROCODONE-HOMATROPINE 5-1.5 MG/5ML PO SYRP
5.0000 mL | ORAL_SOLUTION | Freq: Four times a day (QID) | ORAL | Status: DC | PRN
  Administered 2014-05-11 – 2014-05-13 (×9): 5 mL via ORAL
  Filled 2014-05-11 (×9): qty 5

## 2014-05-11 MED ORDER — IBUPROFEN 200 MG PO TABS
400.0000 mg | ORAL_TABLET | Freq: Once | ORAL | Status: AC
  Administered 2014-05-11: 400 mg via ORAL
  Filled 2014-05-11: qty 2

## 2014-05-11 MED ORDER — OSELTAMIVIR PHOSPHATE 75 MG PO CAPS
75.0000 mg | ORAL_CAPSULE | Freq: Two times a day (BID) | ORAL | Status: AC
  Administered 2014-05-11 – 2014-05-15 (×10): 75 mg via ORAL
  Filled 2014-05-11 (×11): qty 1

## 2014-05-11 MED ORDER — PANTOPRAZOLE SODIUM 40 MG PO TBEC
40.0000 mg | DELAYED_RELEASE_TABLET | Freq: Every day | ORAL | Status: DC
  Administered 2014-05-11 – 2014-05-22 (×13): 40 mg via ORAL
  Filled 2014-05-11 (×13): qty 1

## 2014-05-11 MED ORDER — CETYLPYRIDINIUM CHLORIDE 0.05 % MT LIQD
7.0000 mL | Freq: Two times a day (BID) | OROMUCOSAL | Status: DC
  Administered 2014-05-11 – 2014-05-22 (×18): 7 mL via OROMUCOSAL

## 2014-05-11 NOTE — Progress Notes (Signed)
MD made aware of BP in Mid 70,s after medication given orally for AF, order to stop the Cardizem and not to give lasix

## 2014-05-11 NOTE — Progress Notes (Signed)
PT Cancellation Note  Patient Details Name: Kelsey Garcia MRN: [id number] DOB: [birth year]   Cancelled Treatment:    Reason Eval/Treat Not Completed: Fatigue limiting ability to participate. Spoke with pt regarding rt shoulder pain. She is seen by OPPT and has home ex program for rt shoulder. Will see her acutely to assess mobility and for dc planning. Recommended to pt that she follow up with OPPT after dc and also to continue home ex program as she is able to. Will attempt eval again tomorrow.   Kyley Solow 05/11/2014, 3:54 PM  [facility] PT [phone]

## 2014-05-11 NOTE — Progress Notes (Signed)
ANTICOAGULATION CONSULT NOTE - Initial Consult  Pharmacy Consult for Heparin Indication: atrial fibrillation  Allergies  Allergen Reactions  . Ace Inhibitors Cough  . Diphenhydramine Hcl Other (See Comments)    "feels like my skin is crawling, and legs twitches"  . Erythromycin Diarrhea  . Nsaids Nausea And Vomiting  . Nyquil [Pseudoeph-Doxylamine-Dm-Apap] Itching  . Tramadol Hcl Other (See Comments)    stomach pain, hallucinations  . Flexeril [Cyclobenzaprine] Anxiety  . Pseudoephedrine Palpitations    Patient Measurements: Height: 5\' 5"  (165.1 cm) Weight: 200 lb 2.8 oz (90.8 kg) IBW/kg (Calculated) : 57 Heparin Dosing Weight: 77 kg  Vital Signs: Temp: 99.5 F (37.5 C) (03/31 2314) Temp Source: Oral (03/31 2314) BP: 114/52 mmHg (04/01 0000) Pulse Rate: 126 (04/01 0000)  Labs:  Recent Labs  05/10/14 2005  HGB 13.5  HCT 41.1  PLT 188  CREATININE 0.89    Estimated Creatinine Clearance: 68.3 mL/min (by C-G formula based on Cr of 0.89).   Medical History: Past Medical History  Diagnosis Date  . Atrial fibrillation   . Hypertension   . Insomnia   . Nonischemic cardiomyopathy   . Hyperlipidemia   . Anxiety   . Cholelithiasis   . Insomnia   . Mitral regurgitation     noted 2010  . H/O epistaxis   . Rhinitis, allergic   . CHF (congestive heart failure)   . Pneumonia     "several times w/exacerbations of the COPD; nothing in the last year" (07/15/2012)  . COPD (chronic obstructive pulmonary disease)     as of 7/13 on 2-3L, pfts 10/2008 with mod obstruction  . Chronic bronchitis with COPD (chronic obstructive pulmonary disease)   . Shortness of breath     "all the time right now" (07/15/2012)  . GERD (gastroesophageal reflux disease)   . YYQMGNOI(370.4)     "weekly" (07/15/2012)  . Migraines     "weekly for awhile; cleared up as I got older" (07/15/2012)  . DJD (degenerative joint disease)   . Arthritis     "all over" (07/15/2012)  . OCD (obsessive compulsive  disorder)   . OCD (obsessive compulsive disorder)   . Depression     h/o SI; "last time I was really serious about it was ~ 1997" (07/15/2012)    Medications:   (Not in a hospital admission) Scheduled:  . ipratropium-albuterol  3 mL Nebulization Q4H  . potassium chloride  40 mEq Oral Once   Infusions:  . sodium chloride 100 mL/hr at 05/10/14 2145  . diltiazem      Assessment: 69yo female with history of Afib, HTN, NICM, anxiety, CHF and COPD presents with SOB and Afib w/ RVR. Pharmacy is consulted to dose heparin for atrial fibrillation. Pt is not on anticoagulation at home, CBC and sCr are wnl, BNP 196.  Goal of Therapy:  Heparin level 0.3-0.7 units/ml Monitor platelets by anticoagulation protocol: Yes   Plan:  Give 4000 units bolus x 1 Start heparin infusion at 1150 units/hr Check anti-Xa level in 6 hours and daily while on heparin Continue to monitor H&H and platelets  F/u on long-term anticoagulation  Andrey Cota. Diona Foley, PharmD Clinical Pharmacist Pager [phone] 05/11/2014,1:01 AM

## 2014-05-11 NOTE — Progress Notes (Signed)
Subjective: States that she has had SOB and wheezing that didn't really improve since she first presented to clinic for these symptoms. About 3-4 days ago, she got significantly worse and felt that she couldn't speak normally she was so short of breath. She also endorses sort throat, non-productive cough, and poor po intake.   Since admission, she feels that she is breathing shallowly, but is improved from prior to admission. She began having diarrhea this AM.   Objective: Vital signs in last 24 hours: Filed Vitals:   05/11/14 1000 05/11/14 1008 05/11/14 1100 05/11/14 1247  BP: 122/64  128/88   Pulse: 109  112 121  Temp:   98.5 F (36.9 C)   TempSrc:   Oral   Resp: 19  19 21   Height:   5\' 5"  (1.651 m)   Weight:  87.499 kg (192 lb 14.4 oz) 87.091 kg (192 lb)   SpO2: 95%  95% 92%   Weight change:   Intake/Output Summary (Last 24 hours) at 05/11/14 1328 Last data filed at 05/11/14 1300  Gross per 24 hour  Intake      0 ml  Output    600 ml  Net   -600 ml   BP 128/88 mmHg  Pulse 121  Temp(Src) 98.5 F (36.9 C) (Oral)  Resp 21  Ht 5\' 5"  (1.651 m)  Wt 87.091 kg (192 lb)  BMI 31.95 kg/m2  SpO2 92%  General: Sitting upright in bed in no acute distress.  Pulm: Difficult to auscultate; no clear wheezes or crackles heard, but poor air movement bilaterally.  Abdominal: Normal bowel sounds.   Lab Results: CBC    Component Value Date/Time   WBC 6.4 05/11/2014 0230   RBC 4.19 05/11/2014 0230   HGB 13.1 05/11/2014 0230   HCT 41.2 05/11/2014 0230   PLT 228 05/11/2014 0230   MCV 98.3 05/11/2014 0230   MCH 31.3 05/11/2014 0230   MCHC 31.8 05/11/2014 0230   RDW 15.4 05/11/2014 0230   LYMPHSABS 0.7 05/10/2014 2005   MONOABS 0.7 05/10/2014 2005   EOSABS 0.0 05/10/2014 2005   BASOSABS 0.0 05/10/2014 2005   BMET    Component Value Date/Time   NA 139 05/11/2014 0230   K 3.6 05/11/2014 0230   CL 99 05/11/2014 0230   CO2 31 05/11/2014 0230   GLUCOSE 171* 05/11/2014 0230   BUN 11 05/11/2014 0230   CREATININE 0.82 05/11/2014 0230   CREATININE 0.91 10/12/2013 1516   CALCIUM 8.2* 05/11/2014 0230   GFRNONAA 72* 05/11/2014 0230   GFRNONAA 66 10/12/2013 1516   GFRAA 84* 05/11/2014 0230   GFRAA 76 10/12/2013 1516   Micro Results: Reviewed in EPIC. Please see chart for full details.   Studies/Results: Dg Chest 2 View  05/11/2014   CLINICAL DATA:  Community-acquired pneumonia, healthcare acquired pneumonia. Fever.  EXAM: CHEST  2 VIEW  COMPARISON:  Portable AP view 1 day prior at 1958 hr  FINDINGS: Minimal patchy right suprahilar opacity, unchanged from recent exam. No new airspace opacity. There diffusely prominent bronchovascular markings, with improved lung aeration, can be seen with bronchial thickening. The cardiomediastinal contours are unchanged, heart at the upper limits of normal in size. No pleural effusion or pneumothorax. No acute osseous abnormalities are seen.  IMPRESSION: Minimal patchy right suprahilar opacity concerning for pneumonia, unchanged from prior exam. Prominence of bronchovascular markings, persist after improved lung aeration suggesting bronchial thickening.   Electronically Signed   By: Jeb Levering M.D.   On: 05/11/2014 02:17  Dg Chest Port 1 View  05/10/2014   CLINICAL DATA:  Cough and shortness of breath for 2-3 weeks.  EXAM: PORTABLE CHEST - 1 VIEW  COMPARISON:  Frontal and lateral views 04/28/14  FINDINGS: Lower lung volumes leading to crowding of bronchovascular structures. There is elevation of right hemidiaphragm. Question of developing ill-defined right suprahilar opacity. The cardiomediastinal contours are unchanged allowing for differences in technique, heart at the upper limits of normal. No large pleural effusion or pneumothorax. No acute osseous abnormalities are seen.  IMPRESSION: Lower lung volumes from prior exam, with question of developing ill-defined suprahilar opacity, may reflect pneumonia in the appropriate clinical  setting.   Electronically Signed   By: Jeb Levering M.D.   On: 05/10/2014 20:18   Medications:  Scheduled Meds: . aspirin EC  81 mg Oral Daily  . budesonide-formoterol  2 puff Inhalation BID  . busPIRone  10 mg Oral TID  . cefTRIAXone (ROCEPHIN)  IV  1 g Intravenous Q24H  . doxycycline  100 mg Oral Q12H  . FLUoxetine  60 mg Oral Daily  . furosemide  80 mg Oral BID  . guaiFENesin  600 mg Oral BID  . ipratropium-albuterol  3 mL Nebulization Q4H  . oseltamivir  75 mg Oral BID  . pantoprazole  40 mg Oral Daily   Continuous Infusions: . sodium chloride 100 mL/hr at 05/10/14 2145  . diltiazem (CARDIZEM) infusion 10 mg/hr (05/11/14 1300)  . heparin 10.5 Units (05/11/14 1300)   PRN Meds:.diazepam, HYDROcodone-acetaminophen, HYDROcodone-homatropine   Assessment/Plan: Mrs Dingwall is a 69 yo female with a PMH significant for gold stage III COPD, atrial fibrillation, HFpEF, OCD, GERD, osteoarthritis, HTN and hyperlipidemia who was admitted for SOB, wheezing, tachycardia, fevers, and atrial fibrillation with RVR.   Principal Problem:   HCAP (healthcare-associated pneumonia) Active Problems:   HLD (hyperlipidemia)   Atrial fibrillation with rapid ventricular response   COPD (chronic obstructive pulmonary disease)   Osteoarthritis   Long term current use of opiate analgesic   CAP (community acquired pneumonia)  ** SOB, wheezing, tachycardia, fevers:  Febrile and tachycardic on admission with symptoms of SOB, wheezing, non-productive cough, increased oxygen requirement. There was initial concern for possible CAP with a CXR showing ill-defined supra-hilar opacity. She was found to be influenza A positive. This is the most likely etiology for her worsened symptoms from her baseline COPD given her relatively high fever and associated diarrhea developing this morning; however a superimposed bacterial PNA is not excluded.  -- Influenza A positive, will start tamiflu today --  Will d/c IV  ceftriaxone and doxycycline  -- HIV antibody, legionella urinary antigen, sputum culture and gram stain, and blood culture pending  -- Strep pneumoniae urinary antigen negative -- Symptomatic treatment with hycodan cough syrup, mucinex -- Titrate oxygen to maintain sat >92%  ** Atrial fibrillation with RVR:  Tachycardic to 140s on admission in the setting of missing home diltiazem, now continues to be tachycardic in 100s-120s. CHADS-VASC score is 4; though has refused anticoagulation in the past. Will discuss future anticoagulation with patient tomorrow.  -- Monitor on telemetry -- Continue heparin gtt per pharmacy -- Will re-start home diltiazem (tiazac 24 hr capsule) 420 mg daily  -- Will wean diltiazem drip  -- Repeat ECG scheduled for 4/2 -- TSH is low at 0.293; will send T4 -- Hemoglobin A1c pending  ** COPD: GOLD stage III.  -- Continue Symbicort 2 puffs twice daily -- Continue DuoNebs every 4 hours  ** HFpEF: EF 50-55% with  elevated pulmonary arterial pressure 32 mmHg on echo 03/07/2014 though presumed to have diastolic dysfunction as she has had symptoms of dyspnea in the past in the setting of elevated BNP and physical exam findings, like crackles, lower extremity edema. BNP is 195.9 on admission, only previous result available is 209.4 from 2 weeks ago. Difficult pulmonary exam, but does not appear volume overloaded at this time.  -- Continue Aspirin 81 mg -- Continue Lasix 80 mg twice daily  ** Hypertension: Appears well controlled.  -- Diltiazem and Lasix as noted above.  ** Hyperlipidemia: Lipid panel 10/12/2013 notable for total cholesterol 194, HDL 52, LDL 127, triglycerides 76. She is not on any statin therapy. -- Defer starting statin therapy as outpatient  ** Osteoarthritis: She last saw Dr. [facility] medicine] on 02/05/14 for neck pain. C-spine x-ray 01/31/14 showed multilevel degenerative changes including bilateral facet arthropathy at C3/C4, C4/C5, C5/C6, C6/C7.  She also follows with outpatient physical therapy, last seen 05/08/14. At her office visit 04/16/14, she reported improved functional status on Norco 7/325 mg but then requested she receive increased doses for mood symptoms and became verbally abusive towards providers when they attempted to explain to her that mood symptoms were not an indication for a change in her dosage. Consequently, it was decided that she was to be fired from the clinic at her most recent clinic visit yesterday. -- Continue Norco 7.5/325 mg every 6 hours as needed for pain -- Provide her with resources to establish PCP care elsewhere  ** OCD: She follows with a therapist at [facility]. -- Continue Buspar 10 mg 3 times daily -- Continue Valium 2 mg every 12 hours as needed for anxiety -- Continue Prozac 60 mg  ** History of thrush: As noted on her office visit 3/7. Current sore throat is more likely related to influenza infection.  -- Continue assessing  **FEN/GI: -- Diet: heart healthy/carb-modified   ** PPx:  -- DVT: on heparin gtt for A-fib with RVR -- On home protonix 40 mg   ** Dispo:  -- Full code -- Will need resources to establish PCP prior to discharge -- Will need cardiology follow-up prior to discharge   This is a Careers information officer Note.  The care of the patient was discussed with Dr. Gordy Levan and the assessment and plan formulated with their assistance.  Please see their attached note for official documentation of the daily encounter.   LOS: 0 days   Dierdre Forth, Med Student 05/11/2014, 1:28 PM  [phone]

## 2014-05-11 NOTE — Progress Notes (Addendum)
ANTICOAGULATION CONSULT NOTE - Follow-up Consult  Pharmacy Consult for Heparin Indication: atrial fibrillation  Allergies  Allergen Reactions  . Ace Inhibitors Cough  . Diphenhydramine Hcl Other (See Comments)    "feels like my skin is crawling, and legs twitches"  . Erythromycin Diarrhea  . Nsaids Nausea And Vomiting  . Nyquil [Pseudoeph-Doxylamine-Dm-Apap] Itching  . Tramadol Hcl Other (See Comments)    stomach pain, hallucinations  . Flexeril [Cyclobenzaprine] Anxiety  . Pseudoephedrine Palpitations    Patient Measurements: Height: 5\' 5"  (165.1 cm) Weight: 200 lb 2.8 oz (90.8 kg) IBW/kg (Calculated) : 57 Heparin Dosing Weight: 77 kg  Vital Signs: Temp: 98 F (36.7 C) (04/01 0654) Temp Source: Oral (04/01 0654) BP: 113/59 mmHg (04/01 0800) Pulse Rate: 106 (04/01 0800)  Labs:  Recent Labs  05/10/14 2005 05/11/14 0230 05/11/14 0800  HGB 13.5 13.1  --   HCT 41.1 41.2  --   PLT 188 228  --   HEPARINUNFRC  --   --  0.76*  CREATININE 0.89 0.82  --     Estimated Creatinine Clearance: 74.1 mL/min (by C-G formula based on Cr of 0.82).   Assessment: 69yo female with history of Afib, HTN, NICM, anxiety, CHF and COPD presents with SOB and Afib w/ RVR. Pharmacy is consulted to dose heparin for atrial fibrillation. Heparin level 0.76 (slightly supratherapeutic) on 1150 units/hr. No bleeding noted.  Goal of Therapy:  Heparin level 0.3-0.7 units/ml Monitor platelets by anticoagulation protocol: Yes   Plan:  1. Decrease heparin gtt to 1050 units/hr 2. Will f/u 6 hr heparin level  Sherlon Handing, PharmD, BCPS Clinical pharmacist, pager [phone] 05/11/2014,8:49 AM   Addendum: Heparin level is now therapeutic at 0.68 after rate decrease this morning. Will continue heparin at 1050 units/hr and follow up AM labs.  Nena Jordan, PharmD, BCPS 05/11/2014, 6:58 PM

## 2014-05-11 NOTE — Progress Notes (Signed)
UR Completed.  [phone]  

## 2014-05-12 LAB — CBC
HCT: 35.7 % — ABNORMAL LOW (ref 36.0–46.0)
HEMATOCRIT: 38.3 % (ref 36.0–46.0)
Hemoglobin: 12.1 g/dL (ref 12.0–15.0)
Hemoglobin: 11.3 g/dL — ABNORMAL LOW (ref 12.0–15.0)
MCH: 31.4 pg (ref 26.0–34.0)
MCH: 31.4 pg (ref 26.0–34.0)
MCHC: 31.6 g/dL (ref 30.0–36.0)
MCHC: 31.7 g/dL (ref 30.0–36.0)
MCV: 99.5 fL (ref 78.0–100.0)
MCV: 99.2 fL (ref 78.0–100.0)
PLATELETS: 163 10*3/uL (ref 150–400)
Platelets: 169 10*3/uL (ref 150–400)
RBC: 3.85 MIL/uL — ABNORMAL LOW (ref 3.87–5.11)
RBC: 3.6 MIL/uL — ABNORMAL LOW (ref 3.87–5.11)
RDW: 15.6 % — ABNORMAL HIGH (ref 11.5–15.5)
RDW: 15.6 % — ABNORMAL HIGH (ref 11.5–15.5)
WBC: 6 10*3/uL (ref 4.0–10.5)
WBC: 3.6 10*3/uL — AB (ref 4.0–10.5)

## 2014-05-12 LAB — URINE CULTURE

## 2014-05-12 LAB — HEPARIN LEVEL (UNFRACTIONATED): Heparin Unfractionated: 0.42 IU/mL (ref 0.30–0.70)

## 2014-05-12 LAB — HEMOGLOBIN A1C
Hgb A1c MFr Bld: 5.7 % — ABNORMAL HIGH (ref 4.8–5.6)
Mean Plasma Glucose: 117 mg/dL

## 2014-05-12 LAB — T4, FREE: Free T4: 1.04 ng/dL (ref 0.80–1.80)

## 2014-05-12 MED ORDER — IPRATROPIUM-ALBUTEROL 0.5-2.5 (3) MG/3ML IN SOLN
3.0000 mL | RESPIRATORY_TRACT | Status: DC | PRN
  Administered 2014-05-12: 3 mL via RESPIRATORY_TRACT

## 2014-05-12 MED ORDER — LEVALBUTEROL HCL 0.63 MG/3ML IN NEBU
0.6300 mg | INHALATION_SOLUTION | RESPIRATORY_TRACT | Status: DC
  Administered 2014-05-12 – 2014-05-16 (×18): 0.63 mg via RESPIRATORY_TRACT
  Filled 2014-05-12 (×41): qty 3

## 2014-05-12 MED ORDER — IPRATROPIUM BROMIDE 0.02 % IN SOLN
0.5000 mg | RESPIRATORY_TRACT | Status: DC
  Administered 2014-05-12 – 2014-05-16 (×18): 0.5 mg via RESPIRATORY_TRACT
  Filled 2014-05-12 (×19): qty 2.5

## 2014-05-12 NOTE — Progress Notes (Signed)
Subjective: Overnight, patient became hypotensive to 70s/50s with diltiazem drip and re-starting home oral diltiazem. Nursing discontinued the drip and contacted the overnight team. Her pressures returned to 110s-140s/50s-70s but HR has continued to be tachycardic. Held evening dose of lasix.   Overnight, the team was paged for new onset shoulder pain as well as anxiety and SOB. Patient was given PRN Norco, which helped her to go to sleep.  She continues to be febrile to 38.3. She has been requiring 3 L O2 by [place] with sats ranging 86-100%. Baseline is 2L at home.   States that she is feeling a little better this morning. She is intermittently breathing ok, but sometimes feels SOB. Her diarrhea has improved this morning, but she reports some bleeding from her hemorrhoids. She was able to eat some dinner last night, but had a poor appetite.   She states that she is uncomfortable with the idea of anticoagulation because she is worrying about bleeding to death and wonders if the risk is worth it.   Objective: Vital signs in last 24 hours: Filed Vitals:   05/11/14 2356 05/12/14 0029 05/12/14 0412 05/12/14 0433  BP:  110/58 125/58   Pulse:  130 112   Temp:   99.5 F (37.5 C)   TempSrc:   Oral   Resp:  19 28   Height:      Weight:      SpO2: 96% 93% 94% 93%   Weight change: -3.301 kg (-7 lb 4.4 oz)  Intake/Output Summary (Last 24 hours) at 05/12/14 0713 Last data filed at 05/12/14 0600  Gross per 24 hour  Intake 4000.22 ml  Output   1550 ml  Net 2450.22 ml   General: Lying in bed in no acute distress. Alert, conversant. CV: Irregular rate and rhythm.  Pulm: Difficult auscultation; no wheezes or crackles auscultated Extremities: No lower extremity edema.   Lab Results: CBC    Component Value Date/Time   WBC 6.0 05/12/2014 0243   RBC 3.85* 05/12/2014 0243   HGB 12.1 05/12/2014 0243   HCT 38.3 05/12/2014 0243   PLT 169 05/12/2014 0243   MCV 99.5 05/12/2014 0243   MCH 31.4  05/12/2014 0243   MCHC 31.6 05/12/2014 0243   RDW 15.6* 05/12/2014 0243   LYMPHSABS 0.7 05/10/2014 2005   MONOABS 0.7 05/10/2014 2005   EOSABS 0.0 05/10/2014 2005   BASOSABS 0.0 05/10/2014 2005   Studies/Results: Dg Chest 2 View  05/11/2014   CLINICAL DATA:  Community-acquired pneumonia, healthcare acquired pneumonia. Fever.  EXAM: CHEST  2 VIEW  COMPARISON:  Portable AP view 1 day prior at 1958 hr  FINDINGS: Minimal patchy right suprahilar opacity, unchanged from recent exam. No new airspace opacity. There diffusely prominent bronchovascular markings, with improved lung aeration, can be seen with bronchial thickening. The cardiomediastinal contours are unchanged, heart at the upper limits of normal in size. No pleural effusion or pneumothorax. No acute osseous abnormalities are seen.  IMPRESSION: Minimal patchy right suprahilar opacity concerning for pneumonia, unchanged from prior exam. Prominence of bronchovascular markings, persist after improved lung aeration suggesting bronchial thickening.   Electronically Signed   By: Jeb Levering M.D.   On: 05/11/2014 02:17   Dg Chest Port 1 View  05/10/2014   CLINICAL DATA:  Cough and shortness of breath for 2-3 weeks.  EXAM: PORTABLE CHEST - 1 VIEW  COMPARISON:  Frontal and lateral views 04/28/14  FINDINGS: Lower lung volumes leading to crowding of bronchovascular structures. There is elevation of right  hemidiaphragm. Question of developing ill-defined right suprahilar opacity. The cardiomediastinal contours are unchanged allowing for differences in technique, heart at the upper limits of normal. No large pleural effusion or pneumothorax. No acute osseous abnormalities are seen.  IMPRESSION: Lower lung volumes from prior exam, with question of developing ill-defined suprahilar opacity, may reflect pneumonia in the appropriate clinical setting.   Electronically Signed   By: Jeb Levering M.D.   On: 05/10/2014 20:18   Medications:  Scheduled Meds: .  antiseptic oral rinse  7 mL Mouth Rinse BID  . aspirin EC  81 mg Oral Daily  . budesonide-formoterol  2 puff Inhalation BID  . busPIRone  10 mg Oral TID  . diltiazem  420 mg Oral Daily  . FLUoxetine  60 mg Oral Daily  . furosemide  80 mg Oral BID  . guaiFENesin  600 mg Oral BID  . ipratropium-albuterol  3 mL Nebulization Q4H  . oseltamivir  75 mg Oral BID  . pantoprazole  40 mg Oral Daily   Continuous Infusions: . sodium chloride 100 mL/hr at 05/12/14 0217  . diltiazem (CARDIZEM) infusion Stopped (05/11/14 1748)  . heparin 1,050 Units/hr (05/11/14 1900)   PRN Meds:.diazepam, HYDROcodone-acetaminophen, HYDROcodone-homatropine   Assessment/Plan: Kelsey Garcia is a 69 yo female with a PMH significant for gold stage III COPD, atrial fibrillation, HFpEF, OCD, GERD, osteoarthritis, HTN and hyperlipidemia who was admitted for SOB, wheezing, tachycardia, fevers, now found to have the flu and atrial fibrillation with RVR.   Principal Problem:   Influenza due to influenza A virus Active Problems:   HLD (hyperlipidemia)   Atrial fibrillation with rapid ventricular response   COPD (chronic obstructive pulmonary disease)   Osteoarthritis   Long term current use of opiate analgesic  ** SOB, wheezing, tachycardia, fevers: Febrile and tachycardic on admission with symptoms of SOB, wheezing, non-productive cough, increased oxygen requirement. There was initial concern for possible CAP with a CXR showing ill-defined supra-hilar opacity. She was found to be influenza A positive. This is the most likely etiology for her worsened symptoms from her baseline COPD given her relatively high fever and associated diarrhea. Ceftriaxone and doxycycline d/c'd on 4/1. -- Continue tamiflu -- HIV antibody negative, legionella urinary antigen pending, sputum culture and gram stain need to be collected, and blood culture pending  -- Strep pneumoniae urinary antigen negative -- Symptomatic treatment with hycodan cough  syrup, mucinex -- Titrate oxygen to maintain sat >92%  ** Atrial fibrillation with RVR: Tachycardic to 140s on admission in the setting of missing home diltiazem. Continues to be tachycardic and in a-fib despite diltiazem drip and re-starting home diltiazem. Now off diltiazem drip since 4/1. CHADS-VASC score is 4. Accepts heparin during hospitalization., but continues to decline outpatient anticoagulation other than her home aspirin. TSH mildly low at 0.293. T4 WNL at 1.04.  -- Monitor on telemetry -- ECG this AM continued to show a-fib with RVR -- Continue heparin gtt per pharmacy -- Now on home diltiazem (tiazac 24 hr capsule) 420 mg daily  -- Hemoglobin A1c 5.7, sightly elevated with no history of diabetes, will likely need follow- up as an outpatient  ** COPD: GOLD stage III.  -- Continue Symbicort 2 puffs twice daily -- Continue DuoNebs every 4 hours  ** HFpEF: EF 50-55% with elevated pulmonary arterial pressure 32 mmHg on echo 03/07/2014 though presumed to have diastolic dysfunction as she has had symptoms of dyspnea in the past in the setting of elevated BNP and physical exam findings, like crackles, lower  extremity edema. BNP is 195.9 on admission, only previous result available is 209.4 from 2 weeks ago. Difficult pulmonary exam, but does not appear volume overloaded at this time.  -- Continue Aspirin 81 mg -- Held PM lasix for hypotension overnight in the setting of gtt and po diltiazem. Will re-start home lasix today.   ** Hypertension: Appears well controlled.  -- Diltiazem and Lasix as noted above.  ** Hyperlipidemia: Lipid panel 10/12/2013 notable for total cholesterol 194, HDL 52, LDL 127, triglycerides 76. She is not on any statin therapy. -- Defer starting statin therapy as outpatient  ** Osteoarthritis: She last saw Dr. Sh[facility] medicine] on 02/05/14 for neck pain. C-spine x-ray 01/31/14 showed multilevel degenerative changes including bilateral facet arthropathy at  C3/C4, C4/C5, C5/C6, C6/C7. She also follows with outpatient physical therapy, last seen 05/08/14. At her office visit 04/16/14, she reported improved functional status on Norco 7/325 mg but then requested she receive increased doses for mood symptoms and became verbally abusive towards providers when they attempted to explain to her that mood symptoms were not an indication for a change in her dosage. Consequently, it was decided that she was to be fired from the clinic at her most recent clinic visit yesterday. -- Continue Norco 7.5/325 mg every 6 hours as needed for pain -- Provide her with resources to establish PCP care elsewhere -- Uses TENS unit at home? Will see if we can provide one here   ** OCD: She follows with a therapist at [facility]. -- Continue Buspar 10 mg 3 times daily -- Continue Valium 2 mg every 12 hours PRN for anxiety -- Continue Prozac 60 mg  **FEN/GI: -- Diet: heart healthy/carb-modified   ** PPx:  -- DVT: on heparin gtt for A-fib with RVR -- On home protonix 40 mg   ** Dispo:  -- Full code -- Will need resources to establish PCP prior to discharge -- Will need cardiology follow-up prior to discharge   This is a Careers information officer Note.  The care of the patient was discussed with Dr. Gordy Levan and the assessment and plan formulated with their assistance.  Please see their attached note for official documentation of the daily encounter.   LOS: 1 day   Kelsey Garcia, Med Student 05/12/2014, 7:13 AM  [phone]

## 2014-05-12 NOTE — Progress Notes (Signed)
Patient complained of difficulty breathing. Patient had more labored breathing than usual and was not able to complete sentences without being short of breath. Oxygen at 3L with O2 sat. 90-95%. HR remains a-fib and tachycardic 110-125 and BP 123/76. Patient very anxious. Called internal med, MD came to evaluate patient. Orders to give norco and hycodan early. Patient previously refused duoneb and valium but agreed with MD to take meds.

## 2014-05-12 NOTE — Progress Notes (Signed)
RT Note: Pt coughed up moderate-sized bloody clot after MDI.

## 2014-05-12 NOTE — Progress Notes (Addendum)
norco, hycodan, and duoneb given. Patient breathing improved and states that she feels much better. Patient resting. Will continue to montior

## 2014-05-12 NOTE — Evaluation (Signed)
Physical Therapy Evaluation Patient Details Name: Kelsey Garcia MRN: [id number] DOB: January 23, 1946 Today's Date: 05/12/2014   History of Present Illness  69 y.o. female, influenza A positive and Afib with RVR.  Clinical Impression  Pt admitted with the above diagnosis. Pt currently with functional limitations due to the deficits listed below (see PT Problem List). Modified independent with bed mobility and transfers. Limited by fatigue and rapid increase in HR to 120s upon sitting edge of bed with drop in SpO2 to 88% on 3L supplemental O2. Pt declines to sit up in chair. Encouraged to sit upright in chair as tolerated. Her daughter will provide 24 hour supervision at home as needed. Pt will benefit from skilled PT to increase their independence and safety with mobility to allow discharge to the venue listed below.       Follow Up Recommendations Outpatient PT (Continue with current OP program)    Equipment Recommendations  None recommended by PT    Recommendations for Other Services       Precautions / Restrictions Precautions Precautions: Fall Precaution Comments: Monitor HR and O2 sats Restrictions Weight Bearing Restrictions: No      Mobility  Bed Mobility Overal bed mobility: Modified Independent             General bed mobility comments: Use of rail  Transfers Overall transfer level: Modified independent               General transfer comment: Managed lines/leads on her own, stood and pivoted from bed to [facility] without physical assist. See vitals below for significant findings.  Ambulation/Gait                Stairs            Wheelchair Mobility    Modified Rankin (Stroke Patients Only)       Balance Overall balance assessment: Modified Independent                                           Pertinent Vitals/Pain Pain Assessment: 0-10 Pain Score: 10-Worst pain ever Pain Location: Rt shoulder Pain Descriptors / Indicators:  Aching Pain Intervention(s): Monitored during session;Repositioned    Home Living Family/patient expects to be discharged to:: Private residence Living Arrangements: Children Available Help at Discharge: Family;Available 24 hours/day Type of Home: Apartment Home Access: Stairs to enter Entrance Stairs-Rails: Left Entrance Stairs-Number of Steps: 10 Home Layout: Two level;Bed/bath upstairs Home Equipment: Walker - 2 wheels;Wheelchair - power;Shower seat      Prior Function Level of Independence: Independent with assistive device(s)         Comments: uses RW and power wheelchair "on bad days"     Hand Dominance   Dominant Hand: Right    Extremity/Trunk Assessment   Upper Extremity Assessment: Defer to OT evaluation           Lower Extremity Assessment: Generalized weakness         Communication   Communication: No difficulties  Cognition Arousal/Alertness: Awake/alert Behavior During Therapy: WFL for tasks assessed/performed Overall Cognitive Status: Within Functional Limits for tasks assessed                      General Comments General comments (skin integrity, edema, etc.): HR at rest 103, elevated to 120s upon sitting and during transfer to [facility]. SpO2 91% on 3L supplemental O2, drops  to 88% upon transfering. BP 104/61    Exercises        Assessment/Plan    PT Assessment Patient needs continued PT services  PT Diagnosis Difficulty walking;Generalized weakness;Acute pain   PT Problem List Decreased strength;Decreased activity tolerance;Decreased balance;Decreased mobility;Decreased knowledge of use of DME;Cardiopulmonary status limiting activity;Pain  PT Treatment Interventions DME instruction;Gait training;Stair training;Functional mobility training;Therapeutic activities;Therapeutic exercise;Balance training;Neuromuscular re-education;Patient/family education;Modalities   PT Goals (Current goals can be found in the Care Plan section) Acute  Rehab PT Goals Patient Stated Goal: Go home PT Goal Formulation: With patient Time For Goal Achievement: 05/26/14 Potential to Achieve Goals: Good    Frequency Min 3X/week   Barriers to discharge        Co-evaluation               End of Session Equipment Utilized During Treatment: Oxygen Activity Tolerance: Patient limited by fatigue;Treatment limited secondary to medical complications (Comment) (increased HR and drop in SpO2) Patient left: in bed;with call bell/phone within reach Nurse Communication: Mobility status;Other (comment) (Vitals during eval)         Time: 6767-2094 PT Time Calculation (min) (ACUTE ONLY): 10 min   Charges:   PT Evaluation $Initial PT Evaluation Tier I: 1 Procedure     PT G CodesEllouise Newer 05/12/2014, 9:07 AM Elayne Snare, [place]

## 2014-05-12 NOTE — Progress Notes (Signed)
ANTICOAGULATION CONSULT NOTE - Follow Up Consult  Pharmacy Consult for Heparin Indication: Afib  Allergies  Allergen Reactions  . Ace Inhibitors Cough  . Diphenhydramine Hcl Other (See Comments)    "feels like my skin is crawling, and legs twitches"  . Erythromycin Diarrhea  . Nsaids Nausea And Vomiting  . Nyquil [Pseudoeph-Doxylamine-Dm-Apap] Itching  . Tramadol Hcl Other (See Comments)    stomach pain, hallucinations  . Flexeril [Cyclobenzaprine] Anxiety  . Pseudoephedrine Palpitations    Patient Measurements: Height: 5\' 5"  (165.1 cm) Weight: 194 lb 11.2 oz (88.315 kg) IBW/kg (Calculated) : 57 Heparin Dosing Weight: 76 kg  Vital Signs: Temp: 98 F (36.7 C) (04/02 0830) Temp Source: Axillary (04/02 0830) BP: 104/61 mmHg (04/02 0830) Pulse Rate: 112 (04/02 0830)  Labs:  Recent Labs  05/10/14 2005 05/11/14 0230 05/11/14 0800 05/11/14 1635 05/12/14 0243  HGB 13.5 13.1  --   --  12.1  HCT 41.1 41.2  --   --  38.3  PLT 188 228  --   --  169  HEPARINUNFRC  --   --  0.76* 0.68 0.42  CREATININE 0.89 0.82  --   --   --     Estimated Creatinine Clearance: 73 mL/min (by C-G formula based on Cr of 0.82).  Assessment: 69yo female with history of Afib, HTN, NICM, anxiety, CHF and COPD presents with SOB and Afib w/ RVR. Pharmacy is consulted to dose heparin for atrial fibrillation. Confirmatory HL is therapeutic at 0.42. No s/s of bleed  Goal of Therapy:  Heparin level 0.3-0.7 units/ml Monitor platelets by anticoagulation protocol: Yes   Plan:  Continue heparin gtt at 1,050 units/hr Monitor daily HL, CBC, s/s of bleed F/U long term anticoag plan  Manal Kreutzer J 05/12/2014,9:22 AM

## 2014-05-13 ENCOUNTER — Inpatient Hospital Stay (HOSPITAL_COMMUNITY): Payer: Medicare Other

## 2014-05-13 DIAGNOSIS — J09X2 Influenza due to identified novel influenza A virus with other respiratory manifestations: Secondary | ICD-10-CM

## 2014-05-13 DIAGNOSIS — Z79891 Long term (current) use of opiate analgesic: Secondary | ICD-10-CM

## 2014-05-13 LAB — CBC
HCT: 33.8 % — ABNORMAL LOW (ref 36.0–46.0)
HEMATOCRIT: 34.4 % — AB (ref 36.0–46.0)
Hemoglobin: 10.9 g/dL — ABNORMAL LOW (ref 12.0–15.0)
Hemoglobin: 10.4 g/dL — ABNORMAL LOW (ref 12.0–15.0)
MCH: 31.3 pg (ref 26.0–34.0)
MCH: 30.6 pg (ref 26.0–34.0)
MCHC: 31.7 g/dL (ref 30.0–36.0)
MCHC: 30.8 g/dL (ref 30.0–36.0)
MCV: 98.9 fL (ref 78.0–100.0)
MCV: 99.4 fL (ref 78.0–100.0)
PLATELETS: 176 10*3/uL (ref 150–400)
PLATELETS: 163 10*3/uL (ref 150–400)
RBC: 3.48 MIL/uL — ABNORMAL LOW (ref 3.87–5.11)
RBC: 3.4 MIL/uL — AB (ref 3.87–5.11)
RDW: 15.7 % — ABNORMAL HIGH (ref 11.5–15.5)
RDW: 15.6 % — AB (ref 11.5–15.5)
WBC: 4.2 10*3/uL (ref 4.0–10.5)
WBC: 3.6 10*3/uL — ABNORMAL LOW (ref 4.0–10.5)

## 2014-05-13 LAB — MAGNESIUM: MAGNESIUM: 2 mg/dL (ref 1.5–2.5)

## 2014-05-13 LAB — BLOOD GAS, ARTERIAL
Acid-Base Excess: 4.9 mmol/L — ABNORMAL HIGH (ref 0.0–2.0)
Bicarbonate: 30.3 mEq/L — ABNORMAL HIGH (ref 20.0–24.0)
Delivery systems: POSITIVE
Expiratory PAP: 7
FIO2: 0.7 %
INSPIRATORY PAP: 14
O2 SAT: 97.9 %
PATIENT TEMPERATURE: 98.6
PH ART: 7.349 — AB (ref 7.350–7.450)
PO2 ART: 96.4 mmHg (ref 80.0–100.0)
TCO2: 32 mmol/L (ref 0–100)
pCO2 arterial: 56.4 mmHg — ABNORMAL HIGH (ref 35.0–45.0)

## 2014-05-13 LAB — BASIC METABOLIC PANEL
ANION GAP: 6 (ref 5–15)
Anion gap: 5 (ref 5–15)
BUN: 10 mg/dL (ref 6–23)
BUN: 10 mg/dL (ref 6–23)
CALCIUM: 7.9 mg/dL — AB (ref 8.4–10.5)
CHLORIDE: 97 mmol/L (ref 96–112)
CO2: 34 mmol/L — AB (ref 19–32)
CO2: 31 mmol/L (ref 19–32)
CREATININE: 0.66 mg/dL (ref 0.50–1.10)
Calcium: 8 mg/dL — ABNORMAL LOW (ref 8.4–10.5)
Chloride: 101 mmol/L (ref 96–112)
Creatinine, Ser: 0.77 mg/dL (ref 0.50–1.10)
GFR calc Af Amer: >90 mL/min (ref >90)
GFR calc Af Amer: >90 mL/min (ref >90)
GFR calc non Af Amer: 89 mL/min — ABNORMAL LOW (ref >90)
GFR, EST NON AFRICAN AMERICAN: 85 mL/min — AB (ref >90)
GLUCOSE: 91 mg/dL (ref 70–99)
Glucose, Bld: 127 mg/dL — ABNORMAL HIGH (ref 70–99)
POTASSIUM: 2.9 mmol/L — AB (ref 3.5–5.1)
Potassium: 4 mmol/L (ref 3.5–5.1)
SODIUM: 137 mmol/L (ref 135–145)
SODIUM: 137 mmol/L (ref 135–145)

## 2014-05-13 LAB — HEPARIN LEVEL (UNFRACTIONATED): Heparin Unfractionated: <0.1 IU/mL — ABNORMAL LOW (ref 0.30–0.70)

## 2014-05-13 MED ORDER — LORAZEPAM 2 MG/ML IJ SOLN
0.5000 mg | Freq: Once | INTRAMUSCULAR | Status: AC
  Administered 2014-05-13: 0.5 mg via INTRAVENOUS

## 2014-05-13 MED ORDER — VANCOMYCIN HCL IN DEXTROSE 1-5 GM/200ML-% IV SOLN
1000.0000 mg | Freq: Two times a day (BID) | INTRAVENOUS | Status: AC
  Administered 2014-05-13 – 2014-05-19 (×14): 1000 mg via INTRAVENOUS
  Filled 2014-05-13 (×15): qty 200

## 2014-05-13 MED ORDER — PIPERACILLIN-TAZOBACTAM 3.375 G IVPB
3.3750 g | Freq: Three times a day (TID) | INTRAVENOUS | Status: DC
  Filled 2014-05-13: qty 50

## 2014-05-13 MED ORDER — DEXTROSE 5 % IV SOLN
2.0000 g | Freq: Two times a day (BID) | INTRAVENOUS | Status: AC
  Administered 2014-05-13 – 2014-05-19 (×14): 2 g via INTRAVENOUS
  Filled 2014-05-13 (×15): qty 2

## 2014-05-13 MED ORDER — POTASSIUM CHLORIDE CRYS ER 20 MEQ PO TBCR
40.0000 meq | EXTENDED_RELEASE_TABLET | ORAL | Status: AC
  Administered 2014-05-13 (×2): 40 meq via ORAL
  Filled 2014-05-13 (×2): qty 4

## 2014-05-13 MED ORDER — SODIUM CHLORIDE 0.9 % IV SOLN
INTRAVENOUS | Status: DC
Start: 2014-05-13 — End: 2014-05-15
  Administered 2014-05-13: 14:00:00 via INTRAVENOUS
  Administered 2014-05-14: 75 mL via INTRAVENOUS
  Administered 2014-05-14: 50 mL/h via INTRAVENOUS

## 2014-05-13 MED ORDER — ACETAMINOPHEN 325 MG PO TABS: 650.0000 mg | ORAL_TABLET | Freq: Four times a day (QID) | ORAL | Status: DC | PRN

## 2014-05-13 MED ORDER — IOHEXOL 300 MG/ML  SOLN
100.0000 mL | Freq: Once | INTRAMUSCULAR | Status: AC | PRN
  Administered 2014-05-13: 100 mL via INTRAVENOUS

## 2014-05-13 MED ORDER — LORAZEPAM 2 MG/ML IJ SOLN
INTRAMUSCULAR | Status: AC
  Filled 2014-05-13: qty 1

## 2014-05-13 MED ORDER — SODIUM CHLORIDE 0.9 % IV SOLN: INTRAVENOUS | Status: DC

## 2014-05-13 MED ORDER — ACETAMINOPHEN 325 MG PO TABS
325.0000 mg | ORAL_TABLET | Freq: Four times a day (QID) | ORAL | Status: DC | PRN
  Administered 2014-05-14: 325 mg via ORAL
  Filled 2014-05-13: qty 1

## 2014-05-13 MED ORDER — ACETAMINOPHEN 325 MG PO TABS
325.0000 mg | ORAL_TABLET | Freq: Once | ORAL | Status: AC
  Administered 2014-05-13: 325 mg via ORAL
  Filled 2014-05-13: qty 1

## 2014-05-13 NOTE — Care Management Note (Addendum)
    Page 1 of 1   05/22/2014     4:57:44 PM CARE MANAGEMENT NOTE 05/22/2014  Patient:  SIMREN, POPSON   Account Number:  [id number]  Date Initiated:  05/12/2014  Documentation initiated by:  [facility]  Subjective/Objective Assessment:   adm: SOB     Action/Plan:   discharge planning   Anticipated DC Date:  05/21/2014   Anticipated DC Plan:  [place]  CM consult  PCP issues      Choice offered to / List presented to:             Status of service:   Medicare Important Message given?  YES (If response is "NO", the following Medicare IM given date fields will be blank) Date Medicare IM given:  05/14/2014 Medicare IM given by:  MAYO,HENRIETTA Date Additional Medicare IM given:  05/21/2014 Additional Medicare IM given by:  Almyra Free Beverlee Wilmarth  Discharge Disposition:  [place]  Per UR Regulation:  Reviewed for med. necessity/level of care/duration of stay  If discussed at [place] of Stay Meetings, dates discussed:   05/17/2014    Comments:  05/22/14 Ellan Lambert, RN, BSN dc  cancelled on 4/11 due to increase HR and orthostatic hypotension.  05/21/14 Ellan Lambert, RN, BSN [phone] Pt discharging to SNF today, per CSW arrangements.  05/12/14 09:45 CM met with pt and pt confirmed she has "cut ties" with [facility] c they would not increase her pain meds and needs a new PCP.  MD has requested CM give information on Pain Clinic.  CM placed [facility] NUMBER on discharge and pt verbalized understanding she will call number to secure a new PCP.  Pt also verbalized her PCP will have to refer her to a Pain Clinic after she is established with a PCP.  CM gave pt information on [facility]. Unfortunately, pt verbalized her refusal to go to a "place who's goal is to decrease narcotic."  No other CM needs were communicated.  Mariane Masters, BSN, CM [phone].

## 2014-05-13 NOTE — Progress Notes (Signed)
        Subjective:   Pt reports having significant hemoptysis.  She feels really bad but denies CP, palpitations, dizziness, or lightheadedness. Does reports some SOB.   Objective:   Blood pressure 104/33, pulse 103, temperature 100 F (37.8 C), temperature source Oral, resp. rate 24, height 5\' 5"  (1.651 m), weight 88.315 kg (194 lb 11.2 oz), SpO2 92 %.  Physical Exam: Constitutional: Vital signs reviewed. Patient is sitting in bed in no acute distress and somewhat cooperative with exam.  HEENT: [place]/AT; EOMI, conjunctivae normal, no scleral icterus  Cardiovascular: irregularly irregular rhythm, no MRG Pulmonary/Chest: normal respiratory effort, no accessory muscle use, decreased breath sounds with mild crackles at the bases.   Abdominal: Soft. +BS, NT/ND Neurological: A&O x3, CN II-XII grossly intact; non-focal exam Extremities: 2+DP b/l, trace pitting edema b/l   Skin: Warm, dry. Multiple excoriations.   Assessment/Plan:   Principal Problem:  Influenza due to influenza A virus Active Problems:  HLD (hyperlipidemia)  Atrial fibrillation with rapid ventricular response  COPD (chronic obstructive pulmonary disease)  Osteoarthritis  Long term current use of opiate analgesic  HCAP vs. pulmonary hemorrhage: Pt O2 sats kept dropping last PM and was requiring more oxygen, CXR showed development of confluent consolidation in the RUL, likely PNA but could represent hemorrhage or aspiration.  She also has some significant hemoptysis.  Also concerning that hgb has dropped from 12.1-->10.9 in 24 hrs although she was receiving IVF and maybe dilutional.   -cont vanc/cefepime -check PCT -CT Chest  -cont supplemental O2 -check stat CBC   -cont IVF  Hypokalemia- Was previously on potassium supplementation at home but was stopped during initial admission.  She was also on lasix for possible CHF but this has been d/c.   -received supplementation -recheck BMP    Influenza- Pt influenza A positive which is likely the reason for her URI symptoms and contributing to her RVR. -cont tamiflu bid x 5 days -symptomatic tx  Afib with RVR- Likely exacerbated by influenza, albuterol, and continued lasix, and will likely improve with resolution of infection.  -cont cardizem -d/c heparin last PM d/t declining hgb -monitor on telemetry -cont IVF -d/c albuterol, add xopenex to help with tachycardia  Influenza- Pt influenza A positive which is likely the reason for her URI symptoms and contributing to her RVR. -cont tamiflu bid x 5 days -symptomatic tx  COPD- -continue current meds   Disposition Disposition is deferred, awaiting improvement of current medical problems. Anticipated discharge in approximately 1-2 day(s).   Jones Bales, MD PGY-2, Internal Medicine Teaching Service 05/12/2014, 3:41 PM

## 2014-05-13 NOTE — Progress Notes (Signed)
Pt has had several episodes of panic and pulling bipap mask off, with immediate desat. Explained importance of bipap and gave prn valium to help.

## 2014-05-13 NOTE — Progress Notes (Addendum)
Md notified of pt potassium of 2.9, crackles in lung bases.  Pt  Short of breath when up to beside commode. Pt sats in 80's. Replaced finger probe and turned oxygen up to 6lnc. sats up to low 90's  Coughing.  Low temp 100.7 a.  Will continue to monitor. Gave pt cough and pain medication.  md to bedside.   Saunders Revel T

## 2014-05-13 NOTE — Progress Notes (Addendum)
IMTS Night Float progress note  S: Paged by RN regarding increased bibasilar Rales and increased oxygen requirement by the patient. Patient states that she feels unchanged compared to her prior condition during this admission. She is reporting persistent malaise, cough, and dyspnea though stable overall.  O: Blood pressure 142/70, heart rate 130, temperature 100.7, respiratory rate 20, oxygen saturation 92% on 5 L nasal cannula General: resting in bed, in no acute distress Cardiac: Tachycardic, irregular, no rubs murmurs or gallops Pulm: Bibasilar Rales, no significant increased work of breathing Abd: soft, nontender, nondistended, BS present Ext: warm and well perfused, no pedal edema  BMP Latest Ref Rng 05/12/2014 05/11/2014 05/10/2014  Glucose 70 - 99 mg/dL 91 171(H) 115(H)  BUN 6 - 23 mg/dL 10 11 9   Creatinine 0.50 - 1.10 mg/dL 0.77 0.82 0.89  Sodium 135 - 145 mmol/L 137 139 139  Potassium 3.5 - 5.1 mmol/L 2.9(L) 3.6 3.4(L)  Chloride 96 - 112 mmol/L 97 99 98  CO2 19 - 32 mmol/L 34(H) 31 31  Calcium 8.4 - 10.5 mg/dL 7.9(L) 8.2(L) 8.4    A/P: Increased oxygen requirement in the setting of fluid resuscitation at 100 mL per hour. Diuresis was discontinued and fluids were increased in the setting of hypotension down to the 80s over 30s earlier in the afternoon of 05/12/2014. There is some concern now that patient is developing pulmonary edema. However, will try to rule out other etiologies for hypoxia including a developing bacterial pneumonia. Potassium also found to be 2.9. -Discontinue IV fluids -Chest x-ray -Potassium 40 mEq PO every 4 hours for 2 doses  Addendum:  I went back to evaluate [name]. On arrival, she had intermittent coughing while sitting on the portable toilet.  She reports worsening of her breathing tonight and feeling very hot and thirsty and "wet". With RN assistance, she was helped back to bed. She reports being on 2L [place] O2 at home with current o2 at 5-6L and O2  88-90%.    VS Reviewed Oral temp taken up to 100.7 but she was unable to keep in mouth for long due to talking and shaking General: sitting on side of bed, speaking in full sentences CVS: Tachycardia, irregular Lungs: coarse breath sounds b/l with rales on right more than left, predominantly upper to mid fields, mild ronchi Abd: Soft Ext: moving all extremities, was shaking hands when sitting on toilet  [name] is a 69 year old female with COPD admitted initially for presumed CAP, then found to be Influenza A positive. Now with sepsis secondary to Pneumonia (HCAP vs. CAP) in the setting of influenza and hypokalemia.  She received 2 doses of doxycycline this admission then d/c and was started on tamiflu.   Stat CXR ordered--RUL infiltrate Blood cx x2 and draw AM labs Will restart antibiotics--Vancomycin and Cefepime given that she was initially on antibiotics on admission and influenza Continue Tamiflu ABG Continue [place] O2 for now, if worsening, may need to transition to NRB/BiPAP D/C IVF and may need lasix Replace K Check Mag Add tylenol prn fever Will continue to monitor closely   Signed: Jerene Pitch, MD PGY-3, Internal Medicine Resident 05/13/2014,2:24 AM

## 2014-05-13 NOTE — Progress Notes (Addendum)
ABG resuls not downloading 16:33 3 mar 16 PH 7.42 / PC02 46 / P02 133 / HC03 29.9 MD aware

## 2014-05-13 NOTE — Progress Notes (Addendum)
ANTIBIOTIC CONSULT NOTE - INITIAL  Pharmacy Consult for Vancomycin and Zosyn Indication: rule out pneumonia  Allergies  Allergen Reactions  . Ace Inhibitors Cough  . Diphenhydramine Hcl Other (See Comments)    "feels like my skin is crawling, and legs twitches"  . Erythromycin Diarrhea  . Nsaids Nausea And Vomiting  . Nyquil [Pseudoeph-Doxylamine-Dm-Apap] Itching  . Tramadol Hcl Other (See Comments)    stomach pain, hallucinations  . Flexeril [Cyclobenzaprine] Anxiety  . Pseudoephedrine Palpitations    Patient Measurements: Height: 5\' 5"  (165.1 cm) Weight: 194 lb 11.2 oz (88.315 kg) IBW/kg (Calculated) : 57 Adjusted Body Weight:   Vital Signs: Temp: 100.7 F (38.2 C) (04/03 0012) Temp Source: Axillary (04/03 0012) BP: 142/70 mmHg (04/03 0017) Pulse Rate: 107 (04/02 2200) Intake/Output from previous day: 04/02 0701 - 04/03 0700 In: 2200 [P.O.:240; I.V.:1960] Out: 1751 [Urine:1750; Stool:1] Intake/Output from this shift: Total I/O In: 634 [I.V.:634] Out: 150 [Urine:150]  Labs:  Recent Labs  05/10/14 2005 05/11/14 0230 05/12/14 0243 05/12/14 2020 05/12/14 2308  WBC 6.2 6.4 6.0 3.6*  --   HGB 13.5 13.1 12.1 11.3*  --   PLT 188 228 169 163  --   CREATININE 0.89 0.82  --   --  0.77   Estimated Creatinine Clearance: 74.9 mL/min (by C-G formula based on Cr of 0.77). No results for input(s): VANCOTROUGH, VANCOPEAK, VANCORANDOM, GENTTROUGH, GENTPEAK, GENTRANDOM, TOBRATROUGH, TOBRAPEAK, TOBRARND, AMIKACINPEAK, AMIKACINTROU, AMIKACIN in the last 72 hours.   Microbiology: Recent Results (from the past 720 hour(s))  Culture, blood (routine x 2)     Status: None (Preliminary result)   Collection Time: 05/10/14  8:05 PM  Result Value Ref Range Status   Specimen Description BLOOD RIGHT ARM  Final   Special Requests BOTTLES DRAWN AEROBIC AND ANAEROBIC 5CC  Final   Culture   Final           BLOOD CULTURE RECEIVED NO GROWTH TO DATE CULTURE WILL BE HELD FOR 5 DAYS BEFORE  ISSUING A FINAL NEGATIVE REPORT Performed at [facility]    Report Status PENDING  Incomplete  Culture, blood (routine x 2)     Status: None (Preliminary result)   Collection Time: 05/10/14  8:17 PM  Result Value Ref Range Status   Specimen Description BLOOD RIGHT HAND  Final   Special Requests BOTTLES DRAWN AEROBIC AND ANAEROBIC 5CC  Final   Culture   Final           BLOOD CULTURE RECEIVED NO GROWTH TO DATE CULTURE WILL BE HELD FOR 5 DAYS BEFORE ISSUING A FINAL NEGATIVE REPORT Performed at [facility]    Report Status PENDING  Incomplete  Urine culture     Status: None   Collection Time: 05/10/14  8:37 PM  Result Value Ref Range Status   Specimen Description URINE, RANDOM  Final   Special Requests NONE  Final   Colony Count   Final    40,000 COLONIES/ML Performed at [facility]    Culture   Final    Multiple bacterial morphotypes present, none predominant. Suggest appropriate recollection if clinically indicated. Performed at [facility]    Report Status 05/12/2014 FINAL  Final  MRSA PCR Screening     Status: None   Collection Time: 05/11/14 11:29 AM  Result Value Ref Range Status   MRSA by PCR NEGATIVE NEGATIVE Final    Comment:        The GeneXpert MRSA Assay (FDA approved for NASAL specimens  only), is one component of a comprehensive MRSA colonization surveillance program. It is not intended to diagnose MRSA infection nor to guide or monitor treatment for MRSA infections.     Medical History: Past Medical History  Diagnosis Date  . Atrial fibrillation   . Hypertension   . Insomnia   . Nonischemic cardiomyopathy   . Hyperlipidemia   . Anxiety   . Cholelithiasis   . Insomnia   . Mitral regurgitation     noted 2010  . H/O epistaxis   . Rhinitis, allergic   . CHF (congestive heart failure)   . Pneumonia     "several times w/exacerbations of the COPD; nothing in the last year" (07/15/2012)  . COPD (chronic obstructive  pulmonary disease)     as of 7/13 on 2-3L, pfts 10/2008 with mod obstruction  . Chronic bronchitis with COPD (chronic obstructive pulmonary disease)   . Shortness of breath     "all the time right now" (07/15/2012)  . GERD (gastroesophageal reflux disease)   . RWERXVQM(086.7)     "weekly" (07/15/2012)  . Migraines     "weekly for awhile; cleared up as I got older" (07/15/2012)  . DJD (degenerative joint disease)   . Arthritis     "all over" (07/15/2012)  . OCD (obsessive compulsive disorder)   . OCD (obsessive compulsive disorder)   . Depression     h/o SI; "last time I was really serious about it was ~ 1997" (07/15/2012)    Medications:  Prescriptions prior to admission  Medication Sig Dispense Refill Last Dose  . albuterol (PROVENTIL HFA;VENTOLIN HFA) 108 (90 BASE) MCG/ACT inhaler Inhale 1-2 puffs into the lungs every 6 (six) hours as needed for wheezing. 1 Inhaler 0 05/10/2014 at Unknown time  . aspirin 325 MG tablet Take 650 mg by mouth daily.    05/10/2014 at Unknown time  . budesonide-formoterol (SYMBICORT) 160-4.5 MCG/ACT inhaler Inhale 2 puffs into the lungs 2 (two) times daily. 10.2 g 11 05/10/2014 at Unknown time  . busPIRone (BUSPAR) 10 MG tablet Take 10 mg by mouth 3 (three) times daily.   05/10/2014 at Unknown time  . chlorpheniramine (CHLOR-TRIMETON) 4 MG tablet Take 1 tablet (4 mg total) by mouth every 8 (eight) hours as needed for allergies. 90 tablet 0 05/10/2014 at Unknown time  . diazepam (VALIUM) 2 MG tablet Take 1 tablet (2 mg total) by mouth every 12 (twelve) hours as needed for anxiety. 60 tablet 3 05/09/2014 at Unknown time  . diltiazem (TIAZAC) 420 MG 24 hr capsule Take 1 capsule (420 mg total) by mouth daily. 90 capsule 0 05/10/2014 at Unknown time  . FLUoxetine (PROZAC) 20 MG capsule TAKE THREE CAPSULES BY MOUTH ONCE DAILY (Patient taking differently: Take 60 mg by mouth. TAKE THREE CAPSULES BY MOUTH ONCE DAILY) 90 capsule 5 05/10/2014 at Unknown time  . furosemide (LASIX) 40 MG  tablet TAKE TWO TABLETS BY MOUTH TWICE DAILY 120 tablet 5 05/10/2014 at Unknown time  . HYDROcodone-acetaminophen (NORCO) 7.5-325 MG per tablet Take 1 tablet by mouth every 6 (six) hours as needed. 120 tablet 0 05/10/2014 at Unknown time  . HYDROcodone-homatropine (HYCODAN) 5-1.5 MG/5ML syrup Take 5 mLs by mouth every 6 (six) hours as needed for cough. 120 mL 0 05/10/2014 at Unknown time  . nystatin (MYCOSTATIN) 100000 UNIT/ML suspension Take 5 mLs (500,000 Units total) by mouth as needed. (Patient taking differently: Take 500,000 Units by mouth as needed (thrush). ) 60 mL 0 05/10/2014 at Unknown time  .  pantoprazole (PROTONIX) 40 MG tablet TAKE ONE TABLET BY MOUTH TWICE DAILY 60 tablet 5 05/10/2014 at Unknown time  . potassium chloride SA (K-DUR,KLOR-CON) 20 MEQ tablet Take 0.5 tablets (10 mEq total) by mouth 2 (two) times daily. (Patient taking differently: Take 60 mEq by mouth daily. ) 30 tablet 6 05/10/2014 at Unknown time  . tiotropium (SPIRIVA) 18 MCG inhalation capsule Place 1 capsule (18 mcg total) into inhaler and inhale daily. 90 capsule 4 05/10/2014 at Unknown time  . zolpidem (AMBIEN) 5 MG tablet Take 1 tablet (5 mg total) by mouth at bedtime as needed for sleep. 30 tablet 3 Past Month at Unknown time  . guaiFENesin (MUCINEX) 600 MG 12 hr tablet Take 1 tablet (600 mg total) by mouth 2 (two) times daily. 20 tablet 0   . levofloxacin (LEVAQUIN) 500 MG tablet Take 1 tablet (500 mg total) by mouth daily. (Patient not taking: Reported on 05/10/2014) 10 tablet 0 Completed Course at 05/09/14  . predniSONE (DELTASONE) 20 MG tablet Take 1 tablet (20 mg total) by mouth daily with breakfast. (Patient not taking: Reported on 05/10/2014) 10 tablet 0 Completed Course at Unknown time   Scheduled:  . acetaminophen  325 mg Oral Once  . antiseptic oral rinse  7 mL Mouth Rinse BID  . budesonide-formoterol  2 puff Inhalation BID  . busPIRone  10 mg Oral TID  . diltiazem  420 mg Oral Daily  . FLUoxetine  60 mg Oral  Daily  . guaiFENesin  600 mg Oral BID  . ipratropium  0.5 mg Nebulization Q4H  . levalbuterol  0.63 mg Nebulization Q4H  . oseltamivir  75 mg Oral BID  . pantoprazole  40 mg Oral Daily  . potassium chloride  40 mEq Oral 6 times per day   Infusions:    Assessment: 69yo female with history of COPD, CHF and Afib presents with SOB and Afib w/ RVR. Pharmacy is consulted to dose vancomycin and zosyn for suspected HCAP. Pt is febrile to 100.7, WBC 3.6, sCr 0.8.  Goal of Therapy:  Vancomycin trough level 15-20 mcg/ml  Plan:  Zosyn 3.375g IV q8h Vancomycin 1g IV q12h Expected duration 7 days with resolution of temperature and/or normalization of WBC Measure antibiotic drug levels at steady state Follow up culture results, renal function, and clinical course  Andrey Cota. Diona Foley, PharmD Clinical Pharmacist Pager [phone] 05/13/2014,1:58 AM  ADDN: Consulted to changed zosyn to cefepime.  Cefepime 2g IV q12h  Andrey Cota. Diona Foley, PharmD Clinical Pharmacist Pager [phone]

## 2014-05-13 NOTE — Progress Notes (Signed)
Md notified about pt anxious pulled off bipap o2 sats dropped.  Placed pt on NRB and o2 sats improved.  Pt too anxious to wear bipap.  Md will clarify.  Given po meds.  Pt calmer with NRB.  Will continue to monitor. Saunders Revel T

## 2014-05-13 NOTE — Progress Notes (Signed)
Patient seen and examined. Case d/w residents in detail. I agree with findings and plan as outlined in Dr. Ferd Glassing note.  Patient with episodes of hemoptysis today but states her breathing is stable and also noted to have increased oxygen requirements overnight and a new infiltrated in R UL consistent with pneumonia. Will c/w vanc and cefepime (started overnight) and complete course of tamiflu. Would check CT chest given hemoptysis to rule out pulm hemorrhage and d/c heparin gtt. C/w O2 via  prn. Would recheck CBC today.  Tachycardia slowly improving. C/w diltiazem. Patient is not a candidate for a/c for her afib given hemoptysis and patient's wishes not to be on a/c once home.

## 2014-05-13 NOTE — Progress Notes (Signed)
RN called RT to patient room to obtain STAT ABG due to patient having decreased sats.  ABG obtained with critical values reported to MD.  Patient placed on Bipap.  Currently tolerating well.  Will obtain follow up ABG in an hour.  Will continue to monitor.

## 2014-05-13 NOTE — Progress Notes (Addendum)
Called by nurse for worsening dyspnea, WOB, and hypoxia with desaturation to 70's on 6 L  oxygen. Pt consequently placed on venti mask with improvement to SpO2 80's and then on NRB mask with improvement of SpO2 to 90's but then desaturated again to SpO2 70's. I obtained ABG which revealed hypercapnic respiratory failure with pH 7.29 and CO2 64 with O2 69. Lung exam revealed course sounds worse in right upper lung field. She is cyanotic appearing (lips, fingertips). CT chest today with evidence of right upper lobe pneumonia in setting of influenza virus. Pt placed on bipap with repeat ABG 1.5 hrs later with improved pH to 7.42 and CO2 to 46. Will continue bipap with titration per respiratory until respiratory status improves. Will keep NPO for now. If decompensates will need ICU transfer for possible intubation. Will continue vancomycin and cefepime per pharmacy for pneumonia and tamiflu for influenza. Blood cultures with no growth to date and sputum cultures not yet obtained.    Juluis Mire, MD IMTS PGY-2

## 2014-05-13 NOTE — Progress Notes (Signed)
ABG results not downloading. 15:18 3 apr 16 Ph 7.29 /pc02 64 /p02 69 / HC03 31

## 2014-05-14 DIAGNOSIS — R042 Hemoptysis: Secondary | ICD-10-CM

## 2014-05-14 DIAGNOSIS — M25511 Pain in right shoulder: Secondary | ICD-10-CM

## 2014-05-14 DIAGNOSIS — I959 Hypotension, unspecified: Secondary | ICD-10-CM

## 2014-05-14 LAB — POCT I-STAT 3, ART BLOOD GAS (G3+)
ACID-BASE EXCESS: 3 mmol/L — AB (ref 0.0–2.0)
ACID-BASE EXCESS: 5 mmol/L — AB (ref 0.0–2.0)
Acid-Base Excess: 5 mmol/L — ABNORMAL HIGH (ref 0.0–2.0)
BICARBONATE: 31 meq/L — AB (ref 20.0–24.0)
BICARBONATE: 29.9 meq/L — AB (ref 20.0–24.0)
Bicarbonate: 32.3 mEq/L — ABNORMAL HIGH (ref 20.0–24.0)
O2 SAT: 99 %
O2 Saturation: 90 %
O2 Saturation: 91 %
PH ART: 7.307 — AB (ref 7.350–7.450)
PH ART: 7.415 (ref 7.350–7.450)
TCO2: 34 mmol/L (ref 0–100)
TCO2: 33 mmol/L (ref 0–100)
TCO2: 31 mmol/L (ref 0–100)
pCO2 arterial: 65.4 mmHg (ref 35.0–45.0)
pCO2 arterial: 65.4 mmHg (ref 35.0–45.0)
pCO2 arterial: 46.8 mmHg — ABNORMAL HIGH (ref 35.0–45.0)
pH, Arterial: 7.286 — ABNORMAL LOW (ref 7.350–7.450)
pO2, Arterial: 70 mmHg — ABNORMAL LOW (ref 80.0–100.0)
pO2, Arterial: 71 mmHg — ABNORMAL LOW (ref 80.0–100.0)
pO2, Arterial: 135 mmHg — ABNORMAL HIGH (ref 80.0–100.0)

## 2014-05-14 LAB — CBC WITH DIFFERENTIAL/PLATELET
Basophils Absolute: 0 10*3/uL (ref 0.0–0.1)
Basophils Relative: 0 % (ref 0–1)
EOS PCT: 0 % (ref 0–5)
Eosinophils Absolute: 0 10*3/uL (ref 0.0–0.7)
HCT: 31.6 % — ABNORMAL LOW (ref 36.0–46.0)
HEMOGLOBIN: 9.9 g/dL — AB (ref 12.0–15.0)
LYMPHS ABS: 1.2 10*3/uL (ref 0.7–4.0)
Lymphocytes Relative: 35 % (ref 12–46)
MCH: 31.3 pg (ref 26.0–34.0)
MCHC: 31.3 g/dL (ref 30.0–36.0)
MCV: 100 fL (ref 78.0–100.0)
MONO ABS: 0.3 10*3/uL (ref 0.1–1.0)
MONOS PCT: 9 % (ref 3–12)
NEUTROS ABS: 1.9 10*3/uL (ref 1.7–7.7)
NEUTROS PCT: 56 % (ref 43–77)
Platelets: 142 10*3/uL — ABNORMAL LOW (ref 150–400)
RBC: 3.16 MIL/uL — ABNORMAL LOW (ref 3.87–5.11)
RDW: 15.7 % — ABNORMAL HIGH (ref 11.5–15.5)
WBC: 3.4 10*3/uL — ABNORMAL LOW (ref 4.0–10.5)

## 2014-05-14 LAB — BASIC METABOLIC PANEL
ANION GAP: 6 (ref 5–15)
BUN: 7 mg/dL (ref 6–23)
CALCIUM: 7.9 mg/dL — AB (ref 8.4–10.5)
CO2: 30 mmol/L (ref 19–32)
CREATININE: 0.6 mg/dL (ref 0.50–1.10)
Chloride: 102 mmol/L (ref 96–112)
GFR calc Af Amer: >90 mL/min (ref >90)
GFR calc non Af Amer: >90 mL/min (ref >90)
Glucose, Bld: 131 mg/dL — ABNORMAL HIGH (ref 70–99)
Potassium: 3.8 mmol/L (ref 3.5–5.1)
Sodium: 138 mmol/L (ref 135–145)

## 2014-05-14 LAB — LEGIONELLA ANTIGEN, URINE

## 2014-05-14 LAB — HEPARIN LEVEL (UNFRACTIONATED): Heparin Unfractionated: <0.1 IU/mL — ABNORMAL LOW (ref 0.30–0.70)

## 2014-05-14 LAB — FOLATE: Folate: 18.5 ng/mL

## 2014-05-14 LAB — VITAMIN B12: VITAMIN B 12: 469 pg/mL (ref 211–911)

## 2014-05-14 LAB — PROCALCITONIN: Procalcitonin: <0.1 ng/mL

## 2014-05-14 MED ORDER — BUDESONIDE 0.25 MG/2ML IN SUSP
0.2500 mg | Freq: Four times a day (QID) | RESPIRATORY_TRACT | Status: DC
  Filled 2014-05-14 (×3): qty 2

## 2014-05-14 MED ORDER — HYDROCODONE-ACETAMINOPHEN 7.5-325 MG PO TABS: 1.0000 | ORAL_TABLET | Freq: Four times a day (QID) | ORAL | Status: DC | PRN

## 2014-05-14 MED ORDER — DEXTROSE 5 % IV SOLN
500.0000 mg | INTRAVENOUS | Status: DC
  Administered 2014-05-14 – 2014-05-18 (×5): 500 mg via INTRAVENOUS
  Filled 2014-05-14 (×6): qty 500

## 2014-05-14 MED ORDER — HYDROCODONE-ACETAMINOPHEN 5-325 MG PO TABS
1.0000 | ORAL_TABLET | Freq: Four times a day (QID) | ORAL | Status: DC | PRN
  Administered 2014-05-14 – 2014-05-22 (×19): 1 via ORAL
  Filled 2014-05-14 (×19): qty 1

## 2014-05-14 MED ORDER — METHYLPREDNISOLONE SODIUM SUCC 125 MG IJ SOLR
80.0000 mg | Freq: Once | INTRAMUSCULAR | Status: AC
  Administered 2014-05-14: 80 mg via INTRAVENOUS
  Filled 2014-05-14: qty 2

## 2014-05-14 MED ORDER — WHITE PETROLATUM GEL
Status: DC | PRN
  Administered 2014-05-14: 0.2 via TOPICAL
  Filled 2014-05-14: qty 1
  Filled 2014-05-14: qty 28.35

## 2014-05-14 MED ORDER — BUDESONIDE 0.25 MG/2ML IN SUSP
0.2500 mg | Freq: Two times a day (BID) | RESPIRATORY_TRACT | Status: DC
  Administered 2014-05-15 (×2): 0.25 mg via RESPIRATORY_TRACT
  Filled 2014-05-14 (×6): qty 2

## 2014-05-14 MED ORDER — ACETAMINOPHEN 325 MG PO TABS
650.0000 mg | ORAL_TABLET | ORAL | Status: DC | PRN
  Administered 2014-05-14: 325 mg via ORAL
  Filled 2014-05-14: qty 2

## 2014-05-14 NOTE — Progress Notes (Signed)
        Subjective:  Pt had an episode last PM of increased O2 requirements and worsening SOB.  She was placed on bipap but became anxious and was not able to tolerate it.  She was subsequently placed on NRB.  This AM she is satting in the 90's but has increased work of breathing.      Objective:   Filed Vitals:   05/14/14 1215  BP: 109/58  Pulse:   Temp: 97.8 F (36.6 C)  Resp: 28   Physical Exam: Constitutional: Vital signs reviewed. Patient is sitting in bed in no acute distress and somewhat cooperative with exam.  HEENT: [place]/AT; EOMI, conjunctivae normal, no scleral icterus  Cardiovascular: irregularly irregular rhythm, no MRG Pulmonary/Chest: increased respiratory effort, decreased breath sounds with mild basilar crackles and diffuse wheezing.     Abdominal: Soft. +BS, NT/ND Neurological: A&O x3, CN II-XII grossly intact; non-focal exam Extremities: 2+DP b/l, trace pitting edema b/l   Skin: Warm, dry. Multiple excoriations.   Assessment/Plan:   Principal Problem:  Influenza due to influenza A virus Active Problems:  HLD (hyperlipidemia)  Atrial fibrillation with rapid ventricular response  COPD (chronic obstructive pulmonary disease)  Osteoarthritis  Long term current use of opiate analgesic  HCAP- CT chest c/w PNA although PCT was wnl.  Increased WOB, SOB, and O2 requirements.   -cont vanc/cefepime-->added azithromycin -cont IVF  Influenza- Pt influenza A positive which is likely the reason for her URI symptoms and contributing to her RVR. -cont tamiflu bid x 5 days -symptomatic tx  Hypotension- -would be cautious with giving narcs in the setting of hypotension so d/c narcs for now  Hemoptysis- Improving.  Hgb stable. -cont monitoring H/H  COPD- -continue current meds -IV solumedrol 80mg  -->can consider starting prednisone if improvement with solumedrol   Afib with RVR- HR improved.  Likely exacerbated by influenza, albuterol, and  continued lasix, and will likely improve with resolution of infection.  -cont cardizem -d/c heparin d/t declining hgb -monitor on telemetry -cont IVF -cont xopenex to help with tachycardia  Right shoulder pain- -cont tylenol 650mg   -would not give narcs in setting of hypotension  -kpad  Disposition Disposition is deferred, awaiting improvement of current medical problems. Anticipated discharge in approximately 1-2 day(s).   Jones Bales, MD PGY-2, Internal Medicine Teaching Service 11:58 AM

## 2014-05-14 NOTE — Progress Notes (Signed)
Subjective: Overnight, due to hemoptysis and RUL infiltrate on CXR, CT chest was obtained, revealing likely pneumonia in RUL as well as associated lymphadenopathy. Overnight, Kelsey Garcia developed worsening hypoxia to the 70s on 6L O2 by [place]. Kelsey Garcia was placed on a venti-mask with sats improved to the 80s, and then on NRB with improvement to the 90s. Kelsey Garcia then desatted to the 70s again. ABG was 7.29/64/69/31. Kelsey Garcia was ut on bipap with ABG 1.5 hours later showing 7.42/46/133/29.9. Kelsey Garcia was anxious and uncomfortable with the bipap and was removing it with resulting desats. Kelsey Garcia was put back on non-rebreather and given PRN valium for anxiety.   For antibiotics, azithromycin was added to vanc/cefepime for broader coverage in addition to tamiflu.   The patient reports that breathing is "in and out" this morning. Kelsey Garcia states that her hemoptysis started the day after heparin was begun and is unsure about any episodes overnight. As an outpatient, Kelsey Garcia states Kelsey Garcia periodically has a "little bit" of bleeding per rectum from her hemorrhoids. In terms of amount, Kelsey Garcia reports that it is like "breaking an egg"; Kelsey Garcia is unsure about the # of episodes that have occurred in the hospital or if Kelsey Garcia has any melena.   Kelsey Garcia complains of thirst and pain. Denies any dysuria or frequency in urination.    Objective: Vital signs in last 24 hours: Filed Vitals:   05/13/14 2316 05/13/14 2330 05/14/14 0301 05/14/14 0355  BP: 77/34 95/49 115/71   Pulse:      Temp: 98 F (36.7 C)  97.3 F (36.3 C)   TempSrc: Axillary  Axillary   Resp: 24 23 21    Height:      Weight:      SpO2: 91% 99% 98% 100%   Weight change:   Intake/Output Summary (Last 24 hours) at 05/14/14 0656 Last data filed at 05/14/14 0600  Gross per 24 hour  Intake 1937.5 ml  Output    900 ml  Net 1037.5 ml   General: Sitting upright in bed.  Pulm: Increased work of breathing, normal speech is limited by SOB. Difficult pulmonary exam posteriorly, but bilateral wheezes are  auscultated anteriorly.  CV: Irregular rhythm, no murmurs auscultated.  Extremities: 1+ pitting edema in bilateral lower extremities.   Lab Results: CBC    Component Value Date/Time   WBC 3.4* 05/14/2014 0300   RBC 3.16* 05/14/2014 0300   HGB 9.9* 05/14/2014 0300   HCT 31.6* 05/14/2014 0300   PLT 142* 05/14/2014 0300   MCV 100.0 05/14/2014 0300   MCH 31.3 05/14/2014 0300   MCHC 31.3 05/14/2014 0300   RDW 15.7* 05/14/2014 0300   LYMPHSABS 1.2 05/14/2014 0300   MONOABS 0.3 05/14/2014 0300   EOSABS 0.0 05/14/2014 0300   BASOSABS 0.0 05/14/2014 0300   BMET    Component Value Date/Time   NA 138 05/14/2014 0300   K 3.8 05/14/2014 0300   CL 102 05/14/2014 0300   CO2 30 05/14/2014 0300   GLUCOSE 131* 05/14/2014 0300   BUN 7 05/14/2014 0300   CREATININE 0.60 05/14/2014 0300   CREATININE 0.91 10/12/2013 1516   CALCIUM 7.9* 05/14/2014 0300   GFRNONAA >90 05/14/2014 0300   GFRNONAA 66 10/12/2013 1516   GFRAA >90 05/14/2014 0300   GFRAA 76 10/12/2013 1516   Micro Results: Recent Results (from the past 240 hour(s))  Culture, blood (routine x 2)     Status: None (Preliminary result)   Collection Time: 05/10/14  8:05 PM  Result Value Ref Range Status  Specimen Description BLOOD RIGHT ARM  Final   Special Requests BOTTLES DRAWN AEROBIC AND ANAEROBIC 5CC  Final   Culture   Final           BLOOD CULTURE RECEIVED NO GROWTH TO DATE CULTURE WILL BE HELD FOR 5 DAYS BEFORE ISSUING A FINAL NEGATIVE REPORT Performed at [facility]    Report Status PENDING  Incomplete  Culture, blood (routine x 2)     Status: None (Preliminary result)   Collection Time: 05/10/14  8:17 PM  Result Value Ref Range Status   Specimen Description BLOOD RIGHT HAND  Final   Special Requests BOTTLES DRAWN AEROBIC AND ANAEROBIC 5CC  Final   Culture   Final           BLOOD CULTURE RECEIVED NO GROWTH TO DATE CULTURE WILL BE HELD FOR 5 DAYS BEFORE ISSUING A FINAL NEGATIVE REPORT Performed at [facility]    Report Status PENDING  Incomplete  Urine culture     Status: None   Collection Time: 05/10/14  8:37 PM  Result Value Ref Range Status   Specimen Description URINE, RANDOM  Final   Special Requests NONE  Final   Colony Count   Final    40,000 COLONIES/ML Performed at [facility]    Culture   Final    Multiple bacterial morphotypes present, none predominant. Suggest appropriate recollection if clinically indicated. Performed at [facility]    Report Status 05/12/2014 FINAL  Final  MRSA PCR Screening     Status: None   Collection Time: 05/11/14 11:29 AM  Result Value Ref Range Status   MRSA by PCR NEGATIVE NEGATIVE Final    Comment:        The GeneXpert MRSA Assay (FDA approved for NASAL specimens only), is one component of a comprehensive MRSA colonization surveillance program. It is not intended to diagnose MRSA infection nor to guide or monitor treatment for MRSA infections.    Studies/Results: Ct Chest W Contrast  05/13/2014   CLINICAL DATA:  Hemoptysis. Cough. Right upper lobe consolidation on chest radiograph. COPD. Congestive heart failure.  EXAM: CT CHEST WITH CONTRAST  TECHNIQUE: Multidetector CT imaging of the chest was performed during intravenous contrast administration.  CONTRAST:  15mL OMNIPAQUE IOHEXOL 300 MG/ML  SOLN  COMPARISON:  Chest radiograph on 05/13/2014 and previous chest CT on 09/25/2010  FINDINGS: Mediastinum/Lymph Nodes: Mild lymphadenopathy is seen in the mediastinum and bilateral hilar regions, with largest index lymph node in the right paratracheal region measuring 14 mm on image 19/16 2. This is nonspecific but likely reactive in etiology.  Lungs/Pleura: Mild emphysema is noted. Pulmonary airspace disease is seen with predominant involvement of the right upper lobe, but also lesser involvement of the lower lobes, and minimal involvement of the right middle lobe and lingula. This is suspicious for pneumonia with asymmetric  edema considered less likely. No evidence of pleural effusion  Musculoskeletal/Soft Tissues: No suspicious bone lesions or other significant chest wall abnormality. Incompletely healed but benign-appearing fracture of the left lateral sixth rib is seen, which demonstrates some callus formation.  Upper Abdomen: Multiple calcified gallstones noted without evidence of cholecystitis.  IMPRESSION: Bilateral multilobar airspace disease, with greatest involvement of the right upper lobe, suspicious for pneumonia.  Shotty mediastinal and bilateral hilar lymphadenopathy, likely reactive in etiology. Continued followup by chest CT with contrast recommended in 3 months.  No evidence of pleural effusion.  Incompletely healed left lateral sixth rib fracture incidentally noted.  Electronically Signed   By: Earle Gell M.D.   On: 05/13/2014 13:06   Dg Chest Port 1 View  05/13/2014   CLINICAL DATA:  Increased oxygen demand  EXAM: PORTABLE CHEST - 1 VIEW  COMPARISON:  05/11/2014  FINDINGS: There is substantially increased airspace consolidation in the right upper lobe consistent with pneumonia although hemorrhage or aspiration could also produce this appearance. Left lung is clear. No effusion is evident. Heart size is unchanged. Pulmonary vasculature is normal.  IMPRESSION: Development of confluent consolidation in the right upper lobe, likely pneumonia.   Electronically Signed   By: Andreas Newport M.D.   On: 05/13/2014 01:47   Medications: Scheduled Meds: . antiseptic oral rinse  7 mL Mouth Rinse BID  . azithromycin  500 mg Intravenous Q24H  . budesonide-formoterol  2 puff Inhalation BID  . busPIRone  10 mg Oral TID  . ceFEPime (MAXIPIME) IV  2 g Intravenous Q12H  . diltiazem  420 mg Oral Daily  . FLUoxetine  60 mg Oral Daily  . guaiFENesin  600 mg Oral BID  . ipratropium  0.5 mg Nebulization Q4H  . levalbuterol  0.63 mg Nebulization Q4H  . oseltamivir  75 mg Oral BID  . pantoprazole  40 mg Oral Daily  .  vancomycin  1,000 mg Intravenous Q12H   Continuous Infusions: . sodium chloride 75 mL (05/14/14 0130)   PRN Meds:.acetaminophen, diazepam   Assessment/Plan:Kelsey Garcia is a 69 yo female with a PMH significant for gold stage III COPD, atrial fibrillation, possibleHFpEF, OCD, GERD, osteoarthritis, HTN and hyperlipidemia who was admitted for SOB, wheezing, tachycardia, fevers, now found to be influenza positive, have likely pneumonia and atrial fibrillation with RVR.   Principal Problem:   Influenza due to influenza A virus Active Problems:   HLD (hyperlipidemia)   Atrial fibrillation with rapid ventricular response   COPD (chronic obstructive pulmonary disease)   Osteoarthritis   Long term current use of opiate analgesic   ** SOB, wheezing, tachycardia, fevers: Febrile and tachycardic on admission with symptoms of SOB, wheezing, non-productive cough, increased oxygen requirement. There was initial concern for possible CAP with a CXR showing ill-defined supra-hilar opacity. Kelsey Garcia was found to be influenza A positive, and initial antibiotics ceftriaxone and doxycycline were stopped 4/1. With hemoptysis and new RUL consolidation seen on CXR 4/3, suspicion for pneumonia again raised and started on vancomycin/cefepime 4/2. Overnight, significant hypoxia is concerning for worsening respiratory status.  -- Continue tamiflu (day 4/5) -- On vancomycin/cefepime (started: 4/2), started azithromycin overnight -- Blood cultures from 3/31 NGTD, blood cultures re-drawn 4/3 overnight -- HIV antibody negative, legionella urinary antigen pending, sputum culture and gram stain need to be collected -- Strep pneumoniae urinary antigen negative -- Now on partial NRB, titrate oxygen to maintain sat >92% -- NPO in case of need for intubation -- Will start IV solumedrol 80 mg    ** Anemia, thrombocytopenia: OA, normal hemoglobin of 13.5, dropping to 9.9 today 4/4 (from 10.4 last night) over the course of admission.  The patient admits to bleeding symptoms of hemoptysis and bleeding from hemorrhoids. This appears to be acute during the setting of this hospitalization; differential includes blood loss due to hemoptysis or GI losses vs HITT. 4T score is 2, though unclear if prior heparin exposure. Thrombocytopenia could certainly occur int the setting of sepsis, but this seems less likely etiology in conjunction with anemia. MCV is 98-100. Last folate and B12 normal in 2013. Per outpatient notes from that time, Kelsey Garcia reported  needing B12 shots in the past.  -- Continue to monitor CBC -- Will check B12/folate -- Off heparin drip  ** Atrial fibrillation with RVR: Tachycardic to 140s on admission in the setting of missing home diltiazem. Continues to be tachycardic and in a-fib despite diltiazem drip and re-starting home diltiazem. Now off diltiazem drip since 4/1. CHADS-VASC score is 4. Continues to decline outpatient anticoagulation other than her home aspirin. TSH mildly low at 0.293. T4 WNL at 1.04.  -- Monitor on telemetry -- Off heparin drip due to declining hemoglobin -- Home diltiazem (tiazac 24 hr capsule) 420 mg daily ; now with NPO status, may need to re-start diltiazem drip tomorrow -- Hemoglobin A1c 5.7, sightly elevated with no history of diabetes, will likely need follow- up as an outpatient -- IVF NS@ 75 cc/hr  ** COPD: GOLD stage III.  -- Continue Symbicort 2 puffs twice daily -- Continue DuoNebs every 4 hours -- Antiseptic oral rinse with steroid inhalers  ** HFpEF: Unclear history of CHF.  -- Continue Aspirin 81 mg -- Lasix held in setting of tachycardia, questionable history of HFpEF  ** Hypertension: Appears well controlled.  -- Diltiazem  ** Hyperlipidemia: Lipid panel 10/12/2013 notable for total cholesterol 194, HDL 52, LDL 127, triglycerides 76. Kelsey Garcia is not on any statin therapy. -- Defer starting statin therapy as outpatient  ** Osteoarthritis: Kelsey Garcia last saw Dr. Shari Heritage  medicine] on 02/05/14 for neck pain. C-spine x-ray 01/31/14 showed multilevel degenerative changes including bilateral facet arthropathy at C3/C4, C4/C5, C5/C6, C6/C7. Kelsey Garcia also follows with outpatient physical therapy, last seen 05/08/14. At her office visit 04/16/14, Kelsey Garcia reported improved functional status on Norco 7/325 mg but then requested Kelsey Garcia receive increased doses for mood symptoms and became verbally abusive towards providers when they attempted to explain to her that mood symptoms were not an indication for a change in her dosage. Consequently, it was decided that Kelsey Garcia was to be fired from the clinic at her most recent clinic visit yesterday. -- Will hold Norco 7.5/325 mg Q6 PRN with current hypotension -- Provide her with resources to establish PCP care elsewhere  ** OCD: Kelsey Garcia follows with a therapist at [facility]. -- Continue Buspar 10 mg 3 times daily -- Will hold home valium 2 mg every 12 hours PRN with current hypotension -- Continue Prozac 60 mg  **FEN/GI: -- Diet: NPO for possible need for intubation; holding all oral meds -- Hypokalemic on admission, 3.8 today after replacement  ** PPx:  -- DVT: off heparin as above -- Will transition from home protonix to IV with NPO status and IV solumedrol   ** Dispo:  -- Full code -- Will need resources to establish PCP prior to discharge -- Will need cardiology follow-up prior to discharge   This is a Careers information officer Note.  The care of the patient was discussed with Dr. Gordy Levan and the assessment and plan formulated with their assistance.  Please see their attached note for official documentation of the daily encounter.   LOS: 3 days   Dierdre Forth, Med Student 05/14/2014, 6:56 AM [phone]

## 2014-05-14 NOTE — Progress Notes (Signed)
Patient seen and examined. Case d/w residents in detail on morning rounds. I agree with findings and plan as documented in Dr. Ferd Glassing note.  Patient with worsening hypoxia last night with hypercarbia and resp acidosis requiring BIPAP. She improved on the BIPAP but could not tolerate it and was transitioned to partial non rebreathed and has maintained her O2 sats in the 90s today. She is upset that she was unable to get her pain medications secondary to NPO status (due to fear of possible intubation) and borderline BP.  Her CT scan shows likely multifocal PNA worse in her R upper lobe and she had bilateral exp wheeze on exam today. She was continued on vanc and cefepime and azithromycin was added for atypical coverage. Would complete course of tamiflu for + influenza.   Patient was also given a dose of solumedrol today for wheezing and possible acute COPD exacerbation precipated by HCAP. Will likely transition to PO prednisone in AM.  Would c/w diltiazem for afib with rvr. No heparin gtt as patient with hemoptysis (improving off heparin gtt) and refuses long term a/c.  Will monitor closely in SDU

## 2014-05-14 NOTE — Progress Notes (Signed)
PT Cancellation Note  Patient Details Name: Kelsey Garcia MRN: [id number] DOB: 1946-02-07   Cancelled Treatment:    Reason Eval/Treat Not Completed: Medical issues which prohibited therapy, spoke with RN who confirmed respiratory status mentioned in chart. Hold PT for today.    [place], [place] 05/14/2014, 1:06 PM

## 2014-05-15 ENCOUNTER — Inpatient Hospital Stay (HOSPITAL_COMMUNITY): Payer: Medicare Other

## 2014-05-15 ENCOUNTER — Ambulatory Visit: Payer: Medicare Other | Attending: Sports Medicine | Admitting: Physical Therapy

## 2014-05-15 DIAGNOSIS — M542 Cervicalgia: Secondary | ICD-10-CM | POA: Insufficient documentation

## 2014-05-15 DIAGNOSIS — J9621 Acute and chronic respiratory failure with hypoxia: Secondary | ICD-10-CM

## 2014-05-15 DIAGNOSIS — Y95 Nosocomial condition: Secondary | ICD-10-CM

## 2014-05-15 DIAGNOSIS — J101 Influenza due to other identified influenza virus with other respiratory manifestations: Secondary | ICD-10-CM

## 2014-05-15 DIAGNOSIS — X58XXXD Exposure to other specified factors, subsequent encounter: Secondary | ICD-10-CM

## 2014-05-15 DIAGNOSIS — S2232XG Fracture of one rib, left side, subsequent encounter for fracture with delayed healing: Secondary | ICD-10-CM | POA: Insufficient documentation

## 2014-05-15 DIAGNOSIS — J9620 Acute and chronic respiratory failure, unspecified whether with hypoxia or hypercapnia: Secondary | ICD-10-CM

## 2014-05-15 DIAGNOSIS — G894 Chronic pain syndrome: Secondary | ICD-10-CM

## 2014-05-15 DIAGNOSIS — R590 Localized enlarged lymph nodes: Secondary | ICD-10-CM

## 2014-05-15 DIAGNOSIS — F17200 Nicotine dependence, unspecified, uncomplicated: Secondary | ICD-10-CM

## 2014-05-15 DIAGNOSIS — M5030 Other cervical disc degeneration, unspecified cervical region: Secondary | ICD-10-CM | POA: Insufficient documentation

## 2014-05-15 DIAGNOSIS — F419 Anxiety disorder, unspecified: Secondary | ICD-10-CM

## 2014-05-15 DIAGNOSIS — M1388 Other specified arthritis, other site: Secondary | ICD-10-CM | POA: Insufficient documentation

## 2014-05-15 DIAGNOSIS — D61818 Other pancytopenia: Secondary | ICD-10-CM

## 2014-05-15 DIAGNOSIS — R042 Hemoptysis: Secondary | ICD-10-CM

## 2014-05-15 DIAGNOSIS — E8729 Other acidosis: Secondary | ICD-10-CM | POA: Insufficient documentation

## 2014-05-15 DIAGNOSIS — J431 Panlobular emphysema: Secondary | ICD-10-CM

## 2014-05-15 DIAGNOSIS — E872 Acidosis: Secondary | ICD-10-CM | POA: Insufficient documentation

## 2014-05-15 DIAGNOSIS — J189 Pneumonia, unspecified organism: Secondary | ICD-10-CM

## 2014-05-15 DIAGNOSIS — J1108 Influenza due to unidentified influenza virus with specified pneumonia: Secondary | ICD-10-CM

## 2014-05-15 DIAGNOSIS — Z9981 Dependence on supplemental oxygen: Secondary | ICD-10-CM

## 2014-05-15 LAB — BLOOD GAS, ARTERIAL
Acid-Base Excess: 5 mmol/L — ABNORMAL HIGH (ref 0.0–2.0)
Bicarbonate: 30.3 mEq/L — ABNORMAL HIGH (ref 20.0–24.0)
DRAWN BY: 398991
O2 SAT: 93.8 %
PATIENT TEMPERATURE: 97.9
TCO2: 32.1 mmol/L (ref 0–100)
pCO2 arterial: 56.1 mmHg — ABNORMAL HIGH (ref 35.0–45.0)
pH, Arterial: 7.35 (ref 7.350–7.450)
pO2, Arterial: 70.2 mmHg — ABNORMAL LOW (ref 80.0–100.0)

## 2014-05-15 LAB — GLUCOSE, CAPILLARY
Glucose-Capillary: 120 mg/dL — ABNORMAL HIGH (ref 70–99)
Glucose-Capillary: 138 mg/dL — ABNORMAL HIGH (ref 70–99)
Glucose-Capillary: 128 mg/dL — ABNORMAL HIGH (ref 70–99)
Glucose-Capillary: 182 mg/dL — ABNORMAL HIGH (ref 70–99)

## 2014-05-15 LAB — COMPREHENSIVE METABOLIC PANEL
ALBUMIN: 2.4 g/dL — AB (ref 3.5–5.2)
ALK PHOS: 62 U/L (ref 39–117)
ALT: 14 U/L (ref 0–35)
ANION GAP: 6 (ref 5–15)
AST: 29 U/L (ref 0–37)
BILIRUBIN TOTAL: 0.8 mg/dL (ref 0.3–1.2)
BUN: 6 mg/dL (ref 6–23)
CHLORIDE: 103 mmol/L (ref 96–112)
CO2: 30 mmol/L (ref 19–32)
Calcium: 8.5 mg/dL (ref 8.4–10.5)
Creatinine, Ser: 0.49 mg/dL — ABNORMAL LOW (ref 0.50–1.10)
GFR calc Af Amer: >90 mL/min (ref >90)
GFR calc non Af Amer: >90 mL/min (ref >90)
Glucose, Bld: 136 mg/dL — ABNORMAL HIGH (ref 70–99)
POTASSIUM: 3.9 mmol/L (ref 3.5–5.1)
SODIUM: 139 mmol/L (ref 135–145)
TOTAL PROTEIN: 5.5 g/dL — AB (ref 6.0–8.3)

## 2014-05-15 LAB — CBC
HEMATOCRIT: 34.1 % — AB (ref 36.0–46.0)
Hemoglobin: 10.8 g/dL — ABNORMAL LOW (ref 12.0–15.0)
MCH: 31 pg (ref 26.0–34.0)
MCHC: 31.7 g/dL (ref 30.0–36.0)
MCV: 98 fL (ref 78.0–100.0)
Platelets: 125 10*3/uL — ABNORMAL LOW (ref 150–400)
RBC: 3.48 MIL/uL — ABNORMAL LOW (ref 3.87–5.11)
RDW: 15 % (ref 11.5–15.5)
WBC: 2.7 10*3/uL — ABNORMAL LOW (ref 4.0–10.5)

## 2014-05-15 MED ORDER — MORPHINE SULFATE 2 MG/ML IJ SOLN
1.0000 mg | Freq: Once | INTRAMUSCULAR | Status: AC
  Administered 2014-05-15: 1 mg via INTRAVENOUS
  Filled 2014-05-15: qty 1

## 2014-05-15 MED ORDER — FUROSEMIDE 10 MG/ML IJ SOLN: 20.0000 mg | Freq: Once | INTRAMUSCULAR | Status: DC

## 2014-05-15 MED ORDER — FUROSEMIDE 10 MG/ML IJ SOLN
40.0000 mg | Freq: Once | INTRAMUSCULAR | Status: AC
  Administered 2014-05-15: 40 mg via INTRAVENOUS

## 2014-05-15 MED ORDER — FUROSEMIDE 10 MG/ML IJ SOLN
INTRAMUSCULAR | Status: AC
  Filled 2014-05-15: qty 4

## 2014-05-15 MED ORDER — MORPHINE SULFATE 2 MG/ML IJ SOLN
2.0000 mg | INTRAMUSCULAR | Status: DC | PRN
  Administered 2014-05-15 – 2014-05-16 (×6): 2 mg via INTRAVENOUS
  Filled 2014-05-15 (×6): qty 1

## 2014-05-15 NOTE — Progress Notes (Signed)
Subjective: Kelsey Garcia was seen and examined at bedside this morning. She was sleepy while sitting up right and reports feeling very anxious. She is also not able to speak in full sentences with partial NRB on.   Objective: Vital signs in last 24 hours: Filed Vitals:   05/15/14 0800 05/15/14 0839 05/15/14 0845 05/15/14 0947  BP:      Pulse:    104  Temp:      TempSrc:      Resp: 26  22   Height:      Weight:      SpO2: 90% 99% 86% 94%   Weight change:   Intake/Output Summary (Last 24 hours) at 05/15/14 0951 Last data filed at 05/15/14 0800  Gross per 24 hour  Intake   2450 ml  Output    275 ml  Net   2175 ml   Vitals reviewed. General: sitting up in bed, eyes closed, feeling anxious HEENT: Eyes closed Cardiac: Tachycardia, irregular Pulm: Diffuse rales b/l Abd: soft, +bs Ext: moving all extremities, scds in place Neuro: feeling anxious, answering questions appropriately  Lab Results: Basic Metabolic Panel:  Recent Labs Lab 05/13/14 0111  05/14/14 0300 05/15/14 0344  NA  --   < > 138 139  K  --   < > 3.8 3.9  CL  --   < > 102 103  CO2  --   < > 30 30  GLUCOSE  --   < > 131* 136*  BUN  --   < > 7 6  CREATININE  --   < > 0.60 0.49*  CALCIUM  --   < > 7.9* 8.5  MG 2.0  --   --   --   < > = values in this interval not displayed. Liver Function Tests:  Recent Labs Lab 05/10/14 2005 05/15/14 0344  AST 18 29  ALT 15 14  ALKPHOS 101 62  BILITOT 0.6 0.8  PROT 6.2 5.5*  ALBUMIN 3.0* 2.4*   CBC:  Recent Labs Lab 05/10/14 2005  05/14/14 0300 05/15/14 0344  WBC 6.2  < > 3.4* 2.7*  NEUTROABS 4.8  --  1.9  --   HGB 13.5  < > 9.9* 10.8*  HCT 41.1  < > 31.6* 34.1*  MCV 98.1  < > 100.0 98.0  PLT 188  < > 142* 125*  < > = values in this interval not displayed. CBG:  Recent Labs Lab 05/14/14 2335 05/15/14 0553  GLUCAP 120* 138*   Hemoglobin A1C:  Recent Labs Lab 05/11/14 0230  HGBA1C 5.7*   Thyroid Function Tests:  Recent Labs Lab  05/11/14 0230 05/11/14 1414  TSH 0.293*  --   FREET4  --  1.04   Anemia Panel:  Recent Labs Lab 05/14/14 1006  VITAMINB12 469  FOLATE 18.5   Urine Drug Screen: Drugs of Abuse     Component Value Date/Time   LABOPIA PPS 10/30/2011 1115   LABOPIA POSITIVE* 09/25/2010 0149   COCAINSCRNUR NEG 10/30/2011 1115   COCAINSCRNUR NONE DETECTED 09/25/2010 0149   LABBENZ PPS 10/30/2011 1115   LABBENZ NEG 10/03/2010 1153   LABBENZ NONE DETECTED 09/25/2010 0149   AMPHETMU NEG 10/30/2011 1115   AMPHETMU NEG 10/03/2010 1153   AMPHETMU NONE DETECTED 09/25/2010 0149   THCU NEG 10/30/2011 1115   THCU NONE DETECTED 09/25/2010 0149   LABBARB NEG 10/30/2011 1115   LABBARB NONE DETECTED 09/25/2010 0149    Urinalysis:  Recent Labs Lab 05/10/14 2037  COLORURINE YELLOW  LABSPEC 1.014  PHURINE 7.5  GLUCOSEU NEGATIVE  HGBUR NEGATIVE  BILIRUBINUR NEGATIVE  KETONESUR NEGATIVE  PROTEINUR NEGATIVE  UROBILINOGEN 1.0  NITRITE NEGATIVE  LEUKOCYTESUR NEGATIVE   Micro Results: Recent Results (from the past 240 hour(s))  Culture, blood (routine x 2)     Status: None (Preliminary result)   Collection Time: 05/10/14  8:05 PM  Result Value Ref Range Status   Specimen Description BLOOD RIGHT ARM  Final   Special Requests BOTTLES DRAWN AEROBIC AND ANAEROBIC 5CC  Final   Culture   Final           BLOOD CULTURE RECEIVED NO GROWTH TO DATE CULTURE WILL BE HELD FOR 5 DAYS BEFORE ISSUING A FINAL NEGATIVE REPORT Performed at [facility]    Report Status PENDING  Incomplete  Culture, blood (routine x 2)     Status: None (Preliminary result)   Collection Time: 05/10/14  8:17 PM  Result Value Ref Range Status   Specimen Description BLOOD RIGHT HAND  Final   Special Requests BOTTLES DRAWN AEROBIC AND ANAEROBIC 5CC  Final   Culture   Final           BLOOD CULTURE RECEIVED NO GROWTH TO DATE CULTURE WILL BE HELD FOR 5 DAYS BEFORE ISSUING A FINAL NEGATIVE REPORT Performed at [facility]     Report Status PENDING  Incomplete  Urine culture     Status: None   Collection Time: 05/10/14  8:37 PM  Result Value Ref Range Status   Specimen Description URINE, RANDOM  Final   Special Requests NONE  Final   Colony Count   Final    40,000 COLONIES/ML Performed at [facility]    Culture   Final    Multiple bacterial morphotypes present, none predominant. Suggest appropriate recollection if clinically indicated. Performed at [facility]    Report Status 05/12/2014 FINAL  Final  MRSA PCR Screening     Status: None   Collection Time: 05/11/14 11:29 AM  Result Value Ref Range Status   MRSA by PCR NEGATIVE NEGATIVE Final    Comment:        The GeneXpert MRSA Assay (FDA approved for NASAL specimens only), is one component of a comprehensive MRSA colonization surveillance program. It is not intended to diagnose MRSA infection nor to guide or monitor treatment for MRSA infections.   Culture, blood (routine x 2)     Status: None (Preliminary result)   Collection Time: 05/13/14  2:30 AM  Result Value Ref Range Status   Specimen Description BLOOD LEFT ARM  Final   Special Requests BOTTLES DRAWN AEROBIC AND ANAEROBIC 5CC EACH  Final   Culture   Final           BLOOD CULTURE RECEIVED NO GROWTH TO DATE CULTURE WILL BE HELD FOR 5 DAYS BEFORE ISSUING A FINAL NEGATIVE REPORT Performed at [facility]    Report Status PENDING  Incomplete  Culture, blood (routine x 2)     Status: None (Preliminary result)   Collection Time: 05/13/14  2:45 AM  Result Value Ref Range Status   Specimen Description BLOOD LEFT HAND  Final   Special Requests BOTTLES DRAWN AEROBIC AND ANAEROBIC 5CC EACH  Final   Culture   Final           BLOOD CULTURE RECEIVED NO GROWTH TO DATE CULTURE WILL BE HELD FOR 5 DAYS BEFORE ISSUING A FINAL NEGATIVE REPORT Performed at [facility]    Report Status PENDING  Incomplete   Studies/Results: Ct Chest W Contrast  05/13/2014    CLINICAL DATA:  Hemoptysis. Cough. Right upper lobe consolidation on chest radiograph. COPD. Congestive heart failure.  EXAM: CT CHEST WITH CONTRAST  TECHNIQUE: Multidetector CT imaging of the chest was performed during intravenous contrast administration.  CONTRAST:  140mL OMNIPAQUE IOHEXOL 300 MG/ML  SOLN  COMPARISON:  Chest radiograph on 05/13/2014 and previous chest CT on 09/25/2010  FINDINGS: Mediastinum/Lymph Nodes: Mild lymphadenopathy is seen in the mediastinum and bilateral hilar regions, with largest index lymph node in the right paratracheal region measuring 14 mm on image 19/16 2. This is nonspecific but likely reactive in etiology.  Lungs/Pleura: Mild emphysema is noted. Pulmonary airspace disease is seen with predominant involvement of the right upper lobe, but also lesser involvement of the lower lobes, and minimal involvement of the right middle lobe and lingula. This is suspicious for pneumonia with asymmetric edema considered less likely. No evidence of pleural effusion  Musculoskeletal/Soft Tissues: No suspicious bone lesions or other significant chest wall abnormality. Incompletely healed but benign-appearing fracture of the left lateral sixth rib is seen, which demonstrates some callus formation.  Upper Abdomen: Multiple calcified gallstones noted without evidence of cholecystitis.  IMPRESSION: Bilateral multilobar airspace disease, with greatest involvement of the right upper lobe, suspicious for pneumonia.  Shotty mediastinal and bilateral hilar lymphadenopathy, likely reactive in etiology. Continued followup by chest CT with contrast recommended in 3 months.  No evidence of pleural effusion.  Incompletely healed left lateral sixth rib fracture incidentally noted.   Electronically Signed   By: Earle Gell M.D.   On: 05/13/2014 13:06   Dg Chest Port 1 View  05/15/2014   CLINICAL DATA:  Acute respiratory failure.  EXAM: PORTABLE CHEST - 1 VIEW  COMPARISON:  May 13, 2014.  FINDINGS: Stable  cardiomediastinal silhouette. No pneumothorax or significant pleural effusion is noted. Increased mixed interstitial and airspace opacities are noted throughout both lungs, particularly in the right lung. This is consistent with worsening pneumonia or possibly edema.  IMPRESSION: Increased bilateral lung opacities are noted, with right worse than left. This is concerning for worsening pneumonia or edema. Followup radiographs are recommended.   Electronically Signed   By: Marijo Conception, M.D.   On: 05/15/2014 09:05   Medications: I have reviewed the patient's current medications. Scheduled Meds: . antiseptic oral rinse  7 mL Mouth Rinse BID  . azithromycin  500 mg Intravenous Q24H  . budesonide (PULMICORT) nebulizer solution  0.25 mg Nebulization BID  . busPIRone  10 mg Oral TID  . ceFEPime (MAXIPIME) IV  2 g Intravenous Q12H  . diltiazem  420 mg Oral Daily  . FLUoxetine  60 mg Oral Daily  . guaiFENesin  600 mg Oral BID  . ipratropium  0.5 mg Nebulization Q4H  . levalbuterol  0.63 mg Nebulization Q4H  . oseltamivir  75 mg Oral BID  . pantoprazole  40 mg Oral Daily  . vancomycin  1,000 mg Intravenous Q12H   Continuous Infusions:  PRN Meds:.diazepam, HYDROcodone-acetaminophen, white petrolatum Assessment/Plan: Principal Problem:   HCAP (healthcare-associated pneumonia) Active Problems:   Atrial fibrillation with rapid ventricular response   COPD (chronic obstructive pulmonary disease)   HLD (hyperlipidemia)   Osteoarthritis   Anxiety   Long term current use of opiate analgesic   Chronic diastolic CHF (congestive heart failure)   Influenza due to influenza A virus   Pancytopenia   Respiratory acidosis   Hemoptysis  Mediastinal and hilar lymphadenopathy on CT   Fracture of rib of left side with delayed healing  Acute on chronic respiratory failure secondary to HCAP in setting of influenza with acute respiratory acidosis--Patient reports being on 2L [place] O2 at home but currently on  partial NRB.  Appears to be worsening but afebrile and now leukopenic. Currently holding o2 sats on partial NRB but reports feeling anxious and not able to speak in full sentences. CT chest 4/3 showed b/l multilobar airspace disease worse in RUL. Mediastinal and b/l hilar lymphadenopathy. Initially on doxycycline then d/c and restarted on Vancomycin and Cefepime 4/2 and Azithromycin added 4/4. Tamiflu day 5 today. ABG    Component Value Date/Time   PHART 7.350 05/15/2014 0840   PCO2ART 56.1* 05/15/2014 0840   PO2ART 70.2* 05/15/2014 0840   HCO3 30.3* 05/15/2014 0840   TCO2 32.1 05/15/2014 0840   O2SAT 93.8 05/15/2014 0840   -repeat CXR this AM shows worsening b/l opacities worse on right than left. PNA and/or edema -given worsening infiltrate and patient was on IVF, d/c IVF, will give lasix today with caution for possible hypotension -continue IV abx--day 4 of Vancomycin and Cefepime, day 2 of Azithromycin -consider pulmonary consult -keep o2 stats around 88-92% -Tamiflu day 5 -Discussed with pulmonary who will follow along--discussed possible intubation with patient  Hemoptysis--improving, did have some blood on towel on table next to bed. Plt trending down.  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 Not on anticoagulation as she has refused in the past. Was intially on heparin gtt on admission but d/c due to hemoptysis -cont Diltiazem  Hypotension--BP improved this morning -d/c IVF in setting of pulmonary edema -will give lasix with caution for BP -monitor I/O's -narcotics were limited given hypotension  Anxiety and Chronic pain--anxiety remains uncontrolled and likely exacerbated in setting of respiratory distress -currently on buspar and norco prn and valium  prn -caution for hypotension with pain medication  Pancytopenia--worsening leukopenia despite recent start of steroids. Plt trending down in setting of acute illness. Hb improving.  -continue to monitor -consider smear review  Mediastinal and hilar lymphadenopathy--b/l on CT scan 05/13/14. Recommended CT chest with contrast follow up in 3 months  Left lateral 6th rib fracture--incidental finding on chest CT, incompletely healed -continue to monitor, pain control as BP can tolerate  Diet:  HH DVT Ppx: SCDs Dispo: Disposition is deferred at this time, awaiting improvement of current medical problems.    The patient does not have a current PCP (No Pcp Per Patient) and does need an [facility] hospital follow-up appointment after discharge. Recently dismissed from Internal Medicine clinic, will need to find new PCP. Will provide information for [facility]  The patient does not know have transportation limitations that hinder transportation to clinic appointments.  Services Needed at time of discharge: Y = Yes, Blank = No PT:   OT:   RN:   Equipment:   Other:     LOS: 4 days   Wilber Oliphant, MD 05/15/2014, 9:51 AM

## 2014-05-15 NOTE — Progress Notes (Signed)
Subjective: States that her hemoptysis is improved. She endorses fatigue this AM. Thinks her breathing may be a little better than yesterday. She feels panicked.   Objective: Vital signs in last 24 hours: Filed Vitals:   05/15/14 0845 05/15/14 0915 05/15/14 0947 05/15/14 1000  BP:      Pulse:   104   Temp:      TempSrc:      Resp: 22 25  20   Height:      Weight:      SpO2: 86% 83% 94% 94%   Weight change:   Intake/Output Summary (Last 24 hours) at 05/15/14 1033 Last data filed at 05/15/14 0800  Gross per 24 hour  Intake   2375 ml  Output    275 ml  Net   2100 ml   General: Lying in bed, appears fatigued.  Pulm: Normal speech somewhat limited by shortness of breath, diffuse wheezing with poor air movement over both lung fields.  CV: RRR.   Lab Results: CBC    Component Value Date/Time   WBC 2.7* 05/15/2014 0344   RBC 3.48* 05/15/2014 0344   HGB 10.8* 05/15/2014 0344   HCT 34.1* 05/15/2014 0344   PLT 125* 05/15/2014 0344   MCV 98.0 05/15/2014 0344   MCH 31.0 05/15/2014 0344   MCHC 31.7 05/15/2014 0344   RDW 15.0 05/15/2014 0344   LYMPHSABS 1.2 05/14/2014 0300   MONOABS 0.3 05/14/2014 0300   EOSABS 0.0 05/14/2014 0300   BASOSABS 0.0 05/14/2014 0300   BMET    Component Value Date/Time   NA 139 05/15/2014 0344   K 3.9 05/15/2014 0344   CL 103 05/15/2014 0344   CO2 30 05/15/2014 0344   GLUCOSE 136* 05/15/2014 0344   BUN 6 05/15/2014 0344   CREATININE 0.49* 05/15/2014 0344   CREATININE 0.91 10/12/2013 1516   CALCIUM 8.5 05/15/2014 0344   GFRNONAA >90 05/15/2014 0344   GFRNONAA 66 10/12/2013 1516   GFRAA >90 05/15/2014 0344   GFRAA 76 10/12/2013 1516   Micro Results: Recent Results (from the past 240 hour(s))  Culture, blood (routine x 2)     Status: None (Preliminary result)   Collection Time: 05/10/14  8:05 PM  Result Value Ref Range Status   Specimen Description BLOOD RIGHT ARM  Final   Special Requests BOTTLES DRAWN AEROBIC AND ANAEROBIC 5CC  Final     Culture   Final           BLOOD CULTURE RECEIVED NO GROWTH TO DATE CULTURE WILL BE HELD FOR 5 DAYS BEFORE ISSUING A FINAL NEGATIVE REPORT Performed at [facility]    Report Status PENDING  Incomplete  Culture, blood (routine x 2)     Status: None (Preliminary result)   Collection Time: 05/10/14  8:17 PM  Result Value Ref Range Status   Specimen Description BLOOD RIGHT HAND  Final   Special Requests BOTTLES DRAWN AEROBIC AND ANAEROBIC 5CC  Final   Culture   Final           BLOOD CULTURE RECEIVED NO GROWTH TO DATE CULTURE WILL BE HELD FOR 5 DAYS BEFORE ISSUING A FINAL NEGATIVE REPORT Performed at [facility]    Report Status PENDING  Incomplete  Urine culture     Status: None   Collection Time: 05/10/14  8:37 PM  Result Value Ref Range Status   Specimen Description URINE, RANDOM  Final   Special Requests NONE  Final   Colony Count   Final  40,000 COLONIES/ML Performed at [facility]   Final    Multiple bacterial morphotypes present, none predominant. Suggest appropriate recollection if clinically indicated. Performed at [facility]    Report Status 05/12/2014 FINAL  Final  MRSA PCR Screening     Status: None   Collection Time: 05/11/14 11:29 AM  Result Value Ref Range Status   MRSA by PCR NEGATIVE NEGATIVE Final    Comment:        The GeneXpert MRSA Assay (FDA approved for NASAL specimens only), is one component of a comprehensive MRSA colonization surveillance program. It is not intended to diagnose MRSA infection nor to guide or monitor treatment for MRSA infections.   Culture, blood (routine x 2)     Status: None (Preliminary result)   Collection Time: 05/13/14  2:30 AM  Result Value Ref Range Status   Specimen Description BLOOD LEFT ARM  Final   Special Requests BOTTLES DRAWN AEROBIC AND ANAEROBIC 5CC EACH  Final   Culture   Final           BLOOD CULTURE RECEIVED NO GROWTH TO DATE CULTURE WILL BE HELD FOR 5 DAYS  BEFORE ISSUING A FINAL NEGATIVE REPORT Performed at [facility]    Report Status PENDING  Incomplete  Culture, blood (routine x 2)     Status: None (Preliminary result)   Collection Time: 05/13/14  2:45 AM  Result Value Ref Range Status   Specimen Description BLOOD LEFT HAND  Final   Special Requests BOTTLES DRAWN AEROBIC AND ANAEROBIC 5CC EACH  Final   Culture   Final           BLOOD CULTURE RECEIVED NO GROWTH TO DATE CULTURE WILL BE HELD FOR 5 DAYS BEFORE ISSUING A FINAL NEGATIVE REPORT Performed at [facility]    Report Status PENDING  Incomplete   Studies/Results: Ct Chest W Contrast  05/13/2014   CLINICAL DATA:  Hemoptysis. Cough. Right upper lobe consolidation on chest radiograph. COPD. Congestive heart failure.  EXAM: CT CHEST WITH CONTRAST  TECHNIQUE: Multidetector CT imaging of the chest was performed during intravenous contrast administration.  CONTRAST:  174mL OMNIPAQUE IOHEXOL 300 MG/ML  SOLN  COMPARISON:  Chest radiograph on 05/13/2014 and previous chest CT on 09/25/2010  FINDINGS: Mediastinum/Lymph Nodes: Mild lymphadenopathy is seen in the mediastinum and bilateral hilar regions, with largest index lymph node in the right paratracheal region measuring 14 mm on image 19/16 2. This is nonspecific but likely reactive in etiology.  Lungs/Pleura: Mild emphysema is noted. Pulmonary airspace disease is seen with predominant involvement of the right upper lobe, but also lesser involvement of the lower lobes, and minimal involvement of the right middle lobe and lingula. This is suspicious for pneumonia with asymmetric edema considered less likely. No evidence of pleural effusion  Musculoskeletal/Soft Tissues: No suspicious bone lesions or other significant chest wall abnormality. Incompletely healed but benign-appearing fracture of the left lateral sixth rib is seen, which demonstrates some callus formation.  Upper Abdomen: Multiple calcified gallstones noted without evidence  of cholecystitis.  IMPRESSION: Bilateral multilobar airspace disease, with greatest involvement of the right upper lobe, suspicious for pneumonia.  Shotty mediastinal and bilateral hilar lymphadenopathy, likely reactive in etiology. Continued followup by chest CT with contrast recommended in 3 months.  No evidence of pleural effusion.  Incompletely healed left lateral sixth rib fracture incidentally noted.   Electronically Signed   By: Earle Gell M.D.   On: 05/13/2014 13:06  Dg Chest Port 1 View  05/15/2014   CLINICAL DATA:  Acute respiratory failure.  EXAM: PORTABLE CHEST - 1 VIEW  COMPARISON:  May 13, 2014.  FINDINGS: Stable cardiomediastinal silhouette. No pneumothorax or significant pleural effusion is noted. Increased mixed interstitial and airspace opacities are noted throughout both lungs, particularly in the right lung. This is consistent with worsening pneumonia or possibly edema.  IMPRESSION: Increased bilateral lung opacities are noted, with right worse than left. This is concerning for worsening pneumonia or edema. Followup radiographs are recommended.   Electronically Signed   By: Marijo Conception, M.D.   On: 05/15/2014 09:05   Medications:  Scheduled Meds: . antiseptic oral rinse  7 mL Mouth Rinse BID  . azithromycin  500 mg Intravenous Q24H  . budesonide (PULMICORT) nebulizer solution  0.25 mg Nebulization BID  . busPIRone  10 mg Oral TID  . ceFEPime (MAXIPIME) IV  2 g Intravenous Q12H  . diltiazem  420 mg Oral Daily  . FLUoxetine  60 mg Oral Daily  . guaiFENesin  600 mg Oral BID  . ipratropium  0.5 mg Nebulization Q4H  . levalbuterol  0.63 mg Nebulization Q4H  . oseltamivir  75 mg Oral BID  . pantoprazole  40 mg Oral Daily  . vancomycin  1,000 mg Intravenous Q12H   Continuous Infusions:  PRN Meds:.diazepam, HYDROcodone-acetaminophen, white petrolatum   Assessment/Plan:Kelsey Garcia is a 69 yo female with a PMH significant for gold stage III COPD, atrial fibrillation,  possibleHFpEF, OCD, GERD, osteoarthritis, HTN and hyperlipidemia who was admitted for SOB, wheezing, tachycardia, fevers, now found to be influenza positive, have likely pneumonia and atrial fibrillation with RVR.   Principal Problem:   HCAP (healthcare-associated pneumonia) Active Problems:   HLD (hyperlipidemia)   Atrial fibrillation with rapid ventricular response   COPD (chronic obstructive pulmonary disease)   Osteoarthritis   Anxiety   Long term current use of opiate analgesic   Chronic diastolic CHF (congestive heart failure)   Influenza due to influenza A virus   Pancytopenia   Respiratory acidosis   Hemoptysis   Mediastinal and hilar lymphadenopathy on CT   Fracture of rib of left side with delayed healing  ** SOB, wheezing, tachycardia, fevers: Febrile and tachycardic on admission with symptoms of SOB, wheezing, non-productive cough, increased oxygen requirement. There was initial concern for possible CAP with a CXR showing ill-defined supra-hilar opacity. She was found to be influenza A positive, and initial antibiotics ceftriaxone and doxycycline were stopped 4/1. With hemoptysis and new RUL consolidation seen on CXR 4/3, suspicion for HCAP was raised and she started on vancomycin/cefepime 4/2. She began to develop worsening hypoxia and required partial NRB overnight on 4/4. S/p 80 mg IV solumedrol 4/4. Fatigue this AM may be due to excessive oxygen supplementation in the setting of chronic hypercapnia.  -- ABG this AM was 7.35/56.1/70.2/30.3. Will try to adjust goal sat from >92 to >88.  -- CXR this AM concerning for possible development of ARDS vs diffuse pulmonary edema  -- Will give lasix IV 40 mg  -- Continue tamiflu (day 5/5) -- On vancomycin/cefepime (started: 4/2), azithromycin (started: 4/4) -- Blood cultures from 3/31 NGTD, blood cultures from 4/3 also NGTD, still pending -- HIV antibody negative, legionella urinary antigen negative, Strep pneumoniae urinary antigen  negative  ** Anemia, thrombocytopenia: OA, normal hemoglobin of 13.5, dropping to 9-10 over the course of admission. The patient admits to bleeding symptoms of hemoptysis and bleeding from hemorrhoids. Heparin gtt started for atrial fibrillation  with RVR was discontinued on 4/2. This appears to be acute during the setting of this hospitalization; differential includes blood loss due to hemoptysis or GI losses. HITT is unlikely as 4T score is 2. Her anemia and thrombocytopenia could be due to blood loss, though continue to down-trend in the setting of improvement in reported hemoptysis. This could be due to systemic inflammation with PNA vs related to drugs, most notably vancomycin.  MCV is 98-100. B12 and folate during this admission are normal.  -- Continue to monitor CBC -- Will obtain smear with next lab draw  ** Atrial fibrillation with RVR: Tachycardic to 140s on admission in the setting of missing home diltiazem. S/p diltiazem drip, now on home po dose. Some improvement in rate over the past 2 days with intermittent tachycardia. TSH mildly low at 0.293. T4 WNL at 1.04.  -- Monitor on telemetry -- Off heparin drip due to declining hemoglobin -- Home diltiazem (tiazac 24 hr capsule) 420 mg daily -- Hemoglobin A1c 5.7, sightly elevated with no history of diabetes, will likely need follow- up as an outpatient -- Stopping IVF today with advanced diet and evidence of pulmonary edema  ** COPD: GOLD stage III.  -- Continue Symbicort 2 puffs twice daily -- Continue DuoNebs every 4 hours -- Antiseptic oral rinse with steroid inhalers  ** HFpEF: Unclear history of CHF.  -- Continue Aspirin 81 mg -- Lasix as above; holding home lasix   ** Hypertension: Appears well controlled.  -- Continue home dilltiazem  ** Hyperlipidemia: Lipid panel 10/12/2013 notable for total cholesterol 194, HDL 52, LDL 127, triglycerides 76. She is not on any statin therapy. -- Defer starting statin therapy as  outpatient  ** Osteoarthritis: Complex pain management with previously being discharged from our clinic for becoming verbally abusive when discussing pain medications.  -- With relative hypotension, are giving lower than home dose of norco at 5-325 mg Q6 PRN -- Provide her with resources to establish PCP care elsewhere  ** OCD: She follows with a therapist at [facility]. Currently stated mood of panic could certainly be contributed to by solumedrol; however, value of steroids in treating her respiratory status likely outweighs side effects.  -- Will CTM need for increased anxiolytics with steroids; will need to weigh with possible worsened hypotension -- Continue Buspar 10 mg 3 times daily -- Re-started home valium 2Q 12 PRN  -- Continue Prozac 60 mg  **FEN/GI: -- Diet: Advanced to heart diet -- Hypokalemic on admission, 3.9 today  ** PPx:  -- DVT: off heparin as above, SCDs -- Home pantoprazole  ** Dispo:  -- Full code -- Will need resources to establish PCP prior to discharge -- Will need cardiology follow-up prior to discharge   This is a Careers information officer Note.  The care of the patient was discussed with Dr. Eula Fried and the assessment and plan formulated with their assistance.  Please see their attached note for official documentation of the daily encounter.   LOS: 4 days   Kelsey Garcia, Med Student 05/15/2014, 10:33 AM  [phone]

## 2014-05-15 NOTE — Progress Notes (Signed)
Physical Therapy Treatment Patient Details Name: Kelsey Garcia MRN: [id number] DOB: 10/08/1945 Today's Date: 06-07-2014    History of Present Illness 69 y.o. female, influenza A positive and Afib with RVR.    PT Comments    Pt progressing with mobility able to transfer OOB to chair today with cueing and assist. Pt denied attempting bil LE HEP, gait or further activity. On partial rebreather pt maintained sats 90-97% throughout with HR 104. Pt benefits from encouragement. Will continue to follow.   Follow Up Recommendations  Home health PT     Equipment Recommendations  None recommended by PT    Recommendations for Other Services       Precautions / Restrictions Precautions Precautions: Fall Precaution Comments: Monitor HR and O2 sats Restrictions Weight Bearing Restrictions: No    Mobility  Bed Mobility Overal bed mobility: Modified Independent             General bed mobility comments: with rail and increased time with pausing x 2 to catch her breath  Transfers Overall transfer level: Needs assistance   Transfers: Sit to/from Stand Sit to Stand: Supervision         General transfer comment: cues for hand placement with pt able to stand from bed and recliner with assist for lines, grossly 15 sec each trial  Ambulation/Gait Ambulation/Gait assistance:  (pt denied attempting)               Stairs            Wheelchair Mobility    Modified Rankin (Stroke Patients Only)       Balance                                    Cognition Arousal/Alertness: Awake/alert Behavior During Therapy: WFL for tasks assessed/performed;Anxious Overall Cognitive Status: Within Functional Limits for tasks assessed                      Exercises      General Comments        Pertinent Vitals/Pain Pain Assessment: No/denies pain    Home Living                      Prior Function            PT Goals (current goals  can now be found in the care plan section) Progress towards PT goals: Progressing toward goals (slowly)    Frequency       PT Plan Discharge plan needs to be updated    Co-evaluation             End of Session Equipment Utilized During Treatment: Oxygen Activity Tolerance: Patient limited by fatigue;Treatment limited secondary to medical complications (Comment) Patient left: in chair;with call bell/phone within reach;with nursing/sitter in room     Time: 9169-4503 PT Time Calculation (min) (ACUTE ONLY): 18 min  Charges:  $Therapeutic Activity: 8-22 mins                    G Codes:      Melford Aase 2014/06/07, 9:50 AM Elwyn Reach, [place]

## 2014-05-15 NOTE — Progress Notes (Signed)
PULMONARY / CRITICAL CARE MEDICINE   Name: Kelsey Garcia MRN: [id number] DOB: [birth year]    ADMISSION DATE:  05/10/2014 CONSULTATION DATE: 05/15/2014  REFERRING MD :    CHIEF COMPLAINT:  Increasing oxygen requirements  INITIAL PRESENTATION: 69 year-old female with history of COPD, afib, chronic diastolic HF, HTN admitted 3/31 with SOB, wheezing, and afib with RVR, found to be positive for  influenza A. New right upper lobe infiltrate 4/3 concerning for pneumonia. Now with bilateral progressive airspace disease and worsening hypoxia with escalating 02 requirements concerning for ARDS. PCCM consulted for potential need for intubation.   STUDIES:  Chest CT 4/3 - bilateral multilobar airspace disease with greatest involvement of right upper lobe suspicious for pneumonia  CXR - increased bilateral lung opacities, right worse than left   SIGNIFICANT EVENTS:    HISTORY OF PRESENT ILLNESS:  69 year-old female with history of COPD (2L home 02), afib, chronic diastolic HF, HTN, OCD, GERD, OA admitted 3/31 after 4 days history of worsening SOB and associated wheezing, in afib with RVR (ran out of home diltiazem) and febrile to 103.69F. (Of note, recently seen in ED 3/19 for SOB, treated with albuterol & steroids, sent home.) Influenza A positive 4/1, received course of tamiflu. Diuresis stopped 4/2 for hypotension. Increasing oxygen requirements 4/3 with sepsis secondary to CAP. Hemoptysis 4/3, chest CT obtained showing bilateral multilobar airspace disease worst in RUL suspicious for pneumonia. Placed on bipap with improvement in pH & C02. Did not tolerate bipap due to anxiety, transitioned to non-rebreather. CXR 4/5 with worsening bilateral opacities R>L, PF ratio worsening concerning for ARDS.    She  has a past medical history of Atrial fibrillation; Hypertension; Insomnia; Nonischemic cardiomyopathy; Hyperlipidemia; Anxiety; Cholelithiasis; Insomnia; Mitral regurgitation; H/O epistaxis; Rhinitis,  allergic; CHF (congestive heart failure); Pneumonia; COPD (chronic obstructive pulmonary disease); Chronic bronchitis with COPD (chronic obstructive pulmonary disease); Shortness of breath; GERD (gastroesophageal reflux disease); Headache(784.0); Migraines; DJD (degenerative joint disease); Arthritis; OCD (obsessive compulsive disorder); OCD (obsessive compulsive disorder); and Depression.  She  has past surgical history that includes Tubal ligation (4098) and Cardioversion (2003; 07/2003).  Her family history includes Allergies in an other family member; Asthma in an other family member; Cancer in an other family member; Emphysema in an other family member.  She  reports that she has been smoking Cigarettes.  She has a 35 pack-year smoking history. She has never used smokeless tobacco. She reports that she does not drink alcohol or use illicit drugs.  No current facility-administered medications on file prior to encounter.   Current Outpatient Prescriptions on File Prior to Encounter  Medication Sig Dispense Refill  . albuterol (PROVENTIL HFA;VENTOLIN HFA) 108 (90 BASE) MCG/ACT inhaler Inhale 1-2 puffs into the lungs every 6 (six) hours as needed for wheezing. 1 Inhaler 0  . aspirin 325 MG tablet Take 650 mg by mouth daily.     . budesonide-formoterol (SYMBICORT) 160-4.5 MCG/ACT inhaler Inhale 2 puffs into the lungs 2 (two) times daily. 10.2 g 11  . chlorpheniramine (CHLOR-TRIMETON) 4 MG tablet Take 1 tablet (4 mg total) by mouth every 8 (eight) hours as needed for allergies. 90 tablet 0  . diazepam (VALIUM) 2 MG tablet Take 1 tablet (2 mg total) by mouth every 12 (twelve) hours as needed for anxiety. 60 tablet 3  . diltiazem (TIAZAC) 420 MG 24 hr capsule Take 1 capsule (420 mg total) by mouth daily. 90 capsule 0  . FLUoxetine (PROZAC) 20 MG capsule TAKE THREE CAPSULES BY MOUTH  ONCE DAILY (Patient taking differently: Take 60 mg by mouth. TAKE THREE CAPSULES BY MOUTH ONCE DAILY) 90 capsule 5  .  furosemide (LASIX) 40 MG tablet TAKE TWO TABLETS BY MOUTH TWICE DAILY 120 tablet 5  . HYDROcodone-acetaminophen (NORCO) 7.5-325 MG per tablet Take 1 tablet by mouth every 6 (six) hours as needed. 120 tablet 0  . HYDROcodone-homatropine (HYCODAN) 5-1.5 MG/5ML syrup Take 5 mLs by mouth every 6 (six) hours as needed for cough. 120 mL 0  . nystatin (MYCOSTATIN) 100000 UNIT/ML suspension Take 5 mLs (500,000 Units total) by mouth as needed. (Patient taking differently: Take 500,000 Units by mouth as needed (thrush). ) 60 mL 0  . pantoprazole (PROTONIX) 40 MG tablet TAKE ONE TABLET BY MOUTH TWICE DAILY 60 tablet 5  . potassium chloride SA (K-DUR,KLOR-CON) 20 MEQ tablet Take 0.5 tablets (10 mEq total) by mouth 2 (two) times daily. (Patient taking differently: Take 60 mEq by mouth daily. ) 30 tablet 6  . tiotropium (SPIRIVA) 18 MCG inhalation capsule Place 1 capsule (18 mcg total) into inhaler and inhale daily. 90 capsule 4  . zolpidem (AMBIEN) 5 MG tablet Take 1 tablet (5 mg total) by mouth at bedtime as needed for sleep. 30 tablet 3  . guaiFENesin (MUCINEX) 600 MG 12 hr tablet Take 1 tablet (600 mg total) by mouth 2 (two) times daily. 20 tablet 0  . levofloxacin (LEVAQUIN) 500 MG tablet Take 1 tablet (500 mg total) by mouth daily. (Patient not taking: Reported on 05/10/2014) 10 tablet 0  . predniSONE (DELTASONE) 20 MG tablet Take 1 tablet (20 mg total) by mouth daily with breakfast. (Patient not taking: Reported on 05/10/2014) 10 tablet 0   Allergies  Allergen Reactions  . Ace Inhibitors Cough  . Diphenhydramine Hcl Other (See Comments)    "feels like my skin is crawling, and legs twitches"  . Erythromycin Diarrhea  . Nsaids Nausea And Vomiting  . Nyquil [Pseudoeph-Doxylamine-Dm-Apap] Itching  . Tramadol Hcl Other (See Comments)    stomach pain, hallucinations  . Flexeril [Cyclobenzaprine] Anxiety  . Pseudoephedrine Palpitations   ROS: Negative except above  SUBJECTIVE:  Anxious female with +  accessory muscle use.   VITAL SIGNS: Temp:  [97.6 F (36.4 C)-98 F (36.7 C)] 97.9 F (36.6 C) (04/05 0731) Pulse Rate:  [104] 104 (04/05 0947) Resp:  [20-31] 20 (04/05 1000) BP: (99-131)/(54-79) 129/79 mmHg (04/05 0731) SpO2:  [83 %-100 %] 94 % (04/05 1000) FiO2 (%):  [55 %-97 %] 55 % (04/05 0915) HEMODYNAMICS:   VENTILATOR SETTINGS: Vent Mode:  [-]  FiO2 (%):  [55 %-97 %] 55 % INTAKE / OUTPUT:  Intake/Output Summary (Last 24 hours) at 05/15/14 1114 Last data filed at 05/15/14 0800  Gross per 24 hour  Intake   2300 ml  Output    275 ml  Net   2025 ml    PHYSICAL EXAMINATION: General:  Chronically ill-apprearing female  Neuro:  Awake, no focal deficits  HEENT:  NCAT.  Cardiovascular: No MGR. Afib on bedside monitor.  Lungs: bilateral rales, right worse than left  Abdomen: soft, nontender. Bowel sounds present.  Musculoskeletal:  Grossly intact. +2 edema BLE.  Skin:  Scattered bruises over bilateral arms and hands.   LABS:  CBC  Recent Labs Lab 05/13/14 1100 05/14/14 0300 05/15/14 0344  WBC 3.6* 3.4* 2.7*  HGB 10.4* 9.9* 10.8*  HCT 33.8* 31.6* 34.1*  PLT 163 142* 125*   Coag's No results for input(s): APTT, INR in the last 168 hours.  BMET  Recent Labs Lab 05/13/14 1100 05/14/14 0300 05/15/14 0344  NA 137 138 139  K 4.0 3.8 3.9  CL 101 102 103  CO2 31 30 30   BUN 10 7 6   CREATININE 0.66 0.60 0.49*  GLUCOSE 127* 131* 136*   Electrolytes  Recent Labs Lab 05/13/14 0111 05/13/14 1100 05/14/14 0300 05/15/14 0344  CALCIUM  --  8.0* 7.9* 8.5  MG 2.0  --   --   --    Sepsis Markers  Recent Labs Lab 05/10/14 2028 05/14/14 0300  LATICACIDVEN 1.27  --   PROCALCITON  --  <0.10   ABG  Recent Labs Lab 05/13/14 1633 05/13/14 2030 05/15/14 0840  PHART 7.415 7.349* 7.350  PCO2ART 46.8* 56.4* 56.1*  PO2ART 135.0* 96.4 70.2*   Liver Enzymes  Recent Labs Lab 05/10/14 2005 05/15/14 0344  AST 18 29  ALT 15 14  ALKPHOS 101 62  BILITOT  0.6 0.8  ALBUMIN 3.0* 2.4*   Cardiac Enzymes No results for input(s): TROPONINI, PROBNP in the last 168 hours. Glucose  Recent Labs Lab 05/14/14 2335 05/15/14 0553  GLUCAP 120* 138*    Imaging No results found.   ASSESSMENT / PLAN:  PULMONARY Acute on chronic respiratory failure in setting of COPD and CAP  ARDS Hemoptysis  P:   Continue budesonide, mucinex, atrovent, levalbuterol Diurese as BP & creatinine allow Continue 02 via facemask to maintain Sp02 >92% (patient refuses bipap) Possible intubation  CARDIOVASCULAR Afib - currently rate controlled Chronic diastolic heart failure  H/o HTN P:  Continue diltiazem  Maintain MAP >65  RENAL Hypokalemia  P:   Replace electrolytes as needed. Trend BMP.   GASTROINTESTINAL No active issues  P:   Stress ulcer prophylaxis - pantoprazole NPO for possible intubation  HEMATOLOGIC Leukopenia  Mild anemia  Thrombocytopenia  P:  Trend CBC  VTE prophylaxis - SCDs   INFECTIOUS Sepsis in setting of CAP and influenza P:   BCx2 4/3 >> BC x 2 3/31 >> no growth UC 4/3 >> multiple bacterial morphotypes ? contaminate  Azithromycin 4/4 >> cefipime 4/3 >> Vancomycin 4/3 >> oseltamivir 4/1 - 4/5   ENDOCRINE No acute issues P:   Trend glucose   NEUROLOGIC Anxiety  Chronic pain  P:   Continue diazepam, buspar, and vicodin     FAMILY  - Updates:   - Inter-disciplinary family meet or Palliative Care meeting due by: 4/12    TODAY'S SUMMARY: 69 year-old female with h/o COPD admitted for CAP,  Influenza A +, with secondary CAP, sepsis, and now ARDS. Increasing oxygen requirements, does not tolerate bipap due to anxiety.       [facility] [place] Pager: [phone]  05/15/2014, 11:14 AM   Reviewed above, examined.  69 yo female smoker with hx of COPD on home oxygen presented with progressive dyspnea, wheezing, cough, and fever.  She was found to have PNA and  Influenza A positive.   She feels anxious and this makes her more short of breath.  She has cough, but not much sputum.    She is anxious.  Heart sounds irregular.  She has Rt > Lt crackles, but no wheeze.    Will continues supplemental oxygen >> she did not tolerate BiPAP.  No need for intubation at this time >> will need to monitor respiratory status.  Continue Abx, tamiflu.  Will adjust anxiety/pain regimen.  F/u CXR.  Updated pt's daughter at bedside.  Chesley Mires, MD [facility] Pulmonary/Critical  Care 05/15/2014, 4:39 PM Pager:  [phone] After 3pm call: [phone]

## 2014-05-16 ENCOUNTER — Inpatient Hospital Stay (HOSPITAL_COMMUNITY): Payer: Medicare Other

## 2014-05-16 DIAGNOSIS — R000 Tachycardia, unspecified: Secondary | ICD-10-CM

## 2014-05-16 DIAGNOSIS — J9602 Acute respiratory failure with hypercapnia: Secondary | ICD-10-CM

## 2014-05-16 LAB — SAVE SMEAR

## 2014-05-16 LAB — CBC
HCT: 31.9 % — ABNORMAL LOW (ref 36.0–46.0)
Hemoglobin: 10.8 g/dL — ABNORMAL LOW (ref 12.0–15.0)
MCH: 31.8 pg (ref 26.0–34.0)
MCHC: 33.9 g/dL (ref 30.0–36.0)
MCV: 93.8 fL (ref 78.0–100.0)
Platelets: 241 10*3/uL (ref 150–400)
RBC: 3.4 MIL/uL — AB (ref 3.87–5.11)
RDW: 15 % (ref 11.5–15.5)
WBC: 8 10*3/uL (ref 4.0–10.5)

## 2014-05-16 LAB — VANCOMYCIN, TROUGH: Vancomycin Tr: 16.9 ug/mL (ref 10.0–20.0)

## 2014-05-16 LAB — GLUCOSE, CAPILLARY
GLUCOSE-CAPILLARY: 124 mg/dL — AB (ref 70–99)
GLUCOSE-CAPILLARY: 186 mg/dL — AB (ref 70–99)

## 2014-05-16 LAB — BASIC METABOLIC PANEL
Anion gap: 7 (ref 5–15)
BUN: 7 mg/dL (ref 6–23)
CALCIUM: 8.5 mg/dL (ref 8.4–10.5)
CO2: 34 mmol/L — AB (ref 19–32)
CREATININE: 0.53 mg/dL (ref 0.50–1.10)
Chloride: 98 mmol/L (ref 96–112)
GFR calc Af Amer: >90 mL/min (ref >90)
GLUCOSE: 98 mg/dL (ref 70–99)
Potassium: 3.8 mmol/L (ref 3.5–5.1)
Sodium: 139 mmol/L (ref 135–145)

## 2014-05-16 LAB — BRAIN NATRIURETIC PEPTIDE: B Natriuretic Peptide: 145 pg/mL — ABNORMAL HIGH (ref 0.0–100.0)

## 2014-05-16 LAB — MAGNESIUM: Magnesium: 2.1 mg/dL (ref 1.5–2.5)

## 2014-05-16 LAB — URIC ACID: Uric Acid, Serum: 3.4 mg/dL (ref 2.4–7.0)

## 2014-05-16 MED ORDER — METHYLPREDNISOLONE SODIUM SUCC 40 MG IJ SOLR
40.0000 mg | Freq: Three times a day (TID) | INTRAMUSCULAR | Status: DC
  Administered 2014-05-16 – 2014-05-17 (×4): 40 mg via INTRAVENOUS
  Filled 2014-05-16 (×7): qty 1

## 2014-05-16 MED ORDER — DIAZEPAM 2 MG PO TABS
2.0000 mg | ORAL_TABLET | Freq: Once | ORAL | Status: AC
  Administered 2014-05-16: 2 mg via ORAL

## 2014-05-16 MED ORDER — FUROSEMIDE 10 MG/ML IJ SOLN
40.0000 mg | Freq: Once | INTRAMUSCULAR | Status: AC
  Administered 2014-05-16: 40 mg via INTRAVENOUS
  Filled 2014-05-16: qty 4

## 2014-05-16 MED ORDER — IPRATROPIUM-ALBUTEROL 0.5-2.5 (3) MG/3ML IN SOLN
3.0000 mL | Freq: Four times a day (QID) | RESPIRATORY_TRACT | Status: DC
  Administered 2014-05-16 – 2014-05-19 (×15): 3 mL via RESPIRATORY_TRACT
  Filled 2014-05-16 (×16): qty 3

## 2014-05-16 MED ORDER — ALBUTEROL SULFATE (2.5 MG/3ML) 0.083% IN NEBU: 2.5000 mg | INHALATION_SOLUTION | RESPIRATORY_TRACT | Status: DC | PRN

## 2014-05-16 MED ORDER — FUROSEMIDE 80 MG PO TABS
80.0000 mg | ORAL_TABLET | Freq: Two times a day (BID) | ORAL | Status: DC
  Administered 2014-05-17 – 2014-05-21 (×10): 80 mg via ORAL
  Filled 2014-05-16 (×9): qty 1
  Filled 2014-05-16: qty 2
  Filled 2014-05-16 (×3): qty 1

## 2014-05-16 NOTE — Progress Notes (Signed)
        Subjective:   Pt reports her breathing is better today but still has increased WOB.  VS stable.     Objective:   Filed Vitals:   05/16/14 1100  BP:   Pulse:   Temp:   Resp: 23   Physical Exam: Constitutional: Vital signs reviewed. Patient is lying in bed in NAD.    HEENT: [place]/AT; EOMI, conjunctivae normal, no scleral icterus  Cardiovascular: irregularly irregular rhythm, no MRG Pulmonary/Chest: increased respiratory effort, decreased breath sounds with diffuse crackles and  wheezing.     Abdominal: Soft. +BS, NT/ND Neurological: A&O x3, CN II-XII grossly intact; non-focal exam Extremities: 2+DP b/l, trace pitting edema b/l   Skin: Warm, dry. Multiple excoriations.    Assessment/Plan:   Principal Problem:  Influenza due to influenza A virus Active Problems:  HLD (hyperlipidemia)  Atrial fibrillation with rapid ventricular response  COPD (chronic obstructive pulmonary disease)  Osteoarthritis  Long term current use of opiate analgesic   ?HCAP vs. viral PNA vs. ARDS with acute on chronic hypercarbic respiratory failure- Pt reports breathing has improved.  CXR this AM showed severe b/l pulmonary infiltrates c/w bilateral PNA and or/pulmonary edema.  BNP was only in the 100s and TTE had LVEF of 50-55% on 03/07/14.  Satting in the 90s on NRB.   -cont vanc/cefepime, azithromycin -will repeat BNP, CXR -cont duonebs, IV solumedrol -IV lasix 40mg  x 1  -keep sats 88-92%   Influenza- Pt influenza A positive.  -completed 5 day course of tamiflu.   -cont symptomatic tx  Hypotension- Improving.   -monitor  Hemoptysis- Improved.  Hgb stable. -cont monitoring H/H  COPD- -continue current meds -plans per above   Afib with RVR- HR improved--in the 80s this AM.  Likely exacerbated by influenza, ?PNA, albuterol, and continued lasix, and will likely improve with resolution of infection. She refuses anticoagulation.   -cont cardizem -monitor on  telemetry  Right shoulder pain- -cont tylenol 650mg   -kpad  Disposition Disposition is deferred, awaiting improvement of current medical problems. Anticipated discharge in approximately 1-2 day(s).   Jones Bales, MD PGY-2, Internal Medicine Teaching Service 11:29 AM

## 2014-05-16 NOTE — Progress Notes (Signed)
PULMONARY / CRITICAL CARE MEDICINE   Name: Kelsey Garcia MRN: [id number] DOB: 28-Oct-1945    ADMISSION DATE:  05/10/2014 CONSULTATION DATE: 05/15/2014  REFERRING MD :    CHIEF COMPLAINT:  Increasing oxygen requirements  INITIAL PRESENTATION:  69 yo female admitted with dyspnea, wheeze from Influenza A PNA and a fib with RVR.  She has hx of COPD, A fib, chronic diastolic CHF, HTN, and anxiety.  STUDIES:  Chest CT 4/3 - bilateral multilobar airspace disease with greatest involvement of right upper lobe suspicious for pneumonia   SUBJECTIVE:   Slept better last night.  Feels that morphine helped.    VITAL SIGNS: Temp:  [97.4 F (36.3 C)-98.2 F (36.8 C)] 97.6 F (36.4 C) (04/06 0325) Pulse Rate:  [86-104] 86 (04/05 2035) Resp:  [15-32] 15 (04/06 0325) BP: (97-140)/(42-79) 120/52 mmHg (04/06 0325) SpO2:  [83 %-99 %] 95 % (04/06 0334) FiO2 (%):  [55 %-100 %] 100 % (04/05 1600) INTAKE / OUTPUT:  Intake/Output Summary (Last 24 hours) at 05/16/14 0733 Last data filed at 05/16/14 0600  Gross per 24 hour  Intake    620 ml  Output   2415 ml  Net  -1795 ml    PHYSICAL EXAMINATION: General: no distress Neuro: normal strength HEENT: no sinus tenderness Cardiovascular: irregular Lungs: b/l crackles with faint wheeze  Abdomen: soft, nontender Musculoskeletal: 1+ edema Skin: multiple areas of ecchymosis  LABS:  CBC  Recent Labs Lab 05/14/14 0300 05/15/14 0344 05/16/14 0305  WBC 3.4* 2.7* 8.0  HGB 9.9* 10.8* 10.8*  HCT 31.6* 34.1* 31.9*  PLT 142* 125* 241   BMET  Recent Labs Lab 05/14/14 0300 05/15/14 0344 05/16/14 0305  NA 138 139 139  K 3.8 3.9 3.8  CL 102 103 98  CO2 30 30 34*  BUN 7 6 7   CREATININE 0.60 0.49* 0.53  GLUCOSE 131* 136* 98   Electrolytes  Recent Labs Lab 05/13/14 0111  05/14/14 0300 05/15/14 0344 05/16/14 0305  CALCIUM  --   < > 7.9* 8.5 8.5  MG 2.0  --   --   --  2.1  < > = values in this interval not displayed.   Sepsis  Markers  Recent Labs Lab 05/10/14 2028 05/14/14 0300  LATICACIDVEN 1.27  --   PROCALCITON  --  <0.10   ABG  Recent Labs Lab 05/13/14 1633 05/13/14 2030 05/15/14 0840  PHART 7.415 7.349* 7.350  PCO2ART 46.8* 56.4* 56.1*  PO2ART 135.0* 96.4 70.2*   Liver Enzymes  Recent Labs Lab 05/10/14 2005 05/15/14 0344  AST 18 29  ALT 15 14  ALKPHOS 101 62  BILITOT 0.6 0.8  ALBUMIN 3.0* 2.4*   Glucose  Recent Labs Lab 05/14/14 2335 05/15/14 0553 05/15/14 1143 05/15/14 1827  GLUCAP 120* 138* 128* 182*    Imaging Dg Chest Port 1 View  05/15/2014   CLINICAL DATA:  Acute respiratory failure.  EXAM: PORTABLE CHEST - 1 VIEW  COMPARISON:  May 13, 2014.  FINDINGS: Stable cardiomediastinal silhouette. No pneumothorax or significant pleural effusion is noted. Increased mixed interstitial and airspace opacities are noted throughout both lungs, particularly in the right lung. This is consistent with worsening pneumonia or possibly edema.  IMPRESSION: Increased bilateral lung opacities are noted, with right worse than left. This is concerning for worsening pneumonia or edema. Followup radiographs are recommended.   Electronically Signed   By: Marijo Conception, M.D.   On: 05/15/2014 09:05     ASSESSMENT / PLAN: Blood 3/31 >>  Influenza 4/01 >> Positive for Influenza A Blood 4/03 >>  Acute on chronic respiratory failure from influenza PNA and sepsis >> completed tamiflu 5/05. Hx of COPD. P:   D/c pulmicort Add solumedrol 4/06 Continue duoneb >> change to q6h F/u CXR Bronchial hygiene Oxygen to keep SpO2 88 to 95% Lasix 40 mg IV x one 5/06 Day 7 of Abx, currently on vancomycin, cefepime, zithromax  Afib with RVR. Chronic diastolic heart failure. P:  Continue cardizem Anticoagulation per primary team  Hx of Anxiety, and chronic pain  P:   Continue diazepam, buspar, and vicodin  PRN morphine  SUMMARY: Respiratory status and CXR stable.  No indication for intubation at this  time.  She did not tolerate BiPAP.  Chesley Mires, MD [facility] Pulmonary/Critical Care 05/16/2014, 7:46 AM Pager:  [phone] After 3pm call: [phone]

## 2014-05-16 NOTE — Progress Notes (Signed)
Patient seen and examined. Case d.w residents in detail. I agree with findings and plan as documented in Dr. Ferd Glassing note.  Patient states she feels better but still with increased work of breathing. No fevers. She is now tolerating 55% FiO2 via VM with sats in the 90s. Still with b/l crackles and scattered wheezes. Pulm follow up appreciated. C/w solumedrol, nebs and abx for now. Repeat CXR in AM. Would give an additional 40 mg IV lasix today.   Patient completed course of tamiflu for + influenza A  Afib is rate controlled today. Will monitor on cardizem.

## 2014-05-16 NOTE — Progress Notes (Signed)
Called to bedside by RT. Patient found sitting on side of bed with Ventimask, increase work of breathing present with accessory muscles noted. Oxygen saturation on monitor 98%. Heart rate Afib in the low 100's. Patient reports anxiety and "I cannot breath". Neb treatment given by RT. Assessed patient's lungs. Clear in the upper lobes and diminished in lower lobes with scant rhonchi. Rest of assessment unchanged from [facility] assessment in doc flow sheet. Valium 2 mg PO given to patient. Stood at bedside with patient and patient's younger brother Thayer Jew. Active listening , patient verbalized feelings of "doom" and frustration concerning her present condition. Encouragement given. Discussed purse lip breathing technique to help with the feeling of SOB. Patient demonstrated action. Verbalized that it improved condition. Dr. Dareen Piano came to bedside . Gave update on patient's current condition. She reassessed patient and actively listen to the pateint's concerns. Discussed with patient obtaining a consult to psychologist for her to discuss her current frustrations and worries and possible assist in anxiety medication. Patient verbalized agreement. Reported to [facility] the events that occurred and treatment given.

## 2014-05-16 NOTE — Progress Notes (Signed)
Subjective: Overnight, consult from pulmonary/critical care, recommended continued NPO status for possible intubation; see full note for details.   For pain and anxiety, she received morphine 2 mg x2 overnight for pain and dyspnea. She did get valium Q12 PRN twice yesterday. She reports improvement in her anxiety. States IV lorazepam was not useful previously.  She has been afebrile, with normal heart rate. Respiratory rate improved overnight. Blood pressures stable. She reports her breathing is subjectively improved. Continues to be fatigued this AM, but states that it improves over the course of the day. CXR this AM continues to show dense right diffuse consolidations.   Objective: Vital signs in last 24 hours: Filed Vitals:   05/15/14 2035 05/15/14 2321 05/16/14 0325 05/16/14 0334  BP:  128/76 120/52   Pulse: 86     Temp:  98 F (36.7 C) 97.6 F (36.4 C)   TempSrc:  Axillary Axillary   Resp: 18 20 15    Height:      Weight:      SpO2: 95% 96% 96% 95%   Weight change:   Intake/Output Summary (Last 24 hours) at 05/16/14 0658 Last data filed at 05/16/14 0600  Gross per 24 hour  Intake    970 ml  Output   2415 ml  Net  -1445 ml   General: Lying in bed, appears acutely ill CV: Irregular rhythm, normal rate.  Pulm: Poor air movement bilaterally. Bilateral crackles L>R, bilateral wheezes.  Abdomen: Active bowel sounds, soft, non-tender to palpation.  Extremities: No lower extremity edema.   Lab Results: BMET    Component Value Date/Time   NA 139 05/16/2014 0305   K 3.8 05/16/2014 0305   CL 98 05/16/2014 0305   CO2 34* 05/16/2014 0305   GLUCOSE 98 05/16/2014 0305   BUN 7 05/16/2014 0305   CREATININE 0.53 05/16/2014 0305   CREATININE 0.91 10/12/2013 1516   CALCIUM 8.5 05/16/2014 0305   GFRNONAA >90 05/16/2014 0305   GFRNONAA 66 10/12/2013 1516   GFRAA >90 05/16/2014 0305   GFRAA 76 10/12/2013 1516   CBC    Component Value Date/Time   WBC 8.0 05/16/2014 0305   RBC  3.40* 05/16/2014 0305   HGB 10.8* 05/16/2014 0305   HCT 31.9* 05/16/2014 0305   PLT 241 05/16/2014 0305   MCV 93.8 05/16/2014 0305   MCH 31.8 05/16/2014 0305   MCHC 33.9 05/16/2014 0305   RDW 15.0 05/16/2014 0305   LYMPHSABS 1.2 05/14/2014 0300   MONOABS 0.3 05/14/2014 0300   EOSABS 0.0 05/14/2014 0300   BASOSABS 0.0 05/14/2014 0300   Micro Results: Recent Results (from the past 240 hour(s))  Culture, blood (routine x 2)     Status: None (Preliminary result)   Collection Time: 05/10/14  8:05 PM  Result Value Ref Range Status   Specimen Description BLOOD RIGHT ARM  Final   Special Requests BOTTLES DRAWN AEROBIC AND ANAEROBIC 5CC  Final   Culture   Final           BLOOD CULTURE RECEIVED NO GROWTH TO DATE CULTURE WILL BE HELD FOR 5 DAYS BEFORE ISSUING A FINAL NEGATIVE REPORT Performed at [facility]    Report Status PENDING  Incomplete  Culture, blood (routine x 2)     Status: None (Preliminary result)   Collection Time: 05/10/14  8:17 PM  Result Value Ref Range Status   Specimen Description BLOOD RIGHT HAND  Final   Special Requests BOTTLES DRAWN AEROBIC AND ANAEROBIC 5CC  Final  Culture   Final           BLOOD CULTURE RECEIVED NO GROWTH TO DATE CULTURE WILL BE HELD FOR 5 DAYS BEFORE ISSUING A FINAL NEGATIVE REPORT Performed at [facility]    Report Status PENDING  Incomplete  Urine culture     Status: None   Collection Time: 05/10/14  8:37 PM  Result Value Ref Range Status   Specimen Description URINE, RANDOM  Final   Special Requests NONE  Final   Colony Count   Final    40,000 COLONIES/ML Performed at [facility]    Culture   Final    Multiple bacterial morphotypes present, none predominant. Suggest appropriate recollection if clinically indicated. Performed at [facility]    Report Status 05/12/2014 FINAL  Final  MRSA PCR Screening     Status: None   Collection Time: 05/11/14 11:29 AM  Result Value Ref Range Status   MRSA by  PCR NEGATIVE NEGATIVE Final    Comment:        The GeneXpert MRSA Assay (FDA approved for NASAL specimens only), is one component of a comprehensive MRSA colonization surveillance program. It is not intended to diagnose MRSA infection nor to guide or monitor treatment for MRSA infections.   Culture, blood (routine x 2)     Status: None (Preliminary result)   Collection Time: 05/13/14  2:30 AM  Result Value Ref Range Status   Specimen Description BLOOD LEFT ARM  Final   Special Requests BOTTLES DRAWN AEROBIC AND ANAEROBIC 5CC EACH  Final   Culture   Final           BLOOD CULTURE RECEIVED NO GROWTH TO DATE CULTURE WILL BE HELD FOR 5 DAYS BEFORE ISSUING A FINAL NEGATIVE REPORT Performed at [facility]    Report Status PENDING  Incomplete  Culture, blood (routine x 2)     Status: None (Preliminary result)   Collection Time: 05/13/14  2:45 AM  Result Value Ref Range Status   Specimen Description BLOOD LEFT HAND  Final   Special Requests BOTTLES DRAWN AEROBIC AND ANAEROBIC 5CC EACH  Final   Culture   Final           BLOOD CULTURE RECEIVED NO GROWTH TO DATE CULTURE WILL BE HELD FOR 5 DAYS BEFORE ISSUING A FINAL NEGATIVE REPORT Performed at [facility]    Report Status PENDING  Incomplete   Studies/Results: Dg Chest Port 1 View  05/15/2014   CLINICAL DATA:  Acute respiratory failure.  EXAM: PORTABLE CHEST - 1 VIEW  COMPARISON:  May 13, 2014.  FINDINGS: Stable cardiomediastinal silhouette. No pneumothorax or significant pleural effusion is noted. Increased mixed interstitial and airspace opacities are noted throughout both lungs, particularly in the right lung. This is consistent with worsening pneumonia or possibly edema.  IMPRESSION: Increased bilateral lung opacities are noted, with right worse than left. This is concerning for worsening pneumonia or edema. Followup radiographs are recommended.   Electronically Signed   By: Marijo Conception, M.D.   On: 05/15/2014 09:05     Medications:  Scheduled Meds: . antiseptic oral rinse  7 mL Mouth Rinse BID  . azithromycin  500 mg Intravenous Q24H  . budesonide (PULMICORT) nebulizer solution  0.25 mg Nebulization BID  . busPIRone  10 mg Oral TID  . ceFEPime (MAXIPIME) IV  2 g Intravenous Q12H  . diltiazem  420 mg Oral Daily  . FLUoxetine  60 mg Oral Daily  .  guaiFENesin  600 mg Oral BID  . ipratropium  0.5 mg Nebulization Q4H  . levalbuterol  0.63 mg Nebulization Q4H  . pantoprazole  40 mg Oral Daily  . vancomycin  1,000 mg Intravenous Q12H   Continuous Infusions:  PRN Meds:.diazepam, HYDROcodone-acetaminophen, morphine injection, white petrolatum   Assessment/Plan: Kelsey Garcia is a 68 yo female with a PMH significant for gold stage III COPD, atrial fibrillation, possibleHFpEF, OCD, GERD, osteoarthritis, HTN and hyperlipidemia who is now undergoing treatment for influenza, HCAP, atrial fibrillation with RVR, along with significant anxiety and pain.   Principal Problem:   Acute on chronic respiratory failure Active Problems:   HLD (hyperlipidemia)   Atrial fibrillation with rapid ventricular response   COPD (chronic obstructive pulmonary disease)   Osteoarthritis   Anxiety   Long term current use of opiate analgesic   Chronic diastolic CHF (congestive heart failure)   HCAP (healthcare-associated pneumonia)   Influenza due to influenza A virus   Pancytopenia   Respiratory acidosis   Hemoptysis   Mediastinal and hilar lymphadenopathy on CT   Fracture of rib of left side with delayed healing  ** HCAP, influenza, possible volume overload: Febrile and tachycardic on admission with symptoms of SOB, wheezing, non-productive cough, increased oxygen requirement. There was initial concern for possible CAP with a CXR showing ill-defined supra-hilar opacity. She was found to be influenza A positive, and initial antibiotics ceftriaxone and doxycycline were stopped 4/1. With hemoptysis and new RUL consolidation seen on  CXR 4/3, suspicion for HCAP was raised and she started on vancomycin/cefepime 4/2. She began to develop worsening hypoxia and required partial NRB overnight on 4/4. S/p 80 mg IV solumedrol 4/4. S/p 5 day course of oseltamivir. CXR 4/5 raised concern of possible contribution of volume overload, with  ?improvement in CXR this AM after IV lasix. BNP is 145, arguing for more of an infectious etiology.  -- Pulmonary-critical care following; appreciate reccs -- Continue partial non-re-breather with goal sat from 88-95%.  -- CXR this AM continues to show diffuse left lung infiltrate with some improvement from 4/5; read as no interim change -- Will give 40 mg IV lasix  -- On vancomycin/cefepime (started: 4/2), azithromycin (started: 4/4) -- Blood cultures from 3/31 NGTD, blood cultures from 4/3 also NGTD, still pending -- HIV antibody negative, legionella urinary antigen negative, Strep pneumoniae urinary antigen negative -- Started IV solumedrol 40 mg TID today per pulmonary-critical care  -- Will continue duonebs and d/c pulmicort per pulmonary- critical care  ** Anemia, thrombocytopenia: OA, normal hemoglobin of 13.5, dropping to 9-10 over the course of admission in the setting of hemoptysis and bleeding from hemorrhoids.  Heparin gtt started for atrial fibrillation with RVR was discontinued on 4/2. Platelets have also down-trended. This appears to be acute during the setting of this hospitalization; differential includes blood loss due to hemoptysis or GI losses vs dilutional in the setting of IVF vs drug related with multiple new antibiotics vs 2/2 SIRS.  B12 and folate during this admission are normal.  -- Continue to monitor CBC; improvement today may reflect previous dilution vs current hemoconcentration -- Smear reviewed; flagged in system for atypical lymphocyte; in the setting of acute infection, will likely defer referral to hematopathology.   ** Atrial fibrillation with RVR: Tachycardic to  140s on admission in the setting of missing home diltiazem. S/p diltiazem drip, now on home po dose. Rate improved now. TSH mildly low at 0.293. T4 WNL at 1.04.  -- Monitor on telemetry -- Off  heparin drip due to declining hemoglobin -- Home diltiazem (tiazac 24 hr capsule) 420 mg daily -- Hemoglobin A1c 5.7, sightly elevated with no history of diabetes, will likely need follow- up as an outpatient  ** COPD: GOLD stage III.  -- D/c Symbicort as above -- Will make DuoNebs Q6 hours as above -- Antiseptic oral rinse with steroid inhalers  ** HFpEF: Unclear history of CHF.  -- Continue Aspirin 81 mg -- Lasix as above  ** Hypertension: Appears well controlled.  -- Continue home dilltiazem  ** Hyperlipidemia: Lipid panel 10/12/2013 notable for total cholesterol 194, HDL 52, LDL 127, triglycerides 76. She is not on any statin therapy. -- Defer starting statin therapy as outpatient  ** Osteoarthritis: Complex pain management with previously being discharged from our clinic for becoming verbally abusive when discussing pain medications.  -- Will d/c morphine now that taking po meds -- Will give home norco -- Provide her with resources to establish PCP care elsewhere  ** OCD: She follows with a therapist at [facility]. Currently stated mood of panic could certainly be contributed to by solumedrol; however, value of steroids in treating her respiratory status likely outweighs side effects.  -- Will CTM need for increased anxiolytics with steroids; will need to weigh with possible worsened hypotension -- Continue home  buspar, prozac -- Continue valium 2Q 12 PRN  **FEN/GI: -- Diet: Heart diet -- Hypokalemic on admission, 3.8 today  ** PPx:  -- DVT: off heparin as above, SCDs -- Holding home pantoprazole with NPO status  ** Dispo:  -- Full code -- Will need resources to establish PCP prior to discharge -- Will need cardiology follow-up prior to discharge   This is a Careers information officer  Note.  The care of the patient was discussed with Dr. Gordy Levan and the assessment and plan formulated with their assistance.  Please see their attached note for official documentation of the daily encounter.   LOS: 5 days   Kelsey Garcia, Med Student 05/16/2014, 6:58 AM  [phone]

## 2014-05-16 NOTE — Progress Notes (Addendum)
ANTIBIOTIC CONSULT NOTE - FOLLOW UP  Pharmacy Consult for Vancomycin and Cefepime Indication: pneumonia  Allergies  Allergen Reactions  . Ace Inhibitors Cough  . Diphenhydramine Hcl Other (See Comments)    "feels like my skin is crawling, and legs twitches"  . Erythromycin Diarrhea  . Nsaids Nausea And Vomiting  . Nyquil [Pseudoeph-Doxylamine-Dm-Apap] Itching  . Tramadol Hcl Other (See Comments)    stomach pain, hallucinations  . Flexeril [Cyclobenzaprine] Anxiety  . Pseudoephedrine Palpitations    Patient Measurements: Height: 5\' 5"  (165.1 cm) Weight: 194 lb 11.2 oz (88.315 kg) IBW/kg (Calculated) : 57 Adjusted Body Weight: n/a   Labs:  Recent Labs  05/14/14 0300 05/15/14 0344 05/16/14 0305  WBC 3.4* 2.7* 8.0  HGB 9.9* 10.8* 10.8*  PLT 142* 125* 241  CREATININE 0.60 0.49* 0.53   Estimated Creatinine Clearance: 74.9 mL/min (by C-G formula based on Cr of 0.53). No results for input(s): VANCOTROUGH, VANCOPEAK, VANCORANDOM, GENTTROUGH, GENTPEAK, GENTRANDOM, TOBRATROUGH, TOBRAPEAK, TOBRARND, AMIKACINPEAK, AMIKACINTROU, AMIKACIN in the last 72 hours.    Assessment: 69 yo female on vancomycin and cefepime per pharmacy dosing, and azithromycin (per MD). Influenza A +. Suspected PNA.  Afebrile today.  WBC WNL, but increased from yesterday 2.7 -> 8.    3/31 Blood cx > ngtd 4/3 Blood Cx > ngtd 3/31 Urine cx > 40k of Multiple bacteria  4/1 Tamiflu >> 5 days 4/3 Cefepime > 4/3 Vancomycin > 4/4 Azithromycin > 4/1 Oseltamivir >4/6  Goal of Therapy:  Vancomycin trough level 15-20 mcg/ml  Plan:  1. Continue cefepime 2g q 12 hrs. 2. Check vancomycin trough level before next dose this afternoon. 3. F/u LOT or narrow antibiotics soon?  Uvaldo Rising, BCPS  Clinical Pharmacist Pager [phone]  05/16/2014 11:12 AM     ADDENDUM Vancomycin trough drawn this evening is therapeutic at 16.19mcg/mL.  No other changed in clinical status. Renal function  stable.  Plan: -Continue vancomycin at same dose of 1g IV q12h.  Jerrick Farve D. Jaizon Deroos, PharmD, BCPS Clinical Pharmacist Pager: [phone] 05/16/2014 4:47 PM

## 2014-05-17 ENCOUNTER — Inpatient Hospital Stay (HOSPITAL_COMMUNITY): Payer: Medicare Other

## 2014-05-17 ENCOUNTER — Ambulatory Visit: Payer: Medicare Other | Admitting: Physical Therapy

## 2014-05-17 LAB — CBC
HEMATOCRIT: 36.2 % (ref 36.0–46.0)
Hemoglobin: 11.6 g/dL — ABNORMAL LOW (ref 12.0–15.0)
MCH: 31.2 pg (ref 26.0–34.0)
MCHC: 32 g/dL (ref 30.0–36.0)
MCV: 97.3 fL (ref 78.0–100.0)
PLATELETS: 165 10*3/uL (ref 150–400)
RBC: 3.72 MIL/uL — AB (ref 3.87–5.11)
RDW: 14.7 % (ref 11.5–15.5)
WBC: 5.1 10*3/uL (ref 4.0–10.5)

## 2014-05-17 LAB — POCT I-STAT 3, ART BLOOD GAS (G3+)
ACID-BASE EXCESS: 18 mmol/L — AB (ref 0.0–2.0)
BICARBONATE: 44.7 meq/L — AB (ref 20.0–24.0)
O2 Saturation: 100 %
PO2 ART: 213 mmHg — AB (ref 80.0–100.0)
TCO2: 46 mmol/L (ref 0–100)
pCO2 arterial: 60.8 mmHg (ref 35.0–45.0)
pH, Arterial: 7.474 — ABNORMAL HIGH (ref 7.350–7.450)

## 2014-05-17 LAB — CULTURE, BLOOD (ROUTINE X 2)
Culture: NO GROWTH
Culture: NO GROWTH

## 2014-05-17 LAB — GLUCOSE, CAPILLARY
GLUCOSE-CAPILLARY: 135 mg/dL — AB (ref 70–99)
Glucose-Capillary: 170 mg/dL — ABNORMAL HIGH (ref 70–99)

## 2014-05-17 LAB — BASIC METABOLIC PANEL
ANION GAP: 9 (ref 5–15)
BUN: 7 mg/dL (ref 6–23)
CHLORIDE: 96 mmol/L (ref 96–112)
CO2: 36 mmol/L — AB (ref 19–32)
Calcium: 8.7 mg/dL (ref 8.4–10.5)
Creatinine, Ser: 0.51 mg/dL (ref 0.50–1.10)
GFR calc Af Amer: >90 mL/min (ref >90)
GFR calc non Af Amer: >90 mL/min (ref >90)
Glucose, Bld: 125 mg/dL — ABNORMAL HIGH (ref 70–99)
POTASSIUM: 3.4 mmol/L — AB (ref 3.5–5.1)
SODIUM: 141 mmol/L (ref 135–145)

## 2014-05-17 MED ORDER — ALPRAZOLAM 0.25 MG PO TABS
0.2500 mg | ORAL_TABLET | Freq: Three times a day (TID) | ORAL | Status: DC | PRN
  Administered 2014-05-17 – 2014-05-21 (×9): 0.25 mg via ORAL
  Filled 2014-05-17 (×9): qty 1

## 2014-05-17 MED ORDER — PREDNISONE 50 MG PO TABS
60.0000 mg | ORAL_TABLET | Freq: Every day | ORAL | Status: DC
  Filled 2014-05-17 (×2): qty 1

## 2014-05-17 MED ORDER — POTASSIUM CHLORIDE CRYS ER 20 MEQ PO TBCR
40.0000 meq | EXTENDED_RELEASE_TABLET | Freq: Every day | ORAL | Status: DC
  Administered 2014-05-17: 40 meq via ORAL
  Filled 2014-05-17: qty 2

## 2014-05-17 MED ORDER — POTASSIUM CHLORIDE CRYS ER 20 MEQ PO TBCR
40.0000 meq | EXTENDED_RELEASE_TABLET | Freq: Two times a day (BID) | ORAL | Status: DC
  Administered 2014-05-18 – 2014-05-22 (×9): 40 meq via ORAL
  Filled 2014-05-17 (×11): qty 2

## 2014-05-17 NOTE — Progress Notes (Signed)
Patient seen and examined. Case d/w residents in detail on morning rounds. I agree with findings and plan as outlined in Dr. Ferd Glassing note.  Patient feels better today with improved O2 sats on NRB.  Will complete 7 day course of abx. C/w duonebs. Will d/c solumedrol and start prednisone 60 mg. Will also d/c IV lasix and continue with home dose of lasix.   Afib now rate controlled. C/w diltiazem. No anticoagulation at this time given hemoptysis and patient does not want long term a/c

## 2014-05-17 NOTE — Progress Notes (Signed)
        Subjective:   Pt looks better today and breathing better.  She is satting 100% on NRB.  VS stable.     Objective:   Filed Vitals:   05/17/14 0747  BP: 145/94  Pulse:   Temp: 96.7 F (35.9 C)  Resp: 18   Physical Exam: Constitutional: Vital signs reviewed. Patient is sitting up in bed in NAD.    HEENT: [place]/AT; EOMI, conjunctivae normal, no scleral icterus  Cardiovascular: irregularly irregular rhythm, no MRG Pulmonary/Chest: Decreased breath sounds with diffuse crackles and minimal wheezing.   Abdominal: Soft. +BS, NT/ND Neurological: A&O x3, CN II-XII grossly intact; non-focal exam Extremities: 2+DP b/l, no b/l pitting edema Skin: Warm, dry. Multiple excoriations.    Assessment/Plan:   Principal Problem:  Influenza due to influenza A virus Active Problems:  HLD (hyperlipidemia)  Atrial fibrillation with rapid ventricular response  COPD (chronic obstructive pulmonary disease)  Osteoarthritis  Long term current use of opiate analgesic   Acute on chronic hypercarbic respiratory failure- Likely d/t AECOPD and viral PNA vs. bacterial.  She is satting 100% on NRB.  CXR this AM showed persistent b/l infiltrates, prominent in R lung.  Cannot exclude small effusion.  Persistent cardiomegaly without venous congestion.  Good UOP.   -cont vanc/cefepime (day 5) azithromycin (day 4)-->will complete 7 day course  -cont duonebs, Id/c solumedrol -cont home dose of lasix -prednisone 60mg  starting tomorrow  -keep sats 88-95%  -try to wean off NRB and back on [place]   Influenza A- Pt influenza A positive.  Completed 5 day course of tamiflu.   -cont symptomatic tx  Hemoptysis- Improved.  Hgb stable. -cont monitoring H/H  COPD- -continue current meds -plans per above   Afib with RVR- HR controlled.  Likely exacerbated by influenza, ?PNA, albuterol, and continued lasix.   Refuses anticoagulation.   -cont cardizem -monitor on telemetry  Right shoulder  pain- -cont vicodin  -kpad  Disposition Disposition is deferred, awaiting improvement of current medical problems. Anticipated discharge in approximately 1-2 day(s).   Jones Bales, MD PGY-2, Internal Medicine Teaching Service 10:17 AM

## 2014-05-17 NOTE — Progress Notes (Signed)
Subjective: States her breathing is "good." Denies any change in perceived SOB between partial NRB and venti mask. States her anxiety was bad last night.   She says she has been coughing up some sputum that looks like it might have blood in it, but says this is improved in amount from before. She is unsure if she ever had frank blood in her sputum.   Objective: Vital signs in last 24 hours: Filed Vitals:   05/17/14 0200 05/17/14 0300 05/17/14 0747 05/17/14 1019  BP: 104/43 124/73 145/94   Pulse:      Temp:  97.3 F (36.3 C) 96.7 F (35.9 C)   TempSrc:  Oral Axillary   Resp: 33 19 18   Height:      Weight:      SpO2: 99% 99% 100% 92%   Weight change:   Intake/Output Summary (Last 24 hours) at 05/17/14 1022 Last data filed at 05/17/14 0600  Gross per 24 hour  Intake    610 ml  Output   2200 ml  Net  -1590 ml   General: Sitting upright in bed in no acute distress.  CV: Irregular rhythm, normal rate Pulm: Improved work of breathing. Continued crackles and wheezes diffusely bilaterally. Continued shallow breathing.  Extremities: Trace pitting edema bilaterally   Lab Results: Basic Metabolic Panel:  Recent Labs  05/16/14 0305 05/17/14 0354  NA 139 141  K 3.8 3.4*  CL 98 96  CO2 34* 36*  GLUCOSE 98 125*  BUN 7 7  CREATININE 0.53 0.51  CALCIUM 8.5 8.7  MG 2.1  --    Liver Function Tests:  Recent Labs  05/15/14 0344  AST 29  ALT 14  ALKPHOS 62  BILITOT 0.8  PROT 5.5*  ALBUMIN 2.4*   CBC:  Recent Labs  05/16/14 0305 05/17/14 0354  WBC 8.0 5.1  HGB 10.8* 11.6*  HCT 31.9* 36.2  MCV 93.8 97.3  PLT 241 165   CBG:  Recent Labs  05/14/14 2335 05/15/14 0553 05/15/14 1143 05/15/14 1827 05/16/14 0803 05/16/14 1748  GLUCAP 120* 138* 128* 182* 124* 186*   Drugs of Abuse     Component Value Date/Time   LABOPIA PPS 10/30/2011 1115   LABOPIA POSITIVE* 09/25/2010 0149   COCAINSCRNUR NEG 10/30/2011 1115   COCAINSCRNUR NONE DETECTED 09/25/2010 0149     LABBENZ PPS 10/30/2011 1115   LABBENZ NEG 10/03/2010 1153   LABBENZ NONE DETECTED 09/25/2010 0149   AMPHETMU NEG 10/30/2011 1115   AMPHETMU NEG 10/03/2010 1153   AMPHETMU NONE DETECTED 09/25/2010 0149   THCU NEG 10/30/2011 1115   THCU NONE DETECTED 09/25/2010 0149   LABBARB NEG 10/30/2011 1115   LABBARB NONE DETECTED 09/25/2010 0149    Micro Results: Recent Results (from the past 240 hour(s))  Culture, blood (routine x 2)     Status: None   Collection Time: 05/10/14  8:05 PM  Result Value Ref Range Status   Specimen Description BLOOD RIGHT ARM  Final   Special Requests BOTTLES DRAWN AEROBIC AND ANAEROBIC 5CC  Final   Culture   Final    NO GROWTH 5 DAYS Performed at [facility]    Report Status 05/17/2014 FINAL  Final  Culture, blood (routine x 2)     Status: None   Collection Time: 05/10/14  8:17 PM  Result Value Ref Range Status   Specimen Description BLOOD RIGHT HAND  Final   Special Requests BOTTLES DRAWN AEROBIC AND ANAEROBIC 5CC  Final   Culture  Final    NO GROWTH 5 DAYS Performed at [facility]    Report Status 05/17/2014 FINAL  Final  Urine culture     Status: None   Collection Time: 05/10/14  8:37 PM  Result Value Ref Range Status   Specimen Description URINE, RANDOM  Final   Special Requests NONE  Final   Colony Count   Final    40,000 COLONIES/ML Performed at [facility]    Culture   Final    Multiple bacterial morphotypes present, none predominant. Suggest appropriate recollection if clinically indicated. Performed at [facility]    Report Status 05/12/2014 FINAL  Final  MRSA PCR Screening     Status: None   Collection Time: 05/11/14 11:29 AM  Result Value Ref Range Status   MRSA by PCR NEGATIVE NEGATIVE Final    Comment:        The GeneXpert MRSA Assay (FDA approved for NASAL specimens only), is one component of a comprehensive MRSA colonization surveillance program. It is not intended to diagnose  MRSA infection nor to guide or monitor treatment for MRSA infections.   Culture, blood (routine x 2)     Status: None (Preliminary result)   Collection Time: 05/13/14  2:30 AM  Result Value Ref Range Status   Specimen Description BLOOD LEFT ARM  Final   Special Requests BOTTLES DRAWN AEROBIC AND ANAEROBIC 5CC EACH  Final   Culture   Final           BLOOD CULTURE RECEIVED NO GROWTH TO DATE CULTURE WILL BE HELD FOR 5 DAYS BEFORE ISSUING A FINAL NEGATIVE REPORT Performed at [facility]    Report Status PENDING  Incomplete  Culture, blood (routine x 2)     Status: None (Preliminary result)   Collection Time: 05/13/14  2:45 AM  Result Value Ref Range Status   Specimen Description BLOOD LEFT HAND  Final   Special Requests BOTTLES DRAWN AEROBIC AND ANAEROBIC 5CC EACH  Final   Culture   Final           BLOOD CULTURE RECEIVED NO GROWTH TO DATE CULTURE WILL BE HELD FOR 5 DAYS BEFORE ISSUING A FINAL NEGATIVE REPORT Performed at [facility]    Report Status PENDING  Incomplete   Studies/Results: Dg Chest Port 1 View  05/17/2014   CLINICAL DATA:  Community-acquired pneumonia.  EXAM: PORTABLE CHEST - 1 VIEW  COMPARISON:  05/16/2014.  FINDINGS: Mediastinal structures are normal. Stable cardiomegaly. Persistent diffuse bilateral pulmonary infiltrates are again noted. Particularly prominent throughout the right lung. Small left pleural effusion cannot be excluded. No pneumothorax.  IMPRESSION: 1. Persistent diffuse bilateral pulmonary infiltrates, particularly prominent in the right lung. Small left effusion cannot be excluded.  2. Persistent cardiomegaly.  No pulmonary venous congestion.   Electronically Signed   By: Marcello Moores  Register   On: 05/17/2014 07:31   Dg Chest Port 1 View  05/16/2014   CLINICAL DATA:  Pneumonia.  Shortness of breath.  EXAM: PORTABLE CHEST - 1 VIEW  COMPARISON:  05/15/2014 .  FINDINGS: Mediastinum hilar structures are stable. Persistent severe bilateral pulmonary  airspace disease noted. Changes are most prominent throughout the right lung. Persistent cardiomegaly. Small left pleural effusion cannot be excluded. No pneumothorax. No acute osseus abnormality .  IMPRESSION: 1. Persistent severe bilateral pulmonary infiltrates, particular prominent throughout the right lung. Findings consistent with bilateral pneumonia and/or pulmonary edema. No interim change. Small left pleural effusion cannot be excluded. 2. Persistent cardiomegaly.  Electronically Signed   By: Marcello Moores  Register   On: 05/16/2014 07:20   Medications:  Scheduled Meds: . antiseptic oral rinse  7 mL Mouth Rinse BID  . azithromycin  500 mg Intravenous Q24H  . busPIRone  10 mg Oral TID  . ceFEPime (MAXIPIME) IV  2 g Intravenous Q12H  . diltiazem  420 mg Oral Daily  . FLUoxetine  60 mg Oral Daily  . furosemide  80 mg Oral BID  . guaiFENesin  600 mg Oral BID  . ipratropium-albuterol  3 mL Nebulization Q6H WA  . pantoprazole  40 mg Oral Daily  . [START ON 05/18/2014] predniSONE  60 mg Oral Q breakfast  . vancomycin  1,000 mg Intravenous Q12H   Continuous Infusions:  PRN Meds:.albuterol, diazepam, HYDROcodone-acetaminophen, white petrolatum   Assessment/Plan: Mrs Feild is a 69 yo female with a PMH significant for gold stage III COPD, atrial fibrillation, possible HFpEF, OCD, GERD, osteoarthritis, HTN and hyperlipidemia who is now undergoing treatment for influenza, possible HCAP, atrial fibrillation with RVR, along with significant anxiety and pain.   Principal Problem:   Acute on chronic respiratory failure Active Problems:   HLD (hyperlipidemia)   Atrial fibrillation with rapid ventricular response   COPD (chronic obstructive pulmonary disease)   Osteoarthritis   Anxiety   Long term current use of opiate analgesic   Chronic diastolic CHF (congestive heart failure)   HCAP (healthcare-associated pneumonia)   Influenza due to influenza A virus   Pancytopenia   Hemoptysis   Mediastinal  and hilar lymphadenopathy on CT  ** HCAP, influenza, possible volume overload: Febrile and tachycardic on admission with symptoms of SOB, wheezing, non-productive cough, increased oxygen requirement. There was initial concern for possible CAP with a CXR showing ill-defined supra-hilar opacity. She was found to be influenza A positive, and initial antibiotics ceftriaxone and doxycycline were stopped 4/1. With hemoptysis and new RUL consolidation seen on CXR 4/3, suspicion for HCAP was raised and she started on vancomycin/cefepime 4/2. She began to develop worsening hypoxia and required partial NRB overnight on 4/4. S/p 5 day course of oseltamivir. BNP is 145, arguing for infection more than volume overload.  -- Pulmonary-critical care c/s; appreciate reccs -- Will try to wean from partial non-breather with goal sat from 88-95%.  -- CXR this AM continues to show diffuse infiltrate with some improvement from 4/6 -- Will transition from IV lasix to home oral lasix 80 mg BID -- On vancomycin/cefepime (day 5/7), azithromycin (day 4/7) -- Blood cultures from 3/31 with no growth for 5 days, blood cultures from 4/3 also NGTD, still pending -- HIV antibody negative, legionella urinary antigen negative, Strep pneumoniae urinary antigen negative -- Will wean from IV solumedrol 40 mg TID to po prednisone 60 mg daily tomorrow AM -- Will continue duonebs  ** Anemia, thrombocytopenia: Hemoglobin down-trended over the course of admission in the setting of hemoptysis and bleeding from hemorrhoids.Heparin gtt started for atrial fibrillation with RVR was discontinued on 4/2. Platelets also down-trended. Both anemia and thrombocytopenia have improved now. -- Continue to monitor CBC -- Smear pending review by hematopathology  ** Atrial fibrillation with RVR: Tachycardic to 140s on admission in the setting of missing home diltiazem. S/p diltiazem drip, now on home po dose. Rate improved now. TSH mildly low at 0.293.  T4 WNL at 1.04.  -- Monitor on telemetry -- Off heparin drip due to declining hemoglobin -- Home diltiazem (tiazac 24 hr capsule) 420 mg daily -- Hemoglobin A1c 5.7, sightly elevated with no  history of diabetes, will likely need follow- up as an outpatient  ** COPD: GOLD stage III.  -- Continue DuoNebs Q6 hours -- Antiseptic oral rinse with steroid inhalers  ** HFpEF: Unclear history of CHF.  -- Continue Aspirin 81 mg -- Lasix as above  ** Hypertension: Appears well controlled.  -- Continue home dilltiazem  ** Hyperlipidemia: Lipid panel 10/12/2013 notable for total cholesterol 194, HDL 52, LDL 127, triglycerides 76. She is not on any statin therapy. -- Defer starting statin therapy as outpatient  ** Osteoarthritis: Complex pain management with previously being discharged from our clinic for becoming verbally abusive when discussing pain medications.  -- On norco, slightly lower dose than home dose -- Provide her with resources to establish PCP care elsewhere  ** OCD: She follows with a therapist at [facility]. Currently stated mood of panic could certainly be contributed to by solumedrol; however, value of steroids in treating her respiratory status likely outweighs side effects.  -- Continue home buspar, prozac -- Continue valium 2Q 12 PRN -- Will try to contact outpatient therapist per patient request -- Will defer any changes to outpatient psychiatric follow-up   **FEN/GI: -- Diet: Heart diet -- On potassium as an outpatient; 3.4 today; will replete with 40 mEq today  ** PPx:  -- DVT: SCDs -- Home pantoprazole  ** Dispo:  -- Full code -- Will need resources to establish PCP prior to discharge -- Will need cardiology follow-up scheduled  prior to discharge  -- Will need pulmonology follow-up scheduled with [name] with pulmonary prior to discharge  This is a Medical Student Note.  The care of the patient was discussed with Dr. Gordy Levan and the assessment and plan  formulated with their assistance.  Please see their attached note for official documentation of the daily encounter.   LOS: 6 days   Dierdre Forth, Med Student 05/17/2014, 10:22 AM  [phone]

## 2014-05-17 NOTE — Progress Notes (Signed)
PULMONARY / CRITICAL CARE MEDICINE   Name: Kelsey Garcia MRN: [id number] DOB: 1945-10-17    ADMISSION DATE:  05/10/2014 CONSULTATION DATE: 05/15/2014  REFERRING MD :    CHIEF COMPLAINT:  Increasing oxygen requirements  INITIAL PRESENTATION:  69 yo female admitted with dyspnea, wheeze from Influenza A PNA and a fib with RVR.  She has hx of COPD, A fib, chronic diastolic CHF, HTN, and anxiety.  STUDIES:  Chest CT 4/3 - bilateral multilobar airspace disease with greatest involvement of right upper lobe suspicious for pneumonia   SUBJECTIVE:   Breathing better.  Still feels weak.  VITAL SIGNS: Temp:  [96.7 F (35.9 C)-98.1 F (36.7 C)] 96.7 F (35.9 C) (04/07 0747) Pulse Rate:  [79-80] 79 (04/06 1900) Resp:  [18-33] 18 (04/07 0747) BP: (104-145)/(43-94) 145/94 mmHg (04/07 0747) SpO2:  [88 %-100 %] 100 % (04/07 0747) FiO2 (%):  [55 %] 55 % (04/06 1900) INTAKE / OUTPUT:  Intake/Output Summary (Last 24 hours) at 05/17/14 0816 Last data filed at 05/17/14 0600  Gross per 24 hour  Intake    670 ml  Output   2600 ml  Net  -1930 ml    PHYSICAL EXAMINATION: General: no distress Neuro: normal strength HEENT: no sinus tenderness Cardiovascular: irregular Lungs: b/l crackles, no wheeze Abdomen: soft, nontender Musculoskeletal: 1+ edema Skin: multiple areas of ecchymosis  LABS:  CBC  Recent Labs Lab 05/15/14 0344 05/16/14 0305 05/17/14 0354  WBC 2.7* 8.0 5.1  HGB 10.8* 10.8* 11.6*  HCT 34.1* 31.9* 36.2  PLT 125* 241 165   BMET  Recent Labs Lab 05/15/14 0344 05/16/14 0305 05/17/14 0354  NA 139 139 141  K 3.9 3.8 3.4*  CL 103 98 96  CO2 30 34* 36*  BUN 6 7 7   CREATININE 0.49* 0.53 0.51  GLUCOSE 136* 98 125*   Electrolytes  Recent Labs Lab 05/13/14 0111  05/15/14 0344 05/16/14 0305 05/17/14 0354  CALCIUM  --   < > 8.5 8.5 8.7  MG 2.0  --   --  2.1  --   < > = values in this interval not displayed.   Sepsis Markers  Recent Labs Lab 05/10/14 2028  05/14/14 0300  LATICACIDVEN 1.27  --   PROCALCITON  --  <0.10   ABG  Recent Labs Lab 05/13/14 1633 05/13/14 2030 05/15/14 0840  PHART 7.415 7.349* 7.350  PCO2ART 46.8* 56.4* 56.1*  PO2ART 135.0* 96.4 70.2*   Liver Enzymes  Recent Labs Lab 05/10/14 2005 05/15/14 0344  AST 18 29  ALT 15 14  ALKPHOS 101 62  BILITOT 0.6 0.8  ALBUMIN 3.0* 2.4*   Glucose  Recent Labs Lab 05/14/14 2335 05/15/14 0553 05/15/14 1143 05/15/14 1827 05/16/14 0803 05/16/14 1748  GLUCAP 120* 138* 128* 182* 124* 186*    Imaging Dg Chest Port 1 View  05/16/2014   CLINICAL DATA:  Pneumonia.  Shortness of breath.  EXAM: PORTABLE CHEST - 1 VIEW  COMPARISON:  05/15/2014 .  FINDINGS: Mediastinum hilar structures are stable. Persistent severe bilateral pulmonary airspace disease noted. Changes are most prominent throughout the right lung. Persistent cardiomegaly. Small left pleural effusion cannot be excluded. No pneumothorax. No acute osseus abnormality .  IMPRESSION: 1. Persistent severe bilateral pulmonary infiltrates, particular prominent throughout the right lung. Findings consistent with bilateral pneumonia and/or pulmonary edema. No interim change. Small left pleural effusion cannot be excluded. 2. Persistent cardiomegaly.   Electronically Signed   By: Marcello Moores  Register   On: 05/16/2014 07:20  ASSESSMENT / PLAN: Blood 3/31 >> Influenza 4/01 >> Positive for Influenza A Blood 4/03 >>  Acute on chronic respiratory failure from influenza PNA and sepsis >> completed tamiflu 5/05. Hx of COPD. P:   D/c'ed pulmicort 4/06 Added solumedrol 4/06 >> wean off as tolerated Continue duoneb >> changed to q6h F/u CXR intermittently Bronchial hygiene Oxygen to keep SpO2 88 to 95% Continue diuresis as tolerated Day 8 of Abx, currently on vancomycin, cefepime, zithromax >> likely can d/c abx soon >> defer to primary team  Afib with RVR. Chronic diastolic heart failure. P:  Continue  cardizem Anticoagulation per primary team  Hx of Anxiety, and chronic pain  P:   Continue diazepam, buspar, and vicodin  PRN morphine  SUMMARY: Respiratory status and CXR stable to slightly improved.  No indication for intubation at this time.  PCCM will sign off.  Please call if additional help needed.  Please ensure she has pulmonary office follow up visit with Dr. Kara Mead within 2 weeks of hospital discharge.  Chesley Mires, MD [facility] Pulmonary/Critical Care 05/17/2014, 8:16 AM Pager:  [phone] After 3pm call: [phone]

## 2014-05-17 NOTE — Progress Notes (Signed)
Requested for pt to allow RN to give bath several times throughout night. Pt refused each time and stated she "does not care if she stinks." Will pass along to day RN.

## 2014-05-17 NOTE — Progress Notes (Signed)
Physical Therapy Treatment Patient Details Name: DARITZA BREES MRN: [id number] DOB: 09-03-1945 Today's Date: 05/17/2014    History of Present Illness 69 y.o. female, influenza A positive and Afib with RVR.    PT Comments    Pt with flat affect, decreased cognition, awareness and problem solving. Pt sitting at foot of bed with legs crossed under her. Pt unaware of catheter wrapped around her leg, situation, or time. Pt with random statements throughout session that were tangential and pt unable to state thoughts. Pt remains anxious and stating she is dying with return to bed although sats 98% on 6L via [place]. Pt reassured, encouraged and calmed throughout. Pt without significant progress with mobility this way and need to consider ST-SNF if pt unable to mobilize.  Follow Up Recommendations  SNF;Supervision for mobility/OOB (if pt unable to make significant progress toward mobility)     Equipment Recommendations  Rolling walker with 5" wheels    Recommendations for Other Services       Precautions / Restrictions Precautions Precautions: Fall Precaution Comments: watch sats    Mobility  Bed Mobility Overal bed mobility: Needs Assistance Bed Mobility: Sit to Supine       Sit to supine: Supervision   General bed mobility comments: cues for sequence   Transfers Overall transfer level: Needs assistance   Transfers: Sit to/from Stand Sit to Stand: Min assist         General transfer comment: cues for hand placement and safety with sit to stand x 5 with stepping toward HOB x 4 with max cues, RW and assist to manipulate RW toward [facility], unable to step away from bed as pt with spontaneously sit with each trial without warning  Ambulation/Gait Ambulation/Gait assistance:  (unable)               Stairs            Wheelchair Mobility    Modified Rankin (Stroke Patients Only)       Balance Overall balance assessment: Needs assistance   Sitting balance-Leahy  Scale: Good       Standing balance-Leahy Scale: Fair                      Cognition Arousal/Alertness: Awake/alert Behavior During Therapy: Flat affect Overall Cognitive Status: Impaired/Different from baseline Area of Impairment: Orientation;Attention;Safety/judgement Orientation Level: Disoriented to;Time;Situation;Place Current Attention Level: Sustained     Safety/Judgement: Decreased awareness of safety     General Comments: Pt with decreased cognition today. Stating "I don't think so" but when asked what she is referring to unable to state    Exercises      General Comments        Pertinent Vitals/Pain Pain Assessment: No/denies pain  HR 82 sats 90-96% on 6L    Home Living                      Prior Function            PT Goals (current goals can now be found in the care plan section) Progress towards PT goals: Not progressing toward goals - comment;Goals downgraded-see care plan    Frequency       PT Plan Discharge plan needs to be updated    Co-evaluation             End of Session Equipment Utilized During Treatment: Oxygen;Gait belt Activity Tolerance: Patient limited by fatigue;Other (comment) Patient left: in bed;with call bell/phone  within reach;with bed alarm set     Time: 9798-9211 PT Time Calculation (min) (ACUTE ONLY): 25 min  Charges:  $Therapeutic Activity: 23-37 mins                    G Codes:      Melford Aase 05/25/14, 2:31 PM Elwyn Reach, [place]

## 2014-05-17 NOTE — Progress Notes (Signed)
Entered pts room to hear patient repeatedly saying "help me, help me."  Pt had removed venturi mask and was standing at end of bed.  Pt placed back on venti mask and situated in bed.  Sats in the mid 80's on 55% venti mask.  Pt repeated pulling mask off and stating "I cant breathe."  Pt placed back on NRB and given PRN valium.  Will cont to closely monitor.  Vernell Back, [facility]

## 2014-05-18 ENCOUNTER — Other Ambulatory Visit: Payer: Medicare Other

## 2014-05-18 ENCOUNTER — Ambulatory Visit: Payer: Medicare Other | Admitting: Cardiovascular Disease

## 2014-05-18 DIAGNOSIS — J9622 Acute and chronic respiratory failure with hypercapnia: Secondary | ICD-10-CM

## 2014-05-18 LAB — BASIC METABOLIC PANEL
ANION GAP: 6 (ref 5–15)
Anion gap: 7 (ref 5–15)
BUN: 12 mg/dL (ref 6–23)
BUN: 11 mg/dL (ref 6–23)
CALCIUM: 8.8 mg/dL (ref 8.4–10.5)
CO2: 46 mmol/L (ref 19–32)
CO2: 41 mmol/L — AB (ref 19–32)
Calcium: 9 mg/dL (ref 8.4–10.5)
Chloride: 91 mmol/L — ABNORMAL LOW (ref 96–112)
Chloride: 92 mmol/L — ABNORMAL LOW (ref 96–112)
Creatinine, Ser: 0.63 mg/dL (ref 0.50–1.10)
Creatinine, Ser: 0.61 mg/dL (ref 0.50–1.10)
GFR calc Af Amer: >90 mL/min (ref >90)
GFR calc non Af Amer: >90 mL/min (ref >90)
GFR calc non Af Amer: >90 mL/min (ref >90)
GLUCOSE: 142 mg/dL — AB (ref 70–99)
Glucose, Bld: 140 mg/dL — ABNORMAL HIGH (ref 70–99)
Potassium: 3 mmol/L — ABNORMAL LOW (ref 3.5–5.1)
Potassium: 3.6 mmol/L (ref 3.5–5.1)
SODIUM: 140 mmol/L (ref 135–145)
Sodium: 143 mmol/L (ref 135–145)

## 2014-05-18 LAB — GLUCOSE, CAPILLARY
GLUCOSE-CAPILLARY: 149 mg/dL — AB (ref 70–99)
GLUCOSE-CAPILLARY: 236 mg/dL — AB (ref 70–99)
Glucose-Capillary: 135 mg/dL — ABNORMAL HIGH (ref 70–99)

## 2014-05-18 LAB — CBC
HEMATOCRIT: 37.8 % (ref 36.0–46.0)
Hemoglobin: 12.2 g/dL (ref 12.0–15.0)
MCH: 31 pg (ref 26.0–34.0)
MCHC: 32.3 g/dL (ref 30.0–36.0)
MCV: 95.9 fL (ref 78.0–100.0)
Platelets: 238 10*3/uL (ref 150–400)
RBC: 3.94 MIL/uL (ref 3.87–5.11)
RDW: 14.5 % (ref 11.5–15.5)
WBC: 9.3 10*3/uL (ref 4.0–10.5)

## 2014-05-18 LAB — MAGNESIUM: MAGNESIUM: 2.1 mg/dL (ref 1.5–2.5)

## 2014-05-18 MED ORDER — BENZONATATE 100 MG PO CAPS
100.0000 mg | ORAL_CAPSULE | Freq: Two times a day (BID) | ORAL | Status: DC | PRN
  Administered 2014-05-18 – 2014-05-19 (×2): 100 mg via ORAL
  Filled 2014-05-18 (×4): qty 1

## 2014-05-18 MED ORDER — PREDNISONE 10 MG PO TABS
60.0000 mg | ORAL_TABLET | Freq: Every day | ORAL | Status: AC
  Administered 2014-05-18: 60 mg via ORAL
  Filled 2014-05-18: qty 1

## 2014-05-18 MED ORDER — PREDNISONE 20 MG PO TABS
40.0000 mg | ORAL_TABLET | Freq: Every day | ORAL | Status: AC
  Administered 2014-05-19 – 2014-05-22 (×4): 40 mg via ORAL
  Filled 2014-05-18 (×5): qty 2

## 2014-05-18 MED ORDER — POTASSIUM CHLORIDE 10 MEQ/100ML IV SOLN: 10.0000 meq | INTRAVENOUS | Status: DC

## 2014-05-18 MED ORDER — POTASSIUM CHLORIDE 10 MEQ/100ML IV SOLN
10.0000 meq | INTRAVENOUS | Status: AC
  Administered 2014-05-18 (×4): 10 meq via INTRAVENOUS
  Filled 2014-05-18 (×4): qty 100

## 2014-05-18 MED ORDER — AZITHROMYCIN 500 MG PO TABS
500.0000 mg | ORAL_TABLET | Freq: Every day | ORAL | Status: AC
  Administered 2014-05-19 – 2014-05-20 (×2): 500 mg via ORAL
  Filled 2014-05-18 (×2): qty 1

## 2014-05-18 NOTE — Progress Notes (Signed)
Report given to Rn 3E. Will transfer via [place]. Patient with no complaints at the current time.

## 2014-05-18 NOTE — Progress Notes (Signed)
CRITICAL VALUE ALERT  Critical value received:  Co2 46  Date of notification:  05/18/14  Time of notification:  0345  Critical value read back:Yes.    Nurse who received alert:  Verlin Grills  MD notified (1st page):  Mallory  Time of first page:  0400

## 2014-05-18 NOTE — Progress Notes (Signed)
Patient seen and examined. Case d/w residents in detail. I agree with findings and plan as documented in Dr. Ferd Glassing note.  She still doesn't feel well but states breathing is better and oxygenation has improved. Would c/w duonebs, prednisone and lasix. Complete 7 day course of antibiotics.  afib now rate controlled. C/w diltiazem.

## 2014-05-18 NOTE — Progress Notes (Signed)
        Subjective:   She is sitting on the bed appears in NAD.  She is satting 100% on 4L [place].  She states she doesn't feel well and is still SOB.     Objective:   Filed Vitals:   05/18/14 0600  BP: 131/103  Pulse:   Temp:   Resp: 25   Physical Exam: Constitutional: Vital signs reviewed. Patient is sitting up in bed in NAD.    HEENT: [place]/AT; EOMI, conjunctivae normal, no scleral icterus  Cardiovascular: irregularly irregular rhythm, no MRG Pulmonary/Chest: Improved lung exam with mild diffuse wheezing and with mild basilar crackles.   Abdominal: Soft. +BS, NT/ND Neurological: A&O x3, CN II-XII grossly intact; non-focal exam Extremities: 2+DP b/l, no b/l pitting edema Skin: Warm, dry. Multiple excoriations.    Assessment/Plan:   Principal Problem:  Influenza due to influenza A virus Active Problems:  HLD (hyperlipidemia)  Atrial fibrillation with rapid ventricular response  COPD (chronic obstructive pulmonary disease)  Osteoarthritis  Long term current use of opiate analgesic   Acute on chronic hypercarbic respiratory failure- Likely d/t AECOPD and viral PNA vs. bacterial.  She is satting 100% on NRB.  Repeat CXR yesterday  showed persistent b/l infiltrates, prominent in R lung.  Cannot exclude small effusion.  Persistent cardiomegaly without venous congestion.  Good UOP.   -cont vanc/cefepime (day 6) azithromycin (day 5)-->will complete 7 day course  -cont duonebs, Id/c solumedrol -cont home dose of lasix -cont prednisone  -keep sats 88-95%   -can transfer out of SDU today  -will decrease O2 [place] to 3L (should be on home 2L which is baseline)   Influenza A- Pt influenza A positive.  Completed 5 day course of tamiflu.   -cont symptomatic tx  Hemoptysis- Improved.  Hgb stable. -cont monitoring H/H  COPD- -continue current meds -plans per above   Afib with RVR- HR controlled.  Likely exacerbated by influenza, ?PNA, albuterol, and continued  lasix.   Refuses anticoagulation.   -cont cardizem -monitor on telemetry  Right shoulder pain- -cont vicodin  -kpad  Disposition Disposition is deferred, awaiting improvement of current medical problems. Anticipated discharge in approximately 1-2 day(s). PT/OT following recommending SNF.  Jones Bales, MD PGY-2, Internal Medicine Teaching Service 7:25 AM

## 2014-05-19 ENCOUNTER — Inpatient Hospital Stay (HOSPITAL_COMMUNITY): Payer: Medicare Other

## 2014-05-19 LAB — BASIC METABOLIC PANEL
Anion gap: 11 (ref 5–15)
BUN: 13 mg/dL (ref 6–23)
CO2: 34 mmol/L — AB (ref 19–32)
Calcium: 9 mg/dL (ref 8.4–10.5)
Chloride: 94 mmol/L — ABNORMAL LOW (ref 96–112)
Creatinine, Ser: 0.68 mg/dL (ref 0.50–1.10)
GFR calc non Af Amer: 89 mL/min — ABNORMAL LOW (ref >90)
Glucose, Bld: 143 mg/dL — ABNORMAL HIGH (ref 70–99)
POTASSIUM: 3.8 mmol/L (ref 3.5–5.1)
Sodium: 139 mmol/L (ref 135–145)

## 2014-05-19 LAB — D-DIMER, QUANTITATIVE: D-Dimer, Quant: 2.52 ug/mL-FEU — ABNORMAL HIGH (ref 0.00–0.48)

## 2014-05-19 LAB — CULTURE, BLOOD (ROUTINE X 2)
CULTURE: NO GROWTH
Culture: NO GROWTH

## 2014-05-19 LAB — TROPONIN I: Troponin I: <0.03 ng/mL (ref <0.031)

## 2014-05-19 MED ORDER — HYDROCODONE-HOMATROPINE 5-1.5 MG/5ML PO SYRP
5.0000 mL | ORAL_SOLUTION | ORAL | Status: DC | PRN
  Administered 2014-05-19 – 2014-05-21 (×4): 5 mL via ORAL
  Filled 2014-05-19 (×4): qty 5

## 2014-05-19 NOTE — Progress Notes (Signed)
Patient  C/o shortness of breath, alert oriented, also c/o R. Shoulder pain which is not new, patient said she is been taking therapy for the pain. Oxygen saturation 99% on 2L, BP soft but stable. Patient seem calm, no respiratory distress. Respiratory medication due given,  Anxiety med and pain medication also given. MD notified. 12 lead EKG done per MD. See manage order for xray and lab. Will continue to monitor patient.

## 2014-05-19 NOTE — Progress Notes (Signed)
Called by primary RN to evaluate patient for respiratory distress.  Upon my arrival to patients room, patient getting ready to got to radiology for test.  VSS, sats 100% on 2lpm,RR 14. Patient sitting up in bed leaning forward.  Patient states she is having trouble breathing and is anxious and feels like there is a knot in her stomach.  Breath Sounds diminished with fine crackles at bases.  Patient does not appear to be in respiratory distress.  No interventions from  RRT standpoint, recommend calling MD if still concerned.  RN to call if assistance needed

## 2014-05-19 NOTE — Progress Notes (Signed)
        Subjective:   She is sitting on the bed appears in NAD.  She is satting 98% on her home dose 2L [place].  States her breathing is better.     Objective:   Filed Vitals:   05/19/14 0945  BP: 124/67  Pulse: 105  Temp:   Resp:    Physical Exam: Constitutional: Vital signs reviewed. Patient is sitting up in bed in NAD.    HEENT: [place]/AT; EOMI, conjunctivae normal, no scleral icterus  Cardiovascular: irregularly irregular rhythm, no MRG Pulmonary/Chest: CTAB.    Abdominal: Soft. +BS, NT/ND Neurological: A&O x3, CN II-XII grossly intact; non-focal exam Extremities: 2+DP b/l, no b/l pitting edema Skin: Warm, dry. Multiple excoriations.    Assessment/Plan:   Principal Problem:  Influenza due to influenza A virus Active Problems:  HLD (hyperlipidemia)  Atrial fibrillation with rapid ventricular response  COPD (chronic obstructive pulmonary disease)  Osteoarthritis  Long term current use of opiate analgesic   Acute on chronic hypercarbic respiratory failure- Likely d/t AECOPD and viral PNA vs. bacterial.  She is satting 98% on 2 L [place] which is her home oxygen requirement.  Good UOP.   -cont vanc/cefepime (day 7) azithromycin (day 6)-->will complete 7 day course  -cont duonebs -cont home dose of lasix -cont prednisone  -keep sats 88-95%   -ambulate with pulse ox  -cont 2L [place]   Influenza A- Pt influenza A positive.  Completed 5 day course of tamiflu.   -cont symptomatic tx  Hemoptysis- Improved.  Hgb stable. -cont monitoring H/H  COPD- -continue current meds -plans per above   Afib with RVR- HR controlled.  Likely exacerbated by influenza, ?PNA, albuterol, and continued lasix.   Refuses anticoagulation.   -cont cardizem -monitor on telemetry  Right shoulder pain- -cont vicodin  -kpad  Disposition Anticipated discharge tomorrow.  PT/OT following recommending SNF but unsure if she is willing to go.  Will ask CSW to come by and arrange plans  for d/c.   Jones Bales, MD PGY-2, Internal Medicine Teaching Service 12:07 PM

## 2014-05-19 NOTE — Progress Notes (Signed)
ANTIBIOTIC CONSULT NOTE - FOLLOW UP  Pharmacy Consult for vancomycin/cefepime Indication: PNA  Allergies  Allergen Reactions  . Ace Inhibitors Cough  . Diphenhydramine Hcl Other (See Comments)    "feels like my skin is crawling, and legs twitches"  . Erythromycin Diarrhea  . Nsaids Nausea And Vomiting  . Nyquil [Pseudoeph-Doxylamine-Dm-Apap] Itching  . Tramadol Hcl Other (See Comments)    stomach pain, hallucinations  . Flexeril [Cyclobenzaprine] Anxiety  . Pseudoephedrine Palpitations    Patient Measurements: Height: 5\' 5"  (165.1 cm) Weight: 185 lb 9.6 oz (84.188 kg) (Scale B) IBW/kg (Calculated) : 57  Vital Signs: Temp: 98.1 F (36.7 C) (04/09 0445) Temp Source: Oral (04/09 0445) BP: 124/67 mmHg (04/09 0945) Pulse Rate: 105 (04/09 0945) Intake/Output from previous day: 04/08 0701 - 04/09 0700 In: 5009 [P.O.:880; IV Piggyback:850] Out: 700 [Urine:700] Intake/Output from this shift: Total I/O In: 360 [P.O.:360] Out: -   Labs:  Recent Labs  05/17/14 0354 05/18/14 0226 05/18/14 0927 05/19/14 0352  WBC 5.1 9.3  --   --   HGB 11.6* 12.2  --   --   PLT 165 238  --   --   CREATININE 0.51 0.63 0.61 0.68   Estimated Creatinine Clearance: 73.1 mL/min (by C-G formula based on Cr of 0.68).  Recent Labs  05/16/14 1508  [place] 16.9     Microbiology: Recent Results (from the past 720 hour(s))  Culture, blood (routine x 2)     Status: None   Collection Time: 05/10/14  8:05 PM  Result Value Ref Range Status   Specimen Description BLOOD RIGHT ARM  Final   Special Requests BOTTLES DRAWN AEROBIC AND ANAEROBIC 5CC  Final   Culture   Final    NO GROWTH 5 DAYS Performed at [facility]    Report Status 05/17/2014 FINAL  Final  Culture, blood (routine x 2)     Status: None   Collection Time: 05/10/14  8:17 PM  Result Value Ref Range Status   Specimen Description BLOOD RIGHT HAND  Final   Special Requests BOTTLES DRAWN AEROBIC AND ANAEROBIC 5CC  Final    Culture   Final    NO GROWTH 5 DAYS Performed at [facility]    Report Status 05/17/2014 FINAL  Final  Urine culture     Status: None   Collection Time: 05/10/14  8:37 PM  Result Value Ref Range Status   Specimen Description URINE, RANDOM  Final   Special Requests NONE  Final   Colony Count   Final    40,000 COLONIES/ML Performed at [facility]    Culture   Final    Multiple bacterial morphotypes present, none predominant. Suggest appropriate recollection if clinically indicated. Performed at [facility]    Report Status 05/12/2014 FINAL  Final  MRSA PCR Screening     Status: None   Collection Time: 05/11/14 11:29 AM  Result Value Ref Range Status   MRSA by PCR NEGATIVE NEGATIVE Final    Comment:        The GeneXpert MRSA Assay (FDA approved for NASAL specimens only), is one component of a comprehensive MRSA colonization surveillance program. It is not intended to diagnose MRSA infection nor to guide or monitor treatment for MRSA infections.   Culture, blood (routine x 2)     Status: None   Collection Time: 05/13/14  2:30 AM  Result Value Ref Range Status   Specimen Description BLOOD LEFT ARM  Final   Special  Requests BOTTLES DRAWN AEROBIC AND ANAEROBIC 5CC EACH  Final   Culture   Final    NO GROWTH 5 DAYS Performed at [facility]    Report Status 05/19/2014 FINAL  Final  Culture, blood (routine x 2)     Status: None   Collection Time: 05/13/14  2:45 AM  Result Value Ref Range Status   Specimen Description BLOOD LEFT HAND  Final   Special Requests BOTTLES DRAWN AEROBIC AND ANAEROBIC 5CC EACH  Final   Culture   Final    NO GROWTH 5 DAYS Performed at [facility]    Report Status 05/19/2014 FINAL  Final    Anti-infectives    Start     Dose/Rate Route Frequency Ordered Stop   05/19/14 1000  azithromycin (ZITHROMAX) tablet 500 mg     500 mg Oral Daily 05/18/14 2128 05/21/14 0959   05/14/14 0800  azithromycin  (ZITHROMAX) 500 mg in dextrose 5 % 250 mL IVPB  Status:  Discontinued     500 mg 250 mL/hr over 60 Minutes Intravenous Every 24 hours 05/14/14 0654 05/18/14 2128   05/13/14 0230  vancomycin (VANCOCIN) IVPB 1000 mg/200 mL premix     1,000 mg 200 mL/hr over 60 Minutes Intravenous Every 12 hours 05/13/14 0206     05/13/14 0215  piperacillin-tazobactam (ZOSYN) IVPB 3.375 g  Status:  Discontinued     3.375 g 12.5 mL/hr over 240 Minutes Intravenous Every 8 hours 05/13/14 0205 05/13/14 0208   05/13/14 0215  ceFEPIme (MAXIPIME) 2 g in dextrose 5 % 50 mL IVPB     2 g 100 mL/hr over 30 Minutes Intravenous Every 12 hours 05/13/14 0209     05/11/14 2300  cefTRIAXone (ROCEPHIN) 1 g in dextrose 5 % 50 mL IVPB  Status:  Discontinued     1 g 100 mL/hr over 30 Minutes Intravenous Every 24 hours 05/11/14 0144 05/11/14 1710   05/11/14 1345  oseltamivir (TAMIFLU) capsule 75 mg     75 mg Oral 2 times daily 05/11/14 1321 05/15/14 2234   05/11/14 1000  doxycycline (VIBRA-TABS) tablet 100 mg  Status:  Discontinued     100 mg Oral Every 12 hours 05/11/14 0141 05/11/14 1711   05/10/14 2300  cefTRIAXone (ROCEPHIN) 1 g in dextrose 5 % 50 mL IVPB     1 g 100 mL/hr over 30 Minutes Intravenous  Once 05/10/14 2248 05/10/14 2347   05/10/14 2300  doxycycline (VIBRA-TABS) tablet 100 mg     100 mg Oral  Once 05/10/14 2248 05/10/14 2311      Assessment: 69 yo female with HCAP on vancomycin/zosyn (also noted on azithromycin). Patient is afebrile. Noted plans for 7 days of antibiotics per MD notes (today is day 7 vancomycin and cefepime).  Spoke with MD and the team is re-considering the length of therapy.  3/31 Blood cx > neg 4/3 Blood Cx > neg 3/31 Urine cx > 40k of Multiple bacteria  4/6 VT 16 - no change 4/3 Cefepime >  4/3 Vancomycin > 4/4 Azithromycin > (4/10) 4/1 Oseltamivir >4/6  Goal of Therapy:  Vancomycin trough level 15-20 mcg/ml  Plan:  -No cefepime/vancomycin changes needed -Will follow plans for  length of therapy -Will follow renal function and clinical progress   Hildred Laser, Pharm D 05/19/2014 10:45 AM

## 2014-05-19 NOTE — Progress Notes (Signed)
Subjective: Kelsey Garcia. Was able to be weaned from 6L by [place] to 3L with sats in the high 90s. Afebrile with some improvement in tachycardia.   Patient reports that she is "doing ok." She thinks her breathing is a bit better, but she is still wheezing and her cough is annoying. She has been able to eat a "little bit." Has been up to the bathroom, and thinks her SOB is worse with getting up.   She is unsure about going to a SNF for rehab. She has discussed pulmonary rehab with her providers before, but her insurance wouldn't cover it at the time.   Objective: Vital signs in last 24 hours: Filed Vitals:   05/18/14 2136 05/19/14 0100 05/19/14 0215 05/19/14 0445  BP: 101/57 121/79  127/64  Pulse: 86 93  89  Temp: 97.6 F (36.4 C) 97.7 F (36.5 C)  98.1 F (36.7 C)  TempSrc: Oral Oral  Oral  Resp: 18 18  20   Height:      Weight:    84.188 kg (185 lb 9.6 oz)  SpO2: 99% 98% 99% 100%   Weight change:   Intake/Output Summary (Last 24 hours) at 05/19/14 2585 Last data filed at 05/19/14 0118  Gross per 24 hour  Intake   1390 ml  Output    700 ml  Net    690 ml   General: Lying down in bed in no acute distress.  CV: Irregular rhythm, no murmurs auscultated.  Pulm: Improved air movement; transmitted upper airway sounds.  Abdomen: Soft, non-tender. Hypoactive bowel sounds.  Psych: Restricted affect; responds to questions in short or one word answers.   Lab Results: Basic Metabolic Panel:  Recent Labs  05/18/14 0447 05/18/14 0927 05/19/14 0352  NA  --  140 139  K  --  3.6 3.8  CL  --  92* 94*  CO2  --  41* 34*  GLUCOSE  --  142* 143*  BUN  --  11 13  CREATININE  --  0.61 0.68  CALCIUM  --  9.0 9.0  MG 2.1  --   --   CBC:  Recent Labs  05/17/14 0354 05/18/14 0226  WBC 5.1 9.3  HGB 11.6* 12.2  HCT 36.2 37.8  MCV 97.3 95.9  PLT 165 238   CBG:  Recent Labs  05/16/14 1748 05/17/14 1129 05/17/14 1758 05/18/14 0803 05/18/14 1203 05/18/14 1646  GLUCAP 186* 135* 170*  135* 149* 236*   Micro Results: Reviewed in EPIC. Please see chart for full details.   Medications:  Scheduled Meds: . antiseptic oral rinse  7 mL Mouth Rinse BID  . azithromycin  500 mg Oral Daily  . busPIRone  10 mg Oral TID  . ceFEPime (MAXIPIME) IV  2 g Intravenous Q12H  . diltiazem  420 mg Oral Daily  . FLUoxetine  60 mg Oral Daily  . furosemide  80 mg Oral BID  . guaiFENesin  600 mg Oral BID  . ipratropium-albuterol  3 mL Nebulization Q6H WA  . pantoprazole  40 mg Oral Daily  . potassium chloride  40 mEq Oral BID  . predniSONE  40 mg Oral Q breakfast  . vancomycin  1,000 mg Intravenous Q12H   Continuous Infusions:  PRN Meds:.albuterol, ALPRAZolam, benzonatate, HYDROcodone-acetaminophen, white petrolatum   Assessment/Plan: Mrs Rance is a 69 yo female with a PMH significant for gold stage III COPD, atrial fibrillation, possible HFpEF, OCD, GERD, osteoarthritis, HTN and hyperlipidemia who is now undergoing treatment for influenza, possible  HCAP, atrial fibrillation with RVR, along with significant anxiety and pain.   Principal Problem:   Acute on chronic respiratory failure Active Problems:   HLD (hyperlipidemia)   Atrial fibrillation with rapid ventricular response   COPD (chronic obstructive pulmonary disease)   Osteoarthritis   Anxiety   Long term current use of opiate analgesic   Chronic diastolic CHF (congestive heart failure)   HCAP (healthcare-associated pneumonia)   Influenza due to influenza A virus   Pancytopenia   Hemoptysis   Mediastinal and hilar lymphadenopathy on CT  ** Possible HCAP, influenza, possible volume overload: Now s/p 5 day course of oseltamivir for influenza, and completing a 7 day course of vancomycin/cefepime for possible HCAP. Slowly weaned from partial NRB with significant improvement in oxygenation with antibiotics, steroids, and diuresis. It is unclear if her underlying etiology was a viral pneumonia with subsequent ARDS type reaction vs  HCAP. Now weaning to home oxygen requirement on nasal cannula. Influenza A positive, HIV antibody negative, legionella urinary antigen negative, Strep pneumoniae urinary antigen negative.  -- Pulmonary-critical care c/s; appreciate reccs -- Now on day 7/7 of vancomycin/cefepime, will d/c today -- Day 6/7 of azithromycin -- On home oral lasix 80 mg BID -- Blood cultures from 3/31 and from 4/3 with no growth for 5 days -- Decreasing from prednisone 60 mg to 40 mg today -- Will continue duonebs -- Will continue to monitor sats; now on 2L by [place] at baseline  ** Anemia, thrombocytopenia: Hemoglobin down-trended over the course of admission in the setting of hemoptysis and bleeding from hemorrhoids.Heparin gtt started for atrial fibrillation with RVR was discontinued on 4/2. Platelets also down-trended. Both anemia and thrombocytopenia have improved now, associated with normalizing white count. Unclear if this is due to concentration/diluation.  -- If continues outside the setting of acute infection, may need follow-up as an outpatient  ** Atrial fibrillation with RVR: Tachycardic to 140s on admission in the setting of missing home diltiazem. S/p diltiazem drip, now on home po dose. Rate improved now. TSH mildly low at 0.293. T4 WNL at 1.04.  -- Monitor on telemetry -- Off heparin drip due to declining hemoglobin -- Home diltiazem (tiazac 24 hr capsule) 420 mg daily -- Hemoglobin A1c 5.7, sightly elevated with no history of diabetes, will likely need follow- up as an outpatient  ** COPD: GOLD stage III.  -- Continue DuoNebs Q6 hours -- Antiseptic oral rinse with steroid inhalers  ** HFpEF: Unclear history of CHF.  -- Continue Aspirin 81 mg -- Lasix as above  ** Hypertension: Appears well controlled.  -- Continue home dilltiazem  ** Hyperlipidemia: Lipid panel 10/12/2013 notable for total cholesterol 194, HDL 52, LDL 127, triglycerides 76. She is not on any statin therapy. -- Defer  starting statin therapy as outpatient  ** Osteoarthritis: Complex pain management with previously being discharged from our clinic for becoming verbally abusive when discussing pain medications.  -- On norco, slightly lower dose than home dose -- Provide her with resources to establish PCP care elsewhere  ** OCD: She follows with a therapist [facility]. Currently stated mood of panic could certainly be contributed to by solumedrol; however, value of steroids in treating her respiratory status likely outweighs side effects.  -- Continue home buspar, prozac -- Now on xanax 0.25 mg TID for anxiety from home valium -- Will try to contact outpatient therapist per patient request -- Will defer any changes to outpatient psychiatric follow-up   **FEN/GI: -- Diet: Heart diet -- On  potassium as an outpatient; 3.8 today  ** PPx:  -- DVT: SCDs -- Home pantoprazole  ** Dispo:  -- Full code -- Will need resources to establish PCP prior to discharge -- Will need cardiology follow-up scheduled prior to discharge  -- Will need pulmonology follow-up scheduled with [name] with pulmonary prior to discharge -- PT recommended possible short-term SNF placement to aid with mobility  This is a Careers information officer Note.  The care of the patient was discussed with Dr. Gordy Levan and the assessment and plan formulated with their assistance.  Please see their attached note for official documentation of the daily encounter.   LOS: 8 days   Dierdre Forth, Med Student 05/19/2014, 6:33 AM  [phone]

## 2014-05-19 NOTE — Progress Notes (Signed)
2:00 PM  Interval Progress Note:   Subjective: We were paged by the RN stating Kelsey Garcia was having increased SOB.  When asked if she is having chest pain she states "maybe a little."  She denies any N/V or diaphoresis.  She is sitting up in bed on 2L [place] (with sats of 100%) which is her home oxygen requirement.  She is slightly tachycardic at 105.    Physical Exam:  Constitutional: VS reviewed.  Pt appears in NAD. Cardiovascular: tachycardic, irregularly irregular rhythm  Pulmonary/Chest: normal effort, mild bibasilar crackles   Abdominal: Soft. Non-tender, non-distended Neurological: A&O x3, cranial nerve II-XII are grossly intact, no focal motor deficit, sensory intact to light touch bilaterally.  Skin: Warm, dry and intact. Multiple excoriations.   Psychiatric: Affect flat.    Plan:  -stat CXR, EKG, trop, D-dimer  -albuterol neb NOW

## 2014-05-20 ENCOUNTER — Inpatient Hospital Stay (HOSPITAL_COMMUNITY): Payer: Medicare Other

## 2014-05-20 ENCOUNTER — Encounter (HOSPITAL_COMMUNITY): Payer: Self-pay | Admitting: Radiology

## 2014-05-20 LAB — BASIC METABOLIC PANEL
ANION GAP: 13 (ref 5–15)
BUN: 12 mg/dL (ref 6–23)
CHLORIDE: 95 mmol/L — AB (ref 96–112)
CO2: 32 mmol/L (ref 19–32)
Calcium: 8.8 mg/dL (ref 8.4–10.5)
Creatinine, Ser: 0.64 mg/dL (ref 0.50–1.10)
GFR calc Af Amer: >90 mL/min (ref >90)
Glucose, Bld: 92 mg/dL (ref 70–99)
Potassium: 4.1 mmol/L (ref 3.5–5.1)
Sodium: 140 mmol/L (ref 135–145)

## 2014-05-20 LAB — CBC
HEMATOCRIT: 41.1 % (ref 36.0–46.0)
Hemoglobin: 13 g/dL (ref 12.0–15.0)
MCH: 30.8 pg (ref 26.0–34.0)
MCHC: 31.6 g/dL (ref 30.0–36.0)
MCV: 97.4 fL (ref 78.0–100.0)
PLATELETS: 268 10*3/uL (ref 150–400)
RBC: 4.22 MIL/uL (ref 3.87–5.11)
RDW: 14.9 % (ref 11.5–15.5)
WBC: 10.3 10*3/uL (ref 4.0–10.5)

## 2014-05-20 MED ORDER — IPRATROPIUM-ALBUTEROL 0.5-2.5 (3) MG/3ML IN SOLN
3.0000 mL | Freq: Three times a day (TID) | RESPIRATORY_TRACT | Status: DC
  Administered 2014-05-20 – 2014-05-22 (×8): 3 mL via RESPIRATORY_TRACT
  Filled 2014-05-20 (×8): qty 3

## 2014-05-20 MED ORDER — IOHEXOL 350 MG/ML SOLN
100.0000 mL | Freq: Once | INTRAVENOUS | Status: AC | PRN
  Administered 2014-05-20: 100 mL via INTRAVENOUS
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
## 2014-05-20 NOTE — Clinical Social Work Psychosocial (Signed)
Clinical Social Work Department BRIEF PSYCHOSOCIAL ASSESSMENT 05/20/2014  Patient:  Kelsey Garcia, Kelsey Garcia     Account Number:  [id number]     Admit date:  05/10/2014  Clinical Social Worker:  Lovey Newcomer  Date/Time:  05/20/2014 10:50 AM  Referred by:  Physician  Date Referred:  05/20/2014 Referred for  SNF Placement   Other Referral:   NA   Interview type:  Patient Other interview type:   Patient alert and oriented at time of assessment.    PSYCHOSOCIAL DATA Living Status:  FAMILY Admitted from facility:   Level of care:   Primary support name:  Kelsey Garcia Primary support relationship to patient:  CHILD, ADULT Degree of support available:   Support is good.    CURRENT CONCERNS Current Concerns  Post-Acute Placement   Other Concerns:   NA    SOCIAL WORK ASSESSMENT / PLAN CSW met with patient at bedside to complete assessment. Patient presents with flat affect and limited engagement in assessment. She states that she was admitted from home where she lives with her daughter. Ideally patient would like to return home at discharge, but she states she understands that SNF is likely her only option at this time. Patient reports that her daughter is her main support. In addition to living at home with daughter, patient states that she also receives outpatient PT from [facility].    CSW explained SNF search/placement process and answered patient's questions. Patient prefers [facility] if they have a bed. CSW will follow up with any available bed offers.   Assessment/plan status:  Psychosocial Support/Ongoing Assessment of Needs Other assessment/ plan:   Complete Fl2, Fax, PASRR   Information/referral to community resources:   CSW contact information and SNF list given.    PATIENT'S/FAMILY'S RESPONSE TO PLAN OF CARE: Patient states that she is agreeable to SNF placement at discharge if necessary. Patient requests an estimated amount of time she will have to stay at  [facility]. CSW explained this will depend on her progress. CSW will assist.       Liz Beach MSW, [place], [place], [phone]

## 2014-05-20 NOTE — Clinical Social Work Note (Addendum)
Patient has bed at [facility]. MD please write DC order, DC summary, sign FL2 on chart, and sign any narcotics the patient will DC with.   CSW received call from MD stating that the patient may not DC today unless psych clears the patient for discharge. CSW explained to MD that CSW needs to know by 3:30PM whether patient will DC to [facility] today so that arrangements can be made.   CSW received a call from RN. RN stated that she should have been told patient was discharging today and that transport had been scheduled for patient. Transport has not been scheduled for the patient and CSW explained that if patient was in fact discharging today, the RN would have been notified of this well in advance of the time for pickup. Patient will have to DC once psych clears. CSW has spoken with daughter Levada Dy regarding cancellation of discharge and reason for this. Daughter Levada Dy was very understanding and appreciative of CSWs continued efforts and assistance with DC planning. CSW has left report for weekday   Liz Beach MSW, [place], [place], [phone]

## 2014-05-20 NOTE — Progress Notes (Signed)
        Subjective:   She is sitting on the bed states her breathing has improved.  She appears less anxcious.    Objective:   Filed Vitals:   05/20/14 1009  BP: 135/64  Pulse:   Temp:   Resp:    Physical Exam: Constitutional: Vital signs reviewed. Patient is sitting up in bed in NAD.    HEENT: [place]/AT; EOMI, conjunctivae normal, no scleral icterus  Cardiovascular: irregularly irregular rhythm, no MRG Pulmonary/Chest: CTAB.    Abdominal: Soft. +BS, NT/ND Neurological: A&O x3, CN II-XII grossly intact; non-focal exam Extremities: 2+DP b/l, no b/l pitting edema Skin: Warm, dry. Multiple excoriations.    Assessment/Plan:   Principal Problem:  Influenza due to influenza A virus Active Problems:  HLD (hyperlipidemia)  Atrial fibrillation with rapid ventricular response  COPD (chronic obstructive pulmonary disease)  Osteoarthritis  Long term current use of opiate analgesic   Acute on chronic hypercarbic respiratory failure- Likely d/t AECOPD and viral PNA vs. bacterial.  She is satting 98% on 2 L [place] which is her home oxygen requirement.  Good UOP.Completed vanc/cefepime for total of 7 days. Today is last day of azithromycin, also 7 days total.  Breathing is back to baseline.  She had an episode yesterday of increased dyspnea likely d/t anxiety-->CXR was without acute changes, EKG no changes, and troponin neg.  D-dimer elevated but CTA showed no PE.   -cont duonebs -cont home dose of lasix -cont prednisone  -keep sats 88-95%   -ambulate with pulse ox  -cont 2L [place]  -likely d/c to SNF today  -f/u with [facility]   Anxiety- Pt expressed some increased anxiety but without SI or HI.   -consulted psychiatry to see Ms. Principato but also follows with [facility] which she could f/u with as an outpatient   Influenza A- Resolved.  Pt influenza A positive.  Completed 5 day course of tamiflu.   -cont symptomatic tx  Hemoptysis- Improved.  Hgb stable. -cont monitoring  H/H  COPD- -continue current meds -plans per above   Afib with RVR- HR controlled.  Likely exacerbated by influenza, ?PNA, albuterol, and continued lasix.   Refuses anticoagulation.   -cont cardizem  Right shoulder pain- -cont vicodin  -kpad  Disposition Anticipated discharge today.  PT/OT following recommending SNF but unsure if she is willing to go.  Will ask CSW to come by and arrange plans for d/c.   Jones Bales, MD PGY-2, Internal Medicine Teaching Service 12:26 PM

## 2014-05-20 NOTE — Clinical Social Work Placement (Signed)
Clinical Social Work Department CLINICAL SOCIAL WORK PLACEMENT NOTE 05/20/2014  Patient:  Kelsey Garcia, Kelsey Garcia  Account Number:  [id number] Admit date:  05/10/2014  Clinical Social Worker:  Kemper Durie, [place]  Date/time:  05/20/2014 11:00 AM  Clinical Social Work is seeking post-discharge placement for this patient at the following level of care:   [facility]   (*CSW will update this form in Epic as items are completed)   05/20/2014  Patient/family provided with [facility] Department of Clinical Social Work's list of facilities offering this level of care within the geographic area requested by the patient (or if unable, by the patient's family).  05/20/2014  Patient/family informed of their freedom to choose among providers that offer the needed level of care, that participate in Medicare, Medicaid or managed care program needed by the patient, have an available bed and are willing to accept the patient.  05/20/2014  Patient/family informed of [facility]' ownership interest in [facility], as well as of the fact that they are under no obligation to receive care at this facility.  PASARR submitted to EDS on 05/20/2014 PASARR number received on 05/20/2014  FL2 transmitted to all facilities in geographic area requested by pt/family on  05/20/2014 FL2 transmitted to all facilities within larger geographic area on 05/20/2014  Patient informed that his/her managed care company has contracts with or will negotiate with  certain facilities, including the following:     Patient/family informed of bed offers received:   Patient chooses bed at  Physician recommends and patient chooses bed at    Patient to be transferred to  on   Patient to be transferred to facility by  Patient and family notified of transfer on  Name of family member notified:    The following physician request were entered in Epic:   Additional Comments:    Liz Beach MSW, [place], [place],  [phone]

## 2014-05-20 NOTE — Consult Note (Signed)
[name] is a 69 year old female with GERD, OCD, osteoarthritis, hypertension, hyperlipidemia, history of diastolic CHF, atrial fibrillation, COPD GOLD Stage III who presented with shortness of breath.  Pt is in better mood. She denies any suicidal thoughts or safety concerns now. Per nursing she did not mentions any safety concerns either. Per nursing she is confused at times. Pt knows why she came here and she is able to follow treatment recommendations. She admits sad mood and anxiety problems at times.   Behavioral Health  Exam    Psychiatric Specialty Exam: Physical Exam  ROS  Blood pressure 115/71, pulse 92, temperature 98.7 F (37.1 C), temperature source Oral, resp. rate 18, height 5\' 5"  (1.651 m), weight 82.4 kg (181 lb 10.5 oz), SpO2 98 %.Body mass index is 30.23 kg/(m^2).  General Appearance: Casual  Eye Contact::  Fair  Speech:  Slow  Volume:  Normal  Mood:  Anxious  Affect:  Blunt  Thought Process:  Logical  Orientation:  Full (Time, Place, and Person)  Thought Content:  Rumination  Suicidal Thoughts:  No  Homicidal Thoughts:  No  Memory:  poor  Judgement:  Fair  Insight:  Fair  Psychomotor Activity:  Decreased  Concentration:  Poor  Recall:  Poor  Fund of Knowledge:Poor  Language: Fair  Akathisia:  No  Handed:    AIMS (if indicated):     Assets:  Resilience  Sleep:        Dx:  anxiety d/o unspecified, rule out delirum  Recomendations:   Pt denies any safety concerns at this time.  Will out recoment out patient treatment including therapy to deal with her chronic difficulties and psy meds Per nursing pt was on valium before she came here. Pt might need slow tapper off from benzo if she was on Valium.

## 2014-05-20 NOTE — Progress Notes (Signed)
Physical Therapy Treatment Patient Details Name: Kelsey Garcia MRN: [id number] DOB: [birth year] Today's Date: 05/20/2014    History of Present Illness 69 y.o. female, influenza A positive and Afib with RVR.    PT Comments    Pt continues to be appropriate for SNF level of rehab at D/C.  Pt fatigues very quickly and was only able to stand 10 seconds x2 before LE began trembling and pt c/o being too weak.  At this time pt is not safe for D/C to home.  Will continue to follow.    Follow Up Recommendations  SNF;Supervision for mobility/OOB     Equipment Recommendations  Rolling walker with 5" wheels    Recommendations for Other Services       Precautions / Restrictions Precautions Precautions: Fall Precaution Comments: watch sats Restrictions Weight Bearing Restrictions: No    Mobility  Bed Mobility Overal bed mobility: Needs Assistance Bed Mobility: Supine to Sit;Sit to Supine     Supine to sit: Min guard Sit to supine: Min guard   General bed mobility comments: cuesf or encouragement and sequencing.    Transfers Overall transfer level: Needs assistance Equipment used: Rolling walker (2 wheeled) Transfers: Sit to/from Stand Sit to Stand: Min assist         General transfer comment: A for power up to stand and for use of UEs, but pt continues to pull on RW with coming to stand.  Uncontrolled descent to sitting.    Ambulation/Gait                 Stairs            Wheelchair Mobility    Modified Rankin (Stroke Patients Only)       Balance Overall balance assessment: Needs assistance Sitting-balance support: No upper extremity supported;Feet supported Sitting balance-Leahy Scale: Good     Standing balance support: Bilateral upper extremity supported Standing balance-Leahy Scale: Poor Standing balance comment: pt leans on RW 2/2 DOE and weakness.  Only able to stand ~10 seconds.                      Cognition Arousal/Alertness:  Awake/alert Behavior During Therapy: Flat affect Overall Cognitive Status: No family/caregiver present to determine baseline cognitive functioning                 General Comments: pt cognition seems improved from previous session, but very flat affect and difficult to motivate.      Exercises      General Comments        Pertinent Vitals/Pain Pain Assessment: 0-10 Pain Score: 5  Pain Location: R shoulder Pain Descriptors / Indicators: Aching Pain Intervention(s): Monitored during session;Premedicated before session;Repositioned    Home Living                      Prior Function            PT Goals (current goals can now be found in the care plan section) Acute Rehab PT Goals Patient Stated Goal: Go home PT Goal Formulation: With patient Time For Goal Achievement: 05/26/14 Potential to Achieve Goals: Good Progress towards PT goals: Progressing toward goals    Frequency  Min 3X/week    PT Plan Current plan remains appropriate    Co-evaluation             End of Session Equipment Utilized During Treatment: Gait belt;Oxygen Activity Tolerance: Patient limited by fatigue Patient left: in  bed;with call bell/phone within reach;with bed alarm set     Time: 1100-1120 PT Time Calculation (min) (ACUTE ONLY): 20 min  Charges:  $Therapeutic Activity: 8-22 mins                    G CodesCatarina Hartshorn, [place] 05/20/2014, 12:23 PM

## 2014-05-21 DIAGNOSIS — J9600 Acute respiratory failure, unspecified whether with hypoxia or hypercapnia: Secondary | ICD-10-CM | POA: Insufficient documentation

## 2014-05-21 MED ORDER — SODIUM CHLORIDE 0.9 % IV SOLN
INTRAVENOUS | Status: AC
  Administered 2014-05-21: 18:00:00 via INTRAVENOUS

## 2014-05-21 MED ORDER — POTASSIUM CHLORIDE CRYS ER 20 MEQ PO TBCR: 40.0000 meq | EXTENDED_RELEASE_TABLET | Freq: Two times a day (BID) | ORAL | Status: DC

## 2014-05-21 MED ORDER — PREDNISONE 20 MG PO TABS: 40.0000 mg | ORAL_TABLET | Freq: Every day | ORAL | Status: DC

## 2014-05-21 NOTE — Clinical Social Work Psych Note (Signed)
Psychiatry was consulted for SI.  Patient was evaluated on 05/20/2014 by MD Rasul who recommended outpatient therapy/medication management at time of discharge.  Patient payer source: Medicaid/Medicare.  [facility] visits are covered by Medicare.  Patient will be discharged to [facility] SN[facility] at time of discharge.  Patient does not feel that she needs outpatient follow-up at this time, but is agreeable to Psych CSW placing follow-up information on her discharge paperwork for her records.  Psych CSW added this information to patient's discharge summary.  Psych CSW signing off, but remains available if continued need should arise. Please re-consult as necessary.  Nonnie Done, [place] [phone]  Psychiatric & Orthopedics (5N 1-8) Clinical Social Worker

## 2014-05-21 NOTE — Progress Notes (Signed)
Normal saline to be started for dehydration. Rate is 75cc for 6 hours.

## 2014-05-21 NOTE — Discharge Instructions (Signed)
Please keep your follow-up appointments-->see this discharge summary for the dates/times.  This is very important for your continued recovery.   Please continue with a low sodium diet.    We have made the following additions/changes to your medications:  Please refer to your medication list.    Please continue to take all of your medications as prescribed.  Do not miss any doses without contacting your primary physician.  If you have questions, please contact your physician or contact the Internal Medicine Teaching Service at [phone].  Please bring your medicications with you to your appointments; medications may be eye drops, herbals, vitamins, or pills.    If you believe you are suffering from a life-threatening emergency, go to the nearest Emergency Department.      Liz [employer]orne Guide  1) Find a Charity fundraiser Although you won't have to find out who is covered by [employer] plan, it is a good idea to ask around and get recommendations. You will then need to call the office and see if the doctor you have chosen will accept you as a new patient and what types of options they offer for patients who are self-pay. Some doctors offer discounts or will set up payment plans for their patients who do not have insurance, but you will need to ask so you aren't surprised when you get to your appointment.  2) Contact Your Local Health Department Not all health departments have doctors that can see patients for sick visits, but many do, so it is worth a call to see if yours does. If you don't know where your local health department is, you can check in your phone book. The CDC also has a tool to help you locate your state's health department, and many state websites also have listings of all of their local health departments.  3) Find a [facility] If your illness is not likely to be very severe or complicated, you may want to try a walk in clinic. These are popping up all  over the country in pharmacies, drugstores, and shopping centers. They're usually staffed by nurse practitioners or physician assistants that have been trained to treat common illnesses and complaints. They're usually fairly quick and inexpensive. However, if you have serious medical issues or chronic medical problems, these are probably not your best option.  No Primary Care Doctor: - Call Health Connect at  [phone] - they can help you locate a primary care doctor that  accepts your insurance, provides certain services, etc. - Physician Referral Service- [phone]  Chronic Pain Problems: Organization         Address  Phone   Notes  [facility]  [phone] Patients need to be referred by their primary care doctor.   Medication Assistance: Organization         Address  Phone   Notes  [facility] Medication [facility] [address] [place], [place] [address] [phone] --Must be a resident of [facility] -- Must have NO insurance coverage whatsoever (no Medicaid/ Medicare, etc.) -- The pt. MUST have a primary care doctor that directs their care regularly and follows them in the community   MedAssist  [phone]   [employer]  [phone]    Agencies that provide inexpensive medical care: Organization         Address  Phone   Notes  [place]  [phone]   Gershon Mussel  [facility] Internal Medicine    [phone]   [facility] [place], [place] [address] [phone]   [place] [address] [place] [phone]   Planned Parenthood    [phone]   [facility]    [phone]   [place] and [place], [place] Phone:  [phone], Fax:  [phone] Hours of Operation:  9 am - 6 pm, M-F.  Also accepts Medicaid/Medicare and self-pay.  [facility] for [place] [address], [address], [place] Phone: [phone], Fax: [phone]. Hours of Operation:  8:30 am - 5:30 pm, M-F.  Also accepts Medicaid and self-pay.  [facility] [place] [address], [place] Phone: [phone]   [place], [place], [place] [phone], Ext. [phone] Mondays & Thursdays: 7-9 AM.  First 15 patients are seen on a first come, first serve basis.    [place] Providers:  Organization         Address  Phone   Notes  [facility] [address], [place] [phone] Also accepts self-pay patients.  [facility] [address], [place]  [phone]   [place], [address], [place] [phone]   [facility] [address] [place] [phone]   Lucianne Lei [address], [place]   [phone] Only accepts [place] Access [place] patients after they have their name applied to their card.   Self-Pay (no insurance) in [facility]:  Organization         Address  Phone   Notes  Sickle Cell Patients, [facility] Internal Medicine [place] [phone]   [facility] Urgent Care [place] [phone]   [facility] [place]  [place], [place], [place] [phone]   [facility]/Dr. Osei-Bonsu  [address] [place] or [address], [place] [phone] Phone number for both [place] and [place] locations is the same.  [facility] [address] [place] [phone]   [facility] [address], [place] or [address] [phone]   [facility] [address] [place] [phone], phone; [phone], fax Sees patients 1st and 3rd Saturday of every  month.  Must not qualify for public or private insurance (i.e. Medicaid, Medicare, [place] Health Choice, Veterans' Benefits)  Household income should be no more than 200% of the poverty level The clinic cannot treat you if you are pregnant or think you are pregnant  Sexually transmitted diseases are not treated at the clinic.    Dental Care: Organization         Address  Phone  Notes  [facility] Department of [facility] [place] [phone] Accepts children up to age 40 who are enrolled in [place] or [place]; pregnant women with a Medicaid card; and children who have applied for Medicaid or  Health Choice, but were declined, whose parents can pay a reduced fee at time of service.  [facility] Department of [facility]  [address], [employer]  [place] [phone] Accepts children up to age 28 who are enrolled in Medicaid or [place]; pregnant women with a Medicaid card; and children who have applied for Medicaid or [place] Health Choice, but were declined, whose parents can pay a reduced fee at time of service.  [facility] Adult Dental Access PROGRAM  [place] [phone] Patients are seen by appointment only. Walk-ins are not accepted. [place] will see patients 8 years of age and older. Monday - Tuesday (8am-5pm) Most Wednesdays (8:30-5pm) $30 per visit, cash only  [facility] Adult Dental Access PROGRAM  [address], [facility] [phone] Patients are seen by appointment only. Walk-ins are not accepted. [place] will see patients 51 years of age and older. One Wednesday Evening (Monthly: Volunteer Based).  $30 per visit, cash only  [place]  [phone] for adults; Children under age 69, call Graduate Pediatric Dentistry at [phone]. Children aged 90-14, please call [phone] to request a pediatric application.  Dental  services are provided in all areas of dental care including fillings, crowns and bridges, complete and partial dentures, implants, gum treatment, root canals, and extractions. Preventive care is also provided. Treatment is provided to both adults and children. Patients are selected via a lottery and there is often a waiting list.   [facility] [address], [place]  [phone] [email or web address]   [facility] [address] [place], [place] [phone], Ext. [phone] Second and Fourth Thursday of each month, opens at 6:30 AM; Clinic ends at 9 AM.  Patients are seen on a first-come first-served basis, and a limited number are seen during each clinic.   [facility]  [address] Hillard Danker [place], [place] [phone]   Eligibility Requirements You must have lived in [place], [place], or [place] counties for at least the last three months.   You cannot be eligible for state or federal sponsored [employer], including [employer], [place], or [employer].   You generally cannot be eligible for healthcare insurance through your employer.    How to apply: Eligibility screenings are held every Tuesday and Wednesday afternoon from 1:00 pm until 4:00 pm. You do not need an appointment for the interview!  [facility] [address] [place], [place]   [place]  [place] Department  [place]  [phone]    Behavioral Health Resources in the Community: Intensive Outpatient Programs Organization         Address  Phone  Notes  [place]. [address] [place], [place] [phone]   [facility] Outpatient [address] [place], [place]   ADS: Alcohol & Drug Svcs [address], [place], [place]   [place] [address]    [place], [place] or [phone]   Substance Abuse Resources Organization         Address  Phone  Notes  Alcohol and Drug Services  [phone]   [place]  [phone]   [facility]   Chinita Pester  [phone]   Residential & Outpatient Substance Abuse Program  [phone]   Psychological Services Organization         Address  Phone  Notes  [place]  [place]  336-  [place] [address] [place] or [phone]    Mobile Crisis Teams Organization         Address  Phone  Notes  Therapeutic Alternatives, Mobile Crisis Care Unit  [phone]   Assertive Psychotherapeutic Services  [address] [place], [place]   [place] Levels [address] [place] [phone]    Self-Help/Support Groups Organization         Address  Phone             Notes  [place]. of [place] - variety of support groups  [place] Call for more information  Narcotics Anonymous (NA), Caring Services [address], [employer] [place]  2 meetings at this location   Special educational needs teacher         Address  Phone  Notes  ASAP Residential Treatment [place],    [place]  [phone]   [facility]  [address] [place] [address], [place], [place]   [place], [place] [phone] Admissions: 8am-3pm M-F  Incentives Substance [place] [address]    [place], [place] [phone]   [facility] [address] [place], [place], [place]   [facility]The [address],  [place], [place]   [facility]ight Programs - Intensive Outpatient [address] Kristeen Mans [address], [place], [place]   [facility] ([address] [place].,  [place], [place]  [phone] or [phone]   Residential Treatment Services (RTS) [address], [place], [place] Accepts Medicaid  [facility]ellowship Kramer [address],  [place] [phone] Substance Abuse/Addiction Treatment   [facility] Organization         Address  Phone  Notes  [facility]  [phone]   Domenic Schwab, PhD [address] [place], [place]   [phone] or [phone]   [place], [place] [phone]   [facility] [address] [place], [place] [phone] Insurance/Medicaid/sponsorship through [facility] and Families [address]                                    [place], [place] [phone] [place] [address] [place], [place] [phone]    Dr. Adele Schilder  [phone]   [facility] Dept. 1) [address], [place] 2) [place] 3)  [address], [place] [phone]  [phone]   [place] [phone] or [phone] (After Hours)

## 2014-05-21 NOTE — Progress Notes (Signed)
Nursing tech was ambulating patient for O2 sats when the patient's HR went into the 200s. Pt had only walked from bed to bedside commode (5 feet). Once patient sat on bedside commode her HR came back down into low 100s. Pt denied experiencing any chest pain or dizziness but did feel short of breath. Pt was on 2L [place], which is what she is on at home. MD paged. Will continue to monitor

## 2014-05-21 NOTE — Progress Notes (Signed)
Patient seen and examined. Case d/w residents in detail. I agree with findings and plan as documented in Dr. Ferd Glassing note.  Patient with episode of severe tachycardia on ambulation today. Will hold off d/c for now and monitor on tele. C/w diltiazem for afib with rvr. HR currently 100s. Patient is orthostatic by BP. Would consider holding lasix and giving gentle hydration. CTA is negative for PE  Patient has completed 7 day course of abx for PNA. Will monitor for now.

## 2014-05-21 NOTE — Progress Notes (Signed)
        Subjective:   She is lying in bed this AM talkative but still c/o of cough.     Objective:   Filed Vitals:   05/21/14 0534  BP: 112/64  Pulse: 104  Temp: 98.1 F (36.7 C)  Resp: 20   Physical Exam: Constitutional: Vital signs reviewed. Patient is sitting up in bed in NAD.    HEENT: [place]/AT; EOMI, conjunctivae normal, no scleral icterus  Cardiovascular: irregularly irregular rhythm, no MRG Pulmonary/Chest: mild basilar crackles and mild wheezing  Abdominal: Soft. +BS, NT/ND Neurological: A&O x3, CN II-XII grossly intact; non-focal exam Extremities: 2+DP b/l, no b/l pitting edema Skin: Warm, dry. Multiple excoriations.    Assessment/Plan:   Principal Problem:  Influenza due to influenza A virus Active Problems:  HLD (hyperlipidemia)  Atrial fibrillation with rapid ventricular response  COPD (chronic obstructive pulmonary disease)  Osteoarthritis  Long term current use of opiate analgesic   Acute on chronic hypercarbic respiratory failure- Likely d/t AECOPD and viral PNA vs. bacterial.  She is satting 98% on 2 L [place] which is her home oxygen requirement.  Completed vanc/cefepime and azithromycin for a total of 7 days.  Breathing is back to baseline.  Still c/o cough.  -cont duonebs -cont home dose of lasix -cont prednisone  -keep sats 88-95%   -ambulate with pulse ox  -cont 2L [place]  -likely d/c to SNF today  -f/u with [facility]   Anxiety- Pt expressed some increased anxiety but without SI or HI.   -consulted psychiatry to see Ms. Cocker who recommends following up with outpatient [facility].   Influenza A- Resolved.  Pt influenza A positive.  Completed 5 day course of tamiflu.   -cont symptomatic tx  Hemoptysis- Improved.  Hgb stable.  COPD- -continue current meds -plans per above   Afib with RVR- HR controlled.  Likely exacerbated by influenza, ?PNA, albuterol, and continued lasix.   Refuses anticoagulation.   -cont cardizem  Right  shoulder pain- -cont vicodin  -kpad  Disposition Anticipated discharge today to SNF.     Jones Bales, MD PGY-2, Internal Medicine Teaching Service 11:12 AM

## 2014-05-21 NOTE — Discharge Summary (Signed)
Name: Kelsey Garcia MRN: [id number] DOB: May 20, 1945 69 y.o. PCP: No Pcp Per Patient ______________________________________________________________  Date of Admission: 05/10/2014  7:34 PM Date of Discharge: 05/22/2014 Attending Physician: [name], MD  Discharge Diagnosis: Principal Problem:   Acute on chronic respiratory failure  Active Problems:   HLD (hyperlipidemia)   Atrial fibrillation with rapid ventricular response   COPD (chronic obstructive pulmonary disease)   Osteoarthritis   OCD/Anxiety   Long term current use of opiate analgesic   Chronic diastolic CHF (congestive heart failure)   HCAP (healthcare-associated pneumonia)   Influenza due to influenza A virus   Pancytopenia   Hemoptysis   Mediastinal and hilar lymphadenopathy on CT   Discharge Medications:   Medication List    STOP taking these medications        furosemide 40 MG tablet  Commonly known as:  LASIX     levofloxacin 500 MG tablet  Commonly known as:  LEVAQUIN     potassium chloride SA 20 MEQ tablet  Commonly known as:  K-DUR,KLOR-CON     predniSONE 20 MG tablet  Commonly known as:  DELTASONE      TAKE these medications        albuterol 108 (90 BASE) MCG/ACT inhaler  Commonly known as:  PROVENTIL HFA;VENTOLIN HFA  Inhale 1-2 puffs into the lungs every 6 (six) hours as needed for wheezing.     aspirin 325 MG tablet  Take 650 mg by mouth daily.     budesonide-formoterol 160-4.5 MCG/ACT inhaler  Commonly known as:  SYMBICORT  Inhale 2 puffs into the lungs 2 (two) times daily.     busPIRone 10 MG tablet  Commonly known as:  BUSPAR  Take 10 mg by mouth 3 (three) times daily.     chlorpheniramine 4 MG tablet  Commonly known as:  CHLOR-TRIMETON  Take 1 tablet (4 mg total) by mouth every 8 (eight) hours as needed for allergies.     diazepam 2 MG tablet  Commonly known as:  VALIUM  Take 1 tablet (2 mg total) by mouth every 12 (twelve) hours as needed for anxiety.     diltiazem 420 MG 24 hr capsule  Commonly known as:  TIAZAC  Take 1 capsule (420 mg total) by mouth daily.     FLUoxetine 20 MG capsule  Commonly known as:  PROZAC  TAKE THREE CAPSULES BY MOUTH ONCE DAILY     guaiFENesin 600 MG 12 hr tablet  Commonly known as:  MUCINEX  Take 1 tablet (600 mg total) by mouth 2 (two) times daily.     HYDROcodone-acetaminophen 7.5-325 MG per tablet  Commonly known as:  NORCO  Take 1 tablet by mouth every 6 (six) hours as needed.     HYDROcodone-homatropine 5-1.5 MG/5ML syrup  Commonly known as:  HYCODAN  Take 5 mLs by mouth every 6 (six) hours as needed for cough.     nystatin 100000 UNIT/ML suspension  Commonly known as:  MYCOSTATIN  Take 5 mLs (500,000 Units total) by mouth as needed.     pantoprazole 40 MG tablet  Commonly known as:  PROTONIX  TAKE ONE TABLET BY MOUTH TWICE DAILY     tiotropium 18 MCG inhalation capsule  Commonly known as:  SPIRIVA  Place 1 capsule (18 mcg total) into inhaler and inhale daily.     zolpidem 5 MG tablet  Commonly known as:  AMBIEN  Take 1 tablet (5 mg total) by mouth at bedtime as needed for sleep.  Disposition and follow-up:   KelseyKelsey Garcia was discharged from [facility] in good condition to SNF.  Please address the following problems post-discharge:  1.Anxiety, OCD symptoms 2.Resolution of cough-->continued use of home 2L O2 and compliance with COPD meds.   3.She really needs anticoagulation with atrial fibrillation but refuses.  Please discuss this with her.   4. We discontinued her lasix upon discharge because she was orthostatic.  Please address whether she needs to continue this.    Labs / imaging needed at time of follow-up: BMP (willl need to have potassium checked tomorrow), cbc, repeat CXR in 6 weeks, chest CT in 3 months for follow up of hilar/mediastinal adenopathy   Pending labs/ test needing follow-up: N/A  Follow-up Appointments: Follow-up Information     Follow up with No PCP Per Patient.   Specialty:  General Practice      Follow up with [facility].   Specialty:  Emergency Medicine   Why:  As needed   Contact information:   [address] [id number] [place] [phone]      Follow up with [facility].   Why:  Call this number to get a new Primary Care Physician.  You will need a Medical Doctor's (your new Primary care physician's) referral to the pain clinic.   Contact information:   [phone]      Follow up with Jenkins Rouge, MD. Go on 06/07/2014.   Specialty:  Cardiology   Why:  06/07/14 at [place] information:   [address] [address] [phone]       Follow up with [facility], NP. Go on 06/04/2014.   Specialty:  Nurse Practitioner   Why:  06/04/14 at 10:30 AM   Contact information:   [address] [address] [phone]       Discharge Instructions: Discharge Instructions    (Barber) Call MD:  Anytime you have any of the following symptoms: 1) 3 pound weight gain in 24 hours or 5 pounds in 1 week 2) shortness of breath, with or without a dry hacking cough 3) swelling in the hands, feet or stomach 4) if you have to sleep on extra pillows at night in order to breathe.    Complete by:  As directed      (HEART FAILURE PATIENTS) Call MD:  Anytime you have any of the following symptoms: 1) 3 pound weight gain in 24 hours or 5 pounds in 1 week 2) shortness of breath, with or without a dry hacking cough 3) swelling in the hands, feet or stomach 4) if you have to sleep on extra pillows at night in order to breathe.    Complete by:  As directed      Call MD for:  difficulty breathing, headache or visual disturbances    Complete by:  As directed      Call MD for:  extreme fatigue    Complete by:  As directed      Call MD for:  extreme fatigue    Complete by:  As directed       Call MD for:  persistant dizziness or light-headedness    Complete by:  As directed      Call MD for:  persistant dizziness or light-headedness    Complete by:  As directed      Diet - low sodium heart healthy  Complete by:  As directed      Diet - low sodium heart healthy    Complete by:  As directed      Discharge instructions    Complete by:  As directed   She will need to take one more day of prednisone on 4/12.  She will need to continue using her home O2 of 2L [place].     Discharge instructions    Complete by:  As directed   Please hold your lasix until you discuss this with your physician.     Increase activity slowly    Complete by:  As directed      Increase activity slowly    Complete by:  As directed            Consultations:  Pulmonology   Procedures Performed:  Dg Chest 2 View  05/19/2014   CLINICAL DATA:  Shortness of breath.  EXAM: CHEST  2 VIEW  COMPARISON:  05/17/2014 and 05/16/2014  FINDINGS: Lordotic technique is demonstrated. Lungs are adequately inflated with stable elevation of right hemidiaphragm. There is significant overall improvement in patient's previously noted multifocal bilateral airspace process now with mild residual opacification over the right upper lobe. No evidence effusion. Cardiomediastinal silhouette and remainder of the exam is unchanged.  IMPRESSION: Significant overall improvement patient's multifocal bilateral airspace process with mild residual opacification over the right upper lobe compatible with resolving pneumonia.   Electronically Signed   By: Marin Olp M.D.   On: 05/19/2014 15:04   Dg Chest 2 View  05/11/2014   CLINICAL DATA:  Community-acquired pneumonia, healthcare acquired pneumonia. Fever.  EXAM: CHEST  2 VIEW  COMPARISON:  Portable AP view 1 day prior at 1958 hr  FINDINGS: Minimal patchy right suprahilar opacity, unchanged from recent exam. No new airspace opacity. There diffusely prominent bronchovascular markings, with improved  lung aeration, can be seen with bronchial thickening. The cardiomediastinal contours are unchanged, heart at the upper limits of normal in size. No pleural effusion or pneumothorax. No acute osseous abnormalities are seen.  IMPRESSION: Minimal patchy right suprahilar opacity concerning for pneumonia, unchanged from prior exam. Prominence of bronchovascular markings, persist after improved lung aeration suggesting bronchial thickening.   Electronically Signed   By: Jeb Levering M.D.   On: 05/11/2014 02:17   Dg Chest 2 View  04/28/2014   CLINICAL DATA:  Worsening dyspnea  EXAM: CHEST  2 VIEW  COMPARISON:  11/29/2013  FINDINGS: The heart size and mediastinal contours are within normal limits. Both lungs are clear. The visualized skeletal structures are unremarkable.  IMPRESSION: No active cardiopulmonary disease.   Electronically Signed   By: Andreas Newport M.D.   On: 04/28/2014 21:08   Ct Chest W Contrast  05/13/2014   CLINICAL DATA:  Hemoptysis. Cough. Right upper lobe consolidation on chest radiograph. COPD. Congestive heart failure.  EXAM: CT CHEST WITH CONTRAST  TECHNIQUE: Multidetector CT imaging of the chest was performed during intravenous contrast administration.  CONTRAST:  174mL OMNIPAQUE IOHEXOL 300 MG/ML  SOLN  COMPARISON:  Chest radiograph on 05/13/2014 and previous chest CT on 09/25/2010  FINDINGS: Mediastinum/Lymph Nodes: Mild lymphadenopathy is seen in the mediastinum and bilateral hilar regions, with largest index lymph node in the right paratracheal region measuring 14 mm on image 19/16 2. This is nonspecific but likely reactive in etiology.  Lungs/Pleura: Mild emphysema is noted. Pulmonary airspace disease is seen with predominant involvement of the right upper lobe, but also lesser involvement of the lower lobes, and  minimal involvement of the right middle lobe and lingula. This is suspicious for pneumonia with asymmetric edema considered less likely. No evidence of pleural effusion   Musculoskeletal/Soft Tissues: No suspicious bone lesions or other significant chest wall abnormality. Incompletely healed but benign-appearing fracture of the left lateral sixth rib is seen, which demonstrates some callus formation.  Upper Abdomen: Multiple calcified gallstones noted without evidence of cholecystitis.  IMPRESSION: Bilateral multilobar airspace disease, with greatest involvement of the right upper lobe, suspicious for pneumonia.  Shotty mediastinal and bilateral hilar lymphadenopathy, likely reactive in etiology. Continued followup by chest CT with contrast recommended in 3 months.  No evidence of pleural effusion.  Incompletely healed left lateral sixth rib fracture incidentally noted.   Electronically Signed   By: Earle Gell M.D.   On: 05/13/2014 13:06   Ct Angio Chest Pe W/cm &/or Wo Cm  05/20/2014   CLINICAL DATA:  Shortness of breath.  EXAM: CT ANGIOGRAPHY CHEST WITH CONTRAST  TECHNIQUE: Multidetector CT imaging of the chest was performed using the standard protocol during bolus administration of intravenous contrast. Multiplanar CT image reconstructions and MIPs were obtained to evaluate the vascular anatomy.  CONTRAST:  138mL OMNIPAQUE IOHEXOL 350 MG/ML SOLN  COMPARISON:  05/13/2014  FINDINGS: Technically adequate study with good opacification of the central and segmental pulmonary arteries. No focal filling defects demonstrated. No evidence of significant pulmonary embolus.  Normal heart size. Normal caliber thoracic aorta. No evidence of aortic dissection. Great vessel origins are patent. Prominent lymph nodes in the mediastinum, slightly decreased since previous study. Subcarinal lymph nodes demonstrate calcification consistent with old granulomatous infection. Esophagus is decompressed.  Evaluation of lungs is limited due to motion artifact. Again demonstrated is prominent nodular airspace infiltration throughout the right lung and in the left lower lung. Infiltration is most prominent  in the right upper lung. There is improvement in the left lung base since previous study. Findings consistent with multifocal pneumonia. Followup in 3 months is suggested to exclude underlying or residual lesion. No pneumothorax. Airways appear patent. No pleural effusion.  Included portions of the upper abdominal organs demonstrate cholelithiasis. Small esophageal hiatal hernia. Mild degenerative changes in the spine with mild anterior compression of the superior endplate of T3. No change since previous study. Old left rib fracture.  Review of the MIP images confirms the above findings.  IMPRESSION: Persistent diffuse parenchymal nodular and airspace infiltration bilaterally with mild improvement since prior study. No evidence of significant pulmonary embolus.   Electronically Signed   By: Lucienne Capers M.D.   On: 05/20/2014 01:24   Dg Chest Port 1 View  05/17/2014   CLINICAL DATA:  Community-acquired pneumonia.  EXAM: PORTABLE CHEST - 1 VIEW  COMPARISON:  05/16/2014.  FINDINGS: Mediastinal structures are normal. Stable cardiomegaly. Persistent diffuse bilateral pulmonary infiltrates are again noted. Particularly prominent throughout the right lung. Small left pleural effusion cannot be excluded. No pneumothorax.  IMPRESSION: 1. Persistent diffuse bilateral pulmonary infiltrates, particularly prominent in the right lung. Small left effusion cannot be excluded.  2. Persistent cardiomegaly.  No pulmonary venous congestion.   Electronically Signed   By: Marcello Moores  Register   On: 05/17/2014 07:31   Dg Chest Port 1 View  05/16/2014   CLINICAL DATA:  Pneumonia.  Shortness of breath.  EXAM: PORTABLE CHEST - 1 VIEW  COMPARISON:  05/15/2014 .  FINDINGS: Mediastinum hilar structures are stable. Persistent severe bilateral pulmonary airspace disease noted. Changes are most prominent throughout the right lung. Persistent cardiomegaly. Small left pleural  effusion cannot be excluded. No pneumothorax. No acute osseus  abnormality .  IMPRESSION: 1. Persistent severe bilateral pulmonary infiltrates, particular prominent throughout the right lung. Findings consistent with bilateral pneumonia and/or pulmonary edema. No interim change. Small left pleural effusion cannot be excluded. 2. Persistent cardiomegaly.   Electronically Signed   By: Marcello Moores  Register   On: 05/16/2014 07:20   Dg Chest Port 1 View  05/15/2014   CLINICAL DATA:  Acute respiratory failure.  EXAM: PORTABLE CHEST - 1 VIEW  COMPARISON:  May 13, 2014.  FINDINGS: Stable cardiomediastinal silhouette. No pneumothorax or significant pleural effusion is noted. Increased mixed interstitial and airspace opacities are noted throughout both lungs, particularly in the right lung. This is consistent with worsening pneumonia or possibly edema.  IMPRESSION: Increased bilateral lung opacities are noted, with right worse than left. This is concerning for worsening pneumonia or edema. Followup radiographs are recommended.   Electronically Signed   By: Marijo Conception, M.D.   On: 05/15/2014 09:05   Dg Chest Port 1 View  05/13/2014   CLINICAL DATA:  Increased oxygen demand  EXAM: PORTABLE CHEST - 1 VIEW  COMPARISON:  05/11/2014  FINDINGS: There is substantially increased airspace consolidation in the right upper lobe consistent with pneumonia although hemorrhage or aspiration could also produce this appearance. Left lung is clear. No effusion is evident. Heart size is unchanged. Pulmonary vasculature is normal.  IMPRESSION: Development of confluent consolidation in the right upper lobe, likely pneumonia.   Electronically Signed   By: Andreas Newport M.D.   On: 05/13/2014 01:47   Dg Chest Port 1 View  05/10/2014   CLINICAL DATA:  Cough and shortness of breath for 2-3 weeks.  EXAM: PORTABLE CHEST - 1 VIEW  COMPARISON:  Frontal and lateral views 04/28/14  FINDINGS: Lower lung volumes leading to crowding of bronchovascular structures. There is elevation of right hemidiaphragm.  Question of developing ill-defined right suprahilar opacity. The cardiomediastinal contours are unchanged allowing for differences in technique, heart at the upper limits of normal. No large pleural effusion or pneumothorax. No acute osseous abnormalities are seen.  IMPRESSION: Lower lung volumes from prior exam, with question of developing ill-defined suprahilar opacity, may reflect pneumonia in the appropriate clinical setting.   Electronically Signed   By: Jeb Levering M.D.   On: 05/10/2014 20:18    2D Echo: N/A  Cardiac Cath: N/A  Admission HPI:  Kelsey Garcia is a 69 year old female with GERD, OCD, osteoarthritis, hypertension, hyperlipidemia, history of diastolic CHF, atrial fibrillation, COPD GOLD Stage III who presented with shortness of breath.  She was seen in the emergency department on 3/19 for dyspnea thought to be secondary to acute COPD exacerbation and given continuous albuterol with Solu-Medrol 125 mg IV and discharged on prednisone 20 mg 10 tablets, Levaquin 500 mg 10 tablets, Mucinex, albuterol inhaler. She reports today not feeling better since then despite completing her course of prednisone and antibiotic. She went to her office visit yesterday and was told to continue her current medications, Symbicort 2 puffs twice daily, Spiriva once daily, albuterol every 4-6 hours as needed for her wheezing.. Today, she felt like things got worse to the point where she "couldn't breathe." She called EMS who brought her to the emergency department for further evaluation.   Her symptoms include wheezing, cough productive of white/yellow sputum, fever, chills, sore throat. At baseline, she does wear 2 L oxygen and reports increasing this to 3 L yesterday when she was not having any  improvement in her symptoms. She also reports a history of allergies mostly to pollen, dust, cats though also has 2 pet cats at home. Aside from her COPD medications, she also takes diltiazem 420 mg daily for her  atrial fibrillation though ran out of her medication yesterday and did not have any money to refill it. She smokes one pack per day has done so since the age of 23 but denies any recent alcohol or illicit drug use; however, she reports using cocaine, marijuana back in the late 72s and 20s. Otherwise, she denies any sick contacts, dizziness, poor appetite, abdominal pain, vomiting, diarrhea, leg swelling.   In the emergency department, she received 500 mL normal saline bolus, DuoNeb's 1, Solu-Medrol 125 mg IV, doxycycline 100 mg, ceftriaxone 1 g IV. She was also found to have A. fib with RVR and given diltiazem 10 mg injection.  Original H&P, Charlott Rakes, MD  Hospital Course by problem list: Principal Problem:   Acute on chronic respiratory failure Active Problems:   HLD (hyperlipidemia)   Atrial fibrillation with rapid ventricular response   COPD (chronic obstructive pulmonary disease)   Osteoarthritis   Anxiety   Long term current use of opiate analgesic   Chronic diastolic CHF (congestive heart failure)   HCAP (healthcare-associated pneumonia)   Influenza due to influenza A virus   Pancytopenia   Hemoptysis   Mediastinal and hilar lymphadenopathy on CT   Acute respiratory failure   Kelsey Garcia is a 69 yo woman with a PMH significant for GOLD stage III COPD, diastolic  heart failure with preserved EF, hypertension, hyperlipidemia, GERD, OCD, osteoarthritis, and atrial fibrillation who was admitted to [facility] for management of COPD exacerbation with pneumonia complicated by atrial fibrillation with RVR.   Acute on chronic hypercarbic respiratory failure:  On 3/19, Kelsey Garcia was seen in the ED for dyspnea thought to be 2/2 COPD exacerbation and was treated with continuous albuterol, IV solumedrol and discharged with prednisone, levofloxacin, mucinex, and albuterol inhaler. She was seen in the internal medicine clinic on 3/30, the day prior to admission with no improvement in her  symptoms and was continued on her home COPD regimen. She presented to the ED on the day of admission by EMS for worsening dyspnea, productive cough, fever, chills, and sore throat. She was found to be influenza positive and was treated with 5 days of osteltamivir. She developed significant hypoxia during this admission, requiring partial non-rebreather to maintain saturations and was additionally treated with a 7 day course of vancomycin, cefepime, and azithromycin for possible HCAP. In addition, she was treated with steroids and diuresis with furosemide. With this treatment, she was weaned to her home baseline of 2 L of oxygen by nasal cannula. She continued to endorse episodes of feeling like she couldn't breath, sometimes associated with chest pain, but was able to maintain oxygen saturations. She had a negative troponin and CTA was negative for pulmonary embolism. Her continued SOB despite improvement in exam, CXR, and saturations was attributed to anxiety.  She should continue her home oxygen requirement of 2L [place] upon discharge.    Hyperkalemia/hypokalemia: She had been receiving potassium supplementation during admission due to also being on lasix.  However, upon day of discharge potassium was 5.3 and will need close follow up with potassium checked tomorrow.   Atrial fibrillation with RVR: On admission, pt was found to have atrial fibrillation with RVR. This was initially managed with diltiazem and heparin gtt. She was transitioned to her home  oral diltiazem with improvement in her tachycardia, but was consistently in atrial fibrillation. Her heparin gtt was stopped in the setting of declining hemoglobin and possible hemoptysis. Despite a CHADS2-VASC score of 4, the patient declines chronic anticoagulation for her atrial fibrillation. TSH was low, but free T4 was within normal limits.  She will continue her home dose of diltiazem.    History of HFpEF, HTN: Kelsey Garcia has a history of HFpEF with EF of  50-55% on most recent ECHO in January of 2016. She has a history of symptoms of dyspnea with associated crackles, lower extremity edema and elevated BNP. Her BNP this admission was 145-195.5. She did show improvement in crackles, dyspnea, and diffuse infiltrates on CXR consistent with pulmonary edema with treatment with furosemide. Because this improvement coincided with antibiotic and steroid treatment, it is unclear what role volume overload 2/2 diastolic dysfunction may have played in her presentation.  She has follow up with cardiology.  She should continue on her home dose of furosemide 80mg  twice daily.    Anxiety, PTSD, Depression, OCD: Throughout this admission, pt complained of anxiety which was associated with increased respiratory rate and may have contributed to her symptoms of dyspnea. She endorsed chronic anxiety, depression, and chronic suicidal ideation. She was seen by psychiatry, who recommended outpatient follow up with [facility] and continue current meds.  She was managed carefully with a xanax PRN due to concerns regarding her respiratory status.  She will be discharged on her home regimen of buspar, valium, zolpidem.    Anemia, thrombocytopenia: Her hemoglobin and platelets down-trended during her admission, possibly in association with heparinization and hemoptysis. A smear was flagged for atypical lymphocytes. This improved prior to discharge with cessation of heparin and improvement in hemoptysis. If she has persistent hematologic abnormalities outside the setting of acute infection, she may need follow-up with hematology/oncology.   Pain Management: She has complex pain management needs. She was recently discharged from the [facility] on 3/30 for becoming verbally abusive with staff when discussing opioid pain medications. She is amenable to psychiatric outpatient follow-up.  We will not prescribe her additional pain meds upon discharge.    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 She will need follow-up.   Hyperlipidemia: Elevated cholesterol in September of 2015 notable for total cholesterol 194, HDL 52, LDL 127, and triglycerides 76. She will need outpatient follow-up for possible initiation of statin therapy.   Orthostatic hypotension: She was orthostatic prior to discharge and was given IVF and lasix was held.  Please address whether she needs to continue lasix.  We did not restart this upon discharge.  Potassium was also a bit elevated upon discharge at 5.3 and will need to be repeated tomorrow.    Follow-up:  Resources for establishing a PCP with [facility] as well as for contacting [name] with pulmonology and [name] with cardiology is provided in her discharge summary.   Discharge Vitals:   BP 101/54 mmHg  Pulse 94  Temp(Src) 98.4 F (36.9 C) (Oral)  Resp 18  Ht 5\' 5"  (1.651 m)  Wt 182 lb 12.8 oz (82.918 kg)  BMI 30.42 kg/m2  SpO2 97%  Discharge Labs:  Results for orders placed or performed during the hospital encounter of 05/10/14 (from the past 24 hour(s))  Basic metabolic panel     Status: Abnormal   Collection Time: 05/22/14  8:40 AM  Result Value Ref Range  Sodium 141 135 - 145 mmol/L   Potassium 5.3 (H) 3.5 - 5.1 mmol/L   Chloride 99 96 - 112 mmol/L   CO2 35 (H) 19 - 32 mmol/L   Glucose, Bld 111 (H) 70 - 99 mg/dL   BUN 17 6 - 23 mg/dL   Creatinine, Ser 0.72 0.50 - 1.10 mg/dL   Calcium 8.9 8.4 - 10.5 mg/dL   GFR calc non Af Amer 87 (L) >90 mL/min   GFR calc Af Amer >90 >90 mL/min   Anion gap 7 5 - 15    Signed: Jones Bales, MD 05/22/2014, 1:38 PM   Services Ordered on Discharge: SNF Equipment Ordered on Discharge: None

## 2014-05-21 NOTE — Progress Notes (Signed)
Addendum:  Significant Event Update, 2:45 PM:   Per nursing, when ambulating with the patient, the patient's heart rate increased into the 200s. No blood pressure reading available from that time.   When speaking with the patient, she endorses a feeling of dizziness and SOB when she was ambulating, "like all the air goes out of me". She states that her oxygen saturation dropped to 74% at that time. She says she could feel her heart racing, but denies palpitations or chest pain. She describes the episode as similar to a hot flash. She now says that she is terrified and is worried about dropping dead when she gets up. She does endorse significant anxiety.   With nursing, we again attempted to ambulate with the patient. At rest, she was satting 97-100% on her home 2L of O2 by nasal cannula and her heart rate was 102. Just prior to getting up from bed, the patient's heart rate oscillated between 103-114. After rising, her heart rate was 106, oxygen saturation was 95%. She complained of dizziness and being SOB, like she felt before. After sitting, her heart rate continued to oscillate from the low 100s up to 120s with oxygen saturation of 93%. BP after sitting was 96/63.   The patient states that at home, she usually does not have to increase her home O2 when walking. She does state that about 1/2 the times that she goes up a flight of stairs, she will get a feeling of a 1 second lasting "knock" to her chest and a feeling of "dizziness" in her hip. This chest knock has happened a few times the hospital, usually when she is up and more active, but she states that it will only last about 1s. She says that when she takes her pain medication, norco, she has less SOB and sensation of heart racing.    Subjective: NAEON. This morning she reports that her breathing and cough are getting better. She ate breakfast this AM with no nausea, vomiting, or abdominal pain.   Objective: Vital signs in last 24  hours: Filed Vitals:   05/20/14 1357 05/20/14 2033 05/20/14 2144 05/21/14 0534  BP:   100/67 112/64  Pulse:   99 104  Temp:   98.2 F (36.8 C) 98.1 F (36.7 C)  TempSrc:   Oral Oral  Resp:   20 20  Height:      Weight:    82.781 kg (182 lb 8 oz)  SpO2: 98% 98% 98% 93%   Weight change: 0.382 kg (13.5 oz)  Intake/Output Summary (Last 24 hours) at 05/21/14 1019 Last data filed at 05/21/14 0814  Gross per 24 hour  Intake    960 ml  Output    250 ml  Net    710 ml   General: Lying down in bed in no acute distress.  CV: Irregular rhythm Pulm: Diffuse wheezes more prominent over upper lung fields. Normal work of breathing. Able to speak in full sentences  Lab Results: Basic Metabolic Panel:  Recent Labs  05/19/14 0352 05/20/14 0655  NA 139 140  K 3.8 4.1  CL 94* 95*  CO2 34* 32  GLUCOSE 143* 92  BUN 13 12  CREATININE 0.68 0.64  CALCIUM 9.0 8.8   CBC:  Recent Labs  05/20/14 0655  WBC 10.3  HGB 13.0  HCT 41.1  MCV 97.4  PLT 268   Cardiac Enzymes:  Recent Labs  05/19/14 1525  TROPONINI <0.03   BNP: No results for input(s): PROBNP  in the last 72 hours. D-Dimer:  Recent Labs  05/19/14 1525  DDIMER 2.52*   CBG:  Recent Labs  05/18/14 1203 05/18/14 1646  GLUCAP 149* 236*   Micro Results: Reviewed in EPIC. Please see chart for full details.   Medications:  Scheduled Meds: . antiseptic oral rinse  7 mL Mouth Rinse BID  . busPIRone  10 mg Oral TID  . diltiazem  420 mg Oral Daily  . FLUoxetine  60 mg Oral Daily  . furosemide  80 mg Oral BID  . guaiFENesin  600 mg Oral BID  . ipratropium-albuterol  3 mL Nebulization TID  . pantoprazole  40 mg Oral Daily  . potassium chloride  40 mEq Oral BID  . predniSONE  40 mg Oral Q breakfast   Continuous Infusions:  PRN Meds:.albuterol, ALPRAZolam, HYDROcodone-acetaminophen, HYDROcodone-homatropine, white petrolatum   Assessment/Plan:Mrs Leblond is a 69 yo female with a PMH significant for gold stage III  COPD, atrial fibrillation, possible HFpEF, OCD, GERD, osteoarthritis, HTN and hyperlipidemia who has now completed treatment for influenza, possible HCAP, and atrial fibrillation with RVR.  Principal Problem:   Acute on chronic respiratory failure Active Problems:   HLD (hyperlipidemia)   Atrial fibrillation with rapid ventricular response   COPD (chronic obstructive pulmonary disease)   Osteoarthritis   Anxiety   Long term current use of opiate analgesic   Chronic diastolic CHF (congestive heart failure)   HCAP (healthcare-associated pneumonia)   Influenza due to influenza A virus   Pancytopenia   Hemoptysis   Mediastinal and hilar lymphadenopathy on CT  ** Possible HCAP, influenza, possible volume overload: Now s/p 5 day course of oseltamivir for influenza and a 7 day course of vancomycin/cefepime/azithromycin for possible HCAP. Slowly weaned from partial NRB with significant improvement in oxygenation with antibiotics, steroids, and diuresis. It is unclear if her underlying etiology was a viral pneumonia with subsequent ARDS type reaction vs HCAP. Now weaned to home oxygen requirement on nasal cannula. Influenza A positive, HIV antibody negative, legionella urinary antigen negative, Strep pneumoniae urinary antigen negative. Blood cultures from 3/31 and from 4/3 with no growth for 5 days.  -- Now s/p full 7 days of vancomycin, cefepime, and azithromycin.  -- On home oral lasix 80 mg BID -- Continue prednisone 40 mg daily (day 6 of steroids) -- Will need follow-up with [name] from pulmonology.   ** Anemia, thrombocytopenia: Hemoglobin down-trended over the course of admission in the setting of hemoptysis and bleeding from hemorrhoids.Heparin gtt started for atrial fibrillation with RVR was discontinued on 4/2. Platelets also down-trended. Both anemia and thrombocytopenia have improved now, associated with normalizing white count. Unclear if this is due to concentration/diluation.   -- If continues outside the setting of acute infection, may need follow-up as an outpatient  ** Atrial fibrillation with RVR: Tachycardic to 140s on admission in the setting of missing home diltiazem. S/p diltiazem drip, now on home po dose. Rate improved now. TSH mildly low at 0.293. T4 WNL at 1.04.  -- Home diltiazem (tiazac 24 hr capsule) 420 mg daily -- Defer anticoagulation due to patient preference -- Hemoglobin A1c 5.7, sightly elevated with no history of diabetes, will likely need follow- up as an outpatient  ** COPD: GOLD stage III.  -- Continue DuoNebs Q6 hours -- Antiseptic oral rinse with steroid inhalers  ** HFpEF: Unclear history of CHF.  -- Continue Aspirin 81 mg -- Lasix as above  ** Hypertension: Appears well controlled.  -- Continue home dilltiazem  **  Hyperlipidemia: Lipid panel 10/12/2013 notable for total cholesterol 194, HDL 52, LDL 127, triglycerides 76. She is not on any statin therapy. -- Defer starting statin therapy as outpatient  ** Osteoarthritis: Complex pain management with previously being discharged from our clinic for becoming verbally abusive when discussing pain medications.  -- On norco, slightly lower dose than home dose -- Provide her with resources to establish PCP care elsewhere  ** OCD, anxiety, chronic suicidality: She follows with a therapist at [facility] and does not see a psychiatrist. 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 She does not endorse an active plan, but has thought about possible methods. -- Psychiatry consult, appreciate reccs -- Continue home buspar, prozac -- Now on xanax 0.25 mg TID for anxiety from home valium -- Will defer any changes to outpatient follow-up  **FEN/GI: -- Diet: Heart diet  ** PPx:  -- DVT: SCDs -- Home pantoprazole  ** Dispo:  -- Full code -- SW consulting for SNF placement -- Discussed with patient the importance of follow-up with  pulmonology, cardiology and establishing a new PCP. Will try to schedule follow-up prior to discharge.  This is a Careers information officer Note.  The care of the patient was discussed with Dr. Gordy Levan and the assessment and plan formulated with their assistance.  Please see their attached note for official documentation of the daily encounter.   LOS: 10 days   Dierdre Forth, Med Student 05/21/2014, 10:19 AM  [phone]

## 2014-05-22 LAB — BASIC METABOLIC PANEL
ANION GAP: 7 (ref 5–15)
BUN: 17 mg/dL (ref 6–23)
CALCIUM: 8.9 mg/dL (ref 8.4–10.5)
CHLORIDE: 99 mmol/L (ref 96–112)
CO2: 35 mmol/L — ABNORMAL HIGH (ref 19–32)
Creatinine, Ser: 0.72 mg/dL (ref 0.50–1.10)
GFR calc Af Amer: >90 mL/min (ref >90)
GFR calc non Af Amer: 87 mL/min — ABNORMAL LOW (ref >90)
GLUCOSE: 111 mg/dL — AB (ref 70–99)
Potassium: 5.3 mmol/L — ABNORMAL HIGH (ref 3.5–5.1)
SODIUM: 141 mmol/L (ref 135–145)

## 2014-05-22 MED ORDER — SODIUM CHLORIDE 0.9 % IV BOLUS (SEPSIS)
250.0000 mL | Freq: Once | INTRAVENOUS | Status: AC
  Administered 2014-05-22: 250 mL via INTRAVENOUS

## 2014-05-22 MED ORDER — POTASSIUM CHLORIDE CRYS ER 20 MEQ PO TBCR: 40.0000 meq | EXTENDED_RELEASE_TABLET | Freq: Two times a day (BID) | ORAL | Status: DC

## 2014-05-22 NOTE — Progress Notes (Signed)
        Subjective:   She is sitting up in bed this AM.  Still coughing.   Objective:   Filed Vitals:   05/22/14 1450  BP: 107/61  Pulse: 91  Temp: 98.2 F (36.8 C)  Resp: 18   Physical Exam: Constitutional: Vital signs reviewed. Patient is sitting up in bed in NAD.    HEENT: [place]/AT; EOMI, conjunctivae normal, no scleral icterus  Cardiovascular: irregularly irregular rhythm, no MRG Pulmonary/Chest: CTAB Abdominal: Soft. +BS, NT/ND Neurological: A&O x3, CN II-XII grossly intact; non-focal exam Extremities: 2+DP b/l, no b/l pitting edema Skin: Warm, dry. Multiple excoriations.    Assessment/Plan:   Principal Problem:  Influenza due to influenza A virus Active Problems:  HLD (hyperlipidemia)  Atrial fibrillation with rapid ventricular response  COPD (chronic obstructive pulmonary disease)  Osteoarthritis  Long term current use of opiate analgesic   Acute on chronic hypercarbic respiratory failure- Likely d/t AECOPD and viral PNA vs. bacterial.  She is satting 98% on 2 L [place] which is her home oxygen requirement.  Completed vanc/cefepime and azithromycin for a total of 7 days.  Breathing is back to baseline.  Still c/o cough.  -cont duonebs -cont prednisone (last dose today) -keep sats 88-95%   -ambulate with pulse ox  -cont 2L [place]  -likely d/c to SNF today  -f/u with [facility]   Orthostatic hypotension- Pt was orthostatic last PM.  Lasix was held and she was given IVF overnight.  She still complains of some dizziness.  We are holding lasix today to be resumed when appropriate by SNF.  Also given a 242ml NS bolus prior to discharge.    Mild hyperkalemia- Potassium 5.3 upon discharge.  She was on potassium supplementation while receiving lasix but has been discontinued.  She will need to have this repeated tomorrow.    Anxiety- Pt expressed some increased anxiety but without SI or HI.   -consulted psychiatry to see Ms. Shareef who recommends following up  with outpatient [facility].   Influenza A- Resolved.  Pt influenza A positive.  Completed 5 day course of tamiflu.   -cont symptomatic tx  Hemoptysis- Improved.  Hgb stable.  COPD- -continue current meds -plans per above   Afib with RVR- HR controlled.  Likely exacerbated by influenza, ?PNA, albuterol, and continued lasix.   Refuses anticoagulation.   -cont cardizem  Right shoulder pain- -cont vicodin  -kpad  Disposition Anticipated discharge today to SNF.     Jones Bales, MD PGY-2, Internal Medicine Teaching Service 4:21 PM

## 2014-05-22 NOTE — Progress Notes (Signed)
PT Cancellation Note  Patient Details Name: Kelsey Garcia MRN: [id number] DOB: 1945/11/08   Cancelled Treatment:    Reason Eval/Treat Not Completed: Patient declined, no reason specified (pt states she is moving to sNF today and declined mobility due to not wanting to be worn out. Pt agreeable to orthostatics with nursing. Will follow at later date)   Melford Aase 05/22/2014, 10:23 AM Elwyn Reach, [place]

## 2014-05-22 NOTE — Clinical Social Work Placement (Signed)
     Clinical Social Work Department CLINICAL SOCIAL WORK PLACEMENT NOTE 05/22/2014  Patient:  Kelsey Garcia, Kelsey Garcia  Account Number:  [id number] Admit date:  05/10/2014  Clinical Social Worker:  Kemper Durie, [place]  Date/time:  05/20/2014 11:00 AM  Clinical Social Work is seeking post-discharge placement for this patient at the following level of care:   [place]   (*CSW will update this form in Epic as items are completed)   05/20/2014  Patient/family provided with [place] Department of Clinical Social Works list of facilities offering this level of care within the geographic area requested by the patient (or if unable, by the patients family).  05/20/2014  Patient/family informed of their freedom to choose among providers that offer the needed level of care, that participate in Medicare, Medicaid or managed care program needed by the patient, have an available bed and are willing to accept the patient.  05/20/2014  Patient/family informed of [facility] ownership interest in [facility], as well as of the fact that they are under no obligation to receive care at this facility.  PASARR submitted to EDS on 05/20/2014 PASARR number received on 05/20/2014  FL2 transmitted to all facilities in geographic area requested by pt/family on  05/20/2014 FL2 transmitted to all facilities within larger geographic area on 05/20/2014  Patient informed that his/her managed care company has contracts with or will negotiate with  certain facilities, including the following:   NA     Patient/family informed of bed offers received:  05/22/2014 Patient chooses bed at [facility], [place] Physician recommends and patient chooses bed at    Patient to be transferred to [place] on  05/22/2014 Patient to be transferred to facility by [facility]) Patient and family notified of transfer on 05/22/2014 Name of family member notified:   Carlis Stable- Daughter  The following physician request were entered in Epic: Physician Request  Please prepare priority discharge summary and prescriptions.  Please sign FL2.    Additional Comments: 05/22/14 OK per MD for d/c today to SNF.  Patient had contacted her brother [facility] to come and transport her but prior to d/c- nursing determined that her 02 sats were 88%.  Her brother did not have one of her portable 02 tanks- thus- d/c via EMS was indicated.  Nursing notified to call report; patient and daughter were agreable to d/c to SNF as patient's HR has been elevated lately. Patient worries about this and that she "doesnt feel well much of the time."  She feels that being around nurses at the facility is the best arrangement for herself at this time. CSW signing off.  Lorie Phenix. [place], [place]

## 2014-05-22 NOTE — Progress Notes (Signed)
Subjective: Overnight, had some increased tachycardia with movement and orthostatic vitals positive by blood pressure; held home lasix and gave gentle hydration with 75 cc of NS/hr x6 hours. Continued tachycardia in 90s overnight. Stable oxygen saturations and blood pressure.   [name] states that overnight she continued to feel dizzy and light-headed when getting out of bed; it may have been somewhat better this morning after the fluids. She denies any chest pain and states that her sensation of heart racing is "no more than normal."   States that she woke up with a headache this morning that is frontal and bitemporal. She says it feel like the typical HA she occasionally gets at home, and requests her PRN norco.   Objective: Vital signs in last 24 hours: Filed Vitals:   05/21/14 1748 05/21/14 2011 05/21/14 2052 05/22/14 0632  BP: 112/56  116/63 101/54  Pulse:   100 94  Temp:   98.3 F (36.8 C) 98.4 F (36.9 C)  TempSrc:   Oral Oral  Resp:   18 18  Height:      Weight:    82.918 kg (182 lb 12.8 oz)  SpO2:  97% 99% 98%   Weight change: 0.136 kg (4.8 oz)  Intake/Output Summary (Last 24 hours) at 05/22/14 0719 Last data filed at 05/22/14 0600  Gross per 24 hour  Intake    600 ml  Output    700 ml  Net   -100 ml   General: Sitting upright in bed in no acute distress.  CV: Irregular rhythm Pulm: Scattered bilateral wheezes.  Extremities: trace-1+ pitting edema of bilateral lower extremities   Lab Results: Basic Metabolic Panel:  Recent Labs  05/20/14 0655 05/22/14 0840  NA 140 141  K 4.1 5.3*  CL 95* 99  CO2 32 35*  GLUCOSE 92 111*  BUN 12 17  CREATININE 0.64 0.72  CALCIUM 8.8 8.9   Micro Results: Reviewed in EPIC; please see chart for full details.   Medications:  Scheduled Meds: . antiseptic oral rinse  7 mL Mouth Rinse BID  . busPIRone  10 mg Oral TID  . diltiazem  420 mg Oral Daily  . FLUoxetine  60 mg Oral Daily  . guaiFENesin  600 mg Oral BID  .  ipratropium-albuterol  3 mL Nebulization TID  . pantoprazole  40 mg Oral Daily  . potassium chloride  40 mEq Oral BID  . predniSONE  40 mg Oral Q breakfast   Continuous Infusions:  PRN Meds:.albuterol, ALPRAZolam, HYDROcodone-acetaminophen, HYDROcodone-homatropine, white petrolatum   Assessment/Plan: Kelsey Garcia is a 69 yo female with a PMH significant for gold stage III COPD, atrial fibrillation, possible HFpEF, OCD, GERD, osteoarthritis, HTN and hyperlipidemia who has now completed treatment for influenza, possible HCAP, and atrial fibrillation with RVR.   Principal Problem:   Acute on chronic respiratory failure Active Problems:   HLD (hyperlipidemia)   Atrial fibrillation with rapid ventricular response   COPD (chronic obstructive pulmonary disease)   Osteoarthritis   Anxiety   Long term current use of opiate analgesic   Chronic diastolic CHF (congestive heart failure)   HCAP (healthcare-associated pneumonia)   Influenza due to influenza A virus   Pancytopenia   Hemoptysis   Mediastinal and hilar lymphadenopathy on CT   Acute respiratory failure  ** Possible HCAP, influenza, possible volume overload: Now s/p 5 day course of oseltamivir for influenza and a 7 day course of vancomycin/cefepime/azithromycin for possible HCAP. Slowly weaned from partial NRB with significant improvement in  oxygenation with antibiotics, steroids, and diuresis. It is unclear if her underlying etiology was a viral pneumonia with subsequent ARDS type reaction vs HCAP. Now weaned to home oxygen requirement on nasal cannula. Influenza A positive, HIV antibody negative, legionella urinary antigen negative, Strep pneumoniae urinary antigen negative. Blood cultures from 3/31 and from 4/3 with no growth for 5 days.  -- Now s/p full 7 days of vancomycin, cefepime, and azithromycin.  -- Will hold lasix on discharge as below -- Will complete 5 day course of oral prednisone today  ** Tachycardia, dizziness, SOB with  standing: Tachycardia with ambulation may reflect intermittent atrial fibrillation w/ RVR vs anxiety vs volume depletion. Telemetry overnight reveals atrial fibrillation, some episodic bradycardia, and likely artifact read as asystole.  -- Repeat orthostatic vital signs this AM continue to show decreased BP with no change in HR -- Will hold lasix on discharge and re-start if needed  -- S/p 500 cc NS overnight  ** Atrial fibrillation with RVR: Tachycardic to 140s on admission in the setting of missing home diltiazem. S/p diltiazem drip, now on home po dose. Rate improved now. TSH mildly low at 0.293. T4 WNL at 1.04.  -- Home diltiazem (tiazac 24 hr capsule) 420 mg daily -- Defer anticoagulation due to patient preference -- Hemoglobin A1c 5.7, sightly elevated with no history of diabetes, will likely need follow- up as an outpatient  ** Anemia, thrombocytopenia: Hemoglobin down-trended over the course of admission in the setting of hemoptysis and bleeding from hemorrhoids.Heparin gtt started for atrial fibrillation with RVR was discontinued on 4/2. Platelets also down-trended. Improved now.  -- If continues outside the setting of acute infection, may need follow-up as an outpatient  ** COPD: GOLD stage III.  -- Continue DuoNebs Q6 hours -- Antiseptic oral rinse with steroid inhalers  ** HFpEF: Unclear history of CHF.  -- Continue Aspirin 81 mg -- Hold lasix as above  ** Hypertension: Appears well controlled.  -- Continue home dilltiazem  ** Hyperlipidemia: Lipid panel 10/12/2013 notable for total cholesterol 194, HDL 52, LDL 127, triglycerides 76. She is not on any statin therapy. -- Defer starting statin therapy as outpatient  ** Osteoarthritis: Complex pain management with previously being discharged from our clinic for becoming verbally abusive when discussing pain medications.  -- On norco, slightly lower dose than home dose  ** OCD, anxiety, chronic suicidality: She follows with  a therapist at [facility] and does not see a psychiatrist. 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 She does not endorse an active plan, but has thought about possible methods. -- Psychiatry consult, appreciate reccs -- Continue home buspar, prozac -- Now on xanax 0.25 mg TID for anxiety from home valium -- Will defer any changes to outpatient follow-up  **FEN/GI: -- Diet: Heart diet  ** PPx:  -- DVT: SCDs -- Home pantoprazole  ** Dispo:  -- Full code -- SW consulting for SNF placement, provided resources for establishing new PCP and at pain clinic -- Follow-up scheduled with [name] (cardiology) and [name] (pulmonology)  This is a Medical Student Note.  The care of the patient was discussed with Dr. Gordy Levan and the assessment and plan formulated with their assistance.  Please see their attached note for official documentation of the daily encounter.   LOS: 11 days   Dierdre Forth, Med Student 05/22/2014, 7:19 AM [phone]

## 2014-05-22 NOTE — Progress Notes (Signed)
Patient seen and examined. Case d/w residents in detail.   Physical Exam: Gen: AAO*3, NAd CVS: irregularly irregular Lungs: bibasilar crackles + Abd: soft, non tender, BS +  Patient is much improved today. Still with orthostasis on standing. Would hold lasix for now and encourage PO intake. Tachycardia has resolved. Would continue with diltiazem for afib with rvr.  Patient also noted to have mild hyperkalemia. Would recheck BMP at SNF tomorrow.  Patient completed course of abx of PNA.  No further w/u for now.  Patient stable for d/c to SNF today

## 2014-05-25 NOTE — Progress Notes (Signed)
Received call from [place] asking for clarification about Prednisone prescription. Pt d/c'd 05/22/14.    Upon review of AVS, I explained that prednisone is not on d/c med list.  [employer] employee stated she would d/c medication.  I explained that I cannot advise as pt was d/c'd 05/22/14 from [facility] and prescription could be new medication since d/c from hospital.  Phone number called: [phone].

## 2014-05-29 ENCOUNTER — Non-Acute Institutional Stay (SKILLED_NURSING_FACILITY): Payer: Medicare Other | Admitting: Internal Medicine

## 2014-05-29 ENCOUNTER — Encounter: Payer: Self-pay | Admitting: Internal Medicine

## 2014-05-29 DIAGNOSIS — J189 Pneumonia, unspecified organism: Secondary | ICD-10-CM | POA: Diagnosis not present

## 2014-05-29 DIAGNOSIS — M150 Primary generalized (osteo)arthritis: Secondary | ICD-10-CM

## 2014-05-29 DIAGNOSIS — K219 Gastro-esophageal reflux disease without esophagitis: Secondary | ICD-10-CM

## 2014-05-29 DIAGNOSIS — E785 Hyperlipidemia, unspecified: Secondary | ICD-10-CM | POA: Diagnosis not present

## 2014-05-29 DIAGNOSIS — J9611 Chronic respiratory failure with hypoxia: Secondary | ICD-10-CM

## 2014-05-29 DIAGNOSIS — I480 Paroxysmal atrial fibrillation: Secondary | ICD-10-CM

## 2014-05-29 DIAGNOSIS — I10 Essential (primary) hypertension: Secondary | ICD-10-CM

## 2014-05-29 DIAGNOSIS — M159 Polyosteoarthritis, unspecified: Secondary | ICD-10-CM

## 2014-05-29 DIAGNOSIS — F3342 Major depressive disorder, recurrent, in full remission: Secondary | ICD-10-CM

## 2014-05-29 DIAGNOSIS — R599 Enlarged lymph nodes, unspecified: Secondary | ICD-10-CM | POA: Diagnosis not present

## 2014-05-29 DIAGNOSIS — G894 Chronic pain syndrome: Secondary | ICD-10-CM | POA: Diagnosis not present

## 2014-05-29 DIAGNOSIS — R590 Localized enlarged lymph nodes: Secondary | ICD-10-CM
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
## 2014-06-04 ENCOUNTER — Ambulatory Visit (INDEPENDENT_AMBULATORY_CARE_PROVIDER_SITE_OTHER)
Admission: RE | Admit: 2014-06-04 | Discharge: 2014-06-04 | Disposition: A | Discharge status: Home / Self Care | Payer: Medicare Other | Source: Ambulatory Visit | Attending: Adult Health | Admitting: Adult Health

## 2014-06-04 ENCOUNTER — Ambulatory Visit (INDEPENDENT_AMBULATORY_CARE_PROVIDER_SITE_OTHER): Payer: Medicare Other | Admitting: Adult Health

## 2014-06-04 ENCOUNTER — Encounter: Payer: Self-pay | Admitting: Adult Health

## 2014-06-04 VITALS — BP 134/74 | HR 112 | Temp 99.0°F | Ht 65.5 in | Wt 191.6 lb

## 2014-06-04 DIAGNOSIS — J449 Chronic obstructive pulmonary disease, unspecified: Secondary | ICD-10-CM

## 2014-06-04 DIAGNOSIS — J9611 Chronic respiratory failure with hypoxia: Secondary | ICD-10-CM | POA: Diagnosis not present

## 2014-06-04 DIAGNOSIS — J189 Pneumonia, unspecified organism: Secondary | ICD-10-CM | POA: Diagnosis not present

## 2014-06-04 NOTE — Assessment & Plan Note (Signed)
PNA -clinically improved  Check cxr today   Plan  Continue on Symbicort and Spiriva Rinse after all inhalers Continue to work on stopping smoking Continue on oxygen Chest xray today .  Follow-up with [name] in 6 weeks  and as needed Please contact office for sooner follow up if symptoms do not improve or worsen or seek emergency care

## 2014-06-04 NOTE — Patient Instructions (Addendum)
Continue on Symbicort and Spiriva Rinse after all inhalers Continue to work on stopping smoking Continue on oxygen Chest xray today .  Follow-up with [name] in 6 weeks  and as needed Please contact office for sooner follow up if symptoms do not improve or worsen or seek emergency care

## 2014-06-04 NOTE — Progress Notes (Signed)
Subjective:    Patient ID: Kelsey Garcia, female    DOB: 02/26/1945, 69 y.o.   MRN: [id number]  HPI  58/ F, smoker presents for FU of COPD.  She has a h/o opiate abuse in the 82's. She is not prescribed codeine containing medications due to her addiction hisotry.  She reports dyspnea on usual activity & states that she would like abetter quality of life. CXR 12/09 >> hyperinflation. Sh e was hospitalised in 2004 & she lost 37 lbs of water weight.  Atrovent worked great, spiriva did not work as well. Advair helps in th emorning but not in the afternoon. It caused thrush.    03/15/2013  Chief Complaint  Patient presents with  . Advice Only    last seen 07/2008. F/u on COPD. Breathing is fine about 80% of the time-good and bad days. Late afternoons is the worse. Pt has cough from her GERD, rhinitis and her COPD. When related to allergies it is normally dry.    Returns for follow up after 5 years She was quite frank with me and admitted that the only reason she was here was so that she could get a prescription for codeine cough syrup which her PCP had not given her. She reports dyspnea class 2-3 is able to carry out her activities of daily living. He continues to smoke 0.5-1 pack per day and is not willing to commit to a quit attempt. She reports nasal congestion and denies wheezing, orthopnea paroxysmal nocturnal dyspnea or pedal edema. She is a dry cough sometimes keeps her up at night She is maintained on a regimen of Symbicort, spray for, Singulair and as needed albuterol. She also takes hydrocodone pills for DJD  PFT's 10/2012 show FEV1/FVC = 71% predicted, FEV1 = 49% predicted, DLCO 39% predicted >cont on spiriva and symbicort   12/2013  Follow up COPD  Returns for follow up for COPD  Remains on Symbicort and Spiriva .  She has chronic hypoxic resp failure on 2l/m continuous .  Has portable concentrator -Sim[employer] Go -Respironics.  Sats on RA 86% , on 2l/m sats 94%  Still smoking ,  advised on smoking cessation.  Does not smoke around oxygen  Advised on dangers of oxygen and smoking .  Does not want to quit, but is trying . Is working with  Therapist.  Uses SABA rarely. Had to use today , felt she had more congestion than usual.   She denies any hemoptysis, chest pain, orthopnea, PND, or increased leg swelling  She has diastolic congestive heart failure and is on Lasix daily.  06/04/2014 post hospital follow-up Patient returns for a post hospital follow-up Patient was admitted March 31 through April 12 for an acute on chronic hypoxic respiratory failure with COPD exacerbation, Influenza  and PNA.   She was treated with IV antibiotics, steroids, and nebulized bronchodilators. Unfortunately, patient continues to smoke. Cessation was discussed. She remains on Spiriva and Symbicort. He was discharged to rehabilitation center, undergoing PT .  Remains on O2.  Has not smoked since discharge. "says she is going to restart when she is back home" .  Denies chest pain, hemoptysis, fever , chest pain, orthopnea or edema.  Still coughing up thick mucus /white.  Lasix and Potassium held due to hypotension and hyperkalemia.  Says had labs at [facility] , potassium /lasix were not restarted.     Past Medical History  Diagnosis Date  . Atrial fibrillation   . Hypertension   . Insomnia   .  Nonischemic cardiomyopathy   . Hyperlipidemia   . Anxiety   . Cholelithiasis   . Insomnia   . Mitral regurgitation     noted 2010  . H/O epistaxis   . Rhinitis, allergic   . CHF (congestive heart failure)   . Pneumonia     "several times w/exacerbations of the COPD; nothing in the last year" (07/15/2012)  . COPD (chronic obstructive pulmonary disease)     as of 7/13 on 2-3L, pfts 10/2008 with mod obstruction  . Chronic bronchitis with COPD (chronic obstructive pulmonary disease)   . Shortness of breath     "all the time right now" (07/15/2012)  . GERD (gastroesophageal reflux disease)   .  TFTDDUKG(254.2)     "weekly" (07/15/2012)  . Migraines     "weekly for awhile; cleared up as I got older" (07/15/2012)  . DJD (degenerative joint disease)   . Arthritis     "all over" (07/15/2012)  . OCD (obsessive compulsive disorder)   . OCD (obsessive compulsive disorder)   . Depression     h/o SI; "last time I was really serious about it was ~ 1997" (07/15/2012)       Review of Systems Constitutional:   No  weight loss, night sweats,  Fevers, chills, +fatigue, or  lassitude.  HEENT:   No headaches,  Difficulty swallowing,  Tooth/dental problems, or  Sore throat,                No sneezing, itching, ear ache,  +nasal congestion, post nasal drip,   CV:  No chest pain,  Orthopnea, PND,  anasarca, dizziness, palpitations, syncope.   GI  No heartburn, indigestion, abdominal pain, nausea, vomiting, diarrhea, change in bowel habits, loss of appetite, bloody stools.   Resp:   No coughing up of blood.  Marland Kitchen  No chest wall deformity  Skin: no rash or lesions.  GU: no dysuria, change in color of urine, no urgency or frequency.  No flank pain, no hematuria   MS:  No joint pain or swelling.  No decreased range of motion.  No back pain.  Psych:  No change in mood or affect. No depression or anxiety.  No memory loss.        Objective:   Physical Exam GEN: A/Ox3; pleasant , NAD, elderly and overweight   HEENT:  [place]/AT,  EACs-clear, TMs-wnl, NOSE-clear, THROAT-clear, no lesions, no postnasal drip or exudate noted.   NECK:  Supple w/ fair ROM; no JVD; normal carotid impulses w/o bruits; no thyromegaly or nodules palpated; no lymphadenopathy.  RESP  Decreased BS in bases no accessory muscle use, no dullness to percussion  CARD:  RRR, no m/r/g  , no peripheral edema, pulses intact, no cyanosis or clubbing.  GI:   Soft & nt; nml bowel sounds; no organomegaly or masses detected.  Musco: Warm bil, no deformities or joint swelling noted.   Neuro: alert, no focal deficits noted.    Skin:  Warm, no lesions or rashes         Assessment & Plan:

## 2014-06-04 NOTE — Assessment & Plan Note (Signed)
Cont on O2 .  

## 2014-06-04 NOTE — Assessment & Plan Note (Signed)
Recent exacerbation now resolved   Plan  Continue on Symbicort and Spiriva Rinse after all inhalers Continue to work on stopping smoking Continue on oxygen Chest xray today .  Follow-up with [name] in 6 weeks  and as needed Please contact office for sooner follow up if symptoms do not improve or worsen or seek emergency care

## 2014-06-05 NOTE — Progress Notes (Signed)
Reviewed & agree with plan  

## 2014-06-07 ENCOUNTER — Encounter: Payer: Medicare Other | Admitting: Cardiovascular Disease

## 2014-06-08 ENCOUNTER — Non-Acute Institutional Stay (SKILLED_NURSING_FACILITY): Payer: Medicare Other | Admitting: Adult Health

## 2014-06-08 DIAGNOSIS — I4891 Unspecified atrial fibrillation: Secondary | ICD-10-CM | POA: Diagnosis not present

## 2014-06-08 DIAGNOSIS — I5032 Chronic diastolic (congestive) heart failure: Secondary | ICD-10-CM

## 2014-06-08 DIAGNOSIS — G894 Chronic pain syndrome: Secondary | ICD-10-CM | POA: Diagnosis not present

## 2014-06-08 DIAGNOSIS — J441 Chronic obstructive pulmonary disease with (acute) exacerbation: Secondary | ICD-10-CM | POA: Diagnosis not present

## 2014-06-08 NOTE — Progress Notes (Signed)
Quick Note:  Called spoke with patient, advised of cxr results / recs as stated by TP. Pt verbalized her understanding and denied any questions. Appt scheduled with RA for 6/21 at 3pm, pt aware to arrive early for cxr. Appt card mailed to verified home address. ______

## 2014-06-15 ENCOUNTER — Ambulatory Visit: Payer: Medicare Other | Attending: Family Medicine | Admitting: Physician Assistant

## 2014-06-15 VITALS — BP 147/86 | HR 140 | Temp 98.2°F | Resp 15 | Wt 188.6 lb

## 2014-06-15 DIAGNOSIS — J441 Chronic obstructive pulmonary disease with (acute) exacerbation: Secondary | ICD-10-CM | POA: Diagnosis not present

## 2014-06-15 DIAGNOSIS — Z79891 Long term (current) use of opiate analgesic: Secondary | ICD-10-CM | POA: Diagnosis not present

## 2014-06-15 DIAGNOSIS — M1610 Unilateral primary osteoarthritis, unspecified hip: Secondary | ICD-10-CM

## 2014-06-15 DIAGNOSIS — J9620 Acute and chronic respiratory failure, unspecified whether with hypoxia or hypercapnia: Secondary | ICD-10-CM | POA: Diagnosis not present

## 2014-06-15 DIAGNOSIS — I10 Essential (primary) hypertension: Secondary | ICD-10-CM | POA: Insufficient documentation

## 2014-06-15 DIAGNOSIS — F42 Obsessive-compulsive disorder: Secondary | ICD-10-CM | POA: Insufficient documentation

## 2014-06-15 DIAGNOSIS — G8929 Other chronic pain: Secondary | ICD-10-CM | POA: Insufficient documentation

## 2014-06-15 DIAGNOSIS — Z79899 Other long term (current) drug therapy: Secondary | ICD-10-CM | POA: Insufficient documentation

## 2014-06-15 DIAGNOSIS — Z9981 Dependence on supplemental oxygen: Secondary | ICD-10-CM | POA: Insufficient documentation

## 2014-06-15 DIAGNOSIS — J189 Pneumonia, unspecified organism: Secondary | ICD-10-CM | POA: Insufficient documentation

## 2014-06-15 DIAGNOSIS — K219 Gastro-esophageal reflux disease without esophagitis: Secondary | ICD-10-CM | POA: Insufficient documentation

## 2014-06-15 DIAGNOSIS — J09X1 Influenza due to identified novel influenza A virus with pneumonia: Secondary | ICD-10-CM | POA: Insufficient documentation

## 2014-06-15 DIAGNOSIS — I509 Heart failure, unspecified: Secondary | ICD-10-CM | POA: Insufficient documentation

## 2014-06-15 DIAGNOSIS — Z7982 Long term (current) use of aspirin: Secondary | ICD-10-CM | POA: Insufficient documentation

## 2014-06-15 DIAGNOSIS — I4891 Unspecified atrial fibrillation: Secondary | ICD-10-CM | POA: Insufficient documentation

## 2014-06-15 DIAGNOSIS — F418 Other specified anxiety disorders: Secondary | ICD-10-CM | POA: Diagnosis not present

## 2014-06-15 DIAGNOSIS — Z7951 Long term (current) use of inhaled steroids: Secondary | ICD-10-CM | POA: Diagnosis not present

## 2014-06-15 DIAGNOSIS — E875 Hyperkalemia: Secondary | ICD-10-CM | POA: Insufficient documentation

## 2014-06-15 DIAGNOSIS — G4700 Insomnia, unspecified: Secondary | ICD-10-CM | POA: Diagnosis not present

## 2014-06-15 DIAGNOSIS — R000 Tachycardia, unspecified: Secondary | ICD-10-CM | POA: Diagnosis not present

## 2014-06-15 LAB — BASIC METABOLIC PANEL
BUN: 8 mg/dL (ref 6–23)
CHLORIDE: 105 meq/L (ref 96–112)
CO2: 27 mEq/L (ref 19–32)
Calcium: 9 mg/dL (ref 8.4–10.5)
Creat: 0.71 mg/dL (ref 0.50–1.10)
Glucose, Bld: 89 mg/dL (ref 70–99)
POTASSIUM: 4.6 meq/L (ref 3.5–5.3)
SODIUM: 144 meq/L (ref 135–145)

## 2014-06-15 MED ORDER — HYDROCODONE-ACETAMINOPHEN 7.5-325 MG PO TABS: 1.0000 | ORAL_TABLET | Freq: Four times a day (QID) | ORAL | Status: DC | PRN

## 2014-06-15 NOTE — Patient Instructions (Signed)
Please get your rest today after getting her prescription filled Take diltiazem upon arrival home Take Valium on upon arrival home Check your pulse tonight. If greater than 130, please proceed to a local emergency department. Return here on Monday for follow-up. We will schedule to see your doctor here in approximately 2 weeks. Continue your other medications as prescribed.

## 2014-06-15 NOTE — Progress Notes (Signed)
Kelsey Garcia  YYT:[id number]  EXN:[id number]  DOB - 12-26-1945  Chief Complaint  Patient presents with  . Hospitalization Follow-up       Subjective:   Kelsey Garcia is a 69 y.o. female here today for establishment of care. She was hospitalized from 05-10-14 thru 05-22-14 for shortness of breath, cough, and dyspnea. She also was running a fever. She was diagnosed with acute on chronic respiratory failure secondary to influenza type A flu with a superimposed pneumonia and COPD exacerbation. She was started on antivirals, antibiotics, Mucinex, steroids and nebulization treatments. Her course complicated by atrial fibrillation with rapid ventricular response. She was given IV diltiazem and heparin. Systemic anticoagulation was discussed at discharge but she refused. She remains on aspirin. She remained in A. fib at discharge with a heart rate was controlled. Some point diltiazem is been stopped over the last couple weeks. She may have been hypotensive as for the reasoning.  She was discharged on 05/22/2014 to a skilled nursing facility. She was there for approx 18 days. She has since been home and has been doing a little better. Her wheezing is intermittent. Her cough has improved. He is chronically on 2 L of oxygen. She is seen pulmonary since discharge and no changes were made at that time. They did follow up an x-ray which looked a little better. It is recommended that she have a CT scan of her chest in July per her hospital records.  States she has pain especially in her right shoulder. She is out of her hydrocodone/acetaminophen..    ROS: GEN: denies fever or chills, denies change in weight HEENT: denies headache, earache, epistaxis, sore throat, or neck pain LUNGS: + SHOB, dyspnea, PND, orthopnea CV: denies CP or palpitations ABD: denies abd pain, N or V EXT: denies muscle spasms or swelling; no pain in lower ext, no weakness NEURO: denies numbness or tingling, denies sz, stroke or  TIA   ALLERGIES: Allergies  Allergen Reactions  . Ace Inhibitors Cough  . Diphenhydramine Hcl Other (See Comments)    "feels like my skin is crawling, and legs twitches"  . Erythromycin Diarrhea  . Nsaids Nausea And Vomiting  . Nyquil [Pseudoeph-Doxylamine-Dm-Apap] Itching  . Tramadol Hcl Other (See Comments)    stomach pain, hallucinations  . Flexeril [Cyclobenzaprine] Anxiety  . Pseudoephedrine Palpitations    PAST MEDICAL HISTORY: Past Medical History  Diagnosis Date  . Atrial fibrillation   . Hypertension   . Insomnia   . Nonischemic cardiomyopathy   . Hyperlipidemia   . Anxiety   . Cholelithiasis   . Insomnia   . Mitral regurgitation     noted 2010  . H/O epistaxis   . Rhinitis, allergic   . CHF (congestive heart failure)   . Pneumonia     "several times w/exacerbations of the COPD; nothing in the last year" (07/15/2012)  . COPD (chronic obstructive pulmonary disease)     as of 7/13 on 2-3L, pfts 10/2008 with mod obstruction  . Chronic bronchitis with COPD (chronic obstructive pulmonary disease)   . Shortness of breath     "all the time right now" (07/15/2012)  . GERD (gastroesophageal reflux disease)   . WHQPRFFM(384.6)     "weekly" (07/15/2012)  . Migraines     "weekly for awhile; cleared up as I got older" (07/15/2012)  . DJD (degenerative joint disease)   . Arthritis     "all over" (07/15/2012)  . OCD (obsessive compulsive disorder)   . OCD (obsessive compulsive disorder)   .  Depression     h/o SI; "last time I was really serious about it was ~ 1997" (07/15/2012)    PAST SURGICAL HISTORY: Past Surgical History  Procedure Laterality Date  . Tubal ligation  1972  . Cardioversion  2003; 07/2003    MEDICATIONS AT HOME: Prior to Admission medications   Medication Sig Start Date End Date Taking? Authorizing Provider  albuterol (PROVENTIL HFA;VENTOLIN HFA) 108 (90 BASE) MCG/ACT inhaler Inhale 1-2 puffs into the lungs every 6 (six) hours as needed for wheezing.  04/28/14   Tanna Furry, MD  aspirin 325 MG tablet Take 650 mg by mouth daily.     Historical Provider, MD  budesonide-formoterol (SYMBICORT) 160-4.5 MCG/ACT inhaler Inhale 2 puffs into the lungs 2 (two) times daily. 06/05/13   Annia Belt, MD  busPIRone (BUSPAR) 10 MG tablet Take 10 mg by mouth 3 (three) times daily. 04/22/14   Historical Provider, MD  chlorpheniramine (CHLOR-TRIMETON) 4 MG tablet Take 1 tablet (4 mg total) by mouth every 8 (eight) hours as needed for allergies. 11/13/13   Dellia Nims, MD  diazepam (VALIUM) 2 MG tablet Take 1 tablet (2 mg total) by mouth every 12 (twelve) hours as needed for anxiety. 12/06/12   Wilber Oliphant, MD  diltiazem (TIAZAC) 420 MG 24 hr capsule Take 1 capsule (420 mg total) by mouth daily. Patient not taking: Reported on 06/15/2014 05/09/14 08/08/15  Bartholomew Crews, MD  FLUoxetine (PROZAC) 20 MG capsule TAKE THREE CAPSULES BY MOUTH ONCE DAILY Patient taking differently: Take 60 mg by mouth. TAKE THREE CAPSULES BY MOUTH ONCE DAILY 01/03/14   Dellia Nims, MD  guaiFENesin (MUCINEX) 600 MG 12 hr tablet Take 1 tablet (600 mg total) by mouth 2 (two) times daily. 04/28/14   Tanna Furry, MD  HYDROcodone-acetaminophen (NORCO) 7.5-325 MG per tablet Take 1-2 tablets by mouth every 6 (six) hours as needed for moderate pain. 06/15/14 07/10/15  Yeiren Whitecotton Daneil Dan, PA-C  HYDROcodone-homatropine (HYCODAN) 5-1.5 MG/5ML syrup Take 5 mLs by mouth every 6 (six) hours as needed for cough. 04/30/14   Bartholomew Crews, MD  nystatin (MYCOSTATIN) 100000 UNIT/ML suspension Take 5 mLs (500,000 Units total) by mouth as needed. Patient taking differently: Take 500,000 Units by mouth as needed (thrush).  04/16/14   Tasrif Ahmed, MD  pantoprazole (PROTONIX) 40 MG tablet TAKE ONE TABLET BY MOUTH TWICE DAILY 01/03/14   Dellia Nims, MD  tiotropium (SPIRIVA) 18 MCG inhalation capsule Place 1 capsule (18 mcg total) into inhaler and inhale daily. 07/05/13   Pollie Friar, MD  zolpidem (AMBIEN)  5 MG tablet Take 1 tablet (5 mg total) by mouth at bedtime as needed for sleep. 03/09/14   Dellia Nims, MD     Objective:   Filed Vitals:   06/15/14 0943  BP: 147/86  Pulse: 140  Temp: 98.2 F (36.8 C)  Resp: 15  Weight: 188 lb 9.6 oz (85.548 kg)  SpO2: 94%    Exam General appearance : Awake, alert, not in any distress. Speech Clear. Not toxic looking. Anxious. Chronically ill appearing. HEENT: Atraumatic and Normocephalic, pupils equally reactive to light and accomodation Neck: supple, no JVD. No cervical lymphadenopathy.  Chest:Good air entry bilaterally, no added sounds no wheezes or crackles YDX:AJOINOMVEHM irregular and fast. No murmur.  Neurology: Awake alert, and oriented X 3, CN II-XII intact, Non focal; anxious   Data Review Lab Results  Component Value Date   HGBA1C 5.7* 05/11/2014   HGBA1C 5.5 09/24/2010     Assessment &  Plan  1. Acute on chronic respiratory failure, multifactorial, improved  -Influenza A virus, resolved  -COPD exacerbation, improved  -Pneumonia, resolving 2. Hypo-/hyperkalemia  -BMP today  3. COPD  -I did not prescribe nebs today secondary to tachycardia  -She has completed her course of steroids and antibiotics  -Cont inhalers/ routine meds  -Keep appt with Pulm-07/31/14 @ 3 pm  -At discharge a CT scan of chest was recommended for repeat in July 4. Cafib w/ RVR  -She refuses to be re hospitalized  -Could not give her beta blockers in clinic secondary to her COPD  -Restart diltiazem 420 mg daily  -She was on heparin intravenously in the hospital. Systemic anticoagulation at discharge was  discussed. She refused further anticoagulation. However, her Chads 2 score equals 1.  Continue aspirin 325 mg daily for now.  -follow up on Monday for HR check in walk-in clinic 5. OCD/anxiety with depression  -Continue anxiolytics  -may benefit from referral to [facility] at some point 6. Chronic pain  -refill hydro/acetaminophen  -cont sports  medicine follow up w/Dr. Micheline Chapman  Return in about 3 days (around 06/18/2014). for HR check. Return in 2 weeks with Dr. Adrian Blackwater.  The patient was given clear instructions to go to ER or return to medical center if symptoms don't improve, worsen or new problems develop. The patient verbalized understanding. The patient was told to call to get lab results if they haven't heard anything in the next week.   This note has been created with Surveyor, quantity. Any transcriptional errors are unintentional.    Zettie Pho, PA-C [facility], [place]   06/15/2014, 11:37 AM

## 2014-06-15 NOTE — Progress Notes (Signed)
Patient here for follow up from hospital Was admitted for pneumonia At time of discharge patient had been taken off of her potassium Diltiazem and her lasix

## 2014-06-18 ENCOUNTER — Ambulatory Visit: Payer: Medicare Other | Admitting: Family Medicine

## 2014-06-19 ENCOUNTER — Encounter (HOSPITAL_COMMUNITY): Payer: Self-pay

## 2014-06-19 ENCOUNTER — Ambulatory Visit: Payer: Medicare Other | Admitting: Family Medicine

## 2014-06-19 ENCOUNTER — Emergency Department (HOSPITAL_COMMUNITY): Payer: Medicare Other

## 2014-06-19 ENCOUNTER — Inpatient Hospital Stay (HOSPITAL_COMMUNITY)
Admission: EM | Admit: 2014-06-19 | Discharge: 2014-06-22 | DRG: 189 | Disposition: A | Discharge status: Home / Self Care | Payer: Medicare Other | Attending: Internal Medicine | Admitting: Internal Medicine

## 2014-06-19 DIAGNOSIS — Z7982 Long term (current) use of aspirin: Secondary | ICD-10-CM | POA: Diagnosis not present

## 2014-06-19 DIAGNOSIS — I10 Essential (primary) hypertension: Secondary | ICD-10-CM | POA: Diagnosis not present

## 2014-06-19 DIAGNOSIS — K219 Gastro-esophageal reflux disease without esophagitis: Secondary | ICD-10-CM | POA: Diagnosis present

## 2014-06-19 DIAGNOSIS — Z888 Allergy status to other drugs, medicaments and biological substances status: Secondary | ICD-10-CM

## 2014-06-19 DIAGNOSIS — J441 Chronic obstructive pulmonary disease with (acute) exacerbation: Secondary | ICD-10-CM | POA: Diagnosis present

## 2014-06-19 DIAGNOSIS — I4891 Unspecified atrial fibrillation: Secondary | ICD-10-CM | POA: Diagnosis present

## 2014-06-19 DIAGNOSIS — I429 Cardiomyopathy, unspecified: Secondary | ICD-10-CM | POA: Diagnosis present

## 2014-06-19 DIAGNOSIS — I5032 Chronic diastolic (congestive) heart failure: Secondary | ICD-10-CM | POA: Diagnosis present

## 2014-06-19 DIAGNOSIS — E785 Hyperlipidemia, unspecified: Secondary | ICD-10-CM | POA: Diagnosis present

## 2014-06-19 DIAGNOSIS — M1610 Unilateral primary osteoarthritis, unspecified hip: Secondary | ICD-10-CM | POA: Diagnosis present

## 2014-06-19 DIAGNOSIS — Z9981 Dependence on supplemental oxygen: Secondary | ICD-10-CM | POA: Diagnosis not present

## 2014-06-19 DIAGNOSIS — F17210 Nicotine dependence, cigarettes, uncomplicated: Secondary | ICD-10-CM | POA: Diagnosis present

## 2014-06-19 DIAGNOSIS — G43909 Migraine, unspecified, not intractable, without status migrainosus: Secondary | ICD-10-CM | POA: Diagnosis present

## 2014-06-19 DIAGNOSIS — F42 Obsessive-compulsive disorder: Secondary | ICD-10-CM | POA: Diagnosis present

## 2014-06-19 DIAGNOSIS — F419 Anxiety disorder, unspecified: Secondary | ICD-10-CM | POA: Diagnosis present

## 2014-06-19 DIAGNOSIS — J9620 Acute and chronic respiratory failure, unspecified whether with hypoxia or hypercapnia: Secondary | ICD-10-CM | POA: Diagnosis present

## 2014-06-19 DIAGNOSIS — J449 Chronic obstructive pulmonary disease, unspecified: Secondary | ICD-10-CM

## 2014-06-19 DIAGNOSIS — I340 Nonrheumatic mitral (valve) insufficiency: Secondary | ICD-10-CM | POA: Diagnosis present

## 2014-06-19 DIAGNOSIS — F329 Major depressive disorder, single episode, unspecified: Secondary | ICD-10-CM | POA: Diagnosis present

## 2014-06-19 DIAGNOSIS — J9621 Acute and chronic respiratory failure with hypoxia: Principal | ICD-10-CM | POA: Diagnosis present

## 2014-06-19 DIAGNOSIS — I482 Chronic atrial fibrillation: Secondary | ICD-10-CM | POA: Diagnosis not present

## 2014-06-19 LAB — CREATININE, SERUM: Creatinine, Ser: 0.55 mg/dL (ref 0.44–1.00)

## 2014-06-19 LAB — I-STAT ARTERIAL BLOOD GAS, ED
ACID-BASE EXCESS: 2 mmol/L (ref 0.0–2.0)
BICARBONATE: 26.8 meq/L — AB (ref 20.0–24.0)
O2 Saturation: 94 %
PCO2 ART: 42.5 mmHg (ref 35.0–45.0)
TCO2: 28 mmol/L (ref 0–100)
pH, Arterial: 7.408 (ref 7.350–7.450)
pO2, Arterial: 71 mmHg — ABNORMAL LOW (ref 80.0–100.0)

## 2014-06-19 LAB — CBC WITH DIFFERENTIAL/PLATELET
BASOS ABS: 0 10*3/uL (ref 0.0–0.1)
Basophils Relative: 0 % (ref 0–1)
Eosinophils Absolute: 0 10*3/uL (ref 0.0–0.7)
Eosinophils Relative: 1 % (ref 0–5)
HEMATOCRIT: 34.4 % — AB (ref 36.0–46.0)
Hemoglobin: 10.7 g/dL — ABNORMAL LOW (ref 12.0–15.0)
LYMPHS ABS: 1 10*3/uL (ref 0.7–4.0)
Lymphocytes Relative: 16 % (ref 12–46)
MCH: 31.2 pg (ref 26.0–34.0)
MCHC: 31.1 g/dL (ref 30.0–36.0)
MCV: 100.3 fL — AB (ref 78.0–100.0)
MONO ABS: 0.5 10*3/uL (ref 0.1–1.0)
Monocytes Relative: 8 % (ref 3–12)
Neutro Abs: 4.8 10*3/uL (ref 1.7–7.7)
Neutrophils Relative %: 75 % (ref 43–77)
Platelets: 227 10*3/uL (ref 150–400)
RBC: 3.43 MIL/uL — AB (ref 3.87–5.11)
RDW: 14.8 % (ref 11.5–15.5)
WBC: 6.3 10*3/uL (ref 4.0–10.5)

## 2014-06-19 LAB — CBC
HEMATOCRIT: 34.7 % — AB (ref 36.0–46.0)
Hemoglobin: 10.9 g/dL — ABNORMAL LOW (ref 12.0–15.0)
MCH: 31 pg (ref 26.0–34.0)
MCHC: 31.4 g/dL (ref 30.0–36.0)
MCV: 98.6 fL (ref 78.0–100.0)
Platelets: 241 10*3/uL (ref 150–400)
RBC: 3.52 MIL/uL — ABNORMAL LOW (ref 3.87–5.11)
RDW: 14.9 % (ref 11.5–15.5)
WBC: 5.5 10*3/uL (ref 4.0–10.5)

## 2014-06-19 LAB — BASIC METABOLIC PANEL
Anion gap: 7 (ref 5–15)
BUN: 10 mg/dL (ref 6–20)
CALCIUM: 8.9 mg/dL (ref 8.9–10.3)
CO2: 29 mmol/L (ref 22–32)
Chloride: 103 mmol/L (ref 101–111)
Creatinine, Ser: 0.57 mg/dL (ref 0.44–1.00)
GFR calc Af Amer: >60 mL/min (ref >60)
GLUCOSE: 120 mg/dL — AB (ref 70–99)
Potassium: 3.8 mmol/L (ref 3.5–5.1)
Sodium: 139 mmol/L (ref 135–145)

## 2014-06-19 LAB — I-STAT TROPONIN, ED: Troponin i, poc: 0 ng/mL (ref 0.00–0.08)

## 2014-06-19 LAB — TROPONIN I

## 2014-06-19 LAB — BRAIN NATRIURETIC PEPTIDE
B NATRIURETIC PEPTIDE 5: 441.4 pg/mL — AB (ref 0.0–100.0)
B Natriuretic Peptide: 418.5 pg/mL — ABNORMAL HIGH (ref 0.0–100.0)

## 2014-06-19 MED ORDER — LEVALBUTEROL HCL 0.63 MG/3ML IN NEBU
0.6300 mg | INHALATION_SOLUTION | Freq: Four times a day (QID) | RESPIRATORY_TRACT | Status: DC | PRN
  Administered 2014-06-19: 0.63 mg via RESPIRATORY_TRACT
  Filled 2014-06-19 (×2): qty 3

## 2014-06-19 MED ORDER — DILTIAZEM HCL ER BEADS 300 MG PO CP24
420.0000 mg | ORAL_CAPSULE | Freq: Every day | ORAL | Status: DC
  Administered 2014-06-20 – 2014-06-22 (×3): 420 mg via ORAL
  Filled 2014-06-19 (×4): qty 1

## 2014-06-19 MED ORDER — GUAIFENESIN-DM 100-10 MG/5ML PO SYRP
5.0000 mL | ORAL_SOLUTION | ORAL | Status: DC | PRN
  Filled 2014-06-19: qty 5

## 2014-06-19 MED ORDER — PANTOPRAZOLE SODIUM 40 MG PO TBEC
40.0000 mg | DELAYED_RELEASE_TABLET | Freq: Two times a day (BID) | ORAL | Status: DC
  Administered 2014-06-19 – 2014-06-20 (×3): 40 mg via ORAL
  Filled 2014-06-19 (×3): qty 1

## 2014-06-19 MED ORDER — ONDANSETRON HCL 4 MG PO TABS: 4.0000 mg | ORAL_TABLET | Freq: Four times a day (QID) | ORAL | Status: DC | PRN

## 2014-06-19 MED ORDER — ACETAMINOPHEN 650 MG RE SUPP: 650.0000 mg | Freq: Four times a day (QID) | RECTAL | Status: DC | PRN

## 2014-06-19 MED ORDER — SODIUM CHLORIDE 0.9 % IJ SOLN
3.0000 mL | Freq: Two times a day (BID) | INTRAMUSCULAR | Status: DC
  Administered 2014-06-19 – 2014-06-22 (×6): 3 mL via INTRAVENOUS

## 2014-06-19 MED ORDER — METHYLPREDNISOLONE SODIUM SUCC 125 MG IJ SOLR
125.0000 mg | Freq: Once | INTRAMUSCULAR | Status: AC
  Administered 2014-06-19: 125 mg via INTRAVENOUS
  Filled 2014-06-19: qty 2

## 2014-06-19 MED ORDER — METHYLPREDNISOLONE SODIUM SUCC 125 MG IJ SOLR
60.0000 mg | Freq: Two times a day (BID) | INTRAMUSCULAR | Status: DC
  Administered 2014-06-20 – 2014-06-21 (×5): 60 mg via INTRAVENOUS
  Filled 2014-06-19 (×2): qty 0.96
  Filled 2014-06-19: qty 2
  Filled 2014-06-19 (×4): qty 0.96
  Filled 2014-06-19: qty 2

## 2014-06-19 MED ORDER — SENNOSIDES-DOCUSATE SODIUM 8.6-50 MG PO TABS
1.0000 | ORAL_TABLET | Freq: Every evening | ORAL | Status: DC | PRN
  Administered 2014-06-21: 1 via ORAL
  Filled 2014-06-19: qty 1

## 2014-06-19 MED ORDER — FUROSEMIDE 40 MG PO TABS
40.0000 mg | ORAL_TABLET | Freq: Two times a day (BID) | ORAL | Status: DC
  Administered 2014-06-20 – 2014-06-21 (×3): 40 mg via ORAL
  Filled 2014-06-19 (×6): qty 1

## 2014-06-19 MED ORDER — HYPROMELLOSE (GONIOSCOPIC) 2.5 % OP SOLN
1.0000 [drp] | OPHTHALMIC | Status: DC | PRN
  Filled 2014-06-19: qty 15

## 2014-06-19 MED ORDER — HYDROCODONE-ACETAMINOPHEN 7.5-325 MG PO TABS
1.0000 | ORAL_TABLET | Freq: Four times a day (QID) | ORAL | Status: DC | PRN
  Administered 2014-06-19 – 2014-06-22 (×9): 2 via ORAL
  Filled 2014-06-19 (×10): qty 2

## 2014-06-19 MED ORDER — SODIUM CHLORIDE 0.9 % IV SOLN: 250.0000 mL | INTRAVENOUS | Status: DC | PRN

## 2014-06-19 MED ORDER — IPRATROPIUM-ALBUTEROL 0.5-2.5 (3) MG/3ML IN SOLN
3.0000 mL | Freq: Once | RESPIRATORY_TRACT | Status: AC
  Administered 2014-06-19: 3 mL via RESPIRATORY_TRACT
  Filled 2014-06-19: qty 3

## 2014-06-19 MED ORDER — ALUM & MAG HYDROXIDE-SIMETH 200-200-20 MG/5ML PO SUSP: 30.0000 mL | Freq: Four times a day (QID) | ORAL | Status: DC | PRN

## 2014-06-19 MED ORDER — FLUOXETINE HCL 20 MG PO CAPS
60.0000 mg | ORAL_CAPSULE | Freq: Every day | ORAL | Status: DC
  Administered 2014-06-19 – 2014-06-22 (×4): 60 mg via ORAL
  Filled 2014-06-19 (×4): qty 3

## 2014-06-19 MED ORDER — ONDANSETRON HCL 4 MG/2ML IJ SOLN: 4.0000 mg | Freq: Four times a day (QID) | INTRAMUSCULAR | Status: DC | PRN

## 2014-06-19 MED ORDER — LEVOFLOXACIN 750 MG PO TABS
750.0000 mg | ORAL_TABLET | Freq: Every day | ORAL | Status: DC
  Administered 2014-06-20 – 2014-06-22 (×3): 750 mg via ORAL
  Filled 2014-06-19 (×4): qty 1

## 2014-06-19 MED ORDER — ASPIRIN EC 325 MG PO TBEC
325.0000 mg | DELAYED_RELEASE_TABLET | Freq: Every day | ORAL | Status: DC
  Administered 2014-06-19 – 2014-06-22 (×4): 325 mg via ORAL
  Filled 2014-06-19 (×4): qty 1

## 2014-06-19 MED ORDER — HEPARIN SODIUM (PORCINE) 5000 UNIT/ML IJ SOLN
5000.0000 [IU] | Freq: Three times a day (TID) | INTRAMUSCULAR | Status: DC
  Administered 2014-06-21 – 2014-06-22 (×2): 5000 [IU] via SUBCUTANEOUS
  Filled 2014-06-19 (×9): qty 1

## 2014-06-19 MED ORDER — DIAZEPAM 2 MG PO TABS: 2.0000 mg | ORAL_TABLET | Freq: Two times a day (BID) | ORAL | Status: DC | PRN

## 2014-06-19 MED ORDER — SODIUM CHLORIDE 0.9 % IJ SOLN
3.0000 mL | INTRAMUSCULAR | Status: DC | PRN
Start: 2014-06-19 — End: 2014-06-22

## 2014-06-19 MED ORDER — DIAZEPAM 2 MG PO TABS
2.0000 mg | ORAL_TABLET | Freq: Once | ORAL | Status: AC
  Administered 2014-06-19: 2 mg via ORAL
  Filled 2014-06-19: qty 1

## 2014-06-19 MED ORDER — BUSPIRONE HCL 10 MG PO TABS
10.0000 mg | ORAL_TABLET | Freq: Three times a day (TID) | ORAL | Status: DC
  Administered 2014-06-19 – 2014-06-22 (×10): 10 mg via ORAL
  Filled 2014-06-19 (×11): qty 1

## 2014-06-19 MED ORDER — ZOLPIDEM TARTRATE 5 MG PO TABS: 5.0000 mg | ORAL_TABLET | Freq: Every evening | ORAL | Status: DC | PRN

## 2014-06-19 MED ORDER — ACETAMINOPHEN 325 MG PO TABS: 650.0000 mg | ORAL_TABLET | Freq: Four times a day (QID) | ORAL | Status: DC | PRN

## 2014-06-19 NOTE — Progress Notes (Signed)
Pt. reluctanly provided staff with her albuterol inhaler.  Without RN instruction, Pt. Took 2 puffs from inhaler before giving to RN.  Educated pt. That medications could not be stored at bedside.  Jillyn Ledger, MBA, BS, RN

## 2014-06-19 NOTE — ED Provider Notes (Signed)
CSN: [id number]     Arrival date & time 06/19/14  0762 History   First MD Initiated Contact with Patient 06/19/14 [id number]     Chief Complaint  Patient presents with  . Respiratory Distress     (Consider location/radiation/quality/duration/timing/severity/associated sxs/prior Treatment) HPI Comments: 69 year old female history of COPD comes in with shortness of breath. Has been short of breath for approximately 24 hours. Has been constant and slowly worsening. Has been taking home inhalers without significant relief. Called EMS this morning upon fire arrival reportedly saturations 78%.   Patient is a 69 y.o. female presenting with shortness of breath.  Shortness of Breath Severity:  Moderate Onset quality:  Gradual Duration:  1 day Timing:  Constant Progression:  Worsening Chronicity:  Recurrent Context comment:  None known Relieved by:  Nothing Worsened by:  Activity Ineffective treatments:  Inhaler Associated symptoms: cough   Associated symptoms: no abdominal pain, no chest pain, no fever, no headaches and no rash   Risk factors: tobacco use     Past Medical History  Diagnosis Date  . Atrial fibrillation   . Hypertension   . Insomnia   . Nonischemic cardiomyopathy   . Hyperlipidemia   . Anxiety   . Cholelithiasis   . Insomnia   . Mitral regurgitation     noted 2010  . H/O epistaxis   . Rhinitis, allergic   . CHF (congestive heart failure)   . Pneumonia     "several times w/exacerbations of the COPD; nothing in the last year" (07/15/2012)  . COPD (chronic obstructive pulmonary disease)     as of 7/13 on 2-3L, pfts 10/2008 with mod obstruction  . Chronic bronchitis with COPD (chronic obstructive pulmonary disease)   . Shortness of breath     "all the time right now" (07/15/2012)  . GERD (gastroesophageal reflux disease)   . HLKTGYBW(389.3)     "weekly" (07/15/2012)  . Migraines     "weekly for awhile; cleared up as I got older" (07/15/2012)  . DJD (degenerative joint  disease)   . Arthritis     "all over" (07/15/2012)  . OCD (obsessive compulsive disorder)   . OCD (obsessive compulsive disorder)   . Depression     h/o SI; "last time I was really serious about it was ~ 1997" (07/15/2012)   Past Surgical History  Procedure Laterality Date  . Tubal ligation  1972  . Cardioversion  2003; 07/2003   Family History  Problem Relation Age of Onset  . Asthma    . Emphysema    . Allergies    . Cancer      aunt had several types of cancer   History  Substance Use Topics  . Smoking status: Current Every Day Smoker -- 1.00 packs/day for 35 years    Types: Cigarettes  . Smokeless tobacco: Never Used     Comment: No desire to quit at this time./ 1PPD  . Alcohol Use: No   OB History    No data available     Review of Systems  Constitutional: Negative for fever.  HENT: Negative for congestion.   Respiratory: Positive for cough and shortness of breath.   Cardiovascular: Negative for chest pain.  Gastrointestinal: Negative for abdominal pain.  Musculoskeletal: Negative for back pain.  Skin: Negative for rash.  Neurological: Negative for headaches.  Psychiatric/Behavioral: The patient is nervous/anxious.   All other systems reviewed and are negative.     Allergies  Ace inhibitors; Diphenhydramine hcl; Erythromycin; Nsaids; Nyquil; Tramadol  hcl; Flexeril; and Pseudoephedrine  Home Medications   Prior to Admission medications   Medication Sig Start Date End Date Taking? Authorizing Provider  albuterol (PROVENTIL HFA;VENTOLIN HFA) 108 (90 BASE) MCG/ACT inhaler Inhale 1-2 puffs into the lungs every 6 (six) hours as needed for wheezing. 04/28/14   Tanna Furry, MD  aspirin 325 MG tablet Take 650 mg by mouth daily.     Historical Provider, MD  budesonide-formoterol (SYMBICORT) 160-4.5 MCG/ACT inhaler Inhale 2 puffs into the lungs 2 (two) times daily. 06/05/13   Annia Belt, MD  busPIRone (BUSPAR) 10 MG tablet Take 10 mg by mouth 3 (three) times  daily. 04/22/14   Historical Provider, MD  chlorpheniramine (CHLOR-TRIMETON) 4 MG tablet Take 1 tablet (4 mg total) by mouth every 8 (eight) hours as needed for allergies. 11/13/13   Dellia Nims, MD  diazepam (VALIUM) 2 MG tablet Take 1 tablet (2 mg total) by mouth every 12 (twelve) hours as needed for anxiety. 12/06/12   Wilber Oliphant, MD  diltiazem (TIAZAC) 420 MG 24 hr capsule Take 1 capsule (420 mg total) by mouth daily. Patient not taking: Reported on 06/15/2014 05/09/14 08/08/15  Bartholomew Crews, MD  FLUoxetine (PROZAC) 20 MG capsule TAKE THREE CAPSULES BY MOUTH ONCE DAILY Patient taking differently: Take 60 mg by mouth. TAKE THREE CAPSULES BY MOUTH ONCE DAILY 01/03/14   Dellia Nims, MD  guaiFENesin (MUCINEX) 600 MG 12 hr tablet Take 1 tablet (600 mg total) by mouth 2 (two) times daily. 04/28/14   Tanna Furry, MD  HYDROcodone-acetaminophen (NORCO) 7.5-325 MG per tablet Take 1-2 tablets by mouth every 6 (six) hours as needed for moderate pain. 06/15/14 07/10/15  Tiffany Daneil Dan, PA-C  HYDROcodone-homatropine (HYCODAN) 5-1.5 MG/5ML syrup Take 5 mLs by mouth every 6 (six) hours as needed for cough. 04/30/14   Bartholomew Crews, MD  nystatin (MYCOSTATIN) 100000 UNIT/ML suspension Take 5 mLs (500,000 Units total) by mouth as needed. Patient taking differently: Take 500,000 Units by mouth as needed (thrush).  04/16/14   Tasrif Ahmed, MD  pantoprazole (PROTONIX) 40 MG tablet TAKE ONE TABLET BY MOUTH TWICE DAILY 01/03/14   Dellia Nims, MD  tiotropium (SPIRIVA) 18 MCG inhalation capsule Place 1 capsule (18 mcg total) into inhaler and inhale daily. 07/05/13   Pollie Friar, MD  zolpidem (AMBIEN) 5 MG tablet Take 1 tablet (5 mg total) by mouth at bedtime as needed for sleep. 03/09/14   Tasrif Ahmed, MD   BP 142/104 mmHg  Pulse 101  Temp(Src) 99 F (37.2 C) (Oral)  Resp 20  Ht '5\' 5"'$  (1.651 m)  Wt 188 lb (85.276 kg)  BMI 31.28 kg/m2  SpO2 97% Physical Exam  Constitutional: She is oriented to person,  place, and time. She appears well-developed.  HENT:  Head: Normocephalic and atraumatic.  Eyes: Pupils are equal, round, and reactive to light.  Neck: Normal range of motion.  Cardiovascular:  Slight tachycardia  Pulmonary/Chest: She is in respiratory distress. She has wheezes.  Diffusely wheezing in all fields.  Abdominal: Soft. She exhibits no distension. There is no tenderness.  Musculoskeletal: She exhibits no edema.  Neurological: She is alert and oriented to person, place, and time. She exhibits normal muscle tone.  Skin: Skin is warm. No rash noted.  Psychiatric:  Patient endorses anxiety.  Vitals reviewed.   ED Course  Procedures (including critical care time) Labs Review Labs Reviewed  BASIC METABOLIC PANEL - Abnormal; Notable for the following:    Glucose, Bld 120 (*)  All other components within normal limits  CBC WITH DIFFERENTIAL/PLATELET - Abnormal; Notable for the following:    RBC 3.43 (*)    Hemoglobin 10.7 (*)    HCT 34.4 (*)    MCV 100.3 (*)    All other components within normal limits  BRAIN NATRIURETIC PEPTIDE - Abnormal; Notable for the following:    B Natriuretic Peptide 418.5 (*)    All other components within normal limits  I-STAT ARTERIAL BLOOD GAS, ED - Abnormal; Notable for the following:    pO2, Arterial 71.0 (*)    Bicarbonate 26.8 (*)    All other components within normal limits  I-STAT TROPOININ, ED    Imaging Review Dg Chest 2 View  06/19/2014   CLINICAL DATA:  69 year old female with shortness of breath, cough, respiratory distress. Initial encounter.  EXAM: CHEST  2 VIEW  COMPARISON:  06/04/2014 and earlier.  FINDINGS: Improved ventilation since early April when right upper lung consolidation was noted. Residual reticulonodular opacity throughout both lungs, acute on chronic. No superimposed pneumothorax. There are small pleural effusions which appear increased. Stable cardiac size and mediastinal contours. Visualized tracheal air column  is within normal limits. No acute osseous abnormality identified.  IMPRESSION: Small bilateral pleural effusions appear increased since April. Otherwise stable ventilation with residual acute on chronic interstitial opacity.  Airspace consolidation in the right upper lung seen in early April has resolved.   Electronically Signed   By: Genevie Ann M.D.   On: 06/19/2014 11:15     EKG Interpretation   Date/Time:  Tuesday Jun 19 2014 09:59:25 EDT Ventricular Rate:  96 PR Interval:  171 QRS Duration: 91 QT Interval:  375 QTC Calculation: 474 R Axis:   69 Text Interpretation:  Sinus rhythm Low voltage, precordial leads Consider  anterior infarct Nonspecific T abnormalities, lateral leads difficult  interpretation due to baseline artifact and wander Confirmed by GOLDSTON   MD, SCOTT ([id number]) on 06/19/2014 10:06:32 AM      MDM  69 year old female comes in with signed symptoms consistent with a COPD exacerbation. She does report coming home from a nursing facility approximate 4 days ago. At that time she began smoking again. On arrival she is diffusely wheezy throughout all lung fields. Per EMS was hypoxic to 78% in the field however upon arrival here placed on 4 L nasal cannula maintaining saturations 90% or better. Patient was given Solu-Medrol 125 and multiple breathing treatments. Some subjective relief. However patient saw requiring more oxygen than baseline. Based on her exam she'll likely need continued breathing treatments. Discussed with trial hospitalist who will admit for COPD exacerbation. Initial EKG showed no signs of ischemia. Initial troponin negative. ABG showed hypoxia with a P O2 of 71. CBC BMP unremarkable. Chest x-ray showed no consolidative process, pneumothorax or other serious. BNP not significantly elevated. Patient also with minimal physical exam findings consistent with a CHF exacerbation. Remained stable in the ED until transfer to floor  Final diagnoses:  Chronic obstructive  pulmonary disease, unspecified COPD, unspecified chronic bronchitis type        Robynn Pane, MD 06/19/14 [place], MD 06/24/14 [id number]

## 2014-06-19 NOTE — ED Notes (Signed)
Dr. Regenia Skeeter back at the bedside to update pt on admission status and check on patient.

## 2014-06-19 NOTE — ED Notes (Signed)
Pt given some water to drink.

## 2014-06-19 NOTE — ED Notes (Signed)
Patient transported to X-ray 

## 2014-06-19 NOTE — ED Notes (Signed)
RT at bedside for abg and breathing treatment

## 2014-06-19 NOTE — ED Notes (Signed)
Pt returned from x-ray. Pt placed back on monitor.

## 2014-06-19 NOTE — Progress Notes (Signed)
ANTIBIOTIC CONSULT NOTE - INITIAL  Pharmacy Consult for levofloxacin Indication: COPD exacerbation  Allergies  Allergen Reactions  . Ace Inhibitors Cough  . Diphenhydramine Hcl Other (See Comments)    "feels like my skin is crawling, and legs twitches"  . Erythromycin Diarrhea  . Nsaids Nausea And Vomiting  . Nyquil [Pseudoeph-Doxylamine-Dm-Apap] Itching  . Tramadol Hcl Other (See Comments)    stomach pain, hallucinations  . Flexeril [Cyclobenzaprine] Anxiety  . Pseudoephedrine Palpitations    Patient Measurements: Height: '5\' 5"'$  (165.1 cm) Weight: 188 lb (85.276 kg) IBW/kg (Calculated) : 57   Vital Signs: Temp: 99 F (37.2 C) (05/10 1005) Temp Source: Oral (05/10 1005) BP: 142/104 mmHg (05/10 1245) Pulse Rate: 101 (05/10 1245) Intake/Output from previous day:   Intake/Output from this shift:    Labs:  Recent Labs  06/19/14 1032  WBC 6.3  HGB 10.7*  PLT 227  CREATININE 0.57   Estimated Creatinine Clearance: 73.6 mL/min (by C-G formula based on Cr of 0.57). No results for input(s): VANCOTROUGH, VANCOPEAK, VANCORANDOM, GENTTROUGH, GENTPEAK, GENTRANDOM, TOBRATROUGH, TOBRAPEAK, TOBRARND, AMIKACINPEAK, AMIKACINTROU, AMIKACIN in the last 72 hours.   Microbiology: No results found for this or any previous visit (from the past 720 hour(s)).  Medical History: Past Medical History  Diagnosis Date  . Atrial fibrillation   . Hypertension   . Insomnia   . Nonischemic cardiomyopathy   . Hyperlipidemia   . Anxiety   . Cholelithiasis   . Insomnia   . Mitral regurgitation     noted 2010  . H/O epistaxis   . Rhinitis, allergic   . CHF (congestive heart failure)   . Pneumonia     "several times w/exacerbations of the COPD; nothing in the last year" (07/15/2012)  . COPD (chronic obstructive pulmonary disease)     as of 7/13 on 2-3L, pfts 10/2008 with mod obstruction  . Chronic bronchitis with COPD (chronic obstructive pulmonary disease)   . Shortness of breath    "all the time right now" (07/15/2012)  . GERD (gastroesophageal reflux disease)   . VOPFYTWK(462.8)     "weekly" (07/15/2012)  . Migraines     "weekly for awhile; cleared up as I got older" (07/15/2012)  . DJD (degenerative joint disease)   . Arthritis     "all over" (07/15/2012)  . OCD (obsessive compulsive disorder)   . OCD (obsessive compulsive disorder)   . Depression     h/o SI; "last time I was really serious about it was ~ 1997" (07/15/2012)    Assessment: 68 YOF admitted 5/10 with SOB x 24h, taking inhalers without significant relief. To start LVQ for COPD exacerbation. WBC 6.3, afeb right now. Methylpred started.  SCr 0.57, CrCl ~70-63m/min  Goal of Therapy:  eradication of infection  Plan:  -LVQ '750mg'$  PO q24h -follow clinical progression, renal function, LOT  Trev Boley D. Latwan Luchsinger, PharmD, BCPS Clinical Pharmacist Pager: [phone]/11/2014 2:42 PM

## 2014-06-19 NOTE — H&P (Signed)
[facility] History and Physical                                                                                    Kelsey Garcia, is a 69 y.o. female  MRN: [id number]   DOB - 07-09-1945  Admit Date - 06/19/2014  Outpatient Primary MD for the patient is No PCP Per Patient Referring ED MD Post  With History of -  Past Medical History  Diagnosis Date  . Atrial fibrillation   . Hypertension   . Insomnia   . Nonischemic cardiomyopathy   . Hyperlipidemia   . Anxiety   . Cholelithiasis   . Insomnia   . Mitral regurgitation     noted 2010  . H/O epistaxis   . Rhinitis, allergic   . CHF (congestive heart failure)   . Pneumonia     "several times w/exacerbations of the COPD; nothing in the last year" (07/15/2012)  . COPD (chronic obstructive pulmonary disease)     as of 7/13 on 2-3L, pfts 10/2008 with mod obstruction  . Chronic bronchitis with COPD (chronic obstructive pulmonary disease)   . Shortness of breath     "all the time right now" (07/15/2012)  . GERD (gastroesophageal reflux disease)   . OVANVBTY(606.0)     "weekly" (07/15/2012)  . Migraines     "weekly for awhile; cleared up as I got older" (07/15/2012)  . DJD (degenerative joint disease)   . Arthritis     "all over" (07/15/2012)  . OCD (obsessive compulsive disorder)   . OCD (obsessive compulsive disorder)   . Depression     h/o SI; "last time I was really serious about it was ~ 1997" (07/15/2012)      Past Surgical History  Procedure Laterality Date  . Tubal ligation  1972  . Cardioversion  2003; 07/2003    in for   Chief Complaint  Patient presents with  . Respiratory Distress     HPI  Kelsey Garcia  is a 69 y.o. female  With pmh GERD, OCD, osteoarthritis, hypertension, hyperlipidemia, history of diastolic CHF, atrial fibrillation, COPD GOLD Stage III who presented with shortness of breath. She was hospitalized 3/31 through 4/12 with COPD exacerbation and influenza and discharged to [facility] . She was discharged home  from SNF '3-4' days ago and states she was feeling well at that time. Called EMS today after experiencing sob. Per EMS reports O2 sat was 78% on 2L on their arrival. Pt received nebulizer treatment and IV solumedrol enroute to ED and subsequent neb in ED. She does admit to smoking cigarettes since d/c from SNF and her daughter also smokes in the home. There is also appears pt is not taking her Lasix since discharge on 05/22/14. It was not listed on her discharge medications.    Review of Systems   In addition to the HPI above,  No Fever-chills, No Headache, No changes with Vision or hearing, No problems swallowing food or Liquids, No Chest pain, Cough or Shortness of Breath, No Abdominal pain, No Nausea or Vomiting, Bowel movements are regular, No Blood in stool or Urine, No dysuria, No new skin rashes or bruises, No  new joints pains-aches,  No new weakness, tingling, numbness in any extremity, No recent weight gain or loss, A full 10 point Review of Systems was done, except as stated above, all other Review of Systems were negative.  Social History History  Substance Use Topics  . Smoking status: Current Every Day Smoker -- 1.00 packs/day for 35 years    Types: Cigarettes  . Smokeless tobacco: Never Used     Comment: No desire to quit at this time./ 1PPD  . Alcohol Use: No  Lives with her daughter and granddaughter.   Family History Family History  Problem Relation Age of Onset  . Asthma    . Emphysema    . Allergies    . Cancer      aunt had several types of cancer    Prior to Admission medications   Medication Sig Start Date End Date Taking? Authorizing Provider  albuterol (PROVENTIL HFA;VENTOLIN HFA) 108 (90 BASE) MCG/ACT inhaler Inhale 1-2 puffs into the lungs every 6 (six) hours as needed for wheezing. 04/28/14   Tanna Furry, MD  aspirin 325 MG tablet Take 650 mg by mouth daily.     Historical Provider, MD  budesonide-formoterol (SYMBICORT) 160-4.5 MCG/ACT inhaler Inhale  2 puffs into the lungs 2 (two) times daily. 06/05/13   Annia Belt, MD  busPIRone (BUSPAR) 10 MG tablet Take 10 mg by mouth 3 (three) times daily. 04/22/14   Historical Provider, MD  chlorpheniramine (CHLOR-TRIMETON) 4 MG tablet Take 1 tablet (4 mg total) by mouth every 8 (eight) hours as needed for allergies. 11/13/13   Dellia Nims, MD  diazepam (VALIUM) 2 MG tablet Take 1 tablet (2 mg total) by mouth every 12 (twelve) hours as needed for anxiety. 12/06/12   Wilber Oliphant, MD  diltiazem (TIAZAC) 420 MG 24 hr capsule Take 1 capsule (420 mg total) by mouth daily. Patient not taking: Reported on 06/15/2014 05/09/14 08/08/15  Bartholomew Crews, MD  FLUoxetine (PROZAC) 20 MG capsule TAKE THREE CAPSULES BY MOUTH ONCE DAILY Patient taking differently: Take 60 mg by mouth. TAKE THREE CAPSULES BY MOUTH ONCE DAILY 01/03/14   Dellia Nims, MD  guaiFENesin (MUCINEX) 600 MG 12 hr tablet Take 1 tablet (600 mg total) by mouth 2 (two) times daily. 04/28/14   Tanna Furry, MD  HYDROcodone-acetaminophen (NORCO) 7.5-325 MG per tablet Take 1-2 tablets by mouth every 6 (six) hours as needed for moderate pain. 06/15/14 07/10/15  Tiffany Daneil Dan, PA-C  HYDROcodone-homatropine (HYCODAN) 5-1.5 MG/5ML syrup Take 5 mLs by mouth every 6 (six) hours as needed for cough. 04/30/14   Bartholomew Crews, MD  nystatin (MYCOSTATIN) 100000 UNIT/ML suspension Take 5 mLs (500,000 Units total) by mouth as needed. Patient taking differently: Take 500,000 Units by mouth as needed (thrush).  04/16/14   Tasrif Ahmed, MD  pantoprazole (PROTONIX) 40 MG tablet TAKE ONE TABLET BY MOUTH TWICE DAILY 01/03/14   Dellia Nims, MD  tiotropium (SPIRIVA) 18 MCG inhalation capsule Place 1 capsule (18 mcg total) into inhaler and inhale daily. 07/05/13   Pollie Friar, MD  zolpidem (AMBIEN) 5 MG tablet Take 1 tablet (5 mg total) by mouth at bedtime as needed for sleep. 03/09/14   Dellia Nims, MD    Allergies  Allergen Reactions  . Ace Inhibitors Cough  .  Diphenhydramine Hcl Other (See Comments)    "feels like my skin is crawling, and legs twitches"  . Erythromycin Diarrhea  . Nsaids Nausea And Vomiting  . Nyquil [Pseudoeph-Doxylamine-Dm-Apap] Itching  .  Tramadol Hcl Other (See Comments)    stomach pain, hallucinations  . Flexeril [Cyclobenzaprine] Anxiety  . Pseudoephedrine Palpitations    Physical Exam  Vitals  Blood pressure 142/104, pulse 101, temperature 99 F (37.2 C), temperature source Oral, resp. rate 20, height '5\' 5"'$  (1.651 m), weight 85.276 kg (188 lb), SpO2 97 %.   General:  Sitting upright in bed, awake and alert in NAD   Psych:  Normal affect and insight, Not Suicidal or Homicidal, Awake Alert, Oriented X 3.  Neuro:   No F.N deficits, ALL C.Nerves Intact, Strength 5/5 all 4 extremities, Sensation intact all 4 extremities.  ENT:  Ears and Eyes appear Normal, Conjunctivae clear, PER. Moist oral mucosa without erythema or exudates.  Neck:  Supple, No lymphadenopathy appreciated  Respiratory:  Symmetrical chest wall movement, Scattered rhonchi and diminished lung sounds bilateral lower lobes. No wheeze. No increased wob  Cardiac: irregular. No Murmurs, no LE edema noted, no JVD.    Abdomen:  Positive bowel sounds, Soft, Non tender, Non distended,  No masses appreciated  Skin:  No Cyanosis, Normal Skin Turgor, No Skin Rash or Bruise.  Extremities:  Able to move all 4. 5/5 strength in each,  no effusions.  Data Review  CBC  Recent Labs Lab 06/19/14 1032  WBC 6.3  HGB 10.7*  HCT 34.4*  PLT 227  MCV 100.3*  MCH 31.2  MCHC 31.1  RDW 14.8  LYMPHSABS 1.0  MONOABS 0.5  EOSABS 0.0  BASOSABS 0.0    Chemistries   Recent Labs Lab 06/15/14 1008 06/19/14 1032  NA 144 139  K 4.6 3.8  CL 105 103  CO2 27 29  GLUCOSE 89 120*  BUN 8 10  CREATININE 0.71 0.57  CALCIUM 9.0 8.9    estimated creatinine clearance is 73.6 mL/min (by C-G formula based on Cr of 0.57).  No results for input(s): TSH, T4TOTAL,  T3FREE, THYROIDAB in the last 72 hours.  Invalid input(s): FREET3  Coagulation profile No results for input(s): INR, PROTIME in the last 168 hours.  No results for input(s): DDIMER in the last 72 hours.  Cardiac Enzymes No results for input(s): CKMB, TROPONINI, MYOGLOBIN in the last 168 hours.  Invalid input(s): CK  Invalid input(s): POCBNP  Urinalysis    Component Value Date/Time   COLORURINE YELLOW 05/10/2014 2037   APPEARANCEUR CLOUDY* 05/10/2014 2037   LABSPEC 1.014 05/10/2014 2037   PHURINE 7.5 05/10/2014 2037   GLUCOSEU NEGATIVE 05/10/2014 2037   HGBUR NEGATIVE 05/10/2014 2037   [place] NEGATIVE 05/10/2014 2037   [place] NEGATIVE 05/10/2014 2037   PROTEINUR NEGATIVE 05/10/2014 2037   UROBILINOGEN 1.0 05/10/2014 2037   NITRITE NEGATIVE 05/10/2014 2037   LEUKOCYTESUR NEGATIVE 05/10/2014 2037    Imaging results:   Dg Chest 2 View  06/19/2014   CLINICAL DATA:  69 year old female with shortness of breath, cough, respiratory distress. Initial encounter.  EXAM: CHEST  2 VIEW  COMPARISON:  06/04/2014 and earlier.  FINDINGS: Improved ventilation since early April when right upper lung consolidation was noted. Residual reticulonodular opacity throughout both lungs, acute on chronic. No superimposed pneumothorax. There are small pleural effusions which appear increased. Stable cardiac size and mediastinal contours. Visualized tracheal air column is within normal limits. No acute osseous abnormality identified.  IMPRESSION: Small bilateral pleural effusions appear increased since April. Otherwise stable ventilation with residual acute on chronic interstitial opacity.  Airspace consolidation in the right upper lung seen in early April has resolved.   Electronically Signed   By:  Genevie Ann M.D.   On: 06/19/2014 11:15   Dg Chest 2 View  06/04/2014   CLINICAL DATA:  Pneumonia.  Cough and congestion P  EXAM: CHEST  2 VIEW  COMPARISON:  CT 05/20/2014.  Chest x-ray 05/19/2014.  FINDINGS:  Mediastinum and hilar structures normal. Cardiomegaly with normal pulmonary vascularity. Partial clearing of nodular interstitial infiltrate. Persistent infiltrate, particular the right upper lobe remains. Continued follow-up chest x-rays demonstrate clearing suggested. Heart size is stable. No pleural effusion or pneumothorax. No acute bony abnormality .  IMPRESSION: Partial clearing of nodular interstitial infiltrate.   Electronically Signed   By: Marcello Moores  Register   On: 06/04/2014 11:46    My personal review of EKG: afib at 98 bpm. No acute findings Assessment & Plan  Active Problems:   Essential hypertension   Anxiety   GERD (gastroesophageal reflux disease)   Chronic diastolic CHF (congestive heart failure)   COPD exacerbation   Atrial fibrillation  Acute on chronic respiratory failure -suspect multifactorial in nature given acute COPD exacerbation +/- mild volume overload -abg stable -no evidence of infectious etiology as pt is afebrile, nontoxic appearing with normal WBC count  Acute COPD exacerbation, O2 dep COPD -steroid taper, nebulizer treatments, empiric Levaquin -on 2L home O2  Chronic diastolic HF -Echo done 4/65/03 shows EF 50-55%.  -Perhaps some mild volume on admit (weight is up 85kg<--82kg) and for some reason her Lasix was not resumed at recent discharge. Will start '80mg'$  bid -I/O's, daily weights, check BNP  Atrial fibrillation  -Rate stable. Monitor on telemetry -Uncertain whether pt is compliant with her diltiazem, but needs to be continued -She refuses chronic anticoagulation (Mali score 4)  Anxiety/OCD Continue home buspar and valium per home regimen. Will continue Prozac '60mg'$  (3 '20mg'$  tabs) daily as taking last admit and at SNF. Pt states she is taking '180mg'$  (3 '60mg'$  tabs)  DVT Prophylaxis SQ heparin  Family Communication:    None available at time of admit  Code Status:   Full code  Condition:   stable  Time spent in minutes : [place], NP on 06/19/2014 at 1:08 PM Between 7am to 7pm - Pager - [phone] After 7pm go to [email or web address] - password [email or web address]  And look for the night coverage person covering me after hours  [facility]

## 2014-06-19 NOTE — ED Notes (Signed)
Report given to 6E , to [facility] RN

## 2014-06-19 NOTE — ED Notes (Signed)
Assisted pt up to [facility] and she stated "it just takes so much out of me".

## 2014-06-19 NOTE — ED Notes (Signed)
Per [facility], pt from home and started having trouble breathing last night around 2100. Lives with her daughter. On 2 liters at home. Was hypoxic on arrival with EMS and daughter told them she had been confused. O2 sats were 76 on the 2 liters. Placed on NRB and sats came up to 98 %. Pt is alert and oriented at this time. 22 g to [place]. In Afib with hx of same. Given 1 alb treatment.

## 2014-06-20 DIAGNOSIS — J9621 Acute and chronic respiratory failure with hypoxia: Principal | ICD-10-CM

## 2014-06-20 DIAGNOSIS — K219 Gastro-esophageal reflux disease without esophagitis: Secondary | ICD-10-CM

## 2014-06-20 LAB — BASIC METABOLIC PANEL
ANION GAP: 9 (ref 5–15)
BUN: 9 mg/dL (ref 6–20)
CO2: 29 mmol/L (ref 22–32)
Calcium: 9.3 mg/dL (ref 8.9–10.3)
Chloride: 101 mmol/L (ref 101–111)
Creatinine, Ser: 0.59 mg/dL (ref 0.44–1.00)
GFR calc Af Amer: >60 mL/min (ref >60)
Glucose, Bld: 122 mg/dL — ABNORMAL HIGH (ref 70–99)
POTASSIUM: 4.4 mmol/L (ref 3.5–5.1)
Sodium: 139 mmol/L (ref 135–145)

## 2014-06-20 LAB — TROPONIN I
Troponin I: <0.03 ng/mL (ref <0.031)
Troponin I: <0.03 ng/mL (ref <0.031)

## 2014-06-20 LAB — CBC
HEMATOCRIT: 36.8 % (ref 36.0–46.0)
HEMOGLOBIN: 11.5 g/dL — AB (ref 12.0–15.0)
MCH: 30.8 pg (ref 26.0–34.0)
MCHC: 31.3 g/dL (ref 30.0–36.0)
MCV: 98.7 fL (ref 78.0–100.0)
Platelets: 262 10*3/uL (ref 150–400)
RBC: 3.73 MIL/uL — ABNORMAL LOW (ref 3.87–5.11)
RDW: 14.7 % (ref 11.5–15.5)
WBC: 6.8 10*3/uL (ref 4.0–10.5)

## 2014-06-20 MED ORDER — PANTOPRAZOLE SODIUM 40 MG PO TBEC
40.0000 mg | DELAYED_RELEASE_TABLET | Freq: Two times a day (BID) | ORAL | Status: DC
  Administered 2014-06-21 – 2014-06-22 (×4): 40 mg via ORAL
  Filled 2014-06-20 (×3): qty 1

## 2014-06-20 MED ORDER — TIOTROPIUM BROMIDE MONOHYDRATE 18 MCG IN CAPS
18.0000 ug | ORAL_CAPSULE | Freq: Every day | RESPIRATORY_TRACT | Status: DC
  Administered 2014-06-22: 18 ug via RESPIRATORY_TRACT
  Filled 2014-06-20 (×2): qty 5

## 2014-06-20 NOTE — Progress Notes (Signed)
[facility] PROGRESS NOTE   Kelsey Garcia JWJ:[id number] DOB: [birth year] DOA: 06/19/2014 PCP: No PCP Per Patient  HPI/Subjective: Patient discussed with me for very long time why she left the IM teaching service clinic. Was talking were very long time and pressing on the fact that pain and cough medications are very important to her. Less cough and wheezing than yesterday.  Assessment/Plan: Active Problems:   Essential hypertension   Anxiety   GERD (gastroesophageal reflux disease)   Chronic diastolic CHF (congestive heart failure)   Acute on chronic respiratory failure   COPD exacerbation   Atrial fibrillation   Acute on chronic respiratory failure -Presented with oxygen saturation of 78% on 2 L of oxygen (she is on 2 L of oxygen at home all the time). -Did fairly well in the emergency department currently sats very well on 2 L. -Nontoxic-appearing, denies any fever or chills, per the admitting doctor there is a slight fluid overload.  Acute COPD exacerbation, O2 dep COPD -steroid taper, nebulizer treatments, empiric Levaquin -Back on her home dose of oxygen which is 2 L/seconds per nasal cannula.  Chronic diastolic HF -Echo done 07/30/28 shows EF 50-55%.  -Could be mild fluid overload, weight up about 3 kg, started back on her home dose of Lasix. -BNP slightly elevated at 441.  Atrial fibrillation  -Rate stable. Monitor on telemetry -Uncertain whether pt is compliant with her diltiazem, but needs to be continued -She refuses chronic anticoagulation (Mali score 4)  Anxiety/OCD Continue home buspar and valium per home regimen.  This is probably a very major contributing factor to her episodes.  Code Status: Full Code Family Communication: Plan discussed with the patient. Disposition Plan: Remains inpatient Diet: Diet 2 gram sodium Room service appropriate?: Yes; Fluid consistency::  Thin  Consultants:  None  Procedures:  None  Antibiotics:  Levofloxacin   Objective: Filed Vitals:   06/20/14 0955  BP: 140/85  Pulse: 114  Temp: 98.6 F (37 C)  Resp: 20    Intake/Output Summary (Last 24 hours) at 06/20/14 1548 Last data filed at 06/19/14 2138  Gross per 24 hour  Intake    100 ml  Output      0 ml  Net    100 ml   Filed Weights   06/19/14 1000 06/19/14 2100  Weight: 85.276 kg (188 lb) 85.78 kg (189 lb 1.8 oz)    Exam: General: Alert and awake, oriented x3, not in any acute distress. HEENT: anicteric sclera, pupils reactive to light and accommodation, EOMI CVS: S1-S2 clear, no murmur rubs or gallops Chest: clear to auscultation bilaterally, no wheezing, rales or rhonchi Abdomen: soft nontender, nondistended, normal bowel sounds, no organomegaly Extremities: no cyanosis, clubbing or edema noted bilaterally Neuro: Cranial nerves II-XII intact, no focal neurological deficits  Data Reviewed: Basic Metabolic Panel:  Recent Labs Lab 06/15/14 1008 06/19/14 1032 06/19/14 1745 06/20/14 0442  NA 144 139  --  139  K 4.6 3.8  --  4.4  CL 105 103  --  101  CO2 27 29  --  29  GLUCOSE 89 120*  --  122*  BUN 8 10  --  9  CREATININE 0.71 0.57 0.55 0.59  CALCIUM 9.0 8.9  --  9.3   Liver Function Tests: No results for input(s): AST, ALT, ALKPHOS, BILITOT, PROT, ALBUMIN in the last 168 hours. No results for input(s): LIPASE, AMYLASE in the last 168 hours. No results for input(s): AMMONIA in the last 168 hours. CBC:  Recent Labs Lab 06/19/14 1032 06/19/14 1745 06/20/14 0442  WBC 6.3 5.5 6.8  NEUTROABS 4.8  --   --   HGB 10.7* 10.9* 11.5*  HCT 34.4* 34.7* 36.8  MCV 100.3* 98.6 98.7  PLT 227 241 262   Cardiac Enzymes:  Recent Labs Lab 06/19/14 1745 06/19/14 2308 06/20/14 0442  TROPONINI <0.03 <0.03 <0.03   BNP (last 3 results)  Recent Labs  05/16/14 1055 06/19/14 1032 06/19/14 1745  BNP 145.0* 418.5* 441.4*    ProBNP (last  3 results) No results for input(s): PROBNP in the last 8760 hours.  CBG: No results for input(s): GLUCAP in the last 168 hours.  Micro No results found for this or any previous visit (from the past 240 hour(s)).   Studies: Dg Chest 2 View  06/19/2014   CLINICAL DATA:  69 year old female with shortness of breath, cough, respiratory distress. Initial encounter.  EXAM: CHEST  2 VIEW  COMPARISON:  06/04/2014 and earlier.  FINDINGS: Improved ventilation since early April when right upper lung consolidation was noted. Residual reticulonodular opacity throughout both lungs, acute on chronic. No superimposed pneumothorax. There are small pleural effusions which appear increased. Stable cardiac size and mediastinal contours. Visualized tracheal air column is within normal limits. No acute osseous abnormality identified.  IMPRESSION: Small bilateral pleural effusions appear increased since April. Otherwise stable ventilation with residual acute on chronic interstitial opacity.  Airspace consolidation in the right upper lung seen in early April has resolved.   Electronically Signed   By: Genevie Ann M.D.   On: 06/19/2014 11:15    Scheduled Meds: . aspirin EC  325 mg Oral Daily  . busPIRone  10 mg Oral TID  . diltiazem  420 mg Oral Daily  . FLUoxetine  60 mg Oral Daily  . furosemide  40 mg Oral BID  . heparin  5,000 Units Subcutaneous 3 times per day  . levofloxacin  750 mg Oral Daily  . methylPREDNISolone (SOLU-MEDROL) injection  60 mg Intravenous Q12H  . pantoprazole  40 mg Oral BID  . sodium chloride  3 mL Intravenous Q12H   Continuous Infusions:      Time spent: 35 minutes    [facility] A  [facility] Pager [phone] If 7PM-7AM, please contact night-coverage at [email or web address], password [email or web address] 06/20/2014, 3:48 PM  LOS: 1 day

## 2014-06-21 DIAGNOSIS — F419 Anxiety disorder, unspecified: Secondary | ICD-10-CM

## 2014-06-21 LAB — BASIC METABOLIC PANEL
ANION GAP: 9 (ref 5–15)
BUN: 18 mg/dL (ref 6–20)
CALCIUM: 8.9 mg/dL (ref 8.9–10.3)
CHLORIDE: 97 mmol/L — AB (ref 101–111)
CO2: 33 mmol/L — ABNORMAL HIGH (ref 22–32)
CREATININE: 0.73 mg/dL (ref 0.44–1.00)
GFR calc Af Amer: >60 mL/min (ref >60)
GFR calc non Af Amer: >60 mL/min (ref >60)
GLUCOSE: 145 mg/dL — AB (ref 65–99)
Potassium: 4.1 mmol/L (ref 3.5–5.1)
Sodium: 139 mmol/L (ref 135–145)

## 2014-06-21 MED ORDER — BUDESONIDE-FORMOTEROL FUMARATE 160-4.5 MCG/ACT IN AERO
2.0000 | INHALATION_SPRAY | Freq: Two times a day (BID) | RESPIRATORY_TRACT | Status: DC
  Administered 2014-06-21 – 2014-06-22 (×2): 2 via RESPIRATORY_TRACT
  Filled 2014-06-21 (×2): qty 6

## 2014-06-21 MED ORDER — FUROSEMIDE 10 MG/ML IJ SOLN
40.0000 mg | Freq: Once | INTRAMUSCULAR | Status: AC
  Administered 2014-06-21: 40 mg via INTRAVENOUS
  Filled 2014-06-21: qty 4

## 2014-06-21 MED ORDER — BENZONATATE 100 MG PO CAPS
100.0000 mg | ORAL_CAPSULE | Freq: Three times a day (TID) | ORAL | Status: DC
  Administered 2014-06-21 – 2014-06-22 (×4): 100 mg via ORAL
  Filled 2014-06-21 (×6): qty 1

## 2014-06-21 NOTE — Progress Notes (Signed)
[facility] PROGRESS NOTE   Kelsey Garcia ZMO:[id number] DOB: 03/29/1945 DOA: 06/19/2014 PCP: No PCP Per Patient  HPI/Subjective: Feels better, still complains about shortness of breath and cough. Ask more than once to place her back on narcotic-containing cough syrup.  Assessment/Plan: Active Problems:   Essential hypertension   Anxiety   GERD (gastroesophageal reflux disease)   Chronic diastolic CHF (congestive heart failure)   Acute on chronic respiratory failure   COPD exacerbation   Atrial fibrillation   Acute on chronic respiratory failure -Presented with oxygen saturation of 78% on 2 L of oxygen (she is on 2 L of oxygen at home all the time). -Did fairly well in the emergency department currently sats very well on 2 L. -Nontoxic-appearing, denies any fever or chills, per the admitting doctor there is a slight fluid overload. -Currently on 2 L of oxygen, this is which she uses at home.  Acute COPD exacerbation, O2 dep COPD -Empiric Levaquin, steroid taper and bronchodilators. -Restarted back on her Symbicort and Spiriva, ask for cough medicine, started on Tessalon.  Chronic diastolic HF -Echo done 0/35/46 shows EF 50-55%.  -She has mild fluid overload, weight up about 3 kg, started back on her home dose of Lasix. -BNP slightly elevated at 441 and +1 pedal edema. -Started back on Lasix, asked not to be given Lasix at night so she can sleep. Lasix 40 mg given around 2 PM.  Atrial fibrillation  -Rate stable. Monitor on telemetry -Uncertain whether pt is compliant with her diltiazem, but needs to be continued -She refuses chronic anticoagulation (CHA2DS2-VASc of 4, age/gender/CHF/HTN)  Anxiety/OCD Continue home buspar and valium per home regimen.  This is probably a very major contributing factor to her episodes.  Code Status: Full Code Family Communication: Plan discussed with the patient. Disposition Plan: Remains inpatient Diet: Diet 2 gram sodium Room  service appropriate?: Yes; Fluid consistency:: Thin  Consultants:  None  Procedures:  None  Antibiotics:  Levofloxacin   Objective: Filed Vitals:   06/21/14 0849  BP: 136/70  Pulse: 100  Temp: 97.6 F (36.4 C)  Resp: 22    Intake/Output Summary (Last 24 hours) at 06/21/14 1532 Last data filed at 06/21/14 0854  Gross per 24 hour  Intake    840 ml  Output      0 ml  Net    840 ml   Filed Weights   06/19/14 1000 06/19/14 2100 06/20/14 2053  Weight: 85.276 kg (188 lb) 85.78 kg (189 lb 1.8 oz) 84.823 kg (187 lb)    Exam: General: Alert and awake, oriented x3, not in any acute distress. HEENT: anicteric sclera, pupils reactive to light and accommodation, EOMI CVS: S1-S2 clear, no murmur rubs or gallops Chest: clear to auscultation bilaterally, no wheezing, rales or rhonchi Abdomen: soft nontender, nondistended, normal bowel sounds, no organomegaly Extremities: no cyanosis, clubbing or edema noted bilaterally Neuro: Cranial nerves II-XII intact, no focal neurological deficits  Data Reviewed: Basic Metabolic Panel:  Recent Labs Lab 06/15/14 1008 06/19/14 1032 06/19/14 1745 06/20/14 0442 06/21/14 0600  NA 144 139  --  139 139  K 4.6 3.8  --  4.4 4.1  CL 105 103  --  101 97*  CO2 27 29  --  29 33*  GLUCOSE 89 120*  --  122* 145*  BUN 8 10  --  9 18  CREATININE 0.71 0.57 0.55 0.59 0.73  CALCIUM 9.0 8.9  --  9.3 8.9   Liver Function Tests: No results  for input(s): AST, ALT, ALKPHOS, BILITOT, PROT, ALBUMIN in the last 168 hours. No results for input(s): LIPASE, AMYLASE in the last 168 hours. No results for input(s): AMMONIA in the last 168 hours. CBC:  Recent Labs Lab 06/19/14 1032 06/19/14 1745 06/20/14 0442  WBC 6.3 5.5 6.8  NEUTROABS 4.8  --   --   HGB 10.7* 10.9* 11.5*  HCT 34.4* 34.7* 36.8  MCV 100.3* 98.6 98.7  PLT 227 241 262   Cardiac Enzymes:  Recent Labs Lab 06/19/14 1745 06/19/14 2308 06/20/14 0442  TROPONINI <0.03 <0.03 <0.03    BNP (last 3 results)  Recent Labs  05/16/14 1055 06/19/14 1032 06/19/14 1745  BNP 145.0* 418.5* 441.4*    ProBNP (last 3 results) No results for input(s): PROBNP in the last 8760 hours.  CBG: No results for input(s): GLUCAP in the last 168 hours.  Micro No results found for this or any previous visit (from the past 240 hour(s)).   Studies: No results found.  Scheduled Meds: . aspirin EC  325 mg Oral Daily  . benzonatate  100 mg Oral TID  . budesonide-formoterol  2 puff Inhalation BID  . busPIRone  10 mg Oral TID  . diltiazem  420 mg Oral Daily  . FLUoxetine  60 mg Oral Daily  . heparin  5,000 Units Subcutaneous 3 times per day  . levofloxacin  750 mg Oral Daily  . methylPREDNISolone (SOLU-MEDROL) injection  60 mg Intravenous Q12H  . pantoprazole  40 mg Oral BID AC  . sodium chloride  3 mL Intravenous Q12H  . tiotropium  18 mcg Inhalation Daily   Continuous Infusions:      Time spent: 35 minutes    [facility] A  [facility] Pager [phone] If 7PM-7AM, please contact night-coverage at [email or web address], password [facility] 06/21/2014, 3:32 PM  LOS: 2 days

## 2014-06-22 ENCOUNTER — Telehealth: Payer: Self-pay | Admitting: *Deleted

## 2014-06-22 LAB — BASIC METABOLIC PANEL
Anion gap: 8 (ref 5–15)
BUN: 19 mg/dL (ref 6–20)
CO2: 35 mmol/L — ABNORMAL HIGH (ref 22–32)
Calcium: 8.4 mg/dL — ABNORMAL LOW (ref 8.9–10.3)
Chloride: 93 mmol/L — ABNORMAL LOW (ref 101–111)
Creatinine, Ser: 0.94 mg/dL (ref 0.44–1.00)
Glucose, Bld: 145 mg/dL — ABNORMAL HIGH (ref 65–99)
Potassium: 3.7 mmol/L (ref 3.5–5.1)
Sodium: 136 mmol/L (ref 135–145)

## 2014-06-22 MED ORDER — HYDROCODONE-ACETAMINOPHEN 7.5-325 MG PO TABS: 1.0000 | ORAL_TABLET | Freq: Four times a day (QID) | ORAL | Status: DC | PRN

## 2014-06-22 MED ORDER — BENZONATATE 100 MG PO CAPS: 100.0000 mg | ORAL_CAPSULE | Freq: Three times a day (TID) | ORAL | Status: DC | PRN

## 2014-06-22 MED ORDER — BIOTENE DRY MOUTH MT LIQD: 15.0000 mL | OROMUCOSAL | Status: DC | PRN

## 2014-06-22 MED ORDER — BIOTENE DRY MOUTH MT LIQD
15.0000 mL | OROMUCOSAL | Status: DC | PRN
Start: 2014-06-22 — End: 2014-06-22

## 2014-06-22 MED ORDER — PREDNISONE 10 MG (21) PO TBPK: ORAL_TABLET | ORAL | Status: DC

## 2014-06-22 MED ORDER — LEVOFLOXACIN 750 MG PO TABS: 750.0000 mg | ORAL_TABLET | Freq: Every day | ORAL | Status: DC

## 2014-06-22 NOTE — Care Management Note (Signed)
Case Management Note  Patient Details  Name: Kelsey Garcia MRN: [id number] Date of Birth: 06-Aug-1945  Subjective/Objective:               CM following for progression and d/c plann Action/Plan:   CM met with pt 06/22/14 IM given and explained, HH resumed with [name] per pt request. [name] notified.          Expected Discharge Date:           06/22/2014       Expected Discharge Plan:  [place]  In-House Referral:     Discharge planning Services  CM Consult  Post Acute Care Choice:    Choice offered to:  Patient  DME Arranged:    DME Agency:     HH Arranged:  RN, PT HH Agency:  [facility]  Status of Service:  Completed, signed off  Medicare Important Message Given:  Yes Date Medicare IM Given:  06/22/14 Medicare IM give by:  Jasmine Pang RN MPH, case manager, [phone] Date Additional Medicare IM Given:    Additional Medicare Important Message give by:     If discussed at [place] of Stay Meetings, dates discussed:    Additional Comments:  Adron Bene, RN 06/22/2014, 2:48 PM

## 2014-06-22 NOTE — Consult Note (Signed)
   [name] CM Inpatient Consult   06/22/2014  Kelsey Garcia 1945/04/25 [id number]   Patient evaluated for [facility] services. Discussed with patient at bedside in detail about [facility] services for COPD management and education. Patient endorses that she does not need additional information and education regarding COPD and CHF. States she has been managing for years. Reports she thinks the flu and pneumonia was the culprit of this admission. Endorses she was at [facility] recently and was discharged home. States she is active with [facility] and she feels that is enough. Reports she uses [facility] [employer] to get to MD appointments. States she has 3 dollar copays for her medications and is able obtain her meds.. Has Medicaid as secondary. Also states she is supposed to follow up with [facility] for Primary Care MD services. Patient declines [facility] services. Made inpatient RNCM aware.   [name], MSN-Ed, RN,BSN [facility] Liaison [phone]

## 2014-06-22 NOTE — Discharge Summary (Signed)
Physician Discharge Summary  Kelsey Garcia ATF:[id number] DOB: Nov 17, 1945 DOA: 06/19/2014  PCP: No PCP Per Patient  Admit date: 06/19/2014 Discharge date: 06/22/2014  Time spent: 40 minutes  Recommendations for Outpatient Follow-up:  1. Follow with primary care physician within one week.  Discharge Diagnoses:  Active Problems:   Essential hypertension   Anxiety   GERD (gastroesophageal reflux disease)   Chronic diastolic CHF (congestive heart failure)   Acute on chronic respiratory failure   COPD exacerbation   Atrial fibrillation   Discharge Condition: Stable  Diet recommendation: Heart healthy  Filed Weights   06/19/14 2100 06/20/14 2053 06/21/14 2058  Weight: 85.78 kg (189 lb 1.8 oz) 84.823 kg (187 lb) 85.27 kg (187 lb 15.8 oz)    History of present illness:  Kelsey Garcia is a 69 y.o. female With pmh GERD, OCD, osteoarthritis, hypertension, hyperlipidemia, history of diastolic CHF, atrial fibrillation, COPD GOLD Stage III who presented with shortness of breath. She was hospitalized 3/31 through 4/12 with COPD exacerbation and influenza and [facility] . She was discharged home from SNF '3-4' days PTA and states she was feeling well at that time. Called EMS today after experiencing sob. Per EMS reports O2 sat was 78% on 2L on their arrival. Pt received nebulizer treatment and IV solumedrol enroute to ED and subsequent neb in ED. She does admit to smoking cigarettes since d/c from SNF and her daughter also smokes in the home. There is also appears pt is not taking her Lasix since discharge on 05/22/14. It was not listed on her discharge medications.   Hospital Course:   Acute on chronic respiratory failure -Presented with oxygen saturation of 78% on 2 L of oxygen (she is on 2 L of oxygen at home all the time). -Did fairly well in the emergency department currently sats very well on 2 L. -Nontoxic-appearing, denies any fever or chills, per the admitting doctor there is a  slight fluid overload. -On 2 L for the past 3 days, no issues with hypoxia, she is back to baseline.  Acute COPD exacerbation, O2 dep COPD -We did empirically because of the hypoxia, but no impressive wheezing or change in sputum color -Restarted back on her Symbicort and Spiriva, ask for cough medicine, started on Tessalon. -Discharged on Levaquin, steroid taper and her home bronchodilators.  Chronic diastolic HF -Echo done 2/70/62 shows EF 50-55%.  -She has mild fluid overload, weight up about 3 kg, started back on her home dose of Lasix. -BNP slightly elevated at 441 and +1 pedal edema. -Given extra doses of IV Lasix while she was in the hospital, restarted on her home dose of Lasix on the time of discharge.  Atrial fibrillation  -Rate stable. Monitor on telemetry -Uncertain whether pt is compliant with her diltiazem, but needs to be continued -She refuses chronic anticoagulation (CHA2DS2-VASc of 4, age/gender/CHF/HTN)  Anxiety/OCD Continue home buspar and valium per home regimen.  This is probably a very major contributing factor to her episodes. On the day of discharge patient was telling me and the RN that she is "afraid" she is not ready to go home, I explained to her that she's been on 2 L of oxygen since admission, and this is probably more anxiety. Explained to her all of her medications here her pain oral meds since admission, she will likely to do okay.   Procedures:  None  Consultations:  None  Discharge Exam: Filed Vitals:   06/22/14 0905  BP: 130/72  Pulse: 107  Temp: 98.2 F (36.8 C)  Resp: 20   General: Alert and awake, oriented x3, not in any acute distress. HEENT: anicteric sclera, pupils reactive to light and accommodation, EOMI CVS: S1-S2 clear, no murmur rubs or gallops Chest: clear to auscultation bilaterally, no wheezing, rales or rhonchi Abdomen: soft nontender, nondistended, normal bowel sounds, no organomegaly Extremities: no cyanosis,  clubbing or edema noted bilaterally Neuro: Cranial nerves II-XII intact, no focal neurological deficits  Discharge Instructions   Discharge Instructions    Diet - low sodium heart healthy    Complete by:  As directed      Increase activity slowly    Complete by:  As directed           Current Discharge Medication List    START taking these medications   Details  antiseptic oral rinse (BIOTENE) LIQD 15 mLs by Mouth Rinse route as needed for dry mouth. Qty: 473 mL, Refills: 0    benzonatate (TESSALON) 100 MG capsule Take 1 capsule (100 mg total) by mouth 3 (three) times daily as needed for cough. Qty: 30 capsule, Refills: 0    levofloxacin (LEVAQUIN) 750 MG tablet Take 1 tablet (750 mg total) by mouth daily. Qty: 5 tablet, Refills: 0    predniSONE (STERAPRED UNI-PAK 21 TAB) 10 MG (21) TBPK tablet Take 6-5-4-3-2-1 tablet PO daily till gone Qty: 21 tablet, Refills: 0      CONTINUE these medications which have CHANGED   Details  HYDROcodone-acetaminophen (NORCO) 7.5-325 MG per tablet Take 1-2 tablets by mouth every 6 (six) hours as needed for moderate pain. Qty: 20 tablet, Refills: 0   Associated Diagnoses: Primary osteoarthritis of hip, unspecified laterality      CONTINUE these medications which have NOT CHANGED   Details  albuterol (PROVENTIL HFA;VENTOLIN HFA) 108 (90 BASE) MCG/ACT inhaler Inhale 1-2 puffs into the lungs every 6 (six) hours as needed for wheezing. Qty: 1 Inhaler, Refills: 0    aspirin 325 MG tablet Take 650 mg by mouth daily.     budesonide-formoterol (SYMBICORT) 160-4.5 MCG/ACT inhaler Inhale 2 puffs into the lungs 2 (two) times daily. Qty: 10.2 g, Refills: 11    busPIRone (BUSPAR) 10 MG tablet Take 10 mg by mouth 3 (three) times daily.    chlorpheniramine (CHLOR-TRIMETON) 4 MG tablet Take 1 tablet (4 mg total) by mouth every 8 (eight) hours as needed for allergies. Qty: 90 tablet, Refills: 0   Associated Diagnoses: Chronic cough    diazepam  (VALIUM) 2 MG tablet Take 1 tablet (2 mg total) by mouth every 12 (twelve) hours as needed for anxiety. Qty: 60 tablet, Refills: 3   Associated Diagnoses: Anxiety    FLUoxetine (PROZAC) 20 MG capsule TAKE THREE CAPSULES BY MOUTH ONCE DAILY Qty: 90 capsule, Refills: 5    HYDROcodone-homatropine (HYCODAN) 5-1.5 MG/5ML syrup Take 5 mLs by mouth every 6 (six) hours as needed for cough. Qty: 120 mL, Refills: 0   Associated Diagnoses: Chronic cough    hydroxypropyl methylcellulose / hypromellose (ISOPTO TEARS / GONIOVISC) 2.5 % ophthalmic solution Place 1 drop into both eyes as needed for dry eyes.    pantoprazole (PROTONIX) 40 MG tablet TAKE ONE TABLET BY MOUTH TWICE DAILY Qty: 60 tablet, Refills: 5    tiotropium (SPIRIVA) 18 MCG inhalation capsule Place 1 capsule (18 mcg total) into inhaler and inhale daily. Qty: 90 capsule, Refills: 4   Associated Diagnoses: COPD (chronic obstructive pulmonary disease)    zolpidem (AMBIEN) 5 MG tablet Take 1 tablet (5  mg total) by mouth at bedtime as needed for sleep. Qty: 30 tablet, Refills: 3    diltiazem (TIAZAC) 420 MG 24 hr capsule Take 1 capsule (420 mg total) by mouth daily. Qty: 90 capsule, Refills: 0   Associated Diagnoses: Essential hypertension    guaiFENesin (MUCINEX) 600 MG 12 hr tablet Take 1 tablet (600 mg total) by mouth 2 (two) times daily. Qty: 20 tablet, Refills: 0    nystatin (MYCOSTATIN) 100000 UNIT/ML suspension Take 5 mLs (500,000 Units total) by mouth as needed. Qty: 60 mL, Refills: 0       Allergies  Allergen Reactions  . Ace Inhibitors Cough  . Diphenhydramine Hcl Other (See Comments)    "feels like my skin is crawling, and legs twitches"  . Erythromycin Diarrhea  . Nsaids Nausea And Vomiting  . Nyquil [Pseudoeph-Doxylamine-Dm-Apap] Itching  . Tramadol Hcl Other (See Comments)    stomach pain, hallucinations  . Flexeril [Cyclobenzaprine] Anxiety  . Pseudoephedrine Palpitations   Follow-up Information    Follow  up with [facility]     In 1 week.   Contact information:   [address] [place] [address] [phone]       The results of significant diagnostics from this hospitalization (including imaging, microbiology, ancillary and laboratory) are listed below for reference.    Significant Diagnostic Studies: Dg Chest 2 View  06/19/2014   CLINICAL DATA:  69 year old female with shortness of breath, cough, respiratory distress. Initial encounter.  EXAM: CHEST  2 VIEW  COMPARISON:  06/04/2014 and earlier.  FINDINGS: Improved ventilation since early April when right upper lung consolidation was noted. Residual reticulonodular opacity throughout both lungs, acute on chronic. No superimposed pneumothorax. There are small pleural effusions which appear increased. Stable cardiac size and mediastinal contours. Visualized tracheal air column is within normal limits. No acute osseous abnormality identified.  IMPRESSION: Small bilateral pleural effusions appear increased since April. Otherwise stable ventilation with residual acute on chronic interstitial opacity.  Airspace consolidation in the right upper lung seen in early April has resolved.   Electronically Signed   By: Genevie Ann M.D.   On: 06/19/2014 11:15   Dg Chest 2 View  06/04/2014   CLINICAL DATA:  Pneumonia.  Cough and congestion P  EXAM: CHEST  2 VIEW  COMPARISON:  CT 05/20/2014.  Chest x-ray 05/19/2014.  FINDINGS: Mediastinum and hilar structures normal. Cardiomegaly with normal pulmonary vascularity. Partial clearing of nodular interstitial infiltrate. Persistent infiltrate, particular the right upper lobe remains. Continued follow-up chest x-rays demonstrate clearing suggested. Heart size is stable. No pleural effusion or pneumothorax. No acute bony abnormality .  IMPRESSION: Partial clearing of nodular interstitial infiltrate.   Electronically Signed   By: Marcello Moores  Register   On: 06/04/2014 11:46     Microbiology: No results found for this or any previous visit (from the past 240 hour(s)).   Labs: Basic Metabolic Panel:  Recent Labs Lab 06/19/14 1032 06/19/14 1745 06/20/14 0442 06/21/14 0600 06/22/14 0452  NA 139  --  139 139 136  K 3.8  --  4.4 4.1 3.7  CL 103  --  101 97* 93*  CO2 29  --  29 33* 35*  GLUCOSE 120*  --  122* 145* 145*  BUN 10  --  '9 18 19  '$ CREATININE 0.57 0.55 0.59 0.73 0.94  CALCIUM 8.9  --  9.3 8.9 8.4*   Liver Function Tests: No results for input(s): AST, ALT, ALKPHOS, BILITOT, PROT,  ALBUMIN in the last 168 hours. No results for input(s): LIPASE, AMYLASE in the last 168 hours. No results for input(s): AMMONIA in the last 168 hours. CBC:  Recent Labs Lab 06/19/14 1032 06/19/14 1745 06/20/14 0442  WBC 6.3 5.5 6.8  NEUTROABS 4.8  --   --   HGB 10.7* 10.9* 11.5*  HCT 34.4* 34.7* 36.8  MCV 100.3* 98.6 98.7  PLT 227 241 262   Cardiac Enzymes:  Recent Labs Lab 06/19/14 1745 06/19/14 2308 06/20/14 0442  TROPONINI <0.03 <0.03 <0.03   BNP: BNP (last 3 results)  Recent Labs  05/16/14 1055 06/19/14 1032 06/19/14 1745  BNP 145.0* 418.5* 441.4*    ProBNP (last 3 results) No results for input(s): PROBNP in the last 8760 hours.  CBG: No results for input(s): GLUCAP in the last 168 hours.     Signed:  Danah Reinecke A  [facility] 06/22/2014, 12:58 PM

## 2014-06-22 NOTE — Clinical Social Work Note (Signed)
Patient discharging home today and is requesting transport. Patient will be transported home by cab and has been provided with a taxi voucher.  Citlally Captain Givens, MSW, LCSW Licensed Clinical Social Worker [place] [phone]

## 2014-06-22 NOTE — Telephone Encounter (Signed)
Patient caregiver called and stated that patient was discharged from [facility] and [facility] Herbert wrote Hydrocodone Rx Wrong. Stated that she wrote Take '1mg'$  every 6 hours instead of 1 tablet every 6 hours and needs corrected. I tried calling them back at the 2 numbers left but unable to leave message. Patient has seen Internal Medicine Provider to establish on 06/15/14 Kelsey Garcia. The medication looks like it was corrected and refilled.

## 2014-06-22 NOTE — Progress Notes (Signed)
[name] to be D/C'd Home per MD order.  Discussed prescriptions and follow up appointments with the patient. Prescriptions given to patient, medication list explained in detail. Pt verbalized understanding.    Medication List    TAKE these medications        albuterol 108 (90 BASE) MCG/ACT inhaler  Commonly known as:  PROVENTIL HFA;VENTOLIN HFA  Inhale 1-2 puffs into the lungs every 6 (six) hours as needed for wheezing.     antiseptic oral rinse Liqd  15 mLs by Mouth Rinse route as needed for dry mouth.     aspirin 325 MG tablet  Take 650 mg by mouth daily.     benzonatate 100 MG capsule  Commonly known as:  TESSALON  Take 1 capsule (100 mg total) by mouth 3 (three) times daily as needed for cough.     budesonide-formoterol 160-4.5 MCG/ACT inhaler  Commonly known as:  SYMBICORT  Inhale 2 puffs into the lungs 2 (two) times daily.     busPIRone 10 MG tablet  Commonly known as:  BUSPAR  Take 10 mg by mouth 3 (three) times daily.     chlorpheniramine 4 MG tablet  Commonly known as:  CHLOR-TRIMETON  Take 1 tablet (4 mg total) by mouth every 8 (eight) hours as needed for allergies.     diazepam 2 MG tablet  Commonly known as:  VALIUM  Take 1 tablet (2 mg total) by mouth every 12 (twelve) hours as needed for anxiety.     diltiazem 420 MG 24 hr capsule  Commonly known as:  TIAZAC  Take 1 capsule (420 mg total) by mouth daily.     FLUoxetine 20 MG capsule  Commonly known as:  PROZAC  TAKE THREE CAPSULES BY MOUTH ONCE DAILY     guaiFENesin 600 MG 12 hr tablet  Commonly known as:  MUCINEX  Take 1 tablet (600 mg total) by mouth 2 (two) times daily.     HYDROcodone-acetaminophen 7.5-325 MG per tablet  Commonly known as:  NORCO  Take 1-2 tablets by mouth every 6 (six) hours as needed for moderate pain.     HYDROcodone-homatropine 5-1.5 MG/5ML syrup  Commonly known as:  HYCODAN  Take 5 mLs by mouth every 6 (six) hours as needed for cough.     hydroxypropyl  methylcellulose / hypromellose 2.5 % ophthalmic solution  Commonly known as:  ISOPTO TEARS / GONIOVISC  Place 1 drop into both eyes as needed for dry eyes.     levofloxacin 750 MG tablet  Commonly known as:  LEVAQUIN  Take 1 tablet (750 mg total) by mouth daily.     nystatin 100000 UNIT/ML suspension  Commonly known as:  MYCOSTATIN  Take 5 mLs (500,000 Units total) by mouth as needed.     pantoprazole 40 MG tablet  Commonly known as:  PROTONIX  TAKE ONE TABLET BY MOUTH TWICE DAILY     predniSONE 10 MG (21) Tbpk tablet  Commonly known as:  STERAPRED UNI-PAK 21 TAB  Take 6-5-4-3-2-1 tablet PO daily till gone     tiotropium 18 MCG inhalation capsule  Commonly known as:  SPIRIVA  Place 1 capsule (18 mcg total) into inhaler and inhale daily.     zolpidem 5 MG tablet  Commonly known as:  AMBIEN  Take 1 tablet (5 mg total) by mouth at bedtime as needed for sleep.        Filed Vitals:   06/22/14 0905  BP: 130/72  Pulse: 107  Temp:  98.2 F (36.8 C)  Resp: 20    Skin clean, dry and intact without evidence of skin break down, no evidence of skin tears noted. IV catheter discontinued intact. Site without signs and symptoms of complications. Dressing and pressure applied. Pt denies pain at this time. No complaints noted.  An After Visit Summary was printed and given to the patient. Patient escorted via [place], and D/C home via Cab (voucher provided via social work)  [employer] A 06/22/2014 6:17 PM

## 2014-06-24 NOTE — Progress Notes (Signed)
Utilization review completed- retro 

## 2014-06-25 ENCOUNTER — Encounter: Payer: Self-pay | Admitting: Adult Health

## 2014-06-25 ENCOUNTER — Ambulatory Visit: Payer: Medicare Other | Admitting: Family Medicine

## 2014-06-25 NOTE — Progress Notes (Signed)
Patient ID: Kelsey Garcia, female   DOB: [birth year], 69 y.o.   MRN: [id number]  She has been discharged from [facility]. On 06-07-14. She has come to this facility upset that she did not receive enough pain pills. The prescription was written for 30 tabs of vicodin 7.5/325 mg. On 06-22-14 she went to the ED and received prescription for 45 tabs. She was told that no more prescriptions could be written for her.

## 2014-06-25 NOTE — Progress Notes (Signed)
Patient ID: Kelsey Garcia, female   DOB: 1945/10/04, 69 y.o.   MRN: [id number]   Kelsey Garcia is a 69 y.o.  female who returns for follow up on CHF,   She is prior patient of Dr. Doreatha Lew  Hospitalized in  09/2010 for AFib. She has a hx of permanent AFib, s/p 2 failed DCCVs, diastolic CHF, COPD, HTN, HL, tobacco abuse, OCD. Echo in 2010 with normal LVF. Anticoagulation recommended  but patient declined (CHADS2-VASc=4). Last echo (07/2012): Normal LV wall thickness and motion, EF 60-65%, mild RAE. She was recently seen in follow up with her PCP and noted to have worsening LE edema. Lasix was increased with improvement according to the notes in her chart.  Patient notes her LE edema is improved since taking Lasix 80 bid. She has been trying to lose weight over the last 1 year (lost 30lbs). Also exercising at home. Denies exertional CP. Notes DOE that is chronic. Seems to be worse. She is NYHA Class IIb. No orthopnea or PND. No syncope.  On home oxygen for 4 years  Seeing Dr [name] next month.  Had pneumonia shot but routinely avoids flu shot  Labs (6/14): BNP 1190, TSH 1.098  Labs (8/14): K 3.5, Cr 1.06, HDL 43, LDL 117, Hgb 13.8  Recent hospitalizationfor respitory insuf.  Mild volume overload BNP on 06/25/14 was 441   On oxygen 2L continuous  CT 05/20/14 reviewed and no PE With interstitial lung changes  Not smoked since d/c  Kicked out of family practice had disagreement with Dr Lynnae January.  Issues with pain management  Echo:  03/07/14 Study Conclusions  - Left ventricle: The cavity size was normal. Wall thickness was normal. Systolic function was at the lower limits of normal. The estimated ejection fraction was in the range of 50% to 55%. Although no diagnostic regional wall motion abnormality was identified, this possibility cannot be completely excluded on the basis of this study. - Left atrium: The atrium was mildly dilated. - Right atrium: The atrium was mildly dilated. - Pulmonary  arteries: PA peak pressure: 32 mm Hg (S).  ROS: Denies fever, malais, weight loss, blurry vision, decreased visual acuity, cough, sputum,  hemoptysis, pleuritic pain, palpitaitons, heartburn, abdominal pain, melena, lower extremity edema, claudication, or rash.  All other systems reviewed and negative  General: Affect appropriate Chronically ill white female with oxygen HEENT: normal Neck supple with no adenopathy JVP normal no bruits no thyromegaly Lungs expitory wheezing and good diaphragmatic motion Heart:  S1/S2 no murmur, no rub, gallop or click PMI normal Abdomen: benighn, BS positve, no tenderness, no AAA no bruit.  No HSM or HJR Distal pulses intact with no bruits No edema Neuro non-focal Skin warm and dry No muscular weakness   Current Outpatient Prescriptions  Medication Sig Dispense Refill  . albuterol (PROVENTIL HFA;VENTOLIN HFA) 108 (90 BASE) MCG/ACT inhaler Inhale 1-2 puffs into the lungs every 6 (six) hours as needed for wheezing. 1 Inhaler 0  . antiseptic oral rinse (BIOTENE) LIQD 15 mLs by Mouth Rinse route as needed for dry mouth. 473 mL 0  . aspirin 325 MG tablet Take 650 mg by mouth daily.     . benzonatate (TESSALON) 100 MG capsule Take 1 capsule (100 mg total) by mouth 3 (three) times daily as needed for cough. 30 capsule 0  . budesonide-formoterol (SYMBICORT) 160-4.5 MCG/ACT inhaler Inhale 2 puffs into the lungs 2 (two) times daily. 10.2 g 11  . busPIRone (BUSPAR) 10 MG tablet Take 10 mg  by mouth 3 (three) times daily.    . chlorpheniramine (CHLOR-TRIMETON) 4 MG tablet Take 1 tablet (4 mg total) by mouth every 8 (eight) hours as needed for allergies. 90 tablet 0  . diazepam (VALIUM) 2 MG tablet Take 1 tablet (2 mg total) by mouth every 12 (twelve) hours as needed for anxiety. 60 tablet 3  . diltiazem (TIAZAC) 420 MG 24 hr capsule Take 1 capsule (420 mg total) by mouth daily. 90 capsule 0  . FLUoxetine (PROZAC) 20 MG capsule TAKE THREE CAPSULES BY MOUTH ONCE  DAILY (Patient taking differently: Take 60 mg by mouth. TAKE THREE CAPSULES BY MOUTH ONCE DAILY) 90 capsule 5  . guaiFENesin (MUCINEX) 600 MG 12 hr tablet Take 1 tablet (600 mg total) by mouth 2 (two) times daily. 20 tablet 0  . HYDROcodone-acetaminophen (NORCO) 7.5-325 MG per tablet Take 1-2 tablets by mouth every 6 (six) hours as needed for moderate pain. 20 tablet 0  . HYDROcodone-homatropine (HYCODAN) 5-1.5 MG/5ML syrup Take 5 mLs by mouth every 6 (six) hours as needed for cough. 120 mL 0  . hydroxypropyl methylcellulose / hypromellose (ISOPTO TEARS / GONIOVISC) 2.5 % ophthalmic solution Place 1 drop into both eyes as needed for dry eyes.    Marland Kitchen levofloxacin (LEVAQUIN) 750 MG tablet Take 1 tablet (750 mg total) by mouth daily. 5 tablet 0  . nystatin (MYCOSTATIN) 100000 UNIT/ML suspension Take 5 mLs (500,000 Units total) by mouth as needed. 60 mL 0  . pantoprazole (PROTONIX) 40 MG tablet TAKE ONE TABLET BY MOUTH TWICE DAILY 60 tablet 5  . predniSONE (STERAPRED UNI-PAK 21 TAB) 10 MG (21) TBPK tablet Take 6-5-4-3-2-1 tablet PO daily till gone 21 tablet 0  . tiotropium (SPIRIVA) 18 MCG inhalation capsule Place 1 capsule (18 mcg total) into inhaler and inhale daily. 90 capsule 4  . zolpidem (AMBIEN) 5 MG tablet Take 1 tablet (5 mg total) by mouth at bedtime as needed for sleep. 30 tablet 3   No current facility-administered medications for this visit.    Allergies  Ace inhibitors; Diphenhydramine hcl; Erythromycin; Nsaids; Nyquil; Tramadol hcl; Flexeril; and Pseudoephedrine  Electrocardiogram:  afib nonspecific ST/T wave changes  03/01/13  1/21`/16  Afibr rate 70  Poor R wave progression no significant change   Assessment and Plan COPD:  Improved continue oxygen f/u Dr [name] Afib:  Affirmed she does not want anticoagulation.  Add toprol 25 mg in afternoon for rate control  Not actively wheezing Pain:  Encouraged her to see pain clinic to deal with her scripts since Dr Lynnae January not seeing her  anymore

## 2014-06-27 ENCOUNTER — Encounter: Payer: Self-pay | Admitting: Cardiovascular Disease

## 2014-06-27 ENCOUNTER — Ambulatory Visit (INDEPENDENT_AMBULATORY_CARE_PROVIDER_SITE_OTHER): Payer: Medicare Other | Admitting: Cardiovascular Disease

## 2014-06-27 VITALS — BP 126/70 | HR 99 | Ht 65.0 in | Wt 188.0 lb

## 2014-06-27 DIAGNOSIS — J432 Centrilobular emphysema: Secondary | ICD-10-CM

## 2014-06-27 MED ORDER — METOPROLOL SUCCINATE ER 25 MG PO TB24: 25.0000 mg | ORAL_TABLET | Freq: Every day | ORAL | Status: DC

## 2014-06-27 NOTE — Patient Instructions (Addendum)
Medication Instructions:   Your physician has recommended you make the following change in your medication:  START METOPROLOL 25 MG   TAKE  AFTERNOON Labwork: NONE  Testing/Procedures: NONE  Follow-Up:  Your physician wants you to follow-up in: [place] will receive a reminder letter in the mail two months in advance. If you don't receive a letter, please call our office to schedule the follow-up appointment. Any Other Special Instructions Will Be Listed Below (If Applicable).

## 2014-07-02 ENCOUNTER — Encounter: Payer: Self-pay | Admitting: Family Medicine

## 2014-07-02 ENCOUNTER — Ambulatory Visit (HOSPITAL_BASED_OUTPATIENT_CLINIC_OR_DEPARTMENT_OTHER): Payer: Medicare Other | Admitting: Family Medicine

## 2014-07-02 VITALS — BP 98/63 | HR 64 | Temp 98.1°F | Resp 18 | Ht 65.0 in | Wt 187.0 lb

## 2014-07-02 DIAGNOSIS — Z6830 Body mass index (BMI) 30.0-30.9, adult: Secondary | ICD-10-CM

## 2014-07-02 DIAGNOSIS — K219 Gastro-esophageal reflux disease without esophagitis: Secondary | ICD-10-CM | POA: Insufficient documentation

## 2014-07-02 DIAGNOSIS — A084 Viral intestinal infection, unspecified: Secondary | ICD-10-CM | POA: Diagnosis not present

## 2014-07-02 DIAGNOSIS — I10 Essential (primary) hypertension: Secondary | ICD-10-CM

## 2014-07-02 DIAGNOSIS — Z79899 Other long term (current) drug therapy: Secondary | ICD-10-CM | POA: Insufficient documentation

## 2014-07-02 DIAGNOSIS — I5030 Unspecified diastolic (congestive) heart failure: Secondary | ICD-10-CM | POA: Insufficient documentation

## 2014-07-02 DIAGNOSIS — R197 Diarrhea, unspecified: Secondary | ICD-10-CM | POA: Diagnosis not present

## 2014-07-02 DIAGNOSIS — I5032 Chronic diastolic (congestive) heart failure: Secondary | ICD-10-CM | POA: Diagnosis not present

## 2014-07-02 DIAGNOSIS — Z825 Family history of asthma and other chronic lower respiratory diseases: Secondary | ICD-10-CM

## 2014-07-02 DIAGNOSIS — G894 Chronic pain syndrome: Secondary | ICD-10-CM

## 2014-07-02 DIAGNOSIS — J449 Chronic obstructive pulmonary disease, unspecified: Secondary | ICD-10-CM | POA: Diagnosis present

## 2014-07-02 DIAGNOSIS — Z7951 Long term (current) use of inhaled steroids: Secondary | ICD-10-CM | POA: Insufficient documentation

## 2014-07-02 DIAGNOSIS — Z79891 Long term (current) use of opiate analgesic: Secondary | ICD-10-CM | POA: Insufficient documentation

## 2014-07-02 DIAGNOSIS — F42 Obsessive-compulsive disorder: Secondary | ICD-10-CM | POA: Insufficient documentation

## 2014-07-02 DIAGNOSIS — F329 Major depressive disorder, single episode, unspecified: Secondary | ICD-10-CM

## 2014-07-02 DIAGNOSIS — I482 Chronic atrial fibrillation, unspecified: Secondary | ICD-10-CM

## 2014-07-02 DIAGNOSIS — I4891 Unspecified atrial fibrillation: Secondary | ICD-10-CM | POA: Diagnosis present

## 2014-07-02 DIAGNOSIS — F419 Anxiety disorder, unspecified: Secondary | ICD-10-CM

## 2014-07-02 DIAGNOSIS — F17210 Nicotine dependence, cigarettes, uncomplicated: Secondary | ICD-10-CM

## 2014-07-02 DIAGNOSIS — J441 Chronic obstructive pulmonary disease with (acute) exacerbation: Secondary | ICD-10-CM

## 2014-07-02 DIAGNOSIS — Z720 Tobacco use: Secondary | ICD-10-CM

## 2014-07-02 DIAGNOSIS — Z7982 Long term (current) use of aspirin: Secondary | ICD-10-CM

## 2014-07-02 DIAGNOSIS — J189 Pneumonia, unspecified organism: Secondary | ICD-10-CM | POA: Diagnosis not present

## 2014-07-02 DIAGNOSIS — Z9981 Dependence on supplemental oxygen: Secondary | ICD-10-CM

## 2014-07-02 DIAGNOSIS — Z87891 Personal history of nicotine dependence: Secondary | ICD-10-CM

## 2014-07-02 DIAGNOSIS — Z9119 Patient's noncompliance with other medical treatment and regimen: Secondary | ICD-10-CM | POA: Diagnosis present

## 2014-07-02 DIAGNOSIS — F17200 Nicotine dependence, unspecified, uncomplicated: Secondary | ICD-10-CM

## 2014-07-02 DIAGNOSIS — E669 Obesity, unspecified: Secondary | ICD-10-CM | POA: Diagnosis present

## 2014-07-02 DIAGNOSIS — E785 Hyperlipidemia, unspecified: Secondary | ICD-10-CM | POA: Diagnosis present

## 2014-07-02 DIAGNOSIS — J9611 Chronic respiratory failure with hypoxia: Secondary | ICD-10-CM | POA: Diagnosis present

## 2014-07-02 DIAGNOSIS — E876 Hypokalemia: Secondary | ICD-10-CM | POA: Diagnosis present

## 2014-07-02 MED ORDER — CEFTRIAXONE SODIUM 1 G IJ SOLR
500.0000 mg | Freq: Once | INTRAMUSCULAR | Status: AC
  Administered 2014-07-02: 500 mg via INTRAMUSCULAR

## 2014-07-02 MED ORDER — ACETAMINOPHEN-CODEINE #3 300-30 MG PO TABS
1.0000 | ORAL_TABLET | Freq: Four times a day (QID) | ORAL | Status: DC | PRN
Start: 2014-07-02 — End: 2014-07-19

## 2014-07-02 MED ORDER — METOPROLOL SUCCINATE ER 25 MG PO TB24: 12.5000 mg | ORAL_TABLET | Freq: Every day | ORAL | Status: DC

## 2014-07-02 MED ORDER — LEVOFLOXACIN 750 MG PO TABS: 750.0000 mg | ORAL_TABLET | Freq: Every day | ORAL | Status: DC
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
## 2014-07-02 NOTE — Progress Notes (Signed)
Patient hospitalized for pneumonia and flu from end of March through beginning of May, including a [facility] admission. Patient reports pain, both knees, hips, and shoulders. Worst pain in right shoulder at level 10, described as "a million little men inside my body with hammers and chainsaws cutting pieces". Patient reports not being allergic to flexeril but it is listed on allergies.

## 2014-07-05 ENCOUNTER — Other Ambulatory Visit (HOSPITAL_COMMUNITY): Payer: Self-pay

## 2014-07-05 ENCOUNTER — Emergency Department (HOSPITAL_COMMUNITY): Payer: Medicare Other

## 2014-07-05 ENCOUNTER — Inpatient Hospital Stay (HOSPITAL_COMMUNITY)
Admission: EM | Admit: 2014-07-05 | Discharge: 2014-07-08 | DRG: 392 | Disposition: A | Discharge status: Home / Self Care | Payer: Medicare Other | Attending: Internal Medicine | Admitting: Internal Medicine

## 2014-07-05 ENCOUNTER — Encounter (HOSPITAL_COMMUNITY): Payer: Self-pay | Admitting: Emergency Medicine

## 2014-07-05 DIAGNOSIS — I482 Chronic atrial fibrillation: Secondary | ICD-10-CM | POA: Diagnosis not present

## 2014-07-05 DIAGNOSIS — Z9981 Dependence on supplemental oxygen: Secondary | ICD-10-CM | POA: Diagnosis not present

## 2014-07-05 DIAGNOSIS — J449 Chronic obstructive pulmonary disease, unspecified: Secondary | ICD-10-CM | POA: Diagnosis present

## 2014-07-05 DIAGNOSIS — I10 Essential (primary) hypertension: Secondary | ICD-10-CM | POA: Diagnosis present

## 2014-07-05 DIAGNOSIS — R05 Cough: Secondary | ICD-10-CM

## 2014-07-05 DIAGNOSIS — E785 Hyperlipidemia, unspecified: Secondary | ICD-10-CM

## 2014-07-05 DIAGNOSIS — R197 Diarrhea, unspecified: Secondary | ICD-10-CM | POA: Diagnosis present

## 2014-07-05 DIAGNOSIS — E876 Hypokalemia: Secondary | ICD-10-CM

## 2014-07-05 DIAGNOSIS — I4891 Unspecified atrial fibrillation: Secondary | ICD-10-CM | POA: Diagnosis present

## 2014-07-05 DIAGNOSIS — A084 Viral intestinal infection, unspecified: Secondary | ICD-10-CM | POA: Diagnosis present

## 2014-07-05 DIAGNOSIS — I5032 Chronic diastolic (congestive) heart failure: Secondary | ICD-10-CM | POA: Diagnosis present

## 2014-07-05 DIAGNOSIS — Z7982 Long term (current) use of aspirin: Secondary | ICD-10-CM | POA: Diagnosis not present

## 2014-07-05 DIAGNOSIS — Z87891 Personal history of nicotine dependence: Secondary | ICD-10-CM | POA: Diagnosis not present

## 2014-07-05 DIAGNOSIS — R1084 Generalized abdominal pain: Secondary | ICD-10-CM

## 2014-07-05 DIAGNOSIS — R059 Cough, unspecified: Secondary | ICD-10-CM

## 2014-07-05 DIAGNOSIS — M1610 Unilateral primary osteoarthritis, unspecified hip: Secondary | ICD-10-CM

## 2014-07-05 DIAGNOSIS — Z6830 Body mass index (BMI) 30.0-30.9, adult: Secondary | ICD-10-CM | POA: Diagnosis not present

## 2014-07-05 DIAGNOSIS — F419 Anxiety disorder, unspecified: Secondary | ICD-10-CM | POA: Diagnosis present

## 2014-07-05 DIAGNOSIS — Z79899 Other long term (current) drug therapy: Secondary | ICD-10-CM | POA: Diagnosis not present

## 2014-07-05 DIAGNOSIS — J9611 Chronic respiratory failure with hypoxia: Secondary | ICD-10-CM | POA: Diagnosis present

## 2014-07-05 DIAGNOSIS — Z825 Family history of asthma and other chronic lower respiratory diseases: Secondary | ICD-10-CM | POA: Diagnosis not present

## 2014-07-05 DIAGNOSIS — Z9119 Patient's noncompliance with other medical treatment and regimen: Secondary | ICD-10-CM | POA: Diagnosis present

## 2014-07-05 DIAGNOSIS — E669 Obesity, unspecified: Secondary | ICD-10-CM | POA: Diagnosis present

## 2014-07-05 LAB — COMPREHENSIVE METABOLIC PANEL
ALBUMIN: 3.2 g/dL — AB (ref 3.5–5.0)
ALT: 12 U/L — ABNORMAL LOW (ref 14–54)
ANION GAP: 12 (ref 5–15)
AST: 12 U/L — AB (ref 15–41)
Alkaline Phosphatase: 75 U/L (ref 38–126)
BUN: 12 mg/dL (ref 6–20)
CO2: 34 mmol/L — ABNORMAL HIGH (ref 22–32)
Calcium: 8.4 mg/dL — ABNORMAL LOW (ref 8.9–10.3)
Chloride: 95 mmol/L — ABNORMAL LOW (ref 101–111)
Creatinine, Ser: 0.93 mg/dL (ref 0.44–1.00)
GFR calc Af Amer: >60 mL/min (ref >60)
GLUCOSE: 125 mg/dL — AB (ref 65–99)
Potassium: 2.5 mmol/L — CL (ref 3.5–5.1)
Sodium: 141 mmol/L (ref 135–145)
Total Bilirubin: 0.6 mg/dL (ref 0.3–1.2)
Total Protein: 6.2 g/dL — ABNORMAL LOW (ref 6.5–8.1)

## 2014-07-05 LAB — CBC WITH DIFFERENTIAL/PLATELET
Basophils Absolute: 0 10*3/uL (ref 0.0–0.1)
Basophils Relative: 0 % (ref 0–1)
EOS ABS: 0.2 10*3/uL (ref 0.0–0.7)
EOS PCT: 2 % (ref 0–5)
HCT: 38.3 % (ref 36.0–46.0)
HEMOGLOBIN: 11.8 g/dL — AB (ref 12.0–15.0)
LYMPHS ABS: 0.8 10*3/uL (ref 0.7–4.0)
Lymphocytes Relative: 7 % — ABNORMAL LOW (ref 12–46)
MCH: 30.2 pg (ref 26.0–34.0)
MCHC: 30.8 g/dL (ref 30.0–36.0)
MCV: 98 fL (ref 78.0–100.0)
Monocytes Absolute: 1.1 10*3/uL — ABNORMAL HIGH (ref 0.1–1.0)
Monocytes Relative: 9 % (ref 3–12)
Neutro Abs: 9.7 10*3/uL — ABNORMAL HIGH (ref 1.7–7.7)
Neutrophils Relative %: 82 % — ABNORMAL HIGH (ref 43–77)
Platelets: 201 10*3/uL (ref 150–400)
RBC: 3.91 MIL/uL (ref 3.87–5.11)
RDW: 14.8 % (ref 11.5–15.5)
WBC: 11.8 10*3/uL — ABNORMAL HIGH (ref 4.0–10.5)

## 2014-07-05 LAB — CLOSTRIDIUM DIFFICILE BY PCR: Toxigenic C. Difficile by PCR: NEGATIVE

## 2014-07-05 LAB — I-STAT CG4 LACTIC ACID, ED: Lactic Acid, Venous: 0.96 mmol/L (ref 0.5–2.0)

## 2014-07-05 LAB — LIPASE, BLOOD: Lipase: 11 U/L — ABNORMAL LOW (ref 22–51)

## 2014-07-05 LAB — I-STAT TROPONIN, ED: Troponin i, poc: 0 ng/mL (ref 0.00–0.08)

## 2014-07-05 MED ORDER — POTASSIUM CHLORIDE 10 MEQ/100ML IV SOLN
10.0000 meq | INTRAVENOUS | Status: AC
  Administered 2014-07-05 – 2014-07-06 (×4): 10 meq via INTRAVENOUS
  Filled 2014-07-05 (×3): qty 100

## 2014-07-05 MED ORDER — BUDESONIDE-FORMOTEROL FUMARATE 160-4.5 MCG/ACT IN AERO
2.0000 | INHALATION_SPRAY | Freq: Two times a day (BID) | RESPIRATORY_TRACT | Status: DC
  Administered 2014-07-06 – 2014-07-08 (×5): 2 via RESPIRATORY_TRACT
  Filled 2014-07-05: qty 6

## 2014-07-05 MED ORDER — POTASSIUM CHLORIDE CRYS ER 20 MEQ PO TBCR
40.0000 meq | EXTENDED_RELEASE_TABLET | Freq: Once | ORAL | Status: AC
  Administered 2014-07-05: 40 meq via ORAL
  Filled 2014-07-05: qty 2

## 2014-07-05 MED ORDER — IOHEXOL 300 MG/ML  SOLN
100.0000 mL | Freq: Once | INTRAMUSCULAR | Status: AC | PRN
  Administered 2014-07-05: 100 mL via INTRAVENOUS

## 2014-07-05 MED ORDER — HEPARIN SODIUM (PORCINE) 5000 UNIT/ML IJ SOLN
5000.0000 [IU] | Freq: Three times a day (TID) | INTRAMUSCULAR | Status: DC
  Administered 2014-07-07: 5000 [IU] via SUBCUTANEOUS
  Filled 2014-07-05 (×9): qty 1

## 2014-07-05 MED ORDER — DIAZEPAM 2 MG PO TABS
2.0000 mg | ORAL_TABLET | Freq: Two times a day (BID) | ORAL | Status: DC | PRN
  Administered 2014-07-06: 2 mg via ORAL
  Filled 2014-07-05: qty 1

## 2014-07-05 MED ORDER — ASPIRIN EC 325 MG PO TBEC
650.0000 mg | DELAYED_RELEASE_TABLET | Freq: Every day | ORAL | Status: DC
Start: 2014-07-06 — End: 2014-07-08
  Administered 2014-07-06 – 2014-07-08 (×3): 650 mg via ORAL
  Filled 2014-07-05 (×3): qty 2

## 2014-07-05 MED ORDER — ONDANSETRON HCL 4 MG/2ML IJ SOLN: 4.0000 mg | Freq: Four times a day (QID) | INTRAMUSCULAR | Status: DC | PRN

## 2014-07-05 MED ORDER — ALBUTEROL SULFATE HFA 108 (90 BASE) MCG/ACT IN AERS: 1.0000 | INHALATION_SPRAY | Freq: Four times a day (QID) | RESPIRATORY_TRACT | Status: DC | PRN

## 2014-07-05 MED ORDER — DILTIAZEM HCL ER BEADS 300 MG PO CP24
420.0000 mg | ORAL_CAPSULE | Freq: Every day | ORAL | Status: DC
  Administered 2014-07-06 – 2014-07-08 (×3): 420 mg via ORAL
  Filled 2014-07-05 (×5): qty 1

## 2014-07-05 MED ORDER — FLUOXETINE HCL 20 MG PO CAPS
40.0000 mg | ORAL_CAPSULE | Freq: Every day | ORAL | Status: DC
Start: 2014-07-06 — End: 2014-07-08
  Administered 2014-07-06 – 2014-07-08 (×3): 40 mg via ORAL
  Filled 2014-07-05 (×3): qty 2

## 2014-07-05 MED ORDER — SODIUM CHLORIDE 0.9 % IJ SOLN
3.0000 mL | Freq: Two times a day (BID) | INTRAMUSCULAR | Status: DC
  Administered 2014-07-06 – 2014-07-07 (×3): 3 mL via INTRAVENOUS

## 2014-07-05 MED ORDER — HYDROCODONE-ACETAMINOPHEN 5-325 MG PO TABS
1.0000 | ORAL_TABLET | Freq: Once | ORAL | Status: AC
  Administered 2014-07-05: 1 via ORAL
  Filled 2014-07-05: qty 1

## 2014-07-05 MED ORDER — ALBUTEROL SULFATE (2.5 MG/3ML) 0.083% IN NEBU: 2.5000 mg | INHALATION_SOLUTION | Freq: Four times a day (QID) | RESPIRATORY_TRACT | Status: DC | PRN

## 2014-07-05 MED ORDER — TIOTROPIUM BROMIDE MONOHYDRATE 18 MCG IN CAPS
18.0000 ug | ORAL_CAPSULE | Freq: Every day | RESPIRATORY_TRACT | Status: DC
  Administered 2014-07-06 – 2014-07-08 (×3): 18 ug via RESPIRATORY_TRACT
  Filled 2014-07-05: qty 5

## 2014-07-05 MED ORDER — HYDROCODONE-ACETAMINOPHEN 7.5-325 MG PO TABS
1.0000 | ORAL_TABLET | Freq: Four times a day (QID) | ORAL | Status: DC | PRN
  Administered 2014-07-06 – 2014-07-08 (×9): 1 via ORAL
  Filled 2014-07-05 (×9): qty 1

## 2014-07-05 MED ORDER — BUSPIRONE HCL 10 MG PO TABS
10.0000 mg | ORAL_TABLET | Freq: Three times a day (TID) | ORAL | Status: DC
Start: 2014-07-06 — End: 2014-07-08
  Administered 2014-07-06 – 2014-07-08 (×8): 10 mg via ORAL
  Filled 2014-07-05 (×9): qty 1

## 2014-07-05 MED ORDER — ALBUTEROL SULFATE (2.5 MG/3ML) 0.083% IN NEBU
5.0000 mg | INHALATION_SOLUTION | Freq: Once | RESPIRATORY_TRACT | Status: AC
Start: 2014-07-05 — End: 2014-07-05
  Administered 2014-07-05: 5 mg via RESPIRATORY_TRACT
  Filled 2014-07-05: qty 6

## 2014-07-05 MED ORDER — POLYVINYL ALCOHOL 1.4 % OP SOLN
1.0000 [drp] | Freq: Every day | OPHTHALMIC | Status: DC | PRN
  Filled 2014-07-05: qty 15

## 2014-07-05 MED ORDER — PANTOPRAZOLE SODIUM 40 MG PO TBEC
40.0000 mg | DELAYED_RELEASE_TABLET | Freq: Every day | ORAL | Status: DC
  Administered 2014-07-06 – 2014-07-08 (×3): 40 mg via ORAL
  Filled 2014-07-05 (×4): qty 1

## 2014-07-05 MED ORDER — ONDANSETRON HCL 4 MG PO TABS: 4.0000 mg | ORAL_TABLET | Freq: Four times a day (QID) | ORAL | Status: DC | PRN

## 2014-07-05 MED ORDER — METHYLPREDNISOLONE SODIUM SUCC 125 MG IJ SOLR
125.0000 mg | Freq: Once | INTRAMUSCULAR | Status: AC
  Administered 2014-07-05: 125 mg via INTRAVENOUS
  Filled 2014-07-05: qty 2

## 2014-07-05 MED ORDER — VITAMIN B-1 100 MG PO TABS
100.0000 mg | ORAL_TABLET | Freq: Every day | ORAL | Status: DC
  Administered 2014-07-06 – 2014-07-08 (×3): 100 mg via ORAL
  Filled 2014-07-05 (×3): qty 1

## 2014-07-05 MED ORDER — SODIUM CHLORIDE 0.9 % IV SOLN
INTRAVENOUS | Status: DC
  Administered 2014-07-06 (×2): via INTRAVENOUS

## 2014-07-05 MED ORDER — METOPROLOL SUCCINATE ER 25 MG PO TB24
12.5000 mg | ORAL_TABLET | Freq: Every day | ORAL | Status: DC
Start: 2014-07-06 — End: 2014-07-08
  Administered 2014-07-06 – 2014-07-08 (×3): 12.5 mg via ORAL
  Filled 2014-07-05 (×3): qty 1

## 2014-07-05 MED ORDER — HYDROCODONE-HOMATROPINE 5-1.5 MG/5ML PO SYRP: 5.0000 mL | ORAL_SOLUTION | Freq: Four times a day (QID) | ORAL | Status: DC | PRN

## 2014-07-05 MED ORDER — GUAIFENESIN ER 600 MG PO TB12: 600.0000 mg | ORAL_TABLET | Freq: Two times a day (BID) | ORAL | Status: DC | PRN

## 2014-07-05 MED ORDER — ADULT MULTIVITAMIN W/MINERALS CH
1.0000 | ORAL_TABLET | Freq: Every day | ORAL | Status: DC
Start: 2014-07-06 — End: 2014-07-08
  Administered 2014-07-06 – 2014-07-08 (×3): 1 via ORAL
  Filled 2014-07-05 (×3): qty 1

## 2014-07-05 MED ORDER — CETYLPYRIDINIUM CHLORIDE 0.05 % MT LIQD
7.0000 mL | Freq: Two times a day (BID) | OROMUCOSAL | Status: DC
  Administered 2014-07-06 (×2): 7 mL via OROMUCOSAL

## 2014-07-05 MED ORDER — SODIUM CHLORIDE 0.9 % IV BOLUS (SEPSIS)
500.0000 mL | Freq: Once | INTRAVENOUS | Status: AC
  Administered 2014-07-05: 500 mL via INTRAVENOUS

## 2014-07-05 MED ORDER — FOLIC ACID 1 MG PO TABS
1.0000 mg | ORAL_TABLET | Freq: Every day | ORAL | Status: DC
  Administered 2014-07-06 – 2014-07-08 (×3): 1 mg via ORAL
  Filled 2014-07-05 (×3): qty 1

## 2014-07-05 NOTE — ED Provider Notes (Signed)
Medical screening examination/treatment/procedure(s) were conducted as a shared visit with non-physician practitioner(s) and myself.  I personally evaluated the patient during the encounter.     Date: 07/05/2014 18:51   Rate: 82  Rhythm: A. fib  QRS Axis: Left axis deviation  Intervals: normal  ST/T Wave abnormalities: normal  Conduction Disutrbances: none  Narrative Interpretation: A. fib, left axis deviation      Pt is a 69 y.o. female who presents emergency department with vomiting and diarrhea. She does have a leukocytosis with left shift. Potassium is low at 2.5. Will replace. Troponin negative. Lactate normal. EKG shows A. fib with no new ischemic changes, interval changes. CT scan shows probable distal common bile duct strictures causing mild dilation with normal LFTs. She also has cholelithiasis and diverticulosis without diverticulitis. Stool cultures pending. Will admit for IV hydration, potassium replacement, symptom control.  [place], DO 07/05/14 2353

## 2014-07-05 NOTE — Telephone Encounter (Signed)
Error

## 2014-07-05 NOTE — ED Notes (Addendum)
K+ 2.5  Informed primary nurse

## 2014-07-05 NOTE — ED Notes (Signed)
Per EMS. Pt from home. Pt reports she was switched from hydrocodone/tylenol to codeine 4 days ago. Pt began to have n/v/d last night. This am woke up with sore throat and eye swelling. Hx of multiple allergies, pt thinks it is from new pain medication. Pt on 2L [place] at home due to COPD. '50mg'$  benedryl PO and '4mg'$  zofran IV given by EMS prior to arrival. Nausea relieved.

## 2014-07-05 NOTE — H&P (Addendum)
[facility] History and Physical  Kelsey Garcia PXT:[id number] DOB: Oct 14, 1945 DOA: 07/05/2014  Referring physician: Margarita Mail, PA PCP: No PCP Per Patient   Chief Complaint: Vomiting Diarrhea  HPI: Kelsey Garcia is a 69 y.o. female with history of COPD recent admission for acute respiratory failure due to influenza on antibiotics diastolic CHF with an EF of 50-55% presents with nausea vomiting and diarrhea. Patient was admitted on 5/10 with acute respiratory failure due to COPD exacerbation discharged on 5/13. She was seen back in the office on 5/16 with similar chronic DOE. Patient of note is not compliant with anticoagulation and also does not take lasix regularly. She has a chronic cough which she states is improved only by hycoden. Now she presents with nausea and vomiting in addition to diarrhea. Patient states stool is liquid and no blood is noted. She had been on Levaquin recently started for ?bronchitis/COPD. She has no fever noted. She also does admit to generalized abdominal pain noted. She states symptoms came on after being switched on her narcotics by pain management. She states tat she does not have abdominal pain now.   Review of Systems:  Constitutional:  +night sweats, +chills, fatigue.  HEENT:  +headaches, No sneezing, itching, ear ache, +nasal congestion, +post nasal drip,  Cardio-vascular:  No chest pain, swelling in lower extremities, +dizziness, no palpitations  GI:  No heartburn, no abdominal pain, +nausea, +vomiting, +diarrhea  Resp:  Chronic shortness of breath with exertion +non-productive cough No coughing up of blood +wheezing Skin:  no rash or lesions.  GU:  no dysuria, change in color of urine, no urgency or frequency Musculoskeletal:  No joint pain or swelling. No decreased range of motion.  Psych:  No change in mood or affect. No depression or anxiety   Past Medical History  Diagnosis Date  . Atrial fibrillation   . Hypertension   .  Insomnia   . Nonischemic cardiomyopathy   . Hyperlipidemia   . Anxiety   . Cholelithiasis   . Insomnia   . Mitral regurgitation     noted 2010  . H/O epistaxis   . Rhinitis, allergic   . CHF (congestive heart failure)   . Pneumonia     "several times w/exacerbations of the COPD; nothing in the last year" (07/15/2012)  . COPD (chronic obstructive pulmonary disease)     as of 7/13 on 2-3L, pfts 10/2008 with mod obstruction  . Chronic bronchitis with COPD (chronic obstructive pulmonary disease)   . Shortness of breath     "all the time right now" (07/15/2012)  . GERD (gastroesophageal reflux disease)   . OEVOJJKK(938.1)     "weekly" (07/15/2012)  . Migraines     "weekly for awhile; cleared up as I got older" (07/15/2012)  . DJD (degenerative joint disease)   . Arthritis     "all over" (07/15/2012)  . OCD (obsessive compulsive disorder)   . OCD (obsessive compulsive disorder)   . Depression     h/o SI; "last time I was really serious about it was ~ 1997" (07/15/2012)   Past Surgical History  Procedure Laterality Date  . Tubal ligation  1972  . Cardioversion  2003; 07/2003   Social History:  reports that she quit smoking about 2 weeks ago. Her smoking use included Cigarettes. She has a 35 pack-year smoking history. She has never used smokeless tobacco. She reports that she does not drink alcohol or use illicit drugs.  Allergies  Allergen Reactions  . Ace  Inhibitors Cough  . Diphenhydramine Hcl Other (See Comments)    "feels like my skin is crawling, and legs twitches"  . Erythromycin Diarrhea  . Nsaids Nausea And Vomiting  . Nyquil [Pseudoeph-Doxylamine-Dm-Apap] Itching  . Tramadol Hcl Other (See Comments)    stomach pain, hallucinations  . Tomato Other (See Comments)    Stomach swells  . Flexeril [Cyclobenzaprine] Anxiety  . Orange Fruit [Citrus] Rash  . Pseudoephedrine Palpitations    Family History  Problem Relation Age of Onset  . Asthma    . Emphysema    . Allergies    .  Cancer      aunt had several types of cancer  . COPD Mother   . Emphysema Mother   . Cirrhosis Father      Prior to Admission medications   Medication Sig Start Date End Date Taking? Authorizing Provider  acetaminophen-codeine (TYLENOL #3) 300-30 MG per tablet Take 1 tablet by mouth every 6 (six) hours as needed for moderate pain. 07/02/14  Yes Arnoldo Morale, MD  albuterol (PROVENTIL HFA;VENTOLIN HFA) 108 (90 BASE) MCG/ACT inhaler Inhale 1-2 puffs into the lungs every 6 (six) hours as needed for wheezing. 04/28/14  Yes Tanna Furry, MD  aspirin 325 MG tablet Take 650 mg by mouth daily.    Yes Historical Provider, MD  budesonide-formoterol (SYMBICORT) 160-4.5 MCG/ACT inhaler Inhale 2 puffs into the lungs 2 (two) times daily. 06/05/13  Yes Annia Belt, MD  busPIRone (BUSPAR) 10 MG tablet Take 10 mg by mouth 3 (three) times daily. 04/22/14  Yes Historical Provider, MD  chlorpheniramine (CHLOR-TRIMETON) 4 MG tablet Take 1 tablet (4 mg total) by mouth every 8 (eight) hours as needed for allergies. 11/13/13  Yes Tasrif Ahmed, MD  diazepam (VALIUM) 2 MG tablet Take 1 tablet (2 mg total) by mouth every 12 (twelve) hours as needed for anxiety. 12/06/12  Yes Wilber Oliphant, MD  diltiazem (TIAZAC) 420 MG 24 hr capsule Take 1 capsule (420 mg total) by mouth daily. 05/09/14 08/08/15 Yes Bartholomew Crews, MD  FLUoxetine (PROZAC) 20 MG capsule TAKE THREE CAPSULES BY MOUTH ONCE DAILY Patient taking differently: Take 60 mg by mouth. TAKE THREE CAPSULES BY MOUTH ONCE DAILY 01/03/14  Yes Tasrif Ahmed, MD  guaiFENesin (MUCINEX) 600 MG 12 hr tablet Take 1 tablet (600 mg total) by mouth 2 (two) times daily. Patient taking differently: Take 600 mg by mouth 2 (two) times daily as needed for cough.  04/28/14  Yes Tanna Furry, MD  HYDROcodone-acetaminophen (NORCO) 7.5-325 MG per tablet Take 1-2 tablets by mouth every 6 (six) hours as needed for moderate pain. 06/22/14 07/17/15 Yes Verlee Monte, MD  HYDROcodone-homatropine  (HYCODAN) 5-1.5 MG/5ML syrup Take 5 mLs by mouth every 6 (six) hours as needed for cough. 04/30/14  Yes Bartholomew Crews, MD  hydroxypropyl methylcellulose / hypromellose (ISOPTO TEARS / GONIOVISC) 2.5 % ophthalmic solution Place 1 drop into both eyes daily as needed for dry eyes (dry eyes).    Yes Historical Provider, MD  levofloxacin (LEVAQUIN) 750 MG tablet Take 1 tablet (750 mg total) by mouth daily. 07/02/14  Yes Arnoldo Morale, MD  metoprolol succinate (TOPROL-XL) 25 MG 24 hr tablet Take 0.5 tablets (12.5 mg total) by mouth daily after lunch. 07/02/14  Yes Arnoldo Morale, MD  nystatin (MYCOSTATIN) 100000 UNIT/ML suspension Take 5 mLs (500,000 Units total) by mouth as needed. 04/16/14  Yes Tasrif Ahmed, MD  pantoprazole (PROTONIX) 40 MG tablet TAKE ONE TABLET BY MOUTH TWICE DAILY 01/03/14  Yes Dellia Nims, MD  tiotropium (SPIRIVA) 18 MCG inhalation capsule Place 1 capsule (18 mcg total) into inhaler and inhale daily. 07/05/13  Yes Pollie Friar, MD  antiseptic oral rinse (BIOTENE) LIQD 15 mLs by Mouth Rinse route as needed for dry mouth. Patient not taking: Reported on 07/02/2014 06/22/14   Verlee Monte, MD  predniSONE (STERAPRED UNI-PAK 21 TAB) 10 MG (21) TBPK tablet Take 6-5-4-3-2-1 tablet PO daily till gone Patient not taking: Reported on 07/02/2014 06/22/14   Verlee Monte, MD  zolpidem (AMBIEN) 5 MG tablet Take 1 tablet (5 mg total) by mouth at bedtime as needed for sleep. Patient not taking: Reported on 07/05/2014 03/09/14   Dellia Nims, MD   Physical Exam: Filed Vitals:   07/05/14 1806 07/05/14 1813  BP:  105/65  Pulse:  92  Temp:  99.4 F (37.4 C)  TempSrc:  Oral  Resp:  16  SpO2: 94% 92%    Wt Readings from Last 3 Encounters:  07/02/14 84.823 kg (187 lb)  06/27/14 85.276 kg (188 lb)  06/21/14 85.27 kg (187 lb 15.8 oz)    General:  Appears calm and comfortable Eyes: PERRL, normal lids, irises & conjunctiva ENT: grossly normal hearing, lips & tongue Neck: no LAD, masses or  thyromegaly Cardiovascular: IRR, no m/r/g. No LE edema. Respiratory: CTA bilaterally, no w/r/r. Normal respiratory effort. Abdomen: soft, ntnd Skin: no rash or induration seen on limited exam Musculoskeletal: grossly normal tone BUE/BLE Psychiatric: grossly normal mood and affect, speech fluent and appropriate Neurologic: grossly non-focal.          Labs on Admission:  Basic Metabolic Panel:  Recent Labs Lab 07/05/14 1909  NA 141  K 2.5*  CL 95*  CO2 34*  GLUCOSE 125*  BUN 12  CREATININE 0.93  CALCIUM 8.4*   Liver Function Tests:  Recent Labs Lab 07/05/14 1909  AST 12*  ALT 12*  ALKPHOS 75  BILITOT 0.6  PROT 6.2*  ALBUMIN 3.2*    Recent Labs Lab 07/05/14 1909  LIPASE 11*   No results for input(s): AMMONIA in the last 168 hours. CBC:  Recent Labs Lab 07/05/14 1909  WBC 11.8*  NEUTROABS 9.7*  HGB 11.8*  HCT 38.3  MCV 98.0  PLT 201   Cardiac Enzymes: No results for input(s): CKTOTAL, CKMB, CKMBINDEX, TROPONINI in the last 168 hours.  BNP (last 3 results)  Recent Labs  05/16/14 1055 06/19/14 1032 06/19/14 1745  BNP 145.0* 418.5* 441.4*    ProBNP (last 3 results) No results for input(s): PROBNP in the last 8760 hours.  CBG: No results for input(s): GLUCAP in the last 168 hours.  Radiological Exams on Admission: Dg Chest 2 View  07/05/2014   CLINICAL DATA:  Sore throat and eye swelling after recent medication adjustment. History of multiple prior medication allergies.  EXAM: CHEST  2 VIEW  COMPARISON:  06/19/2014; 05/19/2014; chest CT - 05/20/2014  FINDINGS: Grossly unchanged borderline enlarged cardiac silhouette and mediastinal contours with mild tortuosity of the thoracic aorta. Overall improved aeration of the lungs with persistent minimal bibasilar opacities there is persistent mild elevation / eventration of the anterior aspect the right hemidiaphragm. No new focal airspace opacities. No pleural effusion or pneumothorax. No evidence of  edema. No acute osseous abnormalities. Calcifications seen best on the provided lateral radiograph correlate with the cholelithiasis demonstrated on recently obtained chest CT.  IMPRESSION: 1. Improved aeration of the lungs without acute cardiopulmonary disease. 2. Cholelithiasis.   Electronically Signed   By: Jenny Reichmann  Watts M.D.   On: 07/05/2014 19:45      Assessment/Plan Principal Problem:   Hypokalemia Active Problems:   HLD (hyperlipidemia)   Essential hypertension   COPD (chronic obstructive pulmonary disease)   Chronic diastolic CHF (congestive heart failure)   Atrial fibrillation   Generalized abdominal pain   Chronic respiratory failure with hypoxia   1. Hypokalemia -will replace with IV postasium overnight x4 runs -will repeat labs in am -check Magnesium level  2. Generalized Abdominal Pain/Diarrhea -will check Stool C Diff had been recently admitted and on antibiotics -CT scan of the abdomen shows no acute pathology and now she states her pain has resolved  3. Chronic Respiratory failure with hypoxia -will be continued on oxygen therapy -continue inhalers as ordered  4. COPD -continue with proventil spiriva and symbicort -will continue with mucinex as needed -on chronic oxygen therapy  5. HTN -will continue with antihypertensives -monitor pressures  6. Atrial Fibrillation -rate controlled will monitor on telemetry -has chronically refused anticoagulation  7. Hyperlipidemia -will check lipid panel -not on statins  8. Hyperglycemia -monitor FSBS -A1C last measured was 5.7 in April  9. Anxiety Chronic Narcotic Management -will get a Pharmacy consult as there appears to be multiple issues with narcotic duplication on this patient -she is followed by Pain management as an outpatient   Code Status: Full Code (must indicate code status--if unknown or must be presumed, indicate so) DVT Prophylaxis:Heparin Family Communication: None (indicate person spoken  with, if applicable, with phone number if by telephone) Disposition Plan: Home (indicate anticipated LOS)  Time spent: 64mn  Virginie Josten A [facility] Pager [phone]

## 2014-07-05 NOTE — ED Notes (Signed)
Bed: RESA Expected date:  Expected time:  Means of arrival:  Comments: ems

## 2014-07-05 NOTE — Telephone Encounter (Signed)
Pt call to let us know she thinks she may have had a allergic reaction to Hydrocodone pills that were given to her. She stated she had some diarrhea, vomiting and her face is swollen on one side. I advised patient she would need to be seen to determine so that we can examine her. Patient stated she does not have transportation at this time. I suggest to patient if she became worse that she would need to go to the ER to be check out. Pt agree to understanding.

## 2014-07-05 NOTE — ED Provider Notes (Signed)
CSN: [id number]     Arrival date & time 07/05/14  1755 History   First MD Initiated Contact with Patient 07/05/14 1816     Chief Complaint  Patient presents with  . Emesis  . Diarrhea  . Facial Swelling     (Consider location/radiation/quality/duration/timing/severity/associated sxs/prior Treatment) HPI  Kelsey Garcia is a(n) 69 y.o. female who presents with cc of n/v/d.  She has a pmh of afib, htn, COPD on 2 L nasal cannula. She was discharged on 5/13 after having acute respiratory failure. She is currently on Levaquin. @ days ago she developed nausea with multiple episodes of vomiting NBNB vomitus. Yesterday she began having voluminous diarrhea which is watery and brown. She has had multiple episodes of bowel incontinence. Patient complains of chills. She denies fever. She has diffuse abdominal tenderness. She complains of worsening cough symptoms since her release. She continues to take Levaquin. Patient also complains of mouth pain which she states feels like "eaten glass." Patient states that all of her symptoms came on after she was switched from hydrocodone to [place] by her pain management clinic. She is afraid she may be allergic. She complains of some swelling in her lower lip, but denies any other symptoms such as change in voice, difficulty swallowing, throat swelling. She does have chronic wheezing, which is unchanged. Patient denies chest pain or shortness of breath unusual from her normal shortness of breath  Past Medical History  Diagnosis Date  . Atrial fibrillation   . Hypertension   . Insomnia   . Nonischemic cardiomyopathy   . Hyperlipidemia   . Anxiety   . Cholelithiasis   . Insomnia   . Mitral regurgitation     noted 2010  . H/O epistaxis   . Rhinitis, allergic   . CHF (congestive heart failure)   . Pneumonia     "several times w/exacerbations of the COPD; nothing in the last year" (07/15/2012)  . COPD (chronic obstructive pulmonary disease)     as of 7/13 on 2-3L,  pfts 10/2008 with mod obstruction  . Chronic bronchitis with COPD (chronic obstructive pulmonary disease)   . Shortness of breath     "all the time right now" (07/15/2012)  . GERD (gastroesophageal reflux disease)   . NATFTDDU(202.5)     "weekly" (07/15/2012)  . Migraines     "weekly for awhile; cleared up as I got older" (07/15/2012)  . DJD (degenerative joint disease)   . Arthritis     "all over" (07/15/2012)  . OCD (obsessive compulsive disorder)   . OCD (obsessive compulsive disorder)   . Depression     h/o SI; "last time I was really serious about it was ~ 1997" (07/15/2012)   Past Surgical History  Procedure Laterality Date  . Tubal ligation  1972  . Cardioversion  2003; 07/2003   Family History  Problem Relation Age of Onset  . Asthma    . Emphysema    . Allergies    . Cancer      aunt had several types of cancer  . COPD Mother   . Emphysema Mother   . Cirrhosis Father    History  Substance Use Topics  . Smoking status: Former Smoker -- 1.00 packs/day for 35 years    Types: Cigarettes    Quit date: 06/18/2014  . Smokeless tobacco: Never Used     Comment: No desire to quit at this time./ 1PPD  . Alcohol Use: No   OB History    No  data available     Review of Systems Ten systems reviewed and are negative for acute change, except as noted in the HPI.     Allergies  Ace inhibitors; Diphenhydramine hcl; Erythromycin; Nsaids; Nyquil; Tramadol hcl; Tomato; Flexeril; Orange fruit; and Pseudoephedrine  Home Medications   Prior to Admission medications   Medication Sig Start Date End Date Taking? Authorizing Provider  acetaminophen-codeine (TYLENOL #3) 300-30 MG per tablet Take 1 tablet by mouth every 6 (six) hours as needed for moderate pain. 07/02/14   Arnoldo Morale, MD  albuterol (PROVENTIL HFA;VENTOLIN HFA) 108 (90 BASE) MCG/ACT inhaler Inhale 1-2 puffs into the lungs every 6 (six) hours as needed for wheezing. 04/28/14   Tanna Furry, MD  antiseptic oral rinse (BIOTENE)  LIQD 15 mLs by Mouth Rinse route as needed for dry mouth. Patient not taking: Reported on 07/02/2014 06/22/14   Verlee Monte, MD  aspirin 325 MG tablet Take 650 mg by mouth daily.     Historical Provider, MD  budesonide-formoterol (SYMBICORT) 160-4.5 MCG/ACT inhaler Inhale 2 puffs into the lungs 2 (two) times daily. 06/05/13   Annia Belt, MD  busPIRone (BUSPAR) 10 MG tablet Take 10 mg by mouth 3 (three) times daily. 04/22/14   Historical Provider, MD  chlorpheniramine (CHLOR-TRIMETON) 4 MG tablet Take 1 tablet (4 mg total) by mouth every 8 (eight) hours as needed for allergies. 11/13/13   Dellia Nims, MD  diazepam (VALIUM) 2 MG tablet Take 1 tablet (2 mg total) by mouth every 12 (twelve) hours as needed for anxiety. 12/06/12   Wilber Oliphant, MD  diltiazem (TIAZAC) 420 MG 24 hr capsule Take 1 capsule (420 mg total) by mouth daily. 05/09/14 08/08/15  Bartholomew Crews, MD  FLUoxetine (PROZAC) 20 MG capsule TAKE THREE CAPSULES BY MOUTH ONCE DAILY Patient taking differently: Take 60 mg by mouth. TAKE THREE CAPSULES BY MOUTH ONCE DAILY 01/03/14   Dellia Nims, MD  guaiFENesin (MUCINEX) 600 MG 12 hr tablet Take 1 tablet (600 mg total) by mouth 2 (two) times daily. 04/28/14   Tanna Furry, MD  HYDROcodone-acetaminophen (NORCO) 7.5-325 MG per tablet Take 1-2 tablets by mouth every 6 (six) hours as needed for moderate pain. 06/22/14 07/17/15  Verlee Monte, MD  HYDROcodone-homatropine (HYCODAN) 5-1.5 MG/5ML syrup Take 5 mLs by mouth every 6 (six) hours as needed for cough. 04/30/14   Bartholomew Crews, MD  hydroxypropyl methylcellulose / hypromellose (ISOPTO TEARS / GONIOVISC) 2.5 % ophthalmic solution Place 1 drop into both eyes as needed for dry eyes.    Historical Provider, MD  levofloxacin (LEVAQUIN) 750 MG tablet Take 1 tablet (750 mg total) by mouth daily. 07/02/14   Arnoldo Morale, MD  metoprolol succinate (TOPROL-XL) 25 MG 24 hr tablet Take 0.5 tablets (12.5 mg total) by mouth daily after lunch. 07/02/14    Arnoldo Morale, MD  nystatin (MYCOSTATIN) 100000 UNIT/ML suspension Take 5 mLs (500,000 Units total) by mouth as needed. 04/16/14   Tasrif Ahmed, MD  pantoprazole (PROTONIX) 40 MG tablet TAKE ONE TABLET BY MOUTH TWICE DAILY 01/03/14   Dellia Nims, MD  predniSONE (STERAPRED UNI-PAK 21 TAB) 10 MG (21) TBPK tablet Take 6-5-4-3-2-1 tablet PO daily till gone Patient not taking: Reported on 07/02/2014 06/22/14   Verlee Monte, MD  tiotropium (SPIRIVA) 18 MCG inhalation capsule Place 1 capsule (18 mcg total) into inhaler and inhale daily. 07/05/13   Pollie Friar, MD  zolpidem (AMBIEN) 5 MG tablet Take 1 tablet (5 mg total) by mouth at bedtime as  needed for sleep. 03/09/14   Tasrif Ahmed, MD   BP 105/65 mmHg  Pulse 92  Temp(Src) 99.4 F (37.4 C) (Oral)  Resp 16  SpO2 92% Physical Exam  Constitutional: She is oriented to person, place, and time. She appears well-developed and well-nourished. No distress.  HENT:  Head: Normocephalic and atraumatic.  Eyes: Conjunctivae are normal. No scleral icterus.  Neck: Normal range of motion.  Cardiovascular: Normal rate, regular rhythm and normal heart sounds.  Exam reveals no gallop and no friction rub.   No murmur heard. Pulmonary/Chest: Effort normal. No respiratory distress. She has wheezes.  Abdominal: Soft. Bowel sounds are normal. She exhibits no distension and no mass. There is tenderness ( diffuse). There is no guarding.  Neurological: She is alert and oriented to person, place, and time.  Skin: Skin is warm and dry. She is not diaphoretic.  Nursing note and vitals reviewed.   ED Course  Procedures (including critical care time) Labs Review Labs Reviewed  CBC WITH DIFFERENTIAL/PLATELET  COMPREHENSIVE METABOLIC PANEL  LIPASE, BLOOD  I-STAT TROPOININ, ED    Imaging Review No results found.   EKG Interpretation None      MDM   Final diagnoses:  Cough  , diarrhea Hypokalemia  Patient chronically ill. Here with n/v/d. She is  incontinent of stool . She is having diffuse wheezing. I have ordered basic labs, lactate, CT abdomen, albuterol. . Patient with an oral temp of 99. 4.    8:44 PM Filed Vitals:   07/05/14 1813  BP: 105/65  Pulse: 92  Temp: 99.4 F (37.4 C)  Resp: 16   Patient with leukocytosis of 11,000. Hemoglobin slightly low. Her potassium is markedly low at 2.5. I have ordered for runs of IV potassium Patient continues to have diarrhea in the ED. she seen in shared visit with Dr. Leonides Schanz. She will be admitted for continued replacement of her potassium and monitoring.  Margarita Mail, PA-C 07/10/14 2013  [place], DO 07/11/14 [phone]

## 2014-07-06 ENCOUNTER — Telehealth: Payer: Self-pay | Admitting: Pulmonary Disease

## 2014-07-06 ENCOUNTER — Ambulatory Visit: Payer: Medicare Other | Admitting: Adult Health

## 2014-07-06 DIAGNOSIS — E876 Hypokalemia: Secondary | ICD-10-CM

## 2014-07-06 LAB — CBC
HCT: 36.2 % (ref 36.0–46.0)
Hemoglobin: 11.6 g/dL — ABNORMAL LOW (ref 12.0–15.0)
MCH: 31.2 pg (ref 26.0–34.0)
MCHC: 32 g/dL (ref 30.0–36.0)
MCV: 97.3 fL (ref 78.0–100.0)
Platelets: 228 10*3/uL (ref 150–400)
RBC: 3.72 MIL/uL — AB (ref 3.87–5.11)
RDW: 14.8 % (ref 11.5–15.5)
WBC: 7.7 10*3/uL (ref 4.0–10.5)

## 2014-07-06 LAB — LIPID PANEL
CHOL/HDL RATIO: 3.5 ratio
Cholesterol: 164 mg/dL (ref 0–200)
HDL: 47 mg/dL (ref >40)
LDL CALC: 99 mg/dL (ref 0–99)
Triglycerides: 89 mg/dL (ref <150)
VLDL: 18 mg/dL (ref 0–40)

## 2014-07-06 LAB — COMPREHENSIVE METABOLIC PANEL
ALK PHOS: 68 U/L (ref 38–126)
ALT: 10 U/L — ABNORMAL LOW (ref 14–54)
AST: 13 U/L — AB (ref 15–41)
Albumin: 3.3 g/dL — ABNORMAL LOW (ref 3.5–5.0)
Anion gap: 11 (ref 5–15)
BILIRUBIN TOTAL: 0.7 mg/dL (ref 0.3–1.2)
BUN: 14 mg/dL (ref 6–20)
CALCIUM: 8.4 mg/dL — AB (ref 8.9–10.3)
CHLORIDE: 98 mmol/L — AB (ref 101–111)
CO2: 30 mmol/L (ref 22–32)
Creatinine, Ser: 0.81 mg/dL (ref 0.44–1.00)
GFR calc Af Amer: >60 mL/min (ref >60)
GFR calc non Af Amer: >60 mL/min (ref >60)
Glucose, Bld: 145 mg/dL — ABNORMAL HIGH (ref 65–99)
Potassium: 3.4 mmol/L — ABNORMAL LOW (ref 3.5–5.1)
Sodium: 139 mmol/L (ref 135–145)
Total Protein: 6.5 g/dL (ref 6.5–8.1)

## 2014-07-06 LAB — TSH: TSH: 0.437 u[IU]/mL (ref 0.350–4.500)

## 2014-07-06 LAB — GLUCOSE, CAPILLARY: Glucose-Capillary: 129 mg/dL — ABNORMAL HIGH (ref 65–99)

## 2014-07-06 MED ORDER — LOPERAMIDE HCL 2 MG PO CAPS
2.0000 mg | ORAL_CAPSULE | ORAL | Status: DC | PRN
  Administered 2014-07-06 – 2014-07-07 (×4): 2 mg via ORAL
  Filled 2014-07-06 (×4): qty 1

## 2014-07-06 MED ORDER — POTASSIUM CHLORIDE 10 MEQ/100ML IV SOLN
INTRAVENOUS | Status: AC
  Administered 2014-07-06: 10 meq via INTRAVENOUS
  Filled 2014-07-06: qty 100

## 2014-07-06 MED ORDER — POTASSIUM CHLORIDE CRYS ER 20 MEQ PO TBCR
40.0000 meq | EXTENDED_RELEASE_TABLET | Freq: Once | ORAL | Status: AC
  Administered 2014-07-06: 40 meq via ORAL
  Filled 2014-07-06: qty 2

## 2014-07-06 MED ORDER — CETYLPYRIDINIUM CHLORIDE 0.05 % MT LIQD
7.0000 mL | Freq: Two times a day (BID) | OROMUCOSAL | Status: DC
  Administered 2014-07-06 – 2014-07-07 (×3): 7 mL via OROMUCOSAL

## 2014-07-06 MED ORDER — CHLORHEXIDINE GLUCONATE 0.12 % MT SOLN
15.0000 mL | Freq: Two times a day (BID) | OROMUCOSAL | Status: DC
  Administered 2014-07-06 – 2014-07-08 (×4): 15 mL via OROMUCOSAL
  Filled 2014-07-06 (×4): qty 15

## 2014-07-06 NOTE — Hospital Discharge Follow-Up (Signed)
Transitional Care Clinic Care Coordination Note:  Admit date:  07/05/14 Discharge date: TBD Discharge Disposition: Home with family and resumption of [facility] SN, PT  Patient contact: [phone] (cell) Emergency contact(s): Melvyn Novas ([phone])-[phone]  This Case Manager reviewed patient's EMR and determined patient would benefit from post-discharge medical management and chronic care management services through the [facility]. Patient has a history of COPD, HTN, CHF, Atrial fibrillation. Patient admitted with acute abdominal pain with nausea, nonbloody vomiting, and diarrhea as well as hypokalemia.  Patient has had 3 inpatient admissions and 1 ED visit in the last 6 months. This Case Manager met with patient to discuss the services and medical management that can be provided at the [facility]. Patient verbalized understanding and agreed to receive post-discharge care at the [facility].   Patient scheduled for Transitional Care appointment on 07/12/14 at 3:30.  Clinic information and appointment time provided to patient. Appointment information also placed on AVS.  Assessment:       Home Environment: Patient lives in a private, two story residence with her daughter and grand-daughter.  Patient's daughter and granddaughter assist patient with activities of daily living.        Support System: Family members       Level of functioning: Patient uses a walker primarily for mobility but does have an electric wheelchair for long distances.  Patient's daughter assists patient as needed with daily activities.        Home DME: home Oxygen with [facility], walker, electric wheelchair. Patient anticipated to need home nebulizer upon discharge.  RN CM indicates she is working on arrangements with [facility].       Home care services: Patient will return home with resumption of home care services with [name] [facility] RN and PT)       Transportation: Patient  uses [employer] or [employer] for transportation to medical appointments.  [place] arranged for appointment on 07/12/14.        Food/Nutrition: Patient indicates she has access to the food she needs. She has Physicist, medical. She indicates her daughter prepares meals, and she usually uses [employer] for transportation to the grocery store.         Medications: Patient obtains medications at [facility] on [address]. She indicates she can afford her prescribed medications. Discussed importance of medication compliance.                 PCP: Patient plans to establish care with [facility] provider after Transitional Care Clinic follow-up.             Arranged services:        Services communicated to Dessa Phi, RN Case Manager

## 2014-07-06 NOTE — Telephone Encounter (Signed)
FYI for you.

## 2014-07-06 NOTE — Progress Notes (Signed)
Patient ID: Kelsey Garcia, female   DOB: 1945-03-13, 69 y.o.   MRN: [id number]  [facility] PROGRESS NOTE  Kelsey Garcia:[id number] DOB: 05/13/1945 DOA: 07/05/2014 PCP: No PCP Per Patient   Brief narrative:    Patient is 69 year old female with known COPD, recent admission for acute respiratory failure due to influenza discharge on 06/21/2004, patient also with chronic diastolic CHF and last EF 19-50%, atrial fibrillation, not on AC due to non compliance, presented to [facility] emergency department with main concern of several days duration of progressively worsening nausea, nonbloody vomiting and nonbloody diarrhea. Patient said she was recently treated with Levaquin for bronchitis and COPD. She has denied fevers and chills, no specific urinary concerns. She reported generalized abdominal pain, throbbing and with cramps, 5 out of 10 in severity when present, no specific alleviating or aggravating factors, associated with symptoms above and poor oral intake.  Assessment/Plan:    Principal Problem:   Acute abdominal pain with nausea, nonbloody vomiting and diarrhea - Unclear etiology and possibly related to recent anti-biotic use, questionable viral gastroenteritis - C. difficile PCR negative, stool culture pending - Patient reports she feels better overall but still with persistent diarrhea, no vomiting - Continue supportive care, IV fluids, advance diet as patient able to tolerate - Provide analgesia as needed   Hypokalemia - Secondary to diarrhea and vomiting - Still slightly low, we'll continue to supplement and repeat BMP Active Problems:   COPD - Respiratory rate at target range and oxygen saturation at target range - No signs of extubation at this time   Essential hypertension - Reasonable inpatient control - Continue metoprolol and Cardizem   COPD (chronic obstructive pulmonary disease)   Chronic diastolic CHF (congestive heart failure)   Atrial fibrillation, CHADS2 =  2-3 - not on AC due to medical non compliance  - rate controlled on cardizem and metoprolol   ? Distal common duct strictures or noncalcified stones, cholelithiasis  - per CT abd causing mild dilatation of the gallbladder and bile ducts - pt reports this has been going on over 20 yrs, dose not want any intervention at this time    Obesity - Body mass index is 29.75 kg/(m^2).  DVT prophylaxis - Heparin SQ  Code Status: Full.  Family Communication:  plan of care discussed with the patient Disposition Plan: Home when stable.   IV access:  Peripheral IV  Procedures and diagnostic studies:    Dg Chest 2 View 07/05/2014  Improved aeration of the lungs without acute cardiopulmonary disease. 2. Cholelithiasis.     Ct Abdomen Pelvis W Contrast 07/05/2014  Probable distal common duct strictures or noncalcified stones causing mild dilatation of the gallbladder and bile ducts. 2. Cholelithiasis. 3. Sigmoid diverticulosis without evidence of diverticulitis. 4. 8 mm probable uterine fibroid. 5. Mild atheromatous coronary artery calcifications. 6. Resolved nodular infection at the lung bases. 7.  Medical Consultants:  None  Other Consultants:  None  IAnti-Infectives:   None   Faye Ramsay, MD  [facility] Pager [phone]  If 7PM-7AM, please contact night-coverage [email or web address] Password [facility] 07/06/2014, 10:57 AM   LOS: 1 day   HPI/Subjective: No events overnight. Reports persistent diarrhea but no vomiting.   Objective: Filed Vitals:   07/05/14 1813 07/05/14 2340 07/06/14 0547 07/06/14 0906  BP: 105/65 108/50 103/61   Pulse: 92 115 85   Temp: 99.4 F (37.4 C) 99 F (37.2 C) 98.4 F (36.9 C)   TempSrc: Oral Oral Oral   Resp: 16 20  18   Height:  '5\' 5"'$  (1.651 m)    Weight:  81.1 kg (178 lb 12.7 oz)    SpO2: 92% 90% 100% 98%    Intake/Output Summary (Last 24 hours) at 07/06/14 1057 Last data filed at 07/06/14 0806  Gross per 24 hour  Intake 1293.33 ml  Output      0 ml   Net 1293.33 ml    Exam:   General:  Pt is alert, follows commands appropriately, not in acute distress  Cardiovascular: Regular rate and rhythm, no rubs, no gallops  Respiratory: Clear to auscultation bilaterally, no wheezing, no crackles, no rhonchi  Abdomen: Soft, non tender, non distended, bowel sounds present, no guarding  Extremities: No edema, pulses DP and PT palpable bilaterally  Neuro: Grossly nonfocal  Data Reviewed: Basic Metabolic Panel:  Recent Labs Lab 07/05/14 1909 07/06/14 0700  NA 141 139  K 2.5* 3.4*  CL 95* 98*  CO2 34* 30  GLUCOSE 125* 145*  BUN 12 14  CREATININE 0.93 0.81  CALCIUM 8.4* 8.4*   Liver Function Tests:  Recent Labs Lab 07/05/14 1909 07/06/14 0700  AST 12* 13*  ALT 12* 10*  ALKPHOS 75 68  BILITOT 0.6 0.7  PROT 6.2* 6.5  ALBUMIN 3.2* 3.3*    Recent Labs Lab 07/05/14 1909  LIPASE 11*   CBC:  Recent Labs Lab 07/05/14 1909 07/06/14 0700  WBC 11.8* 7.7  NEUTROABS 9.7*  --   HGB 11.8* 11.6*  HCT 38.3 36.2  MCV 98.0 97.3  PLT 201 228   CBG:  Recent Labs Lab 07/06/14 0734  GLUCAP 129*    Recent Results (from the past 240 hour(s))  Clostridium Difficile by PCR     Status: None   Collection Time: 07/05/14  6:44 PM  Result Value Ref Range Status   C difficile by pcr NEGATIVE NEGATIVE Final     Scheduled Meds: . antiseptic oral rinse  7 mL Mouth Rinse BID  . aspirin EC  650 mg Oral Daily  . budesonide-formoterol  2 puff Inhalation BID  . busPIRone  10 mg Oral TID  . diltiazem  420 mg Oral Daily  . FLUoxetine  40 mg Oral Daily  . folic acid  1 mg Oral Daily  . heparin  5,000 Units Subcutaneous 3 times per day  . metoprolol succinate  12.5 mg Oral QPC lunch  . multivitamin with minerals  1 tablet Oral Daily  . pantoprazole  40 mg Oral Daily  . sodium chloride  3 mL Intravenous Q12H  . thiamine  100 mg Oral Daily  . tiotropium  18 mcg Inhalation Daily   Continuous Infusions: . sodium chloride 50 mL/hr  at 07/06/14 0008

## 2014-07-06 NOTE — Care Management Note (Signed)
Case Management Note  Patient Details  Name: RENAI LOPATA MRN: [id number] Date of Birth: 05-18-1945  Subjective/Objective:68 y/o f admitted w/n/v/d. Readmit 5/10-5/13-copd.Active w/[facility]. Will need neb machine, & inhalers.                   Action/Plan:d/c plan home [place].Will have [facility] to screen.   Expected Discharge Date:                  Expected Discharge Plan:  [place]  In-House Referral:     Discharge planning Services  CM Consult  Post Acute Care Choice:  Home Health [facility] Care-HHRN/HHPT) Choice offered to:     DME Arranged:    DME Agency:     HH Arranged:    HH Agency:     Status of Service:  In process, will continue to follow  Medicare Important Message Given:    Date Medicare IM Given:    Medicare IM give by:    Date Additional Medicare IM Given:    Additional Medicare Important Message give by:     If discussed at [place] of Stay Meetings, dates discussed:    Additional Comments:  Dessa Phi, RN 07/06/2014, 3:35 PM

## 2014-07-07 ENCOUNTER — Encounter (HOSPITAL_COMMUNITY): Payer: Self-pay | Admitting: Radiology

## 2014-07-07 ENCOUNTER — Inpatient Hospital Stay (HOSPITAL_COMMUNITY): Payer: Medicare Other

## 2014-07-07 LAB — BASIC METABOLIC PANEL
Anion gap: 6 (ref 5–15)
BUN: 16 mg/dL (ref 6–20)
CALCIUM: 8.3 mg/dL — AB (ref 8.9–10.3)
CO2: 30 mmol/L (ref 22–32)
Chloride: 103 mmol/L (ref 101–111)
Creatinine, Ser: 0.7 mg/dL (ref 0.44–1.00)
GFR calc Af Amer: >60 mL/min (ref >60)
GFR calc non Af Amer: >60 mL/min (ref >60)
GLUCOSE: 117 mg/dL — AB (ref 65–99)
Potassium: 3.8 mmol/L (ref 3.5–5.1)
Sodium: 139 mmol/L (ref 135–145)

## 2014-07-07 LAB — HEMOGLOBIN A1C
Hgb A1c MFr Bld: 5.4 % (ref 4.8–5.6)
MEAN PLASMA GLUCOSE: 108 mg/dL

## 2014-07-07 LAB — CBC
HCT: 33.5 % — ABNORMAL LOW (ref 36.0–46.0)
Hemoglobin: 10.2 g/dL — ABNORMAL LOW (ref 12.0–15.0)
MCH: 30.3 pg (ref 26.0–34.0)
MCHC: 30.4 g/dL (ref 30.0–36.0)
MCV: 99.4 fL (ref 78.0–100.0)
Platelets: 201 10*3/uL (ref 150–400)
RBC: 3.37 MIL/uL — ABNORMAL LOW (ref 3.87–5.11)
RDW: 15.1 % (ref 11.5–15.5)
WBC: 7.4 10*3/uL (ref 4.0–10.5)

## 2014-07-07 LAB — GLUCOSE, CAPILLARY: Glucose-Capillary: 88 mg/dL (ref 65–99)

## 2014-07-07 MED ORDER — METHYLPREDNISOLONE SODIUM SUCC 40 MG IJ SOLR
40.0000 mg | Freq: Four times a day (QID) | INTRAMUSCULAR | Status: DC
  Administered 2014-07-07 – 2014-07-08 (×5): 40 mg via INTRAVENOUS
  Filled 2014-07-07 (×6): qty 1

## 2014-07-07 MED ORDER — GUAIFENESIN ER 600 MG PO TB12
600.0000 mg | ORAL_TABLET | Freq: Two times a day (BID) | ORAL | Status: DC
  Administered 2014-07-07 – 2014-07-08 (×3): 600 mg via ORAL
  Filled 2014-07-07 (×4): qty 1

## 2014-07-07 MED ORDER — GUAIFENESIN 100 MG/5ML PO SOLN
200.0000 mg | ORAL | Status: DC | PRN
  Filled 2014-07-07: qty 10

## 2014-07-07 NOTE — Progress Notes (Addendum)
Patient ID: Kelsey Garcia, female   DOB: 1945-09-24, 69 y.o.   MRN: [id number]  [facility] PROGRESS NOTE  Kelsey Garcia GGY:[id number] DOB: 08/30/1945 DOA: 07/05/2014 PCP: No PCP Per Patient   Brief narrative:    Patient is 69 year old female with known COPD, recent admission for acute respiratory failure due to influenza discharge on 06/21/2004, patient also with chronic diastolic CHF and last EF 03-50%, atrial fibrillation, not on AC due to non compliance, presented to [facility] emergency department with main concern of several days duration of progressively worsening nausea, nonbloody vomiting and nonbloody diarrhea. Patient said she was recently treated with Levaquin for bronchitis and COPD. She has denied fevers and chills, no specific urinary concerns. She reported generalized abdominal pain, throbbing and with cramps, 5 out of 10 in severity when present, no specific alleviating or aggravating factors, associated with symptoms above and poor oral intake.  Assessment/Plan:    Principal Problem:   Acute abdominal pain with nausea, nonbloody vomiting and diarrhea - Unclear etiology and possibly related to recent anti-biotic use, questionable viral gastroenteritis - C. difficile PCR negative, stool culture still pending, pt says diarrhea is improving but is still present  - Patient denies vomiting  - Continue supportive care, advance diet as patient able to tolerate - Provide analgesia as needed   Hypokalemia - Secondary to diarrhea and vomiting - supplemented and WNL this AM - repeat BMP in AM   Mixed productive and non productive cough, acute on chronic - CT chest done this AM, no signs of acute pulmonary etiology  - continue antitussives as needed  Active Problems:   COPD - Respiratory rate at target range and oxygen saturation at target range - No signs of extubation at this time   Essential hypertension - Reasonable inpatient control - Continue metoprolol and Cardizem    COPD (chronic obstructive pulmonary disease) - no wheezing on exam but cough is present  - maintaining oxygen saturation at target range    Chronic diastolic CHF (congestive heart failure) - no sings of volume overload - weight is 82.6 kg this AM - monitor daily weights, strict I/O   Atrial fibrillation, CHADS2 = 2-3 - not on AC due to medical non compliance  - rate controlled on cardizem and metoprolol   ? Distal common duct strictures or noncalcified stones, cholelithiasis  - per CT abd causing mild dilatation of the gallbladder and bile ducts - pt reports this has been going on over 20 yrs, dose not want any intervention at this time    Obesity - Body mass index is 30.32 kg/(m^2).  DVT prophylaxis - Heparin SQ  Code Status: Full.  Family Communication:  plan of care discussed with the patient Disposition Plan: Not ready for d/c at this time but when ready, will go home.   IV access:  Peripheral IV  Procedures and diagnostic studies:    Dg Chest 2 View 07/05/2014  Improved aeration of the lungs without acute cardiopulmonary disease. 2. Cholelithiasis.     Ct Abdomen Pelvis W Contrast 07/05/2014  Probable distal common duct strictures or noncalcified stones causing mild dilatation of the gallbladder and bile ducts. 2. Cholelithiasis. 3. Sigmoid diverticulosis without evidence of diverticulitis. 4. 8 mm probable uterine fibroid. 5. Mild atheromatous coronary artery calcifications. 6. Resolved nodular infection at the lung bases.  Ct Chest Wo Contrast 07/07/2014   Near complete resolution of patient's previously noted moderate bilateral nodular airspace process with only minimal residual disease over the right upper  lobe and faintly in the lower lobes likely resolving infectious process. Minimal reactive mediastinal adenopathy unchanged.  Subtle atherosclerotic coronary artery disease.   Medical Consultants:  None  Other Consultants:  None  IAnti-Infectives:   None    Faye Ramsay, MD  [facility] Pager [phone]  If 7PM-7AM, please contact night-coverage [email or web address] Password [facility] 07/07/2014, 1:54 PM   LOS: 2 days   HPI/Subjective: No events overnight. Reports persistent mixed productive and non productive cough.   Objective: Filed Vitals:   07/07/14 0517 07/07/14 0926 07/07/14 0936 07/07/14 1154  BP: 130/67  135/62 140/66  Pulse: 88   80  Temp: 98.3 F (36.8 C)     TempSrc: Oral     Resp: 18     Height:      Weight: 82.645 kg (182 lb 3.2 oz)     SpO2: 100% 96%      Intake/Output Summary (Last 24 hours) at 07/07/14 1354 Last data filed at 07/07/14 0840  Gross per 24 hour  Intake   2330 ml  Output    100 ml  Net   2230 ml    Exam:   General:  Pt is alert, follows commands appropriately, in mild distress due to cough   Cardiovascular: Regular rate and rhythm, no rubs, no gallops  Respiratory: Clear to auscultation bilaterally, no wheezing, scattered rhonchi bilaterally with cough   Abdomen: Soft, non tender, non distended, bowel sounds present, no guarding  Extremities: No edema, pulses DP and PT palpable bilaterally  Neuro: Grossly nonfocal  Data Reviewed: Basic Metabolic Panel:  Recent Labs Lab 07/05/14 1909 07/06/14 0700 07/07/14 0407  NA 141 139 139  K 2.5* 3.4* 3.8  CL 95* 98* 103  CO2 34* 30 30  GLUCOSE 125* 145* 117*  BUN '12 14 16  '$ CREATININE 0.93 0.81 0.70  CALCIUM 8.4* 8.4* 8.3*   Liver Function Tests:  Recent Labs Lab 07/05/14 1909 07/06/14 0700  AST 12* 13*  ALT 12* 10*  ALKPHOS 75 68  BILITOT 0.6 0.7  PROT 6.2* 6.5  ALBUMIN 3.2* 3.3*    Recent Labs Lab 07/05/14 1909  LIPASE 11*   CBC:  Recent Labs Lab 07/05/14 1909 07/06/14 0700 07/07/14 0407  WBC 11.8* 7.7 7.4  NEUTROABS 9.7*  --   --   HGB 11.8* 11.6* 10.2*  HCT 38.3 36.2 33.5*  MCV 98.0 97.3 99.4  PLT 201 228 201   CBG:  Recent Labs Lab 07/06/14 0734 07/07/14 0756  GLUCAP 129* 88    Recent Results (from  the past 240 hour(s))  Clostridium Difficile by PCR     Status: None   Collection Time: 07/05/14  6:44 PM  Result Value Ref Range Status   C difficile by pcr NEGATIVE NEGATIVE Final     Scheduled Meds: . antiseptic oral rinse  7 mL Mouth Rinse q12n4p  . aspirin EC  650 mg Oral Daily  . budesonide-formoterol  2 puff Inhalation BID  . busPIRone  10 mg Oral TID  . chlorhexidine  15 mL Mouth Rinse BID  . diltiazem  420 mg Oral Daily  . FLUoxetine  40 mg Oral Daily  . folic acid  1 mg Oral Daily  . heparin  5,000 Units Subcutaneous 3 times per day  . methylPREDNISolone (SOLU-MEDROL) injection  40 mg Intravenous 4 times per day  . metoprolol succinate  12.5 mg Oral QPC lunch  . multivitamin with minerals  1 tablet Oral Daily  . pantoprazole  40 mg Oral Daily  .  sodium chloride  3 mL Intravenous Q12H  . thiamine  100 mg Oral Daily  . tiotropium  18 mcg Inhalation Daily   Continuous Infusions: . sodium chloride 50 mL/hr at 07/06/14 2022

## 2014-07-07 NOTE — Telephone Encounter (Signed)
Pl arrange FU after hosp dc

## 2014-07-08 LAB — BASIC METABOLIC PANEL
ANION GAP: 7 (ref 5–15)
BUN: 18 mg/dL (ref 6–20)
CALCIUM: 8.5 mg/dL — AB (ref 8.9–10.3)
CHLORIDE: 106 mmol/L (ref 101–111)
CO2: 28 mmol/L (ref 22–32)
Creatinine, Ser: 0.59 mg/dL (ref 0.44–1.00)
GFR calc Af Amer: >60 mL/min (ref >60)
GFR calc non Af Amer: >60 mL/min (ref >60)
Glucose, Bld: 169 mg/dL — ABNORMAL HIGH (ref 65–99)
Potassium: 4 mmol/L (ref 3.5–5.1)
Sodium: 141 mmol/L (ref 135–145)

## 2014-07-08 LAB — GLUCOSE, CAPILLARY: GLUCOSE-CAPILLARY: 128 mg/dL — AB (ref 65–99)

## 2014-07-08 LAB — CBC
HEMATOCRIT: 35.3 % — AB (ref 36.0–46.0)
Hemoglobin: 10.8 g/dL — ABNORMAL LOW (ref 12.0–15.0)
MCH: 30.9 pg (ref 26.0–34.0)
MCHC: 30.6 g/dL (ref 30.0–36.0)
MCV: 101.1 fL — ABNORMAL HIGH (ref 78.0–100.0)
Platelets: 226 10*3/uL (ref 150–400)
RBC: 3.49 MIL/uL — AB (ref 3.87–5.11)
RDW: 15.2 % (ref 11.5–15.5)
WBC: 6.2 10*3/uL (ref 4.0–10.5)

## 2014-07-08 MED ORDER — HYDROCODONE-ACETAMINOPHEN 7.5-325 MG PO TABS: 1.0000 | ORAL_TABLET | Freq: Four times a day (QID) | ORAL | Status: DC | PRN

## 2014-07-08 MED ORDER — PREDNISONE 10 MG PO TABS: ORAL_TABLET | ORAL | Status: DC

## 2014-07-08 MED ORDER — ALBUTEROL SULFATE (2.5 MG/3ML) 0.083% IN NEBU: 2.5000 mg | INHALATION_SOLUTION | Freq: Four times a day (QID) | RESPIRATORY_TRACT | Status: DC | PRN

## 2014-07-08 NOTE — Care Management Note (Signed)
Case Management Note  Patient Details  Name: Kelsey Garcia MRN: [id number] Date of Birth: 1945/03/09  Subjective/Objective:                COPD    Action/Plan: Lives at home  Expected Discharge Date:    07/08/2014             Expected Discharge Plan:  [place]  In-House Referral:     Discharge planning Services  CM Consult  Post Acute Care Choice:  [facility] Care-HHRN/HHPT) Choice offered to:     DME Arranged:  Nebulizer machine DME Agency:  [place]:  RN, PT, OT [facility] Agency:  [place]  Status of Service:  Completed, signed off  Medicare Important Message Given:  Yes Date Medicare IM Given:  07/08/14 Medicare IM give by:  Jonnie Finner RN  Date Additional Medicare IM Given:    Additional Medicare Important Message give by:     If discussed at [place] of Stay Meetings, dates discussed:    Additional Comments: NCM spoke to pt and offered choice for [facility]. Pt is active with [facility] for [facility]. Pt requesting neb machine and portable oxygen tank. Notified [facility] for DME for home. Contacted [facility] and requesting [place] orders faxed to [phone]. Erenest Rasher, RN 07/08/2014, 11:17 AM

## 2014-07-08 NOTE — Clinical Social Work Note (Signed)
CSW received a call from RN requesting taxi voucher for pt discharge home today  CSW met with pt, confirmed address and provided voucher to RN  .Dede Query, LCSW [facility] Clinical Social Worker - Weekend Coverage cell #: [phone]

## 2014-07-08 NOTE — Discharge Summary (Signed)
Physician Discharge Summary  Kelsey Garcia UVO:[id number] DOB: 1945/08/22 DOA: 07/05/2014  PCP: [place] date: 07/05/2014 Discharge date: 07/08/2014  Recommendations for Outpatient Follow-up:  1. Pt will need to follow up with PCP in 2-3 weeks post discharge 2. Please obtain BMP to evaluate electrolytes and kidney function  Discharge Diagnoses:  Principal Problem:   Hypokalemia Active Problems:   HLD (hyperlipidemia)   Essential hypertension   COPD (chronic obstructive pulmonary disease)   Chronic diastolic CHF (congestive heart failure)   Atrial fibrillation   Generalized abdominal pain   Chronic respiratory failure with hypoxia  Discharge Condition: Stable  Diet recommendation: Heart healthy diet discussed in details    Brief narrative:    Patient is 69 year old female with known COPD, recent admission for acute respiratory failure due to influenza discharge on 06/21/2004, patient also with chronic diastolic CHF and last EF 74-25%, atrial fibrillation, not on AC due to non compliance, presented to [facility] emergency department with main concern of several days duration of progressively worsening nausea, nonbloody vomiting and nonbloody diarrhea. Patient said she was recently treated with Levaquin for bronchitis and COPD. She has denied fevers and chills, no specific urinary concerns. She reported generalized abdominal pain, throbbing and with cramps, 5 out of 10 in severity when present, no specific alleviating or aggravating factors, associated with symptoms above and poor oral intake.  Assessment/Plan:    Principal Problem:  Acute abdominal pain with nausea, nonbloody vomiting and diarrhea - Unclear etiology and possibly related to recent anti-biotic use, questionable viral gastroenteritis - C. difficile PCR negative, stool culture so far with no growth to date  - Patient denies vomiting  - pt tolerating regular diet well    Hypokalemia -  Secondary to diarrhea and vomiting - supplemented and WNL this AM   Mixed productive and non productive cough, acute on chronic - CT chest done this AM, no signs of acute pulmonary etiology  - continue antitussives as needed   Active Problems:  COPD - Respiratory rate at target range and oxygen saturation at target range - No signs of exacerbation at this time    Essential hypertension - Reasonable inpatient control - Continue metoprolol and Cardizem   Chronic diastolic CHF (congestive heart failure) - no sings of volume overload - weight is 83 kg this AM   Atrial fibrillation, CHADS2 = 2-3 - not on AC due to medical non compliance  - rate controlled on cardizem and metoprolol   ? Distal common duct strictures or noncalcified stones, cholelithiasis  - per CT abd causing mild dilatation of the gallbladder and bile ducts - pt reports this has been going on over 20 yrs, dose not want any intervention at this time    Obesity - Body mass index is 30.32 kg/(m^2).  Code Status: Full.  Family Communication: plan of care discussed with the patient Disposition Plan: Home   IV access:  Peripheral IV  Procedures and diagnostic studies:   Dg Chest 2 View 07/05/2014 Improved aeration of the lungs without acute cardiopulmonary disease. 2. Cholelithiasis.   Ct Abdomen Pelvis W Contrast 07/05/2014 Probable distal common duct strictures or noncalcified stones causing mild dilatation of the gallbladder and bile ducts. 2. Cholelithiasis. 3. Sigmoid diverticulosis without evidence of diverticulitis. 4. 8 mm probable uterine fibroid. 5. Mild atheromatous coronary artery calcifications. 6. Resolved nodular infection at the lung bases.  Ct Chest Wo Contrast 07/07/2014 Near complete resolution of patient's previously noted moderate bilateral nodular airspace process  with only minimal residual disease over the right upper lobe and faintly in the lower lobes likely resolving  infectious process. Minimal reactive mediastinal adenopathy unchanged. Subtle atherosclerotic coronary artery disease.   Medical Consultants:  None  Other Consultants:  None  IAnti-Infectives:   None      Discharge Exam: Filed Vitals:   07/08/14 0526  BP: 105/73  Pulse: 88  Temp: 98.2 F (36.8 C)  Resp: 20   Filed Vitals:   07/07/14 2108 07/07/14 2113 07/08/14 0526 07/08/14 0802  BP:  121/67 105/73   Pulse:  87 88   Temp:  97.9 F (36.6 C) 98.2 F (36.8 C)   TempSrc:  Oral Oral   Resp:  20 20   Height:      Weight:   83.462 kg (184 lb)   SpO2: 97% 98% 97% 97%    General: Pt is alert, follows commands appropriately, not in acute distress Cardiovascular: Regular rate and rhythm, S1/S2 +, no murmurs, no rubs, no gallops Respiratory: Clear to auscultation bilaterally, no wheezing, diminished breath sounds at bases  Abdominal: Soft, non tender, non distended, bowel sounds +, no guarding Extremities: no edema, no cyanosis, pulses palpable bilaterally DP and PT Neuro: Grossly nonfocal  Discharge Instructions  Discharge Instructions    Diet - low sodium heart healthy    Complete by:  As directed      Increase activity slowly    Complete by:  As directed             Medication List    STOP taking these medications        antiseptic oral rinse Liqd     levofloxacin 750 MG tablet  Commonly known as:  LEVAQUIN     predniSONE 10 MG (21) Tbpk tablet  Commonly known as:  STERAPRED UNI-PAK 21 TAB  Replaced by:  predniSONE 10 MG tablet     zolpidem 5 MG tablet  Commonly known as:  AMBIEN      TAKE these medications        acetaminophen-codeine 300-30 MG per tablet  Commonly known as:  TYLENOL #3  Take 1 tablet by mouth every 6 (six) hours as needed for moderate pain.     albuterol 108 (90 BASE) MCG/ACT inhaler  Commonly known as:  PROVENTIL HFA;VENTOLIN HFA  Inhale 1-2 puffs into the lungs every 6 (six) hours as needed for wheezing.      albuterol (2.5 MG/3ML) 0.083% nebulizer solution  Commonly known as:  PROVENTIL  Take 3 mLs (2.5 mg total) by nebulization every 6 (six) hours as needed for wheezing.     aspirin 325 MG tablet  Take 650 mg by mouth daily.     budesonide-formoterol 160-4.5 MCG/ACT inhaler  Commonly known as:  SYMBICORT  Inhale 2 puffs into the lungs 2 (two) times daily.     busPIRone 10 MG tablet  Commonly known as:  BUSPAR  Take 10 mg by mouth 3 (three) times daily.     chlorpheniramine 4 MG tablet  Commonly known as:  CHLOR-TRIMETON  Take 1 tablet (4 mg total) by mouth every 8 (eight) hours as needed for allergies.     diazepam 2 MG tablet  Commonly known as:  VALIUM  Take 1 tablet (2 mg total) by mouth every 12 (twelve) hours as needed for anxiety.     diltiazem 420 MG 24 hr capsule  Commonly known as:  TIAZAC  Take 1 capsule (420 mg total) by mouth daily.  FLUoxetine 20 MG capsule  Commonly known as:  PROZAC  TAKE THREE CAPSULES BY MOUTH ONCE DAILY     guaiFENesin 600 MG 12 hr tablet  Commonly known as:  MUCINEX  Take 1 tablet (600 mg total) by mouth 2 (two) times daily.     HYDROcodone-acetaminophen 7.5-325 MG per tablet  Commonly known as:  NORCO  Take 1-2 tablets by mouth every 6 (six) hours as needed for moderate pain.     HYDROcodone-homatropine 5-1.5 MG/5ML syrup  Commonly known as:  HYCODAN  Take 5 mLs by mouth every 6 (six) hours as needed for cough.     hydroxypropyl methylcellulose / hypromellose 2.5 % ophthalmic solution  Commonly known as:  ISOPTO TEARS / GONIOVISC  Place 1 drop into both eyes daily as needed for dry eyes (dry eyes).     metoprolol succinate 25 MG 24 hr tablet  Commonly known as:  TOPROL-XL  Take 0.5 tablets (12.5 mg total) by mouth daily after lunch.     nystatin 100000 UNIT/ML suspension  Commonly known as:  MYCOSTATIN  Take 5 mLs (500,000 Units total) by mouth as needed.     pantoprazole 40 MG tablet  Commonly known as:  PROTONIX  TAKE ONE  TABLET BY MOUTH TWICE DAILY     predniSONE 10 MG tablet  Commonly known as:  DELTASONE  Take 50 mg tablet today and taper down by 10 mg daily until completed     tiotropium 18 MCG inhalation capsule  Commonly known as:  SPIRIVA  Place 1 capsule (18 mcg total) into inhaler and inhale daily.           Follow-up Information    Follow up with [facility]     On 07/12/2014.   Why:  [facility] Clinic appointment on 07/12/14 at 3:30pm with Dr. Doreene Burke.   Contact information:   [address] [place] [address] [phone]       The results of significant diagnostics from this hospitalization (including imaging, microbiology, ancillary and laboratory) are listed below for reference.     Microbiology: Recent Results (from the past 240 hour(s))  Stool culture     Status: None (Preliminary result)   Collection Time: 07/05/14  6:44 PM  Result Value Ref Range Status   Specimen Description STOOL  Final   Special Requests NONE  Final   Culture   Final    NO SUSPICIOUS COLONIES, CONTINUING TO HOLD Note: REDUCED NORMAL FLORA PRESENT Performed at [facility]    Report Status PENDING  Incomplete  Clostridium Difficile by PCR     Status: None   Collection Time: 07/05/14  6:44 PM  Result Value Ref Range Status   C difficile by pcr NEGATIVE NEGATIVE Final     Labs: Basic Metabolic Panel:  Recent Labs Lab 07/05/14 1909 07/06/14 0700 07/07/14 0407 07/08/14 0501  NA 141 139 139 141  K 2.5* 3.4* 3.8 4.0  CL 95* 98* 103 106  CO2 34* '30 30 28  '$ GLUCOSE 125* 145* 117* 169*  BUN '12 14 16 18  '$ CREATININE 0.93 0.81 0.70 0.59  CALCIUM 8.4* 8.4* 8.3* 8.5*   Liver Function Tests:  Recent Labs Lab 07/05/14 1909 07/06/14 0700  AST 12* 13*  ALT 12* 10*  ALKPHOS 75 68  BILITOT 0.6 0.7  PROT 6.2* 6.5  ALBUMIN 3.2* 3.3*    Recent Labs Lab 07/05/14 1909  LIPASE 11*   No results for input(s): AMMONIA  in the last  168 hours. CBC:  Recent Labs Lab 07/05/14 1909 07/06/14 0700 07/07/14 0407 07/08/14 0501  WBC 11.8* 7.7 7.4 6.2  NEUTROABS 9.7*  --   --   --   HGB 11.8* 11.6* 10.2* 10.8*  HCT 38.3 36.2 33.5* 35.3*  MCV 98.0 97.3 99.4 101.1*  PLT 201 228 201 226    BNP (last 3 results)  Recent Labs  05/16/14 1055 06/19/14 1032 06/19/14 1745  BNP 145.0* 418.5* 441.4*    CBG:  Recent Labs Lab 07/06/14 0734 07/07/14 0756 07/08/14 0713  GLUCAP 129* 88 128*     SIGNED: Time coordinating discharge: 30 minutes  MAGICK-Adenike Shidler, MD  [facility] 07/08/2014, 9:00 AM Pager [phone]  If 7PM-7AM, please contact night-coverage [email or web address] Password [email or web address]

## 2014-07-08 NOTE — Discharge Instructions (Signed)
Cough, Adult   A cough is a reflex. It helps you clear your throat and airways. A cough can help heal your body. A cough can last 2 or 3 weeks (acute) or may last more than 8 weeks (chronic). Some common causes of a cough can include an infection, allergy, or a cold.  HOME CARE  · Only take medicine as told by your doctor.  · If given, take your medicines (antibiotics) as told. Finish them even if you start to feel better.  · Use a cold steam vaporizer or humidifier in your home. This can help loosen thick spit (secretions).  · Sleep so you are almost sitting up (semi-upright). Use pillows to do this. This helps reduce coughing.  · Rest as needed.  · Stop smoking if you smoke.  GET HELP RIGHT AWAY IF:  · You have yellowish-white fluid (pus) in your thick spit.  · Your cough gets worse.  · Your medicine does not reduce coughing, and you are losing sleep.  · You cough up blood.  · You have trouble breathing.  · Your pain gets worse and medicine does not help.  · You have a fever.  MAKE SURE YOU:   · Understand these instructions.  · Will watch your condition.  · Will get help right away if you are not doing well or get worse.  Document Released: 10/09/2010 Document Revised: 06/12/2013 Document Reviewed: 10/09/2010  ExitCare® Patient Information ©2015 [employer]. This information is not intended to replace advice given to you by your health care provider. Make sure you discuss any questions you have with your health care provider.

## 2014-07-09 LAB — STOOL CULTURE

## 2014-07-10 ENCOUNTER — Telehealth: Payer: Self-pay

## 2014-07-10 NOTE — Telephone Encounter (Signed)
Called patient to check on her status and to confirm her appointment at the [facility] at the [facility] on 07/12/14. Voice mail message left requesting a call back.

## 2014-07-10 NOTE — Telephone Encounter (Signed)
Pt scheduled already for 6/21 with RA.  Nothing further needed.

## 2014-07-11 ENCOUNTER — Telehealth: Payer: Self-pay

## 2014-07-11 NOTE — Telephone Encounter (Signed)
Transitional Care Clinic Post-discharge Follow-Up Phone Call:  Date of Discharge:07/08/2014 Principal Discharge Diagnosis(es):hypokalemia, COPD, CHF Post-discharge Communication: Phone call to patient Call Completed: Y                      With Whom: Patient Interpreter Needed:   N               Language/Dialect:     Please check all that apply:  X Patient is knowledgeable of his/her condition(s) and/or treatment. X Patient is caring for self at home. - assistance from family when needed  ? Patient is receiving assist at home from family and/or caregiver. Family and/or caregiver is knowledgeable of patient's condition(s) and/or treatment. X Patient is receiving home health services. If so, name of agency.  [facility] : skillled nursing and P.T.  She said that she doesn't need a home health aide.      Medication Reconciliation:  X Medication list reviewed with patient. - Instructed her to bring all of her meds to her appointment tomorrow for review. She noted that her list is extensive.  X Patient obtained all discharge medications. YES   Activities of Daily Living:  X Independent - family available to assist when needed.  ? Needs assist (describe; ? home DME used) ? [facility] (describe, ? home DME used)   Community resources in place for patient:  ? None  X Home Health/Home DME ? Assisted Living ? Support Group          Patient Education:Pharmacy services at [facility], nebulizer use.        Questions/Concerns discussed: Patient stated that she received her nebulizer and knows how to use it. Currently doing nebulizer treatments 1-2x/day. She said that she has portable oxygen.  She is very concerned about pain management and receiving the needed follow up for pain control because of her her long term use of pain meds and fear of withdrawal. Confirmed her appointment in the [facility] for 07/12/14 @ 3:30PM.  She also confirmed that she has transportation scheduled for the  appointment.

## 2014-07-12 ENCOUNTER — Ambulatory Visit: Payer: Medicare Other | Attending: Internal Medicine | Admitting: Internal Medicine

## 2014-07-12 ENCOUNTER — Encounter: Payer: Self-pay | Admitting: Internal Medicine

## 2014-07-12 VITALS — BP 116/77 | HR 89 | Temp 98.5°F | Ht 66.0 in | Wt 188.0 lb

## 2014-07-12 DIAGNOSIS — F1120 Opioid dependence, uncomplicated: Secondary | ICD-10-CM

## 2014-07-12 DIAGNOSIS — G894 Chronic pain syndrome: Secondary | ICD-10-CM | POA: Diagnosis not present

## 2014-07-12 DIAGNOSIS — F1920 Other psychoactive substance dependence, uncomplicated: Secondary | ICD-10-CM | POA: Diagnosis not present

## 2014-07-12 DIAGNOSIS — I10 Essential (primary) hypertension: Secondary | ICD-10-CM | POA: Diagnosis not present

## 2014-07-12 NOTE — Progress Notes (Signed)
Patient here to establish care Patient recently hospitalized for hypokalemia (K was 2.5) and allergic reaction to tylenol 3 Patient received potassium replacement Patient been on 2L O2 for five years Patient states she has chronic pain in the right shoulder and right hip She states Dr. Lynnae January "fired her" from the Internal Medicine clinic at [facility] because "she was a threat to other patients." Patient has appointment at [facility] pain clinic soon Patient is requesting pain medication-non codeine based Pap smear 35 years ago Patient has fibroid on uterus that was discovered in hospital Mammogram normal last year No colonoscopy
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
## 2014-07-13 ENCOUNTER — Telehealth: Payer: Self-pay

## 2014-07-13 NOTE — Telephone Encounter (Signed)
Received message from refill team that pt was asking what does of potassium she is to take. Pt was recently hospitalized for hypokalemia. Cardiology did not see pt.  I spoke with patient and asked her to contact primary care (saw them yesterday) to determine potassium dosage. Pt states she will call them

## 2014-07-15 ENCOUNTER — Emergency Department (HOSPITAL_COMMUNITY): Payer: Medicare Other

## 2014-07-15 ENCOUNTER — Emergency Department (HOSPITAL_COMMUNITY)
Admission: EM | Admit: 2014-07-15 | Discharge: 2014-07-15 | Disposition: A | Discharge status: Home / Self Care | Payer: Medicare Other | Attending: Emergency Medicine | Admitting: Emergency Medicine

## 2014-07-15 ENCOUNTER — Encounter (HOSPITAL_COMMUNITY): Payer: Self-pay | Admitting: Emergency Medicine

## 2014-07-15 DIAGNOSIS — Y9301 Activity, walking, marching and hiking: Secondary | ICD-10-CM | POA: Insufficient documentation

## 2014-07-15 DIAGNOSIS — S199XXA Unspecified injury of neck, initial encounter: Secondary | ICD-10-CM | POA: Insufficient documentation

## 2014-07-15 DIAGNOSIS — J449 Chronic obstructive pulmonary disease, unspecified: Secondary | ICD-10-CM | POA: Diagnosis not present

## 2014-07-15 DIAGNOSIS — Z8669 Personal history of other diseases of the nervous system and sense organs: Secondary | ICD-10-CM | POA: Diagnosis not present

## 2014-07-15 DIAGNOSIS — Z8639 Personal history of other endocrine, nutritional and metabolic disease: Secondary | ICD-10-CM | POA: Diagnosis not present

## 2014-07-15 DIAGNOSIS — Z8701 Personal history of pneumonia (recurrent): Secondary | ICD-10-CM | POA: Insufficient documentation

## 2014-07-15 DIAGNOSIS — W182XXA Fall in (into) shower or empty bathtub, initial encounter: Secondary | ICD-10-CM | POA: Insufficient documentation

## 2014-07-15 DIAGNOSIS — Z7982 Long term (current) use of aspirin: Secondary | ICD-10-CM | POA: Diagnosis not present

## 2014-07-15 DIAGNOSIS — Y998 Other external cause status: Secondary | ICD-10-CM | POA: Insufficient documentation

## 2014-07-15 DIAGNOSIS — Z79899 Other long term (current) drug therapy: Secondary | ICD-10-CM | POA: Diagnosis not present

## 2014-07-15 DIAGNOSIS — M1990 Unspecified osteoarthritis, unspecified site: Secondary | ICD-10-CM | POA: Insufficient documentation

## 2014-07-15 DIAGNOSIS — F329 Major depressive disorder, single episode, unspecified: Secondary | ICD-10-CM | POA: Diagnosis not present

## 2014-07-15 DIAGNOSIS — G43909 Migraine, unspecified, not intractable, without status migrainosus: Secondary | ICD-10-CM | POA: Diagnosis not present

## 2014-07-15 DIAGNOSIS — S4991XA Unspecified injury of right shoulder and upper arm, initial encounter: Secondary | ICD-10-CM | POA: Diagnosis not present

## 2014-07-15 DIAGNOSIS — Y92091 Bathroom in other non-institutional residence as the place of occurrence of the external cause: Secondary | ICD-10-CM | POA: Insufficient documentation

## 2014-07-15 DIAGNOSIS — T148 Other injury of unspecified body region: Secondary | ICD-10-CM | POA: Diagnosis not present

## 2014-07-15 DIAGNOSIS — F419 Anxiety disorder, unspecified: Secondary | ICD-10-CM | POA: Diagnosis not present

## 2014-07-15 DIAGNOSIS — F42 Obsessive-compulsive disorder: Secondary | ICD-10-CM | POA: Diagnosis not present

## 2014-07-15 DIAGNOSIS — K219 Gastro-esophageal reflux disease without esophagitis: Secondary | ICD-10-CM | POA: Insufficient documentation

## 2014-07-15 DIAGNOSIS — I509 Heart failure, unspecified: Secondary | ICD-10-CM | POA: Insufficient documentation

## 2014-07-15 DIAGNOSIS — I4891 Unspecified atrial fibrillation: Secondary | ICD-10-CM | POA: Insufficient documentation

## 2014-07-15 DIAGNOSIS — S8991XA Unspecified injury of right lower leg, initial encounter: Secondary | ICD-10-CM | POA: Insufficient documentation

## 2014-07-15 DIAGNOSIS — Z7951 Long term (current) use of inhaled steroids: Secondary | ICD-10-CM | POA: Insufficient documentation

## 2014-07-15 DIAGNOSIS — I10 Essential (primary) hypertension: Secondary | ICD-10-CM | POA: Insufficient documentation

## 2014-07-15 DIAGNOSIS — Z9981 Dependence on supplemental oxygen: Secondary | ICD-10-CM | POA: Diagnosis not present

## 2014-07-15 DIAGNOSIS — Z87891 Personal history of nicotine dependence: Secondary | ICD-10-CM | POA: Diagnosis not present

## 2014-07-15 DIAGNOSIS — W19XXXA Unspecified fall, initial encounter: Secondary | ICD-10-CM

## 2014-07-15 DIAGNOSIS — T07XXXA Unspecified multiple injuries, initial encounter: Secondary | ICD-10-CM

## 2014-07-15 MED ORDER — HYDROCODONE-ACETAMINOPHEN 5-325 MG PO TABS
1.0000 | ORAL_TABLET | Freq: Once | ORAL | Status: AC
Start: 2014-07-15 — End: 2014-07-15
  Administered 2014-07-15: 1 via ORAL
  Filled 2014-07-15: qty 1

## 2014-07-15 NOTE — ED Notes (Addendum)
Pt c/o R shoulder, neck, and knee pain after slipping and falling when getting out of the shower last night. Pt caught herself on her R side. Pt has hx of arthritis in R arm and shoulder and sts "the fall has made it worse." Pt denies head injury or LOC. Pt on 2L of O2 at home for hx of COPD and CHF. Pt has full ROM in R arm and leg. Denies numbness and tingling. Denies increasing SOB than per usual, or chest pain.

## 2014-07-15 NOTE — ED Notes (Signed)
Pt ambulatory.

## 2014-07-15 NOTE — ED Provider Notes (Signed)
CSN: [id number]     Arrival date & time 07/15/14  1206 History   First MD Initiated Contact with Patient 07/15/14 1458     Chief Complaint  Patient presents with  . Fall     (Consider location/radiation/quality/duration/timing/severity/associated sxs/prior Treatment) HPI Comments: Patient complains of pain to her right shoulder, neck and knee after she fell last night. She took a shower and remove walking out of the bathroom when her right knee "gave out". She feels that she exacerbated the arthritis in her right arm, shoulder and knee. Denies hitting her head or losing consciousness. Complains of pain to her right neck, right shoulder and right knee. No chest pain or shortness of breath. No numbness or tingling. No dizziness or lightheadedness. Patient with extensive medical history including COPD, CHF on home oxygen. No breathing worse than baseline. On her baseline 2 L. She does not take anticoagulation.  Patient is a 69 y.o. female presenting with fall. The history is provided by the patient.  Fall Pertinent negatives include no chest pain, no abdominal pain, no headaches and no shortness of breath.    Past Medical History  Diagnosis Date  . Atrial fibrillation   . Hypertension   . Insomnia   . Nonischemic cardiomyopathy   . Hyperlipidemia   . Anxiety   . Cholelithiasis   . Insomnia   . Mitral regurgitation     noted 2010  . H/O epistaxis   . Rhinitis, allergic   . CHF (congestive heart failure)   . Pneumonia     "several times w/exacerbations of the COPD; nothing in the last year" (07/15/2012)  . COPD (chronic obstructive pulmonary disease)     as of 7/13 on 2-3L, pfts 10/2008 with mod obstruction  . Chronic bronchitis with COPD (chronic obstructive pulmonary disease)   . Shortness of breath     "all the time right now" (07/15/2012)  . GERD (gastroesophageal reflux disease)   . KGMWNUUV(253.6)     "weekly" (07/15/2012)  . Migraines     "weekly for awhile; cleared up as I got  older" (07/15/2012)  . DJD (degenerative joint disease)   . Arthritis     "all over" (07/15/2012)  . OCD (obsessive compulsive disorder)   . OCD (obsessive compulsive disorder)   . Depression     h/o SI; "last time I was really serious about it was ~ 1997" (07/15/2012)   Past Surgical History  Procedure Laterality Date  . Tubal ligation  1972  . Cardioversion  2003; 07/2003   Family History  Problem Relation Age of Onset  . Asthma    . Emphysema    . Allergies    . Cancer      aunt had several types of cancer  . COPD Mother   . Emphysema Mother   . Cirrhosis Father    History  Substance Use Topics  . Smoking status: Former Smoker -- 1.00 packs/day for 35 years    Types: Cigarettes    Quit date: 06/18/2014  . Smokeless tobacco: Never Used     Comment: No desire to quit at this time./ 1PPD  . Alcohol Use: No   OB History    No data available     Review of Systems  Constitutional: Negative for fever and activity change.  HENT: Negative for congestion and rhinorrhea.   Respiratory: Negative for cough, chest tightness and shortness of breath.   Cardiovascular: Negative for chest pain.  Gastrointestinal: Negative for nausea, vomiting, abdominal pain and  abdominal distention.  Genitourinary: Negative for dysuria and hematuria.  Musculoskeletal: Positive for myalgias, arthralgias and neck pain. Negative for back pain.  Skin: Negative for rash.  Neurological: Positive for weakness. Negative for dizziness and headaches.  A complete 10 system review of systems was obtained and all systems are negative except as noted in the HPI and PMH.      Allergies  Ace inhibitors; Diphenhydramine hcl; Erythromycin; Nsaids; Nyquil; Tramadol hcl; Codeine; Tomato; Flexeril; Orange fruit; and Pseudoephedrine  Home Medications   Prior to Admission medications   Medication Sig Start Date End Date Taking? Authorizing Provider  albuterol (PROVENTIL HFA;VENTOLIN HFA) 108 (90 BASE) MCG/ACT inhaler  Inhale 1-2 puffs into the lungs every 6 (six) hours as needed for wheezing. 04/28/14  Yes Tanna Furry, MD  albuterol (PROVENTIL) (2.5 MG/3ML) 0.083% nebulizer solution Take 3 mLs (2.5 mg total) by nebulization every 6 (six) hours as needed for wheezing. 07/08/14  Yes Theodis Blaze, MD  aspirin 325 MG tablet Take 650 mg by mouth daily.    Yes Historical Provider, MD  budesonide-formoterol (SYMBICORT) 160-4.5 MCG/ACT inhaler Inhale 2 puffs into the lungs 2 (two) times daily. 06/05/13  Yes Annia Belt, MD  busPIRone (BUSPAR) 10 MG tablet Take 10 mg by mouth 3 (three) times daily. 04/22/14  Yes Historical Provider, MD  chlorpheniramine (CHLOR-TRIMETON) 4 MG tablet Take 1 tablet (4 mg total) by mouth every 8 (eight) hours as needed for allergies. 11/13/13  Yes Tasrif Ahmed, MD  diazepam (VALIUM) 2 MG tablet Take 1 tablet (2 mg total) by mouth every 12 (twelve) hours as needed for anxiety. 12/06/12  Yes Wilber Oliphant, MD  diltiazem (TIAZAC) 420 MG 24 hr capsule Take 1 capsule (420 mg total) by mouth daily. 05/09/14 08/08/15 Yes Bartholomew Crews, MD  FLUoxetine (PROZAC) 20 MG capsule TAKE THREE CAPSULES BY MOUTH ONCE DAILY Patient taking differently: Take 60 mg by mouth. TAKE THREE CAPSULES BY MOUTH ONCE DAILY 01/03/14  Yes Tasrif Ahmed, MD  furosemide (LASIX) 20 MG tablet Take 20-40 mg by mouth 3 (three) times daily as needed for fluid (fluid). Take 2 tablets (40 mg) in the am and Take 1 tablet (20 mg) in the afternoon.   Yes Historical Provider, MD  HYDROcodone-acetaminophen (NORCO) 7.5-325 MG per tablet Take 1-2 tablets by mouth every 6 (six) hours as needed for moderate pain. 07/08/14 08/02/15 Yes Iskra Barbera Setters, MD  HYDROcodone-homatropine [facility]) 5-1.5 MG/5ML syrup Take 5 mLs by mouth every 6 (six) hours as needed for cough. 04/30/14  Yes Bartholomew Crews, MD  metoprolol succinate (TOPROL-XL) 25 MG 24 hr tablet Take 0.5 tablets (12.5 mg total) by mouth daily after lunch. 07/02/14  Yes Arnoldo Morale,  MD  pantoprazole (PROTONIX) 40 MG tablet TAKE ONE TABLET BY MOUTH TWICE DAILY 01/03/14  Yes Tasrif Ahmed, MD  tiotropium (SPIRIVA) 18 MCG inhalation capsule Place 1 capsule (18 mcg total) into inhaler and inhale daily. 07/05/13  Yes Pollie Friar, MD  acetaminophen-codeine (TYLENOL #3) 300-30 MG per tablet Take 1 tablet by mouth every 6 (six) hours as needed for moderate pain. Patient not taking: Reported on 07/12/2014 07/02/14   Arnoldo Morale, MD  guaiFENesin (MUCINEX) 600 MG 12 hr tablet Take 1 tablet (600 mg total) by mouth 2 (two) times daily. Patient taking differently: Take 600 mg by mouth 2 (two) times daily as needed for cough.  04/28/14   Tanna Furry, MD  hydroxypropyl methylcellulose / hypromellose (ISOPTO TEARS / GONIOVISC) 2.5 % ophthalmic solution  Place 1 drop into both eyes daily as needed for dry eyes (dry eyes).     Historical Provider, MD  nystatin (MYCOSTATIN) 100000 UNIT/ML suspension Take 5 mLs (500,000 Units total) by mouth as needed. 04/16/14   Dellia Nims, MD  predniSONE (DELTASONE) 10 MG tablet Take 50 mg tablet today and taper down by 10 mg daily until completed Patient not taking: Reported on 07/15/2014 07/08/14   Theodis Blaze, MD   BP 106/67 mmHg  Pulse 79  Temp(Src) 98.2 F (36.8 C) (Oral)  Resp 16  SpO2 93% Physical Exam  Constitutional: She is oriented to person, place, and time. She appears well-developed and well-nourished. No distress.  HENT:  Head: Normocephalic and atraumatic.  Mouth/Throat: Oropharynx is clear and moist. No oropharyngeal exudate.  Eyes: Conjunctivae and EOM are normal. Pupils are equal, round, and reactive to light.  Neck: Normal range of motion. Neck supple.  Paraspinal cervical tenderness on the right, no midline tenderness, questionable swelling of the right supraclavicular area.  Cardiovascular: Normal rate, regular rhythm, normal heart sounds and intact distal pulses.   No murmur heard. Pulmonary/Chest: Effort normal and breath sounds  normal. No respiratory distress.  Abdominal: Soft. There is no tenderness. There is no rebound and no guarding.  Musculoskeletal: Normal range of motion. She exhibits tenderness. She exhibits no edema.  Tenderness to lateral and posterior right shoulder, full range of motion. No deformity. Intact radial pulse, intact mental hand movements. Full range of motion of right knee. Intact distal pulses. No T or L-spine tenderness  Neurological: She is alert and oriented to person, place, and time. No cranial nerve deficit. She exhibits normal muscle tone. Coordination normal.  No ataxia on finger to nose bilaterally. No pronator drift. 5/5 strength throughout. CN 2-12 intact. Negative Romberg. Equal grip strength. Sensation intact. Gait is normal.   Skin: Skin is warm.  Psychiatric: She has a normal mood and affect. Her behavior is normal.  Nursing note and vitals reviewed.   ED Course  Procedures (including critical care time) Labs Review Labs Reviewed - No data to display  Imaging Review Dg Chest 2 View  07/15/2014   CLINICAL DATA:  The patient fell today.  Recent pneumonia.  EXAM: CHEST  2 VIEW  COMPARISON:  Chest x-ray dated 07/30/2014 and chest CT dated 09/29/2014  FINDINGS: The heart size and pulmonary vascularity are normal. Tortuosity and calcification of the thoracic aorta. Small area of scarring in the lingula, stable. No infiltrates or effusions. No acute osseous abnormality.  IMPRESSION: No acute abnormalities.   Electronically Signed   By: Lorriane Shire M.D.   On: 07/15/2014 15:33   Dg Shoulder Right  07/15/2014   CLINICAL DATA:  Right knee gave out and patient fell and injured right shoulder today.  EXAM: RIGHT SHOULDER - 2+ VIEW; RIGHT KNEE - COMPLETE 4+ VIEW  COMPARISON:  None.  FINDINGS: Right shoulder:  The joint spaces are maintained. No acute fractures identified. The visualized right lung is clear.  Right knee:  Mild degenerative changes but no acute fracture or osteochondral  abnormality. No definite joint effusion.  IMPRESSION: No acute bony findings.   Electronically Signed   By: Marijo Sanes M.D.   On: 07/15/2014 14:03   Ct Head Wo Contrast  07/15/2014   CLINICAL DATA:  Right-sided neck pain secondary to a fall out of the shower last night.  EXAM: CT HEAD WITHOUT CONTRAST  CT CERVICAL SPINE WITHOUT CONTRAST  TECHNIQUE: Multidetector CT imaging of the head  and cervical spine was performed following the standard protocol without intravenous contrast. Multiplanar CT image reconstructions of the cervical spine were also generated.  COMPARISON:  Brain MRI dated 06/22/2011  FINDINGS: CT HEAD FINDINGS  No mass lesion. No midline shift. No acute hemorrhage or hematoma. No extra-axial fluid collections. No evidence of acute infarction. Brain parenchyma is normal. Osseous structures are normal.  CT CERVICAL SPINE FINDINGS  There is no fracture or prevertebral soft tissue swelling.  3.4 mm spondylolisthesis of C3 on C4 due to severe right facet arthritis. Slight bulging of the uncovered disc.  Auto fusion of the right facet joint at C2-3. Severe degenerative disc disease at C4-5 and C5-6 and to a lesser degree at C6-7 and C7-T1. Minimal anterolisthesis of C7 on T1. Moderate left facet arthritis at C7-T1. There is also moderate left facet arthritis at C4-5.  IMPRESSION: 1. Normal CT scan of the head. 2. No acute abnormality of the cervical spine.   Electronically Signed   By: Lorriane Shire M.D.   On: 07/15/2014 16:10   Ct Cervical Spine Wo Contrast  07/15/2014   CLINICAL DATA:  Right-sided neck pain secondary to a fall out of the shower last night.  EXAM: CT HEAD WITHOUT CONTRAST  CT CERVICAL SPINE WITHOUT CONTRAST  TECHNIQUE: Multidetector CT imaging of the head and cervical spine was performed following the standard protocol without intravenous contrast. Multiplanar CT image reconstructions of the cervical spine were also generated.  COMPARISON:  Brain MRI dated 06/22/2011  FINDINGS: CT  HEAD FINDINGS  No mass lesion. No midline shift. No acute hemorrhage or hematoma. No extra-axial fluid collections. No evidence of acute infarction. Brain parenchyma is normal. Osseous structures are normal.  CT CERVICAL SPINE FINDINGS  There is no fracture or prevertebral soft tissue swelling.  3.4 mm spondylolisthesis of C3 on C4 due to severe right facet arthritis. Slight bulging of the uncovered disc.  Auto fusion of the right facet joint at C2-3. Severe degenerative disc disease at C4-5 and C5-6 and to a lesser degree at C6-7 and C7-T1. Minimal anterolisthesis of C7 on T1. Moderate left facet arthritis at C7-T1. There is also moderate left facet arthritis at C4-5.  IMPRESSION: 1. Normal CT scan of the head. 2. No acute abnormality of the cervical spine.   Electronically Signed   By: Lorriane Shire M.D.   On: 07/15/2014 16:10   Dg Knee Complete 4 Views Right  07/15/2014   CLINICAL DATA:  Right knee gave out and patient fell and injured right shoulder today.  EXAM: RIGHT SHOULDER - 2+ VIEW; RIGHT KNEE - COMPLETE 4+ VIEW  COMPARISON:  None.  FINDINGS: Right shoulder:  The joint spaces are maintained. No acute fractures identified. The visualized right lung is clear.  Right knee:  Mild degenerative changes but no acute fracture or osteochondral abnormality. No definite joint effusion.  IMPRESSION: No acute bony findings.   Electronically Signed   By: Marijo Sanes M.D.   On: 07/15/2014 14:03     EKG Interpretation   Date/Time:  Sunday July 15 2014 15:55:41 EDT Ventricular Rate:  80 PR Interval:    QRS Duration: 84 QT Interval:  381 QTC Calculation: 439 R Axis:   67 Text Interpretation:  Atrial fibrillation Low voltage, precordial leads  Anteroseptal infarct, old No significant change was found Confirmed by  Wyvonnia Dusky  MD, Claramae Rigdon [phone]) on 07/15/2014 4:25:55 PM      MDM   Final diagnoses:  Fall, initial encounter  Multiple contusions  Mechanical fall with right shoulder, knee and neck pain.   Denies LOC.   Triage x-rays of knee and shoulder are negative. Patient has intact distal pulses.  She has atrial fibrillation as previously.  She is not anticoagulated due to noncompliance in the past.  CT head and C spine negative.  Patient is tolerating by mouth and ambulatory. She is requesting refills of her narcotic pain medications. She is informed of the ED cannot refill chronic pain medications. She has a doctor's appointment in 2 days. She declines NSAIDs or tramadol. Return precautions discussed.  Ezequiel Essex, MD 07/16/14 [phone]

## 2014-07-15 NOTE — ED Notes (Signed)
Patient transported to X-ray 

## 2014-07-15 NOTE — Discharge Instructions (Signed)
Contusion Your xrays are negative for broken bones. Follow up with your doctor as scheduled. Return to the ED if you develop new or worsening symptoms. A contusion is a deep bruise. Contusions are the result of an injury that caused bleeding under the skin. The contusion may turn blue, purple, or yellow. Minor injuries will give you a painless contusion, but more severe contusions may stay painful and swollen for a few weeks.  CAUSES  A contusion is usually caused by a blow, trauma, or direct force to an area of the body. SYMPTOMS   Swelling and redness of the injured area.  Bruising of the injured area.  Tenderness and soreness of the injured area.  Pain. DIAGNOSIS  The diagnosis can be made by taking a history and physical exam. An X-ray, CT scan, or MRI may be needed to determine if there were any associated injuries, such as fractures. TREATMENT  Specific treatment will depend on what area of the body was injured. In general, the best treatment for a contusion is resting, icing, elevating, and applying cold compresses to the injured area. Over-the-counter medicines may also be recommended for pain control. Ask your caregiver what the best treatment is for your contusion. HOME CARE INSTRUCTIONS   Put ice on the injured area.  Put ice in a plastic bag.  Place a towel between your skin and the bag.  Leave the ice on for 15-20 minutes, 3-4 times a day, or as directed by your health care provider.  Only take over-the-counter or prescription medicines for pain, discomfort, or fever as directed by your caregiver. Your caregiver may recommend avoiding anti-inflammatory medicines (aspirin, ibuprofen, and naproxen) for 48 hours because these medicines may increase bruising.  Rest the injured area.  If possible, elevate the injured area to reduce swelling. SEEK IMMEDIATE MEDICAL CARE IF:   You have increased bruising or swelling.  You have pain that is getting worse.  Your swelling or  pain is not relieved with medicines. MAKE SURE YOU:   Understand these instructions.  Will watch your condition.  Will get help right away if you are not doing well or get worse. Document Released: 11/05/2004 Document Revised: 01/31/2013 Document Reviewed: 12/01/2010 [facility] Patient Information 2015 [place], [place]. This information is not intended to replace advice given to you by your health care provider. Make sure you discuss any questions you have with your health care provider.

## 2014-07-16 ENCOUNTER — Encounter: Payer: Medicare Other | Admitting: Internal Medicine

## 2014-07-19 ENCOUNTER — Observation Stay (HOSPITAL_COMMUNITY)
Admission: EM | Admit: 2014-07-19 | Discharge: 2014-07-22 | Disposition: A | Discharge status: Home / Self Care | Payer: Medicare Other | Attending: Internal Medicine | Admitting: Internal Medicine

## 2014-07-19 ENCOUNTER — Other Ambulatory Visit (HOSPITAL_COMMUNITY): Payer: Self-pay

## 2014-07-19 ENCOUNTER — Emergency Department (HOSPITAL_COMMUNITY): Payer: Medicare Other

## 2014-07-19 ENCOUNTER — Encounter (HOSPITAL_COMMUNITY): Payer: Self-pay | Admitting: *Deleted

## 2014-07-19 DIAGNOSIS — G4700 Insomnia, unspecified: Secondary | ICD-10-CM | POA: Diagnosis not present

## 2014-07-19 DIAGNOSIS — I10 Essential (primary) hypertension: Secondary | ICD-10-CM | POA: Diagnosis present

## 2014-07-19 DIAGNOSIS — G894 Chronic pain syndrome: Secondary | ICD-10-CM | POA: Diagnosis not present

## 2014-07-19 DIAGNOSIS — Z9981 Dependence on supplemental oxygen: Secondary | ICD-10-CM | POA: Insufficient documentation

## 2014-07-19 DIAGNOSIS — F419 Anxiety disorder, unspecified: Secondary | ICD-10-CM | POA: Diagnosis not present

## 2014-07-19 DIAGNOSIS — Z881 Allergy status to other antibiotic agents status: Secondary | ICD-10-CM | POA: Insufficient documentation

## 2014-07-19 DIAGNOSIS — K219 Gastro-esophageal reflux disease without esophagitis: Secondary | ICD-10-CM | POA: Diagnosis not present

## 2014-07-19 DIAGNOSIS — E785 Hyperlipidemia, unspecified: Secondary | ICD-10-CM | POA: Insufficient documentation

## 2014-07-19 DIAGNOSIS — J9611 Chronic respiratory failure with hypoxia: Secondary | ICD-10-CM | POA: Diagnosis not present

## 2014-07-19 DIAGNOSIS — I429 Cardiomyopathy, unspecified: Secondary | ICD-10-CM | POA: Insufficient documentation

## 2014-07-19 DIAGNOSIS — Z888 Allergy status to other drugs, medicaments and biological substances status: Secondary | ICD-10-CM | POA: Insufficient documentation

## 2014-07-19 DIAGNOSIS — Z91018 Allergy to other foods: Secondary | ICD-10-CM | POA: Diagnosis not present

## 2014-07-19 DIAGNOSIS — J9621 Acute and chronic respiratory failure with hypoxia: Secondary | ICD-10-CM | POA: Diagnosis not present

## 2014-07-19 DIAGNOSIS — Z7982 Long term (current) use of aspirin: Secondary | ICD-10-CM | POA: Diagnosis not present

## 2014-07-19 DIAGNOSIS — M1990 Unspecified osteoarthritis, unspecified site: Secondary | ICD-10-CM | POA: Diagnosis not present

## 2014-07-19 DIAGNOSIS — F329 Major depressive disorder, single episode, unspecified: Secondary | ICD-10-CM | POA: Diagnosis not present

## 2014-07-19 DIAGNOSIS — F42 Obsessive-compulsive disorder: Secondary | ICD-10-CM | POA: Insufficient documentation

## 2014-07-19 DIAGNOSIS — J441 Chronic obstructive pulmonary disease with (acute) exacerbation: Secondary | ICD-10-CM | POA: Diagnosis present

## 2014-07-19 DIAGNOSIS — D72829 Elevated white blood cell count, unspecified: Secondary | ICD-10-CM

## 2014-07-19 DIAGNOSIS — Z79899 Other long term (current) drug therapy: Secondary | ICD-10-CM | POA: Diagnosis not present

## 2014-07-19 DIAGNOSIS — I4891 Unspecified atrial fibrillation: Secondary | ICD-10-CM | POA: Diagnosis present

## 2014-07-19 DIAGNOSIS — Z87891 Personal history of nicotine dependence: Secondary | ICD-10-CM | POA: Insufficient documentation

## 2014-07-19 DIAGNOSIS — Z886 Allergy status to analgesic agent status: Secondary | ICD-10-CM | POA: Insufficient documentation

## 2014-07-19 DIAGNOSIS — R093 Abnormal sputum: Secondary | ICD-10-CM

## 2014-07-19 DIAGNOSIS — I340 Nonrheumatic mitral (valve) insufficiency: Secondary | ICD-10-CM | POA: Diagnosis not present

## 2014-07-19 DIAGNOSIS — I5032 Chronic diastolic (congestive) heart failure: Secondary | ICD-10-CM | POA: Diagnosis not present

## 2014-07-19 DIAGNOSIS — I482 Chronic atrial fibrillation: Secondary | ICD-10-CM | POA: Diagnosis not present

## 2014-07-19 LAB — COMPREHENSIVE METABOLIC PANEL
ALK PHOS: 87 U/L (ref 38–126)
ALT: 12 U/L — ABNORMAL LOW (ref 14–54)
AST: 13 U/L — ABNORMAL LOW (ref 15–41)
Albumin: 2.8 g/dL — ABNORMAL LOW (ref 3.5–5.0)
Anion gap: 10 (ref 5–15)
BUN: 7 mg/dL (ref 6–20)
CALCIUM: 8.7 mg/dL — AB (ref 8.9–10.3)
CHLORIDE: 101 mmol/L (ref 101–111)
CO2: 26 mmol/L (ref 22–32)
Creatinine, Ser: 0.75 mg/dL (ref 0.44–1.00)
GFR calc Af Amer: >60 mL/min (ref >60)
GLUCOSE: 134 mg/dL — AB (ref 65–99)
Potassium: 3.9 mmol/L (ref 3.5–5.1)
Sodium: 137 mmol/L (ref 135–145)
TOTAL PROTEIN: 5.9 g/dL — AB (ref 6.5–8.1)
Total Bilirubin: 0.9 mg/dL (ref 0.3–1.2)

## 2014-07-19 LAB — CBC WITH DIFFERENTIAL/PLATELET
Basophils Absolute: 0 10*3/uL (ref 0.0–0.1)
Basophils Relative: 0 % (ref 0–1)
Eosinophils Absolute: 0 10*3/uL (ref 0.0–0.7)
Eosinophils Relative: 0 % (ref 0–5)
HCT: 35 % — ABNORMAL LOW (ref 36.0–46.0)
Hemoglobin: 11.1 g/dL — ABNORMAL LOW (ref 12.0–15.0)
Lymphocytes Relative: 6 % — ABNORMAL LOW (ref 12–46)
Lymphs Abs: 1.2 10*3/uL (ref 0.7–4.0)
MCH: 31.1 pg (ref 26.0–34.0)
MCHC: 31.7 g/dL (ref 30.0–36.0)
MCV: 98 fL (ref 78.0–100.0)
MONOS PCT: 8 % (ref 3–12)
Monocytes Absolute: 1.7 10*3/uL — ABNORMAL HIGH (ref 0.1–1.0)
Neutro Abs: 16.9 10*3/uL — ABNORMAL HIGH (ref 1.7–7.7)
Neutrophils Relative %: 86 % — ABNORMAL HIGH (ref 43–77)
Platelets: 233 10*3/uL (ref 150–400)
RBC: 3.57 MIL/uL — AB (ref 3.87–5.11)
RDW: 14.9 % (ref 11.5–15.5)
WBC: 19.8 10*3/uL — ABNORMAL HIGH (ref 4.0–10.5)

## 2014-07-19 LAB — I-STAT TROPONIN, ED: TROPONIN I, POC: 0 ng/mL (ref 0.00–0.08)

## 2014-07-19 LAB — BRAIN NATRIURETIC PEPTIDE: B Natriuretic Peptide: 287.6 pg/mL — ABNORMAL HIGH (ref 0.0–100.0)

## 2014-07-19 MED ORDER — PREDNISONE 20 MG PO TABS
60.0000 mg | ORAL_TABLET | Freq: Once | ORAL | Status: AC
  Administered 2014-07-19: 60 mg via ORAL
  Filled 2014-07-19: qty 3

## 2014-07-19 MED ORDER — IPRATROPIUM BROMIDE 0.02 % IN SOLN
0.5000 mg | Freq: Once | RESPIRATORY_TRACT | Status: AC
  Administered 2014-07-19: 0.5 mg via RESPIRATORY_TRACT
  Filled 2014-07-19: qty 2.5

## 2014-07-19 MED ORDER — VANCOMYCIN HCL IN DEXTROSE 1-5 GM/200ML-% IV SOLN
1000.0000 mg | Freq: Once | INTRAVENOUS | Status: AC
  Administered 2014-07-19: 1000 mg via INTRAVENOUS
  Filled 2014-07-19: qty 200

## 2014-07-19 MED ORDER — PIPERACILLIN-TAZOBACTAM 3.375 G IVPB 30 MIN
3.3750 g | Freq: Three times a day (TID) | INTRAVENOUS | Status: DC
  Administered 2014-07-19: 3.375 g via INTRAVENOUS
  Filled 2014-07-19 (×2): qty 50

## 2014-07-19 MED ORDER — ALBUTEROL SULFATE (2.5 MG/3ML) 0.083% IN NEBU
5.0000 mg | INHALATION_SOLUTION | Freq: Once | RESPIRATORY_TRACT | Status: AC
  Administered 2014-07-19: 5 mg via RESPIRATORY_TRACT
  Filled 2014-07-19: qty 6

## 2014-07-19 MED ORDER — HYDROCODONE-ACETAMINOPHEN 5-325 MG PO TABS
2.0000 | ORAL_TABLET | Freq: Once | ORAL | Status: AC
  Administered 2014-07-19: 2 via ORAL
  Filled 2014-07-19: qty 2

## 2014-07-19 NOTE — H&P (Signed)
[facility] History and Physical  Patient: Kelsey Garcia  MRN: [id number]  DOB: November 21, 1945  DOS: the patient was seen and examined on 07/19/2014 PCP: No PCP Per Patient  Chief Complaint: Shortness of breath  HPI: Kelsey Garcia is a 69 y.o. female with Past medical history of atrial fibrillation, hypertension, chronic diastolic dysfunction, COPD, chronic respiratory failure on 2 L of oxygen, mitral regurgitation, mood disorder. The patient presents with complaints of sudden onset of shortness of breath starting on 07/18/2014 night. She mentions that she was at her baseline has some chronic shortness of breath at her baseline. She uses 2 L of oxygen regularly 24 7. On the night of 07/18/2014 she suddenly woke up from her sleep with the complaint of sudden onset of shortness of breath with cough with brown green expectoration. She had mild fever without any chills. She mentions she is chest pain-free at present but did have some chest pressure during the day. She denies any dizziness or lightheadedness. She denies any nausea or vomiting. She does not have any abdominal tenderness nor diarrhea no burning urination. No leg swelling. She mentions she is compliant with all her medication. She recently finished a prednisone course 3 days ago.  The patient is coming from home. And at her baseline independent for most of her ADL.  Review of Systems: as mentioned in the history of present illness.  A comprehensive review of the other systems is negative.  Past Medical History  Diagnosis Date  . Atrial fibrillation   . Hypertension   . Insomnia   . Nonischemic cardiomyopathy   . Hyperlipidemia   . Anxiety   . Cholelithiasis   . Insomnia   . Mitral regurgitation     noted 2010  . H/O epistaxis   . Rhinitis, allergic   . CHF (congestive heart failure)   . Pneumonia     "several times w/exacerbations of the COPD; nothing in the last year" (07/15/2012)  . COPD (chronic obstructive  pulmonary disease)     as of 7/13 on 2-3L, pfts 10/2008 with mod obstruction  . Chronic bronchitis with COPD (chronic obstructive pulmonary disease)   . Shortness of breath     "all the time right now" (07/15/2012)  . GERD (gastroesophageal reflux disease)   . SEGBTDVV(616.0)     "weekly" (07/15/2012)  . Migraines     "weekly for awhile; cleared up as I got older" (07/15/2012)  . DJD (degenerative joint disease)   . Arthritis     "all over" (07/15/2012)  . OCD (obsessive compulsive disorder)   . OCD (obsessive compulsive disorder)   . Depression     h/o SI; "last time I was really serious about it was ~ 1997" (07/15/2012)   Past Surgical History  Procedure Laterality Date  . Tubal ligation  1972  . Cardioversion  2003; 07/2003   Social History:  reports that she quit smoking about 4 weeks ago. Her smoking use included Cigarettes. She has a 35 pack-year smoking history. She has never used smokeless tobacco. She reports that she does not drink alcohol or use illicit drugs.  Allergies  Allergen Reactions  . Ace Inhibitors Cough  . Diphenhydramine Hcl Other (See Comments)    "feels like my skin is crawling, and legs twitches"  . Erythromycin Diarrhea  . Nsaids Nausea And Vomiting  . Nyquil [Pseudoeph-Doxylamine-Dm-Apap] Itching  . Tramadol Hcl Other (See Comments)    stomach pain, hallucinations  . Codeine Swelling    Patient report  .  Tomato Other (See Comments)    Stomach swells  . Flexeril [Cyclobenzaprine] Anxiety  . Orange Fruit [Citrus] Rash  . Pseudoephedrine Palpitations    Family History  Problem Relation Age of Onset  . Asthma    . Emphysema    . Allergies    . Cancer      aunt had several types of cancer  . COPD Mother   . Emphysema Mother   . Cirrhosis Father     Prior to Admission medications   Medication Sig Start Date End Date Taking? Authorizing Provider  albuterol (PROVENTIL HFA;VENTOLIN HFA) 108 (90 BASE) MCG/ACT inhaler Inhale 1-2 puffs into the lungs  every 6 (six) hours as needed for wheezing. 04/28/14  Yes Tanna Furry, MD  albuterol (PROVENTIL) (2.5 MG/3ML) 0.083% nebulizer solution Take 3 mLs (2.5 mg total) by nebulization every 6 (six) hours as needed for wheezing. 07/08/14  Yes Theodis Blaze, MD  aspirin 325 MG tablet Take 650 mg by mouth daily. Take every day per patient   Yes Historical Provider, MD  budesonide-formoterol (SYMBICORT) 160-4.5 MCG/ACT inhaler Inhale 2 puffs into the lungs 2 (two) times daily. 06/05/13  Yes Annia Belt, MD  busPIRone (BUSPAR) 10 MG tablet Take 10 mg by mouth 3 (three) times daily. 04/22/14  Yes Historical Provider, MD  chlorpheniramine (CHLOR-TRIMETON) 4 MG tablet Take 1 tablet (4 mg total) by mouth every 8 (eight) hours as needed for allergies. 11/13/13  Yes Tasrif Ahmed, MD  diazepam (VALIUM) 2 MG tablet Take 1 tablet (2 mg total) by mouth every 12 (twelve) hours as needed for anxiety. 12/06/12  Yes Wilber Oliphant, MD  diltiazem (TIAZAC) 420 MG 24 hr capsule Take 1 capsule (420 mg total) by mouth daily. 05/09/14 08/08/15 Yes Bartholomew Crews, MD  FLUoxetine (PROZAC) 20 MG capsule TAKE THREE CAPSULES BY MOUTH ONCE DAILY Patient taking differently: Take 60 mg by mouth. TAKE THREE CAPSULES BY MOUTH ONCE DAILY 01/03/14  Yes Tasrif Ahmed, MD  furosemide (LASIX) 20 MG tablet Take 20-40 mg by mouth 3 (three) times daily as needed for fluid (fluid). Take 2 tablets (40 mg) in the am and Take 1 tablet (20 mg) in the afternoon.   Yes Historical Provider, MD  guaiFENesin (MUCINEX) 600 MG 12 hr tablet Take 1 tablet (600 mg total) by mouth 2 (two) times daily. Patient taking differently: Take 600 mg by mouth 2 (two) times daily as needed for cough.  04/28/14  Yes Tanna Furry, MD  HYDROcodone-acetaminophen (NORCO) 7.5-325 MG per tablet Take 1-2 tablets by mouth every 6 (six) hours as needed for moderate pain. 07/08/14 08/02/15 Yes Iskra Barbera Setters, MD  HYDROcodone-homatropine [facility]) 5-1.5 MG/5ML syrup Take 5 mLs by mouth  every 6 (six) hours as needed for cough. 04/30/14  Yes Bartholomew Crews, MD  hydroxypropyl methylcellulose / hypromellose (ISOPTO TEARS / GONIOVISC) 2.5 % ophthalmic solution Place 1 drop into both eyes daily as needed for dry eyes (dry eyes).    Yes Historical Provider, MD  metoprolol succinate (TOPROL-XL) 25 MG 24 hr tablet Take 0.5 tablets (12.5 mg total) by mouth daily after lunch. 07/02/14  Yes Arnoldo Morale, MD  nystatin (MYCOSTATIN) 100000 UNIT/ML suspension Take 5 mLs (500,000 Units total) by mouth as needed. Patient taking differently: Take 500,000 Units by mouth daily as needed.  04/16/14  Yes Tasrif Ahmed, MD  pantoprazole (PROTONIX) 40 MG tablet TAKE ONE TABLET BY MOUTH TWICE DAILY 01/03/14  Yes Tasrif Ahmed, MD  tiotropium (SPIRIVA) 18 MCG inhalation  capsule Place 1 capsule (18 mcg total) into inhaler and inhale daily. 07/05/13  Yes Pollie Friar, MD  acetaminophen-codeine (TYLENOL #3) 300-30 MG per tablet Take 1 tablet by mouth every 6 (six) hours as needed for moderate pain. Patient not taking: Reported on 07/12/2014 07/02/14   Arnoldo Morale, MD  predniSONE (DELTASONE) 10 MG tablet Take 50 mg tablet today and taper down by 10 mg daily until completed Patient not taking: Reported on 07/15/2014 07/08/14   Theodis Blaze, MD    Physical Exam: Filed Vitals:   07/19/14 2215 07/19/14 2300 07/19/14 2306 07/19/14 2315  BP: 120/69 113/65 113/65 112/60  Pulse: 95 97 89 95  Temp:      TempSrc:      Resp: '19 20 18 17  '$ Weight:      SpO2: 100% 100% 100% 99%    General: Alert, Awake and Oriented to Time, Place and Person. Appear in moderate distress Eyes: PERRL ENT: Oral Mucosa clear moist. Neck: no JVD Cardiovascular: S1 and S2 Present, no Murmur, Peripheral Pulses Present Respiratory: Bilateral Air entry equal and Decreased,  Faint basal Crackles, occasional wheezes Abdomen: Bowel Sound present, Soft and non tender Skin: no Rash Extremities: no Pedal edema, no calf tenderness Neurologic:  Grossly no focal neuro deficit.  Labs on Admission:  CBC:  Recent Labs Lab 07/19/14 2056  WBC 19.8*  NEUTROABS 16.9*  HGB 11.1*  HCT 35.0*  MCV 98.0  PLT 233    CMP     Component Value Date/Time   NA 137 07/19/2014 2056   K 3.9 07/19/2014 2056   CL 101 07/19/2014 2056   CO2 26 07/19/2014 2056   GLUCOSE 134* 07/19/2014 2056   BUN 7 07/19/2014 2056   CREATININE 0.75 07/19/2014 2056   CREATININE 0.71 06/15/2014 1008   CALCIUM 8.7* 07/19/2014 2056   PROT 5.9* 07/19/2014 2056   ALBUMIN 2.8* 07/19/2014 2056   AST 13* 07/19/2014 2056   ALT 12* 07/19/2014 2056   ALKPHOS 87 07/19/2014 2056   BILITOT 0.9 07/19/2014 2056   GFRNONAA >60 07/19/2014 2056   GFRNONAA 66 10/12/2013 1516   GFRAA >60 07/19/2014 2056   GFRAA 76 10/12/2013 1516    No results for input(s): LIPASE, AMYLASE in the last 168 hours.  No results for input(s): CKTOTAL, CKMB, CKMBINDEX, TROPONINI in the last 168 hours. BNP (last 3 results)  Recent Labs  06/19/14 1032 06/19/14 1745 07/19/14 2056  BNP 418.5* 441.4* 287.6*    ProBNP (last 3 results) No results for input(s): PROBNP in the last 8760 hours.   Radiological Exams on Admission: Dg Chest 2 View  07/19/2014   CLINICAL DATA:  Shortness of breath.  Chest congestion.  COPD.  EXAM: CHEST  2 VIEW  COMPARISON:  07/15/2014  FINDINGS: Heart size remains within normal limits. Diffuse chronic pulmonary interstitial prominence remains stable. No evidence of acute pulmonary infiltrate or pleural effusion. No definite mass or lymphadenopathy identified.  IMPRESSION: Stable chronic pulmonary interstitial prominence. No active lung disease.   Electronically Signed   By: Earle Gell M.D.   On: 07/19/2014 22:02   EKG: Independently reviewed. atrial fibrillation, rate controlled.  Assessment/Plan Principal Problem:   COPD exacerbation Active Problems:   Essential hypertension   GERD (gastroesophageal reflux disease)   Chronic diastolic CHF (congestive heart  failure)   Atrial fibrillation   Chronic respiratory failure with hypoxia  1. COPD exacerbation Patient presents with complaints of sudden onset of shortness of breath as well as cough. On  her presentation she was on 5 L of nasal cannula requiring recommended adequate saturation. After her breathing treatment she is at her baseline 2 L of nasal cannula at present but appears in respiratory distress. Her chest x-ray does not show any evidence of acute abnormality. She has leukocytosis which could also be secondary to steroid-induced. Although with her current presentation, significant leukocytosis, change in the sputum color as well as poor oral intake she will be empirically treated for possible COPD exacerbation secondary to possible bronchitis. We get further workup. She'll be treated with doxycycline. Short course prednisone will be provided. Duo nebs. Flutter device.  2. Essential hypertension. Chronic CHF. Continue home medication.  3. Atrial fibrillation. Rate controlled. Continue Cardizem. Patient does not want to take anticoagulation. Continue aspirin as per home dose.  4. Chronic pain syndrome. Patient is a Norco. Currently referred to pain management.  5. Mood disorder. Next and continue home medications.  Advance goals of care discussion: Full code   DVT Prophylaxis: subcutaneous Heparin Nutrition: Regular diet  Disposition: Admitted as inpatient, telemetry unit.  Author: Berle Mull, MD [facility] Pager: [phone] 07/19/2014  If 7PM-7AM, please contact night-coverage [email or web address] Password [email or web address]

## 2014-07-19 NOTE — ED Notes (Signed)
MD at bedside. 

## 2014-07-19 NOTE — ED Notes (Signed)
Per EMS: pt coming from home with a sudden onset of respiratory distress that woke her from sleep. Pt also c/o non radiating chest pressure. Upon EMS arrival pt was 90%, pt placed on CPAP, pt states feeling better with CPAP. Pt was given 125 solumedrol, 5 mg albuterol, and 0.5 Atrovent. Pt A&Ox4, respirations equal and labored, skin warm and dry

## 2014-07-19 NOTE — ED Provider Notes (Signed)
CSN: [id number]     Arrival date & time 07/19/14  2037 History   First MD Initiated Contact with Patient 07/19/14 2059     Chief Complaint  Patient presents with  . Shortness of Breath     (Consider location/radiation/quality/duration/timing/severity/associated sxs/prior Treatment) Patient is a 69 y.o. female presenting with shortness of breath.  Shortness of Breath Severity:  Moderate Onset quality:  Gradual Duration:  18 hours Timing:  Constant Progression:  Worsening Chronicity:  Recurrent Context comment:  Increased sputum production. Patient was recently admitted for respiratory distress secondary to influenza. Relieved by:  Nothing Worsened by:  Coughing and exertion Ineffective treatments:  Inhaler and rest Associated symptoms: cough ( With increased sputum production.) and wheezing   Associated symptoms: no abdominal pain, no chest pain, no claudication, no diaphoresis, no ear pain, no fever, no headaches, no hemoptysis, no rash, no sore throat, no syncope and no vomiting   Risk factors: no hx of PE/DVT   Risk factors comment:  History of COPD, CHF, recent admission for respiratory distress secondary to influenza   Past Medical History  Diagnosis Date  . Atrial fibrillation   . Hypertension   . Insomnia   . Nonischemic cardiomyopathy   . Hyperlipidemia   . Anxiety   . Cholelithiasis   . Insomnia   . Mitral regurgitation     noted 2010  . H/O epistaxis   . Rhinitis, allergic   . CHF (congestive heart failure)   . Pneumonia     "several times w/exacerbations of the COPD; nothing in the last year" (07/15/2012)  . COPD (chronic obstructive pulmonary disease)     as of 7/13 on 2-3L, pfts 10/2008 with mod obstruction  . Chronic bronchitis with COPD (chronic obstructive pulmonary disease)   . Shortness of breath     "all the time right now" (07/15/2012)  . GERD (gastroesophageal reflux disease)   . SEGBTDVV(616.0)     "weekly" (07/15/2012)  . Migraines     "weekly for  awhile; cleared up as I got older" (07/15/2012)  . DJD (degenerative joint disease)   . Arthritis     "all over" (07/15/2012)  . OCD (obsessive compulsive disorder)   . OCD (obsessive compulsive disorder)   . Depression     h/o SI; "last time I was really serious about it was ~ 1997" (07/15/2012)   Past Surgical History  Procedure Laterality Date  . Tubal ligation  1972  . Cardioversion  2003; 07/2003   Family History  Problem Relation Age of Onset  . Asthma    . Emphysema    . Allergies    . Cancer      aunt had several types of cancer  . COPD Mother   . Emphysema Mother   . Cirrhosis Father    History  Substance Use Topics  . Smoking status: Former Smoker -- 1.00 packs/day for 35 years    Types: Cigarettes    Quit date: 06/18/2014  . Smokeless tobacco: Never Used     Comment: No desire to quit at this time./ 1PPD  . Alcohol Use: No   OB History    No data available     Review of Systems  Constitutional: Negative for fever, chills, diaphoresis, appetite change and fatigue.  HENT: Negative for congestion, ear pain, facial swelling, mouth sores and sore throat.   Eyes: Negative for visual disturbance.  Respiratory: Positive for cough ( With increased sputum production.), shortness of breath and wheezing. Negative for hemoptysis  and chest tightness.   Cardiovascular: Negative for chest pain, palpitations, claudication and syncope.  Gastrointestinal: Negative for nausea, vomiting, abdominal pain, diarrhea and blood in stool.  Endocrine: Negative for cold intolerance and heat intolerance.  Genitourinary: Negative for frequency, decreased urine volume and difficulty urinating.  Musculoskeletal: Negative for back pain and neck stiffness.  Skin: Negative for rash.  Neurological: Negative for dizziness, weakness, light-headedness and headaches.  All other systems reviewed and are negative.     Allergies  Ace inhibitors; Diphenhydramine hcl; Erythromycin; Nsaids; Nyquil;  Tramadol hcl; Codeine; Tomato; Orange fruit; and Pseudoephedrine  Home Medications   Prior to Admission medications   Medication Sig Start Date End Date Taking? Authorizing Provider  albuterol (PROVENTIL HFA;VENTOLIN HFA) 108 (90 BASE) MCG/ACT inhaler Inhale 1-2 puffs into the lungs every 6 (six) hours as needed for wheezing. 04/28/14  Yes Tanna Furry, MD  albuterol (PROVENTIL) (2.5 MG/3ML) 0.083% nebulizer solution Take 3 mLs (2.5 mg total) by nebulization every 6 (six) hours as needed for wheezing. 07/08/14  Yes Theodis Blaze, MD  aspirin 325 MG tablet Take 650 mg by mouth daily. Take every day per patient   Yes Historical Provider, MD  budesonide-formoterol (SYMBICORT) 160-4.5 MCG/ACT inhaler Inhale 2 puffs into the lungs 2 (two) times daily. 06/05/13  Yes Annia Belt, MD  busPIRone (BUSPAR) 10 MG tablet Take 10 mg by mouth 3 (three) times daily. 04/22/14  Yes Historical Provider, MD  chlorpheniramine (CHLOR-TRIMETON) 4 MG tablet Take 1 tablet (4 mg total) by mouth every 8 (eight) hours as needed for allergies. 11/13/13  Yes Tasrif Ahmed, MD  diazepam (VALIUM) 2 MG tablet Take 1 tablet (2 mg total) by mouth every 12 (twelve) hours as needed for anxiety. 12/06/12  Yes Wilber Oliphant, MD  diltiazem (TIAZAC) 420 MG 24 hr capsule Take 1 capsule (420 mg total) by mouth daily. 05/09/14 08/08/15 Yes Bartholomew Crews, MD  FLUoxetine (PROZAC) 20 MG capsule TAKE THREE CAPSULES BY MOUTH ONCE DAILY Patient taking differently: Take 60 mg by mouth. TAKE THREE CAPSULES BY MOUTH ONCE DAILY 01/03/14  Yes Tasrif Ahmed, MD  furosemide (LASIX) 20 MG tablet Take 20-40 mg by mouth 3 (three) times daily as needed for fluid (fluid). Take 2 tablets (40 mg) in the am and Take 1 tablet (20 mg) in the afternoon.   Yes Historical Provider, MD  furosemide (LASIX) 20 MG tablet Take 20 mg by mouth 2 (two) times daily.   Yes Historical Provider, MD  guaiFENesin (MUCINEX) 600 MG 12 hr tablet Take 1 tablet (600 mg total) by  mouth 2 (two) times daily. Patient taking differently: Take 600 mg by mouth 2 (two) times daily as needed for cough.  04/28/14  Yes Tanna Furry, MD  HYDROcodone-acetaminophen (NORCO) 7.5-325 MG per tablet Take 1-2 tablets by mouth every 6 (six) hours as needed for moderate pain. 07/08/14 08/02/15 Yes Iskra Barbera Setters, MD  HYDROcodone-homatropine [facility]) 5-1.5 MG/5ML syrup Take 5 mLs by mouth every 6 (six) hours as needed for cough. 04/30/14  Yes Bartholomew Crews, MD  hydroxypropyl methylcellulose / hypromellose (ISOPTO TEARS / GONIOVISC) 2.5 % ophthalmic solution Place 1 drop into both eyes daily as needed for dry eyes (dry eyes).    Yes Historical Provider, MD  metoprolol succinate (TOPROL-XL) 25 MG 24 hr tablet Take 0.5 tablets (12.5 mg total) by mouth daily after lunch. 07/02/14  Yes Arnoldo Morale, MD  nystatin (MYCOSTATIN) 100000 UNIT/ML suspension Take 5 mLs (500,000 Units total) by mouth as needed.  Patient taking differently: Take 500,000 Units by mouth daily as needed.  04/16/14  Yes Tasrif Ahmed, MD  pantoprazole (PROTONIX) 40 MG tablet TAKE ONE TABLET BY MOUTH TWICE DAILY 01/03/14  Yes Tasrif Ahmed, MD  tiotropium (SPIRIVA) 18 MCG inhalation capsule Place 1 capsule (18 mcg total) into inhaler and inhale daily. 07/05/13  Yes Pollie Friar, MD   BP 109/95 mmHg  Pulse 87  Temp(Src) 99.8 F (37.7 C) (Oral)  Resp 18  Wt 178 lb (80.74 kg)  SpO2 100% Physical Exam  Constitutional: She is oriented to person, place, and time. She appears well-developed and well-nourished. No distress.  HENT:  Head: Normocephalic and atraumatic.  Right Ear: External ear normal.  Left Ear: External ear normal.  Nose: Nose normal.  Eyes: Conjunctivae and EOM are normal. Pupils are equal, round, and reactive to light. Right eye exhibits no discharge. Left eye exhibits no discharge. No scleral icterus.  Neck: Normal range of motion. Neck supple.  Cardiovascular: Normal rate, regular rhythm and normal heart sounds.   Exam reveals no gallop and no friction rub.   No murmur heard. Pulmonary/Chest: No accessory muscle usage or stridor. Tachypnea noted. She is in respiratory distress. She has wheezes. She has rales.  Abdominal: Soft. She exhibits no distension. There is no tenderness.  Musculoskeletal: She exhibits no edema or tenderness.  Neurological: She is alert and oriented to person, place, and time.  Skin: Skin is warm and dry. No rash noted. She is not diaphoretic. No erythema.  Psychiatric: She has a normal mood and affect.    ED Course  Procedures (including critical care time) Labs Review Labs Reviewed  COMPREHENSIVE METABOLIC PANEL - Abnormal; Notable for the following:    Glucose, Bld 134 (*)    Calcium 8.7 (*)    Total Protein 5.9 (*)    Albumin 2.8 (*)    AST 13 (*)    ALT 12 (*)    All other components within normal limits  CBC WITH DIFFERENTIAL/PLATELET - Abnormal; Notable for the following:    WBC 19.8 (*)    RBC 3.57 (*)    Hemoglobin 11.1 (*)    HCT 35.0 (*)    Neutrophils Relative % 86 (*)    Neutro Abs 16.9 (*)    Lymphocytes Relative 6 (*)    Monocytes Absolute 1.7 (*)    All other components within normal limits  BRAIN NATRIURETIC PEPTIDE - Abnormal; Notable for the following:    B Natriuretic Peptide 287.6 (*)    All other components within normal limits  CULTURE, EXPECTORATED SPUTUM-ASSESSMENT  COMPREHENSIVE METABOLIC PANEL  CBC  TROPONIN I  LEGIONELLA ANTIGEN, URINE  STREP PNEUMONIAE URINARY ANTIGEN  I-STAT TROPOININ, ED    Imaging Review Dg Chest 2 View  07/19/2014   CLINICAL DATA:  Shortness of breath.  Chest congestion.  COPD.  EXAM: CHEST  2 VIEW  COMPARISON:  07/15/2014  FINDINGS: Heart size remains within normal limits. Diffuse chronic pulmonary interstitial prominence remains stable. No evidence of acute pulmonary infiltrate or pleural effusion. No definite mass or lymphadenopathy identified.  IMPRESSION: Stable chronic pulmonary interstitial prominence.  No active lung disease.   Electronically Signed   By: Earle Gell M.D.   On: 07/19/2014 22:02     EKG Interpretation None      MDM   69 year old female with a history of hypertension, A. fib, CHF with a recent EF of 50-55%, COPD on 2 L home oxygen who was recently admitted late May  for respiratory distress secondary to influenza presents today with another episode of respiratory distress. Patient with increase sputum production the past several days, denies fevers. Home breathing treatments or unable to relieve her respiratory symptoms. Rest of the history and exam as above. Presentation concerning for the COPD exacerbation secondary to possible healthcare associated pneumonia. Chest x-ray with no opacities however given the patient's increased sputum production and leukocytosis, the patient was covered for HCAP. After breathing treatment and steroids patient's symptoms improved and was placed on her home 2 L nasal cannula. Patient will be admitted to hospitalist service for further management of her pneumonia.  Patient seen in conjunction with Dr. Audie Pinto.  Sibyl Parr, M.D. Resident  Final diagnoses:  COPD exacerbation  Leukocytosis  Increased sputum production        Addison Lank, MD 07/20/14 0023  Leonard Schwartz, MD 08/05/14 [phone]

## 2014-07-20 ENCOUNTER — Encounter (HOSPITAL_COMMUNITY): Payer: Self-pay | Admitting: General Practice

## 2014-07-20 DIAGNOSIS — J441 Chronic obstructive pulmonary disease with (acute) exacerbation: Principal | ICD-10-CM

## 2014-07-20 LAB — CBC
HCT: 35.7 % — ABNORMAL LOW (ref 36.0–46.0)
HEMOGLOBIN: 10.7 g/dL — AB (ref 12.0–15.0)
MCH: 29.6 pg (ref 26.0–34.0)
MCHC: 30 g/dL (ref 30.0–36.0)
MCV: 98.6 fL (ref 78.0–100.0)
Platelets: 239 10*3/uL (ref 150–400)
RBC: 3.62 MIL/uL — AB (ref 3.87–5.11)
RDW: 14.9 % (ref 11.5–15.5)
WBC: 17.5 10*3/uL — AB (ref 4.0–10.5)

## 2014-07-20 LAB — TROPONIN I

## 2014-07-20 LAB — COMPREHENSIVE METABOLIC PANEL
ALT: 13 U/L — AB (ref 14–54)
AST: 14 U/L — ABNORMAL LOW (ref 15–41)
Albumin: 2.5 g/dL — ABNORMAL LOW (ref 3.5–5.0)
Alkaline Phosphatase: 83 U/L (ref 38–126)
Anion gap: 11 (ref 5–15)
BUN: 10 mg/dL (ref 6–20)
CALCIUM: 8.6 mg/dL — AB (ref 8.9–10.3)
CHLORIDE: 98 mmol/L — AB (ref 101–111)
CO2: 26 mmol/L (ref 22–32)
CREATININE: 0.92 mg/dL (ref 0.44–1.00)
GFR calc Af Amer: >60 mL/min (ref >60)
GLUCOSE: 303 mg/dL — AB (ref 65–99)
Potassium: 4 mmol/L (ref 3.5–5.1)
SODIUM: 135 mmol/L (ref 135–145)
Total Bilirubin: 0.9 mg/dL (ref 0.3–1.2)
Total Protein: 5.5 g/dL — ABNORMAL LOW (ref 6.5–8.1)

## 2014-07-20 LAB — STREP PNEUMONIAE URINARY ANTIGEN: Strep Pneumo Urinary Antigen: NEGATIVE

## 2014-07-20 MED ORDER — METOPROLOL SUCCINATE 12.5 MG HALF TABLET
12.5000 mg | ORAL_TABLET | Freq: Every day | ORAL | Status: DC
  Administered 2014-07-20 – 2014-07-22 (×3): 12.5 mg via ORAL
  Filled 2014-07-20 (×3): qty 1

## 2014-07-20 MED ORDER — HYDROCODONE-ACETAMINOPHEN 7.5-325 MG PO TABS
1.0000 | ORAL_TABLET | Freq: Four times a day (QID) | ORAL | Status: DC | PRN
  Administered 2014-07-20 – 2014-07-22 (×8): 2 via ORAL
  Filled 2014-07-20 (×8): qty 2

## 2014-07-20 MED ORDER — IPRATROPIUM BROMIDE 0.02 % IN SOLN
0.5000 mg | RESPIRATORY_TRACT | Status: DC
  Administered 2014-07-20 (×2): 0.5 mg via RESPIRATORY_TRACT
  Filled 2014-07-20: qty 2.5

## 2014-07-20 MED ORDER — ALBUTEROL SULFATE (2.5 MG/3ML) 0.083% IN NEBU
INHALATION_SOLUTION | RESPIRATORY_TRACT | Status: AC
  Filled 2014-07-20: qty 3

## 2014-07-20 MED ORDER — GUAIFENESIN ER 600 MG PO TB12
600.0000 mg | ORAL_TABLET | Freq: Two times a day (BID) | ORAL | Status: DC | PRN
  Filled 2014-07-20: qty 1

## 2014-07-20 MED ORDER — DEXTROSE 5 % IV SOLN
100.0000 mg | Freq: Two times a day (BID) | INTRAVENOUS | Status: DC
  Administered 2014-07-20 – 2014-07-21 (×3): 100 mg via INTRAVENOUS
  Filled 2014-07-20 (×5): qty 100

## 2014-07-20 MED ORDER — SODIUM CHLORIDE 0.9 % IJ SOLN
3.0000 mL | Freq: Two times a day (BID) | INTRAMUSCULAR | Status: DC
  Administered 2014-07-21 – 2014-07-22 (×2): 3 mL via INTRAVENOUS

## 2014-07-20 MED ORDER — PANTOPRAZOLE SODIUM 40 MG PO TBEC
40.0000 mg | DELAYED_RELEASE_TABLET | Freq: Every day | ORAL | Status: DC
  Administered 2014-07-20 – 2014-07-22 (×3): 40 mg via ORAL
  Filled 2014-07-20: qty 1

## 2014-07-20 MED ORDER — IPRATROPIUM-ALBUTEROL 0.5-2.5 (3) MG/3ML IN SOLN
RESPIRATORY_TRACT | Status: AC
  Administered 2014-07-20: 3 mL via RESPIRATORY_TRACT
  Filled 2014-07-20: qty 3

## 2014-07-20 MED ORDER — DILTIAZEM HCL ER BEADS 300 MG PO CP24
420.0000 mg | ORAL_CAPSULE | Freq: Every day | ORAL | Status: DC
  Administered 2014-07-20 – 2014-07-22 (×3): 420 mg via ORAL
  Filled 2014-07-20 (×3): qty 1

## 2014-07-20 MED ORDER — BUSPIRONE HCL 10 MG PO TABS
10.0000 mg | ORAL_TABLET | Freq: Three times a day (TID) | ORAL | Status: DC
  Administered 2014-07-20 – 2014-07-22 (×7): 10 mg via ORAL
  Filled 2014-07-20 (×11): qty 1

## 2014-07-20 MED ORDER — ONDANSETRON HCL 4 MG/2ML IJ SOLN: 4.0000 mg | Freq: Four times a day (QID) | INTRAMUSCULAR | Status: DC | PRN

## 2014-07-20 MED ORDER — BUDESONIDE-FORMOTEROL FUMARATE 160-4.5 MCG/ACT IN AERO
2.0000 | INHALATION_SPRAY | Freq: Two times a day (BID) | RESPIRATORY_TRACT | Status: DC
  Administered 2014-07-20 – 2014-07-22 (×5): 2 via RESPIRATORY_TRACT
  Filled 2014-07-20: qty 6

## 2014-07-20 MED ORDER — DIAZEPAM 2 MG PO TABS: 2.0000 mg | ORAL_TABLET | Freq: Two times a day (BID) | ORAL | Status: DC | PRN

## 2014-07-20 MED ORDER — TIOTROPIUM BROMIDE MONOHYDRATE 18 MCG IN CAPS
18.0000 ug | ORAL_CAPSULE | Freq: Every day | RESPIRATORY_TRACT | Status: DC
  Filled 2014-07-20: qty 5

## 2014-07-20 MED ORDER — PREDNISONE 50 MG PO TABS
50.0000 mg | ORAL_TABLET | Freq: Every day | ORAL | Status: DC
  Administered 2014-07-20 – 2014-07-22 (×3): 50 mg via ORAL
  Filled 2014-07-20 (×4): qty 1

## 2014-07-20 MED ORDER — FLUOXETINE HCL 20 MG PO CAPS
60.0000 mg | ORAL_CAPSULE | Freq: Every day | ORAL | Status: DC
Start: 2014-07-20 — End: 2014-07-22
  Administered 2014-07-20 – 2014-07-22 (×3): 60 mg via ORAL
  Filled 2014-07-20 (×3): qty 3

## 2014-07-20 MED ORDER — ACETAMINOPHEN 650 MG RE SUPP: 650.0000 mg | Freq: Four times a day (QID) | RECTAL | Status: DC | PRN

## 2014-07-20 MED ORDER — ACETAMINOPHEN 325 MG PO TABS: 650.0000 mg | ORAL_TABLET | Freq: Four times a day (QID) | ORAL | Status: DC | PRN

## 2014-07-20 MED ORDER — VANCOMYCIN HCL IN DEXTROSE 1-5 GM/200ML-% IV SOLN
1000.0000 mg | Freq: Two times a day (BID) | INTRAVENOUS | Status: DC
  Filled 2014-07-20: qty 200

## 2014-07-20 MED ORDER — ONDANSETRON HCL 4 MG PO TABS: 4.0000 mg | ORAL_TABLET | Freq: Four times a day (QID) | ORAL | Status: DC | PRN

## 2014-07-20 MED ORDER — ENOXAPARIN SODIUM 40 MG/0.4ML ~~LOC~~ SOLN
40.0000 mg | Freq: Every day | SUBCUTANEOUS | Status: DC
  Administered 2014-07-20 – 2014-07-22 (×3): 40 mg via SUBCUTANEOUS
  Filled 2014-07-20 (×3): qty 0.4

## 2014-07-20 MED ORDER — IPRATROPIUM-ALBUTEROL 0.5-2.5 (3) MG/3ML IN SOLN
3.0000 mL | RESPIRATORY_TRACT | Status: DC
  Administered 2014-07-20 – 2014-07-21 (×8): 3 mL via RESPIRATORY_TRACT
  Filled 2014-07-20 (×8): qty 3

## 2014-07-20 MED ORDER — FUROSEMIDE 20 MG PO TABS
20.0000 mg | ORAL_TABLET | Freq: Two times a day (BID) | ORAL | Status: DC
  Administered 2014-07-20 – 2014-07-22 (×5): 20 mg via ORAL
  Filled 2014-07-20 (×7): qty 1

## 2014-07-20 MED ORDER — ALBUTEROL SULFATE (2.5 MG/3ML) 0.083% IN NEBU
2.5000 mg | INHALATION_SOLUTION | RESPIRATORY_TRACT | Status: DC
  Administered 2014-07-20 (×2): 2.5 mg via RESPIRATORY_TRACT
  Filled 2014-07-20: qty 3

## 2014-07-20 MED ORDER — PIPERACILLIN-TAZOBACTAM 3.375 G IVPB
3.3750 g | Freq: Three times a day (TID) | INTRAVENOUS | Status: DC
  Filled 2014-07-20 (×2): qty 50

## 2014-07-20 MED ORDER — IPRATROPIUM BROMIDE 0.02 % IN SOLN
RESPIRATORY_TRACT | Status: AC
  Filled 2014-07-20: qty 2.5

## 2014-07-20 MED ORDER — GUAIFENESIN ER 600 MG PO TB12
600.0000 mg | ORAL_TABLET | Freq: Two times a day (BID) | ORAL | Status: DC
  Administered 2014-07-20 – 2014-07-22 (×6): 600 mg via ORAL
  Filled 2014-07-20 (×8): qty 1

## 2014-07-20 MED ORDER — ASPIRIN 325 MG PO TABS
650.0000 mg | ORAL_TABLET | Freq: Every day | ORAL | Status: DC
  Administered 2014-07-20 – 2014-07-22 (×3): 650 mg via ORAL
  Filled 2014-07-20 (×3): qty 2

## 2014-07-20 MED ORDER — TIOTROPIUM BROMIDE MONOHYDRATE 18 MCG IN CAPS
18.0000 ug | ORAL_CAPSULE | Freq: Every day | RESPIRATORY_TRACT | Status: DC
  Administered 2014-07-20 – 2014-07-22 (×3): 18 ug via RESPIRATORY_TRACT
  Filled 2014-07-20: qty 5

## 2014-07-20 NOTE — Evaluation (Signed)
Occupational Therapy Evaluation Patient Details Name: Kelsey Garcia MRN: [id number] DOB: Jan 23, 1946 Today's Date: 07/20/2014    History of Present Illness This 69 y.o. female admitted with sudden onset of SOB.  Dx: COPD exacerbation, acute on chronic hypoxemic respiratory failure, suspected acute bacterial bronchitis vs. atypical PNA.  PMH: COPD, A-Fib, HTN, chronic diastolic dysfunction, mood disorder   Clinical Impression   Pt admitted with above. She demonstrates the below listed deficits and will benefit from continued OT to maximize safety and independence with BADLs.  Pt demonstrates generalized weakness/deconditioning.  She is able to perform BADLs with min guard assist, but fatigues quickly with minimal activity.  HR 98 and 02 sats 97% on 2L 02.  Will follow acutely.       Follow Up Recommendations  Home health OT;Supervision/Assistance - 24 hour    Equipment Recommendations  None recommended by OT    Recommendations for Other Services       Precautions / Restrictions Precautions Precautions: Fall Restrictions Weight Bearing Restrictions: No      Mobility Bed Mobility Overal bed mobility: Modified Independent                Transfers Overall transfer level: Needs assistance   Transfers: Sit to/from Stand;Stand Pivot Transfers Sit to Stand: Min guard Stand pivot transfers: Min guard       General transfer comment: min guard for safety     Balance Overall balance assessment: Needs assistance Sitting-balance support: Feet supported Sitting balance-Leahy Scale: Good     Standing balance support: Single extremity supported Standing balance-Leahy Scale: Fair                              ADL Overall ADL's : Needs assistance/impaired Eating/Feeding: Independent;Sitting   Grooming: Wash/dry hands;Wash/dry face;Oral care;Brushing hair;Set up;Sitting   Upper Body Bathing: Set up;Sitting   Lower Body Bathing: Min guard;Sit to/from stand   Upper Body Dressing : Set up;Sitting   Lower Body Dressing: Min guard;Sit to/from stand   Toilet Transfer: Min guard;Ambulation;Comfort height toilet;Grab bars;RW Armed forces technical officer Details (indicate cue type and reason): verbal cues for walker safety  Toileting- Clothing Manipulation and Hygiene: Min guard;Sit to/from stand       Functional mobility during ADLs: Min guard;Rolling walker General ADL Comments: Pt fatigues rapidly      Vision     Perception     Praxis      Pertinent Vitals/Pain Pain Assessment: Faces Faces Pain Scale: Hurts little more Pain Location: Pt talked about having pain, but became distracted and did not clarify location.  Pain did not limit activity      Hand Dominance Right   Extremity/Trunk Assessment Upper Extremity Assessment Upper Extremity Assessment: Generalized weakness   Lower Extremity Assessment Lower Extremity Assessment: Defer to PT evaluation   Cervical / Trunk Assessment Cervical / Trunk Assessment: Kyphotic   Communication Communication Communication: No difficulties   Cognition Arousal/Alertness: Awake/alert Behavior During Therapy: WFL for tasks assessed/performed Overall Cognitive Status: Within Functional Limits for tasks assessed                     General Comments       Exercises       Shoulder Instructions      Home Living Family/patient expects to be discharged to:: Private residence Living Arrangements: Children Available Help at Discharge: Family;Available 24 hours/day Type of Home: Apartment (townhome ) Home Access: Stairs to enter Entrance  Stairs-Number of Steps: 5-6 stairs down, then long sidewalk with one step to another sidewalk and then another step onto porch  Entrance Stairs-Rails: Left Home Layout: Bed/bath upstairs;Two level Alternate Level Stairs-Number of Steps: 14 Alternate Level Stairs-Rails: Right;Left Bathroom Shower/Tub: Tub/shower unit;Curtain Shower/tub characteristics:  Architectural technologist: Standard     Home Equipment: Environmental consultant - 2 wheels;Wheelchair - power;Shower seat;Grab bars - tub/shower;Bedside commode          Prior Functioning/Environment Level of Independence: Needs assistance  Gait / Transfers Assistance Needed: Pt reports she ambulates short distances  with the equivalency of a cane  ADL's / Homemaking Assistance Needed: Reports she requires occasional assist for bathing    Comments: Pt reports she spends most of her times upstairs since bed and bath are up there.   She tries to go up and down the steps 2x/day     OT Diagnosis: Generalized weakness   OT Problem List: Decreased strength;Decreased activity tolerance;Impaired balance (sitting and/or standing);Decreased safety awareness;Decreased knowledge of use of DME or AE;Cardiopulmonary status limiting activity   OT Treatment/Interventions: Self-care/ADL training;Therapeutic exercise;Energy conservation;DME and/or AE instruction;Therapeutic activities;Patient/family education;Balance training    OT Goals(Current goals can be found in the care plan section) Acute Rehab OT Goals Patient Stated Goal: To go home ASAP  OT Goal Formulation: With patient Time For Goal Achievement: 07/27/14 Potential to Achieve Goals: Good ADL Goals Pt Will Perform Grooming: with supervision;standing Pt Will Perform Upper Body Bathing: with supervision;sitting Pt Will Perform Lower Body Bathing: with supervision;sit to/from stand Pt Will Perform Lower Body Dressing: with supervision;sit to/from stand Pt Will Perform Toileting - Clothing Manipulation and hygiene: with supervision;sit to/from stand Pt Will Perform Tub/Shower Transfer: Tub transfer;with min guard assist;ambulating;shower seat;rolling walker Pt/caregiver will Perform Home Exercise Program: Increased strength;Right Upper extremity;Left upper extremity;With theraband Additional ADL Goal #1: Pt will be independent with energy conservation  techniques   OT Frequency: Min 2X/week   Barriers to D/C:            Co-evaluation              End of Session Equipment Utilized During Treatment: Rolling walker;Oxygen Nurse Communication: Mobility status  Activity Tolerance: Patient limited by fatigue Patient left: in bed;with call bell/phone within reach   Time: 1459-1522 OT Time Calculation (min): 23 min Charges:  OT General Charges $OT Visit: 1 Procedure OT Evaluation $Initial OT Evaluation Tier I: 1 Procedure OT Treatments $Therapeutic Activity: 8-22 mins G-Codes:    Jodel Mayhall M July 28, 2014, 4:00 PM

## 2014-07-20 NOTE — Hospital Discharge Follow-Up (Signed)
This CM met with patient at bedside to discuss [facility], and the services, medical management clinic can provide. Patient declined follow-up at [facility]. She indicates she has an appointment at [facility] on 07/24/14.

## 2014-07-20 NOTE — Progress Notes (Addendum)
Inpatient Diabetes Program Recommendations  AACE/ADA: New Consensus Statement on Inpatient Glycemic Control (2013)  Target Ranges:  Prepandial:   less than 140 mg/dL      Peak postprandial:   less than 180 mg/dL (1-2 hours)      Critically ill patients:  140 - 180 mg/dL   Note that lab glucose elevated.   Results for Kelsey Garcia, Kelsey Garcia (MRN [id number]) as of 07/20/2014 09:40  Ref. Range 07/19/2014 20:56 07/20/2014 02:55  Glucose Latest Ref Range: 65-99 mg/dL 134 (H) 303 (H)   Diabetes history: None documented  Note that patient received dose of Prednisone last night which likely increased glucose.  Please consider checking CBG's tid with meals and HS and add Novolog moderate correction.  Thanks, Adah Perl, RN, BC-ADM Inpatient Diabetes Coordinator Pager [phone] (8a-5p)

## 2014-07-20 NOTE — Progress Notes (Signed)
[facility] PROGRESS NOTE  Kelsey Garcia JJK:[id number] DOB: [birth year] DOA: 07/19/2014 PCP: No PCP Per Patient  Assessment/Plan: 1. Chronic obstructive pulmonary disease exacerbation -Patient presenting with complaints of cough associated with shortness of breath and wheezing. Has a history of COPD and chronic hypoxemic respiratory failure. -Will continue prednisone along with nebulizer treatments and empiric antibiotic therapy.  2.  Acute on chronic hypoxemic respiratory failure -Evidenced by a respiratory rate of 24 with an increase in oxygen requirement at 5 L nasal cannula. -Likely secondary to COPD exacerbation -Showing clinical improvement on this mornings evaluation, will continue steroids, nebs, antibiotic  3.  Atrial fibrillation CHADSVasc score of 3 -Rate controlled on calcium channel blocker therapy -Patient currently not on anticoagulation  4.  Hypertension -Blood pressures are stable, continue Cardizem and metoprolol 12.5 mg daily  5.  Suspected acute bacterial bronchitis versus atypical pneumonia -This possible underlying infectious process may have precipitated COPD exacerbation -She is being treated with doxycycline 100 mg IV every 24 hours   Code Status: Full code Family Communication: Family not present Disposition Plan: Discrete discharge home when medically stable   Antibiotics: Doxycycline  HPI/Subjective: Patient is a 69 year old with a past medical history of chronic obstructive pulmonary disease, chronic hypoxemic respiratory failure on 2 L supplemental oxygen via nasal cannula at baseline, atrial fibrillation, admitted to the medicine service on 07/19/2014 when she presented with complaints of worsening shortness of breath associate with cough and wheezing. A chest x-ray performed on admission did not show acute cardiopulmonary disease. Symptoms not to be due to COPD exacerbation as she was started on systemic steroids, nebs and empiric antibiotic  therapy.  Objective: Filed Vitals:   07/20/14 0650  BP: 116/49  Pulse: 97  Temp: 97.4 F (36.3 C)  Resp: 20    Intake/Output Summary (Last 24 hours) at 07/20/14 1313 Last data filed at 07/20/14 1100  Gross per 24 hour  Intake    250 ml  Output    300 ml  Net    -50 ml   Filed Weights   07/19/14 2106 07/20/14 0103  Weight: 80.74 kg (178 lb) 80.6 kg (177 lb 11.1 oz)    Exam:   General:  Patient is awake and alert, oriented in no acute distress states feeling little better this morning  Cardiovascular: Regular rate and rhythm normal S1-S2 without murmurs rubs or gallops extremity edema  Respiratory: She has diminished breath sounds bilaterally with extensive bilateral expiratory wheezing positive rhonchi  Abdomen: Soft nontender nondistended  Musculoskeletal: No edema  Data Reviewed: Basic Metabolic Panel:  Recent Labs Lab 07/19/14 2056 07/20/14 0255  NA 137 135  K 3.9 4.0  CL 101 98*  CO2 26 26  GLUCOSE 134* 303*  BUN 7 10  CREATININE 0.75 0.92  CALCIUM 8.7* 8.6*   Liver Function Tests:  Recent Labs Lab 07/19/14 2056 07/20/14 0255  AST 13* 14*  ALT 12* 13*  ALKPHOS 87 83  BILITOT 0.9 0.9  PROT 5.9* 5.5*  ALBUMIN 2.8* 2.5*   No results for input(s): LIPASE, AMYLASE in the last 168 hours. No results for input(s): AMMONIA in the last 168 hours. CBC:  Recent Labs Lab 07/19/14 2056 07/20/14 0255  WBC 19.8* 17.5*  NEUTROABS 16.9*  --   HGB 11.1* 10.7*  HCT 35.0* 35.7*  MCV 98.0 98.6  PLT 233 239   Cardiac Enzymes:  Recent Labs Lab 07/20/14 0255  TROPONINI <0.03   BNP (last 3 results)  Recent Labs  06/19/14 1032 06/19/14  1745 07/19/14 2056  BNP 418.5* 441.4* 287.6*    ProBNP (last 3 results) No results for input(s): PROBNP in the last 8760 hours.  CBG: No results for input(s): GLUCAP in the last 168 hours.  No results found for this or any previous visit (from the past 240 hour(s)).   Studies: Dg Chest 2  View  07/19/2014   CLINICAL DATA:  Shortness of breath.  Chest congestion.  COPD.  EXAM: CHEST  2 VIEW  COMPARISON:  07/15/2014  FINDINGS: Heart size remains within normal limits. Diffuse chronic pulmonary interstitial prominence remains stable. No evidence of acute pulmonary infiltrate or pleural effusion. No definite mass or lymphadenopathy identified.  IMPRESSION: Stable chronic pulmonary interstitial prominence. No active lung disease.   Electronically Signed   By: Earle Gell M.D.   On: 07/19/2014 22:02    Scheduled Meds: . aspirin  650 mg Oral Daily  . budesonide-formoterol  2 puff Inhalation BID  . busPIRone  10 mg Oral TID  . diltiazem  420 mg Oral Daily  . doxycycline (VIBRAMYCIN) IV  100 mg Intravenous Q12H  . enoxaparin (LOVENOX) injection  40 mg Subcutaneous Daily  . FLUoxetine  60 mg Oral Daily  . furosemide  20 mg Oral BID  . guaiFENesin  600 mg Oral BID  . ipratropium-albuterol  3 mL Nebulization Q4H  . metoprolol succinate  12.5 mg Oral QPC lunch  . pantoprazole  40 mg Oral Daily  . predniSONE  50 mg Oral Q breakfast  . sodium chloride  3 mL Intravenous Q12H   Continuous Infusions:   Principal Problem:   COPD exacerbation Active Problems:   Essential hypertension   GERD (gastroesophageal reflux disease)   Chronic diastolic CHF (congestive heart failure)   Atrial fibrillation   Chronic respiratory failure with hypoxia    Time spent: 35 minutes    Kelvin Cellar  [facility] Pager [phone]. If 7PM-7AM, please contact night-coverage at [email or web address], password [facility] 07/20/2014, 1:13 PM  LOS: 0 days

## 2014-07-20 NOTE — Progress Notes (Signed)
ANTIBIOTIC CONSULT NOTE - INITIAL  Pharmacy Consult for Vancomycin  Indication: rule out pneumonia  Allergies  Allergen Reactions  . Ace Inhibitors Cough  . Diphenhydramine Hcl Other (See Comments)    "feels like my skin is crawling, and legs twitches"  . Erythromycin Diarrhea  . Nsaids Nausea And Vomiting  . Nyquil [Pseudoeph-Doxylamine-Dm-Apap] Itching  . Tramadol Hcl Other (See Comments)    stomach pain, hallucinations  . Codeine Swelling    Face and lips swelling  Patient report Can tolerate hydrocodone  . Tomato Other (See Comments)    Stomach swells  . Orange Fruit [Citrus] Rash  . Pseudoephedrine Palpitations    Patient Measurements: Height: '5\' 5"'$  (165.1 cm) Weight: 177 lb 11.1 oz (80.6 kg) IBW/kg (Calculated) : 57  Vital Signs: Temp: 98.7 F (37.1 C) (06/10 0103) Temp Source: Oral (06/10 0103) BP: 98/63 mmHg (06/10 0103) Pulse Rate: 89 (06/10 0103)  Labs:  Recent Labs  07/19/14 2056  WBC 19.8*  HGB 11.1*  PLT 233  CREATININE 0.75   Estimated Creatinine Clearance: 71.5 mL/min (by C-G formula based on Cr of 0.75).   Microbiology: Recent Results (from the past 720 hour(s))  Stool culture     Status: None   Collection Time: 07/05/14  6:44 PM  Result Value Ref Range Status   Specimen Description STOOL  Final   Special Requests NONE  Final   Culture   Final    NO SALMONELLA, SHIGELLA, CAMPYLOBACTER, YERSINIA, OR E.COLI 0157:[id number] ISOLATED Note: REDUCED NORMAL FLORA PRESENT Performed at [facility]    Report Status 07/09/2014 FINAL  Final  Clostridium Difficile by PCR     Status: None   Collection Time: 07/05/14  6:44 PM  Result Value Ref Range Status   C difficile by pcr NEGATIVE NEGATIVE Final    Assessment: 69 y/o F here with shortness of breath, WBC elevated, renal function ok, other labs as above.   Goal of Therapy:  Vancomycin trough level 15-20 mcg/ml  Plan:  -Vancomycin 1000 mg IV q12h -Zosyn per MD -Trend WBC, temp, renal  function  -Drug levels as indicated   Narda Bonds 07/20/2014,1:22 AM

## 2014-07-20 NOTE — Consult Note (Signed)
   [facility] Inpatient Consult   07/20/2014  Kelsey Garcia Apr 23, 1945 [id number]   Patient evaluated for [facility] services based on number of hospital admissions and dx. Spoke with inpatient RNCM prior to visiting patient. Spoke with patient at bedside to explain and offer [facility] Management. Before writer could finish speaking, patient interrupts by saying " I am covered with [name]"." I have a nurse that comes to see me and I am well covered". She states she is not interested in [facility] services. [facility] brochure left at bedside. Will make inpatient RNCM aware of conversation with patient.    [name], MSN-Ed, RN,BSN [facility] Liaison [phone]

## 2014-07-20 NOTE — Progress Notes (Addendum)
Received order for an inpt rehab consult by Dr. Coralyn Pear for pt with a CVA. Noted this pt without a CVA . We will defer consult at this time. [phone]

## 2014-07-20 NOTE — Evaluation (Signed)
Physical Therapy Evaluation Patient Details Name: Kelsey Garcia MRN: [id number] DOB: 05/26/1945 Today's Date: 07/20/2014   History of Present Illness  This 69 y.o. female admitted with sudden onset of SOB.  Dx: COPD exacerbation, acute on chronic hypoxemic respiratory failure, suspected acute bacterial bronchitis vs. atypical PNA.  PMH: COPD, A-Fib, HTN, chronic diastolic dysfunction, mood disorder  Clinical Impression  Patient with problems listed below.  Will benefit from acute PT to maximize functional independence prior to discharge home with family.    Follow Up Recommendations Home health PT;Supervision/Assistance - 24 hour (Uses [facility] pta)    Equipment Recommendations  None recommended by PT    Recommendations for Other Services       Precautions / Restrictions Precautions Precautions: Fall Restrictions Weight Bearing Restrictions: No      Mobility  Bed Mobility Overal bed mobility: Modified Independent             General bed mobility comments: Good sitting balance once upright.  Transfers Overall transfer level: Needs assistance   Transfers: Sit to/from Stand;Stand Pivot Transfers Sit to Stand: Min guard Stand pivot transfers: Min guard       General transfer comment: Patient declined OOB due to fatigue.  Ambulation/Gait                Stairs            Wheelchair Mobility    Modified Rankin (Stroke Patients Only)       Balance Overall balance assessment: Needs assistance Sitting-balance support: No upper extremity supported Sitting balance-Leahy Scale: Good     Standing balance support: Single extremity supported Standing balance-Leahy Scale: Fair                               Pertinent Vitals/Pain Pain Assessment: 0-10 Pain Score: 9  Faces Pain Scale: Hurts little more Pain Location: Rt shoulder Pain Descriptors / Indicators: Aching Pain Intervention(s): Patient requesting pain meds-RN notified     Home Living Family/patient expects to be discharged to:: Private residence Living Arrangements: Children Available Help at Discharge: Family;Available 24 hours/day (Daughter and granddaughter) Type of Home: House (Townhouse) Home Access: Stairs to enter Entrance Stairs-Rails: Left Entrance Stairs-Number of Steps: 5-6 stairs down, then long sidewalk with one step to another sidewalk and then another step onto Universal: Two level;Bed/bath upstairs (Patient stays upstairs most of the day) Home Equipment: Walker - 2 wheels;Wheelchair - power;Shower seat;Grab bars - tub/shower;Bedside commode (O2 sat monitor)      Prior Function Level of Independence: Independent with assistive device(s);Needs assistance   Gait / Transfers Assistance Needed: Uses small cart as cane for ambulation short distances.  Uses electric w/c for longer distances out of house.  ADL's / Homemaking Assistance Needed: Reports she requires occasional assist for bathing   Comments: Pt reports she spends most of her times upstairs since bed and bath are up there.   She tries to go up and down the steps 2x/day      Hand Dominance   Dominant Hand: Right    Extremity/Trunk Assessment   Upper Extremity Assessment: Generalized weakness           Lower Extremity Assessment: Generalized weakness      Cervical / Trunk Assessment: Kyphotic  Communication   Communication: No difficulties  Cognition Arousal/Alertness: Awake/alert Behavior During Therapy: WFL for tasks assessed/performed Overall Cognitive Status: Within Functional Limits for tasks assessed  General Comments General comments (skin integrity, edema, etc.): HR 98 and sats 97% on 2L 02    Exercises        Assessment/Plan    PT Assessment Patient needs continued PT services  PT Diagnosis Difficulty walking;Generalized weakness;Acute pain   PT Problem List Decreased strength;Decreased activity  tolerance;Decreased balance;Decreased mobility;Decreased knowledge of use of DME;Cardiopulmonary status limiting activity;Decreased skin integrity  PT Treatment Interventions DME instruction;Gait training;Stair training;Functional mobility training;Therapeutic activities;Patient/family education   PT Goals (Current goals can be found in the Care Plan section) Acute Rehab PT Goals Patient Stated Goal: To go home ASAP  PT Goal Formulation: With patient Time For Goal Achievement: 07/27/14 Potential to Achieve Goals: Fair    Frequency Min 3X/week   Barriers to discharge Inaccessible home environment Second story bedroom    Co-evaluation               End of Session Equipment Utilized During Treatment: Oxygen Activity Tolerance: Patient limited by fatigue;Patient limited by pain Patient left: in bed;with call bell/phone within reach Nurse Communication: Patient requests pain meds    Functional Assessment Tool Used: Clinical judgement Functional Limitation: Mobility: Walking and moving around Mobility: Walking and Moving Around Current Status (V4008): At least 20 percent but less than 40 percent impaired, limited or restricted Mobility: Walking and Moving Around Goal Status [id number]): At least 1 percent but less than 20 percent impaired, limited or restricted    Time: 5093-2671 PT Time Calculation (min) (ACUTE ONLY): 22 min   Charges:   PT Evaluation $Initial PT Evaluation Tier I: 1 Procedure     PT G Codes:   PT G-Codes **NOT FOR INPATIENT CLASS** Functional Assessment Tool Used: Clinical judgement Functional Limitation: Mobility: Walking and moving around Mobility: Walking and Moving Around Current Status (I4580): At least 20 percent but less than 40 percent impaired, limited or restricted Mobility: Walking and Moving Around Goal Status [id number]): At least 1 percent but less than 20 percent impaired, limited or restricted    Despina Pole 07/20/2014, 6:29 PM [name].  Sanjuana Kava, [place] Pager [phone]

## 2014-07-20 NOTE — Care Management Note (Addendum)
Case Management Note  Patient Details  Name: Kelsey Garcia MRN: [id number] Date of Birth: 12-22-1945  Subjective/Objective:    Pt admitted with COPD excerbation         Action/Plan:  Pt would benefit from PT/OT eval due to deconditioned state.  CM requested eval order.  Pt is from home.  CM will continue to monitor for disposition needs.   Expected Discharge Date:                  Expected Discharge Plan:  [facility]  In-House Referral:     Discharge planning Services  CM Consult  Post Acute Care Choice:    Choice offered to:     DME Arranged:    DME Agency:   [name]  HH Arranged:    [name] Agency:   [name]  Status of Service:  In process, will continue to follow  Medicare Important Message Given:  Yes Date Medicare IM Given:  07/20/14 Medicare IM give by:  [name] Date Additional Medicare IM Given:    Additional Medicare Important Message give by:     If discussed at [place] of Stay Meetings, dates discussed:    Additional Comments: 07/20/14 [name], RN, BSN [phone] CM assessed pt.  Pt appeared deconditioned and out of breath with conversation, CM recommended PT/OT eval for pt to MD.  Pt is active with [facility] for RN,PT, OT.  Pt is also active with [name] for home O2. CM contacted [facility] and informed of admit.  CM will ask for resumption orders prior to discharge. Pt stated she has electric wheelchair and walker at home. [facility] attempted to establish relationship with pt, however pt refused, [facility] will continue to follow.  CM will continue to monitor for disposition needs.    [name], RN 07/20/2014, 10:41 AM

## 2014-07-21 DIAGNOSIS — J9611 Chronic respiratory failure with hypoxia: Secondary | ICD-10-CM

## 2014-07-21 DIAGNOSIS — J441 Chronic obstructive pulmonary disease with (acute) exacerbation: Secondary | ICD-10-CM | POA: Diagnosis not present

## 2014-07-21 DIAGNOSIS — I482 Chronic atrial fibrillation: Secondary | ICD-10-CM

## 2014-07-21 DIAGNOSIS — I10 Essential (primary) hypertension: Secondary | ICD-10-CM

## 2014-07-21 LAB — BASIC METABOLIC PANEL
ANION GAP: 10 (ref 5–15)
BUN: 14 mg/dL (ref 6–20)
CHLORIDE: 103 mmol/L (ref 101–111)
CO2: 26 mmol/L (ref 22–32)
Calcium: 9.2 mg/dL (ref 8.9–10.3)
Creatinine, Ser: 0.97 mg/dL (ref 0.44–1.00)
GFR calc Af Amer: >60 mL/min (ref >60)
GFR, EST NON AFRICAN AMERICAN: 59 mL/min — AB (ref >60)
GLUCOSE: 180 mg/dL — AB (ref 65–99)
Potassium: 4.7 mmol/L (ref 3.5–5.1)
SODIUM: 139 mmol/L (ref 135–145)

## 2014-07-21 LAB — CBC
HEMATOCRIT: 31.8 % — AB (ref 36.0–46.0)
Hemoglobin: 10.2 g/dL — ABNORMAL LOW (ref 12.0–15.0)
MCH: 31.1 pg (ref 26.0–34.0)
MCHC: 32.1 g/dL (ref 30.0–36.0)
MCV: 97 fL (ref 78.0–100.0)
Platelets: 241 10*3/uL (ref 150–400)
RBC: 3.28 MIL/uL — ABNORMAL LOW (ref 3.87–5.11)
RDW: 14.6 % (ref 11.5–15.5)
WBC: 16.7 10*3/uL — ABNORMAL HIGH (ref 4.0–10.5)

## 2014-07-21 MED ORDER — IPRATROPIUM-ALBUTEROL 0.5-2.5 (3) MG/3ML IN SOLN
3.0000 mL | Freq: Two times a day (BID) | RESPIRATORY_TRACT | Status: DC
  Administered 2014-07-22: 3 mL via RESPIRATORY_TRACT
  Filled 2014-07-21: qty 3

## 2014-07-21 MED ORDER — DOXYCYCLINE HYCLATE 100 MG PO TABS
100.0000 mg | ORAL_TABLET | Freq: Two times a day (BID) | ORAL | Status: DC
  Administered 2014-07-21 – 2014-07-22 (×2): 100 mg via ORAL
  Filled 2014-07-21 (×3): qty 1

## 2014-07-21 MED ORDER — IPRATROPIUM-ALBUTEROL 0.5-2.5 (3) MG/3ML IN SOLN: 3.0000 mL | RESPIRATORY_TRACT | Status: DC | PRN

## 2014-07-21 NOTE — Progress Notes (Signed)
Physical Therapy Treatment Patient Details Name: Kelsey Garcia MRN: [id number] DOB: [birth year] Today's Date: 08/16/2014    History of Present Illness This 69 y.o. female admitted with sudden onset of SOB.  Dx: COPD exacerbation, acute on chronic hypoxemic respiratory failure, suspected acute bacterial bronchitis vs. atypical PNA.  PMH: COPD, A-Fib, HTN, chronic diastolic dysfunction, mood disorder    PT Comments    Patient demonstrates good balance with gait with no assistive device.  Limited by dyspnea.  Follow Up Recommendations  Home health PT;Supervision/Assistance - 24 hour     Equipment Recommendations  None recommended by PT    Recommendations for Other Services       Precautions / Restrictions Precautions Precautions: Fall Restrictions Weight Bearing Restrictions: No    Mobility  Bed Mobility Overal bed mobility: Modified Independent                Transfers Overall transfer level: Modified independent Equipment used: None Transfers: Sit to/from Stand Sit to Stand: Modified independent (Device/Increase time)         General transfer comment: Patient requires increased time for transfers.  Steady in stance  Ambulation/Gait Ambulation/Gait assistance: Modified independent (Device/Increase time) Ambulation Distance (Feet): 40 Feet Assistive device: None Gait Pattern/deviations: Step-through pattern Gait velocity: Decreased Gait velocity interpretation: Below normal speed for age/gender General Gait Details: Patient with good gait pattern and balance.  Slightly slow gait to minimize dyspnea.   Stairs            Wheelchair Mobility    Modified Rankin (Stroke Patients Only)       Balance   Sitting-balance support: No upper extremity supported;Feet unsupported Sitting balance-Leahy Scale: Normal     Standing balance support: No upper extremity supported Standing balance-Leahy Scale: Good                      Cognition  Arousal/Alertness: Awake/alert Behavior During Therapy: WFL for tasks assessed/performed;Anxious Overall Cognitive Status: Within Functional Limits for tasks assessed                      Exercises      General Comments        Pertinent Vitals/Pain Pain Assessment: 0-10 Pain Score: 3  Pain Location: Rt shoulder Pain Descriptors / Indicators: Sore Pain Intervention(s): Repositioned    Home Living                      Prior Function            PT Goals (current goals can now be found in the care plan section) Progress towards PT goals: Progressing toward goals    Frequency  Min 3X/week    PT Plan Current plan remains appropriate    Co-evaluation             End of Session Equipment Utilized During Treatment: Oxygen Activity Tolerance: Patient tolerated treatment well (Dyspnea 2/4 with ambulation) Patient left: in bed;with call bell/phone within reach     Time: 1112-1121 PT Time Calculation (min) (ACUTE ONLY): 9 min  Charges:  $Gait Training: 8-22 mins                    G Codes:      Kelsey Garcia 2014-08-16, 1:28 PM Kelsey Garcia. Kelsey Garcia, Kelsey Garcia Pager [phone]

## 2014-07-21 NOTE — Progress Notes (Signed)
[facility] PROGRESS NOTE  Kelsey Garcia JQB:[id number] DOB: 1945-05-02 DOA: 07/19/2014 PCP: No PCP Per Patient  Assessment/Plan: 1. Chronic obstructive pulmonary disease exacerbation -Patient presenting with complaints of cough associated with shortness of breath and wheezing. Has a history of COPD and chronic hypoxemic respiratory failure. -Will continue prednisone along with nebulizer treatments and empiric antibiotic therapy. -Patient showing some improvement, anticipate discharge in the 24 hours  2.  Acute on chronic hypoxemic respiratory failure -Evidenced by a respiratory rate of 24 with an increase in oxygen requirement at 5 L nasal cannula. -Likely secondary to COPD exacerbation -Seems close to his baseline  3.  Atrial fibrillation CHADSVasc score of 3 -Rate controlled on calcium channel blocker therapy -Patient currently not on anticoagulation  4.  Hypertension -Blood pressures are stable, continue Cardizem and metoprolol 12.5 mg daily  5.  Suspected acute bacterial bronchitis versus atypical pneumonia -This possible underlying infectious process may have precipitated COPD exacerbation -Will transition to oral doxycycline    Code Status: Full code Family Communication: Family not present Disposition Plan: Anticipate discharge in the next 24 hours   Antibiotics: Doxycycline  HPI/Subjective: Patient is a 69 year old with a past medical history of chronic obstructive pulmonary disease, chronic hypoxemic respiratory failure on 2 L supplemental oxygen via nasal cannula at baseline, atrial fibrillation, admitted to the medicine service on 07/19/2014 when she presented with complaints of worsening shortness of breath associate with cough and wheezing. A chest x-ray performed on admission did not show acute cardiopulmonary disease. Symptoms not to be due to COPD exacerbation as she was started on systemic steroids, nebs and empiric antibiotic therapy.  Objective: Filed  Vitals:   07/21/14 0620  BP: 113/58  Pulse: 98  Temp: 97.9 F (36.6 C)  Resp: 18    Intake/Output Summary (Last 24 hours) at 07/21/14 1345 Last data filed at 07/21/14 0844  Gross per 24 hour  Intake    720 ml  Output    900 ml  Net   -180 ml   Filed Weights   07/19/14 2106 07/20/14 0103 07/21/14 0620  Weight: 80.74 kg (178 lb) 80.6 kg (177 lb 11.1 oz) 82.4 kg (181 lb 10.5 oz)    Exam:   General:  Patient is awake and alert, oriented in no acute distress states feeling little better this morning  Cardiovascular: Regular rate and rhythm normal S1-S2 without murmurs rubs or gallops extremity edema  Respiratory: On supplemental oxygen, seems improved from a respiratory standpoint, significant improvement to expiratory wheezing from yesterday's exam  Abdomen: Soft nontender nondistended  Musculoskeletal: No edema  Data Reviewed: Basic Metabolic Panel:  Recent Labs Lab 07/19/14 2056 07/20/14 0255 07/21/14 0515  NA 137 135 139  K 3.9 4.0 4.7  CL 101 98* 103  CO2 '26 26 26  '$ GLUCOSE 134* 303* 180*  BUN '7 10 14  '$ CREATININE 0.75 0.92 0.97  CALCIUM 8.7* 8.6* 9.2   Liver Function Tests:  Recent Labs Lab 07/19/14 2056 07/20/14 0255  AST 13* 14*  ALT 12* 13*  ALKPHOS 87 83  BILITOT 0.9 0.9  PROT 5.9* 5.5*  ALBUMIN 2.8* 2.5*   No results for input(s): LIPASE, AMYLASE in the last 168 hours. No results for input(s): AMMONIA in the last 168 hours. CBC:  Recent Labs Lab 07/19/14 2056 07/20/14 0255 07/21/14 0515  WBC 19.8* 17.5* 16.7*  NEUTROABS 16.9*  --   --   HGB 11.1* 10.7* 10.2*  HCT 35.0* 35.7* 31.8*  MCV 98.0 98.6 97.0  PLT  233 239 241   Cardiac Enzymes:  Recent Labs Lab 07/20/14 0255  TROPONINI <0.03   BNP (last 3 results)  Recent Labs  06/19/14 1032 06/19/14 1745 07/19/14 2056  BNP 418.5* 441.4* 287.6*    ProBNP (last 3 results) No results for input(s): PROBNP in the last 8760 hours.  CBG: No results for input(s): GLUCAP in the  last 168 hours.  No results found for this or any previous visit (from the past 240 hour(s)).   Studies: Dg Chest 2 View  07/19/2014   CLINICAL DATA:  Shortness of breath.  Chest congestion.  COPD.  EXAM: CHEST  2 VIEW  COMPARISON:  07/15/2014  FINDINGS: Heart size remains within normal limits. Diffuse chronic pulmonary interstitial prominence remains stable. No evidence of acute pulmonary infiltrate or pleural effusion. No definite mass or lymphadenopathy identified.  IMPRESSION: Stable chronic pulmonary interstitial prominence. No active lung disease.   Electronically Signed   By: Earle Gell M.D.   On: 07/19/2014 22:02    Scheduled Meds: . aspirin  650 mg Oral Daily  . budesonide-formoterol  2 puff Inhalation BID  . busPIRone  10 mg Oral TID  . diltiazem  420 mg Oral Daily  . doxycycline (VIBRAMYCIN) IV  100 mg Intravenous Q12H  . enoxaparin (LOVENOX) injection  40 mg Subcutaneous Daily  . FLUoxetine  60 mg Oral Daily  . furosemide  20 mg Oral BID  . guaiFENesin  600 mg Oral BID  . ipratropium-albuterol  3 mL Nebulization Q4H  . metoprolol succinate  12.5 mg Oral QPC lunch  . pantoprazole  40 mg Oral Daily  . predniSONE  50 mg Oral Q breakfast  . sodium chloride  3 mL Intravenous Q12H  . tiotropium  18 mcg Inhalation Daily   Continuous Infusions:   Principal Problem:   COPD exacerbation Active Problems:   Essential hypertension   GERD (gastroesophageal reflux disease)   Chronic diastolic CHF (congestive heart failure)   Atrial fibrillation   Chronic respiratory failure with hypoxia    Time spent: 25 minutes    Kelvin Cellar  [facility] Pager [phone]. If 7PM-7AM, please contact night-coverage at [email or web address], password [facility] 07/21/2014, 1:45 PM  LOS: 1 day

## 2014-07-22 DIAGNOSIS — I10 Essential (primary) hypertension: Secondary | ICD-10-CM | POA: Diagnosis not present

## 2014-07-22 DIAGNOSIS — J441 Chronic obstructive pulmonary disease with (acute) exacerbation: Secondary | ICD-10-CM | POA: Diagnosis not present

## 2014-07-22 DIAGNOSIS — I482 Chronic atrial fibrillation: Secondary | ICD-10-CM | POA: Diagnosis not present

## 2014-07-22 MED ORDER — DOXYCYCLINE HYCLATE 100 MG PO TABS: 100.0000 mg | ORAL_TABLET | Freq: Two times a day (BID) | ORAL | Status: DC

## 2014-07-22 MED ORDER — PREDNISONE 10 MG (21) PO TBPK: ORAL_TABLET | ORAL | Status: DC

## 2014-07-22 NOTE — Care Management Note (Signed)
Case Management Note  Patient Details  Name: Kelsey Garcia MRN: [id number] Date of Birth: 1946/01/18  Subjective/Objective:                   Shortness of breath Action/Plan: Discharge planning  Expected Discharge Date:  07/22/14               Expected Discharge Plan:  [place]  In-House Referral:     Discharge planning Services  CM Consult  Post Acute Care Choice:  Resumption of Svcs/PTA Provider Choice offered to:     DME Arranged:  Oxygen DME Agency:  [place] Arranged:  RN, PT, OT [facility] Agency:  [facility]  Status of Service:  Completed, signed off  Medicare Important Message Given:  Yes Date Medicare IM Given:  07/20/14 Medicare IM give by:  [name] Date Additional Medicare IM Given:    Additional Medicare Important Message give by:     If discussed at [place] of Stay Meetings, dates discussed:    Additional Comments: CM spoke with pt who needs a loaner tank of O2 from [employer] as she is being discharged and has no way to get one from home.  CM called [facility] DME rep, Jeneen Rinks to please deliver the tank to room so pt can discharge. CM also confirmed with pt; she is active w [facility] for HHPT/OT/RN.  CM text requested F2F and resumption order from [facility].  Once orders are placed, CM will notify [facility] of discharge. Dellie Catholic, RN 07/22/2014, 11:46 AM

## 2014-07-23 DIAGNOSIS — F3342 Major depressive disorder, recurrent, in full remission: Secondary | ICD-10-CM | POA: Insufficient documentation

## 2014-07-23 LAB — LEGIONELLA ANTIGEN, URINE

## 2014-07-23 NOTE — Progress Notes (Signed)
Occupational Therapy Evaluation Addendum:    08/12/14 1500  OT G-codes **NOT FOR INPATIENT CLASS**  Functional Limitation Self care  Self Care Current Status (U4591) CJ  Self Care Goal Status (L6859) [place]  Self Care Discharge Status [phone]) CI  Lucille Passy, OTR/L [phone]

## 2014-07-24 ENCOUNTER — Telehealth: Payer: Self-pay | Admitting: Internal Medicine

## 2014-07-24 NOTE — Telephone Encounter (Signed)
Patient's PT from [name] has called in to see if she can receive a verbal order for one  More visit and twice a week for two more threre after; please f/u with PT about approval [phone]

## 2014-07-25 NOTE — Discharge Summary (Signed)
Physician Discharge Summary  IVALENE Garcia EPP:[id number] DOB: 12/26/1945 DOA: 07/19/2014  PCP: No PCP Per Patient  Admit date: 07/19/2014 Discharge date: 07/25/2014  Time spent: 35 minutes  Recommendations for Outpatient Follow-up:  1. Please follow up on patient's respiratory status, was treated for COPD exacerbation during this hospitalization  Discharge Diagnoses:  Principal Problem:   COPD exacerbation Active Problems:   Essential hypertension   GERD (gastroesophageal reflux disease)   Chronic diastolic CHF (congestive heart failure)   Atrial fibrillation   Chronic respiratory failure with hypoxia   Discharge Condition: Stable  Diet recommendation: Heart Healthy  Filed Weights   07/20/14 0103 07/21/14 0620 07/22/14 0432  Weight: 80.6 kg (177 lb 11.1 oz) 82.4 kg (181 lb 10.5 oz) 81.4 kg (179 lb 7.3 oz)    History of present illness:  Kelsey Garcia is a 69 y.o. female with Past medical history of atrial fibrillation, hypertension, chronic diastolic dysfunction, COPD, chronic respiratory failure on 2 L of oxygen, mitral regurgitation, mood disorder. The patient presents with complaints of sudden onset of shortness of breath starting on 07/18/2014 night. She mentions that she was at her baseline has some chronic shortness of breath at her baseline. She uses 2 L of oxygen regularly 24 7. On the night of 07/18/2014 she suddenly woke up from her sleep with the complaint of sudden onset of shortness of breath with cough with brown green expectoration. She had mild fever without any chills. She mentions she is chest pain-free at present but did have some chest pressure during the day. She denies any dizziness or lightheadedness. She denies any nausea or vomiting. She does not have any abdominal tenderness nor diarrhea no burning urination. No leg swelling. She mentions she is compliant with all her medication. She recently finished a prednisone course 3 days ago.  Hospital Course:   Patient is a 69 year old with a past medical history of chronic obstructive pulmonary disease, chronic hypoxemic respiratory failure on 2 L supplemental oxygen via nasal cannula at baseline, atrial fibrillation, admitted to the medicine service on 07/19/2014 when she presented with complaints of worsening shortness of breath associate with cough and wheezing. A chest x-ray performed on admission did not show acute cardiopulmonary disease. Symptoms not to be due to COPD exacerbation as she was started on systemic steroids, nebs and empiric antibiotic therapy.  1. Chronic obstructive pulmonary disease exacerbation -Patient presenting with complaints of cough associated with shortness of breath and wheezing. Has a history of COPD and chronic hypoxemic respiratory failure. -Patient showing clinical improvement, she was discharged on steroid taper and doxycycline.   2. Acute on chronic hypoxemic respiratory failure -Evidenced by a respiratory rate of 24 with an increase in oxygen requirement at 5 L nasal cannula. -Likely secondary to COPD exacerbation -By day of discharge she did not have labored breathing, respiratory status much improved.   3. Atrial fibrillation CHADSVasc score of 3 -Rate controlled on calcium channel blocker therapy -Patient currently not on anticoagulation  4. Hypertension -Blood pressures are stable, continue Cardizem and metoprolol 12.5 mg daily  5. Suspected acute bacterial bronchitis versus atypical pneumonia -This possible underlying infectious process may have precipitated COPD exacerbation -Will transition to oral doxycycline    Discharge Exam: Filed Vitals:   07/22/14 0432  BP: 128/70  Pulse: 101  Temp: 98.7 F (37.1 C)  Resp: 18   General: Sitting up reading her newspaper, appears comfortable, no evidence of labored breathing.  Eyes: PERRL ENT: Oral Mucosa clear moist. Neck: no  JVD Cardiovascular: S1 and S2 Present, no Murmur, Peripheral Pulses  Present Respiratory: Bilateral Air entry equal and Decreased Faint basal Crackles, overall lung exam much improved.  Abdomen: Bowel Sound present, Soft and non tender Skin: no Rash Extremities: no Pedal edema, no calf tenderness Neurologic: Grossly no focal neuro deficit.   Discharge Instructions   Discharge Instructions    Call MD for:  difficulty breathing, headache or visual disturbances    Complete by:  As directed      Call MD for:  extreme fatigue    Complete by:  As directed      Call MD for:  hives    Complete by:  As directed      Call MD for:  persistant dizziness or light-headedness    Complete by:  As directed      Call MD for:  persistant nausea and vomiting    Complete by:  As directed      Call MD for:  redness, tenderness, or signs of infection (pain, swelling, redness, odor or green/yellow discharge around incision site)    Complete by:  As directed      Call MD for:  severe uncontrolled pain    Complete by:  As directed      Call MD for:  temperature >100.4    Complete by:  As directed      Call MD for:    Complete by:  As directed      Diet - low sodium heart healthy    Complete by:  As directed      Increase activity slowly    Complete by:  As directed           Discharge Medication List as of 07/22/2014 12:40 PM    START taking these medications   Details  doxycycline (VIBRA-TABS) 100 MG tablet Take 1 tablet (100 mg total) by mouth every 12 (twelve) hours., Starting 07/22/2014, Until Discontinued, Print    predniSONE (STERAPRED UNI-PAK 21 TAB) 10 MG (21) TBPK tablet Take 6-5-4-3-2-1 tablets by mouth daily till gone., Print      CONTINUE these medications which have NOT CHANGED   Details  albuterol (PROVENTIL HFA;VENTOLIN HFA) 108 (90 BASE) MCG/ACT inhaler Inhale 1-2 puffs into the lungs every 6 (six) hours as needed for wheezing., Starting 04/28/2014, Until Discontinued, Print    albuterol (PROVENTIL) (2.5 MG/3ML) 0.083% nebulizer solution Take 3  mLs (2.5 mg total) by nebulization every 6 (six) hours as needed for wheezing., Starting 07/08/2014, Until Discontinued, Print    budesonide-formoterol (SYMBICORT) 160-4.5 MCG/ACT inhaler Inhale 2 puffs into the lungs 2 (two) times daily., Starting 06/05/2013, Until Discontinued, Normal    busPIRone (BUSPAR) 10 MG tablet Take 10 mg by mouth 3 (three) times daily., Starting 04/22/2014, Until Discontinued, Historical Med    chlorpheniramine (CHLOR-TRIMETON) 4 MG tablet Take 1 tablet (4 mg total) by mouth every 8 (eight) hours as needed for allergies., Starting 11/13/2013, Until Discontinued, Normal    diazepam (VALIUM) 2 MG tablet Take 1 tablet (2 mg total) by mouth every 12 (twelve) hours as needed for anxiety., Starting 12/06/2012, Until Discontinued, Print    diltiazem (TIAZAC) 420 MG 24 hr capsule Take 1 capsule (420 mg total) by mouth daily., Starting 05/09/2014, Until Thu 08/08/15, Normal    FLUoxetine (PROZAC) 20 MG capsule TAKE THREE CAPSULES BY MOUTH ONCE DAILY, Normal    furosemide (LASIX) 20 MG tablet Take 20-40 mg by mouth 3 (three) times daily as needed for fluid (fluid). Take  2 tablets (40 mg) in the am and Take 1 tablet (20 mg) in the afternoon., Until Discontinued, Historical Med    guaiFENesin (MUCINEX) 600 MG 12 hr tablet Take 1 tablet (600 mg total) by mouth 2 (two) times daily., Starting 04/28/2014, Until Discontinued, Print    HYDROcodone-acetaminophen (NORCO) 7.5-325 MG per tablet Take 1-2 tablets by mouth every 6 (six) hours as needed for moderate pain., Starting 07/08/2014, Until Fri 08/02/15, Print    HYDROcodone-homatropine (HYCODAN) 5-1.5 MG/5ML syrup Take 5 mLs by mouth every 6 (six) hours as needed for cough., Starting 04/30/2014, Until Discontinued, Print    hydroxypropyl methylcellulose / hypromellose (ISOPTO TEARS / GONIOVISC) 2.5 % ophthalmic solution Place 1 drop into both eyes daily as needed for dry eyes (dry eyes). , Until Discontinued, Historical Med    metoprolol  succinate (TOPROL-XL) 25 MG 24 hr tablet Take 0.5 tablets (12.5 mg total) by mouth daily after lunch., Starting 07/02/2014, Until Discontinued, Normal    nystatin (MYCOSTATIN) 100000 UNIT/ML suspension Take 5 mLs (500,000 Units total) by mouth as needed., Starting 04/16/2014, Until Discontinued, Normal    pantoprazole (PROTONIX) 40 MG tablet TAKE ONE TABLET BY MOUTH TWICE DAILY, Normal    tiotropium (SPIRIVA) 18 MCG inhalation capsule Place 1 capsule (18 mcg total) into inhaler and inhale daily., Starting 07/05/2013, Until Discontinued, Normal      STOP taking these medications     aspirin 325 MG tablet        Allergies  Allergen Reactions  . Ace Inhibitors Cough  . Diphenhydramine Hcl Other (See Comments)    "feels like my skin is crawling, and legs twitches"  . Erythromycin Diarrhea  . Nsaids Nausea And Vomiting  . Nyquil [Pseudoeph-Doxylamine-Dm-Apap] Itching  . Tramadol Hcl Other (See Comments)    stomach pain, hallucinations  . Codeine Swelling    Face and lips swelling  Patient report Can tolerate hydrocodone  . Tomato Other (See Comments)    Stomach swells  . Orange Fruit [Citrus] Rash  . Pseudoephedrine Palpitations      The results of significant diagnostics from this hospitalization (including imaging, microbiology, ancillary and laboratory) are listed below for reference.    Significant Diagnostic Studies: Dg Chest 2 View  07/19/2014   CLINICAL DATA:  Shortness of breath.  Chest congestion.  COPD.  EXAM: CHEST  2 VIEW  COMPARISON:  07/15/2014  FINDINGS: Heart size remains within normal limits. Diffuse chronic pulmonary interstitial prominence remains stable. No evidence of acute pulmonary infiltrate or pleural effusion. No definite mass or lymphadenopathy identified.  IMPRESSION: Stable chronic pulmonary interstitial prominence. No active lung disease.   Electronically Signed   By: Earle Gell M.D.   On: 07/19/2014 22:02   Dg Chest 2 View  07/15/2014   CLINICAL DATA:   The patient fell today.  Recent pneumonia.  EXAM: CHEST  2 VIEW  COMPARISON:  Chest x-ray dated 07/30/2014 and chest CT dated 09/29/2014  FINDINGS: The heart size and pulmonary vascularity are normal. Tortuosity and calcification of the thoracic aorta. Small area of scarring in the lingula, stable. No infiltrates or effusions. No acute osseous abnormality.  IMPRESSION: No acute abnormalities.   Electronically Signed   By: Lorriane Shire M.D.   On: 07/15/2014 15:33   Dg Chest 2 View  07/05/2014   CLINICAL DATA:  Sore throat and eye swelling after recent medication adjustment. History of multiple prior medication allergies.  EXAM: CHEST  2 VIEW  COMPARISON:  06/19/2014; 05/19/2014; chest CT - 05/20/2014  FINDINGS:  Grossly unchanged borderline enlarged cardiac silhouette and mediastinal contours with mild tortuosity of the thoracic aorta. Overall improved aeration of the lungs with persistent minimal bibasilar opacities there is persistent mild elevation / eventration of the anterior aspect the right hemidiaphragm. No new focal airspace opacities. No pleural effusion or pneumothorax. No evidence of edema. No acute osseous abnormalities. Calcifications seen best on the provided lateral radiograph correlate with the cholelithiasis demonstrated on recently obtained chest CT.  IMPRESSION: 1. Improved aeration of the lungs without acute cardiopulmonary disease. 2. Cholelithiasis.   Electronically Signed   By: Sandi Mariscal M.D.   On: 07/05/2014 19:45   Dg Shoulder Right  07/15/2014   CLINICAL DATA:  Right knee gave out and patient fell and injured right shoulder today.  EXAM: RIGHT SHOULDER - 2+ VIEW; RIGHT KNEE - COMPLETE 4+ VIEW  COMPARISON:  None.  FINDINGS: Right shoulder:  The joint spaces are maintained. No acute fractures identified. The visualized right lung is clear.  Right knee:  Mild degenerative changes but no acute fracture or osteochondral abnormality. No definite joint effusion.  IMPRESSION: No acute bony  findings.   Electronically Signed   By: Marijo Sanes M.D.   On: 07/15/2014 14:03   Ct Head Wo Contrast  07/15/2014   CLINICAL DATA:  Right-sided neck pain secondary to a fall out of the shower last night.  EXAM: CT HEAD WITHOUT CONTRAST  CT CERVICAL SPINE WITHOUT CONTRAST  TECHNIQUE: Multidetector CT imaging of the head and cervical spine was performed following the standard protocol without intravenous contrast. Multiplanar CT image reconstructions of the cervical spine were also generated.  COMPARISON:  Brain MRI dated 06/22/2011  FINDINGS: CT HEAD FINDINGS  No mass lesion. No midline shift. No acute hemorrhage or hematoma. No extra-axial fluid collections. No evidence of acute infarction. Brain parenchyma is normal. Osseous structures are normal.  CT CERVICAL SPINE FINDINGS  There is no fracture or prevertebral soft tissue swelling.  3.4 mm spondylolisthesis of C3 on C4 due to severe right facet arthritis. Slight bulging of the uncovered disc.  Auto fusion of the right facet joint at C2-3. Severe degenerative disc disease at C4-5 and C5-6 and to a lesser degree at C6-7 and C7-T1. Minimal anterolisthesis of C7 on T1. Moderate left facet arthritis at C7-T1. There is also moderate left facet arthritis at C4-5.  IMPRESSION: 1. Normal CT scan of the head. 2. No acute abnormality of the cervical spine.   Electronically Signed   By: Lorriane Shire M.D.   On: 07/15/2014 16:10   Ct Chest Wo Contrast  07/07/2014   CLINICAL DATA:  Recent admission for respiratory failure due to influenza discharge on 06/22/2014. Recent progressive worsening of nausea with vomiting and diarrhea. Recently treated with Levaquin for bronchitis/COPD.  EXAM: CT CHEST WITHOUT CONTRAST  TECHNIQUE: Multidetector CT imaging of the chest was performed following the standard protocol without IV contrast.  COMPARISON:  05/20/2014, 05/13/2014 and 09/25/2010  FINDINGS: Lungs are adequately inflated with continued moderate interval improvement in  patient's previously noted patchy bilateral nodular airspace process with only very minimal residual faint reticulonodular opacification over the right upper lobe and minimally in the lower lobes. No evidence of effusion. Linear atelectasis over the lingula. Airways are within normal.  Heart size is normal. There is very minimal calcified plaque over the distal left main and proximal left anterior descending coronary arteries. There is a 1.1 cm precarinal lymph node without significant change likely reactive. Few other shotty subcentimeter mediastinal lymph nodes.  No significant hilar or axillary adenopathy. There is mild calcified plaque over the aortic arch. Small calcified subcarinal lymph node. Remaining mediastinal structures are within normal.  Images through the upper abdomen demonstrate moderate cholelithiasis. Remainder of the exam is unchanged.  IMPRESSION: Near complete resolution of patient's previously noted moderate bilateral nodular airspace process with only minimal residual disease over the right upper lobe and faintly in the lower lobes likely resolving infectious process. Minimal reactive mediastinal adenopathy unchanged.  Subtle atherosclerotic coronary artery disease.  Moderate cholelithiasis.   Electronically Signed   By: Marin Olp M.D.   On: 07/07/2014 11:18   Ct Cervical Spine Wo Contrast  07/15/2014   CLINICAL DATA:  Right-sided neck pain secondary to a fall out of the shower last night.  EXAM: CT HEAD WITHOUT CONTRAST  CT CERVICAL SPINE WITHOUT CONTRAST  TECHNIQUE: Multidetector CT imaging of the head and cervical spine was performed following the standard protocol without intravenous contrast. Multiplanar CT image reconstructions of the cervical spine were also generated.  COMPARISON:  Brain MRI dated 06/22/2011  FINDINGS: CT HEAD FINDINGS  No mass lesion. No midline shift. No acute hemorrhage or hematoma. No extra-axial fluid collections. No evidence of acute infarction. Brain  parenchyma is normal. Osseous structures are normal.  CT CERVICAL SPINE FINDINGS  There is no fracture or prevertebral soft tissue swelling.  3.4 mm spondylolisthesis of C3 on C4 due to severe right facet arthritis. Slight bulging of the uncovered disc.  Auto fusion of the right facet joint at C2-3. Severe degenerative disc disease at C4-5 and C5-6 and to a lesser degree at C6-7 and C7-T1. Minimal anterolisthesis of C7 on T1. Moderate left facet arthritis at C7-T1. There is also moderate left facet arthritis at C4-5.  IMPRESSION: 1. Normal CT scan of the head. 2. No acute abnormality of the cervical spine.   Electronically Signed   By: Lorriane Shire M.D.   On: 07/15/2014 16:10   Ct Abdomen Pelvis W Contrast  07/05/2014   CLINICAL DATA:  Nausea, vomiting and diarrhea since last night.  EXAM: CT ABDOMEN AND PELVIS WITH CONTRAST  TECHNIQUE: Multidetector CT imaging of the abdomen and pelvis was performed using the standard protocol following bolus administration of intravenous contrast.  CONTRAST:  134m OMNIPAQUE IOHEXOL 300 MG/ML  SOLN  COMPARISON:  Chest CTA dated 05/20/2014.  FINDINGS: Multiple gallstones in the gallbladder measuring up to 1.7 cm in maximum diameter each. The gallbladder is mildly dilated with no wall thickening or pericholecystic fluid. Mildly dilated proximal common duct an intrahepatic ducts. The distal common duct is normal in caliber and contains a few transversely oriented linear densities, best seen on coronal reconstructed image number 40.  Multiple sigmoid colon diverticula without evidence of diverticulitis. Mildly prominent stool in the colon. No gastric or small bowel abnormalities. No enlarged lymph nodes.  No liver masses. Note normal appearing spleen. Diffuse pancreatic atrophy with partial fatty replacement, most pronounced in the head. Unremarkable adrenal glands, kidneys and urinary bladder. The uterus is small and contains an 8 mm higher density mass posteriorly. The ovaries  are also small.  Atheromatous arterial calcifications, including the coronary arteries. The previously seen nodular densities at the lung bases are no longer present. The interstitial markings remain mildly prominent. Minimal bilateral dependent atelectasis. Lumbar and lower thoracic spine degenerative changes.  IMPRESSION: 1. Probable distal common duct strictures or noncalcified stones causing mild dilatation of the gallbladder and bile ducts. 2. Cholelithiasis. 3. Sigmoid diverticulosis without evidence of diverticulitis. 4.  8 mm probable uterine fibroid. 5. Mild atheromatous coronary artery calcifications. 6. Resolved nodular infection at the lung bases. 7. Mild residual interstitial lung disease.   Electronically Signed   By: Claudie Revering M.D.   On: 07/05/2014 21:50   Dg Knee Complete 4 Views Right  07/15/2014   CLINICAL DATA:  Right knee gave out and patient fell and injured right shoulder today.  EXAM: RIGHT SHOULDER - 2+ VIEW; RIGHT KNEE - COMPLETE 4+ VIEW  COMPARISON:  None.  FINDINGS: Right shoulder:  The joint spaces are maintained. No acute fractures identified. The visualized right lung is clear.  Right knee:  Mild degenerative changes but no acute fracture or osteochondral abnormality. No definite joint effusion.  IMPRESSION: No acute bony findings.   Electronically Signed   By: Marijo Sanes M.D.   On: 07/15/2014 14:03    Microbiology: No results found for this or any previous visit (from the past 240 hour(s)).   Labs: Basic Metabolic Panel:  Recent Labs Lab 07/19/14 2056 07/20/14 0255 07/21/14 0515  NA 137 135 139  K 3.9 4.0 4.7  CL 101 98* 103  CO2 '26 26 26  '$ GLUCOSE 134* 303* 180*  BUN '7 10 14  '$ CREATININE 0.75 0.92 0.97  CALCIUM 8.7* 8.6* 9.2   Liver Function Tests:  Recent Labs Lab 07/19/14 2056 07/20/14 0255  AST 13* 14*  ALT 12* 13*  ALKPHOS 87 83  BILITOT 0.9 0.9  PROT 5.9* 5.5*  ALBUMIN 2.8* 2.5*   No results for input(s): LIPASE, AMYLASE in the last 168  hours. No results for input(s): AMMONIA in the last 168 hours. CBC:  Recent Labs Lab 07/19/14 2056 07/20/14 0255 07/21/14 0515  WBC 19.8* 17.5* 16.7*  NEUTROABS 16.9*  --   --   HGB 11.1* 10.7* 10.2*  HCT 35.0* 35.7* 31.8*  MCV 98.0 98.6 97.0  PLT 233 239 241   Cardiac Enzymes:  Recent Labs Lab 07/20/14 0255  TROPONINI <0.03   BNP: BNP (last 3 results)  Recent Labs  06/19/14 1032 06/19/14 1745 07/19/14 2056  BNP 418.5* 441.4* 287.6*    ProBNP (last 3 results) No results for input(s): PROBNP in the last 8760 hours.  CBG: No results for input(s): GLUCAP in the last 168 hours.     SignedKelvin Cellar  [facility] 07/25/2014, 1:37 PM

## 2014-07-31 ENCOUNTER — Ambulatory Visit (INDEPENDENT_AMBULATORY_CARE_PROVIDER_SITE_OTHER): Payer: Medicare Other | Admitting: Pulmonary Disease

## 2014-07-31 ENCOUNTER — Encounter: Payer: Self-pay | Admitting: Pulmonary Disease

## 2014-07-31 VITALS — BP 118/68 | HR 72 | Ht 65.5 in | Wt 184.0 lb

## 2014-07-31 DIAGNOSIS — J449 Chronic obstructive pulmonary disease, unspecified: Secondary | ICD-10-CM

## 2014-07-31 DIAGNOSIS — J9611 Chronic respiratory failure with hypoxia: Secondary | ICD-10-CM | POA: Diagnosis not present

## 2014-07-31 NOTE — Progress Notes (Signed)
   Subjective:    Patient ID: Kelsey Garcia, female    DOB: 05/17/1945, 69 y.o.   MRN: [id number]  HPI  50/ F, smoker presents for FU of COPD.  She has a h/o opiate abuse in the 31's. She is not prescribed codeine containing medications due to her addiction hisotry.   Atrovent worked great, spiriva did not work as well. Advair helps in th emorning but not in the afternoon. It caused thrush.    07/31/2014  Chief Complaint  Patient presents with  . Follow-up    Pt states that her breathing is not back to baseline.    Not back to baseline after hosp She has chronic hypoxic resp failure on 2l/m continuous .  Has portable concentrator -[employer] -[employer].  She denies any hemoptysis, chest pain, orthopnea, PND, or increased leg swelling  She has diastolic congestive heart failure and is on Lasix daily. Has not smoked since discharge 06/2014 She had a fallout with her PCP-and is trying to change to [facility]- I suspect that this was related to her pain medication prescriptions  Significant tests/ events   03/15/2013- Returns for follow up after 5 years She was quite frank with me and admitted that the only reason she was here was so that she could get a prescription for codeine cough syrup which her PCP had not given her.  PFT's 10/2012 show FEV1/FVC = 71% predicted, FEV1 = 49% predicted, DLCO 39% predicted  Admitted 05/2014  for an acute on chronic hypoxic respiratory failure with COPD exacerbation, Influenza  and PNA.   CT 06/2014 no ILD, resolved nodular ASD CXR 07/2014 int prominence  Review of Systems neg for any significant sore throat, dysphagia, itching, sneezing, nasal congestion or excess/ purulent secretions, fever, chills, sweats, unintended wt loss, pleuritic or exertional cp, hempoptysis, orthopnea pnd or change in chronic leg swelling. Also denies presyncope, palpitations, heartburn, abdominal pain, nausea, vomiting, diarrhea or change in bowel or urinary habits,  dysuria,hematuria, rash, arthralgias, visual complaints, headache, numbness weakness or ataxia.     Objective:   Physical Exam  Gen.well-nourished, in no distress, normal affect ENT - no lesions, no post nasal drip Neck: No JVD, no thyromegaly, no carotid bruits Lungs: no use of accessory muscles, no dullness to percussion, clear without rales or rhonchi  Cardiovascular: Rhythm regular, heart sounds  normal, no murmurs or gallops, no peripheral edema Abdomen: soft and non-tender, no hepatosplenomegaly, BS normal. Musculoskeletal: No deformities, no cyanosis or clubbing Neuro:  alert, non focal       Assessment & Plan:

## 2014-07-31 NOTE — Patient Instructions (Signed)
Stay on [place] Call us as needed

## 2014-08-01 NOTE — Assessment & Plan Note (Signed)
Continue Symbicort and Spiriva We will not refill any prescriptions for codeine or narcotics for her

## 2014-08-01 NOTE — Assessment & Plan Note (Signed)
Continue 2 liters of oxygen at all times

## 2014-08-07 ENCOUNTER — Other Ambulatory Visit: Payer: Self-pay | Admitting: Pulmonary Disease

## 2014-08-07 DIAGNOSIS — J449 Chronic obstructive pulmonary disease, unspecified: Secondary | ICD-10-CM

## 2014-08-07 MED ORDER — TIOTROPIUM BROMIDE MONOHYDRATE 18 MCG IN CAPS: 18.0000 ug | ORAL_CAPSULE | Freq: Every day | RESPIRATORY_TRACT | Status: DC

## 2014-08-07 MED ORDER — BUDESONIDE-FORMOTEROL FUMARATE 160-4.5 MCG/ACT IN AERO: 2.0000 | INHALATION_SPRAY | Freq: Two times a day (BID) | RESPIRATORY_TRACT | Status: DC

## 2014-08-07 NOTE — Progress Notes (Signed)
Patient ID: Kelsey Garcia, female   DOB: 10-Apr-1945, 69 y.o.   MRN: [id number]  [name] living [place]    Allergies  Allergen Reactions  . Ace Inhibitors Cough  . Diphenhydramine Hcl Other (See Comments)    "feels like my skin is crawling, and legs twitches"  . Erythromycin Diarrhea  . Nsaids Nausea And Vomiting  . Nyquil [Pseudoeph-Doxylamine-Dm-Apap] Itching  . Tramadol Hcl Other (See Comments)    stomach pain, hallucinations  . Codeine Swelling    Face and lips swelling  Patient report Can tolerate hydrocodone  . Tomato Other (See Comments)    Stomach swells  . Orange Fruit [Citrus] Rash  . Pseudoephedrine Palpitations       Chief Complaint  Patient presents with  . Discharge Note    HPI:  She is being discharged to home with home health for pt/ot/rn. She does not need dme. She will need her prescriptions to be written and will need follow up with her pcp. She is excited about going home.    Past Medical History  Diagnosis Date  . Atrial fibrillation   . Hypertension   . Insomnia   . Nonischemic cardiomyopathy   . Hyperlipidemia   . Anxiety   . Cholelithiasis   . Insomnia   . Mitral regurgitation     noted 2010  . H/O epistaxis   . Rhinitis, allergic   . CHF (congestive heart failure)   . Pneumonia     "several times w/exacerbations of the COPD; nothing in the last year" (07/15/2012)  . COPD (chronic obstructive pulmonary disease)     as of 7/13 on 2-3L, pfts 10/2008 with mod obstruction  . Chronic bronchitis with COPD (chronic obstructive pulmonary disease)   . Shortness of breath     "all the time right now" (07/15/2012)  . GERD (gastroesophageal reflux disease)   . ZWCHENID(782.4)     "weekly" (07/15/2012)  . Migraines     "weekly for awhile; cleared up as I got older" (07/15/2012)  . DJD (degenerative joint disease)   . Arthritis     "all over" (07/15/2012)  . OCD (obsessive compulsive disorder)   . OCD (obsessive compulsive disorder)   . Depression       h/o SI; "last time I was really serious about it was ~ 1997" (07/15/2012)    Past Surgical History  Procedure Laterality Date  . Tubal ligation  1972  . Cardioversion  2003; 07/2003    VITAL SIGNS BP 138/90 mmHg  Pulse 118  Ht '5\' 5"'$  (1.651 m)  Wt 188 lb (85.276 kg)  BMI 31.28 kg/m2  SpO2 94%   Outpatient Encounter Prescriptions as of 06/08/2014  Medication Sig   ALBUTEROL (PROVENTIL HFA;VENTOLIN HFA) 108 (90 BASE) MCG/ACT INHALER    Inhale 1-2 puffs into the lungs every 6 (six) hours as needed for wheezing.  ASPIRIN 325 MG TABLET    Take 650 mg by mouth daily.   BUDESONIDE-FORMOTEROL (SYMBICORT) 160-4.5 MCG/ACT INHALER    Inhale 2 puffs into the lungs 2 (two) times daily.  BUSPIRONE (BUSPAR) 10 MG TABLET    Take 10 mg by mouth 3 (three) times daily.  CHLORPHENIRAMINE (CHLOR-TRIMETON) 4 MG TABLET    Take 1 tablet (4 mg total) by mouth every 8 (eight) hours as needed for allergies.  DIAZEPAM (VALIUM) 2 MG TABLET    Take 1 tablet (2 mg total) by mouth every 12 (twelve) hours as needed for anxiety.  DILTIAZEM (TIAZAC) 420 MG 24 HR CAPSULE  Take 1 capsule (420 mg total) by mouth daily.  FLUOXETINE (PROZAC) 20 MG CAPSULE    TAKE THREE CAPSULES BY MOUTH ONCE DAILY  GUAIFENESIN (MUCINEX) 600 MG 12 HR TABLET    Take 1 tablet (600 mg total) by mouth 2 (two) times daily.  HYDROCODONE-ACETAMINOPHEN (NORCO) 7.5-325 MG PER TABLET    Take 1 tablet by mouth every 6 (six) hours as needed.  HYDROCODONE-HOMATROPINE (HYCODAN) 5-1.5 MG/5ML SYRUP    Take 5 mLs by mouth every 6 (six) hours as needed for cough.  NYSTATIN (MYCOSTATIN) 100000 UNIT/ML SUSPENSION    Take 5 mLs (500,000 Units total) by mouth as needed.  PANTOPRAZOLE (PROTONIX) 40 MG TABLET    TAKE ONE TABLET BY MOUTH TWICE DAILY  TIOTROPIUM (SPIRIVA) 18 MCG INHALATION CAPSULE    Place 1 capsule (18 mcg total) into inhaler and inhale daily.  ZOLPIDEM (AMBIEN) 5 MG TABLET    Take 1 tablet (5 mg total) by mouth at bedtime as needed for sleep.       SIGNIFICANT DIAGNOSTIC EXAMS    Review of Systems  Constitutional: Negative for malaise/fatigue.  Respiratory: Negative for cough and shortness of breath.   Cardiovascular: Negative for chest pain, palpitations and leg swelling.  Gastrointestinal: Negative for heartburn, abdominal pain and constipation.  Musculoskeletal: Negative for myalgias and joint pain.  Skin: Negative.   Neurological: Negative for headaches.  Psychiatric/Behavioral: The patient is not nervous/anxious.      Physical Exam  Constitutional: She is oriented to person, place, and time. She appears well-developed and well-nourished. No distress.  Neck: Neck supple. No JVD present.  Cardiovascular: Normal rate, regular rhythm and intact distal pulses.   Respiratory: Effort normal and breath sounds normal. No respiratory distress.  GI: Soft. Bowel sounds are normal. She exhibits no distension. There is no tenderness.  Musculoskeletal: Normal range of motion. She exhibits no edema.  Neurological: She is alert and oriented to person, place, and time.  Skin: Skin is warm and dry. She is not diaphoretic.     ASSESSMENT/ PLAN:  Will discharge her to home with home health for pt/ot/rn to evaluate and treat as indicated to improve her endurance; adl retraining and to enroll in health lung program. She will not need dme. Her prescriptions have been written for a 30 day supply of her medications with #30 valuim 2 mg tbs; vicodin 7.5/325 mg #30 tabs; and hydrocodone-homatropine 5-1.'5mg'$ /15 cc disp 120 cc; #30 ambien 5 mg tabs. She has a follow up scheduled with her pcp that he facility is setting up.   Time spent with patient 40 minutes.    Ok Edwards NP [facility]  Contact [phone] Monday through Friday 8am- 5pm  After hours call [phone]

## 2014-08-07 NOTE — Telephone Encounter (Signed)
Refilled Spiriva and Symbicort. Nothing further needed.

## 2014-08-29 ENCOUNTER — Emergency Department (HOSPITAL_COMMUNITY)
Admission: EM | Admit: 2014-08-29 | Discharge: 2014-08-29 | Disposition: A | Discharge status: Home / Self Care | Payer: Medicare Other | Attending: Emergency Medicine | Admitting: Emergency Medicine

## 2014-08-29 ENCOUNTER — Encounter (HOSPITAL_COMMUNITY): Payer: Self-pay | Admitting: Emergency Medicine

## 2014-08-29 ENCOUNTER — Emergency Department (HOSPITAL_COMMUNITY): Payer: Medicare Other

## 2014-08-29 DIAGNOSIS — I4891 Unspecified atrial fibrillation: Secondary | ICD-10-CM | POA: Diagnosis not present

## 2014-08-29 DIAGNOSIS — Z79899 Other long term (current) drug therapy: Secondary | ICD-10-CM | POA: Diagnosis not present

## 2014-08-29 DIAGNOSIS — Z87891 Personal history of nicotine dependence: Secondary | ICD-10-CM | POA: Insufficient documentation

## 2014-08-29 DIAGNOSIS — Z7951 Long term (current) use of inhaled steroids: Secondary | ICD-10-CM | POA: Insufficient documentation

## 2014-08-29 DIAGNOSIS — K219 Gastro-esophageal reflux disease without esophagitis: Secondary | ICD-10-CM | POA: Insufficient documentation

## 2014-08-29 DIAGNOSIS — M25511 Pain in right shoulder: Secondary | ICD-10-CM | POA: Diagnosis not present

## 2014-08-29 DIAGNOSIS — R3915 Urgency of urination: Secondary | ICD-10-CM | POA: Insufficient documentation

## 2014-08-29 DIAGNOSIS — F419 Anxiety disorder, unspecified: Secondary | ICD-10-CM | POA: Insufficient documentation

## 2014-08-29 DIAGNOSIS — Z8701 Personal history of pneumonia (recurrent): Secondary | ICD-10-CM | POA: Diagnosis not present

## 2014-08-29 DIAGNOSIS — G43909 Migraine, unspecified, not intractable, without status migrainosus: Secondary | ICD-10-CM | POA: Insufficient documentation

## 2014-08-29 DIAGNOSIS — R350 Frequency of micturition: Secondary | ICD-10-CM | POA: Insufficient documentation

## 2014-08-29 DIAGNOSIS — Z8639 Personal history of other endocrine, nutritional and metabolic disease: Secondary | ICD-10-CM | POA: Diagnosis not present

## 2014-08-29 DIAGNOSIS — I509 Heart failure, unspecified: Secondary | ICD-10-CM | POA: Insufficient documentation

## 2014-08-29 DIAGNOSIS — F329 Major depressive disorder, single episode, unspecified: Secondary | ICD-10-CM | POA: Diagnosis not present

## 2014-08-29 DIAGNOSIS — R0602 Shortness of breath: Secondary | ICD-10-CM | POA: Diagnosis present

## 2014-08-29 DIAGNOSIS — M1990 Unspecified osteoarthritis, unspecified site: Secondary | ICD-10-CM | POA: Diagnosis not present

## 2014-08-29 DIAGNOSIS — G8929 Other chronic pain: Secondary | ICD-10-CM | POA: Diagnosis not present

## 2014-08-29 DIAGNOSIS — J441 Chronic obstructive pulmonary disease with (acute) exacerbation: Secondary | ICD-10-CM | POA: Diagnosis not present

## 2014-08-29 DIAGNOSIS — I10 Essential (primary) hypertension: Secondary | ICD-10-CM | POA: Diagnosis not present

## 2014-08-29 LAB — BASIC METABOLIC PANEL
ANION GAP: 10 (ref 5–15)
BUN: 9 mg/dL (ref 6–20)
CALCIUM: 8.7 mg/dL — AB (ref 8.9–10.3)
CO2: 33 mmol/L — ABNORMAL HIGH (ref 22–32)
CREATININE: 0.68 mg/dL (ref 0.44–1.00)
Chloride: 97 mmol/L — ABNORMAL LOW (ref 101–111)
GFR calc Af Amer: >60 mL/min (ref >60)
GFR calc non Af Amer: >60 mL/min (ref >60)
GLUCOSE: 130 mg/dL — AB (ref 65–99)
POTASSIUM: 3.7 mmol/L (ref 3.5–5.1)
Sodium: 140 mmol/L (ref 135–145)

## 2014-08-29 LAB — CBC WITH DIFFERENTIAL/PLATELET
BASOS ABS: 0 10*3/uL (ref 0.0–0.1)
BASOS PCT: 0 % (ref 0–1)
Eosinophils Absolute: 0.1 10*3/uL (ref 0.0–0.7)
Eosinophils Relative: 1 % (ref 0–5)
HCT: 36.7 % (ref 36.0–46.0)
HEMOGLOBIN: 11.8 g/dL — AB (ref 12.0–15.0)
Lymphocytes Relative: 21 % (ref 12–46)
Lymphs Abs: 1.7 10*3/uL (ref 0.7–4.0)
MCH: 31.4 pg (ref 26.0–34.0)
MCHC: 32.2 g/dL (ref 30.0–36.0)
MCV: 97.6 fL (ref 78.0–100.0)
MONOS PCT: 10 % (ref 3–12)
Monocytes Absolute: 0.8 10*3/uL (ref 0.1–1.0)
NEUTROS ABS: 5.5 10*3/uL (ref 1.7–7.7)
Neutrophils Relative %: 68 % (ref 43–77)
PLATELETS: 237 10*3/uL (ref 150–400)
RBC: 3.76 MIL/uL — ABNORMAL LOW (ref 3.87–5.11)
RDW: 15 % (ref 11.5–15.5)
WBC: 8.2 10*3/uL (ref 4.0–10.5)

## 2014-08-29 LAB — URINE MICROSCOPIC-ADD ON

## 2014-08-29 LAB — BRAIN NATRIURETIC PEPTIDE: B Natriuretic Peptide: 114.9 pg/mL — ABNORMAL HIGH (ref 0.0–100.0)

## 2014-08-29 LAB — URINALYSIS, ROUTINE W REFLEX MICROSCOPIC
BILIRUBIN URINE: NEGATIVE
GLUCOSE, UA: NEGATIVE mg/dL
HGB URINE DIPSTICK: NEGATIVE
Ketones, ur: NEGATIVE mg/dL
Nitrite: NEGATIVE
PH: 6 (ref 5.0–8.0)
Protein, ur: NEGATIVE mg/dL
SPECIFIC GRAVITY, URINE: 1.01 (ref 1.005–1.030)
UROBILINOGEN UA: 0.2 mg/dL (ref 0.0–1.0)

## 2014-08-29 LAB — TROPONIN I

## 2014-08-29 MED ORDER — ALBUTEROL (5 MG/ML) CONTINUOUS INHALATION SOLN
10.0000 mg/h | INHALATION_SOLUTION | RESPIRATORY_TRACT | Status: DC
  Administered 2014-08-29: 10 mg/h via RESPIRATORY_TRACT
  Filled 2014-08-29: qty 20

## 2014-08-29 MED ORDER — OXYCODONE-ACETAMINOPHEN 5-325 MG PO TABS
1.0000 | ORAL_TABLET | Freq: Once | ORAL | Status: AC
  Administered 2014-08-29: 1 via ORAL
  Filled 2014-08-29: qty 1

## 2014-08-29 MED ORDER — PREDNISONE 20 MG PO TABS: 40.0000 mg | ORAL_TABLET | Freq: Every day | ORAL | Status: DC

## 2014-08-29 MED ORDER — AMOXICILLIN-POT CLAVULANATE 875-125 MG PO TABS: 1.0000 | ORAL_TABLET | Freq: Two times a day (BID) | ORAL | Status: DC

## 2014-08-29 NOTE — ED Notes (Signed)
Pt back from xray. Returned on monitor. NAD

## 2014-08-29 NOTE — ED Notes (Signed)
Pt denies chest pain with shortness of breath. Denies nausea/vomiting.

## 2014-08-29 NOTE — ED Notes (Signed)
While ambulating in hall pt O2 was 95% and dropped to 91%. Pt felt a little short of breath and weak in the knees.

## 2014-08-29 NOTE — ED Provider Notes (Signed)
CSN: [id number]     Arrival date & time 08/29/14  0920 History   First MD Initiated Contact with Patient 08/29/14 0920     Chief Complaint  Patient presents with  . Shortness of Breath   Kelsey Garcia is a 69 y.o. female with a history of COPD, a-fib, HTN, Nonischemic cardiomyopathy, and CHF who presents to the ED complaining of increasing wheezing, cough and shortness of breath for the past 3 days. The patient also reports subjective fever last night. She reports having three neb treatments today since 5 am. She had her last one by EMS with 5 mg albuterol and atrovent. She also got 125 of solumedrol by EMS. She reports this feels like her most recent COPD exacerbation. She reports a productive cough with yellow sputum. Patient is on 2 L of oxygen at home continuously. Patient is followed by [name] from [facility] pulmonology. Patient is complaining of 10 out of 10 right shoulder arthritis pain. This is chronic for her. The patient denies chest pain, palpitations, abdominal pain, nausea, vomiting, rashes, urinary symptoms, leg pain, leg swelling, or lightheadedness.   (Consider location/radiation/quality/duration/timing/severity/associated sxs/prior Treatment) HPI  Past Medical History  Diagnosis Date  . Atrial fibrillation   . Hypertension   . Insomnia   . Nonischemic cardiomyopathy   . Hyperlipidemia   . Anxiety   . Cholelithiasis   . Insomnia   . Mitral regurgitation     noted 2010  . H/O epistaxis   . Rhinitis, allergic   . CHF (congestive heart failure)   . Pneumonia     "several times w/exacerbations of the COPD; nothing in the last year" (07/15/2012)  . COPD (chronic obstructive pulmonary disease)     as of 7/13 on 2-3L, pfts 10/2008 with mod obstruction  . Chronic bronchitis with COPD (chronic obstructive pulmonary disease)   . Shortness of breath     "all the time right now" (07/15/2012)  . GERD (gastroesophageal reflux disease)   . PPIRJJOA(416.6)     "weekly" (07/15/2012)  .  Migraines     "weekly for awhile; cleared up as I got older" (07/15/2012)  . DJD (degenerative joint disease)   . Arthritis     "all over" (07/15/2012)  . OCD (obsessive compulsive disorder)   . OCD (obsessive compulsive disorder)   . Depression     h/o SI; "last time I was really serious about it was ~ 1997" (07/15/2012)   Past Surgical History  Procedure Laterality Date  . Tubal ligation  1972  . Cardioversion  2003; 07/2003   Family History  Problem Relation Age of Onset  . Asthma    . Emphysema    . Allergies    . Cancer      aunt had several types of cancer  . COPD Mother   . Emphysema Mother   . Cirrhosis Father    History  Substance Use Topics  . Smoking status: Former Smoker -- 0.10 packs/day for 35 years    Types: Cigarettes    Quit date: 06/18/2014  . Smokeless tobacco: Never Used     Comment: No desire to quit at this time./ 1PPD  . Alcohol Use: No   OB History    No data available     Review of Systems  Constitutional: Positive for fever and chills. Negative for appetite change.  HENT: Negative for congestion and sore throat.   Eyes: Negative for visual disturbance.  Respiratory: Positive for cough, chest tightness, shortness of breath  and wheezing.   Cardiovascular: Negative for chest pain, palpitations and leg swelling.  Gastrointestinal: Negative for nausea, vomiting, abdominal pain and diarrhea.  Genitourinary: Positive for urgency (chronic) and frequency (chronic ). Negative for dysuria and difficulty urinating.  Musculoskeletal: Positive for arthralgias (chronic right shoulder pain). Negative for back pain and neck pain.  Skin: Negative for rash.  Neurological: Negative for light-headedness and headaches.      Allergies  Ace inhibitors; Diphenhydramine hcl; Erythromycin; Nsaids; Nyquil; Tramadol hcl; Codeine; Tomato; Orange fruit; and Pseudoephedrine  Home Medications   Prior to Admission medications   Medication Sig Start Date End Date Taking?  Authorizing Provider  albuterol (PROVENTIL HFA;VENTOLIN HFA) 108 (90 BASE) MCG/ACT inhaler Inhale 1-2 puffs into the lungs every 6 (six) hours as needed for wheezing. 04/28/14  Yes Tanna Furry, MD  albuterol (PROVENTIL) (2.5 MG/3ML) 0.083% nebulizer solution Take 3 mLs (2.5 mg total) by nebulization every 6 (six) hours as needed for wheezing. 07/08/14  Yes Theodis Blaze, MD  budesonide-formoterol [facility]) 160-4.5 MCG/ACT inhaler Inhale 2 puffs into the lungs 2 (two) times daily. 08/07/14  Yes Rigoberto Noel, MD  busPIRone (BUSPAR) 10 MG tablet Take 10 mg by mouth 3 (three) times daily. 04/22/14  Yes Historical Provider, MD  chlorpheniramine (CHLOR-TRIMETON) 4 MG tablet Take 1 tablet (4 mg total) by mouth every 8 (eight) hours as needed for allergies. 11/13/13  Yes Tasrif Ahmed, MD  diltiazem (TIAZAC) 420 MG 24 hr capsule Take 1 capsule (420 mg total) by mouth daily. 05/09/14 08/08/15 Yes Bartholomew Crews, MD  FLUoxetine (PROZAC) 20 MG capsule TAKE THREE CAPSULES BY MOUTH ONCE DAILY Patient taking differently: Take 60 mg by mouth. TAKE THREE CAPSULES BY MOUTH ONCE DAILY 01/03/14  Yes Tasrif Ahmed, MD  furosemide (LASIX) 20 MG tablet Take 20-40 mg by mouth 3 (three) times daily as needed for fluid (fluid). Take 2 tablets (40 mg) in the am and Take 1 tablet (20 mg) in the afternoon.   Yes Historical Provider, MD  guaiFENesin (MUCINEX) 600 MG 12 hr tablet Take 1 tablet (600 mg total) by mouth 2 (two) times daily. Patient taking differently: Take 600 mg by mouth 2 (two) times daily as needed for cough.  04/28/14  Yes Tanna Furry, MD  hydroxypropyl methylcellulose / hypromellose (ISOPTO TEARS / GONIOVISC) 2.5 % ophthalmic solution Place 1 drop into both eyes daily as needed for dry eyes (dry eyes).    Yes Historical Provider, MD  metoprolol succinate (TOPROL-XL) 25 MG 24 hr tablet Take 0.5 tablets (12.5 mg total) by mouth daily after lunch. 07/02/14  Yes Arnoldo Morale, MD  nystatin (MYCOSTATIN) 100000 UNIT/ML  suspension Take 5 mLs (500,000 Units total) by mouth as needed. Patient taking differently: Take 500,000 Units by mouth daily as needed.  04/16/14  Yes Tasrif Ahmed, MD  oxyCODONE-acetaminophen (PERCOCET/ROXICET) 5-325 MG per tablet Take 1 tablet by mouth every 12 (twelve) hours as needed for severe pain.   Yes Historical Provider, MD  pantoprazole (PROTONIX) 40 MG tablet TAKE ONE TABLET BY MOUTH TWICE DAILY 01/03/14  Yes Tasrif Ahmed, MD  tiotropium (SPIRIVA) 18 MCG inhalation capsule Place 1 capsule (18 mcg total) into inhaler and inhale daily. 08/07/14  Yes Rigoberto Noel, MD  amoxicillin-clavulanate (AUGMENTIN) 875-125 MG per tablet Take 1 tablet by mouth every 12 (twelve) hours. 08/29/14   Waynetta Pean, PA-C  predniSONE (DELTASONE) 20 MG tablet Take 2 tablets (40 mg total) by mouth daily. 08/29/14   Waynetta Pean, PA-C   BP 108/42  mmHg  Pulse 97  Temp(Src) 98.8 F (37.1 C) (Oral)  Resp 21  Ht '5\' 5"'$  (1.651 m)  Wt 176 lb 11.2 oz (80.151 kg)  BMI 29.40 kg/m2  SpO2 96% Physical Exam  Constitutional: She is oriented to person, place, and time. She appears well-developed and well-nourished. No distress.  Nontoxic appearing.  HENT:  Head: Normocephalic and atraumatic.  Mouth/Throat: Oropharynx is clear and moist. No oropharyngeal exudate.  Mucous membranes appear slightly dry.  Eyes: Conjunctivae are normal. Pupils are equal, round, and reactive to light. Right eye exhibits no discharge. Left eye exhibits no discharge.  Neck: Neck supple. No JVD present. No tracheal deviation present.  Cardiovascular: Normal rate and intact distal pulses.  Exam reveals no gallop and no friction rub.   Patient an irregularly irregular rhythm. Heart rate is 96. Atrial fibrillation on the monitor.  Pulmonary/Chest: Effort normal. No respiratory distress. She has wheezes.  Scattered wheezes bilaterally. Diminished lung sounds in her bilateral bases. Patient speaking in full sentences.  Abdominal: Soft. She  exhibits no distension. There is no tenderness. There is no guarding.  Musculoskeletal: She exhibits no edema or tenderness.  No lower extremity edema or tenderness.  Lymphadenopathy:    She has no cervical adenopathy.  Neurological: She is alert and oriented to person, place, and time. Coordination normal.  Skin: Skin is warm and dry. No rash noted. She is not diaphoretic. No erythema. No pallor.  Psychiatric: She has a normal mood and affect. Her behavior is normal.  Nursing note and vitals reviewed.   ED Course  Procedures (including critical care time) Labs Review Labs Reviewed  BASIC METABOLIC PANEL - Abnormal; Notable for the following:    Chloride 97 (*)    CO2 33 (*)    Glucose, Bld 130 (*)    Calcium 8.7 (*)    All other components within normal limits  CBC WITH DIFFERENTIAL/PLATELET - Abnormal; Notable for the following:    RBC 3.76 (*)    Hemoglobin 11.8 (*)    All other components within normal limits  BRAIN NATRIURETIC PEPTIDE - Abnormal; Notable for the following:    B Natriuretic Peptide 114.9 (*)    All other components within normal limits  URINALYSIS, ROUTINE W REFLEX MICROSCOPIC (NOT AT [facility]) - Abnormal; Notable for the following:    Leukocytes, UA SMALL (*)    All other components within normal limits  TROPONIN I  URINE MICROSCOPIC-ADD ON    Imaging Review Dg Chest 2 View  08/29/2014   CLINICAL DATA:  Three-day history of shortness of breath with cough and wheezing  EXAM: CHEST  2 VIEW  COMPARISON:  July 19, 2014  FINDINGS: There is no edema or consolidation. The heart size and pulmonary vascularity are normal. No adenopathy. There is lower thoracic levoscoliosis. Bones are osteoporotic.  IMPRESSION: No edema or consolidation.   Electronically Signed   By: Lowella Grip III M.D.   On: 08/29/2014 10:19     EKG Interpretation   Date/Time:  Wednesday August 29 2014 09:31:07 EDT Ventricular Rate:  106 PR Interval:    QRS Duration: 81 QT Interval:   362 QTC Calculation: 481 R Axis:   -67 Text Interpretation:  Atrial fibrillation Left anterior fascicular block  Anteroseptal infarct, age indeterminate Baseline wander in lead(s) V1  artifact noted No significant change since last tracing Confirmed by  Christy Gentles  MD, DONALD ([id number]) on 08/29/2014 9:57:45 AM      Filed Vitals:   08/29/14 1200 08/29/14 1230  08/29/14 1300 08/29/14 1337  BP: 96/56 109/60 108/42 108/42  Pulse: 93  95 97  Temp:      TempSrc:      Resp: '21 22 19 21  '$ Height:      Weight:      SpO2: 97% 94% 91% 96%     MDM   Meds given in ED:  Medications  albuterol (PROVENTIL,VENTOLIN) solution continuous neb (0 mg/hr Nebulization Stopped 08/29/14 1207)  oxyCODONE-acetaminophen (PERCOCET/ROXICET) 5-325 MG per tablet 1 tablet (1 tablet Oral Given 08/29/14 1047)    New Prescriptions   AMOXICILLIN-CLAVULANATE (AUGMENTIN) 875-125 MG PER TABLET    Take 1 tablet by mouth every 12 (twelve) hours.   PREDNISONE (DELTASONE) 20 MG TABLET    Take 2 tablets (40 mg total) by mouth daily.    Final diagnoses:  COPD exacerbation   This is a 69 y.o. female with a history of COPD, a-fib, HTN, Nonischemic cardiomyopathy, and CHF who presents to the ED complaining of increasing wheezing, cough and shortness of breath for the past 3 days. The patient also reports subjective fever last night. She reports having three neb treatments today since 5 am. She had her last one by EMS with 5 mg albuterol and atrovent. She also got 125 of solumedrol by EMS. She reports this feels like her most recent COPD exacerbation. The patient is on 2 L of oxygen via nasal cannula continuously at home. The patient is followed by [name] from [facility] pulmonology.  On exam patient is afebrile nontoxic appearing. The patient has diminished lung sounds bilaterally with scattered wheezes. Patient given continuous nebulization treatment over one hour. Patient's BMP is unremarkable. Her hemoglobin has improved from her last  visit. Her hematomas 11.8 here today. She has no psychosis. She has negative troponin. BNP shows improvement from her last visit. Chest x-ray shows no edema or consolidation. At reevaluation after continuous nebulization the patient reports feeling improved. Patient ambulated in the hallway on her oxygen without hypoxia. She reports to me that she feels ready to be discharged. She reports that she is hungry and would like to go home and take a nap. Patient provided food in the ED which she tolerated well. She reports having plenty of albuterol at home. We'll discharge with prescriptions for Augmentin and a steroid burst. Patient already received 125 of Solu-Medrol by EMS. I advised patient to follow-up with her pulmonologist this week. Strict return precautions provided. I advised the patient to follow-up with their primary care provider this week. I advised the patient to return to the emergency department with new or worsening symptoms or new concerns. The patient verbalized understanding and agreement with plan.    This patient was discussed with and evaluated by Dr. Christy Gentles who agrees with assessment and plan.   Waynetta Pean, PA-C 08/29/14 1402  Ripley Fraise, MD 08/29/14 1455

## 2014-08-29 NOTE — ED Provider Notes (Signed)
Patient seen/examined in the Emergency Department in conjunction with Midlevel Provider  Patient reports cough/sob/wheeze Exam : awake/alert, wheezing bilaterally Plan: treat with nebs and reassess, pt needs to ambulate    Ripley Fraise, MD 08/29/14 1040

## 2014-08-29 NOTE — ED Notes (Signed)
Per pt, blood pressure fluctuates. States "it runs between 24-932 systolic."

## 2014-08-29 NOTE — Discharge Instructions (Signed)

## 2014-08-29 NOTE — ED Notes (Signed)
Pt to ED via [facility] - per EMS - pt has experienced new onset shortness of breath x3 days, did not seek treatment until today because she thought it would resolve, however has only stayed the same. Upon EMS arrival pt O2 sats at 97%. Pt was given 5 of albuterol as well as atrovent and 125 of solumedrol. Pt wears daily oxygen @ 2 L nasal cannula for COPD. Pt has hx of CHF and atrial fibrillation as well. In A fib at current time @ 100-110 bpm. VSS. Pt is a/o x4.  Pt has cough on arrival that is productive with yellow sputum, pt believes she ran a fever last night however did not measure temperature.

## 2014-08-29 NOTE — ED Notes (Signed)
Pt verbalizes understanding of discharge instructions regarding antibiotics and follow up with [name]. NAD on departure with [facility]. A/O x4.

## 2014-08-29 NOTE — ED Notes (Signed)
Pt transporting to xray. NAD.

## 2014-08-30 ENCOUNTER — Telehealth: Payer: Self-pay | Admitting: Pulmonary Disease

## 2014-08-30 NOTE — Telephone Encounter (Signed)
Called spoke with pt. She was seen in ED yesterday. She reports she is not able to come to our office for visit d/t transportation and needs 4 days notice. She scheduled appt to see RA in [place] on 7/27. NOTHING FURTHER NEEDED

## 2014-09-03 ENCOUNTER — Telehealth: Payer: Self-pay | Admitting: Internal Medicine

## 2014-09-03 ENCOUNTER — Other Ambulatory Visit: Payer: Self-pay | Admitting: Internal Medicine

## 2014-09-03 NOTE — Telephone Encounter (Signed)
Mellissa Kohut from [facility] called to check on the status of an  order for resumption of home health care for the patient. She stated that it was faxed on 5/14, 6/30, and 7/20. Please f/u

## 2014-09-05 ENCOUNTER — Ambulatory Visit: Payer: Medicare Other | Admitting: Pulmonary Disease

## 2014-09-17 ENCOUNTER — Telehealth: Payer: Self-pay | Admitting: Pulmonary Disease

## 2014-09-17 MED ORDER — CEFDINIR 300 MG PO CAPS: 300.0000 mg | ORAL_CAPSULE | Freq: Two times a day (BID) | ORAL | Status: DC

## 2014-09-17 NOTE — Telephone Encounter (Signed)
Omnicef 300 bid x 7 days

## 2014-09-17 NOTE — Telephone Encounter (Signed)
Called and spoke to pt. Informed pt of the recs per RA. Rx sent to preferred pharmacy. Pt verbalized understanding and denied any further questions or concerns at this time.

## 2014-09-17 NOTE — Telephone Encounter (Signed)
Called and spoke to pt. Pt c/oincrease in SOB, prod cough with yellow and green mucus (pt stated this mucus discoloration is her baseline but states the mucus is much more thick which is abnormal for her), and c/o chest congestion x 1 week. Pt denies f/c/s, swelling, CP/tightness. Pt states she is taking mucinex with little relief. Pt has upcoming appt with TP on 09/28/14, pt states due to transportation issues she probably wont be able to come in for a sooner appt.   [name] please advise.

## 2014-09-20 ENCOUNTER — Observation Stay (HOSPITAL_COMMUNITY)
Admission: EM | Admit: 2014-09-20 | Discharge: 2014-09-21 | Disposition: A | Discharge status: Home / Self Care | Payer: Medicare Other | Attending: Internal Medicine | Admitting: Internal Medicine

## 2014-09-20 ENCOUNTER — Encounter (HOSPITAL_COMMUNITY): Payer: Self-pay | Admitting: *Deleted

## 2014-09-20 ENCOUNTER — Emergency Department (HOSPITAL_COMMUNITY): Payer: Medicare Other

## 2014-09-20 DIAGNOSIS — Z7951 Long term (current) use of inhaled steroids: Secondary | ICD-10-CM | POA: Diagnosis not present

## 2014-09-20 DIAGNOSIS — G43909 Migraine, unspecified, not intractable, without status migrainosus: Secondary | ICD-10-CM | POA: Diagnosis not present

## 2014-09-20 DIAGNOSIS — I5032 Chronic diastolic (congestive) heart failure: Secondary | ICD-10-CM | POA: Diagnosis not present

## 2014-09-20 DIAGNOSIS — I509 Heart failure, unspecified: Secondary | ICD-10-CM | POA: Diagnosis not present

## 2014-09-20 DIAGNOSIS — Z8701 Personal history of pneumonia (recurrent): Secondary | ICD-10-CM | POA: Diagnosis not present

## 2014-09-20 DIAGNOSIS — G894 Chronic pain syndrome: Secondary | ICD-10-CM

## 2014-09-20 DIAGNOSIS — M1990 Unspecified osteoarthritis, unspecified site: Secondary | ICD-10-CM | POA: Insufficient documentation

## 2014-09-20 DIAGNOSIS — J441 Chronic obstructive pulmonary disease with (acute) exacerbation: Secondary | ICD-10-CM | POA: Diagnosis not present

## 2014-09-20 DIAGNOSIS — K219 Gastro-esophageal reflux disease without esophagitis: Secondary | ICD-10-CM | POA: Diagnosis not present

## 2014-09-20 DIAGNOSIS — F42 Obsessive-compulsive disorder: Secondary | ICD-10-CM | POA: Diagnosis not present

## 2014-09-20 DIAGNOSIS — K8020 Calculus of gallbladder without cholecystitis without obstruction: Secondary | ICD-10-CM | POA: Insufficient documentation

## 2014-09-20 DIAGNOSIS — F418 Other specified anxiety disorders: Secondary | ICD-10-CM

## 2014-09-20 DIAGNOSIS — I10 Essential (primary) hypertension: Secondary | ICD-10-CM | POA: Diagnosis not present

## 2014-09-20 DIAGNOSIS — I482 Chronic atrial fibrillation: Secondary | ICD-10-CM

## 2014-09-20 DIAGNOSIS — Z87891 Personal history of nicotine dependence: Secondary | ICD-10-CM | POA: Insufficient documentation

## 2014-09-20 DIAGNOSIS — E785 Hyperlipidemia, unspecified: Secondary | ICD-10-CM | POA: Insufficient documentation

## 2014-09-20 DIAGNOSIS — F329 Major depressive disorder, single episode, unspecified: Secondary | ICD-10-CM | POA: Diagnosis not present

## 2014-09-20 DIAGNOSIS — I4891 Unspecified atrial fibrillation: Secondary | ICD-10-CM | POA: Insufficient documentation

## 2014-09-20 DIAGNOSIS — R0602 Shortness of breath: Secondary | ICD-10-CM | POA: Diagnosis present

## 2014-09-20 DIAGNOSIS — Z79899 Other long term (current) drug therapy: Secondary | ICD-10-CM | POA: Diagnosis not present

## 2014-09-20 DIAGNOSIS — I340 Nonrheumatic mitral (valve) insufficiency: Secondary | ICD-10-CM | POA: Insufficient documentation

## 2014-09-20 DIAGNOSIS — F419 Anxiety disorder, unspecified: Secondary | ICD-10-CM | POA: Diagnosis not present

## 2014-09-20 DIAGNOSIS — G4700 Insomnia, unspecified: Secondary | ICD-10-CM | POA: Insufficient documentation

## 2014-09-20 DIAGNOSIS — I429 Cardiomyopathy, unspecified: Secondary | ICD-10-CM | POA: Diagnosis not present

## 2014-09-20 DIAGNOSIS — D649 Anemia, unspecified: Secondary | ICD-10-CM | POA: Diagnosis not present

## 2014-09-20 LAB — CBC WITH DIFFERENTIAL/PLATELET
BASOS PCT: 0 % (ref 0–1)
Basophils Absolute: 0 10*3/uL (ref 0.0–0.1)
EOS PCT: 3 % (ref 0–5)
Eosinophils Absolute: 0.2 10*3/uL (ref 0.0–0.7)
HCT: 37 % (ref 36.0–46.0)
Hemoglobin: 11.4 g/dL — ABNORMAL LOW (ref 12.0–15.0)
Lymphocytes Relative: 21 % (ref 12–46)
Lymphs Abs: 1.5 10*3/uL (ref 0.7–4.0)
MCH: 30.6 pg (ref 26.0–34.0)
MCHC: 30.8 g/dL (ref 30.0–36.0)
MCV: 99.5 fL (ref 78.0–100.0)
Monocytes Absolute: 0.7 10*3/uL (ref 0.1–1.0)
Monocytes Relative: 9 % (ref 3–12)
NEUTROS PCT: 67 % (ref 43–77)
Neutro Abs: 4.9 10*3/uL (ref 1.7–7.7)
PLATELETS: 332 10*3/uL (ref 150–400)
RBC: 3.72 MIL/uL — ABNORMAL LOW (ref 3.87–5.11)
RDW: 14.3 % (ref 11.5–15.5)
WBC: 7.3 10*3/uL (ref 4.0–10.5)

## 2014-09-20 LAB — BASIC METABOLIC PANEL
Anion gap: 8 (ref 5–15)
BUN: 12 mg/dL (ref 6–20)
CHLORIDE: 98 mmol/L — AB (ref 101–111)
CO2: 36 mmol/L — AB (ref 22–32)
CREATININE: 0.78 mg/dL (ref 0.44–1.00)
Calcium: 9.1 mg/dL (ref 8.9–10.3)
GFR calc Af Amer: >60 mL/min (ref >60)
GLUCOSE: 114 mg/dL — AB (ref 65–99)
Potassium: 4.3 mmol/L (ref 3.5–5.1)
Sodium: 142 mmol/L (ref 135–145)

## 2014-09-20 LAB — I-STAT TROPONIN, ED: TROPONIN I, POC: 0 ng/mL (ref 0.00–0.08)

## 2014-09-20 LAB — BRAIN NATRIURETIC PEPTIDE: B Natriuretic Peptide: 213.5 pg/mL — ABNORMAL HIGH (ref 0.0–100.0)

## 2014-09-20 MED ORDER — NYSTATIN 100000 UNIT/ML MT SUSP
500000.0000 [IU] | Freq: Every day | OROMUCOSAL | Status: DC | PRN
  Filled 2014-09-20: qty 5

## 2014-09-20 MED ORDER — PREDNISONE 20 MG PO TABS
60.0000 mg | ORAL_TABLET | Freq: Once | ORAL | Status: AC
  Administered 2014-09-20: 60 mg via ORAL
  Filled 2014-09-20: qty 3

## 2014-09-20 MED ORDER — GUAIFENESIN ER 600 MG PO TB12: 600.0000 mg | ORAL_TABLET | Freq: Two times a day (BID) | ORAL | Status: DC | PRN

## 2014-09-20 MED ORDER — IPRATROPIUM-ALBUTEROL 0.5-2.5 (3) MG/3ML IN SOLN
3.0000 mL | Freq: Four times a day (QID) | RESPIRATORY_TRACT | Status: DC
  Administered 2014-09-20: 3 mL via RESPIRATORY_TRACT
  Filled 2014-09-20: qty 3

## 2014-09-20 MED ORDER — HYPROMELLOSE (GONIOSCOPIC) 2.5 % OP SOLN
1.0000 [drp] | Freq: Every day | OPHTHALMIC | Status: DC | PRN
  Filled 2014-09-20: qty 15

## 2014-09-20 MED ORDER — ONDANSETRON HCL 4 MG PO TABS: 4.0000 mg | ORAL_TABLET | Freq: Four times a day (QID) | ORAL | Status: DC | PRN

## 2014-09-20 MED ORDER — DOXYCYCLINE HYCLATE 100 MG PO TABS
100.0000 mg | ORAL_TABLET | Freq: Once | ORAL | Status: AC
  Administered 2014-09-20: 100 mg via ORAL
  Filled 2014-09-20: qty 1

## 2014-09-20 MED ORDER — ALBUTEROL SULFATE (2.5 MG/3ML) 0.083% IN NEBU
2.5000 mg | INHALATION_SOLUTION | Freq: Once | RESPIRATORY_TRACT | Status: AC
  Administered 2014-09-20: 2.5 mg via RESPIRATORY_TRACT
  Filled 2014-09-20: qty 3

## 2014-09-20 MED ORDER — PANTOPRAZOLE SODIUM 40 MG PO TBEC
40.0000 mg | DELAYED_RELEASE_TABLET | Freq: Two times a day (BID) | ORAL | Status: DC
  Administered 2014-09-20 – 2014-09-21 (×2): 40 mg via ORAL
  Filled 2014-09-20 (×3): qty 1

## 2014-09-20 MED ORDER — ONDANSETRON HCL 4 MG/2ML IJ SOLN: 4.0000 mg | Freq: Four times a day (QID) | INTRAMUSCULAR | Status: DC | PRN

## 2014-09-20 MED ORDER — FLUOXETINE HCL 20 MG PO CAPS
60.0000 mg | ORAL_CAPSULE | Freq: Every day | ORAL | Status: DC
  Administered 2014-09-21: 60 mg via ORAL
  Filled 2014-09-20: qty 3

## 2014-09-20 MED ORDER — OXYCODONE-ACETAMINOPHEN 5-325 MG PO TABS
1.0000 | ORAL_TABLET | Freq: Two times a day (BID) | ORAL | Status: DC | PRN
Start: 2014-09-20 — End: 2014-09-21
  Administered 2014-09-20 – 2014-09-21 (×2): 1 via ORAL
  Filled 2014-09-20 (×2): qty 1

## 2014-09-20 MED ORDER — ACETAMINOPHEN 325 MG PO TABS: 650.0000 mg | ORAL_TABLET | Freq: Four times a day (QID) | ORAL | Status: DC | PRN

## 2014-09-20 MED ORDER — DIPHENHYDRAMINE HCL 25 MG PO TABS: 25.0000 mg | ORAL_TABLET | Freq: Four times a day (QID) | ORAL | Status: DC

## 2014-09-20 MED ORDER — SODIUM CHLORIDE 0.9 % IJ SOLN: 3.0000 mL | INTRAMUSCULAR | Status: DC | PRN

## 2014-09-20 MED ORDER — TIOTROPIUM BROMIDE MONOHYDRATE 18 MCG IN CAPS
18.0000 ug | ORAL_CAPSULE | Freq: Every day | RESPIRATORY_TRACT | Status: DC
  Filled 2014-09-20: qty 5

## 2014-09-20 MED ORDER — ENOXAPARIN SODIUM 40 MG/0.4ML ~~LOC~~ SOLN
40.0000 mg | SUBCUTANEOUS | Status: DC
Start: 2014-09-20 — End: 2014-09-21
  Administered 2014-09-20: 40 mg via SUBCUTANEOUS
  Filled 2014-09-20 (×2): qty 0.4

## 2014-09-20 MED ORDER — BUSPIRONE HCL 10 MG PO TABS
10.0000 mg | ORAL_TABLET | Freq: Three times a day (TID) | ORAL | Status: DC
  Administered 2014-09-20 – 2014-09-21 (×3): 10 mg via ORAL
  Filled 2014-09-20 (×5): qty 1

## 2014-09-20 MED ORDER — ALBUTEROL SULFATE (2.5 MG/3ML) 0.083% IN NEBU: 2.5000 mg | INHALATION_SOLUTION | Freq: Four times a day (QID) | RESPIRATORY_TRACT | Status: DC | PRN

## 2014-09-20 MED ORDER — DILTIAZEM HCL ER BEADS 300 MG PO CP24
420.0000 mg | ORAL_CAPSULE | Freq: Every day | ORAL | Status: DC
  Administered 2014-09-21: 420 mg via ORAL
  Filled 2014-09-20: qty 1

## 2014-09-20 MED ORDER — SODIUM CHLORIDE 0.9 % IV SOLN: 250.0000 mL | INTRAVENOUS | Status: DC | PRN

## 2014-09-20 MED ORDER — DOXYCYCLINE HYCLATE 100 MG PO TABS
100.0000 mg | ORAL_TABLET | Freq: Two times a day (BID) | ORAL | Status: DC
  Administered 2014-09-20 – 2014-09-21 (×2): 100 mg via ORAL
  Filled 2014-09-20 (×3): qty 1

## 2014-09-20 MED ORDER — SODIUM CHLORIDE 0.9 % IV SOLN
INTRAVENOUS | Status: AC
  Administered 2014-09-20 – 2014-09-21 (×2): via INTRAVENOUS

## 2014-09-20 MED ORDER — ALBUTEROL SULFATE (2.5 MG/3ML) 0.083% IN NEBU: 3.0000 mL | INHALATION_SOLUTION | Freq: Four times a day (QID) | RESPIRATORY_TRACT | Status: DC | PRN

## 2014-09-20 MED ORDER — MORPHINE SULFATE 2 MG/ML IJ SOLN: 2.0000 mg | INTRAMUSCULAR | Status: DC | PRN

## 2014-09-20 MED ORDER — BUDESONIDE-FORMOTEROL FUMARATE 160-4.5 MCG/ACT IN AERO
2.0000 | INHALATION_SPRAY | Freq: Two times a day (BID) | RESPIRATORY_TRACT | Status: DC
  Administered 2014-09-20 – 2014-09-21 (×2): 2 via RESPIRATORY_TRACT
  Filled 2014-09-20: qty 6

## 2014-09-20 MED ORDER — METHYLPREDNISOLONE SODIUM SUCC 125 MG IJ SOLR
60.0000 mg | Freq: Two times a day (BID) | INTRAMUSCULAR | Status: DC
  Administered 2014-09-20 – 2014-09-21 (×2): 60 mg via INTRAVENOUS
  Filled 2014-09-20 (×4): qty 0.96

## 2014-09-20 MED ORDER — CHLORPHENIRAMINE MALEATE 4 MG PO TABS
4.0000 mg | ORAL_TABLET | Freq: Three times a day (TID) | ORAL | Status: DC | PRN
  Filled 2014-09-20: qty 1

## 2014-09-20 MED ORDER — ACETAMINOPHEN 650 MG RE SUPP: 650.0000 mg | Freq: Four times a day (QID) | RECTAL | Status: DC | PRN

## 2014-09-20 MED ORDER — POLYVINYL ALCOHOL 1.4 % OP SOLN
1.0000 [drp] | OPHTHALMIC | Status: DC | PRN
  Filled 2014-09-20: qty 15

## 2014-09-20 MED ORDER — IPRATROPIUM-ALBUTEROL 0.5-2.5 (3) MG/3ML IN SOLN
3.0000 mL | Freq: Three times a day (TID) | RESPIRATORY_TRACT | Status: DC
  Administered 2014-09-21 (×2): 3 mL via RESPIRATORY_TRACT
  Filled 2014-09-20 (×2): qty 3

## 2014-09-20 MED ORDER — SODIUM CHLORIDE 0.9 % IJ SOLN
3.0000 mL | Freq: Two times a day (BID) | INTRAMUSCULAR | Status: DC
  Administered 2014-09-21: 3 mL via INTRAVENOUS

## 2014-09-20 MED ORDER — FUROSEMIDE 40 MG PO TABS
40.0000 mg | ORAL_TABLET | Freq: Every day | ORAL | Status: DC
  Administered 2014-09-21: 40 mg via ORAL
  Filled 2014-09-20: qty 1

## 2014-09-20 MED ORDER — IPRATROPIUM-ALBUTEROL 0.5-2.5 (3) MG/3ML IN SOLN
3.0000 mL | RESPIRATORY_TRACT | Status: AC
  Administered 2014-09-20 (×3): 3 mL via RESPIRATORY_TRACT
  Filled 2014-09-20: qty 3
  Filled 2014-09-20: qty 6

## 2014-09-20 MED ORDER — METOPROLOL SUCCINATE 12.5 MG HALF TABLET
12.5000 mg | ORAL_TABLET | Freq: Every day | ORAL | Status: DC
  Filled 2014-09-20: qty 1

## 2014-09-20 NOTE — Progress Notes (Addendum)
CM consult in EPIC for Pt would like assistance in getting a manual wheelchair. Pt is interested in getting home health with [facility]. She has used [facility] in the past. Please provide assistancePt would like assistance in getting a manual wheelchair. Pt is interested in getting home health with [facility]. She has used [place] in the past. Please provide assistance  CM spoke with pt about a manual w/c She informs CM she has an electric w/c that is tasking to get out of her low income housing Reports when she first got electric w/c she was living in a hotel for "thirteen years" and could go down a ramp but her new home does not have a ramp It has to take 2 people to get it down some stairs.  CM discussed possibly obtaining an inexpensive w/c from a local [employer] when pt stated w/c did not have to be new CM left pt with [facility] contact information and a list of Private duty nursing agencies  CM discussed she would inform [name] of her desire to have them to provide service for her but generally [name] would visit if the pt had a skilled need and home bound.  Pt discussed not being able to walk "fourteen steps to my bathroom today and back"  Discussed [name] would have to verify with medicare and  Referral sent to [facility] [place] at [place] states she will see pt on 09/21/14 am

## 2014-09-20 NOTE — ED Notes (Signed)
Patient contacted EMS today d/t increased SOB, pt has hx of COPD and current tobacco use. Pt reports only feeling well while on neb. She wears O2 at 3 Lt. HR irregular, 125 mg solumedrol and Albuterol/Atrovent given in field

## 2014-09-20 NOTE — ED Notes (Signed)
Attempted to call report, RN unavailable at this time. Awaiting return call

## 2014-09-20 NOTE — ED Notes (Signed)
Delay in lab draw edp in with pt

## 2014-09-20 NOTE — H&P (Signed)
[facility] History and Physical  Kelsey Garcia:[id number] DOB: April 30, 1945 DOA: 09/20/2014  Referring physician: Dr. Forde Dandy, [place] PCP: No PCP Per Patient  Specialists: Dr. Kara Mead, pulmonology   Chief Complaint: Shortness of breath  HPI: Kelsey Garcia is a 69 y.o. female  With a history of COPD, diastolic heart failure, atrial fibrillation that presented to the emergency department with complaints of shortness of breath. Patient states that since March 2016 when the "doctors gave her pneumonia" she has not been back to her baseline breathing. She has been on nasal cannula, 2 L of oxygen daily. Patient otherwise couple of weeks has had prednisone taper as well as into by first COPD exacerbation. Approximately 3 days ago, patient's cough as well as shortness of breath was worsening. She was using her albuterol nebulizers around-the-clock with no resolution of her symptoms. Patient states that she also had a productive sputum, clear in color. Patient denies being around any sick contacts or recent travel. She does state her daughter and her live together and her daughter still actively smokes and refuses to tell her to stop smoking. Patient states that she follows up with [name], pulmonologist and spaces to see him next week. In the emergency department, chest x-ray showed mild bronchitis, no pneumonia. Patient was given 3 DuoNeb treatments, 60 mg of prednisone and started on doxycycline. She is noted to have a saturation of 95% however this dropped to 88% with ambulation. Patient became more short of breath and had increased work of breathing. TR[facility] asked to admit.  Review of Systems:  Constitutional: Denies fever, chills, diaphoresis, appetite change and fatigue.  HEENT: Denies photophobia, eye pain, redness, hearing loss, ear pain, congestion, sore throat, rhinorrhea, sneezing, mouth sores, trouble swallowing, neck pain, neck stiffness and tinnitus.   Respiratory: Complains of  cough, wheezing, shortness of breath. Cardiovascular: Denies chest pain, palpitations and leg swelling.  Gastrointestinal: Denies nausea, vomiting, abdominal pain, diarrhea, constipation, blood in stool and abdominal distention.  Genitourinary: Denies dysuria, urgency, frequency, hematuria, flank pain and difficulty urinating.  Musculoskeletal: Denies myalgias, back pain, joint swelling, arthralgias and gait problem.  Skin: Denies pallor, rash and wound.  Neurological: Denies dizziness, seizures, syncope, weakness, light-headedness, numbness and headaches.  Hematological: Denies adenopathy. Easy bruising, personal or family bleeding history  Psychiatric/Behavioral: Denies suicidal ideation, mood changes, confusion, nervousness, sleep disturbance and agitation  Past Medical History  Diagnosis Date  . Atrial fibrillation   . Hypertension   . Insomnia   . Nonischemic cardiomyopathy   . Hyperlipidemia   . Anxiety   . Cholelithiasis   . Insomnia   . Mitral regurgitation     noted 2010  . H/O epistaxis   . Rhinitis, allergic   . CHF (congestive heart failure)   . Pneumonia     "several times w/exacerbations of the COPD; nothing in the last year" (07/15/2012)  . COPD (chronic obstructive pulmonary disease)     as of 7/13 on 2-3L, pfts 10/2008 with mod obstruction  . Chronic bronchitis with COPD (chronic obstructive pulmonary disease)   . Shortness of breath     "all the time right now" (07/15/2012)  . GERD (gastroesophageal reflux disease)   . VZCHYIFO(277.4)     "weekly" (07/15/2012)  . Migraines     "weekly for awhile; cleared up as I got older" (07/15/2012)  . DJD (degenerative joint disease)   . Arthritis     "all over" (07/15/2012)  . OCD (obsessive compulsive disorder)   .  OCD (obsessive compulsive disorder)   . Depression     h/o SI; "last time I was really serious about it was ~ 1997" (07/15/2012)   Past Surgical History  Procedure Laterality Date  . Tubal ligation  1972  .  Cardioversion  2003; 07/2003   Social History:  reports that she quit smoking about 3 months ago. Her smoking use included Cigarettes. She has a 3.5 pack-year smoking history. She has never used smokeless tobacco. She reports that she does not drink alcohol or use illicit drugs.  Allergies  Allergen Reactions  . Ace Inhibitors Cough  . Diphenhydramine Hcl Other (See Comments)    "feels like my skin is crawling, and legs twitches"  . Erythromycin Diarrhea  . Nsaids Nausea And Vomiting  . Nyquil [Pseudoeph-Doxylamine-Dm-Apap] Itching  . Tramadol Hcl Other (See Comments)    stomach pain, hallucinations  . Codeine Swelling    Face and lips swelling  Patient report Can tolerate hydrocodone  . Tomato Other (See Comments)    Stomach swells  . Orange Fruit [Citrus] Rash  . Pseudoephedrine Palpitations    Family History  Problem Relation Age of Onset  . Asthma    . Emphysema    . Allergies    . Cancer      aunt had several types of cancer  . COPD Mother   . Emphysema Mother   . Cirrhosis Father     Prior to Admission medications   Medication Sig Start Date End Date Taking? Authorizing Provider  albuterol (PROVENTIL HFA;VENTOLIN HFA) 108 (90 BASE) MCG/ACT inhaler Inhale 1-2 puffs into the lungs every 6 (six) hours as needed for wheezing. 04/28/14  Yes Tanna Furry, MD  albuterol (PROVENTIL) (2.5 MG/3ML) 0.083% nebulizer solution Take 3 mLs (2.5 mg total) by nebulization every 6 (six) hours as needed for wheezing. 07/08/14  Yes Theodis Blaze, MD  budesonide-formoterol [facility]) 160-4.5 MCG/ACT inhaler Inhale 2 puffs into the lungs 2 (two) times daily. 08/07/14  Yes Rigoberto Noel, MD  busPIRone (BUSPAR) 10 MG tablet Take 10 mg by mouth 3 (three) times daily. 04/22/14  Yes Historical Provider, MD  chlorpheniramine (CHLOR-TRIMETON) 4 MG tablet Take 1 tablet (4 mg total) by mouth every 8 (eight) hours as needed for allergies. Patient taking differently: Take 4 mg by mouth every 8 (eight)  hours.  11/13/13  Yes Tasrif Ahmed, MD  diltiazem (TIAZAC) 420 MG 24 hr capsule Take 1 capsule (420 mg total) by mouth daily. 05/09/14 08/08/15 Yes Bartholomew Crews, MD  FLUoxetine (PROZAC) 20 MG capsule TAKE THREE CAPSULES BY MOUTH ONCE DAILY Patient taking differently: Take 60 mg by mouth daily.  01/03/14  Yes Tasrif Ahmed, MD  furosemide (LASIX) 20 MG tablet Take 40 mg by mouth daily.    Yes Historical Provider, MD  guaiFENesin (MUCINEX) 600 MG 12 hr tablet Take 1 tablet (600 mg total) by mouth 2 (two) times daily. Patient taking differently: Take 600 mg by mouth 2 (two) times daily as needed for cough.  04/28/14  Yes Tanna Furry, MD  hydroxypropyl methylcellulose / hypromellose (ISOPTO TEARS / GONIOVISC) 2.5 % ophthalmic solution Place 1 drop into both eyes daily as needed for dry eyes (dry eyes).    Yes Historical Provider, MD  metoprolol succinate (TOPROL-XL) 25 MG 24 hr tablet Take 0.5 tablets (12.5 mg total) by mouth daily after lunch. 07/02/14  Yes Arnoldo Morale, MD  nystatin (MYCOSTATIN) 100000 UNIT/ML suspension Take 5 mLs (500,000 Units total) by mouth as needed. Patient taking  differently: Take 500,000 Units by mouth daily as needed (thrush).  04/16/14  Yes Tasrif Ahmed, MD  oxyCODONE-acetaminophen (PERCOCET/ROXICET) 5-325 MG per tablet Take 1 tablet by mouth every 12 (twelve) hours as needed for severe pain.   Yes Historical Provider, MD  pantoprazole (PROTONIX) 40 MG tablet TAKE ONE TABLET BY MOUTH TWICE DAILY Patient taking differently: Take 40 mg by mouth 2 (two) times daily.  01/03/14  Yes Tasrif Ahmed, MD  tiotropium (SPIRIVA) 18 MCG inhalation capsule Place 1 capsule (18 mcg total) into inhaler and inhale daily. 08/07/14  Yes Rigoberto Noel, MD  amoxicillin-clavulanate (AUGMENTIN) 875-125 MG per tablet Take 1 tablet by mouth every 12 (twelve) hours. Patient not taking: Reported on 09/20/2014 08/29/14   Waynetta Pean, PA-C  cefdinir (OMNICEF) 300 MG capsule Take 1 capsule (300 mg total) by  mouth 2 (two) times daily. Patient not taking: Reported on 09/20/2014 09/17/14   Rigoberto Noel, MD   Physical Exam: Filed Vitals:   09/20/14 1642  BP: 96/60  Pulse: 100  Temp:   Resp: 20     General: Well developed, well nourished, NAD, appears stated age  HEENT: NCAT, PERRLA, EOMI, Anicteic Sclera, mucous membranes moist.   Neck: Supple, no JVD, no masses  Cardiovascular: S1 S2 auscultated, irregular  Respiratory: Expiratory wheezing noted in all lung fields  Abdomen: Soft, nontender, nondistended, + bowel sounds  Extremities: warm dry without cyanosis clubbing or edema  Neuro: AAOx3, cranial nerves grossly intact. Strength 5/5 in patient's upper and lower extremities bilaterally  Skin: Without rashes exudates or nodules, bruising noted on patient's arms and chest  Psych: Normal affect and demeanor with intact judgement and insight  Labs on Admission:  Basic Metabolic Panel:  Recent Labs Lab 09/20/14 1252  NA 142  K 4.3  CL 98*  CO2 36*  GLUCOSE 114*  BUN 12  CREATININE 0.78  CALCIUM 9.1   Liver Function Tests: No results for input(s): AST, ALT, ALKPHOS, BILITOT, PROT, ALBUMIN in the last 168 hours. No results for input(s): LIPASE, AMYLASE in the last 168 hours. No results for input(s): AMMONIA in the last 168 hours. CBC:  Recent Labs Lab 09/20/14 1300  WBC 7.3  NEUTROABS 4.9  HGB 11.4*  HCT 37.0  MCV 99.5  PLT 332   Cardiac Enzymes: No results for input(s): CKTOTAL, CKMB, CKMBINDEX, TROPONINI in the last 168 hours.  BNP (last 3 results)  Recent Labs  07/19/14 2056 08/29/14 0945 09/20/14 1300  BNP 287.6* 114.9* 213.5*    ProBNP (last 3 results) No results for input(s): PROBNP in the last 8760 hours.  CBG: No results for input(s): GLUCAP in the last 168 hours.  Radiological Exams on Admission: Dg Chest 2 View  09/20/2014   CLINICAL DATA:  Shortness of breath, cough for 10 days which is worsening  EXAM: CHEST  2 VIEW  COMPARISON:  Chest  x-ray of 08/29/2014  FINDINGS: No definite pneumonia or effusion is seen. There is some peribronchial thickening which may indicate bronchitis. Also minimally prominent interstitial markings are noted which may be chronic, but a viral pneumonia cannot be excluded. The heart is mildly enlarged and stable. No acute bony abnormality is seen.  IMPRESSION: 1. Peribronchial thickening may indicate bronchitis. 2. Minimally prominent interstitial markings may be chronic, but a viral process cannot be excluded.   Electronically Signed   By: Ivar Drape M.D.   On: 09/20/2014 13:57    EKG: Independently reviewed. A. fib, rate 75  Assessment/Plan  Mild COPD  Exacerbation -Will admit patient to medical floor for observation -CXR: Peribronchial thickening indicating bronchitis, interstitial markings of chronic -Will place patient on Solu-Medrol, DuoNeb, guaifenesin, incentive spirometry, doxycycline -Will speak with pulmonology -Continue her medications, -Continue supplemental oxygen, patient does wear 2 L at home -Of note, patient does have a smoker in her home however refuses to tell the smoker to smoke outside  Atrial fibrillation -Currently rate controlled -CHADSVASC 4 -Patient currently not on anticoagulation per choice -Continue Cardizem, metoprolol, aspirin  Chronic pain syndrome -Continue home regimen  Essential hypertension -Continue Lasix, metoprolol, diltiazem  Chronic diastolic heart failure -Echocardiogram 02/15/2014 showed EF of 50-55% -Continue Lasix, metoprolol, aspirin  Depression/anxiety/OCD -Continue BuSpar, Prozac  DVT prophylaxis: Lovenox  Code Status: Full  Condition: Guarded   Family Communication: None at bedside. Admission, patients condition and plan of care including tests being ordered have been discussed with the patient, who indicates understanding and agrees with the plan and Code Status.  Disposition Plan: Admitted for observation  Time spent: 60  minutes  Kambry Takacs D.O. [facility] Pager [phone]  If 7PM-7AM, please contact night-coverage [email or web address] Password [facility] 09/20/2014, 5:05 PM

## 2014-09-20 NOTE — ED Notes (Signed)
Pt is A&Ox4, reports SOB getting worse over the last week although she has not ben feeling well over the last month.  PCP office contacted and abx prescribed but have not been picked up from pharmacy.

## 2014-09-20 NOTE — ED Notes (Signed)
Bed: GU44 Expected date:  Expected time:  Means of arrival:  Comments: copd

## 2014-09-20 NOTE — Progress Notes (Signed)
Received report from [employer]. Pt arrived unit from ED, alert and oriented, able to communicate needs. MD notified of Pt's location. Will continue with current plan of care.

## 2014-09-20 NOTE — ED Provider Notes (Signed)
CSN: [id number]     Arrival date & time 09/20/14  1215 History   First MD Initiated Contact with Patient 09/20/14 1232     Chief Complaint  Patient presents with  . Shortness of Breath     (Consider location/radiation/quality/duration/timing/severity/associated sxs/prior Treatment) HPI 69 year old female who presents with shortness of breath. History of COPD on 2 L home oxygen, NICMP, HTN, Atrial fibrillation. Long time tobacco smoker. Reports having pneumonia in 04/2014, and not feeling well since then. She has had progressive shortness of breath for the past week. She has been coughing more with increasing sputum production. Reports subjective fevers and chills. Denies weight gain or LE edema, but does wake up at night with sob and feels a little sob lying on baseline 3 pillows at night. Dry weight 178lbs. Denies chest pain, N/V, or abdominal pain   Past Medical History  Diagnosis Date  . Atrial fibrillation   . Hypertension   . Insomnia   . Nonischemic cardiomyopathy   . Hyperlipidemia   . Anxiety   . Cholelithiasis   . Insomnia   . Mitral regurgitation     noted 2010  . H/O epistaxis   . Rhinitis, allergic   . CHF (congestive heart failure)   . Pneumonia     "several times w/exacerbations of the COPD; nothing in the last year" (07/15/2012)  . COPD (chronic obstructive pulmonary disease)     as of 7/13 on 2-3L, pfts 10/2008 with mod obstruction  . Chronic bronchitis with COPD (chronic obstructive pulmonary disease)   . Shortness of breath     "all the time right now" (07/15/2012)  . GERD (gastroesophageal reflux disease)   . JMEQASTM(196.2)     "weekly" (07/15/2012)  . Migraines     "weekly for awhile; cleared up as I got older" (07/15/2012)  . DJD (degenerative joint disease)   . Arthritis     "all over" (07/15/2012)  . OCD (obsessive compulsive disorder)   . OCD (obsessive compulsive disorder)   . Depression     h/o SI; "last time I was really serious about it was ~ 1997"  (07/15/2012)   Past Surgical History  Procedure Laterality Date  . Tubal ligation  1972  . Cardioversion  2003; 07/2003   Family History  Problem Relation Age of Onset  . Asthma    . Emphysema    . Allergies    . Cancer      aunt had several types of cancer  . COPD Mother   . Emphysema Mother   . Cirrhosis Father    Social History  Substance Use Topics  . Smoking status: Former Smoker -- 0.10 packs/day for 35 years    Types: Cigarettes    Quit date: 06/18/2014  . Smokeless tobacco: Never Used     Comment: No desire to quit at this time./ 1PPD  . Alcohol Use: No   OB History    No data available     Review of Systems 10/14 systems reviewed and are negative other than those stated in the HPI    Allergies  Ace inhibitors; Diphenhydramine hcl; Erythromycin; Nsaids; Nyquil; Tramadol hcl; Codeine; Tomato; Orange fruit; and Pseudoephedrine  Home Medications   Prior to Admission medications   Medication Sig Start Date End Date Taking? Authorizing Provider  albuterol (PROVENTIL HFA;VENTOLIN HFA) 108 (90 BASE) MCG/ACT inhaler Inhale 1-2 puffs into the lungs every 6 (six) hours as needed for wheezing. 04/28/14  Yes Tanna Furry, MD  albuterol (PROVENTIL) (  2.5 MG/3ML) 0.083% nebulizer solution Take 3 mLs (2.5 mg total) by nebulization every 6 (six) hours as needed for wheezing. 07/08/14  Yes Theodis Blaze, MD  budesonide-formoterol [facility]) 160-4.5 MCG/ACT inhaler Inhale 2 puffs into the lungs 2 (two) times daily. 08/07/14  Yes Rigoberto Noel, MD  busPIRone (BUSPAR) 10 MG tablet Take 10 mg by mouth 3 (three) times daily. 04/22/14  Yes Historical Provider, MD  chlorpheniramine (CHLOR-TRIMETON) 4 MG tablet Take 1 tablet (4 mg total) by mouth every 8 (eight) hours as needed for allergies. Patient taking differently: Take 4 mg by mouth every 8 (eight) hours.  11/13/13  Yes Tasrif Ahmed, MD  diltiazem (TIAZAC) 420 MG 24 hr capsule Take 1 capsule (420 mg total) by mouth daily. 05/09/14 08/08/15  Yes Bartholomew Crews, MD  FLUoxetine (PROZAC) 20 MG capsule TAKE THREE CAPSULES BY MOUTH ONCE DAILY Patient taking differently: Take 60 mg by mouth daily.  01/03/14  Yes Tasrif Ahmed, MD  furosemide (LASIX) 20 MG tablet Take 40 mg by mouth daily.    Yes Historical Provider, MD  guaiFENesin (MUCINEX) 600 MG 12 hr tablet Take 1 tablet (600 mg total) by mouth 2 (two) times daily. Patient taking differently: Take 600 mg by mouth 2 (two) times daily as needed for cough.  04/28/14  Yes Tanna Furry, MD  hydroxypropyl methylcellulose / hypromellose (ISOPTO TEARS / GONIOVISC) 2.5 % ophthalmic solution Place 1 drop into both eyes daily as needed for dry eyes (dry eyes).    Yes Historical Provider, MD  metoprolol succinate (TOPROL-XL) 25 MG 24 hr tablet Take 0.5 tablets (12.5 mg total) by mouth daily after lunch. 07/02/14  Yes Arnoldo Morale, MD  nystatin (MYCOSTATIN) 100000 UNIT/ML suspension Take 5 mLs (500,000 Units total) by mouth as needed. Patient taking differently: Take 500,000 Units by mouth daily as needed (thrush).  04/16/14  Yes Tasrif Ahmed, MD  oxyCODONE-acetaminophen (PERCOCET/ROXICET) 5-325 MG per tablet Take 1 tablet by mouth every 12 (twelve) hours as needed for severe pain.   Yes Historical Provider, MD  pantoprazole (PROTONIX) 40 MG tablet TAKE ONE TABLET BY MOUTH TWICE DAILY Patient taking differently: Take 40 mg by mouth 2 (two) times daily.  01/03/14  Yes Tasrif Ahmed, MD  tiotropium (SPIRIVA) 18 MCG inhalation capsule Place 1 capsule (18 mcg total) into inhaler and inhale daily. 08/07/14  Yes Rigoberto Noel, MD  amoxicillin-clavulanate (AUGMENTIN) 875-125 MG per tablet Take 1 tablet by mouth every 12 (twelve) hours. Patient not taking: Reported on 09/20/2014 08/29/14   Waynetta Pean, PA-C  cefdinir (OMNICEF) 300 MG capsule Take 1 capsule (300 mg total) by mouth 2 (two) times daily. Patient not taking: Reported on 09/20/2014 09/17/14   Rigoberto Noel, MD   BP 114/100 mmHg  Pulse 87  Temp(Src)  98.4 F (36.9 C) (Axillary)  Resp 21  SpO2 98% Physical Exam Physical Exam  Nursing note and vitals reviewed. Constitutional: Well developed, well nourished, non-toxic, and in no acute distress Head: Normocephalic and atraumatic.  Mouth/Throat: Oropharynx is clear and moist.  Neck: Normal range of motion. Neck supple.  Cardiovascular: Normal rate and regular rhythm.   no lower extremity edema Pulmonary/Chest: Effort normal. No conversational dyspnea. Diffuse inspiratory and expiratory wheezing throughout.  Abdominal: Soft. There is no tenderness. There is no rebound and no guarding.  Musculoskeletal: Normal range of motion.  Neurological: Alert, no facial droop, fluent speech, moves all extremities symmetrically Skin: Skin is warm and dry.  Psychiatric: Cooperative  ED Course  Procedures (  including critical care time) Labs Review Labs Reviewed  BASIC METABOLIC PANEL - Abnormal; Notable for the following:    Chloride 98 (*)    CO2 36 (*)    Glucose, Bld 114 (*)    All other components within normal limits  CBC WITH DIFFERENTIAL/PLATELET - Abnormal; Notable for the following:    RBC 3.72 (*)    Hemoglobin 11.4 (*)    All other components within normal limits  BRAIN NATRIURETIC PEPTIDE - Abnormal; Notable for the following:    B Natriuretic Peptide 213.5 (*)    All other components within normal limits  I-STAT TROPOININ, ED    Imaging Review Dg Chest 2 View  09/20/2014   CLINICAL DATA:  Shortness of breath, cough for 10 days which is worsening  EXAM: CHEST  2 VIEW  COMPARISON:  Chest x-ray of 08/29/2014  FINDINGS: No definite pneumonia or effusion is seen. There is some peribronchial thickening which may indicate bronchitis. Also minimally prominent interstitial markings are noted which may be chronic, but a viral pneumonia cannot be excluded. The heart is mildly enlarged and stable. No acute bony abnormality is seen.  IMPRESSION: 1. Peribronchial thickening may indicate  bronchitis. 2. Minimally prominent interstitial markings may be chronic, but a viral process cannot be excluded.   Electronically Signed   By: Ivar Drape M.D.   On: 09/20/2014 13:57     EKG Interpretation   Date/Time:  Thursday September 20 2014 12:53:20 EDT Ventricular Rate:  75 PR Interval:    QRS Duration: 97 QT Interval:  442 QTC Calculation: 494 R Axis:   73 Text Interpretation:  Atrial fibrillation Left anterior fasicular block  Poor r-wave progression Baseline wander in V6 Confirmed by Haylie Mccutcheon MD, Ylonda Storr  [phone]) on 09/20/2014 3:59:34 PM      MDM   Final diagnoses:  None    69 year old female with history of nonischemic cardiomyopathy and COPD on 2 L nasal cannula who presents the emergency department with shortness of breath, cough, and increased sputum production for one week. She is nontoxic in no acute distress on presentation. She is normal oxygenation on baseline 2 L nasal cannula and has no evidence of conversational dyspnea. She does have diffuse inspiratory and expiratory wheezing throughout, consistent with likely COPD exacerbation. She clinically does not appear fluid overloaded, and is at her dry weight. Chest x-ray also does not show pulmonary edema or other infiltrate or cardiopulmonary processes. This does not appear to be CHF exacerbation. She is treated for COPD exacerbation with DuoNeb 3, 60 mg of prednisone, and 100 mg of doxycycline.   Blood work including CBC, basic metabolic profile and troponin are negative. Her EKG is nonischemic and unchanged from prior EKG. I do not feel that this is consistent with underlying ACS.  Walking pulse ox is performed. With ambulation she is saturating 95%, however upon return to the bathroom she has increased work of breathing with pulse ox drop to 88%. She continues to feel unwell and does not feel significant improvement in her breathing. She is admitted to the hospitalist service for observation for continued treatments.,   Forde Dandy, MD 09/20/14 1700

## 2014-09-21 DIAGNOSIS — I5032 Chronic diastolic (congestive) heart failure: Secondary | ICD-10-CM | POA: Diagnosis not present

## 2014-09-21 DIAGNOSIS — I10 Essential (primary) hypertension: Secondary | ICD-10-CM | POA: Diagnosis not present

## 2014-09-21 DIAGNOSIS — D649 Anemia, unspecified: Secondary | ICD-10-CM

## 2014-09-21 DIAGNOSIS — J441 Chronic obstructive pulmonary disease with (acute) exacerbation: Secondary | ICD-10-CM | POA: Diagnosis not present

## 2014-09-21 LAB — CBC
HCT: 33.3 % — ABNORMAL LOW (ref 36.0–46.0)
Hemoglobin: 10.2 g/dL — ABNORMAL LOW (ref 12.0–15.0)
MCH: 29.7 pg (ref 26.0–34.0)
MCHC: 30.6 g/dL (ref 30.0–36.0)
MCV: 96.8 fL (ref 78.0–100.0)
Platelets: 308 10*3/uL (ref 150–400)
RBC: 3.44 MIL/uL — ABNORMAL LOW (ref 3.87–5.11)
RDW: 14.1 % (ref 11.5–15.5)
WBC: 9.4 10*3/uL (ref 4.0–10.5)

## 2014-09-21 LAB — BASIC METABOLIC PANEL
ANION GAP: 9 (ref 5–15)
BUN: 18 mg/dL (ref 6–20)
CHLORIDE: 96 mmol/L — AB (ref 101–111)
CO2: 32 mmol/L (ref 22–32)
Calcium: 9 mg/dL (ref 8.9–10.3)
Creatinine, Ser: 0.79 mg/dL (ref 0.44–1.00)
GFR calc non Af Amer: >60 mL/min (ref >60)
Glucose, Bld: 182 mg/dL — ABNORMAL HIGH (ref 65–99)
Potassium: 3.5 mmol/L (ref 3.5–5.1)
Sodium: 137 mmol/L (ref 135–145)

## 2014-09-21 MED ORDER — IPRATROPIUM-ALBUTEROL 0.5-2.5 (3) MG/3ML IN SOLN: 3.0000 mL | Freq: Three times a day (TID) | RESPIRATORY_TRACT | Status: DC

## 2014-09-21 MED ORDER — PREDNISONE 10 MG PO TABS: 10.0000 mg | ORAL_TABLET | Freq: Every day | ORAL | Status: DC

## 2014-09-21 MED ORDER — ALBUTEROL SULFATE (2.5 MG/3ML) 0.083% IN NEBU: 2.5000 mg | INHALATION_SOLUTION | Freq: Four times a day (QID) | RESPIRATORY_TRACT | Status: DC | PRN

## 2014-09-21 MED ORDER — DOXYCYCLINE HYCLATE 100 MG PO TABS: 100.0000 mg | ORAL_TABLET | Freq: Two times a day (BID) | ORAL | Status: DC

## 2014-09-21 NOTE — Progress Notes (Signed)
Nursing Discharge Summary  Patient ID: CONI HOMESLEY MRN: [id number] DOB/AGE: Feb 18, 1945 69 y.o.  Admit date: 09/20/2014 Discharge date: 09/21/2014  Discharged Condition: good  Disposition: 01-Home or Self Care  Follow-up Information    Follow up with Darden Amber, PA. Schedule an appointment as soon as possible for a visit in 1 week.   Why:  Hospital follow up is on Friday the 19th at 2pm.   Contact information:   [address] [place] [address] [phone]       Follow up with Rigoberto Noel., MD. Call in 1 week.   Specialty:  Pulmonary Disease   Why:  For follow up appointment   Contact information:   [address] [address] [phone]       Prescriptions Given: Prescription sent with patient.  Patient medications and follow up appointments addressed. Patient verbalized understanding without further questions.   Means of Discharge: Patient taken downstairs via wheelchair to be discharged home via taxi.    Signed: Buel Ream 09/21/2014, 6:50 PM

## 2014-09-21 NOTE — Progress Notes (Signed)
Pt for discharge to home. CSW received notification that pt needed assistance with transportation by taxi home.   CSW confirmed address with pt at bedside. Pt confirmed that pt daughter is at home and will be able to assist pt into the home. Pt awaiting portable oxygen tank before taxi can be arranged.   CSW provided RN with cab voucher in order for RN to arrange cab once portable oxygen arrives. RN to call [employer] [phone]).   No further social work needs identified at this time.  CSW signing off.   Alison Murray, MSW, [place] Work [phone]

## 2014-09-21 NOTE — Care Management Note (Signed)
Case Management Note  Patient Details  Name: Kelsey Garcia MRN: [id number] Date of Birth: [birth year]  Subjective/Objective:        69 yo admitted with COPD exacerbation            Action/Plan: From home with daughter  Expected Discharge Date:   (unknown)               Expected Discharge Plan:  [place]  In-House Referral:     Discharge planning Services  CM Consult  Post Acute Care Choice:  Home Health Choice offered to:  Patient  DME Arranged:  Oxygen DME Agency:  [place]:    [place]:  [place]  Status of Service:  Completed, signed off  Medicare Important Message Given:    Date Medicare IM Given:    Medicare IM give by:    Date Additional Medicare IM Given:    Additional Medicare Important Message give by:     If discussed at [place] of Stay Meetings, dates discussed:    Additional Comments: Pt currently on home 02 and has electric wheelchair. Pt requesting manual wheelchair to get around better outside home. Pt received her electric wheelchair 3 years ago and would not be eligible for medicare to pay for another wheelchair at this time.  Pt had already been referred to [facility] agency by ED Case Manager and MD has been asked to write [facility] orders. Pt without transportation home and CSW arranging for cab voucher. [place] DME rep to bring portable 02 tank for transport home. Pt reports that daughter will be at the home to help get pt into the home. No other CM needs noted for DC. Lynnell Catalan, RN 09/21/2014, 3:18 PM

## 2014-09-21 NOTE — Progress Notes (Addendum)
Arvilla Meres RN called stating pt is awaiting oxygen for d/c home  Cm notes unit CM has noted [facility] staff notified to bring oxygen to pt room  1725 Cm called [facility] and spoke with Lilia Pro who is unable to find orders in office system for oxygen, requesting orders be refaxed to office so oxygen can be delivered to the hospital for pt Cm  1729 Left a voice message at [phone] DME coordinator for [facility] requesting a return call to ED CM  1732 Return call from [facility] DME coordinator Oxygen present at [facility]. Instructions provided to give to 3W RN and pt will be able to d/c with her oxygen  1733 ED CM returned a call to [place], [place] RN to inform her Oxygen is ready Provided instructions given by [facility] DME coordinator and requested a return call to CM if issues  1750 Call from [place] obtained Updated Lattie Haw at [facility] [phone] that oxygen received

## 2014-09-21 NOTE — Discharge Summary (Signed)
Physician Discharge Summary  Kelsey Garcia:[id number] DOB: 1946/01/09 DOA: 09/20/2014  PCP: Darden Amber, PA  Admit date: 09/20/2014 Discharge date: 09/21/2014  Time spent: 45 minutes  Recommendations for Outpatient Follow-up:  Patient will be discharged to home.  Patient will need to follow up with primary care provider within one week of discharge.  Patient should also follow-up with [name], pulmonologist, in 1 week. Patient should continue medications as prescribed.  Patient should follow a heart healthy diet.   Discharge Diagnoses:  Mild COPD exacerbation Atrial fibrillation Chronic pain syndrome Essential hypertension Chronic diastolic heart failure Depression/anxiety/OCD Chronic normocytic anemia  Discharge Condition: Stable  Diet recommendation: Heart healthy  Filed Weights   09/21/14 0442  Weight: 80.7 kg (177 lb 14.6 oz)    History of present illness:  On 09/20/2014  Kelsey Garcia is a 69 y.o. female with a history of COPD, diastolic heart failure, atrial fibrillation that presented to the emergency department with complaints of shortness of breath. Patient states that since March 2016 when the "doctors gave her pneumonia" she has not been back to her baseline breathing. She has been on nasal cannula, 2 L of oxygen daily. Patient otherwise couple of weeks has had prednisone taper as well as into by first COPD exacerbation. Approximately 3 days ago, patient's cough as well as shortness of breath was worsening. She was using her albuterol nebulizers around-the-clock with no resolution of her symptoms. Patient states that she also had a productive sputum, clear in color. Patient denies being around any sick contacts or recent travel. She does state her daughter and her live together and her daughter still actively smokes and refuses to tell her to stop smoking. Patient states that she follows up with [name], pulmonologist and spaces to see him next week. In the emergency  department, chest x-ray showed mild bronchitis, no pneumonia. Patient was given 3 DuoNeb treatments, 60 mg of prednisone and started on doxycycline. She is noted to have a saturation of 95% however this dropped to 88% with ambulation. Patient became more short of breath and had increased work of breathing. TRH asked to admit.  Hospital Course:  Mild COPD Exacerbation -Will admit patient to medical floor for observation -CXR: Peribronchial thickening indicating bronchitis, interstitial markings of chronic -Patient placed on doxycycline, DuoNeb treatments and Solu-Medrol -Continue her medications, Spiriva, Symbicort, nebulizer treatments -Continue supplemental oxygen, patient does wear 2 L at home -Of note, patient has exposure to many allergens, daughter smokes, has cats, etc- patient has no intentions of changing her living situation -Will discharge patient with doxycycline, neb treatments, prednisone taper -Strongly urged patient to follow-up with her pulmonologist.  Atrial fibrillation -Currently rate controlled -CHADSVASC 4 -Patient currently not on anticoagulation per choice -Continue Cardizem, metoprolol, aspirin  Chronic pain syndrome -Continue home regimen  Essential hypertension -Continue Lasix, metoprolol, diltiazem  Chronic diastolic heart failure -Echocardiogram 02/15/2014 showed EF of 50-55% -Continue Lasix, metoprolol, aspirin  Depression/anxiety/OCD -Continue BuSpar, Prozac  Chronic normocytic anemia -Hemoglobin currently stable, at baseline of 10  Procedures: None  Consultations: None  Discharge Exam: Filed Vitals:   09/21/14 0442  BP: 143/70  Pulse: 98  Temp: 97.9 F (36.6 C)  Resp: 18     General: Well developed, well nourished, No distress  HEENT: NCAT, mucous membranes moist.  Cardiovascular: S1 S2 auscultated, irregular  Respiratory: Expiratory wheezing, occasional cough  Abdomen: Soft, nontender, nondistended, + bowel  sounds  Extremities: warm dry without cyanosis clubbing or edema  Neuro: AAOx3, nonfocal  Psych: Anxious, but pleasant  Discharge Instructions      Discharge Instructions    Discharge instructions    Complete by:  As directed   Patient will be discharged to home.  Patient will need to follow up with primary care provider within one week of discharge.  Patient should also follow-up with [name], pulmonologist, in 1 week. Patient should continue medications as prescribed.  Patient should follow a heart healthy diet.   You were cared for by a hospitalist during your hospital stay. If you have any questions about your discharge medications or the care you received while you were in the hospital after you are discharged, you can call the unit and asked to speak with the hospitalist on call if the hospitalist that took care of you is not available. Once you are discharged, your primary care physician will handle any further medical issues. Please note that NO REFILLS for any discharge medications will be authorized once you are discharged, as it is imperative that you return to your primary care physician (or establish a relationship with a primary care physician if you do not have one) for your aftercare needs so that they can reassess your need for medications and monitor your lab values.            Medication List    STOP taking these medications        amoxicillin-clavulanate 875-125 MG per tablet  Commonly known as:  AUGMENTIN     cefdinir 300 MG capsule  Commonly known as:  OMNICEF      TAKE these medications        albuterol 108 (90 BASE) MCG/ACT inhaler  Commonly known as:  PROVENTIL HFA;VENTOLIN HFA  Inhale 1-2 puffs into the lungs every 6 (six) hours as needed for wheezing.     albuterol (2.5 MG/3ML) 0.083% nebulizer solution  Commonly known as:  PROVENTIL  Take 3 mLs (2.5 mg total) by nebulization every 6 (six) hours as needed for wheezing.     budesonide-formoterol  160-4.5 MCG/ACT inhaler  Commonly known as:  SYMBICORT  Inhale 2 puffs into the lungs 2 (two) times daily.     busPIRone 10 MG tablet  Commonly known as:  BUSPAR  Take 10 mg by mouth 3 (three) times daily.     chlorpheniramine 4 MG tablet  Commonly known as:  CHLOR-TRIMETON  Take 1 tablet (4 mg total) by mouth every 8 (eight) hours as needed for allergies.     diltiazem 420 MG 24 hr capsule  Commonly known as:  TIAZAC  Take 1 capsule (420 mg total) by mouth daily.     doxycycline 100 MG tablet  Commonly known as:  VIBRA-TABS  Take 1 tablet (100 mg total) by mouth every 12 (twelve) hours.     FLUoxetine 20 MG capsule  Commonly known as:  PROZAC  TAKE THREE CAPSULES BY MOUTH ONCE DAILY     furosemide 20 MG tablet  Commonly known as:  LASIX  Take 40 mg by mouth daily.     guaiFENesin 600 MG 12 hr tablet  Commonly known as:  MUCINEX  Take 1 tablet (600 mg total) by mouth 2 (two) times daily.     hydroxypropyl methylcellulose / hypromellose 2.5 % ophthalmic solution  Commonly known as:  ISOPTO TEARS / GONIOVISC  Place 1 drop into both eyes daily as needed for dry eyes (dry eyes).     ipratropium-albuterol 0.5-2.5 (3) MG/3ML Soln  Commonly known as:  DUONEB  Take 3 mLs by nebulization 3 (three) times daily.     metoprolol succinate 25 MG 24 hr tablet  Commonly known as:  TOPROL-XL  Take 0.5 tablets (12.5 mg total) by mouth daily after lunch.     nystatin 100000 UNIT/ML suspension  Commonly known as:  MYCOSTATIN  Take 5 mLs (500,000 Units total) by mouth as needed.     oxyCODONE-acetaminophen 5-325 MG per tablet  Commonly known as:  PERCOCET/ROXICET  Take 1 tablet by mouth every 12 (twelve) hours as needed for severe pain.     pantoprazole 40 MG tablet  Commonly known as:  PROTONIX  TAKE ONE TABLET BY MOUTH TWICE DAILY     predniSONE 10 MG tablet  Commonly known as:  DELTASONE  Take 1 tablet (10 mg total) by mouth daily with breakfast. Prednisone '40mg'$  (4 tabs) x 3  days, then taper to '30mg'$  (3 tabs) x 3 days, then '20mg'$  (2 tabs) x 3days, then '10mg'$  (1 tab) x 3days, then OFF.     tiotropium 18 MCG inhalation capsule  Commonly known as:  SPIRIVA  Place 1 capsule (18 mcg total) into inhaler and inhale daily.       Allergies  Allergen Reactions  . Ace Inhibitors Cough  . Diphenhydramine Hcl Other (See Comments)    "feels like my skin is crawling, and legs twitches"  . Erythromycin Diarrhea  . Nsaids Nausea And Vomiting  . Nyquil [Pseudoeph-Doxylamine-Dm-Apap] Itching  . Tramadol Hcl Other (See Comments)    stomach pain, hallucinations  . Codeine Swelling    Face and lips swelling  Patient report Can tolerate hydrocodone  . Tomato Other (See Comments)    Stomach swells  . Orange Fruit [Citrus] Rash  . Pseudoephedrine Palpitations   Follow-up Information    Follow up with Darden Amber, PA. Schedule an appointment as soon as possible for a visit in 1 week.   Why:  Hospital follow up is on Friday the 19th at 2pm.   Contact information:   [address] [place] [address] [phone]       Follow up with Rigoberto Noel., MD. Call in 1 week.   Specialty:  Pulmonary Disease   Why:  For follow up appointment   Contact information:   [address] [place] [address] [phone]        The results of significant diagnostics from this hospitalization (including imaging, microbiology, ancillary and laboratory) are listed below for reference.    Significant Diagnostic Studies: Dg Chest 2 View  09/20/2014   CLINICAL DATA:  Shortness of breath, cough for 10 days which is worsening  EXAM: CHEST  2 VIEW  COMPARISON:  Chest x-ray of 08/29/2014  FINDINGS: No definite pneumonia or effusion is seen. There is some peribronchial thickening which may indicate bronchitis. Also minimally prominent interstitial markings are noted which may be chronic, but a viral pneumonia cannot be excluded. The heart is mildly enlarged and stable.  No acute bony abnormality is seen.  IMPRESSION: 1. Peribronchial thickening may indicate bronchitis. 2. Minimally prominent interstitial markings may be chronic, but a viral process cannot be excluded.   Electronically Signed   By: Ivar Drape M.D.   On: 09/20/2014 13:57   Dg Chest 2 View  08/29/2014   CLINICAL DATA:  Three-day history of shortness of breath with cough and wheezing  EXAM: CHEST  2 VIEW  COMPARISON:  July 19, 2014  FINDINGS: There is no edema or consolidation. The heart size  and pulmonary vascularity are normal. No adenopathy. There is lower thoracic levoscoliosis. Bones are osteoporotic.  IMPRESSION: No edema or consolidation.   Electronically Signed   By: Lowella Grip III M.D.   On: 08/29/2014 10:19    Microbiology: No results found for this or any previous visit (from the past 240 hour(s)).   Labs: Basic Metabolic Panel:  Recent Labs Lab 09/20/14 1252 09/21/14 0337  NA 142 137  K 4.3 3.5  CL 98* 96*  CO2 36* 32  GLUCOSE 114* 182*  BUN 12 18  CREATININE 0.78 0.79  CALCIUM 9.1 9.0   Liver Function Tests: No results for input(s): AST, ALT, ALKPHOS, BILITOT, PROT, ALBUMIN in the last 168 hours. No results for input(s): LIPASE, AMYLASE in the last 168 hours. No results for input(s): AMMONIA in the last 168 hours. CBC:  Recent Labs Lab 09/20/14 1300 09/21/14 0337  WBC 7.3 9.4  NEUTROABS 4.9  --   HGB 11.4* 10.2*  HCT 37.0 33.3*  MCV 99.5 96.8  PLT 332 308   Cardiac Enzymes: No results for input(s): CKTOTAL, CKMB, CKMBINDEX, TROPONINI in the last 168 hours. BNP: BNP (last 3 results)  Recent Labs  07/19/14 2056 08/29/14 0945 09/20/14 1300  BNP 287.6* 114.9* 213.5*    ProBNP (last 3 results) No results for input(s): PROBNP in the last 8760 hours.  CBG: No results for input(s): GLUCAP in the last 168 hours.     SignedCristal Ford  [facility] 09/21/2014, 10:14 AM

## 2014-09-21 NOTE — Evaluation (Signed)
Physical Therapy One Time Evaluation Patient Details Name: Kelsey Garcia MRN: [id number] DOB: 04-Jun-1945 Today's Date: 09/21/2014   History of Present Illness  69 y.o. female admitted with a history of COPD, diastolic heart failure, atrial fibrillation that presented to the emergency department with complaints of shortness of breath and diagnosed with mild COPD exacerbation  Pt with admission to [facility] in June for COPD exacerbation, acute on chronic hypoxemic respiratory failure, suspected acute bacterial bronchitis vs. atypical PNA.   Clinical Impression  Patient evaluated by Physical Therapy with no further acute PT needs identified. All education has been completed and the patient has no further questions.  See below for any follow-up Physical Therapy or equipment needs. Pt reports she was going to outpatient PT prior to pneumonia admission and has been feeling "ill and lousy" since.  Pt states she feels unable to participate in community outings with her family due to power w/c too cumbersome.  Pt only able to ambulate short distances due to dyspnea.  Recommend W/C for community mobility and HHPT to assist with weakness and endurance. PT is signing off. Thank you for this referral.     Follow Up Recommendations Home health PT    Equipment Recommendations  Wheelchair (measurements PT)    Recommendations for Other Services       Precautions / Restrictions Precautions Precautions: None Precaution Comments:  chronic 2L O2      Mobility  Bed Mobility Overal bed mobility: Modified Independent                Transfers Overall transfer level: Modified independent               General transfer comment: pt coming out of bathroom on arrival  Ambulation/Gait Ambulation/Gait assistance: Supervision Ambulation Distance (Feet): 45 Feet Assistive device: None Gait Pattern/deviations: Step-through pattern;Trunk flexed;Decreased stride length     General Gait Details: pt  pushed O2 tank, remained on baseline 2L, occasionally using rail, increased work of breathing observed and pt reports 3/4 dyspnea limiting distance  Stairs            Wheelchair Mobility    Modified Rankin (Stroke Patients Only)       Balance                                             Pertinent Vitals/Pain Pain Assessment: No/denies pain    Home Living Family/patient expects to be discharged to:: Private residence Living Arrangements: Alone Available Help at Discharge: Family;Available 24 hours/day (daughter) Type of Home: House (townhome) Home Access: Stairs to enter   [employer] of Steps: 5-6 Home Layout: Two level;Bed/bath upstairs Home Equipment: Walker - 2 wheels;Wheelchair - power;Shower seat;Grab bars - tub/shower;Bedside commode Additional Comments: power w/c not easy to transport per pt, pt mostly remains upstairs during the day    Prior Function Level of Independence: Independent with assistive device(s);Needs assistance   Gait / Transfers Assistance Needed: no assistive device in home, will use stores w/c or power carts in community if available  ADL's / Homemaking Assistance Needed: Reports she requires occasional assist for bathing/dressing "when shoulder acts up"        Hand Dominance        Extremity/Trunk Assessment               Lower Extremity Assessment: Generalized weakness  Cervical / Trunk Assessment: Kyphotic  Communication   Communication: No difficulties  Cognition Arousal/Alertness: Awake/alert Behavior During Therapy: WFL for tasks assessed/performed Overall Cognitive Status: Within Functional Limits for tasks assessed                      General Comments      Exercises        Assessment/Plan    PT Assessment All further PT needs can be met in the next venue of care  PT Diagnosis Difficulty walking;Generalized weakness   PT Problem List Decreased strength;Decreased  activity tolerance;Decreased mobility;Cardiopulmonary status limiting activity  PT Treatment Interventions     PT Goals (Current goals can be found in the Care Plan section) Acute Rehab PT Goals PT Goal Formulation: All assessment and education complete, DC therapy    Frequency     Barriers to discharge        Co-evaluation               End of Session Equipment Utilized During Treatment: Oxygen Activity Tolerance: Patient tolerated treatment well Patient left: in bed;with call bell/phone within reach Nurse Communication: Mobility status    Functional Assessment Tool Used: clinical judgement Functional Limitation: Mobility: Walking and moving around Mobility: Walking and Moving Around Current Status (W7218): At least 1 percent but less than 20 percent impaired, limited or restricted Mobility: Walking and Moving Around Goal Status [phone]): At least 1 percent but less than 20 percent impaired, limited or restricted Mobility: Walking and Moving Around Discharge Status [phone]): At least 1 percent but less than 20 percent impaired, limited or restricted    Time: 1359-1419 PT Time Calculation (min) (ACUTE ONLY): 20 min   Charges:   PT Evaluation $Initial PT Evaluation Tier I: 1 Procedure     PT G Codes:   PT G-Codes **NOT FOR INPATIENT CLASS** Functional Assessment Tool Used: clinical judgement Functional Limitation: Mobility: Walking and moving around Mobility: Walking and Moving Around Current Status (Q6047): At least 1 percent but less than 20 percent impaired, limited or restricted Mobility: Walking and Moving Around Goal Status [phone]): At least 1 percent but less than 20 percent impaired, limited or restricted Mobility: Walking and Moving Around Discharge Status [phone]): At least 1 percent but less than 20 percent impaired, limited or restricted    Kathryn Cosby,KATHrine E 09/21/2014, 2:34 PM Carmelia Bake, PT, DPT 09/21/2014 Pager: [phone]

## 2014-09-21 NOTE — Discharge Instructions (Signed)
Chronic Asthmatic Bronchitis Chronic asthmatic bronchitis is a complication of persistent asthma. After a period of time with asthma, some people develop airflow obstruction that is present all the time, even when not having an asthma attack.There is also persistent inflammation of the airways, and the bronchial tubes produce more mucus. Chronic asthmatic bronchitis usually is a permanent problem with the lungs. CAUSES  Chronic asthmatic bronchitis happens most often in people who have asthma and also smoke cigarettes. Occasionally, it can happen to a person with long-standing or severe asthma even if the person is not a smoker. SIGNS AND SYMPTOMS  Chronic asthmatic bronchitis usually causes symptoms of both asthma and chronic bronchitis, including:   Coughing.  Increased sputum production.  Wheezing and shortness of breath.  Chest discomfort.  Recurring infections. DIAGNOSIS  Your health care provider will take a medical history and perform a physical exam. Chronic asthmatic bronchitis is suspected when a person with asthma has abnormal results on breathing tests (pulmonary function tests) even when breathing symptoms are at their best. Other tests, such as a chest X-ray, may be performed to rule out other conditions.  TREATMENT  Treatment involves controlling symptoms with medicine and lifestyle changes.  Your health care provider may prescribe asthma medicines, including inhaler and nebulizer medicines.  Infection can be treated with medicine to kill germs (antibiotics). Serious infections may require hospitalization. These can include:  Pneumonia.  Sinus infections.  Acute bronchitis.   Preventing infection and hospitalization is very important. Get an influenza vaccination every year as directed by your health care provider. Ask your health care provider whether you need a pneumonia vaccine.  Ask your health care provider whether you would benefit from a pulmonary  rehabilitation program. HOME CARE INSTRUCTIONS  Take medicines only as directed by your health care provider.  If you are a cigarette smoker, the most important thing that you can do is quit. Talk to your health care provider for help with quitting smoking.  Avoid pollen, dust, animal dander, molds, smoke, and other things that cause attacks.  Regular exercise is very important to help you feel better. Discuss possible exercise routines with your health care provider.  If animal dander is the cause of asthma, you may not be able to keep pets.  It is important that you:  Become educated about your medical condition.  Participate in maintaining wellness.  Seek medical care as directed. Delay in seeking medical care could cause permanent injury and may be a risk to your life. SEEK MEDICAL CARE IF:  You have wheezing and shortness of breath even if taking medicine to prevent attacks.  You have muscle aches, chest pain, or thickening of sputum.  Your sputum changes from clear or white to yellow, green, gray, or bloody. SEEK IMMEDIATE MEDICAL CARE IF:  Your usual medicines do not stop your wheezing.  You have increased coughing or shortness of breath or both.  You have increased difficulty breathing.  You have any problems from the medicine you are taking, such as a rash, itching, swelling, or trouble breathing. MAKE SURE YOU:   Understand these instructions.  Will watch your condition.  Will get help right away if you are not doing well or get worse. Document Released: 11/13/2005 Document Revised: 06/12/2013 Document Reviewed: 03/06/2013 Exit[employer] Patient Information 2015 [employer]. This information is not intended to replace advice given to you by your health care provider. Make sure you discuss any questions you have with your health care provider.  

## 2014-09-28 ENCOUNTER — Ambulatory Visit (INDEPENDENT_AMBULATORY_CARE_PROVIDER_SITE_OTHER): Payer: Medicare Other | Admitting: Adult Health

## 2014-09-28 ENCOUNTER — Encounter: Payer: Self-pay | Admitting: Adult Health

## 2014-09-28 VITALS — BP 100/62 | HR 80 | Temp 97.6°F | Ht 65.0 in | Wt 184.0 lb

## 2014-09-28 DIAGNOSIS — J441 Chronic obstructive pulmonary disease with (acute) exacerbation: Secondary | ICD-10-CM | POA: Diagnosis not present

## 2014-09-28 DIAGNOSIS — I5032 Chronic diastolic (congestive) heart failure: Secondary | ICD-10-CM

## 2014-09-28 NOTE — Assessment & Plan Note (Signed)
Slow to resolve exacerbation  Plan  Finish Cefdinir .  Finish prednisone taper.  Mucinex DM Twice daily  As needed  Cough/congestion  Follow up [name]  4 weeks and As needed   Please contact office for sooner follow up if symptoms do not improve or worsen or seek emergency care

## 2014-09-28 NOTE — Progress Notes (Signed)
   Subjective:    Patient ID: Kelsey Garcia, female    DOB: [birth year], 69 y.o.   MRN: [id number]  HPI 84/ F, smoker presents for FU of COPD.  She has a h/o opiate abuse in the 67's. She is not prescribed codeine containing medications due to her addiction hisotry.    09/28/2014 [facility] follow up  Patient returns for a post hospital follow-up. Patient presents for a one-week follow-up from recent hospitalization He was admitted August 10 through August 12 for COPD exacerbation. He was treated with antibiotics and steroids.   . Chest x-ray showed chronic changes and bronchitis. Marland Kitchen She was discharged on a prednisone taper and doxycycline. Patient says that she did not want to take doxycycline and took her Ceftin ear that she was originally prescribed. She says that she is feeling much improved. She denies any hemoptysis, orthopnea, PND, or increased leg swelling.   Significant tests/ events PFT's 10/2012 show FEV1/FVC = 71% predicted, FEV1 = 49% predicted, DLCO 39% predicted CT 06/2014 no ILD, resolved nodular ASD CXR 07/2014 int prominence  Review of Systems neg for any significant sore throat, dysphagia, itching, sneezing, nasal congestion or excess/ purulent secretions, fever, chills, sweats, unintended wt loss, pleuritic or exertional cp, hempoptysis, orthopnea pnd or change in chronic leg swelling. Also denies presyncope, palpitations, heartburn, abdominal pain, nausea, vomiting, diarrhea or change in bowel or urinary habits, dysuria,hematuria, rash, arthralgias, visual complaints, headache, numbness weakness or ataxia.     Objective:   Physical Exam  Gen.well-nourished, in no distress, normal affect ENT - no lesions, no post nasal drip Neck: No JVD, no thyromegaly, no carotid bruits Lungs: no use of accessory muscles, no dullness to percussion, clear without rales or rhonchi  Cardiovascular: Rhythm regular, heart sounds  normal, no murmurs or gallops, trace peripheral  edema Abdomen: soft and non-tender, no hepatosplenomegaly, BS normal. Musculoskeletal: No deformities, no cyanosis or clubbing Neuro:  alert, non focal       Assessment & Plan:

## 2014-09-28 NOTE — Assessment & Plan Note (Signed)
Appears compensated without exacerbation Adrenal current regimen along with low salt diet

## 2014-09-28 NOTE — Patient Instructions (Signed)
Finish Cefdinir .  Finish prednisone taper.  Mucinex DM Twice daily  As needed  Cough/congestion  Follow up [name]  4 weeks and As needed   Please contact office for sooner follow up if symptoms do not improve or worsen or seek emergency care

## 2014-10-01 ENCOUNTER — Telehealth: Payer: Self-pay | Admitting: Cardiovascular Disease

## 2014-10-01 NOTE — Telephone Encounter (Signed)
New message      Pt states she woke up last night with a racing heart.   Pt states 114 beats this morning when walked to bathroom this morning and some SOB when she walked to bathroom and fatigue Pt states 144 beats last night Pt states chest pain, no pressure and no other signs of possible heart attack Pt states around 73 right now and takes lasix   Please call to discuss

## 2014-10-01 NOTE — Telephone Encounter (Signed)
PT  CALLED  WITH COMPLAINTS  OF  ELEVATED HEART  RATE  NOTED LAST  NIGHT    AROUND  11;00 PM AND  LASTED  INTO THE   MORNING  IS  ONLY TAKING  METOPROLOL 12.5 MG I N AFTERNOON AND  DILTIAZEM 420 MG  QD AT THIS TIME  ENCOURAGED PT  TO CONT TO  MONITOR  AND   IF NOTES INCREASE IN EPISODES AND  LASTING LONGER  TO CALL  BACK WILL FORWARD  TO DR Johnsie Cancel FOR  REVIEW .Adonis Housekeeper

## 2014-10-01 NOTE — Progress Notes (Signed)
Reviewed & agree with plan  

## 2014-10-04 ENCOUNTER — Telehealth: Payer: Self-pay | Admitting: Pulmonary Disease

## 2014-10-04 NOTE — Telephone Encounter (Signed)
Called spoke with pt. She wanted to know when she is to take her spiriva and her albuterol inhaler. I made her aware spiriva is qam and albuterol inhaler is prn only.  She needed nothing further.

## 2014-10-09 ENCOUNTER — Telehealth: Payer: Self-pay | Admitting: Pulmonary Disease

## 2014-10-09 MED ORDER — PREDNISONE 10 MG PO TABS: ORAL_TABLET | ORAL | Status: DC

## 2014-10-09 NOTE — Telephone Encounter (Signed)
Go back to 20 mg pred x 5 days, then 10 mg x 5ds Start doxy if sputum remains green/ yellow

## 2014-10-09 NOTE — Telephone Encounter (Signed)
I called spoke with pt and is aware of recs. RX for pred sent in. Nothing further needed

## 2014-10-09 NOTE — Telephone Encounter (Signed)
Called and spoke to pt. Pt c/o increase in DOE, prod cough with white to yellow mucus, chest congestion, and chest tightness when SOB (resolves with rest) x 3 days. Pt denies swelling, f/c/s. Pt states she is tapering off her pred is at '10mg'$  QD for 5 more days. Pt also states she has a full dose of doxy that she has not taken yet. Pt requesting recs from RA.   [name] please advise.

## 2014-10-16 ENCOUNTER — Emergency Department (HOSPITAL_COMMUNITY): Payer: Medicare Other

## 2014-10-16 ENCOUNTER — Emergency Department (HOSPITAL_COMMUNITY)
Admission: EM | Admit: 2014-10-16 | Discharge: 2014-10-16 | Disposition: A | Discharge status: Home / Self Care | Payer: Medicare Other | Attending: Emergency Medicine | Admitting: Emergency Medicine

## 2014-10-16 ENCOUNTER — Encounter (HOSPITAL_COMMUNITY): Payer: Self-pay | Admitting: Emergency Medicine

## 2014-10-16 DIAGNOSIS — F329 Major depressive disorder, single episode, unspecified: Secondary | ICD-10-CM | POA: Insufficient documentation

## 2014-10-16 DIAGNOSIS — G43909 Migraine, unspecified, not intractable, without status migrainosus: Secondary | ICD-10-CM | POA: Diagnosis not present

## 2014-10-16 DIAGNOSIS — Z79899 Other long term (current) drug therapy: Secondary | ICD-10-CM | POA: Diagnosis not present

## 2014-10-16 DIAGNOSIS — Z8701 Personal history of pneumonia (recurrent): Secondary | ICD-10-CM | POA: Insufficient documentation

## 2014-10-16 DIAGNOSIS — E785 Hyperlipidemia, unspecified: Secondary | ICD-10-CM | POA: Insufficient documentation

## 2014-10-16 DIAGNOSIS — Z87891 Personal history of nicotine dependence: Secondary | ICD-10-CM | POA: Insufficient documentation

## 2014-10-16 DIAGNOSIS — F419 Anxiety disorder, unspecified: Secondary | ICD-10-CM | POA: Insufficient documentation

## 2014-10-16 DIAGNOSIS — R079 Chest pain, unspecified: Secondary | ICD-10-CM

## 2014-10-16 DIAGNOSIS — I719 Aortic aneurysm of unspecified site, without rupture: Secondary | ICD-10-CM | POA: Insufficient documentation

## 2014-10-16 DIAGNOSIS — I509 Heart failure, unspecified: Secondary | ICD-10-CM | POA: Diagnosis not present

## 2014-10-16 DIAGNOSIS — I10 Essential (primary) hypertension: Secondary | ICD-10-CM | POA: Diagnosis not present

## 2014-10-16 DIAGNOSIS — R0602 Shortness of breath: Secondary | ICD-10-CM | POA: Diagnosis present

## 2014-10-16 DIAGNOSIS — K219 Gastro-esophageal reflux disease without esophagitis: Secondary | ICD-10-CM | POA: Insufficient documentation

## 2014-10-16 DIAGNOSIS — J449 Chronic obstructive pulmonary disease, unspecified: Secondary | ICD-10-CM | POA: Diagnosis not present

## 2014-10-16 LAB — CBC
HCT: 37.7 % (ref 36.0–46.0)
Hemoglobin: 11.6 g/dL — ABNORMAL LOW (ref 12.0–15.0)
MCH: 30.7 pg (ref 26.0–34.0)
MCHC: 30.8 g/dL (ref 30.0–36.0)
MCV: 99.7 fL (ref 78.0–100.0)
Platelets: 217 10*3/uL (ref 150–400)
RBC: 3.78 MIL/uL — ABNORMAL LOW (ref 3.87–5.11)
RDW: 14.7 % (ref 11.5–15.5)
WBC: 9.7 10*3/uL (ref 4.0–10.5)

## 2014-10-16 LAB — BASIC METABOLIC PANEL
Anion gap: 8 (ref 5–15)
BUN: 20 mg/dL (ref 6–20)
CALCIUM: 9.2 mg/dL (ref 8.9–10.3)
CO2: 34 mmol/L — AB (ref 22–32)
CREATININE: 0.94 mg/dL (ref 0.44–1.00)
Chloride: 97 mmol/L — ABNORMAL LOW (ref 101–111)
GFR calc non Af Amer: >60 mL/min (ref >60)
Glucose, Bld: 121 mg/dL — ABNORMAL HIGH (ref 65–99)
Potassium: 3.7 mmol/L (ref 3.5–5.1)
SODIUM: 139 mmol/L (ref 135–145)

## 2014-10-16 LAB — BRAIN NATRIURETIC PEPTIDE: B Natriuretic Peptide: 142.3 pg/mL — ABNORMAL HIGH (ref 0.0–100.0)

## 2014-10-16 LAB — I-STAT TROPONIN, ED
TROPONIN I, POC: 0.01 ng/mL (ref 0.00–0.08)
Troponin i, poc: 0 ng/mL (ref 0.00–0.08)

## 2014-10-16 MED ORDER — IOHEXOL 350 MG/ML SOLN
75.0000 mL | Freq: Once | INTRAVENOUS | Status: AC | PRN
  Administered 2014-10-16: 75 mL via INTRAVENOUS

## 2014-10-16 MED ORDER — DEXAMETHASONE 4 MG PO TABS
12.0000 mg | ORAL_TABLET | Freq: Once | ORAL | Status: AC
  Administered 2014-10-16: 12 mg via ORAL
  Filled 2014-10-16: qty 3

## 2014-10-16 MED ORDER — OXYCODONE-ACETAMINOPHEN 5-325 MG PO TABS
1.0000 | ORAL_TABLET | Freq: Once | ORAL | Status: AC
  Administered 2014-10-16: 1 via ORAL
  Filled 2014-10-16: qty 1

## 2014-10-16 NOTE — ED Notes (Signed)
Patient transported to X-ray 

## 2014-10-16 NOTE — ED Provider Notes (Signed)
CSN: [id number]     Arrival date & time 10/16/14  1332 History   First MD Initiated Contact with Patient 10/16/14 1335     Chief Complaint  Patient presents with  . Shortness of Breath     (Consider location/radiation/quality/duration/timing/severity/associated sxs/prior Treatment) HPI Comments: Chest pain, feels like a broken rib. She's had a broken rib before from coughing. Patient has chronic COPD, states it doesn't feel like an exacerbation. Worse with lying down, twisting, movements. Described as sharp, dull, aching. No radiation.  Patient is a 69 y.o. female presenting with chest pain. The history is provided by the patient.  Chest Pain Pain location:  L chest Pain quality: dull and sharp   Pain radiates to:  Does not radiate Pain radiates to the back: no   Pain severity:  Moderate Onset quality:  Gradual Duration:  4 days Timing:  Intermittent Progression:  Unchanged Chronicity:  Recurrent Context: at rest   Relieved by:  Nothing Worsened by:  Nothing tried Associated symptoms: no cough, no dizziness, no fever, no lower extremity edema and not vomiting     Past Medical History  Diagnosis Date  . Atrial fibrillation   . Hypertension   . Insomnia   . Nonischemic cardiomyopathy   . Hyperlipidemia   . Anxiety   . Cholelithiasis   . Insomnia   . Mitral regurgitation     noted 2010  . H/O epistaxis   . Rhinitis, allergic   . CHF (congestive heart failure)   . Pneumonia     "several times w/exacerbations of the COPD; nothing in the last year" (07/15/2012)  . COPD (chronic obstructive pulmonary disease)     as of 7/13 on 2-3L, pfts 10/2008 with mod obstruction  . Chronic bronchitis with COPD (chronic obstructive pulmonary disease)   . Shortness of breath     "all the time right now" (07/15/2012)  . GERD (gastroesophageal reflux disease)   . NWGNFAOZ(308.6)     "weekly" (07/15/2012)  . Migraines     "weekly for awhile; cleared up as I got older" (07/15/2012)  . DJD  (degenerative joint disease)   . Arthritis     "all over" (07/15/2012)  . OCD (obsessive compulsive disorder)   . OCD (obsessive compulsive disorder)   . Depression     h/o SI; "last time I was really serious about it was ~ 1997" (07/15/2012)   Past Surgical History  Procedure Laterality Date  . Tubal ligation  1972  . Cardioversion  2003; 07/2003   Family History  Problem Relation Age of Onset  . Asthma    . Emphysema    . Allergies    . Cancer      aunt had several types of cancer  . COPD Mother   . Emphysema Mother   . Cirrhosis Father    Social History  Substance Use Topics  . Smoking status: Former Smoker -- 0.10 packs/day for 35 years    Types: Cigarettes    Quit date: 06/18/2014  . Smokeless tobacco: Never Used     Comment: No desire to quit at this time./ 1PPD  . Alcohol Use: No   OB History    No data available     Review of Systems  Constitutional: Negative for fever.  Respiratory: Negative for cough.   Cardiovascular: Positive for chest pain.  Gastrointestinal: Negative for vomiting.  Neurological: Negative for dizziness.  All other systems reviewed and are negative.     Allergies  Ace inhibitors; Diphenhydramine  hcl; Erythromycin; Nsaids; Nyquil; Tramadol hcl; Codeine; Tomato; Orange fruit; and Pseudoephedrine  Home Medications   Prior to Admission medications   Medication Sig Start Date End Date Taking? Authorizing Provider  albuterol (PROVENTIL HFA;VENTOLIN HFA) 108 (90 BASE) MCG/ACT inhaler Inhale 1-2 puffs into the lungs every 6 (six) hours as needed for wheezing. 04/28/14   Tanna Furry, MD  albuterol (PROVENTIL) (2.5 MG/3ML) 0.083% nebulizer solution Take 3 mLs (2.5 mg total) by nebulization every 6 (six) hours as needed for wheezing. 09/21/14   Maryann Mikhail, DO  budesonide-formoterol (SYMBICORT) 160-4.5 MCG/ACT inhaler Inhale 2 puffs into the lungs 2 (two) times daily. 08/07/14   Rigoberto Noel, MD  busPIRone (BUSPAR) 10 MG tablet Take 10 mg by  mouth 3 (three) times daily. 04/22/14   Historical Provider, MD  cefdinir (OMNICEF) 300 MG capsule Take 1 capsule by mouth 2 (two) times daily. 09/17/14   Historical Provider, MD  chlorpheniramine (CHLOR-TRIMETON) 4 MG tablet Take 1 tablet (4 mg total) by mouth every 8 (eight) hours as needed for allergies. Patient taking differently: Take 4 mg by mouth every 8 (eight) hours.  11/13/13   Dellia Nims, MD  diltiazem (TIAZAC) 420 MG 24 hr capsule Take 1 capsule (420 mg total) by mouth daily. 05/09/14 08/08/15  Bartholomew Crews, MD  doxycycline (VIBRA-TABS) 100 MG tablet Take 1 tablet (100 mg total) by mouth every 12 (twelve) hours. Patient not taking: Reported on 09/28/2014 09/21/14   Velta Addison Mikhail, DO  FLUoxetine (PROZAC) 20 MG capsule TAKE THREE CAPSULES BY MOUTH ONCE DAILY Patient taking differently: Take 60 mg by mouth daily.  01/03/14   Tasrif Ahmed, MD  furosemide (LASIX) 20 MG tablet Take 40 mg by mouth daily.     Historical Provider, MD  guaiFENesin (MUCINEX) 600 MG 12 hr tablet Take 1 tablet (600 mg total) by mouth 2 (two) times daily. Patient taking differently: Take 600 mg by mouth 2 (two) times daily as needed for cough.  04/28/14   Tanna Furry, MD  hydroxypropyl methylcellulose / hypromellose (ISOPTO TEARS / GONIOVISC) 2.5 % ophthalmic solution Place 1 drop into both eyes daily as needed for dry eyes (dry eyes).     Historical Provider, MD  ipratropium-albuterol (DUONEB) 0.5-2.5 (3) MG/3ML SOLN Take 3 mLs by nebulization 3 (three) times daily. Patient not taking: Reported on 09/28/2014 09/21/14   Maryann Mikhail, DO  KLOR-CON M20 20 MEQ tablet Take 1 tablet by mouth daily. 09/26/14   Historical Provider, MD  metoprolol succinate (TOPROL-XL) 25 MG 24 hr tablet Take 0.5 tablets (12.5 mg total) by mouth daily after lunch. 07/02/14   Arnoldo Morale, MD  nystatin (MYCOSTATIN) 100000 UNIT/ML suspension Take 5 mLs (500,000 Units total) by mouth as needed. Patient taking differently: Take 500,000 Units by  mouth daily as needed (thrush).  04/16/14   Dellia Nims, MD  oxyCODONE-acetaminophen (PERCOCET/ROXICET) 5-325 MG per tablet Take 1 tablet by mouth every 12 (twelve) hours as needed for severe pain.    Historical Provider, MD  pantoprazole (PROTONIX) 40 MG tablet TAKE ONE TABLET BY MOUTH TWICE DAILY Patient taking differently: Take 40 mg by mouth 2 (two) times daily.  01/03/14   Dellia Nims, MD  predniSONE (DELTASONE) 10 MG tablet Take 1 tablet (10 mg total) by mouth daily with breakfast. Prednisone '40mg'$  (4 tabs) x 5 days, then taper to '30mg'$  (3 tabs) x 5 days, then '20mg'$  (2 tabs) x 5 days, then '10mg'$  (1 tab) x 5days, then OFF. 09/21/14   Cristal Ford, DO  predniSONE (DELTASONE) 10 MG tablet Take 20 mg daily x 5 days, then 10 mg a day x 5 days 10/09/14   Rigoberto Noel, MD  tiotropium (SPIRIVA) 18 MCG inhalation capsule Place 1 capsule (18 mcg total) into inhaler and inhale daily. 08/07/14   Rigoberto Noel, MD   BP 102/53 mmHg  Pulse 68  Resp 20  Ht '5\' 5"'$  (1.651 m)  Wt 184 lb (83.462 kg)  BMI 30.62 kg/m2  SpO2 100% Physical Exam  Constitutional: She is oriented to person, place, and time. She appears well-developed and well-nourished. No distress.  HENT:  Head: Normocephalic and atraumatic.  Mouth/Throat: Oropharynx is clear and moist.  Eyes: EOM are normal. Pupils are equal, round, and reactive to light.  Neck: Normal range of motion. Neck supple.  Cardiovascular: Normal rate and regular rhythm.  Exam reveals no friction rub.   No murmur heard. Pulmonary/Chest: Effort normal. No respiratory distress. She has wheezes. She has no rales.  Abdominal: Soft. She exhibits no distension. There is no tenderness. There is no rebound.  Musculoskeletal: Normal range of motion. She exhibits no edema.  Neurological: She is alert and oriented to person, place, and time. No cranial nerve deficit. She exhibits normal muscle tone. Coordination normal.  Skin: No rash noted. She is not diaphoretic.  Nursing note  and vitals reviewed.   ED Course  Procedures (including critical care time) Labs Review Labs Reviewed  CBC - Abnormal; Notable for the following:    RBC 3.78 (*)    Hemoglobin 11.6 (*)    All other components within normal limits  BASIC METABOLIC PANEL - Abnormal; Notable for the following:    Chloride 97 (*)    CO2 34 (*)    Glucose, Bld 121 (*)    All other components within normal limits  BRAIN NATRIURETIC PEPTIDE - Abnormal; Notable for the following:    B Natriuretic Peptide 142.3 (*)    All other components within normal limits  I-STAT TROPOININ, ED    Imaging Review Dg Chest 2 View  10/16/2014   CLINICAL DATA:  Short of breath. Chest pain. Initial encounter. Sharp left-sided chest pain and difficulty breathing. Emphysema.  EXAM: CHEST  2 VIEW  COMPARISON:  Chest radiograph 09/20/2014.  Chest CT 07/07/2014.  FINDINGS: Cardiopericardial silhouette within normal limits. Mediastinal contours normal. Trachea midline. No airspace disease or effusion. Chronic upper thoracic compression fractures are present.  IMPRESSION: No active cardiopulmonary disease.   Electronically Signed   By: Dereck Ligas M.D.   On: 10/16/2014 14:14   I have personally reviewed and evaluated these images and lab results as part of my medical decision-making.   EKG Interpretation   Date/Time:  Tuesday October 16 2014 13:57:25 EDT Ventricular Rate:  65 PR Interval:    QRS Duration: 90 QT Interval:  435 QTC Calculation: 452 R Axis:   87 Text Interpretation:  Atrial fibrillation Anterior infarct, old No  significant change since last tracing Confirmed by [facility] MD, Corene Cornea  [phone]) on 10/16/2014 4:33:00 PM      MDM   Final diagnoses:  Chest pain    48F here with chest pain. Intermittent, sharp and dull, located centrally. Worse with movements, lying down. She is worried about a rib fracture which she has had from heart coughing before. She appears older than stated age and has chronic COPD for  which she is on 2 L nasal cannula oxygen at baseline. Here she has no chest tenderness. She has some wheezing diffusely  with coughing but is breathing okay otherwise. We'll give 1 dose of Decadron. She does not feel like this is a COPD exacerbation. Denies any pressure-like chest pain that radiates to either arm. History of coronary disease.  Initial troponin ok. Will get CT PE study. Care to Dr. Dayna Barker to await PE study and 2nd troponin.  Evelina Bucy, MD 10/16/14 [phone]

## 2014-10-16 NOTE — ED Provider Notes (Signed)
Chest pain. Troponin rule out and CTA PE study. Dispo accordingly.   Second troponin negative, PE study negative no evidence of acute issues. I discussed The findings with the patient. She'll follow with her doctor for stress testing.  Merrily Pew, MD 10/17/14 [phone]

## 2014-10-16 NOTE — ED Notes (Signed)
EMS from home, intermitent CP since Friday, hx COPD/CHF, arrives on nebulizer from EMS, VSS, EKG clean, A/O X4, ambulatory and in NAD

## 2014-10-19 ENCOUNTER — Other Ambulatory Visit: Payer: Self-pay | Admitting: Pulmonary Disease

## 2014-10-19 MED ORDER — ALBUTEROL SULFATE HFA 108 (90 BASE) MCG/ACT IN AERS: 1.0000 | INHALATION_SPRAY | Freq: Four times a day (QID) | RESPIRATORY_TRACT | Status: DC | PRN

## 2014-10-19 NOTE — Telephone Encounter (Signed)
Rx sent Nothing further needed.

## 2014-11-01 ENCOUNTER — Emergency Department (HOSPITAL_COMMUNITY): Payer: Medicare Other

## 2014-11-01 ENCOUNTER — Inpatient Hospital Stay (HOSPITAL_COMMUNITY)
Admission: EM | Admit: 2014-11-01 | Discharge: 2014-11-04 | DRG: 190 | Disposition: A | Discharge status: Home / Self Care | Payer: Medicare Other | Attending: Family Medicine | Admitting: Family Medicine

## 2014-11-01 ENCOUNTER — Encounter (HOSPITAL_COMMUNITY): Payer: Self-pay

## 2014-11-01 DIAGNOSIS — Z885 Allergy status to narcotic agent status: Secondary | ICD-10-CM | POA: Diagnosis not present

## 2014-11-01 DIAGNOSIS — F419 Anxiety disorder, unspecified: Secondary | ICD-10-CM | POA: Diagnosis present

## 2014-11-01 DIAGNOSIS — I5033 Acute on chronic diastolic (congestive) heart failure: Secondary | ICD-10-CM | POA: Diagnosis present

## 2014-11-01 DIAGNOSIS — Z87891 Personal history of nicotine dependence: Secondary | ICD-10-CM | POA: Diagnosis not present

## 2014-11-01 DIAGNOSIS — F1120 Opioid dependence, uncomplicated: Secondary | ICD-10-CM | POA: Diagnosis present

## 2014-11-01 DIAGNOSIS — Z9981 Dependence on supplemental oxygen: Secondary | ICD-10-CM | POA: Diagnosis not present

## 2014-11-01 DIAGNOSIS — M954 Acquired deformity of chest and rib: Secondary | ICD-10-CM | POA: Diagnosis present

## 2014-11-01 DIAGNOSIS — G4700 Insomnia, unspecified: Secondary | ICD-10-CM | POA: Diagnosis present

## 2014-11-01 DIAGNOSIS — J9621 Acute and chronic respiratory failure with hypoxia: Secondary | ICD-10-CM | POA: Diagnosis present

## 2014-11-01 DIAGNOSIS — I480 Paroxysmal atrial fibrillation: Secondary | ICD-10-CM | POA: Diagnosis not present

## 2014-11-01 DIAGNOSIS — I10 Essential (primary) hypertension: Secondary | ICD-10-CM | POA: Diagnosis present

## 2014-11-01 DIAGNOSIS — J9622 Acute and chronic respiratory failure with hypercapnia: Secondary | ICD-10-CM | POA: Diagnosis not present

## 2014-11-01 DIAGNOSIS — M1990 Unspecified osteoarthritis, unspecified site: Secondary | ICD-10-CM | POA: Diagnosis present

## 2014-11-01 DIAGNOSIS — R52 Pain, unspecified: Secondary | ICD-10-CM

## 2014-11-01 DIAGNOSIS — Z91018 Allergy to other foods: Secondary | ICD-10-CM | POA: Diagnosis not present

## 2014-11-01 DIAGNOSIS — Z79899 Other long term (current) drug therapy: Secondary | ICD-10-CM | POA: Diagnosis not present

## 2014-11-01 DIAGNOSIS — G894 Chronic pain syndrome: Secondary | ICD-10-CM | POA: Diagnosis present

## 2014-11-01 DIAGNOSIS — M810 Age-related osteoporosis without current pathological fracture: Secondary | ICD-10-CM | POA: Diagnosis present

## 2014-11-01 DIAGNOSIS — K219 Gastro-esophageal reflux disease without esophagitis: Secondary | ICD-10-CM | POA: Diagnosis present

## 2014-11-01 DIAGNOSIS — I482 Chronic atrial fibrillation: Secondary | ICD-10-CM | POA: Diagnosis present

## 2014-11-01 DIAGNOSIS — J309 Allergic rhinitis, unspecified: Secondary | ICD-10-CM | POA: Diagnosis present

## 2014-11-01 DIAGNOSIS — S22009A Unspecified fracture of unspecified thoracic vertebra, initial encounter for closed fracture: Secondary | ICD-10-CM | POA: Diagnosis present

## 2014-11-01 DIAGNOSIS — J441 Chronic obstructive pulmonary disease with (acute) exacerbation: Secondary | ICD-10-CM | POA: Diagnosis present

## 2014-11-01 DIAGNOSIS — M4850XA Collapsed vertebra, not elsewhere classified, site unspecified, initial encounter for fracture: Secondary | ICD-10-CM | POA: Diagnosis present

## 2014-11-01 DIAGNOSIS — I4891 Unspecified atrial fibrillation: Secondary | ICD-10-CM | POA: Diagnosis present

## 2014-11-01 DIAGNOSIS — Z881 Allergy status to other antibiotic agents status: Secondary | ICD-10-CM

## 2014-11-01 DIAGNOSIS — I340 Nonrheumatic mitral (valve) insufficiency: Secondary | ICD-10-CM | POA: Diagnosis present

## 2014-11-01 DIAGNOSIS — Z888 Allergy status to other drugs, medicaments and biological substances status: Secondary | ICD-10-CM | POA: Diagnosis not present

## 2014-11-01 DIAGNOSIS — M4854XA Collapsed vertebra, not elsewhere classified, thoracic region, initial encounter for fracture: Secondary | ICD-10-CM | POA: Diagnosis present

## 2014-11-01 DIAGNOSIS — E785 Hyperlipidemia, unspecified: Secondary | ICD-10-CM | POA: Diagnosis present

## 2014-11-01 DIAGNOSIS — I429 Cardiomyopathy, unspecified: Secondary | ICD-10-CM | POA: Diagnosis present

## 2014-11-01 DIAGNOSIS — I5032 Chronic diastolic (congestive) heart failure: Secondary | ICD-10-CM | POA: Diagnosis present

## 2014-11-01 DIAGNOSIS — F42 Obsessive-compulsive disorder: Secondary | ICD-10-CM | POA: Diagnosis present

## 2014-11-01 DIAGNOSIS — F3342 Major depressive disorder, recurrent, in full remission: Secondary | ICD-10-CM | POA: Diagnosis present

## 2014-11-01 DIAGNOSIS — J9620 Acute and chronic respiratory failure, unspecified whether with hypoxia or hypercapnia: Secondary | ICD-10-CM | POA: Diagnosis present

## 2014-11-01 HISTORY — DX: Unspecified fracture of unspecified thoracic vertebra, initial encounter for closed fracture: S22.009A

## 2014-11-01 LAB — CBC WITH DIFFERENTIAL/PLATELET
BASOS ABS: 0 10*3/uL (ref 0.0–0.1)
Basophils Relative: 0 %
Eosinophils Absolute: 0.1 10*3/uL (ref 0.0–0.7)
Eosinophils Relative: 1 %
HEMATOCRIT: 37.2 % (ref 36.0–46.0)
HEMOGLOBIN: 11.3 g/dL — AB (ref 12.0–15.0)
LYMPHS PCT: 13 %
Lymphs Abs: 1.1 10*3/uL (ref 0.7–4.0)
MCH: 30 pg (ref 26.0–34.0)
MCHC: 30.4 g/dL (ref 30.0–36.0)
MCV: 98.7 fL (ref 78.0–100.0)
MONO ABS: 0.4 10*3/uL (ref 0.1–1.0)
Monocytes Relative: 5 %
NEUTROS ABS: 7.3 10*3/uL (ref 1.7–7.7)
Neutrophils Relative %: 81 %
Platelets: 269 10*3/uL (ref 150–400)
RBC: 3.77 MIL/uL — AB (ref 3.87–5.11)
RDW: 14.3 % (ref 11.5–15.5)
WBC: 9 10*3/uL (ref 4.0–10.5)

## 2014-11-01 LAB — COMPREHENSIVE METABOLIC PANEL
ALT: 13 U/L — ABNORMAL LOW (ref 14–54)
AST: 12 U/L — AB (ref 15–41)
Albumin: 2.9 g/dL — ABNORMAL LOW (ref 3.5–5.0)
Alkaline Phosphatase: 85 U/L (ref 38–126)
Anion gap: 10 (ref 5–15)
BUN: 12 mg/dL (ref 6–20)
CHLORIDE: 102 mmol/L (ref 101–111)
CO2: 31 mmol/L (ref 22–32)
Calcium: 8.9 mg/dL (ref 8.9–10.3)
Creatinine, Ser: 1.02 mg/dL — ABNORMAL HIGH (ref 0.44–1.00)
GFR calc Af Amer: >60 mL/min (ref >60)
GFR, EST NON AFRICAN AMERICAN: 56 mL/min — AB (ref >60)
Glucose, Bld: 135 mg/dL — ABNORMAL HIGH (ref 65–99)
POTASSIUM: 3.9 mmol/L (ref 3.5–5.1)
Sodium: 143 mmol/L (ref 135–145)
Total Bilirubin: 0.2 mg/dL — ABNORMAL LOW (ref 0.3–1.2)
Total Protein: 6.4 g/dL — ABNORMAL LOW (ref 6.5–8.1)

## 2014-11-01 LAB — I-STAT TROPONIN, ED: Troponin i, poc: 0 ng/mL (ref 0.00–0.08)

## 2014-11-01 LAB — I-STAT ARTERIAL BLOOD GAS, ED
Acid-Base Excess: 7 mmol/L — ABNORMAL HIGH (ref 0.0–2.0)
Bicarbonate: 33.7 mEq/L — ABNORMAL HIGH (ref 20.0–24.0)
O2 Saturation: 85 %
PCO2 ART: 55.9 mmHg — AB (ref 35.0–45.0)
PH ART: 7.387 (ref 7.350–7.450)
Patient temperature: 98.4
TCO2: 35 mmol/L (ref 0–100)
pO2, Arterial: 52 mmHg — ABNORMAL LOW (ref 80.0–100.0)

## 2014-11-01 LAB — LIPASE, BLOOD: LIPASE: 13 U/L — AB (ref 22–51)

## 2014-11-01 LAB — BRAIN NATRIURETIC PEPTIDE: B NATRIURETIC PEPTIDE 5: 229.4 pg/mL — AB (ref 0.0–100.0)

## 2014-11-01 LAB — TROPONIN I

## 2014-11-01 MED ORDER — FUROSEMIDE 10 MG/ML IJ SOLN
40.0000 mg | Freq: Two times a day (BID) | INTRAMUSCULAR | Status: AC
  Administered 2014-11-01 – 2014-11-02 (×2): 40 mg via INTRAVENOUS
  Filled 2014-11-01 (×2): qty 4

## 2014-11-01 MED ORDER — IPRATROPIUM-ALBUTEROL 0.5-2.5 (3) MG/3ML IN SOLN
3.0000 mL | RESPIRATORY_TRACT | Status: DC
  Administered 2014-11-01: 3 mL via RESPIRATORY_TRACT

## 2014-11-01 MED ORDER — METHYLPREDNISOLONE SODIUM SUCC 125 MG IJ SOLR
125.0000 mg | Freq: Once | INTRAMUSCULAR | Status: AC
  Administered 2014-11-01: 125 mg via INTRAVENOUS
  Filled 2014-11-01: qty 2

## 2014-11-01 MED ORDER — BUDESONIDE-FORMOTEROL FUMARATE 160-4.5 MCG/ACT IN AERO
2.0000 | INHALATION_SPRAY | Freq: Two times a day (BID) | RESPIRATORY_TRACT | Status: DC
  Administered 2014-11-01 – 2014-11-04 (×6): 2 via RESPIRATORY_TRACT
  Filled 2014-11-01: qty 6

## 2014-11-01 MED ORDER — PREDNISONE 20 MG PO TABS
40.0000 mg | ORAL_TABLET | Freq: Every day | ORAL | Status: DC
  Administered 2014-11-03: 40 mg via ORAL
  Filled 2014-11-01: qty 2

## 2014-11-01 MED ORDER — ENOXAPARIN SODIUM 40 MG/0.4ML ~~LOC~~ SOLN
40.0000 mg | SUBCUTANEOUS | Status: DC
  Administered 2014-11-01 – 2014-11-03 (×3): 40 mg via SUBCUTANEOUS
  Filled 2014-11-01 (×3): qty 0.4

## 2014-11-01 MED ORDER — BUSPIRONE HCL 10 MG PO TABS
10.0000 mg | ORAL_TABLET | Freq: Three times a day (TID) | ORAL | Status: DC
  Administered 2014-11-01 – 2014-11-04 (×8): 10 mg via ORAL
  Filled 2014-11-01 (×8): qty 1

## 2014-11-01 MED ORDER — FLUOXETINE HCL 20 MG PO CAPS
60.0000 mg | ORAL_CAPSULE | Freq: Every day | ORAL | Status: DC
  Administered 2014-11-02 – 2014-11-04 (×3): 60 mg via ORAL
  Filled 2014-11-01 (×3): qty 3

## 2014-11-01 MED ORDER — OXYCODONE-ACETAMINOPHEN 5-325 MG PO TABS
1.0000 | ORAL_TABLET | Freq: Two times a day (BID) | ORAL | Status: DC | PRN
Start: 2014-11-01 — End: 2014-11-02
  Administered 2014-11-01: 1 via ORAL
  Filled 2014-11-01: qty 1

## 2014-11-01 MED ORDER — DILTIAZEM HCL ER BEADS 300 MG PO CP24
420.0000 mg | ORAL_CAPSULE | Freq: Every day | ORAL | Status: DC
  Administered 2014-11-02 – 2014-11-04 (×3): 420 mg via ORAL
  Filled 2014-11-01 (×4): qty 1

## 2014-11-01 MED ORDER — GUAIFENESIN ER 600 MG PO TB12: 600.0000 mg | ORAL_TABLET | Freq: Two times a day (BID) | ORAL | Status: DC | PRN

## 2014-11-01 MED ORDER — IPRATROPIUM-ALBUTEROL 0.5-2.5 (3) MG/3ML IN SOLN
3.0000 mL | Freq: Three times a day (TID) | RESPIRATORY_TRACT | Status: DC
  Administered 2014-11-02 – 2014-11-04 (×7): 3 mL via RESPIRATORY_TRACT
  Filled 2014-11-01 (×7): qty 3

## 2014-11-01 MED ORDER — POTASSIUM CHLORIDE CRYS ER 20 MEQ PO TBCR
20.0000 meq | EXTENDED_RELEASE_TABLET | Freq: Every day | ORAL | Status: DC
  Administered 2014-11-02 – 2014-11-04 (×3): 20 meq via ORAL
  Filled 2014-11-01 (×3): qty 1

## 2014-11-01 MED ORDER — HYPROMELLOSE (GONIOSCOPIC) 2.5 % OP SOLN
1.0000 [drp] | Freq: Every day | OPHTHALMIC | Status: DC | PRN
  Filled 2014-11-01: qty 15

## 2014-11-01 MED ORDER — TIOTROPIUM BROMIDE MONOHYDRATE 18 MCG IN CAPS
18.0000 ug | ORAL_CAPSULE | Freq: Every day | RESPIRATORY_TRACT | Status: DC
  Filled 2014-11-01: qty 5

## 2014-11-01 MED ORDER — IPRATROPIUM-ALBUTEROL 0.5-2.5 (3) MG/3ML IN SOLN
3.0000 mL | RESPIRATORY_TRACT | Status: AC
  Administered 2014-11-01 (×3): 3 mL via RESPIRATORY_TRACT
  Filled 2014-11-01 (×3): qty 3

## 2014-11-01 MED ORDER — PREDNISONE 20 MG PO TABS
20.0000 mg | ORAL_TABLET | Freq: Every day | ORAL | Status: DC
  Administered 2014-11-01: 20 mg via ORAL
  Filled 2014-11-01: qty 1

## 2014-11-01 MED ORDER — OXYCODONE-ACETAMINOPHEN 5-325 MG PO TABS
1.0000 | ORAL_TABLET | Freq: Once | ORAL | Status: AC
  Administered 2014-11-01: 1 via ORAL
  Filled 2014-11-01: qty 1

## 2014-11-01 MED ORDER — BENZONATATE 100 MG PO CAPS: 200.0000 mg | ORAL_CAPSULE | Freq: Three times a day (TID) | ORAL | Status: DC | PRN

## 2014-11-01 MED ORDER — METOPROLOL SUCCINATE ER 25 MG PO TB24
12.5000 mg | ORAL_TABLET | Freq: Every day | ORAL | Status: DC
  Administered 2014-11-02 – 2014-11-04 (×3): 12.5 mg via ORAL
  Filled 2014-11-01 (×3): qty 1

## 2014-11-01 MED ORDER — ALBUTEROL SULFATE (2.5 MG/3ML) 0.083% IN NEBU: 2.5000 mg | INHALATION_SOLUTION | RESPIRATORY_TRACT | Status: DC | PRN

## 2014-11-01 MED ORDER — PANTOPRAZOLE SODIUM 40 MG PO TBEC
40.0000 mg | DELAYED_RELEASE_TABLET | Freq: Two times a day (BID) | ORAL | Status: DC
  Administered 2014-11-01 – 2014-11-04 (×6): 40 mg via ORAL
  Filled 2014-11-01 (×6): qty 1

## 2014-11-01 MED ORDER — INFLUENZA VAC SPLIT QUAD 0.5 ML IM SUSY: 0.5000 mL | PREFILLED_SYRINGE | INTRAMUSCULAR | Status: DC | PRN

## 2014-11-01 NOTE — ED Provider Notes (Signed)
CSN: [id number]     Arrival date & time 11/01/14  1417 History   None    Chief Complaint  Patient presents with  . Shortness of Breath   Patient is a 69 y.o. female presenting with general illness. The history is provided by the patient. No language interpreter was used.  Illness Location:  Chest Quality:  Pain, SOB, wheeze Severity:  Severe Onset quality:  Gradual Timing:  Intermittent Progression:  Waxing and waning Chronicity:  Recurrent Context:  PMHx of HTN, HLD, A-fib (taking Coumadin), NICM, HFpEF (50-55% 2016), COPD (2L [place] 24/7), & anxiety presenting with CP & SOB. Onset this afternoon. Associated with cough& wheezing. EMS called and noted wheezing in all lung fields & tachypnea. Albuterol, Atrovent, & Solumedrol given. 95% on NRB. BP 90's/60's. Of note, patient seen in our ED on 10/16/14 (x2 weeks ago) for CP/SOB & had normal EKG, (-) delta troponin, & CT angio showing thoracic anneurysm but no PE, pleural effusion, PTX, or consolidation. Associated symptoms: abdominal pain (epigastric), chest pain, cough, shortness of breath and wheezing   Associated symptoms: no diarrhea, no fever, no loss of consciousness, no nausea and no vomiting     Past Medical History  Diagnosis Date  . Atrial fibrillation   . Hypertension   . Insomnia   . Nonischemic cardiomyopathy   . Hyperlipidemia   . Anxiety   . Cholelithiasis   . Insomnia   . Mitral regurgitation     noted 2010  . H/O epistaxis   . Rhinitis, allergic   . CHF (congestive heart failure)   . Pneumonia     "several times w/exacerbations of the COPD; nothing in the last year" (07/15/2012)  . COPD (chronic obstructive pulmonary disease)     as of 7/13 on 2-3L, pfts 10/2008 with mod obstruction  . Chronic bronchitis with COPD (chronic obstructive pulmonary disease)   . Shortness of breath     "all the time right now" (07/15/2012)  . GERD (gastroesophageal reflux disease)   . HYWVPXTG(626.9)     "weekly" (07/15/2012)  . Migraines      "weekly for awhile; cleared up as I got older" (07/15/2012)  . DJD (degenerative joint disease)   . Arthritis     "all over" (07/15/2012)  . OCD (obsessive compulsive disorder)   . OCD (obsessive compulsive disorder)   . Depression     h/o SI; "last time I was really serious about it was ~ 1997" (07/15/2012)   Past Surgical History  Procedure Laterality Date  . Tubal ligation  1972  . Cardioversion  2003; 07/2003   Family History  Problem Relation Age of Onset  . Asthma    . Emphysema    . Allergies    . Cancer      aunt had several types of cancer  . COPD Mother   . Emphysema Mother   . Cirrhosis Father    Social History  Substance Use Topics  . Smoking status: Former Smoker -- 0.10 packs/day for 35 years    Types: Cigarettes    Quit date: 06/18/2014  . Smokeless tobacco: Never Used     Comment: No desire to quit at this time./ 1PPD  . Alcohol Use: No   OB History    No data available      Review of Systems  Constitutional: Negative for fever and chills.  Respiratory: Positive for cough, shortness of breath and wheezing.   Cardiovascular: Positive for chest pain and leg swelling (chronic). Negative  for palpitations.  Gastrointestinal: Positive for abdominal pain (epigastric). Negative for nausea, vomiting and diarrhea.  Neurological: Negative for loss of consciousness.  All other systems reviewed and are negative.   Allergies  Ace inhibitors; Diphenhydramine hcl; Erythromycin; Nsaids; Nyquil; Tramadol hcl; Codeine; Tomato; Orange fruit; and Pseudoephedrine  Home Medications   Prior to Admission medications   Medication Sig Start Date End Date Taking? Authorizing Provider  albuterol (PROVENTIL HFA;VENTOLIN HFA) 108 (90 BASE) MCG/ACT inhaler Inhale 1-2 puffs into the lungs every 6 (six) hours as needed for wheezing. 10/19/14  Yes Rigoberto Noel, MD  albuterol (PROVENTIL) (2.5 MG/3ML) 0.083% nebulizer solution Take 3 mLs (2.5 mg total) by nebulization every 6 (six)  hours as needed for wheezing. 09/21/14  Yes Maryann Mikhail, DO  budesonide-formoterol (SYMBICORT) 160-4.5 MCG/ACT inhaler Inhale 2 puffs into the lungs 2 (two) times daily. 08/07/14  Yes Rigoberto Noel, MD  busPIRone (BUSPAR) 10 MG tablet Take 10 mg by mouth 3 (three) times daily. 04/22/14  Yes Historical Provider, MD  chlorpheniramine (CHLOR-TRIMETON) 4 MG tablet Take 1 tablet (4 mg total) by mouth every 8 (eight) hours as needed for allergies. Patient taking differently: Take 4 mg by mouth daily.  11/13/13  Yes Tasrif Ahmed, MD  diltiazem (TIAZAC) 420 MG 24 hr capsule Take 1 capsule (420 mg total) by mouth daily. 05/09/14 08/08/15 Yes Bartholomew Crews, MD  FLUoxetine (PROZAC) 20 MG capsule TAKE THREE CAPSULES BY MOUTH ONCE DAILY Patient taking differently: Take 60 mg by mouth daily.  01/03/14  Yes Tasrif Ahmed, MD  furosemide (LASIX) 20 MG tablet Take 40 mg by mouth daily.    Yes Historical Provider, MD  guaiFENesin (MUCINEX) 600 MG 12 hr tablet Take 1 tablet (600 mg total) by mouth 2 (two) times daily. Patient taking differently: Take 600 mg by mouth 2 (two) times daily as needed for cough.  04/28/14  Yes Tanna Furry, MD  hydroxypropyl methylcellulose / hypromellose (ISOPTO TEARS / GONIOVISC) 2.5 % ophthalmic solution Place 1 drop into both eyes daily as needed for dry eyes (dry eyes).    Yes Historical Provider, MD  KLOR-CON M20 20 MEQ tablet Take 1 tablet by mouth daily. 09/26/14  Yes Historical Provider, MD  metoprolol succinate (TOPROL-XL) 25 MG 24 hr tablet Take 0.5 tablets (12.5 mg total) by mouth daily after lunch. 07/02/14  Yes Arnoldo Morale, MD  nystatin (MYCOSTATIN) 100000 UNIT/ML suspension Take 5 mLs (500,000 Units total) by mouth as needed. Patient taking differently: Take 500,000 Units by mouth daily as needed (thrush).  04/16/14  Yes Tasrif Ahmed, MD  oxyCODONE-acetaminophen (PERCOCET/ROXICET) 5-325 MG per tablet Take 1 tablet by mouth every 12 (twelve) hours as needed for severe pain.   Yes  Historical Provider, MD  pantoprazole (PROTONIX) 40 MG tablet TAKE ONE TABLET BY MOUTH TWICE DAILY Patient taking differently: Take 40 mg by mouth 2 (two) times daily.  01/03/14  Yes Tasrif Ahmed, MD  tiotropium (SPIRIVA) 18 MCG inhalation capsule Place 1 capsule (18 mcg total) into inhaler and inhale daily. 08/07/14  Yes Rigoberto Noel, MD  doxycycline (VIBRA-TABS) 100 MG tablet Take 1 tablet (100 mg total) by mouth every 12 (twelve) hours. Patient not taking: Reported on 09/28/2014 09/21/14   Maryann Mikhail, DO  ipratropium-albuterol (DUONEB) 0.5-2.5 (3) MG/3ML SOLN Take 3 mLs by nebulization 3 (three) times daily. Patient not taking: Reported on 09/28/2014 09/21/14   Velta Addison Mikhail, DO  predniSONE (DELTASONE) 10 MG tablet Take 1 tablet (10 mg total) by mouth daily with  breakfast. Prednisone '40mg'$  (4 tabs) x 5 days, then taper to '30mg'$  (3 tabs) x 5 days, then '20mg'$  (2 tabs) x 5 days, then '10mg'$  (1 tab) x 5days, then OFF. Patient not taking: Reported on 11/01/2014 09/21/14   Velta Addison Mikhail, DO  predniSONE (DELTASONE) 10 MG tablet Take 20 mg daily x 5 days, then 10 mg a day x 5 days Patient not taking: Reported on 11/01/2014 10/09/14   Rigoberto Noel, MD   BP 106/87 mmHg  Pulse 79  Temp(Src) 98.4 F (36.9 C) (Oral)  Resp 20  Ht '5\' 5"'$  (1.651 m)  Wt 176 lb (79.833 kg)  BMI 29.29 kg/m2  SpO2 100%   Physical Exam  Constitutional: She is oriented to person, place, and time. She appears distressed.  Elderly overweight female lying in stretcher in mild to moderate respiratory distress  HENT:  Head: Normocephalic and atraumatic.  Eyes: Conjunctivae are normal. Pupils are equal, round, and reactive to light.  Neck: Normal range of motion. Neck supple.  Cardiovascular: Normal rate, regular rhythm and intact distal pulses.   Pulmonary/Chest: She is in respiratory distress. She has wheezes. She exhibits tenderness.  Tachypneic to low 20's and maintaining saturations on NRB with nebulizer. Decreased air  movement with mild end expiratory wheeze.  Abdominal: Soft. Bowel sounds are normal. She exhibits no distension. There is tenderness (all upper quadrants worse in epigastrum). There is no rebound and no guarding.  Musculoskeletal: Normal range of motion.  Neurological: She is alert and oriented to person, place, and time.  Skin: Skin is warm and dry. She is not diaphoretic.  Nursing note and vitals reviewed.   ED Course  Procedures   Labs Review Labs Reviewed  COMPREHENSIVE METABOLIC PANEL - Abnormal; Notable for the following:    Glucose, Bld 135 (*)    Creatinine, Ser 1.02 (*)    Total Protein 6.4 (*)    Albumin 2.9 (*)    AST 12 (*)    ALT 13 (*)    Total Bilirubin 0.2 (*)    GFR calc non Af Amer 56 (*)    All other components within normal limits  LIPASE, BLOOD - Abnormal; Notable for the following:    Lipase 13 (*)    All other components within normal limits  CBC WITH DIFFERENTIAL/PLATELET - Abnormal; Notable for the following:    RBC 3.77 (*)    Hemoglobin 11.3 (*)    All other components within normal limits  BRAIN NATRIURETIC PEPTIDE - Abnormal; Notable for the following:    B Natriuretic Peptide 229.4 (*)    All other components within normal limits  I-STAT TROPOININ, ED   Imaging Review Dg Chest Portable 1 View  11/01/2014   CLINICAL DATA:  Shortness of breath for 1 day  EXAM: PORTABLE CHEST 1 VIEW  COMPARISON:  October 16, 2014  FINDINGS: The heart size and mediastinal contours are stable. There is chronic elevated right hemidiaphragm. The heart size is enlarged. The aorta is tortuous. There is no focal infiltrate, pulmonary edema, or pleural effusion. The visualized skeletal structures are unremarkable.  IMPRESSION: No active cardiopulmonary disease.   Electronically Signed   By: Abelardo Diesel M.D.   On: 11/01/2014 16:39   I have personally reviewed and evaluated these images and lab results as part of my medical decision-making.   EKG  Interpretation   Date/Time:  Thursday November 01 2014 14:28:32 EDT Ventricular Rate:  60 PR Interval:    QRS Duration: 90 QT Interval:  419  QTC Calculation: 419 R Axis:   59 Text Interpretation:  Atrial fibrillation Low voltage, precordial leads  Probable anteroseptal infarct, old No significant change since last  tracing no acute ischemia Confirmed by Gerald Leitz ([id number]) on  11/01/2014 2:39:42 PM      MDM  Ms. Herbold is a 69 yo female w/ PMHx of HTN, HLD, A-fib, NICM, HFpEF (50-55% 2016), COPD (2L [place] 24/7), & anxiety presenting with CP & SOB. Onset this afternoon. Associated with cough& wheezing. EMS called and noted wheezing in all lung fields & tachypnea. Albuterol, Atrovent, & Solumedrol given. 95% on NRB. BP 90's/60's. 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  Elderly overweight female lying in stretcher in mild to moderate respiratory distres. Afebrile. Not tachycardic. Normotensive. Tachypneic to low 20's and maintaining saturations on NRB with nebulizer. Decreased air movement with mild end expiratory wheeze. Abdomen showing tenderness to palpation of upper quadrants worse in the epigastrium without guarding or rebound. CV RRR. Remainder of examination unremarkable.  EKG showing no ST elevation or depression or obvious T-wave changes. I-STAT troponin undetectable. WBC 9 0.0. Hemoglobin 11.3. CMP relatively unremarkable. Lipase 13. BNP 229. Chest x-ray showing no acute called pulmonary disease. Laboratory and imaging results were personally reviewed by myself and used in the medical decision making of this patient's treatment and disposition.  Patient given an additional 3 DuoNebs back to back with minimal improvement in symptoms. Patient still mildly  tachypneic with increased work of breathing and maintaining saturations on 3-4L NRB.  Patient admitted to family medicine service for further evaluation and management of COPD exacerbation. Pt understands and agrees with the plan and has no further questions or concerns.   Pt care discussed with and followed by my attending, Dr. Donnald Garre, MD Pager [phone]  Final diagnoses:  COPD exacerbation    Mayer Camel, MD 11/01/14 0347  Evelina Bucy, MD 11/01/14 2325
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
## 2014-11-01 NOTE — ED Notes (Signed)
RT at the bedside.

## 2014-11-01 NOTE — ED Notes (Signed)
Hospitalist at the bedside 

## 2014-11-01 NOTE — ED Notes (Signed)
MD Mingo Amber at the bedside.

## 2014-11-01 NOTE — Progress Notes (Signed)
ABG results given to MD, no new RT orders received.

## 2014-11-01 NOTE — ED Notes (Signed)
Called Respiratory to get ABG.

## 2014-11-01 NOTE — ED Notes (Signed)
Per EMS, Patient is coming from home with complaint of Chest pain and SOB. Patient reports starting this morning at 0500, Chronic O2 and home nebulizer used with no relief. Accessory muscle use and Tachypnea at rest upon EMS arrival with wheezing noted in all lobes, predominately on the right. Afib on Monitor with Hx of the same. Given 5 mg of Albulterol, 0.5 Atrovent, and 125 mg of Solumedrol. 20 Gauge in Left AC. 97% on Neb, 93/64, 61 HR. Hx of COPD.

## 2014-11-01 NOTE — ED Notes (Signed)
X-Ray at the bedside 

## 2014-11-02 ENCOUNTER — Inpatient Hospital Stay (HOSPITAL_COMMUNITY): Payer: Medicare Other

## 2014-11-02 DIAGNOSIS — I10 Essential (primary) hypertension: Secondary | ICD-10-CM

## 2014-11-02 DIAGNOSIS — I5032 Chronic diastolic (congestive) heart failure: Secondary | ICD-10-CM

## 2014-11-02 DIAGNOSIS — J9622 Acute and chronic respiratory failure with hypercapnia: Secondary | ICD-10-CM

## 2014-11-02 DIAGNOSIS — E785 Hyperlipidemia, unspecified: Secondary | ICD-10-CM

## 2014-11-02 DIAGNOSIS — J441 Chronic obstructive pulmonary disease with (acute) exacerbation: Principal | ICD-10-CM

## 2014-11-02 LAB — BASIC METABOLIC PANEL
Anion gap: 9 (ref 5–15)
BUN: 15 mg/dL (ref 6–20)
CHLORIDE: 97 mmol/L — AB (ref 101–111)
CO2: 30 mmol/L (ref 22–32)
Calcium: 8.8 mg/dL — ABNORMAL LOW (ref 8.9–10.3)
Creatinine, Ser: 0.91 mg/dL (ref 0.44–1.00)
GFR calc Af Amer: >60 mL/min (ref >60)
GFR calc non Af Amer: >60 mL/min (ref >60)
GLUCOSE: 143 mg/dL — AB (ref 65–99)
POTASSIUM: 3.7 mmol/L (ref 3.5–5.1)
Sodium: 136 mmol/L (ref 135–145)

## 2014-11-02 LAB — CBC
HEMATOCRIT: 35.9 % — AB (ref 36.0–46.0)
Hemoglobin: 11.3 g/dL — ABNORMAL LOW (ref 12.0–15.0)
MCH: 30.7 pg (ref 26.0–34.0)
MCHC: 31.5 g/dL (ref 30.0–36.0)
MCV: 97.6 fL (ref 78.0–100.0)
Platelets: 266 10*3/uL (ref 150–400)
RBC: 3.68 MIL/uL — ABNORMAL LOW (ref 3.87–5.11)
RDW: 14.1 % (ref 11.5–15.5)
WBC: 8.3 10*3/uL (ref 4.0–10.5)

## 2014-11-02 LAB — TROPONIN I
Troponin I: <0.03 ng/mL (ref <0.031)
Troponin I: <0.03 ng/mL (ref <0.031)

## 2014-11-02 MED ORDER — OXYCODONE-ACETAMINOPHEN 5-325 MG PO TABS
1.0000 | ORAL_TABLET | Freq: Three times a day (TID) | ORAL | Status: DC | PRN
  Administered 2014-11-02 – 2014-11-04 (×7): 1 via ORAL
  Filled 2014-11-02 (×7): qty 1

## 2014-11-02 MED ORDER — PHENOL 1.4 % MT LIQD: 1.0000 | OROMUCOSAL | Status: DC | PRN

## 2014-11-02 MED ORDER — FUROSEMIDE 40 MG PO TABS
40.0000 mg | ORAL_TABLET | Freq: Every day | ORAL | Status: DC
  Administered 2014-11-02 – 2014-11-04 (×3): 40 mg via ORAL
  Filled 2014-11-02 (×3): qty 1

## 2014-11-02 NOTE — Progress Notes (Signed)
Utilization review completed. Bertha Stanfill, RN, BSN. 

## 2014-11-02 NOTE — Discharge Summary (Signed)
Physician Discharge Summary  Patient ID: Kelsey Garcia MRN: [id number] DOB/AGE: 1945-08-11 69 y.o.  Admit date: 11/01/2014 Discharge date: 11/02/2014  Admission Diagnoses: COPD exacerbation   Discharge Diagnoses:  Patient Active Problem List   Diagnosis Date Noted  . Thoracic vertebral fracture 11/03/2014  . Acute on chronic diastolic heart failure   . Acute on chronic respiratory failure 11/01/2014  . Major depressive disorder, recurrent, in full remission with anxious distress 07/23/2014  . Narcotic addiction 07/12/2014  . Narcotic dependence 07/12/2014  . Generalized abdominal pain 07/05/2014  . Chronic pain syndrome 07/02/2014  . COPD exacerbation 06/19/2014  . Atrial fibrillation 06/19/2014  . Pancytopenia 05/15/2014  . Fracture of rib of left side with delayed healing 05/15/2014  . Respiratory failure, chronic 12/20/2013  . Neck pain on right side 12/18/2013  . Chronic diastolic CHF (congestive heart failure) 10/12/2013  . Osteoporosis 11/29/2012  . Easy bruising 10/01/2012  . Oral thrush 09/24/2012  . Other screening mammogram 12/25/2011  . Screening for colon cancer 12/25/2011  . Impaired mobility 11/26/2011  . 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 Long term current use of opiate analgesic 10/30/2011  . GERD (gastroesophageal reflux disease) 10/30/2011  . Chronic cough 08/28/2011  . Elevated MCV 08/28/2011  . Financial difficulties 04/22/2011  . Preventative health care 10/30/2010  . Anxiety 10/30/2010  . HLD (hyperlipidemia) 05/24/2009  . NICOTINE ADDICTION 01/07/2009  . Osteoarthritis 11/28/2008  . COPD (chronic obstructive pulmonary disease) 03/28/2008  . OBSESSIVE-COMPULSIVE DISORDER 12/29/2005  . DEPRESSION 12/29/2005  . Essential hypertension 12/29/2005  . CARDIOMYOPATHY 12/29/2005  . Atrial fibrillation with rapid ventricular response 12/29/2005  . CHOLELITHIASIS 12/29/2005  . INSOMNIA 12/29/2005    Hospital Course: Kelsey Garcia is a 69 y.o. female with a  history of NICM, HFpEF, permanent AFib not on anticoagulation, 2L-dependent COPD, former smoker, HTN, HLD, chronic pain and OCD presenting for cough and shortness of breath worsening over 3 days. Based on clinical picture, suspect COPD exacerbation more than CHF. Patient remained afebrile, CXR without consolidation or significant pulmonary edema or infiltrates. In addition to continuing respiratory treatment regimen, patient was treated here with Duonebs and started on a steroid course that consisted of solumedrol '125mg'$  IV x1, complete 5 day course with prednisone '40mg'$  daily x 4 days. Patient initially required 6L O2 by face mask in the ED but was quickly weaned to home 2L [place] where she remained comfortably for the rest of the hospitalization. Of note, as patient had 4 admissions in the past 6 months for exacerbation during which significant antibiotic courses were given, antibiotics were withheld this admission. Has pulmonology follow up next week. Pulmonology to do: Consider repeat PFTs when stable.  Chronic HFpEF CHF was likely contributing to hypoxemia with crackles and LE edema on exam, equivocally elevated BNP to 229. Last echo in January with preserved EF (28-76%) and diastolic parameter interpretation limited by AFib. IV Lasix x2 was given in place of home oral. ACS r/o troponin x3 negative, ECG showing stable AFib and no evidence of ischemia. Rate well controlled on metoprolol and diltiazem. s/p attempted cardioversion 2003 and 2005. CHADS2-VASc = 4 but patient reports extensive education from her outpatient cardiologist and refuses anticoagulation. Reports taking ASA daily though this has not been prescribed. TSH in May was 0.437. Cardiology to do: Consider repeat echo. Review need for ASA. PCP: consider TSH.   Disposition: 01-Home or Self Care     Medication List    ASK your doctor about these medications  albuterol (2.5 MG/3ML) 0.083% nebulizer solution  Commonly known as:  PROVENTIL   Take 3 mLs (2.5 mg total) by nebulization every 6 (six) hours as needed for wheezing.     albuterol 108 (90 BASE) MCG/ACT inhaler  Commonly known as:  PROVENTIL HFA;VENTOLIN HFA  Inhale 1-2 puffs into the lungs every 6 (six) hours as needed for wheezing.     budesonide-formoterol 160-4.5 MCG/ACT inhaler  Commonly known as:  SYMBICORT  Inhale 2 puffs into the lungs 2 (two) times daily.     busPIRone 10 MG tablet  Commonly known as:  BUSPAR  Take 10 mg by mouth 3 (three) times daily.     chlorpheniramine 4 MG tablet  Commonly known as:  CHLOR-TRIMETON  Take 1 tablet (4 mg total) by mouth every 8 (eight) hours as needed for allergies.     diltiazem 420 MG 24 hr capsule  Commonly known as:  TIAZAC  Take 1 capsule (420 mg total) by mouth daily.     doxycycline 100 MG tablet  Commonly known as:  VIBRA-TABS  Take 1 tablet (100 mg total) by mouth every 12 (twelve) hours.     FLUoxetine 20 MG capsule  Commonly known as:  PROZAC  TAKE THREE CAPSULES BY MOUTH ONCE DAILY     furosemide 20 MG tablet  Commonly known as:  LASIX  Take 40 mg by mouth daily.     guaiFENesin 600 MG 12 hr tablet  Commonly known as:  MUCINEX  Take 1 tablet (600 mg total) by mouth 2 (two) times daily.     hydroxypropyl methylcellulose / hypromellose 2.5 % ophthalmic solution  Commonly known as:  ISOPTO TEARS / GONIOVISC  Place 1 drop into both eyes daily as needed for dry eyes (dry eyes).     ipratropium-albuterol 0.5-2.5 (3) MG/3ML Soln  Commonly known as:  DUONEB  Take 3 mLs by nebulization 3 (three) times daily.     KLOR-CON M20 20 MEQ tablet  Generic drug:  potassium chloride SA  Take 1 tablet by mouth daily.     metoprolol succinate 25 MG 24 hr tablet  Commonly known as:  TOPROL-XL  Take 0.5 tablets (12.5 mg total) by mouth daily after lunch.     nystatin 100000 UNIT/ML suspension  Commonly known as:  MYCOSTATIN  Take 5 mLs (500,000 Units total) by mouth as needed.      oxyCODONE-acetaminophen 5-325 MG per tablet  Commonly known as:  PERCOCET/ROXICET  Take 1 tablet by mouth every 12 (twelve) hours as needed for severe pain.     pantoprazole 40 MG tablet  Commonly known as:  PROTONIX  TAKE ONE TABLET BY MOUTH TWICE DAILY     predniSONE 10 MG tablet  Commonly known as:  DELTASONE  Take 1 tablet (10 mg total) by mouth daily with breakfast. Prednisone '40mg'$  (4 tabs) x 5 days, then taper to '30mg'$  (3 tabs) x 5 days, then '20mg'$  (2 tabs) x 5 days, then '10mg'$  (1 tab) x 5days, then OFF.     predniSONE 10 MG tablet  Commonly known as:  DELTASONE  Take 20 mg daily x 5 days, then 10 mg a day x 5 days     tiotropium 18 MCG inhalation capsule  Commonly known as:  SPIRIVA  Place 1 capsule (18 mcg total) into inhaler and inhale daily.         Signed: Noreene Larsson, MS4 11/02/2014, 1:57 PM

## 2014-11-02 NOTE — Progress Notes (Signed)
Patient admitted to 3E23 from ED; pt is alert and oriented; VSS and within pt baseline; no s/s of distress noted at this time; will continue to monitor and assist as needed.

## 2014-11-02 NOTE — Progress Notes (Signed)
Family Medicine Teaching Service Daily Progress Note Intern Pager: [phone]  Patient name: Kelsey Garcia Medical record number: [id number] Date of birth: Oct 16, 1945 Age: 69 y.o. Gender: female  Primary Care Provider: Darden Amber, PA Consultants: None  Code Status: Full  Pt Overview and Major Events to Date:  9/22: Admitted with cough, SOB, and chest pain  Assessment and Plan: Kelsey Garcia is a 69 y.o. female with a history of NICM, HFpEF, permanent AFib not on anticoagulation, 2L-dependent COPD, former smoker, HTN, HLD, chronic pain and OCD presenting for cough and shortness of breath.  1. Acute on chronic respiratory failure due to acute, moderate COPD exacerbation: Increased cough and sputum volume possibly triggered by allergies. CXR without consolidation or significant pulmonary edema, no fever. Troponin negativex3, ECG stable AFib and nonischemic x2. 4 admissions in past 6 months for exacerbation, no history of intubation. PFT's 10/2012 show FEV1/FVC = 71% predicted, FEV1 = 49% predicted, DLCO 39% predicted (though should be treated as high-risk GOLD 3 or 4 due to hospitalizations). Has pulmonology follow up next week. - Continue to monitor on telemetry - Duonebs TID / albuterol neb q2h prn; continue high dose symbicort and spiriva.  - Oxygen by nasal cannula to maintain SpO2 88 - 97%, 2L today - Solumedrol '125mg'$  IV x1, complete 5 day course with prednisone '40mg'$  daily x 4 days (9/24-9/27) - Holding antibiotics for now, but would have low threshold to start with any signs of SIRS. Given recurrent recent abx, would cover for pseudomonas.  - Tessalon prn cough  - If AMS, arrhythmia, acidemia, or respiratory distress would get ABG: baseline shows compensated hypercapnia and hypoxemia without acidosis. - Per COPD gold protocol, consults to care management has been placed '[ ]'$  flu shot prior to d/c  2. Nonischemic cardiomyopathy with chronic HFpEF, though last echo in January with  preserved EF and diastolic parameter interpretation limited by AFib. Followed by [name]. Uncertain EDW as pt has continued to intentionally lose weight. Chronic heart failure likely contributing to hypoxemia with crackles and LE edema on exam, equivocally elevated BNP to 229.  - Treat mild volume overload with lasix '40mg'$  IV x2 ('40mg'$  po once daily is home dose), strict I/O, measure weight daily.  - Doubt ACS, troponin neg x3, ECG without ischemia x3, telemetry - Given chest pain, will get thoracic imaging to rule out fracture.   3. Permanent atrial fibrillation: Rate well controlled on metoprolol and diltiazem. s/p attempted cardioversion 2003 and 2005. CHADS2-VASc = 4 but pt refuses anticoagulation. Reports taking ASA daily though this has not been prescribed. TSH in May was 0.437 - Continue rate control agents - ECG afib without ischemia x2   FEN/GI: full PPx: enoxaparin  Disposition: Pending improvement in breathing, possibly 9/24  Subjective:  No acute events overnight. Still uncomfortable, constant pain in throat and lateral ribs, exacerbated by coughing.  Objective: Temp:  [97.9 F (36.6 C)-98.6 F (37 C)] 98.1 F (36.7 C) (09/23 0511) Pulse Rate:  [56-95] 93 (09/23 0511) Resp:  [16-26] 18 (09/23 0511) BP: (102-117)/(47-87) 111/57 mmHg (09/23 0511) SpO2:  [90 %-100 %] 96 % (09/23 0511) Weight:  [173 lb 14.4 oz (78.881 kg)-176 lb (79.833 kg)] 173 lb 14.4 oz (78.881 kg) (09/23 0511)  Physical Exam: General: Alert, speaking in full sentences, sitting in bed eating breakfast Cardiovascular: irregularly irregular, no m/r/g Respiratory: increased WOB, frequent coughing, increased inspiratory and expiratory phases with prominent wheezes, bilateral basilateral crackles Extremities: 1+ edema bilaterally halfway up shins. Extremities warm and  well perfused, pedal pulses intact.  Laboratory:  Recent Labs Lab 11/01/14 1535 11/02/14 0718  WBC 9.0 8.3  HGB 11.3* 11.3*  HCT  37.2 35.9*  PLT 269 266    Recent Labs Lab 11/01/14 1535 11/02/14 0718  NA 143 136  K 3.9 3.7  CL 102 97*  CO2 31 30  BUN 12 15  CREATININE 1.02* 0.91  CALCIUM 8.9 8.8*  PROT 6.4*  --   BILITOT 0.2*  --   ALKPHOS 85  --   ALT 13*  --   AST 12*  --   GLUCOSE 135* 143*    BNP: 229 Troponin: 0.03 x 3   Imaging/Diagnostic Tests: EKG: atrial fibrillation, no ischemia. CXR: The heart size and mediastinal contours are stable. There is chronic elevated right hemidiaphragm. The heart size is enlarged. The aorta is tortuous. There is no focal infiltrate, pulmonary edema, or pleural effusion. The visualized skeletal structures are unremarkable.  Kelsey Garcia, MS4   RESIDENT ADDENDUM  I have separately seen and examined the patient. I have discussed the findings and exam with the medical student and agree with the above note, which I have edited appropriately. I helped develop the management plan that is described in the student's note, and I agree with the content.  Additionally I have outlined my exam and assessment/plan below:   PE:  Blood pressure 118/69, pulse 108, temperature 98.4 F (36.9 C), temperature source Oral, resp. rate 18, height '5\' 5"'$  (1.651 m), weight 173 lb 14.4 oz (78.881 kg), SpO2 95 %. Gen: sitting up [place] style in bed, very conversant  CV: Irregularly irregular rhythm, no m/r/g noted. No pitting edema noted  Lungs with diffuse inspiratory and expiratory wheezing bilaterally, poor air movement in the bases. No increased WOB, speaking in long sentences without distress 2+ DP pulses bilaterally   A/P:  COPD exacerbation: On 3L [place], home requirement of 2L. Continues to have diffuse wheezing. No fevers, chills, or leukocytosis. Continue prednisone for 5d total. Scheduled Duonebs. If fever or leukocytosis add antibiotics.   HFpEF: Appears slightly volume overloaded to euvolemic today. S/p Lasix '40mg'$  IV x 2 on 9/22. Continue home lasix '40mg'$  PO. Strict I&Os and  daily weights. Dry weight appears to be around 184, however has had some fluctuating.   Chest pain: EKG unchanged. Troponins negative x 3. Appears to MSK be secondary coughing. Will get thoracic imaging.   Archie Patten, MD PGY-2,  [facility] 11/02/2014  2:25 PM

## 2014-11-02 NOTE — Progress Notes (Signed)
OT Cancellation Note and Discharge  Patient Details Name: Kelsey Garcia MRN: [id number] DOB: [birth year]   Cancelled Treatment:    Reason Eval/Treat Not Completed: OT screened, no needs identified, will sign off. In to see pt and explained to her the role of OT. She reports that she knows she will be able to manage her BADLs at home and she has someone in the house with her 24/7 per PT note.  Almon Register [phone] 11/02/2014, 2:25 PM

## 2014-11-02 NOTE — Evaluation (Signed)
Physical Therapy Evaluation Patient Details Name: Kelsey Garcia MRN: [id number] DOB: 1945-12-16 Today's Date: 11/02/2014   History of Present Illness  69 year old Caucasian female with past medical history of hypertension, hyperlipidemia, atrial fibrillation, heart failure with preserved EF, COPD, baseline 2 L oxygen need, and anxiety admitted with increasing shortness of breath over the past few days. Patient was recently discharged from the hospital on 09/21/2014 for COPD exacerbation. Patient reports increasing shortness of breath, productive sputum, and chest pain over the past 3 days.  Clinical Impression  Pt admitted with/for COPD exac, CHF.  Pt currently limited functionally due to the problems listed below.  (see problems list.)  Pt will benefit from PT to maximize function and safety to be able to get home safely with available assist of family.     Follow Up Recommendations No PT follow up    Equipment Recommendations  None recommended by PT    Recommendations for Other Services       Precautions / Restrictions        Mobility  Bed Mobility Overal bed mobility: Modified Independent                Transfers Overall transfer level: Modified independent                  Ambulation/Gait Ambulation/Gait assistance: Supervision Ambulation Distance (Feet): 90 Feet Assistive device: None   Gait velocity: slower Gait velocity interpretation: at or above normal speed for age/gender General Gait Details: generally steady pulling the Portable O2 tank.  Dyspnea 2-3/4  Stairs            Wheelchair Mobility    Modified Rankin (Stroke Patients Only)       Balance Overall balance assessment: No apparent balance deficits (not formally assessed)                                           Pertinent Vitals/Pain Pain Assessment: No/denies pain    Home Living Family/patient expects to be discharged to:: Private residence Living  Arrangements: Children Available Help at Discharge: Family;Available 24 hours/day Type of Home: House Home Access: Stairs to enter Entrance Stairs-Rails: Left Entrance Stairs-Number of Steps: 5-6 Home Layout: Two level;Bed/bath upstairs Home Equipment: Walker - 2 wheels;Wheelchair - power;Shower seat;Grab bars - tub/shower;Bedside commode      Prior Function Level of Independence: Independent with assistive device(s);Needs assistance         Comments: Pt reports she spends most of her times upstairs since bed and bath are up there.   She tries to go up and down the steps 2x/day      Hand Dominance   Dominant Hand: Right    Extremity/Trunk Assessment   Upper Extremity Assessment: Defer to OT evaluation           Lower Extremity Assessment: Overall WFL for tasks assessed (proximal weakness)         Communication   Communication: No difficulties  Cognition Arousal/Alertness: Awake/alert Behavior During Therapy: WFL for tasks assessed/performed;Restless Overall Cognitive Status: Within Functional Limits for tasks assessed                      General Comments      Exercises        Assessment/Plan    PT Assessment Patient needs continued PT services  PT Diagnosis Difficulty walking;Other (comment) (decr activity  tolerance)   PT Problem List Decreased strength;Decreased activity tolerance;Decreased mobility;Cardiopulmonary status limiting activity  PT Treatment Interventions Gait training;Stair training;Functional mobility training;Therapeutic activities;Patient/family education   PT Goals (Current goals can be found in the Care Plan section) Acute Rehab PT Goals Patient Stated Goal: home able to do more for myself without feeling like this. PT Goal Formulation: With patient Time For Goal Achievement: 11/09/14 Potential to Achieve Goals: Good    Frequency Min 3X/week   Barriers to discharge        Co-evaluation               End of  Session Equipment Utilized During Treatment: Oxygen Activity Tolerance: Patient tolerated treatment well;Patient limited by fatigue;Other (comment) (SOB) Patient left: in bed;with call bell/phone within reach Nurse Communication: Mobility status         Time: 3212-2482 PT Time Calculation (min) (ACUTE ONLY): 18 min   Charges:   PT Evaluation $Initial PT Evaluation Tier I: 1 Procedure     PT G Codes:        Jolette Lana, Tessie Fass 11/02/2014, 1:50 PM  11/02/2014  Donnella Sham, [place] [phone]  (pager)

## 2014-11-03 ENCOUNTER — Encounter (HOSPITAL_COMMUNITY): Payer: Self-pay | Admitting: Family Medicine

## 2014-11-03 DIAGNOSIS — M4850XA Collapsed vertebra, not elsewhere classified, site unspecified, initial encounter for fracture: Secondary | ICD-10-CM | POA: Diagnosis present

## 2014-11-03 DIAGNOSIS — I5033 Acute on chronic diastolic (congestive) heart failure: Secondary | ICD-10-CM

## 2014-11-03 DIAGNOSIS — S22009A Unspecified fracture of unspecified thoracic vertebra, initial encounter for closed fracture: Secondary | ICD-10-CM

## 2014-11-03 DIAGNOSIS — I480 Paroxysmal atrial fibrillation: Secondary | ICD-10-CM

## 2014-11-03 HISTORY — DX: Unspecified fracture of unspecified thoracic vertebra, initial encounter for closed fracture: S22.009A

## 2014-11-03 MED ORDER — ALENDRONATE SODIUM 10 MG PO TABS
70.0000 mg | ORAL_TABLET | ORAL | Status: DC
  Filled 2014-11-03: qty 7

## 2014-11-03 NOTE — Progress Notes (Signed)
Family Medicine Teaching Service Daily Progress Note Intern Pager: [phone]  Patient name: Kelsey Garcia Medical record number: [id number] Date of birth: October 01, 1945 Age: 69 y.o. Gender: female  Primary Care Provider: Darden Amber, PA Consultants: None  Code Status: Full  Pt Overview and Major Events to Date:  9/22: Admitted with cough, SOB, and chest pain  Assessment and Plan: Kelsey Garcia is a 69 y.o. female with a history of NICM, HFpEF, permanent AFib not on anticoagulation, 2L-dependent COPD, former smoker, HTN, HLD, chronic pain and OCD presenting for cough and shortness of breath.  1. Acute on chronic respiratory failure due to acute, moderate COPD exacerbation: Increased cough and sputum volume possibly triggered by allergies. CXR without consolidation or significant pulmonary edema, no fever. Troponin negativex3, ECG stable AFib and nonischemic x2.  Has pulmonology follow up next week. - Continue to monitor on telemetry - Duonebs TID / albuterol neb q2h prn; continue high dose symbicort and spiriva.  - Oxygen by nasal cannula to maintain SpO2 93 - 100%, 2L today, which is his home O2 level - Solumedrol '125mg'$  IV x1, complete 5 day course with prednisone '40mg'$  daily x 4 days (9/24-9/27) - Holding antibiotics for now, but would have low threshold to start with any signs of SIRS. Given recurrent recent abx, would cover for pseudomonas.  - Tessalon prn cough  - If AMS, arrhythmia, acidemia, or respiratory distress would get ABG: baseline shows compensated hypercapnia and hypoxemia without acidosis. -  flu shot prior to d/c  2. Nonischemic cardiomyopathy with chronic HFpEF, though last echo in January with preserved EF and diastolic parameter interpretation limited by AFib. Followed by [name]. Uncertain EDW as pt has continued to intentionally lose weight. Chronic heart failure likely contributing to hypoxemia with crackles and LE edema on exam, equivocally elevated BNP to 229.  -  Treat mild volume overload with lasix '40mg'$  IV x2, then transitioned to home dose of 40 mg PO daily  -poor output in the last 24 hours; daily weight 173 >>174 lbs  - Given chest pain, will get thoracic imaging to rule out fracture: wedge compression fractures of T4 and T6 are acute to suabacute   3. Permanent atrial fibrillation: Rate well controlled on metoprolol and diltiazem. s/p attempted cardioversion 2003 and 2005. CHADS2-VASc = 4 but pt refuses anticoagulation. Reports taking ASA daily though this has not been prescribed. TSH in May was 0.437 - Continue rate control agents - ECG afib without ischemia x2  4. T Spine Compression Fractures: wedge compression fractures of T4 and T6 are acute to suabacute  -investigate intolerance to bisphosphonates; consider Vit D and Calcium -pain control with percocet q8 hours prn   FEN/GI: full PPx: enoxaparin  Disposition: Pending improvement in breathing, possibly 9/24  Subjective:  No acute events overnight. Reports that cough is stable.  Objective: Temp:  [97.6 F (36.4 C)-99.1 F (37.3 C)] 97.8 F (36.6 C) (09/24 0500) Pulse Rate:  [82-108] 84 (09/24 0500) Resp:  [18] 18 (09/24 0500) BP: (110-118)/(55-69) 114/58 mmHg (09/24 0500) SpO2:  [93 %-100 %] 93 % (09/24 0500) Weight:  [174 lb 12.8 oz (79.289 kg)] 174 lb 12.8 oz (79.289 kg) (09/24 0500)  Physical Exam: General: Alert, speaking in full sentences, breathing tx occuring  Cardiovascular: irregularly irregular, no m/r/g Respiratory: normal WOB, occasional cough, increased inspiratory and expiratory phases with prominent wheezes, bilateral basilateral crackles Extremities: 1+ edema bilaterally halfway up shins. Extremities warm and well perfused, pedal pulses intact.  Laboratory:  Recent Labs  Lab 11/01/14 1535 11/02/14 0718  WBC 9.0 8.3  HGB 11.3* 11.3*  HCT 37.2 35.9*  PLT 269 266    Recent Labs Lab 11/01/14 1535 11/02/14 0718  NA 143 136  K 3.9 3.7  CL 102 97*   CO2 31 30  BUN 12 15  CREATININE 1.02* 0.91  CALCIUM 8.9 8.8*  PROT 6.4*  --   BILITOT 0.2*  --   ALKPHOS 85  --   ALT 13*  --   AST 12*  --   GLUCOSE 135* 143*    BNP: 229 Troponin: 0.03 x 3   Nicolette Bang, DO PGY-1,  [facility] 11/03/2014  8:51 AM

## 2014-11-04 DIAGNOSIS — S22049D Unspecified fracture of fourth thoracic vertebra, subsequent encounter for fracture with routine healing: Secondary | ICD-10-CM

## 2014-11-04 MED ORDER — PREDNISONE 20 MG PO TABS
40.0000 mg | ORAL_TABLET | Freq: Every day | ORAL | Status: DC
  Administered 2014-11-04: 40 mg via ORAL
  Filled 2014-11-04: qty 2

## 2014-11-04 MED ORDER — PREDNISONE 20 MG PO TABS: 40.0000 mg | ORAL_TABLET | Freq: Every day | ORAL | Status: DC

## 2014-11-04 MED ORDER — BENZONATATE 200 MG PO CAPS: 200.0000 mg | ORAL_CAPSULE | Freq: Three times a day (TID) | ORAL | Status: DC | PRN

## 2014-11-04 NOTE — Care Management Note (Signed)
Case Management Note  Patient Details  Name: GENEVE KIMPEL MRN: [id number] Date of Birth: 1945-08-28  Subjective/Objective:                   Admitted with cough, SOB, and chest pain Action/Plan: Pt receives O2 from [facility] but does not have portable tank to transport home.  CM called [facility] DME rep, Corene Cornea to please deliver the O2 to room so pt can discharge.  CSW arranging for transportation home.  No other CM needs were communicated.  Expected Discharge Date:                  Expected Discharge Plan:  Home/Self Care  In-House Referral:     Discharge planning Services  CM Consult  Post Acute Care Choice:    Choice offered to:     DME Arranged:  Oxygen DME Agency:     HH Arranged:    HH Agency:     Status of Service:  Completed, signed off  Medicare Important Message Given:    Date Medicare IM Given:    Medicare IM give by:    Date Additional Medicare IM Given:    Additional Medicare Important Message give by:     If discussed at [place] of Stay Meetings, dates discussed:    Additional Comments:  Dellie Catholic, RN 11/04/2014, 12:42 PM

## 2014-11-04 NOTE — Progress Notes (Signed)
Family Medicine Teaching Service Daily Progress Note Intern Pager: [phone]  Patient name: Kelsey Garcia Medical record number: [id number] Date of birth: 1945-11-21 Age: 69 y.o. Gender: female  Primary Care Provider: Darden Amber, PA Consultants: None  Code Status: Full  Pt Overview and Major Events to Date:  9/22: Admitted with cough, SOB, and chest pain  Assessment and Plan: Kelsey Garcia is a 69 y.o. female with a history of NICM, HFpEF, permanent AFib not on anticoagulation, 2L-dependent COPD, former smoker, HTN, HLD, chronic pain and OCD presenting for cough and shortness of breath.  1. Acute on chronic respiratory failure due to acute, moderate COPD exacerbation: Increased cough and sputum volume possibly triggered by allergies. CXR without consolidation or significant pulmonary edema, no fever. Troponin negativex3, ECG stable AFib and nonischemic x2.  Has pulmonology follow up next week. - Discontinue Telemetry - Duonebs TID / albuterol neb q2h prn (no PRNs needed >24 hours); continue high dose symbicort and spiriva.  - Oxygen by nasal cannula to maintain SpO2 94%, 2L today (baseline) - Solumedrol '125mg'$  IV>>Prednisone '40mg'$  daily x 4 days (9/24-9/27) - Holding antibiotics for now, but would have low threshold to start with any signs of SIRS. Given recurrent recent abx, would cover for pseudomonas.  - Tessalon prn cough  - If AMS, arrhythmia, acidemia, or respiratory distress would get ABG: baseline shows compensated hypercapnia and hypoxemia without acidosis. -  flu shot prior to d/c  2. Nonischemic cardiomyopathy with chronic HFpEF, though last echo in January with preserved EF and diastolic parameter interpretation limited by AFib. Followed by [name]. Uncertain EDW as pt has continued to intentionally lose weight. Chronic heart failure likely contributing to hypoxemia with crackles and LE edema on exam, equivocally elevated BNP to 229.  - s/p lasix '40mg'$  IV x2, transitioned  to home dose of 40 mg PO daily (9/23-) - Output -1.5L in the last 24 hours; daily weight 174 >>177 lbs  - Wedge compression fractures of T4 and T6 are acute to suabacute   3. Permanent atrial fibrillation: Rate well controlled on metoprolol and diltiazem. s/p attempted cardioversion 2003 and 2005. CHADS2-VASc = 4 but pt refuses anticoagulation. Reports taking ASA daily though this has not been prescribed. TSH in May was 0.437 - Continue rate control agents - ECG afib without ischemia x2  4. T Spine Compression Fractures: wedge compression fractures of T4 and T6 are acute to suabacute  -investigate intolerance to bisphosphonates; consider Vit D and Calcium -pain control with percocet q8 hours prn (3 doses/ 24 hours).  Will not discharge with this medication as patient sees pain management ([facility]).  F/u appt in 2 weeks.  FEN/GI: full PPx: enoxaparin  Disposition: Pending improvement in breathing, possibly today.  Subjective:  Patient reports that she is doing well.  She denies CP, edema.  She reports that she is breathing at baseline.  She reports that her back is fine today.  She does note that she does not feel that Percocet makes a huge difference.  She also reports that she sees [facility].  She has an appt with them in about 2 weeks.  She has f/u with Dr [name] (Pulm) on Thurs and her PCP on Tues.  She feels ready for dc home today.  Denies any concerns at this time.  Objective: Temp:  [97.8 F (36.6 C)-98.3 F (36.8 C)] 98.3 F (36.8 C) (09/25 0514) Pulse Rate:  [82-102] 102 (09/25 0514) Resp:  [18-20] 20 (09/25 0514) BP: (110-129)/(47-74) 129/74  mmHg (09/25 0514) SpO2:  [94 %-97 %] 94 % (09/25 0840) Weight:  [177 lb 1.6 oz (80.332 kg)] 177 lb 1.6 oz (80.332 kg) (09/25 0514)  Physical Exam: General: Alert, speaking in full sentences, sitting up in bed having breakfast, NAD Cardiovascular: irregularly irregular, no m/r/g Respiratory: normal WOB, occasional cough, increased  inspiratory and expiratory phases with mild wheeze appreciated in b/l pulmonary apex GI: obese, ND/NT Spine: Mild TTP to lower thoracic vertebrae. Extremities: WWP, no edema, pedal pulses intact. Neuro: follows commands, no focal deficits Psych: speech normal, mood stable, good eye contact  Laboratory:  Recent Labs Lab 11/01/14 1535 11/02/14 0718  WBC 9.0 8.3  HGB 11.3* 11.3*  HCT 37.2 35.9*  PLT 269 266    Recent Labs Lab 11/01/14 1535 11/02/14 0718  NA 143 136  K 3.9 3.7  CL 102 97*  CO2 31 30  BUN 12 15  CREATININE 1.02* 0.91  CALCIUM 8.9 8.8*  PROT 6.4*  --   BILITOT 0.2*  --   ALKPHOS 85  --   ALT 13*  --   AST 12*  --   GLUCOSE 135* 143*    BNP: 229 Troponin: 0.03 x 3   [name], DO PGY-2  [facility] 11/04/2014  9:36 AM

## 2014-11-04 NOTE — Progress Notes (Signed)
Patient discharge to home accompanied by NT via cab. Social worker arranged the cab for the patient. Patient is on oxygen via nasal cannula with portable oxygen tank. .Discharge instructions given. Patient verbalizes understanding. All personal belongings given. Patient's stable.

## 2014-11-04 NOTE — Progress Notes (Signed)
Notified Case Management for oxygen set up and transportation home.

## 2014-11-04 NOTE — Progress Notes (Addendum)
CSW notified earlier today by weekend [facility] to assist patient with transportation home.  CSW met with patient- she requires continuous 02 and RNCM provided patient with a portable tank. She is unable to utilize the bus system as she cannot breath well enough to walk from the bus station to home. She normally uses [facility] and CSW attempted without success to reach [employer] office to request assistance. Patient states that her daughter lives with her but they do not have a car.  CSW arranged for voucher to [facility] service to transport patient home this afternoon. Unit RN is aware. Patient was very appreciative of CSW's assistance.Patient confirmed that her daughter was at home and could assist her to walk from the taxi into the house.   No further CSW needs identified. CSW signing off.  Lorie Phenix. Pauline Good, [place] (weekend coverage)

## 2014-11-04 NOTE — Discharge Instructions (Signed)
A prescription for Prednisone has been sent into your pharmacy.  You will have 1 additional day to take of this starting tomorrow to complete a 5 day course of steroid treatment for your COPD.  Please make sure to follow up with your PCP and Pulmonologist as scheduled.     Chronic Obstructive Pulmonary Disease Exacerbation Chronic obstructive pulmonary disease (COPD) is a common lung condition in which airflow from the lungs is limited. COPD is a general term that can be used to describe many different lung problems that limit airflow, including chronic bronchitis and emphysema. COPD exacerbations are episodes when breathing symptoms become much worse and require extra treatment. Without treatment, COPD exacerbations can be life threatening, and frequent COPD exacerbations can cause further damage to your lungs. CAUSES   Respiratory infections.   Exposure to smoke.   Exposure to air pollution, chemical fumes, or dust. Sometimes there is no apparent cause or trigger. RISK FACTORS  Smoking cigarettes.  Older age.  Frequent prior COPD exacerbations. SIGNS AND SYMPTOMS   Increased coughing.   Increased thick spit (sputum) production.   Increased wheezing.   Increased shortness of breath.   Rapid breathing.   Chest tightness. DIAGNOSIS  Your medical history, a physical exam, and tests will help your health care provider make a diagnosis. Tests may include:  A chest X-ray.  Basic lab tests.  Sputum testing.  An arterial blood gas test. TREATMENT  Depending on the severity of your COPD exacerbation, you may need to be admitted to a hospital for treatment. Some of the treatments commonly used to treat COPD exacerbations are:   Antibiotic medicines.   Bronchodilators. These are drugs that expand the air passages. They may be given with an inhaler or nebulizer. Spacer devices may be needed to help improve drug delivery.  Corticosteroid medicines.  Supplemental oxygen  therapy.  HOME CARE INSTRUCTIONS   Do not smoke. Quitting smoking is very important to prevent COPD from getting worse and exacerbations from happening as often.  Avoid exposure to all substances that irritate the airway, especially to tobacco smoke.   If you were prescribed an antibiotic medicine, finish it all even if you start to feel better.  Take all medicines as directed by your health care provider.It is important to use correct technique with inhaled medicines.  Drink enough fluids to keep your urine clear or pale yellow (unless you have a medical condition that requires fluid restriction).  Use a cool mist vaporizer. This makes it easier to clear your chest when you cough.   If you have a home nebulizer and oxygen, continue to use them as directed.   Maintain all necessary vaccinations to prevent infections.   Exercise regularly.   Eat a healthy diet.   Keep all follow-up appointments as directed by your health care provider. SEEK IMMEDIATE MEDICAL CARE IF:  You have worsening shortness of breath.   You have trouble talking.   You have severe chest pain.  You have blood in your sputum.  You have a fever.  You have weakness, vomit repeatedly, or faint.   You feel confused.   You continue to get worse. MAKE SURE YOU:   Understand these instructions.  Will watch your condition.  Will get help right away if you are not doing well or get worse. Document Released: 11/23/2006 Document Revised: 06/12/2013 Document Reviewed: 09/30/2012 [facility] Patient Information 2015 [place], [place]. This information is not intended to replace advice given to you by your health care provider.  Make sure you discuss any questions you have with your health care provider. ° °

## 2014-11-05 ENCOUNTER — Other Ambulatory Visit: Payer: Self-pay | Admitting: Pulmonary Disease

## 2014-11-06 ENCOUNTER — Emergency Department (HOSPITAL_COMMUNITY)
Admission: EM | Admit: 2014-11-06 | Discharge: 2014-11-06 | Disposition: A | Discharge status: Home / Self Care | Payer: Medicare Other | Attending: Emergency Medicine | Admitting: Emergency Medicine

## 2014-11-06 ENCOUNTER — Emergency Department (HOSPITAL_COMMUNITY): Payer: Medicare Other

## 2014-11-06 ENCOUNTER — Encounter (HOSPITAL_COMMUNITY): Payer: Self-pay | Admitting: *Deleted

## 2014-11-06 ENCOUNTER — Ambulatory Visit: Payer: Medicare Other | Admitting: Pulmonary Disease

## 2014-11-06 DIAGNOSIS — I10 Essential (primary) hypertension: Secondary | ICD-10-CM | POA: Diagnosis not present

## 2014-11-06 DIAGNOSIS — R05 Cough: Secondary | ICD-10-CM | POA: Diagnosis not present

## 2014-11-06 DIAGNOSIS — I509 Heart failure, unspecified: Secondary | ICD-10-CM | POA: Diagnosis not present

## 2014-11-06 DIAGNOSIS — K219 Gastro-esophageal reflux disease without esophagitis: Secondary | ICD-10-CM | POA: Diagnosis not present

## 2014-11-06 DIAGNOSIS — G8929 Other chronic pain: Secondary | ICD-10-CM | POA: Diagnosis not present

## 2014-11-06 DIAGNOSIS — I4891 Unspecified atrial fibrillation: Secondary | ICD-10-CM | POA: Insufficient documentation

## 2014-11-06 DIAGNOSIS — Z79899 Other long term (current) drug therapy: Secondary | ICD-10-CM | POA: Insufficient documentation

## 2014-11-06 DIAGNOSIS — M1990 Unspecified osteoarthritis, unspecified site: Secondary | ICD-10-CM | POA: Diagnosis not present

## 2014-11-06 DIAGNOSIS — M25511 Pain in right shoulder: Secondary | ICD-10-CM | POA: Diagnosis not present

## 2014-11-06 DIAGNOSIS — J441 Chronic obstructive pulmonary disease with (acute) exacerbation: Secondary | ICD-10-CM | POA: Diagnosis not present

## 2014-11-06 DIAGNOSIS — R079 Chest pain, unspecified: Secondary | ICD-10-CM | POA: Diagnosis not present

## 2014-11-06 DIAGNOSIS — Z7952 Long term (current) use of systemic steroids: Secondary | ICD-10-CM | POA: Insufficient documentation

## 2014-11-06 DIAGNOSIS — E785 Hyperlipidemia, unspecified: Secondary | ICD-10-CM | POA: Diagnosis not present

## 2014-11-06 DIAGNOSIS — Z8701 Personal history of pneumonia (recurrent): Secondary | ICD-10-CM | POA: Insufficient documentation

## 2014-11-06 DIAGNOSIS — Z7951 Long term (current) use of inhaled steroids: Secondary | ICD-10-CM | POA: Insufficient documentation

## 2014-11-06 LAB — BASIC METABOLIC PANEL
ANION GAP: 8 (ref 5–15)
BUN: 17 mg/dL (ref 6–20)
CALCIUM: 8.6 mg/dL — AB (ref 8.9–10.3)
CHLORIDE: 100 mmol/L — AB (ref 101–111)
CO2: 33 mmol/L — AB (ref 22–32)
Creatinine, Ser: 0.7 mg/dL (ref 0.44–1.00)
GFR calc non Af Amer: >60 mL/min (ref >60)
Glucose, Bld: 106 mg/dL — ABNORMAL HIGH (ref 65–99)
Potassium: 4.3 mmol/L (ref 3.5–5.1)
Sodium: 141 mmol/L (ref 135–145)

## 2014-11-06 LAB — CBC
HCT: 36.5 % (ref 36.0–46.0)
HEMOGLOBIN: 11.4 g/dL — AB (ref 12.0–15.0)
MCH: 30.4 pg (ref 26.0–34.0)
MCHC: 31.2 g/dL (ref 30.0–36.0)
MCV: 97.3 fL (ref 78.0–100.0)
Platelets: 270 10*3/uL (ref 150–400)
RBC: 3.75 MIL/uL — AB (ref 3.87–5.11)
RDW: 14.2 % (ref 11.5–15.5)
WBC: 10.6 10*3/uL — AB (ref 4.0–10.5)

## 2014-11-06 LAB — I-STAT TROPONIN, ED: TROPONIN I, POC: 0.01 ng/mL (ref 0.00–0.08)

## 2014-11-06 MED ORDER — OXYCODONE-ACETAMINOPHEN 5-325 MG PO TABS
2.0000 | ORAL_TABLET | Freq: Once | ORAL | Status: AC
Start: 2014-11-06 — End: 2014-11-06
  Administered 2014-11-06: 2 via ORAL
  Filled 2014-11-06 (×2): qty 2

## 2014-11-06 NOTE — ED Notes (Signed)
Per [facility], pt d/c'd from hospital on Sunday, c/o continued c/p upon d/c, lives with daughter who smokes in the home, took ASA 324 before calling 911, then when she called they told her to take 4 baby ASA which she did.  Has hx of A-fib.

## 2014-11-06 NOTE — ED Provider Notes (Signed)
CSN: [id number]     Arrival date & time 11/06/14  0913 History   First MD Initiated Contact with Patient 11/06/14 1019     Chief Complaint  Patient presents with  . Chest Pain    right  . Shoulder Pain    right     (Consider location/radiation/quality/duration/timing/severity/associated sxs/prior Treatment) HPI  69 year old female with a history of CHF, COPD on chronic oxygen, and chronic atrial fibrillation presents with continued right shoulder pain and her chest pain. She states the chest pain has been resin for at least the past 3 weeks and is constant. Shoulder pain has been chronic for the past 10 months and is worse and she ran out of her Percocet last night. Patient has a pain contract with the pain clinic. Her next refill Percocet as next week. Has not had any fevers or chills. States she is chronically short of breath but not worse than typical right now does not think she needs any breathing treatments. She is on her home 2 L of oxygen. The main issue is the chronic shoulder pain; states it always gets this bad whenever she doesn't have her pain medicine. No weakness or numbness. No fevers or chills. She has not injured this shoulder recently. Patient had all the symptoms when she was admitted just a few days ago as well as when she had a negative CT scan for pulmonary embolus one week ago.  Past Medical History  Diagnosis Date  . Atrial fibrillation   . Hypertension   . Insomnia   . Nonischemic cardiomyopathy   . Hyperlipidemia   . Anxiety   . Cholelithiasis   . Insomnia   . Mitral regurgitation     noted 2010  . H/O epistaxis   . Rhinitis, allergic   . CHF (congestive heart failure)   . Pneumonia     "several times w/exacerbations of the COPD; nothing in the last year" (07/15/2012)  . COPD (chronic obstructive pulmonary disease)     as of 7/13 on 2-3L, pfts 10/2008 with mod obstruction  . Chronic bronchitis with COPD (chronic obstructive pulmonary disease)   . Shortness  of breath     "all the time right now" (07/15/2012)  . GERD (gastroesophageal reflux disease)   . GMWNUUVO(536.6)     "weekly" (07/15/2012)  . Migraines     "weekly for awhile; cleared up as I got older" (07/15/2012)  . DJD (degenerative joint disease)   . Arthritis     "all over" (07/15/2012)  . OCD (obsessive compulsive disorder)   . OCD (obsessive compulsive disorder)   . Depression     h/o SI; "last time I was really serious about it was ~ 1997" (07/15/2012)  . Thoracic vertebral fracture 11/03/2014   Past Surgical History  Procedure Laterality Date  . Tubal ligation  1972  . Cardioversion  2003; 07/2003   Family History  Problem Relation Age of Onset  . Asthma    . Emphysema    . Allergies    . Cancer      aunt had several types of cancer  . COPD Mother   . Emphysema Mother   . Cirrhosis Father    Social History  Substance Use Topics  . Smoking status: Former Smoker -- 1.00 packs/day for 35 years    Types: Cigarettes    Quit date: 06/18/2014  . Smokeless tobacco: Never Used  . Alcohol Use: No   OB History    No data available  Review of Systems  Constitutional: Negative for fever.  Respiratory: Positive for cough and shortness of breath.   Cardiovascular: Positive for chest pain. Negative for leg swelling.  Musculoskeletal: Positive for arthralgias.  All other systems reviewed and are negative.     Allergies  Ace inhibitors; Diphenhydramine hcl; Erythromycin; Nsaids; Nyquil; Tramadol hcl; Codeine; Fosamax; Tomato; Orange fruit; and Pseudoephedrine  Home Medications   Prior to Admission medications   Medication Sig Start Date End Date Taking? Authorizing Provider  albuterol (PROVENTIL HFA;VENTOLIN HFA) 108 (90 BASE) MCG/ACT inhaler Inhale 1-2 puffs into the lungs every 6 (six) hours as needed for wheezing. 10/19/14   Rigoberto Noel, MD  albuterol (PROVENTIL) (2.5 MG/3ML) 0.083% nebulizer solution Take 3 mLs (2.5 mg total) by nebulization every 6 (six) hours as  needed for wheezing. 09/21/14   Maryann Mikhail, DO  benzonatate (TESSALON) 200 MG capsule Take 1 capsule (200 mg total) by mouth 3 (three) times daily as needed for cough. 11/04/14   Ashly Windell Moulding, DO  budesonide-formoterol (SYMBICORT) 160-4.5 MCG/ACT inhaler Inhale 2 puffs into the lungs 2 (two) times daily. 08/07/14   Rigoberto Noel, MD  busPIRone (BUSPAR) 10 MG tablet Take 10 mg by mouth 3 (three) times daily. 04/22/14   Historical Provider, MD  chlorpheniramine (CHLOR-TRIMETON) 4 MG tablet Take 1 tablet (4 mg total) by mouth every 8 (eight) hours as needed for allergies. Patient taking differently: Take 4 mg by mouth daily.  11/13/13   Dellia Nims, MD  diltiazem (TIAZAC) 420 MG 24 hr capsule Take 1 capsule (420 mg total) by mouth daily. 05/09/14 08/08/15  Bartholomew Crews, MD  FLUoxetine (PROZAC) 20 MG capsule TAKE THREE CAPSULES BY MOUTH ONCE DAILY Patient taking differently: Take 60 mg by mouth daily.  01/03/14   Tasrif Ahmed, MD  furosemide (LASIX) 20 MG tablet Take 40 mg by mouth daily.     Historical Provider, MD  guaiFENesin (MUCINEX) 600 MG 12 hr tablet Take 1 tablet (600 mg total) by mouth 2 (two) times daily. Patient taking differently: Take 600 mg by mouth 2 (two) times daily as needed for cough.  04/28/14   Tanna Furry, MD  hydroxypropyl methylcellulose / hypromellose (ISOPTO TEARS / GONIOVISC) 2.5 % ophthalmic solution Place 1 drop into both eyes daily as needed for dry eyes (dry eyes).     Historical Provider, MD  ipratropium-albuterol (DUONEB) 0.5-2.5 (3) MG/3ML SOLN Take 3 mLs by nebulization 3 (three) times daily. Patient not taking: Reported on 09/28/2014 09/21/14   Maryann Mikhail, DO  KLOR-CON M20 20 MEQ tablet Take 1 tablet by mouth daily. 09/26/14   Historical Provider, MD  metoprolol succinate (TOPROL-XL) 25 MG 24 hr tablet Take 0.5 tablets (12.5 mg total) by mouth daily after lunch. 07/02/14   Arnoldo Morale, MD  nystatin (MYCOSTATIN) 100000 UNIT/ML suspension Take 5 mLs  (500,000 Units total) by mouth as needed. Patient taking differently: Take 500,000 Units by mouth daily as needed (thrush).  04/16/14   Dellia Nims, MD  oxyCODONE-acetaminophen (PERCOCET/ROXICET) 5-325 MG per tablet Take 1 tablet by mouth every 12 (twelve) hours as needed for severe pain.    Historical Provider, MD  pantoprazole (PROTONIX) 40 MG tablet TAKE ONE TABLET BY MOUTH TWICE DAILY Patient taking differently: Take 40 mg by mouth 2 (two) times daily.  01/03/14   Dellia Nims, MD  predniSONE (DELTASONE) 20 MG tablet Take 2 tablets (40 mg total) by mouth daily with breakfast. 11/04/14   [name], DO  tiotropium (SPIRIVA) 18  MCG inhalation capsule Place 1 capsule (18 mcg total) into inhaler and inhale daily. 08/07/14   Rigoberto Noel, MD   BP 115/60 mmHg  Pulse 73  Temp(Src) 97.8 F (36.6 C) (Oral)  Resp 25  SpO2 98% Physical Exam  Constitutional: She is oriented to person, place, and time. She appears well-developed and well-nourished. No distress.  HENT:  Head: Normocephalic and atraumatic.  Right Ear: External ear normal.  Left Ear: External ear normal.  Nose: Nose normal.  Eyes: Right eye exhibits no discharge. Left eye exhibits no discharge.  Cardiovascular: Normal rate, regular rhythm and normal heart sounds.   Pulses:      Radial pulses are 2+ on the right side, and 2+ on the left side.  Pulmonary/Chest: Effort normal. She has wheezes (mild intermittent expiratory wheezes). She exhibits tenderness.  Abdominal: Soft. There is no tenderness.  Musculoskeletal:       Right shoulder: She exhibits tenderness. She exhibits no deformity.  Normal strength and sensation in upper extremities  Neurological: She is alert and oriented to person, place, and time.  Skin: Skin is warm and dry. She is not diaphoretic.  Nursing note and vitals reviewed.   ED Course  Procedures (including critical care time) Labs Review Labs Reviewed  BASIC METABOLIC PANEL - Abnormal; Notable for  the following:    Chloride 100 (*)    CO2 33 (*)    Glucose, Bld 106 (*)    Calcium 8.6 (*)    All other components within normal limits  CBC - Abnormal; Notable for the following:    WBC 10.6 (*)    RBC 3.75 (*)    Hemoglobin 11.4 (*)    All other components within normal limits  Randolm Idol, ED    Imaging Review Dg Chest 2 View  11/06/2014   CLINICAL DATA:  Chronic right-sided chest pain.  EXAM: CHEST  2 VIEW  COMPARISON:  November 01, 2014.  FINDINGS: The heart size and mediastinal contours are within normal limits. Both lungs are clear. No pneumothorax or pleural effusion is noted. Compression deformity of mid thoracic vertebral body is noted as described on prior radiograph.  IMPRESSION: No acute cardiopulmonary abnormality seen.   Electronically Signed   By: Marijo Conception, M.D.   On: 11/06/2014 10:08   I have personally reviewed and evaluated these images and lab results as part of my medical decision-making.   EKG Interpretation   Date/Time:  Tuesday November 06 2014 09:23:21 EDT Ventricular Rate:  79 PR Interval:    QRS Duration: 103 QT Interval:  388 QTC Calculation: 445 R Axis:   45 Text Interpretation:  Atrial fibrillation Low voltage, extremity and  precordial leads Probable anteroseptal infarct, old no significant change  since Sept 23 2016 Confirmed by Regenia Skeeter  MD, SCOTT ([id number]) on 11/06/2014  10:20:16 AM      MDM   Final diagnoses:  Chronic right shoulder pain  Nonspecific chest pain    Patient's presentation is consistent with chronic chest wall pain and right shoulder pain. Primary reason for presentation seems to be running out of her pain medicine. She has a pain contract with a pain specialist and thus I will not provide her with a prescription of pain medicine. She was given oral Percocet here and will be discharged home. I highly doubt ACS, pulmonary embolism, dissection, or CHF/COPD exacerbation. Her breathing is not an issue today. Discharge  home.    Sherwood Gambler, MD 11/06/14 [id number]

## 2014-11-06 NOTE — ED Notes (Signed)
Patient transported to X-ray 

## 2014-11-06 NOTE — ED Notes (Signed)
[facility] has been called for transport. 

## 2014-11-06 NOTE — ED Notes (Signed)
Bed: WA07 Expected date:  Expected time:  Means of arrival:  Comments: 6 yoF/chest/shoulder

## 2014-11-30 ENCOUNTER — Encounter: Payer: Self-pay | Admitting: Adult Health

## 2014-11-30 ENCOUNTER — Ambulatory Visit (INDEPENDENT_AMBULATORY_CARE_PROVIDER_SITE_OTHER): Payer: Medicare Other | Admitting: Adult Health

## 2014-11-30 VITALS — BP 106/70 | HR 61 | Temp 98.3°F | Ht 65.0 in | Wt 175.0 lb

## 2014-11-30 DIAGNOSIS — J449 Chronic obstructive pulmonary disease, unspecified: Secondary | ICD-10-CM

## 2014-11-30 DIAGNOSIS — J9611 Chronic respiratory failure with hypoxia: Secondary | ICD-10-CM

## 2014-11-30 NOTE — Assessment & Plan Note (Signed)
Compensated   Plan  Continue on Symbicort and Spiriva Rinse after all inhalers Continue on oxygen Follow-up with [name] in 3 months  and as needed Please contact office for sooner follow up if symptoms do not improve or worsen or seek emergency care

## 2014-11-30 NOTE — Progress Notes (Signed)
   Subjective:    Patient ID: Kelsey Garcia, female    DOB: 02-25-1945, 69 y.o.   MRN: [id number]  HPI 74/ F, smoker presents for FU of COPD.  She has a h/o opiate abuse in the 7's. She is not prescribed codeine containing medications due to her addiction hisotry.    11/30/2014 Follow up : COPD / O2 dependent RF  Returns for 2 month follow up .  She says she is slowly improving . Still has moments of getting sob with activity but feels better. Remains on Symbicort and Spiriva.  Remains on Oxygen 2l/m . Uses 3 l/m on pulsing concentrator.  She denies any hemoptysis, orthopnea, PND, or increased leg swelling. Declines flu shot.   Significant tests/ events PFT's 10/2012 show FEV1/FVC = 71% predicted, FEV1 = 49% predicted, DLCO 39% predicted CT 06/2014 no ILD, resolved nodular ASD CXR 07/2014 int prominence  Review of Systems neg for any significant sore throat, dysphagia, itching, sneezing, nasal congestion or excess/ purulent secretions, fever, chills, sweats, unintended wt loss, pleuritic or exertional cp, hempoptysis, orthopnea pnd or change in chronic leg swelling. Also denies presyncope, palpitations, heartburn, abdominal pain, nausea, vomiting, diarrhea or change in bowel or urinary habits, dysuria,hematuria, rash, arthralgias, visual complaints, headache, numbness weakness or ataxia.     Objective:   Physical Exam  Gen.well-nourished, in no distress, normal affect, chronically ill appearing on o2 in wc.  ENT - no lesions, no post nasal drip Neck: No JVD, no thyromegaly, no carotid bruits Lungs: no use of accessory muscles, no dullness to percussion, clear without rales or rhonchi  Cardiovascular: Rhythm regular, heart sounds  normal, no murmurs or gallops, trace peripheral edema Abdomen: soft and non-tender, no hepatosplenomegaly, BS normal. Musculoskeletal: No deformities, no cyanosis or clubbing Neuro:  alert, non focal       Assessment & Plan:

## 2014-11-30 NOTE — Patient Instructions (Signed)
Continue on Symbicort and Spiriva Rinse after all inhalers Continue on oxygen Follow-up with [name] in 3 months  and as needed Please contact office for sooner follow up if symptoms do not improve or worsen or seek emergency care

## 2014-11-30 NOTE — Assessment & Plan Note (Signed)
Continue on oxygen Follow-up with [name] in 3 months  and as needed Please contact office for sooner follow up if symptoms do not improve or worsen or seek emergency care

## 2014-12-03 NOTE — Progress Notes (Signed)
Reviewed & agree with plan  

## 2014-12-05 ENCOUNTER — Other Ambulatory Visit: Payer: Self-pay | Admitting: Internal Medicine

## 2014-12-05 NOTE — Telephone Encounter (Signed)
Pt no longer under [facility] care-refill request was denied.[name]10/26/20163:37 PM

## 2014-12-06 ENCOUNTER — Other Ambulatory Visit: Payer: Self-pay | Admitting: Internal Medicine

## 2014-12-10 ENCOUNTER — Emergency Department (HOSPITAL_COMMUNITY)
Admission: EM | Admit: 2014-12-10 | Discharge: 2014-12-10 | Disposition: A | Discharge status: Home / Self Care | Payer: Medicare Other | Attending: Emergency Medicine | Admitting: Emergency Medicine

## 2014-12-10 ENCOUNTER — Encounter (HOSPITAL_COMMUNITY): Payer: Self-pay | Admitting: Emergency Medicine

## 2014-12-10 DIAGNOSIS — F419 Anxiety disorder, unspecified: Secondary | ICD-10-CM | POA: Insufficient documentation

## 2014-12-10 DIAGNOSIS — I509 Heart failure, unspecified: Secondary | ICD-10-CM | POA: Insufficient documentation

## 2014-12-10 DIAGNOSIS — Z8669 Personal history of other diseases of the nervous system and sense organs: Secondary | ICD-10-CM | POA: Insufficient documentation

## 2014-12-10 DIAGNOSIS — Z79899 Other long term (current) drug therapy: Secondary | ICD-10-CM | POA: Diagnosis not present

## 2014-12-10 DIAGNOSIS — F329 Major depressive disorder, single episode, unspecified: Secondary | ICD-10-CM | POA: Diagnosis not present

## 2014-12-10 DIAGNOSIS — Z8639 Personal history of other endocrine, nutritional and metabolic disease: Secondary | ICD-10-CM | POA: Diagnosis not present

## 2014-12-10 DIAGNOSIS — Z87891 Personal history of nicotine dependence: Secondary | ICD-10-CM | POA: Insufficient documentation

## 2014-12-10 DIAGNOSIS — J441 Chronic obstructive pulmonary disease with (acute) exacerbation: Secondary | ICD-10-CM | POA: Diagnosis not present

## 2014-12-10 DIAGNOSIS — I10 Essential (primary) hypertension: Secondary | ICD-10-CM | POA: Diagnosis not present

## 2014-12-10 DIAGNOSIS — K219 Gastro-esophageal reflux disease without esophagitis: Secondary | ICD-10-CM | POA: Diagnosis not present

## 2014-12-10 DIAGNOSIS — Z8781 Personal history of (healed) traumatic fracture: Secondary | ICD-10-CM | POA: Diagnosis not present

## 2014-12-10 DIAGNOSIS — Z8701 Personal history of pneumonia (recurrent): Secondary | ICD-10-CM | POA: Diagnosis not present

## 2014-12-10 DIAGNOSIS — F429 Obsessive-compulsive disorder, unspecified: Secondary | ICD-10-CM | POA: Insufficient documentation

## 2014-12-10 DIAGNOSIS — R0602 Shortness of breath: Secondary | ICD-10-CM | POA: Diagnosis present

## 2014-12-10 MED ORDER — PREDNISONE 20 MG PO TABS
60.0000 mg | ORAL_TABLET | Freq: Once | ORAL | Status: AC
  Administered 2014-12-10: 60 mg via ORAL
  Filled 2014-12-10: qty 3

## 2014-12-10 MED ORDER — PREDNISONE 10 MG PO TABS: 50.0000 mg | ORAL_TABLET | Freq: Every day | ORAL | Status: DC

## 2014-12-10 MED ORDER — IPRATROPIUM-ALBUTEROL 0.5-2.5 (3) MG/3ML IN SOLN
3.0000 mL | Freq: Once | RESPIRATORY_TRACT | Status: AC
  Administered 2014-12-10: 3 mL via RESPIRATORY_TRACT
  Filled 2014-12-10: qty 3

## 2014-12-10 MED ORDER — OXYCODONE-ACETAMINOPHEN 5-325 MG PO TABS
1.0000 | ORAL_TABLET | Freq: Once | ORAL | Status: AC
  Administered 2014-12-10: 1 via ORAL
  Filled 2014-12-10: qty 1

## 2014-12-10 NOTE — ED Notes (Signed)
Discharge instructions/prescription reviewed with patient. Understanding verbalized. Patient to be discharged to home via [facility].

## 2014-12-10 NOTE — ED Provider Notes (Signed)
CSN: [id number]     Arrival date & time 12/10/14  0346 History   By signing my name below, I, Kelsey Garcia, attest that this documentation has been prepared under the direction and in the presence of Leo Grosser, MD.  Electronically Signed: Forrestine Garcia, ED Scribe. 12/10/2014. 4:11 AM.   Chief Complaint  Patient presents with  . Shortness of Breath   Patient is a 69 y.o. female presenting with shortness of breath. The history is provided by the patient. No language interpreter was used.  Shortness of Breath Severity:  Moderate Onset quality:  Gradual Duration:  1 day Timing:  Constant Progression:  Unchanged Chronicity:  Chronic Relieved by:  Nothing Ineffective treatments:  Inhaler Associated symptoms: cough and wheezing   Associated symptoms: no abdominal pain, no chest pain, no fever, no headaches, no rash and no vomiting     HPI Comments: Kelsey Garcia brought in by EMS is a 69 y.o. female with a PMHx of A-Fib, hyperlipidemia, CHF, COPD, and GERD who presents to the Emergency Department complaining of constant, ongoing shortness of breath x 1 day, worsened in last several hours. Productive cough consisting of thick yellow/green mucous, chest soreness, and wheezing also reported. No aggravating or alleviating factors at this time. 3-4 duoneb treatments attempted at home without any improvement. No recent fever, chills, nausea, or vomiting. [name] states symptoms are some what baseline for her as she is often in and out of the hospital. Most recent hospitalization 1 month ago. Pt is on 2 liters of oxygen at home.  PCP: Darden Amber, PA    Past Medical History  Diagnosis Date  . Atrial fibrillation ([place])   . Hypertension   . Insomnia   . Nonischemic cardiomyopathy ([place])   . Hyperlipidemia   . Anxiety   . Cholelithiasis   . Insomnia   . Mitral regurgitation     noted 2010  . H/O epistaxis   . Rhinitis, allergic   . CHF (congestive heart failure) ([place])   . Pneumonia      "several times w/exacerbations of the COPD; nothing in the last year" (07/15/2012)  . COPD (chronic obstructive pulmonary disease) ([place])     as of 7/13 on 2-3L, pfts 10/2008 with mod obstruction  . Chronic bronchitis with COPD (chronic obstructive pulmonary disease) ([place])   . Shortness of breath     "all the time right now" (07/15/2012)  . GERD (gastroesophageal reflux disease)   . VHQIONGE(952.8)     "weekly" (07/15/2012)  . Migraines     "weekly for awhile; cleared up as I got older" (07/15/2012)  . DJD (degenerative joint disease)   . Arthritis     "all over" (07/15/2012)  . OCD (obsessive compulsive disorder)   . OCD (obsessive compulsive disorder)   . Depression     h/o SI; "last time I was really serious about it was ~ 1997" (07/15/2012)  . Thoracic vertebral fracture ([place]) 11/03/2014   Past Surgical History  Procedure Laterality Date  . Tubal ligation  1972  . Cardioversion  2003; 07/2003   Family History  Problem Relation Age of Onset  . Asthma    . Emphysema    . Allergies    . Cancer      aunt had several types of cancer  . COPD Mother   . Emphysema Mother   . Cirrhosis Father    Social History  Substance Use Topics  . Smoking status: Former Smoker -- 1.00 packs/day for 35  years    Types: Cigarettes    Quit date: 06/18/2014  . Smokeless tobacco: Never Used  . Alcohol Use: No   OB History    No data available     Review of Systems  Constitutional: Negative for fever and chills.  Respiratory: Positive for cough, shortness of breath and wheezing.   Cardiovascular: Negative for chest pain.  Gastrointestinal: Negative for nausea, vomiting and abdominal pain.  Genitourinary: Negative for dysuria.  Musculoskeletal: Negative for back pain.  Skin: Negative for rash.  Neurological: Negative for headaches.  Psychiatric/Behavioral: Negative for confusion.  All other systems reviewed and are negative.     Allergies  Ace inhibitors; Diphenhydramine hcl; Erythromycin;  Nsaids; Nyquil; Tramadol hcl; Codeine; Fosamax; Tomato; Orange fruit; and Pseudoephedrine  Home Medications   Prior to Admission medications   Medication Sig Start Date End Date Taking? Authorizing Provider  albuterol (PROVENTIL HFA;VENTOLIN HFA) 108 (90 BASE) MCG/ACT inhaler Inhale 1-2 puffs into the lungs every 6 (six) hours as needed for wheezing. 10/19/14   Rigoberto Noel, MD  albuterol (PROVENTIL) (2.5 MG/3ML) 0.083% nebulizer solution Take 3 mLs (2.5 mg total) by nebulization every 6 (six) hours as needed for wheezing. 09/21/14   Maryann Mikhail, DO  benzonatate (TESSALON) 200 MG capsule Take 1 capsule (200 mg total) by mouth 3 (three) times daily as needed for cough. Patient not taking: Reported on 11/30/2014 11/04/14   Ashly M Gottschalk, DO  busPIRone (BUSPAR) 10 MG tablet Take 10 mg by mouth 3 (three) times daily. 04/22/14   Historical Provider, MD  chlorpheniramine (CHLOR-TRIMETON) 4 MG tablet Take 1 tablet (4 mg total) by mouth every 8 (eight) hours as needed for allergies. Patient taking differently: Take 4 mg by mouth 3 (three) times daily.  11/13/13   Dellia Nims, MD  diltiazem (TIAZAC) 420 MG 24 hr capsule Take 1 capsule (420 mg total) by mouth daily. 05/09/14 08/08/15  Bartholomew Crews, MD  FLUoxetine (PROZAC) 20 MG capsule TAKE THREE CAPSULES BY MOUTH ONCE DAILY Patient taking differently: Take 60 mg by mouth daily.  01/03/14   Tasrif Ahmed, MD  furosemide (LASIX) 20 MG tablet Take 40 mg by mouth daily.     Historical Provider, MD  guaiFENesin (MUCINEX) 600 MG 12 hr tablet Take 1 tablet (600 mg total) by mouth 2 (two) times daily. Patient taking differently: Take 600 mg by mouth 2 (two) times daily as needed for cough.  04/28/14   Tanna Furry, MD  HYDROcodone-homatropine [facility]) 5-1.5 MG/5ML syrup Take 5 mLs by mouth every 6 (six) hours as needed for cough.    Historical Provider, MD  hydroxypropyl methylcellulose / hypromellose (ISOPTO TEARS / GONIOVISC) 2.5 % ophthalmic solution  Place 1 drop into both eyes daily as needed for dry eyes (dry eyes).     Historical Provider, MD  ipratropium-albuterol (DUONEB) 0.5-2.5 (3) MG/3ML SOLN Take 3 mLs by nebulization 3 (three) times daily. 09/21/14   Maryann Mikhail, DO  KLOR-CON M20 20 MEQ tablet Take 1 tablet by mouth 3 (three) times daily.  09/26/14   Historical Provider, MD  metoprolol succinate (TOPROL-XL) 25 MG 24 hr tablet Take 0.5 tablets (12.5 mg total) by mouth daily after lunch. 07/02/14   Arnoldo Morale, MD  nystatin (MYCOSTATIN) 100000 UNIT/ML suspension Take 5 mLs (500,000 Units total) by mouth as needed. Patient taking differently: Take 500,000 Units by mouth daily as needed (thrush).  04/16/14   Dellia Nims, MD  oxyCODONE-acetaminophen (PERCOCET/ROXICET) 5-325 MG per tablet Take 1 tablet by mouth  every 8 (eight) hours as needed for severe pain.     Historical Provider, MD  pantoprazole (PROTONIX) 40 MG tablet TAKE ONE TABLET BY MOUTH TWICE DAILY Patient taking differently: Take 40 mg by mouth 2 (two) times daily.  01/03/14   Dellia Nims, MD  predniSONE (DELTASONE) 20 MG tablet Take 2 tablets (40 mg total) by mouth daily with breakfast. Patient not taking: Reported on 11/06/2014 11/04/14   [name], DO  SYMBICORT 160-4.5 MCG/ACT inhaler INHALE TWO PUFFS BY MOUTH TWICE DAILY 11/06/14   Rigoberto Noel, MD  tiotropium (SPIRIVA) 18 MCG inhalation capsule Place 1 capsule (18 mcg total) into inhaler and inhale daily. 08/07/14   Rigoberto Noel, MD   Triage Vitals: BP 127/55 mmHg  Pulse 90  Temp(Src) 97.9 F (36.6 C) (Oral)  Resp 23  SpO2 100%   Physical Exam  Constitutional: She is oriented to person, place, and time. She appears well-developed and well-nourished. No distress.  HENT:  Head: Normocephalic and atraumatic.  Eyes: EOM are normal.  Neck: Normal range of motion.  Cardiovascular: Normal rate, regular rhythm and normal heart sounds.   Pulmonary/Chest: Effort normal. She has wheezes.  Course bilaterally  inspiratory and expiratory wheezing  Abdominal: Soft. She exhibits no distension. There is no tenderness.  Musculoskeletal: Normal range of motion.  Neurological: She is alert and oriented to person, place, and time.  Skin: Skin is warm and dry.  Psychiatric: She has a normal mood and affect. Judgment normal.  Nursing note and vitals reviewed.   ED Course  Procedures (including critical care time)  DIAGNOSTIC STUDIES: Oxygen Saturation is 100% on RA, Normal by my interpretation.    COORDINATION OF CARE: 3:58 AM- Will give Prednisone. Discussed treatment plan with pt at bedside and pt agreed to plan.     Labs Review Labs Reviewed - No data to display  Imaging Review No results found. I have personally reviewed and evaluated these images and lab results as part of my medical decision-making.   EKG Interpretation   Date/Time:  Monday December 10 2014 03:52:52 EDT Ventricular Rate:  86 PR Interval:    QRS Duration: 97 QT Interval:  376 QTC Calculation: 450 R Axis:   -26 Text Interpretation:  Atrial fibrillation Borderline left axis deviation  Minimal ST depression, inferior leads Baseline wander in lead(s) V2 No  significant change since last tracing Confirmed by Jaydis Duchene MD, Roseanne Juenger  ([id number]) on 12/10/2014 4:27:52 AM      MDM   Final diagnoses:  COPD exacerbation ([place])    69 year old female presents with typical COPD exacerbation which started yesterday in the setting of multiple family members with upper respiratory infections. She is afebrile and not having any significant laboring to breathe on arrival. She does have inspiratory and expiratory wheezing which appears to be near her baseline. She is on her home 2 L of oxygen without any hypoxemia. Has breathing treatments available at home. Given oral steroids here to start therapy for possible exacerbation. Will likely be discharged with primary care physician follow-up as needed.  I personally performed the services  described in this documentation, which was scribed in my presence. The recorded information has been reviewed and is accurate.      Leo Grosser, MD 12/10/14 [phone]

## 2014-12-10 NOTE — ED Notes (Signed)
Phlebotomy at bedside.

## 2014-12-10 NOTE — Discharge Instructions (Signed)
Chronic Obstructive Pulmonary Disease Chronic obstructive pulmonary disease (COPD) is a common lung condition in which airflow from the lungs is limited. COPD is a general term that can be used to describe many different lung problems that limit airflow, including both chronic bronchitis and emphysema. If you have COPD, your lung function will probably never return to normal, but there are measures you can take to improve lung function and make yourself feel better. CAUSES   Smoking (common).  Exposure to secondhand smoke.  Genetic problems.  Chronic inflammatory lung diseases or recurrent infections. SYMPTOMS  Shortness of breath, especially with physical activity.  Deep, persistent (chronic) cough with a large amount of thick mucus.  Wheezing.  Rapid breaths (tachypnea).  Gray or bluish discoloration (cyanosis) of the skin, especially in your fingers, toes, or lips.  Fatigue.  Weight loss.  Frequent infections or episodes when breathing symptoms become much worse (exacerbations).  Chest tightness. DIAGNOSIS Your health care provider will take a medical history and perform a physical examination to diagnose COPD. Additional tests for COPD may include:  Lung (pulmonary) function tests.  Chest X-ray.  CT scan.  Blood tests. TREATMENT  Treatment for COPD may include:  Inhaler and nebulizer medicines. These help manage the symptoms of COPD and make your breathing more comfortable.  Supplemental oxygen. Supplemental oxygen is only helpful if you have a low oxygen level in your blood.  Exercise and physical activity. These are beneficial for nearly all people with COPD.  Lung surgery or transplant.  Nutrition therapy to gain weight, if you are underweight.  Pulmonary rehabilitation. This may involve working with a team of health care providers and specialists, such as respiratory, occupational, and physical therapists. HOME CARE INSTRUCTIONS  Take all medicines  (inhaled or pills) as directed by your health care provider.  Avoid over-the-counter medicines or cough syrups that dry up your airway (such as antihistamines) and slow down the elimination of secretions unless instructed otherwise by your health care provider.  If you are a smoker, the most important thing that you can do is stop smoking. Continuing to smoke will cause further lung damage and breathing trouble. Ask your health care provider for help with quitting smoking. He or she can direct you to community resources or hospitals that provide support.  Avoid exposure to irritants such as smoke, chemicals, and fumes that aggravate your breathing.  Use oxygen therapy and pulmonary rehabilitation if directed by your health care provider. If you require home oxygen therapy, ask your health care provider whether you should purchase a pulse oximeter to measure your oxygen level at home.  Avoid contact with individuals who have a contagious illness.  Avoid extreme temperature and humidity changes.  Eat healthy foods. Eating smaller, more frequent meals and resting before meals may help you maintain your strength.  Stay active, but balance activity with periods of rest. Exercise and physical activity will help you maintain your ability to do things you want to do.  Preventing infection and hospitalization is very important when you have COPD. Make sure to receive all the vaccines your health care provider recommends, especially the pneumococcal and influenza vaccines. Ask your health care provider whether you need a pneumonia vaccine.  Learn and use relaxation techniques to manage stress.  Learn and use controlled breathing techniques as directed by your health care provider. Controlled breathing techniques include:  Pursed lip breathing. Start by breathing in (inhaling) through your nose for 1 second. Then, purse your lips as if you were   going to whistle and breathe out (exhale) through the  pursed lips for 2 seconds.  Diaphragmatic breathing. Start by putting one hand on your abdomen just above your waist. Inhale slowly through your nose. The hand on your abdomen should move out. Then purse your lips and exhale slowly. You should be able to feel the hand on your abdomen moving in as you exhale.  Learn and use controlled coughing to clear mucus from your lungs. Controlled coughing is a series of short, progressive coughs. The steps of controlled coughing are: 1. Lean your head slightly forward. 2. Breathe in deeply using diaphragmatic breathing. 3. Try to hold your breath for 3 seconds. 4. Keep your mouth slightly open while coughing twice. 5. Spit any mucus out into a tissue. 6. Rest and repeat the steps once or twice as needed. SEEK MEDICAL CARE IF:  You are coughing up more mucus than usual.  There is a change in the color or thickness of your mucus.  Your breathing is more labored than usual.  Your breathing is faster than usual. SEEK IMMEDIATE MEDICAL CARE IF:  You have shortness of breath while you are resting.  You have shortness of breath that prevents you from:  Being able to talk.  Performing your usual physical activities.  You have chest pain lasting longer than 5 minutes.  Your skin color is more cyanotic than usual.  You measure low oxygen saturations for longer than 5 minutes with a pulse oximeter. MAKE SURE YOU:  Understand these instructions.  Will watch your condition.  Will get help right away if you are not doing well or get worse.   This information is not intended to replace advice given to you by your health care provider. Make sure you discuss any questions you have with your health care provider.   Document Released: 11/05/2004 Document Revised: 02/16/2014 Document Reviewed: 09/22/2012 Elsevier Interactive Patient Education 2016 [employer]  

## 2014-12-10 NOTE — ED Notes (Signed)
Patient arrived via [facility]. EMS reports patient called 911 reporting shortness of breath, productive cough with thick yellow/green sputum, and chest pain/soreness r/t coughing. Wheezing throughout. Patient typically takes 3-4 duoneb treatments at home, but had administered 5 with no relief. EMS administered duoneb treatment with 5 Albuterol and 0.5 Atrovent. BP 119/64, Pulse 80.

## 2014-12-10 NOTE — ED Notes (Signed)
Reviewed discharge instructions/prescription with patient. Waiting for [facility] to arrive to transport patient home.

## 2014-12-10 NOTE — ED Notes (Signed)
[facility] CALLED '@[id number]'$ .

## 2014-12-10 NOTE — ED Notes (Signed)
Dr. Laneta Simmers at bedside at this time.

## 2014-12-10 NOTE — ED Notes (Signed)
Up to the bedside commode.  resp therapy here to give hhn

## 2014-12-20 ENCOUNTER — Encounter: Payer: Self-pay | Admitting: *Deleted

## 2014-12-24 ENCOUNTER — Ambulatory Visit: Payer: Medicare Other | Admitting: Cardiovascular Disease

## 2015-01-10 NOTE — Progress Notes (Signed)
Patient ID: Kelsey Garcia, female   DOB: 1945-07-07, 69 y.o.   MRN: [id number]   Kelsey Garcia is a 69 y.o.  female who returns for follow up on CHF,   She is prior patient of Dr. Doreatha Lew  Hospitalized in  09/2010 for AFib. She has a hx of permanent AFib, s/p 2 failed DCCVs, diastolic CHF, COPD, HTN, HL, tobacco abuse, OCD. Echo in 2010 with normal LVF. Anticoagulation recommended  but patient declined (CHADS2-VASc=4). Last echo (07/2012): Normal LV wall thickness and motion, EF 60-65%, mild RAE. She was recently seen in follow up with her PCP and noted to have worsening LE edema. Lasix was increased with improvement according to the notes in her chart.  Patient notes her LE edema is improved since taking Lasix 80 bid. She has been trying to lose weight over the last 1 year (lost 30lbs). Also exercising at home. Denies exertional CP. Notes DOE that is chronic. Seems to be worse. She is NYHA Class IIb. No orthopnea or PND. No syncope.  On home oxygen for 4 years  Seeing Dr [name] next month.  Had pneumonia shot but routinely avoids flu shot  Labs (6/14): BNP 1190, TSH 1.098  Labs (8/14): K 3.5, Cr 1.06, HDL 43, LDL 117, Hgb 13.8  Recent hospitalizationfor respitory insuf.  Mild volume overload BNP on 06/25/14 was 441   On oxygen 2L continuous  CT 05/20/14 reviewed and no PE With interstitial lung changes  Not smoked since d/c  Kicked out of family practice had disagreement with Dr Lynnae January.  Issues with pain management  Echo:  03/07/14 Study Conclusions  - Left ventricle: The cavity size was normal. Wall thickness was normal. Systolic function was at the lower limits of normal. The estimated ejection fraction was in the range of 50% to 55%. Although no diagnostic regional wall motion abnormality was identified, this possibility cannot be completely excluded on the basis of this study. - Left atrium: The atrium was mildly dilated. - Right atrium: The atrium was mildly dilated. - Pulmonary  arteries: PA peak pressure: 32 mm Hg (S).  ROS: Denies fever, malais, weight loss, blurry vision, decreased visual acuity, cough, sputum,  hemoptysis, pleuritic pain, palpitaitons, heartburn, abdominal pain, melena, lower extremity edema, claudication, or rash.  All other systems reviewed and negative  General: Affect appropriate Chronically ill white female with oxygen HEENT: normal Neck supple with no adenopathy JVP normal no bruits no thyromegaly Lungs expitory wheezing and good diaphragmatic motion Heart:  S1/S2 no murmur, no rub, gallop or click PMI normal Abdomen: benighn, BS positve, no tenderness, no AAA no bruit.  No HSM or HJR Distal pulses intact with no bruits No edema Neuro non-focal Skin warm and dry No muscular weakness   Current Outpatient Prescriptions  Medication Sig Dispense Refill  . albuterol (PROVENTIL HFA;VENTOLIN HFA) 108 (90 BASE) MCG/ACT inhaler Inhale 1-2 puffs into the lungs every 6 (six) hours as needed for wheezing. 1 Inhaler 3  . albuterol (PROVENTIL) (2.5 MG/3ML) 0.083% nebulizer solution Take 3 mLs (2.5 mg total) by nebulization every 6 (six) hours as needed for wheezing. 75 mL 1  . busPIRone (BUSPAR) 10 MG tablet Take 10 mg by mouth 3 (three) times daily.    . chlorpheniramine (CHLOR-TRIMETON) 4 MG tablet Take 4 mg by mouth 3 (three) times daily.    Marland Kitchen diltiazem (TIAZAC) 420 MG 24 hr capsule Take 1 capsule (420 mg total) by mouth daily. 90 capsule 0  . FLUoxetine (PROZAC) 20 MG capsule  TAKE THREE CAPSULES BY MOUTH ONCE DAILY 90 capsule 5  . furosemide (LASIX) 20 MG tablet Take 40 mg by mouth daily.     Marland Kitchen guaiFENesin (MUCINEX) 600 MG 12 hr tablet Take 600 mg by mouth 2 (two) times daily as needed for cough.    Marland Kitchen HYDROcodone-homatropine (HYCODAN) 5-1.5 MG/5ML syrup Take 5 mLs by mouth every 6 (six) hours as needed for cough.    . hydroxypropyl methylcellulose / hypromellose (ISOPTO TEARS / GONIOVISC) 2.5 % ophthalmic solution Place 1 drop into both eyes  daily as needed for dry eyes (dry eyes).     Marland Kitchen ipratropium-albuterol (DUONEB) 0.5-2.5 (3) MG/3ML SOLN Take 3 mLs by nebulization 3 (three) times daily. 360 mL 3  . KLOR-CON M20 20 MEQ tablet Take 1 tablet by mouth 3 (three) times daily.     . metoprolol succinate (TOPROL-XL) 25 MG 24 hr tablet Take 0.5 tablets (12.5 mg total) by mouth daily after lunch. 30 tablet 3  . nystatin (MYCOSTATIN) 100000 UNIT/ML suspension Take 5 mLs by mouth daily as needed. FOR THRUSH    . oxyCODONE-acetaminophen (PERCOCET/ROXICET) 5-325 MG per tablet Take 1 tablet by mouth every 8 (eight) hours as needed for severe pain.     . pantoprazole (PROTONIX) 40 MG tablet TAKE ONE TABLET BY MOUTH TWICE DAILY 60 tablet 5  . predniSONE (DELTASONE) 10 MG tablet Take 5 tablets (50 mg total) by mouth daily. 20 tablet 0  . SYMBICORT 160-4.5 MCG/ACT inhaler INHALE TWO PUFFS BY MOUTH TWICE DAILY 10.2 g 2  . tiotropium (SPIRIVA) 18 MCG inhalation capsule Place 1 capsule (18 mcg total) into inhaler and inhale daily. 90 capsule 1   No current facility-administered medications for this visit.    Allergies  Ace inhibitors; Diphenhydramine hcl; Erythromycin; Nsaids; Nyquil; Tramadol hcl; Codeine; Fosamax; Tomato; Orange fruit; and Pseudoephedrine  Electrocardiogram:  afib nonspecific ST/T wave changes  03/01/13  1/21`/16  Afib  rate 70  Poor R wave progression no significant change   Assessment and Plan COPD:  Improved continue oxygen f/u Dr [name] Afib:  Affirmed she does not want anticoagulation.  Add toprol 25 mg in afternoon for rate control  Not actively wheezing Pain:  Encouraged her to see pain clinic to deal with her scripts since Dr Lynnae January not seeing her anymore   Jenkins Rouge

## 2015-01-15 ENCOUNTER — Encounter: Payer: Medicare Other | Admitting: Cardiovascular Disease

## 2015-01-21 ENCOUNTER — Emergency Department (HOSPITAL_COMMUNITY): Payer: Medicare Other

## 2015-01-21 ENCOUNTER — Emergency Department (HOSPITAL_COMMUNITY)
Admission: EM | Admit: 2015-01-21 | Discharge: 2015-01-21 | Disposition: A | Discharge status: Home / Self Care | Payer: Medicare Other | Attending: Emergency Medicine | Admitting: Emergency Medicine

## 2015-01-21 ENCOUNTER — Encounter (HOSPITAL_COMMUNITY): Payer: Self-pay | Admitting: Nurse Practitioner

## 2015-01-21 DIAGNOSIS — I10 Essential (primary) hypertension: Secondary | ICD-10-CM | POA: Diagnosis not present

## 2015-01-21 DIAGNOSIS — M1990 Unspecified osteoarthritis, unspecified site: Secondary | ICD-10-CM | POA: Diagnosis not present

## 2015-01-21 DIAGNOSIS — F419 Anxiety disorder, unspecified: Secondary | ICD-10-CM | POA: Insufficient documentation

## 2015-01-21 DIAGNOSIS — Z8639 Personal history of other endocrine, nutritional and metabolic disease: Secondary | ICD-10-CM | POA: Insufficient documentation

## 2015-01-21 DIAGNOSIS — I509 Heart failure, unspecified: Secondary | ICD-10-CM | POA: Diagnosis not present

## 2015-01-21 DIAGNOSIS — I4891 Unspecified atrial fibrillation: Secondary | ICD-10-CM | POA: Diagnosis not present

## 2015-01-21 DIAGNOSIS — J189 Pneumonia, unspecified organism: Secondary | ICD-10-CM

## 2015-01-21 DIAGNOSIS — F429 Obsessive-compulsive disorder, unspecified: Secondary | ICD-10-CM | POA: Insufficient documentation

## 2015-01-21 DIAGNOSIS — R05 Cough: Secondary | ICD-10-CM | POA: Diagnosis present

## 2015-01-21 DIAGNOSIS — K219 Gastro-esophageal reflux disease without esophagitis: Secondary | ICD-10-CM | POA: Insufficient documentation

## 2015-01-21 DIAGNOSIS — F329 Major depressive disorder, single episode, unspecified: Secondary | ICD-10-CM | POA: Diagnosis not present

## 2015-01-21 DIAGNOSIS — G43909 Migraine, unspecified, not intractable, without status migrainosus: Secondary | ICD-10-CM | POA: Diagnosis not present

## 2015-01-21 DIAGNOSIS — Z7952 Long term (current) use of systemic steroids: Secondary | ICD-10-CM | POA: Diagnosis not present

## 2015-01-21 DIAGNOSIS — J159 Unspecified bacterial pneumonia: Secondary | ICD-10-CM | POA: Diagnosis not present

## 2015-01-21 DIAGNOSIS — Z79899 Other long term (current) drug therapy: Secondary | ICD-10-CM | POA: Diagnosis not present

## 2015-01-21 DIAGNOSIS — J441 Chronic obstructive pulmonary disease with (acute) exacerbation: Secondary | ICD-10-CM | POA: Insufficient documentation

## 2015-01-21 DIAGNOSIS — Z8781 Personal history of (healed) traumatic fracture: Secondary | ICD-10-CM | POA: Diagnosis not present

## 2015-01-21 DIAGNOSIS — Z87891 Personal history of nicotine dependence: Secondary | ICD-10-CM | POA: Diagnosis not present

## 2015-01-21 MED ORDER — DOXYCYCLINE HYCLATE 100 MG PO TABS
100.0000 mg | ORAL_TABLET | Freq: Once | ORAL | Status: AC
  Administered 2015-01-21: 100 mg via ORAL
  Filled 2015-01-21: qty 1

## 2015-01-21 MED ORDER — DOXYCYCLINE HYCLATE 100 MG PO CAPS: 100.0000 mg | ORAL_CAPSULE | Freq: Two times a day (BID) | ORAL | Status: DC

## 2015-01-21 NOTE — Discharge Instructions (Signed)
Community-Acquired Pneumonia, Adult Follow-up with her primary care doctor in 2 days. You may need a repeat x-ray of your chest in 3-4 weeks. Take antibiotics as prescribed. Return for worsening shortness of breath or fever. Pneumonia is an infection of the lungs. There are different types of pneumonia. One type can develop while a person is in a hospital. A different type, called community-acquired pneumonia, develops in people who are not, or have not recently been, in the hospital or other health care facility.  CAUSES Pneumonia may be caused by bacteria, viruses, or funguses. Community-acquired pneumonia is often caused by Streptococcus pneumonia bacteria. These bacteria are often passed from one person to another by breathing in droplets from the cough or sneeze of an infected person. RISK FACTORS The condition is more likely to develop in:  People who havechronic diseases, such as chronic obstructive pulmonary disease (COPD), asthma, congestive heart failure, cystic fibrosis, diabetes, or kidney disease.  People who haveearly-stage or late-stage HIV.  People who havesickle cell disease.  People who havehad their spleen removed (splenectomy).  People who havepoor Human resources officer.  People who havemedical conditions that increase the risk of breathing in (aspirating) secretions their own mouth and nose.   People who havea weakened immune system (immunocompromised).  People who smoke.  People whotravel to areas where pneumonia-causing germs commonly exist.  People whoare around animal habitats or animals that have pneumonia-causing germs, including birds, bats, rabbits, cats, and farm animals. SYMPTOMS Symptoms of this condition include:  Adry cough.  A wet (productive) cough.  Fever.  Sweating.  Chest pain, especially when breathing deeply or coughing.  Rapid breathing or difficulty breathing.  Shortness of breath.  Shaking chills.  Fatigue.  Muscle  aches. DIAGNOSIS Your health care provider will take a medical history and perform a physical exam. You may also have other tests, including:  Imaging studies of your chest, including X-rays.  Tests to check your blood oxygen level and other blood gases.  Other tests on blood, mucus (sputum), fluid around your lungs (pleural fluid), and urine. If your pneumonia is severe, other tests may be done to identify the specific cause of your illness. TREATMENT The type of treatment that you receive depends on many factors, such as the cause of your pneumonia, the medicines you take, and other medical conditions that you have. For most adults, treatment and recovery from pneumonia may occur at home. In some cases, treatment must happen in a hospital. Treatment may include:  Antibiotic medicines, if the pneumonia was caused by bacteria.  Antiviral medicines, if the pneumonia was caused by a virus.  Medicines that are given by mouth or through an IV tube.  Oxygen.  Respiratory therapy. Although rare, treating severe pneumonia may include:  Mechanical ventilation. This is done if you are not breathing well on your own and you cannot maintain a safe blood oxygen level.  Thoracentesis. This procedureremoves fluid around one lung or both lungs to help you breathe better. HOME CARE INSTRUCTIONS  Take over-the-counter and prescription medicines only as told by your health care provider.  Only takecough medicine if you are losing sleep. Understand that cough medicine can prevent your body's natural ability to remove mucus from your lungs.  If you were prescribed an antibiotic medicine, take it as told by your health care provider. Do not stop taking the antibiotic even if you start to feel better.  Sleep in a semi-upright position at night. Try sleeping in a reclining chair, or place a few pillows  under your head.  Do not use tobacco products, including cigarettes, chewing tobacco, and  e-cigarettes. If you need help quitting, ask your health care provider.  Drink enough water to keep your urine clear or pale yellow. This will help to thin out mucus secretions in your lungs. PREVENTION There are ways that you can decrease your risk of developing community-acquired pneumonia. Consider getting a pneumococcal vaccine if:  You are older than 68 years of age.  You are older than 68 years of age and are undergoing cancer treatment, have chronic lung disease, or have other medical conditions that affect your immune system. Ask your health care provider if this applies to you. There are different types and schedules of pneumococcal vaccines. Ask your health care provider which vaccination option is best for you. You may also prevent community-acquired pneumonia if you take these actions:  Get an influenza vaccine every year. Ask your health care provider which type of influenza vaccine is best for you.  Go to the dentist on a regular basis.  Wash your hands often. Use hand sanitizer if soap and water are not available. SEEK MEDICAL CARE IF:  You have a fever.  You are losing sleep because you cannot control your cough with cough medicine. SEEK IMMEDIATE MEDICAL CARE IF:  You have worsening shortness of breath.  You have increased chest pain.  Your sickness becomes worse, especially if you are an older adult or have a weakened immune system.  You cough up blood.   This information is not intended to replace advice given to you by your health care provider. Make sure you discuss any questions you have with your health care provider.   Document Released: 01/26/2005 Document Revised: 10/17/2014 Document Reviewed: 05/23/2014 Elsevier Interactive Patient Education [employer].

## 2015-01-21 NOTE — ED Notes (Signed)
Waiting on [facility] to transport home.

## 2015-01-21 NOTE — Discharge Planning (Signed)
NCM consulted to assist with ride home.  Pt on oxygen; NCM provided taxi voucher to RN to be completed when taxi arrives.

## 2015-01-21 NOTE — Progress Notes (Signed)
[facility] transport arranged on behalf of pt.

## 2015-01-21 NOTE — Care Management Note (Signed)
Case Management Note  Patient Details  Name: Kelsey Garcia MRN: [id number] Date of Birth: May 21, 1945  Subjective/Objective:                  69 y.o. female with hx atrial fibrillation, HLD, CHF (on 20 mg Lasix), and COPD (on 2L oxygen at home) who presents to the Emergency Department complaining of worsening shortness of breath and cough x 1 week. She has not had to increase her O2 at home for her symptoms. Pt also complains of wheezing and rattling in chest.//Home alone.  Action/Plan: Follow for disposition needs.   Expected Discharge Date:     01/21/15             Expected Discharge Plan:  Home/Self Care  In-House Referral:  Clinical Social Work  Discharge planning Services  CM Consult  Post Acute Care Choice:  Durable Medical Equipment Choice offered to:  Patient  DME Arranged:  Oxygen DME Agency:  [place]:  NA [place] Agency:  NA  Status of Service:  Completed, signed off  Medicare Important Message Given:    Date Medicare IM Given:    Medicare IM give by:    Date Additional Medicare IM Given:    Additional Medicare Important Message give by:     If discussed at [place] of Stay Meetings, dates discussed:    Additional Comments: Fuller Mandril, RN, BSN, [place] [phone]. Pt currently has DME oxygen with [facility] but came to ER without travel tank.  NCM consulted to assist with obtaining oxygen prior to d/c home.  Melene Muller of [facility] notified to deliver oxygen to pt room prior to D/C home.  Fuller Mandril, RN 01/21/2015, 2:06 PM

## 2015-01-21 NOTE — ED Provider Notes (Signed)
CSN: [id number]     Arrival date & time 01/21/15  1134 History  By signing my name below, I, Eustaquio Maize, attest that this documentation has been prepared under the direction and in the presence of [employer], PA-C. Electronically Signed: Eustaquio Maize, ED Scribe. 01/21/2015. 1:45 PM.  Chief Complaint  Patient presents with  . Cough   The history is provided by the patient. No language interpreter was used.     HPI Comments: Kelsey Garcia is a 69 y.o. female with hx atrial fibrillation, HLD, CHF (on 20 mg Lasix), and COPD (on 2L oxygen at home) who presents to the Emergency Department complaining of worsening shortness of breath and cough x 1 week. She has not had to increase her O2 at home for her symptoms. Pt also complains of wheezing and rattling in chest. She also reports a fever 4 days ago that has since resolved on its own. Pt has hx of recurrent pneumonia and was treated about 1 month ago for the same. She mentions that doxycycline and cefdinir usually works well for her pneumonia. Denies any other associated symptoms. Pt is former smoker who quit 12/10/2014 (1.5 months ago). Pt is not currently on any anticoagulants. She takes #2 324 mg Aspirin per day for her atrial fibrillation. Pt is also currently requesting Oxycodone in the ED because she is a "pain addict" and needs some. She is currently a patient at a pain clinic. Per chart review, pt received 1 tablet on 07/31/2014 while in the ED.   Past Medical History  Diagnosis Date  . Atrial fibrillation ([place])   . Hypertension   . Insomnia   . Nonischemic cardiomyopathy ([place])   . Hyperlipidemia   . Anxiety   . Cholelithiasis   . Insomnia   . Mitral regurgitation     noted 2010  . H/O epistaxis   . Rhinitis, allergic   . CHF (congestive heart failure) ([place])   . Pneumonia     "several times w/exacerbations of the COPD; nothing in the last year" (07/15/2012)  . COPD (chronic obstructive pulmonary disease) ([place])     as of  7/13 on 2-3L, pfts 10/2008 with mod obstruction  . Chronic bronchitis with COPD (chronic obstructive pulmonary disease) ([place])   . Shortness of breath     "all the time right now" (07/15/2012)  . GERD (gastroesophageal reflux disease)   . PQZRAQTM(226.3)     "weekly" (07/15/2012)  . Migraines     "weekly for awhile; cleared up as I got older" (07/15/2012)  . DJD (degenerative joint disease)   . Arthritis     "all over" (07/15/2012)  . OCD (obsessive compulsive disorder)   . OCD (obsessive compulsive disorder)   . Depression     h/o SI; "last time I was really serious about it was ~ 1997" (07/15/2012)  . Thoracic vertebral fracture ([place]) 11/03/2014   Past Surgical History  Procedure Laterality Date  . Tubal ligation  1972  . Cardioversion  2003; 07/2003   Family History  Problem Relation Age of Onset  . Asthma    . Emphysema    . Allergies    . Cancer      aunt had several types of cancer  . COPD Mother   . Emphysema Mother   . Cirrhosis Father    Social History  Substance Use Topics  . Smoking status: Former Smoker -- 1.00 packs/day for 35 years    Types: Cigarettes    Quit date: 06/18/2014  .  Smokeless tobacco: Never Used  . Alcohol Use: No   OB History    No data available     Review of Systems  Constitutional: Positive for fever. Negative for chills.  Respiratory: Positive for cough, shortness of breath and wheezing.   All other systems reviewed and are negative.  Allergies  Ace inhibitors; Diphenhydramine hcl; Erythromycin; Nsaids; Nyquil; Tramadol hcl; Codeine; Fosamax; Tomato; Orange fruit; and Pseudoephedrine  Home Medications   Prior to Admission medications   Medication Sig Start Date End Date Taking? Authorizing Provider  albuterol (PROVENTIL HFA;VENTOLIN HFA) 108 (90 BASE) MCG/ACT inhaler Inhale 1-2 puffs into the lungs every 6 (six) hours as needed for wheezing. 10/19/14   Rigoberto Noel, MD  albuterol (PROVENTIL) (2.5 MG/3ML) 0.083% nebulizer solution Take 3 mLs  (2.5 mg total) by nebulization every 6 (six) hours as needed for wheezing. 09/21/14   Maryann Mikhail, DO  busPIRone (BUSPAR) 10 MG tablet Take 10 mg by mouth 3 (three) times daily. 04/22/14   Historical Provider, MD  chlorpheniramine (CHLOR-TRIMETON) 4 MG tablet Take 4 mg by mouth 3 (three) times daily.    Historical Provider, MD  diltiazem (TIAZAC) 420 MG 24 hr capsule Take 1 capsule (420 mg total) by mouth daily. 05/09/14 08/08/15  Bartholomew Crews, MD  doxycycline (VIBRAMYCIN) 100 MG capsule Take 1 capsule (100 mg total) by mouth 2 (two) times daily. 01/21/15   Amaira Safley Patel-Mills, PA-C  FLUoxetine (PROZAC) 20 MG capsule TAKE THREE CAPSULES BY MOUTH ONCE DAILY 01/03/14   Dellia Nims, MD  furosemide (LASIX) 20 MG tablet Take 40 mg by mouth daily.     Historical Provider, MD  guaiFENesin (MUCINEX) 600 MG 12 hr tablet Take 600 mg by mouth 2 (two) times daily as needed for cough.    Historical Provider, MD  HYDROcodone-homatropine (HYCODAN) 5-1.5 MG/5ML syrup Take 5 mLs by mouth every 6 (six) hours as needed for cough.    Historical Provider, MD  hydroxypropyl methylcellulose / hypromellose (ISOPTO TEARS / GONIOVISC) 2.5 % ophthalmic solution Place 1 drop into both eyes daily as needed for dry eyes (dry eyes).     Historical Provider, MD  ipratropium-albuterol (DUONEB) 0.5-2.5 (3) MG/3ML SOLN Take 3 mLs by nebulization 3 (three) times daily. 09/21/14   Maryann Mikhail, DO  KLOR-CON M20 20 MEQ tablet Take 1 tablet by mouth 3 (three) times daily.  09/26/14   Historical Provider, MD  metoprolol succinate (TOPROL-XL) 25 MG 24 hr tablet Take 0.5 tablets (12.5 mg total) by mouth daily after lunch. 07/02/14   Arnoldo Morale, MD  nystatin (MYCOSTATIN) 100000 UNIT/ML suspension Take 5 mLs by mouth daily as needed. FOR THRUSH 09/28/14   Historical Provider, MD  oxyCODONE-acetaminophen (PERCOCET/ROXICET) 5-325 MG per tablet Take 1 tablet by mouth every 8 (eight) hours as needed for severe pain.     Historical Provider,  MD  pantoprazole (PROTONIX) 40 MG tablet TAKE ONE TABLET BY MOUTH TWICE DAILY 01/03/14   Dellia Nims, MD  predniSONE (DELTASONE) 10 MG tablet Take 5 tablets (50 mg total) by mouth daily. 12/11/14   Leo Grosser, MD  SYMBICORT 160-4.5 MCG/ACT inhaler INHALE TWO PUFFS BY MOUTH TWICE DAILY 11/06/14   Rigoberto Noel, MD  tiotropium (SPIRIVA) 18 MCG inhalation capsule Place 1 capsule (18 mcg total) into inhaler and inhale daily. 08/07/14   Rigoberto Noel, MD   Triage Vitals: BP 95/64 mmHg  Pulse 64  Temp(Src) 98.6 F (37 C) (Oral)  Resp 18  SpO2 98%   Physical Exam  Constitutional: She is oriented to person, place, and time. She appears well-developed and well-nourished. No distress.  HENT:  Head: Normocephalic and atraumatic.  Eyes: Conjunctivae and EOM are normal.  Neck: Neck supple. No tracheal deviation present.  Cardiovascular: An irregularly irregular rhythm present.  Pulmonary/Chest: Effort normal. No respiratory distress. She has no wheezes. She has rhonchi. She has no rales.  Left sided rhonchi Pt is on 2L O2 at bedside but running at 98%. No respiratory distress or difficulty breathing. No use of accessory muscles. Speaking full sentences.  Musculoskeletal: Normal range of motion.  Neurological: She is alert and oriented to person, place, and time.  Skin: Skin is warm and dry.  Psychiatric: She has a normal mood and affect. Her behavior is normal.  Nursing note and vitals reviewed.   ED Course  Procedures (including critical care time)  DIAGNOSTIC STUDIES: Oxygen Saturation is 98% on [place], normal by my interpretation.    COORDINATION OF CARE: 1:38 PM-Discussed treatment plan which includes Rx antibiotics with pt at bedside and pt agreed to plan.   Labs Review Labs Reviewed - No data to display  Imaging Review Dg Chest 2 View  01/21/2015  CLINICAL DATA:  Shortness of breath EXAM: CHEST  2 VIEW COMPARISON:  11/06/2014 FINDINGS: Airspace disease noted in the left lingula  concerning for pneumonia. Right lung is clear. No effusions. Heart is borderline in size. IMPRESSION: Lingular airspace opacity concerning for pneumonia. Followup PA and lateral chest X-ray is recommended in 3-4 weeks following trial of antibiotic therapy to ensure resolution and exclude underlying malignancy. Electronically Signed   By: Rolm Baptise M.D.   On: 01/21/2015 12:56   I have personally reviewed and evaluated these images as part of my medical decision-making.  ED ECG REPORT   Date: 01/21/2015  Rate: 65  Rhythm: atrial fibrillation  QRS Axis: normal  Intervals: normal  ST/T Wave abnormalities: normal  Conduction Disutrbances:none  Narrative Interpretation:   Old EKG Reviewed: unchanged  I have personally reviewed the EKG tracing and agree with the computerized printout as noted.   MDM   Final diagnoses:  Community acquired pneumonia  Patient presents for shortness of breath that has been increasing over the past week. She has a long medical history including COPD, multiple episodes of pneumonia, atrial fibrillation, and CHF. She states that she knows that she is developing pneumonia since she has had it multiple times in the past. Her chest x-ray reveals that she has pneumonia but no pneumothorax or edema.. She is on 2 L of oxygen at all times and is not hypoxic and has not had to increase her oxygen use. She is not febrile. She is well appearing and able to use the bathroom several times without shortness of breath. She is on 20 mg of Lasix. She is aware that she has atrial fibrillation and states she is anticoagulated with aspirin. Her EKG is not concerning and has not changed from previous. She states she is allergic to Levaquin and erythromycin. She also said that doxycycline and Cefdinir work very well for her. I will place her on doxycycline and have her follow-up with her primary care physician within 48 hours as discussed with the patient. I also recommended a repeat chest  x-ray in 3-4 weeks. I discussed return precautions with the patient such as hypoxia, fever, or increased shortness of breath. After discussing this with the patient she demanded an oxycodone 5 mg. When asked what she needs this for she stated that she  is in chronic pain and is seen by pain management. She states she forgot to take her oxycodone before arriving to the ED. She states she was told that if you are an addict, you could get pain medications by just asking. She states she has gotten them every time that she has come to the ED. I explained that I would not be giving her oxycodone while in the ED and that she could take it when she gets home. She was very upset about this and I showed her the [facility] guidelines for prescribing narcotic medication for chronic pain. Filed Vitals:   01/21/15 1404 01/21/15 1521  BP: 111/58 112/60  Pulse: 85 80  Temp: 97.8 F (36.6 C)   Resp: 24 20   Medications  doxycycline (VIBRA-TABS) tablet 100 mg (100 mg Oral Given 01/21/15 1405)   I personally performed the services described in this documentation, which was scribed in my presence. The recorded information has been reviewed and is accurate.      Ottie Glazier, PA-C 01/21/15 1903  Charlesetta Shanks, MD 01/24/15 [phone]

## 2015-01-21 NOTE — ED Notes (Signed)
Spoke with case mananger will see patient

## 2015-01-21 NOTE — ED Notes (Signed)
Pt reports hx pneumonia, was just treated last month. Reports this week she began to have increased cough and breathlessness, pain in R rib cage increased with coughing, concerned pneumonia has returned. A&Ox4, resp e/u

## 2015-01-30 ENCOUNTER — Telehealth: Payer: Self-pay | Admitting: Cardiovascular Disease

## 2015-01-30 ENCOUNTER — Encounter (HOSPITAL_COMMUNITY): Payer: Self-pay | Admitting: Emergency Medicine

## 2015-01-30 ENCOUNTER — Inpatient Hospital Stay (HOSPITAL_COMMUNITY)
Admission: EM | Admit: 2015-01-30 | Discharge: 2015-02-01 | DRG: 308 | Disposition: A | Discharge status: Home / Self Care | Payer: Medicare Other | Attending: Family Medicine | Admitting: Family Medicine

## 2015-01-30 ENCOUNTER — Emergency Department (HOSPITAL_COMMUNITY): Payer: Medicare Other

## 2015-01-30 DIAGNOSIS — J189 Pneumonia, unspecified organism: Secondary | ICD-10-CM | POA: Diagnosis present

## 2015-01-30 DIAGNOSIS — R0602 Shortness of breath: Secondary | ICD-10-CM

## 2015-01-30 DIAGNOSIS — I10 Essential (primary) hypertension: Secondary | ICD-10-CM | POA: Diagnosis present

## 2015-01-30 DIAGNOSIS — Z825 Family history of asthma and other chronic lower respiratory diseases: Secondary | ICD-10-CM

## 2015-01-30 DIAGNOSIS — E785 Hyperlipidemia, unspecified: Secondary | ICD-10-CM | POA: Diagnosis present

## 2015-01-30 DIAGNOSIS — G8929 Other chronic pain: Secondary | ICD-10-CM | POA: Diagnosis present

## 2015-01-30 DIAGNOSIS — I482 Chronic atrial fibrillation: Principal | ICD-10-CM | POA: Diagnosis present

## 2015-01-30 DIAGNOSIS — I4891 Unspecified atrial fibrillation: Secondary | ICD-10-CM | POA: Diagnosis present

## 2015-01-30 DIAGNOSIS — R000 Tachycardia, unspecified: Secondary | ICD-10-CM

## 2015-01-30 DIAGNOSIS — Z79899 Other long term (current) drug therapy: Secondary | ICD-10-CM

## 2015-01-30 DIAGNOSIS — K219 Gastro-esophageal reflux disease without esophagitis: Secondary | ICD-10-CM | POA: Diagnosis present

## 2015-01-30 DIAGNOSIS — J440 Chronic obstructive pulmonary disease with acute lower respiratory infection: Secondary | ICD-10-CM | POA: Diagnosis present

## 2015-01-30 DIAGNOSIS — R059 Cough, unspecified: Secondary | ICD-10-CM

## 2015-01-30 DIAGNOSIS — J441 Chronic obstructive pulmonary disease with (acute) exacerbation: Secondary | ICD-10-CM | POA: Diagnosis present

## 2015-01-30 DIAGNOSIS — R05 Cough: Secondary | ICD-10-CM

## 2015-01-30 DIAGNOSIS — J449 Chronic obstructive pulmonary disease, unspecified: Secondary | ICD-10-CM | POA: Diagnosis present

## 2015-01-30 DIAGNOSIS — Z9981 Dependence on supplemental oxygen: Secondary | ICD-10-CM

## 2015-01-30 DIAGNOSIS — Z8701 Personal history of pneumonia (recurrent): Secondary | ICD-10-CM

## 2015-01-30 DIAGNOSIS — M25511 Pain in right shoulder: Secondary | ICD-10-CM | POA: Diagnosis present

## 2015-01-30 LAB — CBC WITH DIFFERENTIAL/PLATELET
BASOS PCT: 0 %
Basophils Absolute: 0 10*3/uL (ref 0.0–0.1)
Eosinophils Absolute: 0.1 10*3/uL (ref 0.0–0.7)
Eosinophils Relative: 2 %
HEMATOCRIT: 40.3 % (ref 36.0–46.0)
Hemoglobin: 12.2 g/dL (ref 12.0–15.0)
LYMPHS ABS: 1.4 10*3/uL (ref 0.7–4.0)
Lymphocytes Relative: 26 %
MCH: 28.9 pg (ref 26.0–34.0)
MCHC: 30.3 g/dL (ref 30.0–36.0)
MCV: 95.5 fL (ref 78.0–100.0)
MONO ABS: 0.6 10*3/uL (ref 0.1–1.0)
Monocytes Relative: 10 %
NEUTROS ABS: 3.3 10*3/uL (ref 1.7–7.7)
Neutrophils Relative %: 61 %
Platelets: 259 10*3/uL (ref 150–400)
RBC: 4.22 MIL/uL (ref 3.87–5.11)
RDW: 15.1 % (ref 11.5–15.5)
WBC: 5.5 10*3/uL (ref 4.0–10.5)

## 2015-01-30 LAB — TSH: TSH: 0.295 u[IU]/mL — ABNORMAL LOW (ref 0.350–4.500)

## 2015-01-30 LAB — I-STAT TROPONIN, ED
TROPONIN I, POC: 0 ng/mL (ref 0.00–0.08)
Troponin i, poc: 0 ng/mL (ref 0.00–0.08)
Troponin i, poc: 0 ng/mL (ref 0.00–0.08)

## 2015-01-30 LAB — MAGNESIUM: Magnesium: 1.7 mg/dL (ref 1.7–2.4)

## 2015-01-30 LAB — URINALYSIS, ROUTINE W REFLEX MICROSCOPIC
Glucose, UA: NEGATIVE mg/dL
Hgb urine dipstick: NEGATIVE
Ketones, ur: NEGATIVE mg/dL
LEUKOCYTES UA: NEGATIVE
NITRITE: NEGATIVE
PH: 6 (ref 5.0–8.0)
Protein, ur: NEGATIVE mg/dL
SPECIFIC GRAVITY, URINE: 1.036 — AB (ref 1.005–1.030)

## 2015-01-30 LAB — BASIC METABOLIC PANEL
Anion gap: 9 (ref 5–15)
BUN: 21 mg/dL — AB (ref 6–20)
CHLORIDE: 102 mmol/L (ref 101–111)
CO2: 30 mmol/L (ref 22–32)
CREATININE: 0.63 mg/dL (ref 0.44–1.00)
Calcium: 9 mg/dL (ref 8.9–10.3)
GFR calc Af Amer: >60 mL/min (ref >60)
GFR calc non Af Amer: >60 mL/min (ref >60)
GLUCOSE: 97 mg/dL (ref 65–99)
Potassium: 4 mmol/L (ref 3.5–5.1)
Sodium: 141 mmol/L (ref 135–145)

## 2015-01-30 LAB — BRAIN NATRIURETIC PEPTIDE: B Natriuretic Peptide: 181.4 pg/mL — ABNORMAL HIGH (ref 0.0–100.0)

## 2015-01-30 LAB — I-STAT CG4 LACTIC ACID, ED
LACTIC ACID, VENOUS: 0.81 mmol/L (ref 0.5–2.0)
Lactic Acid, Venous: 1.06 mmol/L (ref 0.5–2.0)

## 2015-01-30 LAB — PHOSPHORUS: Phosphorus: 3.2 mg/dL (ref 2.5–4.6)

## 2015-01-30 MED ORDER — DILTIAZEM HCL 100 MG IV SOLR: 5.0000 mg/h | INTRAVENOUS | Status: DC

## 2015-01-30 MED ORDER — LIDOCAINE-PRILOCAINE 2.5-2.5 % EX CREA: TOPICAL_CREAM | Freq: Once | CUTANEOUS | Status: DC

## 2015-01-30 MED ORDER — LEVOFLOXACIN IN D5W 750 MG/150ML IV SOLN
750.0000 mg | INTRAVENOUS | Status: DC
  Filled 2015-01-30: qty 150

## 2015-01-30 MED ORDER — ACETAMINOPHEN 325 MG PO TABS: 650.0000 mg | ORAL_TABLET | Freq: Four times a day (QID) | ORAL | Status: DC | PRN

## 2015-01-30 MED ORDER — OXYCODONE-ACETAMINOPHEN 5-325 MG PO TABS
1.0000 | ORAL_TABLET | Freq: Once | ORAL | Status: AC
  Administered 2015-01-30: 1 via ORAL
  Filled 2015-01-30: qty 1

## 2015-01-30 MED ORDER — DEXTROSE 5 % IV SOLN
2.0000 g | Freq: Once | INTRAVENOUS | Status: AC
  Administered 2015-01-30: 2 g via INTRAVENOUS
  Filled 2015-01-30: qty 2

## 2015-01-30 MED ORDER — SODIUM CHLORIDE 0.9 % IJ SOLN
3.0000 mL | Freq: Two times a day (BID) | INTRAMUSCULAR | Status: DC
Start: 2015-01-30 — End: 2015-01-31

## 2015-01-30 MED ORDER — FUROSEMIDE 40 MG PO TABS: 40.0000 mg | ORAL_TABLET | Freq: Every day | ORAL | Status: DC

## 2015-01-30 MED ORDER — FLUOXETINE HCL 20 MG PO CAPS
60.0000 mg | ORAL_CAPSULE | Freq: Every day | ORAL | Status: DC
Start: 2015-01-30 — End: 2015-02-01
  Administered 2015-01-30 – 2015-02-01 (×3): 60 mg via ORAL
  Filled 2015-01-30 (×3): qty 3

## 2015-01-30 MED ORDER — BUDESONIDE-FORMOTEROL FUMARATE 160-4.5 MCG/ACT IN AERO
2.0000 | INHALATION_SPRAY | Freq: Two times a day (BID) | RESPIRATORY_TRACT | Status: DC
Start: 2015-01-30 — End: 2015-02-01
  Administered 2015-01-30 – 2015-02-01 (×4): 2 via RESPIRATORY_TRACT
  Filled 2015-01-30: qty 6

## 2015-01-30 MED ORDER — DILTIAZEM HCL 100 MG IV SOLR
5.0000 mg/h | INTRAVENOUS | Status: DC
  Administered 2015-01-30 – 2015-01-31 (×2): 5 mg/h via INTRAVENOUS
  Filled 2015-01-30 (×2): qty 100

## 2015-01-30 MED ORDER — SODIUM CHLORIDE 0.9 % IV SOLN: 250.0000 mL | INTRAVENOUS | Status: DC | PRN

## 2015-01-30 MED ORDER — SODIUM CHLORIDE 0.9 % IV BOLUS (SEPSIS)
500.0000 mL | Freq: Once | INTRAVENOUS | Status: AC
  Administered 2015-01-30: 500 mL via INTRAVENOUS

## 2015-01-30 MED ORDER — ALBUTEROL SULFATE (2.5 MG/3ML) 0.083% IN NEBU: 2.5000 mg | INHALATION_SOLUTION | Freq: Four times a day (QID) | RESPIRATORY_TRACT | Status: DC | PRN

## 2015-01-30 MED ORDER — OXYCODONE-ACETAMINOPHEN 5-325 MG PO TABS
1.0000 | ORAL_TABLET | Freq: Three times a day (TID) | ORAL | Status: DC | PRN
Start: 2015-01-30 — End: 2015-02-01
  Administered 2015-01-30 – 2015-02-01 (×6): 1 via ORAL
  Filled 2015-01-30 (×6): qty 1

## 2015-01-30 MED ORDER — METHYLPREDNISOLONE SODIUM SUCC 125 MG IJ SOLR
125.0000 mg | Freq: Once | INTRAMUSCULAR | Status: AC
Start: 2015-01-30 — End: 2015-01-30
  Administered 2015-01-30: 125 mg via INTRAVENOUS
  Filled 2015-01-30: qty 2

## 2015-01-30 MED ORDER — METOPROLOL SUCCINATE 12.5 MG HALF TABLET
12.5000 mg | ORAL_TABLET | Freq: Once | ORAL | Status: AC
  Administered 2015-01-30: 12.5 mg via ORAL
  Filled 2015-01-30: qty 1

## 2015-01-30 MED ORDER — IOHEXOL 350 MG/ML SOLN
100.0000 mL | Freq: Once | INTRAVENOUS | Status: AC | PRN
  Administered 2015-01-30: 100 mL via INTRAVENOUS

## 2015-01-30 MED ORDER — SODIUM CHLORIDE 0.9 % IJ SOLN: 3.0000 mL | Freq: Two times a day (BID) | INTRAMUSCULAR | Status: DC

## 2015-01-30 MED ORDER — BUSPIRONE HCL 10 MG PO TABS
10.0000 mg | ORAL_TABLET | Freq: Three times a day (TID) | ORAL | Status: DC
  Administered 2015-01-30 – 2015-02-01 (×7): 10 mg via ORAL
  Filled 2015-01-30 (×8): qty 1

## 2015-01-30 MED ORDER — ALBUTEROL SULFATE (2.5 MG/3ML) 0.083% IN NEBU
3.0000 mL | INHALATION_SOLUTION | Freq: Four times a day (QID) | RESPIRATORY_TRACT | Status: DC | PRN
  Administered 2015-01-31 – 2015-02-01 (×3): 3 mL via RESPIRATORY_TRACT
  Filled 2015-01-30 (×3): qty 3

## 2015-01-30 MED ORDER — SODIUM CHLORIDE 0.9 % IJ SOLN: 3.0000 mL | INTRAMUSCULAR | Status: DC | PRN

## 2015-01-30 MED ORDER — PANTOPRAZOLE SODIUM 40 MG PO TBEC
40.0000 mg | DELAYED_RELEASE_TABLET | Freq: Every day | ORAL | Status: DC
Start: 2015-01-30 — End: 2015-02-01
  Administered 2015-01-30 – 2015-02-01 (×3): 40 mg via ORAL
  Filled 2015-01-30 (×3): qty 1

## 2015-01-30 MED ORDER — POTASSIUM CHLORIDE CRYS ER 20 MEQ PO TBCR
20.0000 meq | EXTENDED_RELEASE_TABLET | Freq: Every day | ORAL | Status: DC
  Administered 2015-01-30 – 2015-02-01 (×3): 20 meq via ORAL
  Filled 2015-01-30 (×3): qty 1

## 2015-01-30 MED ORDER — DILTIAZEM LOAD VIA INFUSION
10.0000 mg | Freq: Once | INTRAVENOUS | Status: AC
  Administered 2015-01-30: 10 mg via INTRAVENOUS
  Filled 2015-01-30: qty 10

## 2015-01-30 MED ORDER — IPRATROPIUM-ALBUTEROL 0.5-2.5 (3) MG/3ML IN SOLN
3.0000 mL | Freq: Once | RESPIRATORY_TRACT | Status: AC
  Administered 2015-01-30: 3 mL via RESPIRATORY_TRACT
  Filled 2015-01-30: qty 3

## 2015-01-30 MED ORDER — DILTIAZEM HCL 30 MG PO TABS: 90.0000 mg | ORAL_TABLET | Freq: Four times a day (QID) | ORAL | Status: DC

## 2015-01-30 MED ORDER — PREDNISONE 20 MG PO TABS: 40.0000 mg | ORAL_TABLET | Freq: Every day | ORAL | Status: DC

## 2015-01-30 MED ORDER — METHOCARBAMOL 500 MG PO TABS
500.0000 mg | ORAL_TABLET | Freq: Once | ORAL | Status: AC
  Administered 2015-01-30: 500 mg via ORAL
  Filled 2015-01-30: qty 1

## 2015-01-30 MED ORDER — SODIUM CHLORIDE 0.9 % IJ SOLN
3.0000 mL | Freq: Two times a day (BID) | INTRAMUSCULAR | Status: DC
  Administered 2015-01-31: 3 mL via INTRAVENOUS

## 2015-01-30 MED ORDER — DOXYCYCLINE HYCLATE 100 MG IV SOLR
100.0000 mg | Freq: Two times a day (BID) | INTRAVENOUS | Status: DC
  Administered 2015-01-30 – 2015-02-01 (×4): 100 mg via INTRAVENOUS
  Filled 2015-01-30 (×5): qty 100

## 2015-01-30 MED ORDER — SODIUM CHLORIDE 0.9 % IV BOLUS (SEPSIS)
1000.0000 mL | Freq: Once | INTRAVENOUS | Status: AC
  Administered 2015-01-30: 1000 mL via INTRAVENOUS

## 2015-01-30 MED ORDER — TIOTROPIUM BROMIDE MONOHYDRATE 18 MCG IN CAPS
18.0000 ug | ORAL_CAPSULE | Freq: Every day | RESPIRATORY_TRACT | Status: DC
  Administered 2015-01-31 – 2015-02-01 (×2): 18 ug via RESPIRATORY_TRACT
  Filled 2015-01-30: qty 5

## 2015-01-30 MED ORDER — DILTIAZEM HCL ER COATED BEADS 300 MG PO CP24
420.0000 mg | ORAL_CAPSULE | Freq: Once | ORAL | Status: AC
  Administered 2015-01-30: 420 mg via ORAL
  Filled 2015-01-30: qty 1

## 2015-01-30 MED ORDER — ACETAMINOPHEN 650 MG RE SUPP: 650.0000 mg | Freq: Four times a day (QID) | RECTAL | Status: DC | PRN

## 2015-01-30 NOTE — ED Notes (Signed)
Bedside commode placed in patients room per request
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
## 2015-01-30 NOTE — ED Notes (Signed)
Unsuccessful blood draw attempt by this nurse; phlebotomist at bedside

## 2015-01-30 NOTE — ED Provider Notes (Signed)
CSN: [id number]     Arrival date & time 01/30/15  0610 History   None    Chief Complaint  Patient presents with  . Shortness of Breath     (Consider location/radiation/quality/duration/timing/severity/associated sxs/prior Treatment) HPI   Blood pressure 131/89, pulse 101, temperature 97.8 F (36.6 C), temperature source Oral, resp. rate 16, SpO2 99 %.  Kelsey Garcia is a 69 y.o. female with PMH of AFib (ASA only), COPD (2Lhome O2 at all times), CHF presenting for evaluation after she had several warnings from her oxygen concentrator last night which states that she wasn't breathing. Patient is unsure how it analyzes this, she doesn't wear any additional monitoring device so the analysis must come through the nasal cannula. Patient states that she "just doesn't feel right." Patient was recently diagnosed with pneumonia, has been compliant with her doxycycline but states that she had a fever of 101 yesterday, intermittently productive cough and does not think that the antibiotics are working appropriately. Will finish 10 day course of doxycycline tomorrow. States her chronic right sided rotator cuff was worse than normal and radiating to her right chest. Not alleviated with her Percocet which he obtains from pain management. Pt denies h/o DVT/PE, calf pain or leg swelling, hemoptysis, recent immobilization, cancer/chemotherapy in the last 6 months, increasing shortness of breath, peripheral edema. Had not been given steroids. Has appt. with PCP next week.  Has been tested for sleep apnea and not told she needed a BiPAP.   Past Medical History  Diagnosis Date  . Atrial fibrillation ([place])   . Hypertension   . Insomnia   . Nonischemic cardiomyopathy ([place])   . Hyperlipidemia   . Anxiety   . Cholelithiasis   . Insomnia   . Mitral regurgitation     noted 2010  . H/O epistaxis   . Rhinitis, allergic   . CHF (congestive heart failure) ([place])   . Pneumonia     "several times w/exacerbations of  the COPD; nothing in the last year" (07/15/2012)  . COPD (chronic obstructive pulmonary disease) ([place])     as of 7/13 on 2-3L, pfts 10/2008 with mod obstruction  . Chronic bronchitis with COPD (chronic obstructive pulmonary disease) ([place])   . Shortness of breath     "all the time right now" (07/15/2012)  . GERD (gastroesophageal reflux disease)   . RKYHCWCB(762.8)     "weekly" (07/15/2012)  . Migraines     "weekly for awhile; cleared up as I got older" (07/15/2012)  . DJD (degenerative joint disease)   . Arthritis     "all over" (07/15/2012)  . OCD (obsessive compulsive disorder)   . OCD (obsessive compulsive disorder)   . Depression     h/o SI; "last time I was really serious about it was ~ 1997" (07/15/2012)  . Thoracic vertebral fracture ([place]) 11/03/2014   Past Surgical History  Procedure Laterality Date  . Tubal ligation  1972  . Cardioversion  2003; 07/2003   Family History  Problem Relation Age of Onset  . Asthma    . Emphysema    . Allergies    . Cancer      aunt had several types of cancer  . COPD Mother   . Emphysema Mother   . Cirrhosis Father    Social History  Substance Use Topics  . Smoking status: Former Smoker -- 1.00 packs/day for 35 years    Types: Cigarettes    Quit date: 06/18/2014  . Smokeless tobacco: Never Used  .  Alcohol Use: No   OB History    No data available     Review of Systems  10 systems reviewed and found to be negative, except as noted in the HPI.  Allergies  Ace inhibitors; Pseudoeph-doxylamine-dm-apap; Diphenhydramine hcl; Erythromycin; Nsaids; Nyquil; Tramadol hcl; Codeine; Fosamax; Tomato; Orange fruit; and Pseudoephedrine  Home Medications   Prior to Admission medications   Medication Sig Start Date End Date Taking? Authorizing Provider  albuterol (PROVENTIL HFA;VENTOLIN HFA) 108 (90 BASE) MCG/ACT inhaler Inhale 1-2 puffs into the lungs every 6 (six) hours as needed for wheezing. 10/19/14  Yes Rigoberto Noel, MD  albuterol (PROVENTIL)  (2.5 MG/3ML) 0.083% nebulizer solution Take 3 mLs (2.5 mg total) by nebulization every 6 (six) hours as needed for wheezing. 09/21/14  Yes Maryann Mikhail, DO  busPIRone (BUSPAR) 10 MG tablet Take 10 mg by mouth 3 (three) times daily. 04/22/14  Yes Historical Provider, MD  diltiazem (TIAZAC) 420 MG 24 hr capsule Take 1 capsule (420 mg total) by mouth daily. 05/09/14 08/08/15 Yes Bartholomew Crews, MD  doxycycline (VIBRAMYCIN) 100 MG capsule Take 1 capsule (100 mg total) by mouth 2 (two) times daily. Patient taking differently: Take 100 mg by mouth 2 (two) times daily. For 10 days 12-13 to 12-23 01/21/15  Yes Hanna Patel-Mills, PA-C  FLUoxetine (PROZAC) 20 MG capsule TAKE THREE CAPSULES BY MOUTH ONCE DAILY 01/03/14  Yes Tasrif Ahmed, MD  furosemide (LASIX) 20 MG tablet Take 40 mg by mouth daily.    Yes Historical Provider, MD  guaiFENesin (MUCINEX) 600 MG 12 hr tablet Take 600 mg by mouth 2 (two) times daily as needed for cough.   Yes Historical Provider, MD  HYDROcodone-homatropine (HYCODAN) 5-1.5 MG/5ML syrup Take 5 mLs by mouth every 6 (six) hours as needed for cough.   Yes Historical Provider, MD  ipratropium-albuterol (DUONEB) 0.5-2.5 (3) MG/3ML SOLN Take 3 mLs by nebulization 3 (three) times daily. 09/21/14  Yes Maryann Mikhail, DO  KLOR-CON M20 20 MEQ tablet Take 1 tablet by mouth daily.  09/26/14  Yes Historical Provider, MD  metoprolol succinate (TOPROL-XL) 25 MG 24 hr tablet Take 0.5 tablets (12.5 mg total) by mouth daily after lunch. 07/02/14  Yes Arnoldo Morale, MD  NON FORMULARY Place 1 drop into both eyes 2 (two) times daily as needed (hurting, dry). thera- tears   Yes Historical Provider, MD  nystatin (MYCOSTATIN) 100000 UNIT/ML suspension Take 5 mLs by mouth daily as needed. FOR THRUSH 09/28/14  Yes Historical Provider, MD  oxyCODONE-acetaminophen (PERCOCET/ROXICET) 5-325 MG per tablet Take 1 tablet by mouth every 8 (eight) hours as needed for severe pain.    Yes Historical Provider, MD   pantoprazole (PROTONIX) 40 MG tablet TAKE ONE TABLET BY MOUTH TWICE DAILY 01/03/14  Yes Dellia Nims, MD  SYMBICORT 160-4.5 MCG/ACT inhaler INHALE TWO PUFFS BY MOUTH TWICE DAILY 11/06/14  Yes Rigoberto Noel, MD  tiotropium (SPIRIVA) 18 MCG inhalation capsule Place 1 capsule (18 mcg total) into inhaler and inhale daily. 08/07/14  Yes Rigoberto Noel, MD  diltiazem (CARDIZEM) 30 MG tablet Take 3 tablets (90 mg total) by mouth 4 (four) times daily. 01/30/15   Ludwin Flahive, PA-C  predniSONE (DELTASONE) 20 MG tablet Take 2 tablets (40 mg total) by mouth daily. 01/30/15   Safi Culotta, PA-C   BP 154/101 mmHg  Pulse 112  Temp(Src) 97.8 F (36.6 C) (Oral)  Resp 28  SpO2 96% Physical Exam  Constitutional: She is oriented to person, place, and time. She appears  well-developed and well-nourished. No distress.  HENT:  Head: Normocephalic.  Mouth/Throat: Oropharynx is clear and moist.  Eyes: Conjunctivae and EOM are normal. Pupils are equal, round, and reactive to light.  Neck: Normal range of motion.  Cardiovascular:  Tachycardic in the 110s to 120s, irregularly irregular  Pulmonary/Chest: Effort normal. No stridor. No respiratory distress. She has wheezes. She has no rales. She exhibits no tenderness.  Trace scattered expiratory wheezing in all fields  Abdominal: Soft. Bowel sounds are normal. She exhibits no distension and no mass. There is no tenderness. There is no rebound and no guarding.  Musculoskeletal: Normal range of motion. She exhibits no edema.  Neurological: She is alert and oriented to person, place, and time.  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  Psychiatric: She has a normal mood and affect.  Nursing note and vitals reviewed.   ED Course  Procedures (including critical care time) Labs Review Labs Reviewed  BASIC METABOLIC PANEL - Abnormal; Notable for the following:    BUN 21  (*)    All other components within normal limits  BRAIN NATRIURETIC PEPTIDE - Abnormal; Notable for the following:    B Natriuretic Peptide 181.4 (*)    All other components within normal limits  URINALYSIS, ROUTINE W REFLEX MICROSCOPIC (NOT AT [facility]) - Abnormal; Notable for the following:    Color, Urine AMBER (*)    Specific Gravity, Urine 1.036 (*)    Bilirubin Urine SMALL (*)    All other components within normal limits  CULTURE, BLOOD (ROUTINE X 2)  CULTURE, BLOOD (ROUTINE X 2)  URINE CULTURE  CBC WITH DIFFERENTIAL/PLATELET  I-STAT CG4 LACTIC ACID, ED  I-STAT TROPOININ, ED  I-STAT CG4 LACTIC ACID, ED  Randolm Idol, ED  Randolm Idol, ED    Imaging Review Dg Chest 2 View  01/30/2015  CLINICAL DATA:  Shortness of breath and cough EXAM: CHEST  2 VIEW COMPARISON:  January 21, 2015 FINDINGS: There is persistent airspace opacity in the superior lingular region. Lungs elsewhere clear. Heart size and pulmonary vascularity are normal. No adenopathy. There is mid thoracic levoscoliosis. IMPRESSION: Persistent lingular infiltrate. No new opacity. No change in cardiac silhouette. Additional follow-up study in approximately 10-14 days is advised to assess for clearing. If this opacity persists at that time, noncontrast enhanced chest CT would be advised to assess for possible underlying mass. Electronically Signed   By: Lowella Grip III M.D.   On: 01/30/2015 07:00   Ct Angio Chest Pe W/cm &/or Wo Cm  01/30/2015  CLINICAL DATA:  Shortness of breath and recent pneumonia diagnosis EXAM: CT ANGIOGRAPHY CHEST WITH CONTRAST TECHNIQUE: Multidetector CT imaging of the chest was performed using the standard protocol during bolus administration of intravenous contrast. Multiplanar CT image reconstructions and MIPs were obtained to evaluate the vascular anatomy. CONTRAST:  19m OMNIPAQUE IOHEXOL 350 MG/ML SOLN COMPARISON:  10/15/2024 FINDINGS: The lungs are well aerated bilaterally with the  exception of focal consolidation within the lingula similar to that seen on the prior plain film examination. No sizable effusion or pneumothorax is noted. The thoracic inlet is within normal limits. The thoracic aorta demonstrates atherosclerotic change without evidence of dissection. Stable dilatation in the descending aorta is noted to 3.9 cm. Scattered small hilar and mediastinal lymph nodes are identified likely reactive in nature given lingular infiltrate. Calcified subcarinal lymph nodes are seen consistent with prior granulomatous disease. Pulmonary artery demonstrates  a normal branching pattern. No focally filling defect to suggest pulmonary embolism is identified. Cardiac structures show mild coronary calcifications as well as calcifications of the aortic valve. Scanning into the upper abdomen shows no acute abnormality. The osseous structures show chronic compression deformity at T3. Progressive compression deformities at T5 and T6 are noted. This may contribute to the patient's underlying symptomatology. Review of the MIP images confirms the above findings. IMPRESSION: No evidence of pulmonary emboli. Lingular infiltrate similar to that seen on recent plain film. Changes of prior granulomatous disease. Increasing compression deformities at T5 and T6. Increased kyphosis is noted at this level as well. Electronically Signed   By: Inez Catalina M.D.   On: 01/30/2015 08:59   I have personally reviewed and evaluated these images and lab results as part of my medical decision-making.   EKG Interpretation   Date/Time:  Wednesday January 30 2015 06:18:09 EST Ventricular Rate:  110 PR Interval:    QRS Duration: 98 QT Interval:  333 QTC Calculation: 450 R Axis:   61 Text Interpretation:  Atrial fibrillation Paired ventricular premature  complexes Low voltage, precordial leads Probable anteroseptal infarct, old  Nonspecific repol abnormality, lateral leads Minimal ST elevation,  inferior leads  Since last tracing rate faster Confirmed by MILLER  MD,  BRIAN ([id number]) on 01/30/2015 7:57:23 AM      MDM   Final diagnoses:  Cough  SOB (shortness of breath)  CAP (community acquired pneumonia)  Tachycardia    Filed Vitals:   01/30/15 1245 01/30/15 1251 01/30/15 1300 01/30/15 1330  BP: 140/105  149/114 154/101  Pulse: 112 112    Temp:      TempSrc:      Resp: '15 25 25 28  '$ SpO2: 98% 96%      Medications  sodium chloride 0.9 % bolus 1,000 mL (1,000 mLs Intravenous New Bag/Given 01/30/15 1400)  diltiazem (CARDIZEM) 1 mg/mL load via infusion 10 mg (10 mg Intravenous Given 01/30/15 1406)    And  diltiazem (CARDIZEM) 100 mg in dextrose 5 % 100 mL (1 mg/mL) infusion (5 mg/hr Intravenous New Bag/Given 01/30/15 1400)  methylPREDNISolone sodium succinate (SOLU-MEDROL) 125 mg/2 mL injection 125 mg (125 mg Intravenous Given 01/30/15 0716)  ipratropium-albuterol (DUONEB) 0.5-2.5 (3) MG/3ML nebulizer solution 3 mL (3 mLs Nebulization Given 01/30/15 0655)  methocarbamol (ROBAXIN) tablet 500 mg (500 mg Oral Given 01/30/15 0716)  sodium chloride 0.9 % bolus 500 mL (0 mLs Intravenous Stopped 01/30/15 0810)  cefTRIAXone (ROCEPHIN) 2 g in dextrose 5 % 50 mL IVPB (0 g Intravenous Stopped 01/30/15 0830)  iohexol (OMNIPAQUE) 350 MG/ML injection 100 mL (100 mLs Intravenous Contrast Given 01/30/15 0824)  diltiazem (CARDIZEM CD) 24 hr capsule 420 mg (420 mg Oral Given 01/30/15 1146)  metoprolol succinate (TOPROL-XL) 24 hr tablet 12.5 mg (12.5 mg Oral Given 01/30/15 1141)  oxyCODONE-acetaminophen (PERCOCET/ROXICET) 5-325 MG per tablet 1 tablet (1 tablet Oral Given 01/30/15 1142)    GIRTRUDE ENSLIN is 69 y.o. female presenting with complaints of "not feeling right" and also concerned that her oxygen concentrator has alarmed that she had episodes of apnea. D/w RT who will talk to the PT and call the home O2 supplier to clarify.  Patient is saturating well on her typical 2 L of oxygen. Mild tachycardia.    Blood work is reassuring with normal lactic acid, no leukocytosis, negative troponin area patient is cultured.  Chest x-ray shows a persistent lingular infiltrate. Radiology recommends repeat x-ray in 10-14 days to assess for clearing,  persistent abnormality would warrant a non-con chest CT to evaluate for mass.   RT has discussed the warm at this patient, she normally is on continuous flow, she changed it to pulse sleep mode, she basically has had a negative inspiratory pressure large enough for the machine to register it to initiate a pulse of oxygen, patient was not advised to do this by any care providers, she states that she just felt bad and thought she would try it. Advised patient not to change settings without discussing it with her team.  This is a shared visit with the attending physician who personally evaluated the patient and agrees with the care plan.   Patient states that her diltiazem 420 mg extended relief is no longer being covered by her insurance, states she hasn't had it in 5 days, with likely explains her tachycardia. Patient will be given her dose in the ED, I discussed this with Polly Cobia, [place] he recommends that patient be started on immediate release diltiazem, states max daily dose is 360 and should be given over 4 doses per day. 5 patient to discuss this with her primary care physician.  I have given her her home dose of cardizem, metoprolol and Percocet and she remains tachycardic in the 110s to 120s. Discussed with attending physician, given her A. fib with RVR persistent tachycardia and shortness of breath should be admitted.  Patient will be started on diltiazem 10 mg and drip. Case discussed with triad hospitalist Dr. Wendee Beavers who accepts admission, also discussed with Trish from cardiology, cardiology will consult on the floor.   Monico Blitz, PA-C 01/30/15 1428  Noemi Chapel, MD 01/30/15 2106

## 2015-01-30 NOTE — Progress Notes (Signed)
[facility] ED PA contacted RT for PT assessment (PT uses home 02 and had questions / concerns). RT discovered that PT was utilizing 'Pulse at Sleep' mode. Per PT this mode will deliver 02 when breath triggered- and will alarm if breathing is absent for 2 minutes or greater. PT states she used 'Pulse at Sleep' last night for "testing." RT recommended PT go back to continuous flow on 02 concentrator. PT states she agrees and is capable of changing back to continuous flow. RT reported to PA the discussion above. PA had no other RT needs at that time.

## 2015-01-30 NOTE — ED Notes (Signed)
Per EMS pt was recently dx with pneumonia here at [facility]; pt presents co SOB and right arm pain d/t hx arthritis; pt states she uses 2L O2 at home with hx COPD; pt states she received doxycyline at discharge but reports she has been getting worse at home

## 2015-01-30 NOTE — ED Notes (Signed)
Delay in transfer; MD at bedside

## 2015-01-30 NOTE — ED Notes (Signed)
PA at bedside.

## 2015-01-30 NOTE — ED Notes (Signed)
Bed: WA17 Expected date:  Expected time:  Means of arrival:  Comments: EMS 

## 2015-01-30 NOTE — ED Notes (Signed)
Delay in urinalysis, pt went to the restroom and didn't collect sample, will try again later

## 2015-01-30 NOTE — H&P (Signed)
[facility] History and Physical  Kelsey Garcia ZOX:[id number] DOB: 07-08-1945 DOA: 01/30/2015  Referring physician: ED personnel PCP: Darden Amber, PA   Chief Complaint: sob  HPI: Kelsey Garcia is a 69 y.o. female  With history of recent pneumonia, COPD, atrial fibrillation. Presenting with 3 day complaint of worsening shortness of breath. Problem has been persistent and progressively getting worse since onset. Activity makes it worse. Nothing she is aware of currently makes it better. Patient states that she was on medication for pneumonia roughly week ago. She has not felt better. States that roughly one week ago she was not taking her Cardizem due to insurance problems.   Review of Systems:  Constitutional:  No weight loss, night sweats, Fevers, chills, fatigue.  HEENT:  No headaches, Difficulty swallowing,Tooth/dental problems,Sore throat,  No sneezing, itching, ear ache, nasal congestion, post nasal drip,  Cardio-vascular:  No chest pain, Orthopnea, PND, swelling in lower extremities, anasarca, dizziness, + palpitations  GI:  No heartburn, indigestion, abdominal pain, nausea, vomiting, diarrhea, change in bowel habits, loss of appetite  Resp:  + shortness of breath with exertion or at rest. No excess mucus, no productive cough, + non-productive cough, + coughing up of blood.No change in color of mucus.No wheezing.No chest wall deformity  Skin:  no rash or lesions.  GU:  no dysuria, change in color of urine, no urgency or frequency. No flank pain.  Musculoskeletal:  + joint pain or swelling. No decreased range of motion. No back pain.  Psych:  No change in mood or affect. No depression or anxiety. No memory loss.   Past Medical History  Diagnosis Date  . Atrial fibrillation ([place])   . Hypertension   . Insomnia   . Nonischemic cardiomyopathy ([place])   . Hyperlipidemia   . Anxiety   . Cholelithiasis   . Insomnia   . Mitral regurgitation     noted 2010  . H/O  epistaxis   . Rhinitis, allergic   . CHF (congestive heart failure) ([place])   . Pneumonia     "several times w/exacerbations of the COPD; nothing in the last year" (07/15/2012)  . COPD (chronic obstructive pulmonary disease) ([place])     as of 7/13 on 2-3L, pfts 10/2008 with mod obstruction  . Chronic bronchitis with COPD (chronic obstructive pulmonary disease) ([place])   . Shortness of breath     "all the time right now" (07/15/2012)  . GERD (gastroesophageal reflux disease)   . WJXBJYNW(295.6)     "weekly" (07/15/2012)  . Migraines     "weekly for awhile; cleared up as I got older" (07/15/2012)  . DJD (degenerative joint disease)   . Arthritis     "all over" (07/15/2012)  . OCD (obsessive compulsive disorder)   . OCD (obsessive compulsive disorder)   . Depression     h/o SI; "last time I was really serious about it was ~ 1997" (07/15/2012)  . Thoracic vertebral fracture ([place]) 11/03/2014   Past Surgical History  Procedure Laterality Date  . Tubal ligation  1972  . Cardioversion  2003; 07/2003   Social History:  reports that she quit smoking about 7 months ago. Her smoking use included Cigarettes. She has a 35 pack-year smoking history. She has never used smokeless tobacco. She reports that she does not drink alcohol or use illicit drugs.  Allergies  Allergen Reactions  . Ace Inhibitors Cough  . Pseudoeph-Doxylamine-Dm-Apap Itching  . Diphenhydramine Hcl Other (See Comments)    "feels like my skin  is crawling, and legs twitches"  . Erythromycin Diarrhea  . Nsaids Nausea And Vomiting  . Nyquil [Pseudoeph-Doxylamine-Dm-Apap] Itching  . Tramadol Hcl Other (See Comments)    stomach pain, hallucinations  . Codeine Swelling    Face and lips swelling  Patient report Can tolerate hydrocodone  . Fosamax [Alendronate] Other (See Comments)    Nausea and abdominal bloating.   . Tomato Other (See Comments)    Stomach swells  . Orange Fruit [Citrus] Rash  . Pseudoephedrine Palpitations    Family  History  Problem Relation Age of Onset  . Asthma    . Emphysema    . Allergies    . Cancer      aunt had several types of cancer  . COPD Mother   . Emphysema Mother   . Cirrhosis Father     Prior to Admission medications   Medication Sig Start Date End Date Taking? Authorizing Provider  albuterol (PROVENTIL HFA;VENTOLIN HFA) 108 (90 BASE) MCG/ACT inhaler Inhale 1-2 puffs into the lungs every 6 (six) hours as needed for wheezing. 10/19/14  Yes Rigoberto Noel, MD  albuterol (PROVENTIL) (2.5 MG/3ML) 0.083% nebulizer solution Take 3 mLs (2.5 mg total) by nebulization every 6 (six) hours as needed for wheezing. 09/21/14  Yes Maryann Mikhail, DO  busPIRone (BUSPAR) 10 MG tablet Take 10 mg by mouth 3 (three) times daily. 04/22/14  Yes Historical Provider, MD  diltiazem (TIAZAC) 420 MG 24 hr capsule Take 1 capsule (420 mg total) by mouth daily. 05/09/14 08/08/15 Yes Bartholomew Crews, MD  doxycycline (VIBRAMYCIN) 100 MG capsule Take 1 capsule (100 mg total) by mouth 2 (two) times daily. Patient taking differently: Take 100 mg by mouth 2 (two) times daily. For 10 days 12-13 to 12-23 01/21/15  Yes Hanna Patel-Mills, PA-C  FLUoxetine (PROZAC) 20 MG capsule TAKE THREE CAPSULES BY MOUTH ONCE DAILY 01/03/14  Yes Tasrif Ahmed, MD  furosemide (LASIX) 20 MG tablet Take 40 mg by mouth daily.    Yes Historical Provider, MD  guaiFENesin (MUCINEX) 600 MG 12 hr tablet Take 600 mg by mouth 2 (two) times daily as needed for cough.   Yes Historical Provider, MD  HYDROcodone-homatropine (HYCODAN) 5-1.5 MG/5ML syrup Take 5 mLs by mouth every 6 (six) hours as needed for cough.   Yes Historical Provider, MD  ipratropium-albuterol (DUONEB) 0.5-2.5 (3) MG/3ML SOLN Take 3 mLs by nebulization 3 (three) times daily. 09/21/14  Yes Maryann Mikhail, DO  KLOR-CON M20 20 MEQ tablet Take 1 tablet by mouth daily.  09/26/14  Yes Historical Provider, MD  metoprolol succinate (TOPROL-XL) 25 MG 24 hr tablet Take 0.5 tablets (12.5 mg total)  by mouth daily after lunch. 07/02/14  Yes Arnoldo Morale, MD  NON FORMULARY Place 1 drop into both eyes 2 (two) times daily as needed (hurting, dry). thera- tears   Yes Historical Provider, MD  nystatin (MYCOSTATIN) 100000 UNIT/ML suspension Take 5 mLs by mouth daily as needed. FOR THRUSH 09/28/14  Yes Historical Provider, MD  oxyCODONE-acetaminophen (PERCOCET/ROXICET) 5-325 MG per tablet Take 1 tablet by mouth every 8 (eight) hours as needed for severe pain.    Yes Historical Provider, MD  pantoprazole (PROTONIX) 40 MG tablet TAKE ONE TABLET BY MOUTH TWICE DAILY 01/03/14  Yes Dellia Nims, MD  SYMBICORT 160-4.5 MCG/ACT inhaler INHALE TWO PUFFS BY MOUTH TWICE DAILY 11/06/14  Yes Rigoberto Noel, MD  tiotropium (SPIRIVA) 18 MCG inhalation capsule Place 1 capsule (18 mcg total) into inhaler and inhale daily. 08/07/14  Yes  Rigoberto Noel, MD  diltiazem (CARDIZEM) 30 MG tablet Take 3 tablets (90 mg total) by mouth 4 (four) times daily. 01/30/15   Nicole Pisciotta, PA-C  predniSONE (DELTASONE) 20 MG tablet Take 2 tablets (40 mg total) by mouth daily. 01/30/15   Monico Blitz, PA-C   Physical Exam: Filed Vitals:   01/30/15 1415 01/30/15 1430 01/30/15 1445 01/30/15 1500  BP:  145/93  133/98  Pulse: 94 91 93 91  Temp:      TempSrc:      Resp: '20 24 23 17  '$ SpO2: 95% 100% 98% 97%    Wt Readings from Last 3 Encounters:  11/30/14 79.379 kg (175 lb)  11/04/14 80.332 kg (177 lb 1.6 oz)  10/16/14 83.462 kg (184 lb)    General:  Appears calm and comfortable Eyes: PERRL, normal lids, irises & conjunctiva ENT: grossly normal hearing, lips & tongue Neck: no LAD, masses or thyromegaly Cardiovascular: irregularly irregular, no m/r/g.  Telemetry: irregularly irregular normal ventricular response Respiratory: Tignall in place, equal chest rise, no wheezes Abdomen: soft, ntnd Skin: no rash or induration seen on limited exam Musculoskeletal: grossly normal tone BUE/BLE Psychiatric: grossly normal mood and affect,  speech fluent and appropriate Neurologic: answers questions appropriately moves ext equally          Labs on Admission:  Basic Metabolic Panel:  Recent Labs Lab 01/30/15 0625  NA 141  K 4.0  CL 102  CO2 30  GLUCOSE 97  BUN 21*  CREATININE 0.63  CALCIUM 9.0   Liver Function Tests: No results for input(s): AST, ALT, ALKPHOS, BILITOT, PROT, ALBUMIN in the last 168 hours. No results for input(s): LIPASE, AMYLASE in the last 168 hours. No results for input(s): AMMONIA in the last 168 hours. CBC:  Recent Labs Lab 01/30/15 0625  WBC 5.5  NEUTROABS 3.3  HGB 12.2  HCT 40.3  MCV 95.5  PLT 259   Cardiac Enzymes: No results for input(s): CKTOTAL, CKMB, CKMBINDEX, TROPONINI in the last 168 hours.  BNP (last 3 results)  Recent Labs  10/16/14 1447 11/01/14 1535 01/30/15 0625  BNP 142.3* 229.4* 181.4*    ProBNP (last 3 results) No results for input(s): PROBNP in the last 8760 hours.  CBG: No results for input(s): GLUCAP in the last 168 hours.  Radiological Exams on Admission: Dg Chest 2 View  01/30/2015  CLINICAL DATA:  Shortness of breath and cough EXAM: CHEST  2 VIEW COMPARISON:  January 21, 2015 FINDINGS: There is persistent airspace opacity in the superior lingular region. Lungs elsewhere clear. Heart size and pulmonary vascularity are normal. No adenopathy. There is mid thoracic levoscoliosis. IMPRESSION: Persistent lingular infiltrate. No new opacity. No change in cardiac silhouette. Additional follow-up study in approximately 10-14 days is advised to assess for clearing. If this opacity persists at that time, noncontrast enhanced chest CT would be advised to assess for possible underlying mass. Electronically Signed   By: Lowella Grip III M.D.   On: 01/30/2015 07:00   Ct Angio Chest Pe W/cm &/or Wo Cm  01/30/2015  CLINICAL DATA:  Shortness of breath and recent pneumonia diagnosis EXAM: CT ANGIOGRAPHY CHEST WITH CONTRAST TECHNIQUE: Multidetector CT imaging of  the chest was performed using the standard protocol during bolus administration of intravenous contrast. Multiplanar CT image reconstructions and MIPs were obtained to evaluate the vascular anatomy. CONTRAST:  116m OMNIPAQUE IOHEXOL 350 MG/ML SOLN COMPARISON:  10/15/2024 FINDINGS: The lungs are well aerated bilaterally with the exception of focal consolidation within the lingula  similar to that seen on the prior plain film examination. No sizable effusion or pneumothorax is noted. The thoracic inlet is within normal limits. The thoracic aorta demonstrates atherosclerotic change without evidence of dissection. Stable dilatation in the descending aorta is noted to 3.9 cm. Scattered small hilar and mediastinal lymph nodes are identified likely reactive in nature given lingular infiltrate. Calcified subcarinal lymph nodes are seen consistent with prior granulomatous disease. Pulmonary artery demonstrates a normal branching pattern. No focally filling defect to suggest pulmonary embolism is identified. Cardiac structures show mild coronary calcifications as well as calcifications of the aortic valve. Scanning into the upper abdomen shows no acute abnormality. The osseous structures show chronic compression deformity at T3. Progressive compression deformities at T5 and T6 are noted. This may contribute to the patient's underlying symptomatology. Review of the MIP images confirms the above findings. IMPRESSION: No evidence of pulmonary emboli. Lingular infiltrate similar to that seen on recent plain film. Changes of prior granulomatous disease. Increasing compression deformities at T5 and T6. Increased kyphosis is noted at this level as well. Electronically Signed   By: Inez Catalina M.D.   On: 01/30/2015 08:59   EKG: irregularly irregular with rapid ventricular response. + artifact  Assessment/Plan Principal Problem:   Atrial fibrillation with RVR ([place]) - Cardiology to manage - for now place on cardizem gtt and  monitor on telemetry - Will defer anticoagulation recommendations to cardiology who has been consulted for this  Pneumonia - Treat with Levaquin pharmacy to dose  Active Problems:    Essential hypertension -Patient is currently on Cardizem - Was also given and beta blocker in the ED - Currently well controlled    COPD (chronic obstructive pulmonary disease) ([place]) - Stable continue home medication regimen     GERD (gastroesophageal reflux disease) - Stable continue PPI    Code Status: full DVT Prophylaxis: heparin Family Communication: None at bedside Disposition Plan: Telemetry  Time spent: > 70 minutes  Velvet Bathe [facility] Pager [phone]

## 2015-01-30 NOTE — ED Provider Notes (Signed)
The pt has hx of SOB, recent pna - took doxy - presents today with SOB and tachycardia - has pulse of 120, has xray and CT showing pna - no masses and no PE - needs meds for pulse, stable otherwise.  Pulse did not improved with home meds including the cardizem that she has been out of for 5 days Will admit for ongoing tachycarida with presence of pna.  Medical screening examination/treatment/procedure(s) were conducted as a shared visit with non-physician practitioner(s) and myself.  I personally evaluated the patient during the encounter.  Clinical Impression:   Final diagnoses:  Cough  SOB (shortness of breath)  CAP (community acquired pneumonia)  Tachycardia         Noemi Chapel, MD 01/30/15 2106

## 2015-01-30 NOTE — ED Notes (Signed)
Pt to CT

## 2015-01-30 NOTE — Discharge Instructions (Signed)
Please follow with your primary care doctor in the next 2 days for a check-up. They must obtain records for further management.   Do not hesitate to return to the Emergency Department for any new, worsening or concerning symptoms.    Community-Acquired Pneumonia, Adult Pneumonia is an infection of the lungs. There are different types of pneumonia. One type can develop while a person is in a hospital. A different type, called community-acquired pneumonia, develops in people who are not, or have not recently been, in the hospital or other health care facility.  CAUSES Pneumonia may be caused by bacteria, viruses, or funguses. Community-acquired pneumonia is often caused by Streptococcus pneumonia bacteria. These bacteria are often passed from one person to another by breathing in droplets from the cough or sneeze of an infected person. RISK FACTORS The condition is more likely to develop in:  People who havechronic diseases, such as chronic obstructive pulmonary disease (COPD), asthma, congestive heart failure, cystic fibrosis, diabetes, or kidney disease.  People who haveearly-stage or late-stage HIV.  People who havesickle cell disease.  People who havehad their spleen removed (splenectomy).  People who havepoor Human resources officer.  People who havemedical conditions that increase the risk of breathing in (aspirating) secretions their own mouth and nose.   People who havea weakened immune system (immunocompromised).  People who smoke.  People whotravel to areas where pneumonia-causing germs commonly exist.  People whoare around animal habitats or animals that have pneumonia-causing germs, including birds, bats, rabbits, cats, and farm animals. SYMPTOMS Symptoms of this condition include:  Adry cough.  A wet (productive) cough.  Fever.  Sweating.  Chest pain, especially when breathing deeply or coughing.  Rapid breathing or difficulty breathing.  Shortness of  breath.  Shaking chills.  Fatigue.  Muscle aches. DIAGNOSIS Your health care provider will take a medical history and perform a physical exam. You may also have other tests, including:  Imaging studies of your chest, including X-rays.  Tests to check your blood oxygen level and other blood gases.  Other tests on blood, mucus (sputum), fluid around your lungs (pleural fluid), and urine. If your pneumonia is severe, other tests may be done to identify the specific cause of your illness. TREATMENT The type of treatment that you receive depends on many factors, such as the cause of your pneumonia, the medicines you take, and other medical conditions that you have. For most adults, treatment and recovery from pneumonia may occur at home. In some cases, treatment must happen in a hospital. Treatment may include:  Antibiotic medicines, if the pneumonia was caused by bacteria.  Antiviral medicines, if the pneumonia was caused by a virus.  Medicines that are given by mouth or through an IV tube.  Oxygen.  Respiratory therapy. Although rare, treating severe pneumonia may include:  Mechanical ventilation. This is done if you are not breathing well on your own and you cannot maintain a safe blood oxygen level.  Thoracentesis. This procedureremoves fluid around one lung or both lungs to help you breathe better. HOME CARE INSTRUCTIONS  Take over-the-counter and prescription medicines only as told by your health care provider.  Only takecough medicine if you are losing sleep. Understand that cough medicine can prevent your body's natural ability to remove mucus from your lungs.  If you were prescribed an antibiotic medicine, take it as told by your health care provider. Do not stop taking the antibiotic even if you start to feel better.  Sleep in a semi-upright position at night. Try  sleeping in a reclining chair, or place a few pillows under your head.  Do not use tobacco products,  including cigarettes, chewing tobacco, and e-cigarettes. If you need help quitting, ask your health care provider.  Drink enough water to keep your urine clear or pale yellow. This will help to thin out mucus secretions in your lungs. PREVENTION There are ways that you can decrease your risk of developing community-acquired pneumonia. Consider getting a pneumococcal vaccine if:  You are older than 68 years of age.  You are older than 68 years of age and are undergoing cancer treatment, have chronic lung disease, or have other medical conditions that affect your immune system. Ask your health care provider if this applies to you. There are different types and schedules of pneumococcal vaccines. Ask your health care provider which vaccination option is best for you. You may also prevent community-acquired pneumonia if you take these actions:  Get an influenza vaccine every year. Ask your health care provider which type of influenza vaccine is best for you.  Go to the dentist on a regular basis.  Wash your hands often. Use hand sanitizer if soap and water are not available. SEEK MEDICAL CARE IF:  You have a fever.  You are losing sleep because you cannot control your cough with cough medicine. SEEK IMMEDIATE MEDICAL CARE IF:  You have worsening shortness of breath.  You have increased chest pain.  Your sickness becomes worse, especially if you are an older adult or have a weakened immune system.  You cough up blood.   This information is not intended to replace advice given to you by your health care provider. Make sure you discuss any questions you have with your health care provider.   Document Released: 01/26/2005 Document Revised: 10/17/2014 Document Reviewed: 05/23/2014 Elsevier Interactive Patient Education [employer].

## 2015-01-30 NOTE — Telephone Encounter (Signed)
Spoke with Landry Dyke at [facility]. She states that pt wanted their office to fill Diltiazem and they did just one time for her. Pt told them that insurance now will not cover Diltiazem '420mg'$  because it is off market. Landry Dyke spoke with insurance company and they state that they will only cover Verapamil.  PCP did not want to change meds but wanted to make [name] aware so that he could make changes. Will forward to [name] and his nurse Jeannene Patella for review, advisement and follow up. Pt is currently admitted at [facility].

## 2015-01-30 NOTE — Telephone Encounter (Signed)
New message      Talk to the nurse about patient's presc for diltiazem

## 2015-01-30 NOTE — ED Notes (Signed)
Spoke with Wells Guiles NS on 4E. 37mn start at 1502. MJana HalfRN aware

## 2015-01-30 NOTE — ED Notes (Signed)
Patient transported to X-ray 

## 2015-01-31 DIAGNOSIS — Z79899 Other long term (current) drug therapy: Secondary | ICD-10-CM | POA: Diagnosis not present

## 2015-01-31 DIAGNOSIS — J440 Chronic obstructive pulmonary disease with acute lower respiratory infection: Secondary | ICD-10-CM | POA: Diagnosis present

## 2015-01-31 DIAGNOSIS — J441 Chronic obstructive pulmonary disease with (acute) exacerbation: Secondary | ICD-10-CM | POA: Diagnosis present

## 2015-01-31 DIAGNOSIS — K219 Gastro-esophageal reflux disease without esophagitis: Secondary | ICD-10-CM | POA: Diagnosis present

## 2015-01-31 DIAGNOSIS — Z8701 Personal history of pneumonia (recurrent): Secondary | ICD-10-CM | POA: Diagnosis not present

## 2015-01-31 DIAGNOSIS — I10 Essential (primary) hypertension: Secondary | ICD-10-CM | POA: Diagnosis present

## 2015-01-31 DIAGNOSIS — M25511 Pain in right shoulder: Secondary | ICD-10-CM | POA: Diagnosis present

## 2015-01-31 DIAGNOSIS — I482 Chronic atrial fibrillation: Secondary | ICD-10-CM | POA: Diagnosis present

## 2015-01-31 DIAGNOSIS — E785 Hyperlipidemia, unspecified: Secondary | ICD-10-CM | POA: Diagnosis present

## 2015-01-31 DIAGNOSIS — G8929 Other chronic pain: Secondary | ICD-10-CM | POA: Diagnosis present

## 2015-01-31 DIAGNOSIS — J189 Pneumonia, unspecified organism: Secondary | ICD-10-CM | POA: Diagnosis present

## 2015-01-31 DIAGNOSIS — R0602 Shortness of breath: Secondary | ICD-10-CM | POA: Diagnosis present

## 2015-01-31 DIAGNOSIS — I4891 Unspecified atrial fibrillation: Secondary | ICD-10-CM | POA: Diagnosis not present

## 2015-01-31 DIAGNOSIS — Z825 Family history of asthma and other chronic lower respiratory diseases: Secondary | ICD-10-CM | POA: Diagnosis not present

## 2015-01-31 DIAGNOSIS — Z9981 Dependence on supplemental oxygen: Secondary | ICD-10-CM | POA: Diagnosis not present

## 2015-01-31 LAB — CBC
HCT: 34.5 % — ABNORMAL LOW (ref 36.0–46.0)
Hemoglobin: 10.8 g/dL — ABNORMAL LOW (ref 12.0–15.0)
MCH: 29.5 pg (ref 26.0–34.0)
MCHC: 31.3 g/dL (ref 30.0–36.0)
MCV: 94.3 fL (ref 78.0–100.0)
PLATELETS: 212 10*3/uL (ref 150–400)
RBC: 3.66 MIL/uL — ABNORMAL LOW (ref 3.87–5.11)
RDW: 15 % (ref 11.5–15.5)
WBC: 6.8 10*3/uL (ref 4.0–10.5)

## 2015-01-31 LAB — BASIC METABOLIC PANEL
Anion gap: 7 (ref 5–15)
BUN: 16 mg/dL (ref 6–20)
CALCIUM: 8.9 mg/dL (ref 8.9–10.3)
CHLORIDE: 102 mmol/L (ref 101–111)
CO2: 29 mmol/L (ref 22–32)
CREATININE: 0.54 mg/dL (ref 0.44–1.00)
GFR calc Af Amer: >60 mL/min (ref >60)
GFR calc non Af Amer: >60 mL/min (ref >60)
Glucose, Bld: 112 mg/dL — ABNORMAL HIGH (ref 65–99)
Potassium: 4.1 mmol/L (ref 3.5–5.1)
SODIUM: 138 mmol/L (ref 135–145)

## 2015-01-31 LAB — EXPECTORATED SPUTUM ASSESSMENT W REFEX TO RESP CULTURE

## 2015-01-31 LAB — URINE CULTURE

## 2015-01-31 LAB — EXPECTORATED SPUTUM ASSESSMENT W GRAM STAIN, RFLX TO RESP C

## 2015-01-31 LAB — HIV ANTIBODY (ROUTINE TESTING W REFLEX): HIV Screen 4th Generation wRfx: NONREACTIVE

## 2015-01-31 MED ORDER — DILTIAZEM HCL 60 MG PO TABS
120.0000 mg | ORAL_TABLET | Freq: Three times a day (TID) | ORAL | Status: DC
  Administered 2015-01-31 – 2015-02-01 (×4): 120 mg via ORAL
  Filled 2015-01-31 (×4): qty 2

## 2015-01-31 MED ORDER — ASPIRIN EC 81 MG PO TBEC
81.0000 mg | DELAYED_RELEASE_TABLET | Freq: Every day | ORAL | Status: DC
  Administered 2015-01-31 – 2015-02-01 (×2): 81 mg via ORAL
  Filled 2015-01-31 (×2): qty 1

## 2015-01-31 NOTE — Care Management Note (Signed)
Case Management Note  Patient Details  Name: Kelsey Garcia MRN: [id number] Date of Birth: April 17, 1945  Subjective/Objective:69 y/o f admitted w/COPD exac, a fib w/rvr.From home. Has home 02-[facility].  [facility] referral-multiple ed/admissions in past 6 months.                  Action/Plan:d/c plan home.   Expected Discharge Date:   (UNKNOWN)               Expected Discharge Plan:  Home/Self Care  In-House Referral:     Discharge planning Services  CM Consult  Post Acute Care Choice:  Durable Medical Equipment [facility] dme -[phone]) Choice offered to:     DME Arranged:    DME Agency:     HH Arranged:    [place] Agency:     Status of Service:  In process, will continue to follow  Medicare Important Message Given:    Date Medicare IM Given:    Medicare IM give by:    Date Additional Medicare IM Given:    Additional Medicare Important Message give by:     If discussed at [place] of Stay Meetings, dates discussed:    Additional Comments:  Dessa Phi, RN 01/31/2015, 1:23 PM

## 2015-01-31 NOTE — Progress Notes (Signed)
[facility] PROGRESS NOTE  Kelsey Garcia GUR:[id number] DOB: 04-20-1945 DOA: 01/30/2015 PCP: Darden Amber, PA  Assessment/Plan: Principal Problem:   Atrial fibrillation with RVR [facility]) - Cardiology managing currently. Patient will be transitioned to Cardizem today - As per cardiology note patient refuses anticoagulation is currently preferring to be on aspirin - will place order for aspirin  Pneumonia -Patient refuses Levaquin as such place patient on doxycycline.  Active Problems:   HLD (hyperlipidemia) - Stable, no chest pain reported    Essential hypertension -Patient is currently on Cardizem    COPD (chronic obstructive pulmonary disease) (HCC) -There is mild wheezing but active infection prefer at this juncture per my discussion with patient to avoid prednisone. Should wheezing worsen or shortness of breath worsened and will plan on adding steroid. - For now we'll continue Symbicort and Spiriva and albuterol    GERD (gastroesophageal reflux disease) -Stable currently we'll continue PPI   Code Status: Full Family Communication: Discussed directly with patient Disposition Plan: Barriers to discharge: Recent atrial fibrillation with RVR. Currently undergoing observational period on oral Cardizem trial.   Consultants:  Cardiology  Procedures:  None  Antibiotics:   Doxycycline  HPI/Subjective: Pt has no new complaints. No acute issues reported overnight.  Objective: Filed Vitals:   01/31/15 0421 01/31/15 1305  BP: 125/69 96/62  Pulse: 81 70  Temp: 97.6 F (36.4 C) 98.3 F (36.8 C)  Resp: 18 20    Intake/Output Summary (Last 24 hours) at 01/31/15 1358 Last data filed at 01/31/15 1310  Gross per 24 hour  Intake   1880 ml  Output      1 ml  Net   1879 ml   Filed Weights   01/30/15 1551  Weight: 78.926 kg (174 lb)    Exam:   General:  Pt in nad, alert and awake  Cardiovascular: irregularly irregular, no mrg  Respiratory: CTA BL, no  wheezes  Abdomen: soft, ND, NT  Musculoskeletal: no cyanosis or clubbing   Data Reviewed: Basic Metabolic Panel:  Recent Labs Lab 01/30/15 0625 01/30/15 1640 01/31/15 0504  NA 141  --  138  K 4.0  --  4.1  CL 102  --  102  CO2 30  --  29  GLUCOSE 97  --  112*  BUN 21*  --  16  CREATININE 0.63  --  0.54  CALCIUM 9.0  --  8.9  MG  --  1.7  --   PHOS  --  3.2  --    Liver Function Tests: No results for input(s): AST, ALT, ALKPHOS, BILITOT, PROT, ALBUMIN in the last 168 hours. No results for input(s): LIPASE, AMYLASE in the last 168 hours. No results for input(s): AMMONIA in the last 168 hours. CBC:  Recent Labs Lab 01/30/15 0625 01/31/15 0504  WBC 5.5 6.8  NEUTROABS 3.3  --   HGB 12.2 10.8*  HCT 40.3 34.5*  MCV 95.5 94.3  PLT 259 212   Cardiac Enzymes: No results for input(s): CKTOTAL, CKMB, CKMBINDEX, TROPONINI in the last 168 hours. BNP (last 3 results)  Recent Labs  10/16/14 1447 11/01/14 1535 01/30/15 0625  BNP 142.3* 229.4* 181.4*    ProBNP (last 3 results) No results for input(s): PROBNP in the last 8760 hours.  CBG: No results for input(s): GLUCAP in the last 168 hours.  Recent Results (from the past 240 hour(s))  Urine culture     Status: None   Collection Time: 01/30/15  8:01 AM  Result Value  Ref Range Status   Specimen Description URINE, CLEAN CATCH  Final   Special Requests NONE  Final   Culture   Final    MULTIPLE SPECIES PRESENT, SUGGEST RECOLLECTION Performed at [facility]    Report Status 01/31/2015 FINAL  Final  Culture, sputum-assessment     Status: None   Collection Time: 01/31/15  9:42 AM  Result Value Ref Range Status   Specimen Description SPUTUM  Final   Special Requests Immunocompromised  Final   Sputum evaluation   Final    THIS SPECIMEN IS ACCEPTABLE. RESPIRATORY CULTURE REPORT TO FOLLOW.   Report Status 01/31/2015 FINAL  Final     Studies: Dg Chest 2 View  01/30/2015  CLINICAL DATA:  Shortness of  breath and cough EXAM: CHEST  2 VIEW COMPARISON:  January 21, 2015 FINDINGS: There is persistent airspace opacity in the superior lingular region. Lungs elsewhere clear. Heart size and pulmonary vascularity are normal. No adenopathy. There is mid thoracic levoscoliosis. IMPRESSION: Persistent lingular infiltrate. No new opacity. No change in cardiac silhouette. Additional follow-up study in approximately 10-14 days is advised to assess for clearing. If this opacity persists at that time, noncontrast enhanced chest CT would be advised to assess for possible underlying mass. Electronically Signed   By: Lowella Grip III M.D.   On: 01/30/2015 07:00   Ct Angio Chest Pe W/cm &/or Wo Cm  01/30/2015  CLINICAL DATA:  Shortness of breath and recent pneumonia diagnosis EXAM: CT ANGIOGRAPHY CHEST WITH CONTRAST TECHNIQUE: Multidetector CT imaging of the chest was performed using the standard protocol during bolus administration of intravenous contrast. Multiplanar CT image reconstructions and MIPs were obtained to evaluate the vascular anatomy. CONTRAST:  174m OMNIPAQUE IOHEXOL 350 MG/ML SOLN COMPARISON:  10/15/2024 FINDINGS: The lungs are well aerated bilaterally with the exception of focal consolidation within the lingula similar to that seen on the prior plain film examination. No sizable effusion or pneumothorax is noted. The thoracic inlet is within normal limits. The thoracic aorta demonstrates atherosclerotic change without evidence of dissection. Stable dilatation in the descending aorta is noted to 3.9 cm. Scattered small hilar and mediastinal lymph nodes are identified likely reactive in nature given lingular infiltrate. Calcified subcarinal lymph nodes are seen consistent with prior granulomatous disease. Pulmonary artery demonstrates a normal branching pattern. No focally filling defect to suggest pulmonary embolism is identified. Cardiac structures show mild coronary calcifications as well as  calcifications of the aortic valve. Scanning into the upper abdomen shows no acute abnormality. The osseous structures show chronic compression deformity at T3. Progressive compression deformities at T5 and T6 are noted. This may contribute to the patient's underlying symptomatology. Review of the MIP images confirms the above findings. IMPRESSION: No evidence of pulmonary emboli. Lingular infiltrate similar to that seen on recent plain film. Changes of prior granulomatous disease. Increasing compression deformities at T5 and T6. Increased kyphosis is noted at this level as well. Electronically Signed   By: MInez CatalinaM.D.   On: 01/30/2015 08:59    Scheduled Meds: . budesonide-formoterol  2 puff Inhalation BID  . busPIRone  10 mg Oral TID  . diltiazem  120 mg Oral 3 times per day  . doxycycline (VIBRAMYCIN) IV  100 mg Intravenous Q12H  . FLUoxetine  60 mg Oral Daily  . [START ON 02/01/2015] furosemide  40 mg Oral Daily  . pantoprazole  40 mg Oral Daily  . potassium chloride SA  20 mEq Oral Daily  . sodium chloride  3 mL Intravenous Q12H  . tiotropium  18 mcg Inhalation Daily   Continuous Infusions:    Time spent: > 35 minutes   Velvet Bathe  [facility] Pager [phone] If 7PM-7AM, please contact night-coverage at [email or web address], password [facility] 01/31/2015, 1:58 PM  LOS: 0 days

## 2015-01-31 NOTE — Progress Notes (Signed)
Subjective: Continues with SOB and rt shoulder pain that is chronic.   Insurance no longer pays for long acting diltiazem she is will to take more than once a day,   Objective: Vital signs in last 24 hours: Temp:  [97.4 F (36.3 C)-98.1 F (36.7 C)] 97.6 F (36.4 C) (12/22 0421) Pulse Rate:  [77-120] 81 (12/22 0421) Resp:  [15-39] 18 (12/22 0421) BP: (103-167)/(61-115) 125/69 mmHg (12/22 0421) SpO2:  [89 %-100 %] 96 % (12/22 0421) Weight:  [174 lb (78.926 kg)] 174 lb (78.926 kg) (12/21 1551) Weight change:  Last BM Date: 01/31/15 (per patient. ) Intake/Output from previous day: +750 12/21 0701 - 12/22 0700 In: 1040 [P.O.:480; I.V.:60; IV Piggyback:500] Out: -  Intake/Output this shift: Total I/O In: 480 [P.O.:480] Out: -   PE: General:Pleasant affect, NAD Skin:Warm and dry, brisk capillary refill HEENT:normocephalic, sclera clear, mucus membranes moist Heart:irreg irreg without murmur, gallup, rub or click Lungs: without rales, + rhonchi, +  wheezes PRF:FMBW, non tender, + BS, do not palpate liver spleen or masses Ext:no lower ext edema, 2+ pedal pulses, 2+ radial pulses Neuro:alert and oriented X 3, MAE, follows commands, + facial symmetry Tele:  A fib rate controlled on IV dilt at 5    Lab Results:  Recent Labs  01/30/15 0625 01/31/15 0504  WBC 5.5 6.8  HGB 12.2 10.8*  HCT 40.3 34.5*  PLT 259 212   BMET  Recent Labs  01/30/15 0625 01/31/15 0504  NA 141 138  K 4.0 4.1  CL 102 102  CO2 30 29  GLUCOSE 97 112*  BUN 21* 16  CREATININE 0.63 0.54  CALCIUM 9.0 8.9   No results for input(s): TROPONINI in the last 72 hours.  Invalid input(s): CK, MB  Lab Results  Component Value Date   CHOL 164 07/06/2014   HDL 47 07/06/2014   LDLCALC 99 07/06/2014   TRIG 89 07/06/2014   CHOLHDL 3.5 07/06/2014   Lab Results  Component Value Date   HGBA1C 5.4 07/06/2014     Lab Results  Component Value Date   TSH 0.295* 01/30/2015    Hepatic  Function Panel No results for input(s): PROT, ALBUMIN, AST, ALT, ALKPHOS, BILITOT, BILIDIR, IBILI in the last 72 hours. No results for input(s): CHOL in the last 72 hours. No results for input(s): PROTIME in the last 72 hours.     Studies/Results: Dg Chest 2 View  01/30/2015  CLINICAL DATA:  Shortness of breath and cough EXAM: CHEST  2 VIEW COMPARISON:  January 21, 2015 FINDINGS: There is persistent airspace opacity in the superior lingular region. Lungs elsewhere clear. Heart size and pulmonary vascularity are normal. No adenopathy. There is mid thoracic levoscoliosis. IMPRESSION: Persistent lingular infiltrate. No new opacity. No change in cardiac silhouette. Additional follow-up study in approximately 10-14 days is advised to assess for clearing. If this opacity persists at that time, noncontrast enhanced chest CT would be advised to assess for possible underlying mass. Electronically Signed   By: Lowella Grip III M.D.   On: 01/30/2015 07:00   Ct Angio Chest Pe W/cm &/or Wo Cm  01/30/2015  CLINICAL DATA:  Shortness of breath and recent pneumonia diagnosis EXAM: CT ANGIOGRAPHY CHEST WITH CONTRAST TECHNIQUE: Multidetector CT imaging of the chest was performed using the standard protocol during bolus administration of intravenous contrast. Multiplanar CT image reconstructions and MIPs were obtained to evaluate the vascular anatomy. CONTRAST:  165m OMNIPAQUE IOHEXOL 350 MG/ML SOLN COMPARISON:  10/15/2024 FINDINGS: The lungs are well aerated bilaterally with the exception of focal consolidation within the lingula similar to that seen on the prior plain film examination. No sizable effusion or pneumothorax is noted. The thoracic inlet is within normal limits. The thoracic aorta demonstrates atherosclerotic change without evidence of dissection. Stable dilatation in the descending aorta is noted to 3.9 cm. Scattered small hilar and mediastinal lymph nodes are identified likely reactive in nature  given lingular infiltrate. Calcified subcarinal lymph nodes are seen consistent with prior granulomatous disease. Pulmonary artery demonstrates a normal branching pattern. No focally filling defect to suggest pulmonary embolism is identified. Cardiac structures show mild coronary calcifications as well as calcifications of the aortic valve. Scanning into the upper abdomen shows no acute abnormality. The osseous structures show chronic compression deformity at T3. Progressive compression deformities at T5 and T6 are noted. This may contribute to the patient's underlying symptomatology. Review of the MIP images confirms the above findings. IMPRESSION: No evidence of pulmonary emboli. Lingular infiltrate similar to that seen on recent plain film. Changes of prior granulomatous disease. Increasing compression deformities at T5 and T6. Increased kyphosis is noted at this level as well. Electronically Signed   By: Inez Catalina M.D.   On: 01/30/2015 08:59    Medications: I have reviewed the patient's current medications. Scheduled Meds: . budesonide-formoterol  2 puff Inhalation BID  . busPIRone  10 mg Oral TID  . doxycycline (VIBRAMYCIN) IV  100 mg Intravenous Q12H  . FLUoxetine  60 mg Oral Daily  . [START ON 02/01/2015] furosemide  40 mg Oral Daily  . pantoprazole  40 mg Oral Daily  . potassium chloride SA  20 mEq Oral Daily  . sodium chloride  3 mL Intravenous Q12H  . tiotropium  18 mcg Inhalation Daily   Continuous Infusions: . diltiazem (CARDIZEM) infusion 5 mg/hr (01/31/15 0559)   PRN Meds:.sodium chloride, acetaminophen **OR** acetaminophen, albuterol, oxyCODONE-acetaminophen, sodium chloride  Assessment/Plan: 69 year old woman with permanent atrial fibrillation and history of recent community-acquired pneumonia presents with increasing shortness of breath.    1. Permanent atrial fibrillation. Chads score of 4. She refuses anticoagulation. She treats herself with aspirin. -negative  troponin -improved rate control- change to po dilt at short acting tablet 120 mg every 8 hours will ask care manager to make sure her insurance will pay  2. COPD on home oxygen.  3. Persistent lingular infiltrate on chest x-ray and CT scan  4. Chronic right shoulder pain  Time spent with pt. :15 minutes. [facility]  Nurse Practitioner Certified Pager [phone] or after 5pm and on weekends call [phone] 01/31/2015, 10:52 AM  Agree with above. Starting on diltiazem 120 mg q8h. After first dose of oral diltiazem her IV diltiazem can be stopped. Otherwise stable. Cardiology will sign off now. She has an appt. To see [name] in January

## 2015-02-01 ENCOUNTER — Other Ambulatory Visit: Payer: Self-pay | Admitting: *Deleted

## 2015-02-01 MED ORDER — ASPIRIN 81 MG PO TBEC: 81.0000 mg | DELAYED_RELEASE_TABLET | Freq: Every day | ORAL | Status: DC

## 2015-02-01 MED ORDER — DILTIAZEM HCL 120 MG PO TABS: 120.0000 mg | ORAL_TABLET | Freq: Three times a day (TID) | ORAL | Status: DC

## 2015-02-01 NOTE — Consult Note (Addendum)
   [facility] CM Inpatient Consult   02/01/2015  SHARLIZE HOAR Jul 09, 1945 [id number]   EPIC referral for [facility] services. Came to bedside to speak with Ms. Keeling about [facility]. She is familiar with [facility] as she has been active with [facility] in the past. She is agreeable to be followed again by [facility] services. Written consent obtained. Explained to her that she will receive post hospital transition of care calls and will be evaluated for monthly home visits. She endorses she has a different Primary Care MD who is not [facility] affiliated. However, she remains on the Medicare list of patients that are eligible for [facility] services. Discussed that it will be important for her to contact Medicare to change to her current  Primary Care MD. States it is supposed to be changed as she has already notified them. Discussed that she may not be eligible for [facility] in January. She understands this. However, she will have post hospital follow up in the interim from [facility]. Left [facility] Care Management packet and contact information at bedside. Appreciative of visit. Spoke with inpatient RNCM about bedside visit.  [name], MSN-Ed, RN,BSN [facility] Liaison [phone]

## 2015-02-01 NOTE — Discharge Summary (Signed)
Physician Discharge Summary  Kelsey Garcia IOE:[id number] DOB: August 27, 1945 DOA: 01/30/2015  PCP: Darden Amber, PA  Admit date: 01/30/2015 Discharge date: 02/01/2015  Time spent: > 35 minutes  Recommendations for Outpatient Follow-up:  1. Please monitor hgb levels 2. Ensure patient f/u with cardiology as outpatient. 3. Monitor blood sugar and consider hgba1c levels   Discharge Diagnoses:  Principal Problem:   Atrial fibrillation with RVR ([place]) Active Problems:   HLD (hyperlipidemia)   Essential hypertension   Atrial fibrillation with rapid ventricular response (HCC)   COPD (chronic obstructive pulmonary disease) (HCC)   GERD (gastroesophageal reflux disease)   COPD exacerbation ([place])   Discharge Condition: stable  Diet recommendation: Heart healthy  Filed Weights   01/30/15 1551 01/31/15 1800 02/01/15 0500  Weight: 78.926 kg (174 lb) 78.926 kg (174 lb) 77.5 kg (170 lb 13.7 oz)    History of present illness:  From original HPI: 69 y.o. female  With history of recent pneumonia, COPD, atrial fibrillation. Presenting with 3 day complaint of worsening shortness of breath. Problem has been persistent and progressively getting worse since onset  Hospital Course:  Atrial fibrillation with RVR [facility]) - Cardiology managed while patient was in house. Continue in house rate controlling regimen on discharge. - As per cardiology note patient refuses anticoagulation patient is currently preferring to be on aspirin   Pneumonia -Patient refuses Levaquin as such place patient on doxycycline.  Active Problems:  HLD (hyperlipidemia) - Stable, no chest pain reported   Essential hypertension -Patient is currently on Cardizem   COPD (chronic obstructive pulmonary disease) ([place]) - continue home medication regimen and doxycycline.   GERD (gastroesophageal reflux disease) -Stable currently we'll continue PPI   Procedures:  None  Consultations:  Cardiology  Discharge  Exam: Filed Vitals:   02/01/15 0539 02/01/15 1500  BP: 108/80 120/61  Pulse: 67 71  Temp: 97.7 F (36.5 C) 98.2 F (36.8 C)  Resp: 20 18    General: Pt in nad, alert and awake Cardiovascular: rrr, no mrg Respiratory: cta bl, no wheezes  Discharge Instructions   Discharge Instructions    AMB Referral to [place] [facility]    Complete by:  As directed   EPIC referral for [facility] services. Attributed to [facility] on APL.  Please assign to [place] for COPD disease management. Written consent obtained. Please call with question. [name], MSN-Ed, RN,BSN [facility] Liaison-[phone]  Reason for consult:  Please assign to [facility] RNCM  Diagnoses of:  COPD/ Pneumonia  Expected date of contact:  1-3 days (reserved for hospital discharges)     Call MD for:  difficulty breathing, headache or visual disturbances    Complete by:  As directed      Call MD for:  redness, tenderness, or signs of infection (pain, swelling, redness, odor or green/yellow discharge around incision site)    Complete by:  As directed      Call MD for:  severe uncontrolled pain    Complete by:  As directed      Call MD for:  temperature >100.4    Complete by:  As directed      Diet - low sodium heart healthy    Complete by:  As directed      Discharge instructions    Complete by:  As directed   Please follow up with your primary care physician in 1-2 weeks or sooner should any new concerns arise.     Increase activity slowly    Complete  by:  As directed           Current Discharge Medication List    START taking these medications   Details  aspirin EC 81 MG EC tablet Take 1 tablet (81 mg total) by mouth daily. Qty: 30 tablet, Refills: 0    diltiazem (CARDIZEM) 120 MG tablet Take 1 tablet (120 mg total) by mouth every 8 (eight) hours. Qty: 90 tablet, Refills: 0      CONTINUE these medications which have NOT CHANGED   Details  albuterol (PROVENTIL HFA;VENTOLIN HFA) 108 (90 BASE) MCG/ACT  inhaler Inhale 1-2 puffs into the lungs every 6 (six) hours as needed for wheezing. Qty: 1 Inhaler, Refills: 3    albuterol (PROVENTIL) (2.5 MG/3ML) 0.083% nebulizer solution Take 3 mLs (2.5 mg total) by nebulization every 6 (six) hours as needed for wheezing. Qty: 75 mL, Refills: 1    busPIRone (BUSPAR) 10 MG tablet Take 10 mg by mouth 3 (three) times daily.    doxycycline (VIBRAMYCIN) 100 MG capsule Take 1 capsule (100 mg total) by mouth 2 (two) times daily. Qty: 20 capsule, Refills: 0    FLUoxetine (PROZAC) 20 MG capsule TAKE THREE CAPSULES BY MOUTH ONCE DAILY Qty: 90 capsule, Refills: 5    furosemide (LASIX) 20 MG tablet Take 40 mg by mouth daily.     guaiFENesin (MUCINEX) 600 MG 12 hr tablet Take 600 mg by mouth 2 (two) times daily as needed for cough.    ipratropium-albuterol (DUONEB) 0.5-2.5 (3) MG/3ML SOLN Take 3 mLs by nebulization 3 (three) times daily. Qty: 360 mL, Refills: 3    KLOR-CON M20 20 MEQ tablet Take 1 tablet by mouth daily.     NON FORMULARY Place 1 drop into both eyes 2 (two) times daily as needed (hurting, dry). thera- tears    nystatin (MYCOSTATIN) 100000 UNIT/ML suspension Take 5 mLs by mouth daily as needed. FOR THRUSH    oxyCODONE-acetaminophen (PERCOCET/ROXICET) 5-325 MG per tablet Take 1 tablet by mouth every 8 (eight) hours as needed for severe pain.     pantoprazole (PROTONIX) 40 MG tablet TAKE ONE TABLET BY MOUTH TWICE DAILY Qty: 60 tablet, Refills: 5    SYMBICORT 160-4.5 MCG/ACT inhaler INHALE TWO PUFFS BY MOUTH TWICE DAILY Qty: 10.2 g, Refills: 2    tiotropium (SPIRIVA) 18 MCG inhalation capsule Place 1 capsule (18 mcg total) into inhaler and inhale daily. Qty: 90 capsule, Refills: 1   Associated Diagnoses: Chronic obstructive pulmonary disease, unspecified COPD, unspecified chronic bronchitis type      STOP taking these medications     diltiazem (TIAZAC) 420 MG 24 hr capsule      HYDROcodone-homatropine (HYCODAN) 5-1.5 MG/5ML syrup       metoprolol succinate (TOPROL-XL) 25 MG 24 hr tablet      predniSONE (DELTASONE) 10 MG tablet        Allergies  Allergen Reactions  . Ace Inhibitors Cough  . Pseudoeph-Doxylamine-Dm-Apap Itching  . Diphenhydramine Hcl Other (See Comments)    "feels like my skin is crawling, and legs twitches"  . Erythromycin Diarrhea  . Nsaids Nausea And Vomiting  . Nyquil [Pseudoeph-Doxylamine-Dm-Apap] Itching  . Tramadol Hcl Other (See Comments)    stomach pain, hallucinations  . Codeine Swelling    Face and lips swelling  Patient report Can tolerate hydrocodone  . Fosamax [Alendronate] Other (See Comments)    Nausea and abdominal bloating.   . Tomato Other (See Comments)    Stomach swells  . Orange Fruit [Citrus] Rash  .  Pseudoephedrine Palpitations   Follow-up Information    Follow up with Darden Amber, PA In 1 week.   Contact information:   [address] [place] [address] [phone]       Follow up with [place] DEPT.   Specialty:  Emergency Medicine   Why:  If symptoms worsen   Contact information:   [place] [phone] [place] [phone]       The results of significant diagnostics from this hospitalization (including imaging, microbiology, ancillary and laboratory) are listed below for reference.    Significant Diagnostic Studies: Dg Chest 2 View  01/30/2015  CLINICAL DATA:  Shortness of breath and cough EXAM: CHEST  2 VIEW COMPARISON:  January 21, 2015 FINDINGS: There is persistent airspace opacity in the superior lingular region. Lungs elsewhere clear. Heart size and pulmonary vascularity are normal. No adenopathy. There is mid thoracic levoscoliosis. IMPRESSION: Persistent lingular infiltrate. No new opacity. No change in cardiac silhouette. Additional follow-up study in approximately 10-14 days is advised to assess for clearing. If this opacity persists at that time,  noncontrast enhanced chest CT would be advised to assess for possible underlying mass. Electronically Signed   By: Lowella Grip III M.D.   On: 01/30/2015 07:00   Dg Chest 2 View  01/21/2015  CLINICAL DATA:  Shortness of breath EXAM: CHEST  2 VIEW COMPARISON:  11/06/2014 FINDINGS: Airspace disease noted in the left lingula concerning for pneumonia. Right lung is clear. No effusions. Heart is borderline in size. IMPRESSION: Lingular airspace opacity concerning for pneumonia. Followup PA and lateral chest X-ray is recommended in 3-4 weeks following trial of antibiotic therapy to ensure resolution and exclude underlying malignancy. Electronically Signed   By: Rolm Baptise M.D.   On: 01/21/2015 12:56   Ct Angio Chest Pe W/cm &/or Wo Cm  01/30/2015  CLINICAL DATA:  Shortness of breath and recent pneumonia diagnosis EXAM: CT ANGIOGRAPHY CHEST WITH CONTRAST TECHNIQUE: Multidetector CT imaging of the chest was performed using the standard protocol during bolus administration of intravenous contrast. Multiplanar CT image reconstructions and MIPs were obtained to evaluate the vascular anatomy. CONTRAST:  126m OMNIPAQUE IOHEXOL 350 MG/ML SOLN COMPARISON:  10/15/2024 FINDINGS: The lungs are well aerated bilaterally with the exception of focal consolidation within the lingula similar to that seen on the prior plain film examination. No sizable effusion or pneumothorax is noted. The thoracic inlet is within normal limits. The thoracic aorta demonstrates atherosclerotic change without evidence of dissection. Stable dilatation in the descending aorta is noted to 3.9 cm. Scattered small hilar and mediastinal lymph nodes are identified likely reactive in nature given lingular infiltrate. Calcified subcarinal lymph nodes are seen consistent with prior granulomatous disease. Pulmonary artery demonstrates a normal branching pattern. No focally filling defect to suggest pulmonary embolism is identified. Cardiac structures  show mild coronary calcifications as well as calcifications of the aortic valve. Scanning into the upper abdomen shows no acute abnormality. The osseous structures show chronic compression deformity at T3. Progressive compression deformities at T5 and T6 are noted. This may contribute to the patient's underlying symptomatology. Review of the MIP images confirms the above findings. IMPRESSION: No evidence of pulmonary emboli. Lingular infiltrate similar to that seen on recent plain film. Changes of prior granulomatous disease. Increasing compression deformities at T5 and T6. Increased kyphosis is noted at this level as well. Electronically Signed   By: MInez CatalinaM.D.   On: 01/30/2015 08:59  Microbiology: Recent Results (from the past 240 hour(s))  Culture, blood (Routine X 2) w Reflex to ID Panel     Status: None (Preliminary result)   Collection Time: 01/30/15  6:25 AM  Result Value Ref Range Status   Specimen Description BLOOD RIGHT ARM  Final   Special Requests BOTTLES DRAWN AEROBIC AND ANAEROBIC 5CC  Final   Culture   Final    NO GROWTH 2 DAYS Performed at [facility]    Report Status PENDING  Incomplete  Culture, blood (Routine X 2) w Reflex to ID Panel     Status: None (Preliminary result)   Collection Time: 01/30/15  7:02 AM  Result Value Ref Range Status   Specimen Description BLOOD LEFT ARM  Final   Special Requests BOTTLES DRAWN AEROBIC AND ANAEROBIC 5ML  Final   Culture   Final    NO GROWTH 2 DAYS Performed at [facility]    Report Status PENDING  Incomplete  Urine culture     Status: None   Collection Time: 01/30/15  8:01 AM  Result Value Ref Range Status   Specimen Description URINE, CLEAN CATCH  Final   Special Requests NONE  Final   Culture   Final    MULTIPLE SPECIES PRESENT, SUGGEST RECOLLECTION Performed at [facility]    Report Status 01/31/2015 FINAL  Final  Culture, sputum-assessment     Status: None   Collection Time: 01/31/15   9:42 AM  Result Value Ref Range Status   Specimen Description SPUTUM  Final   Special Requests Immunocompromised  Final   Sputum evaluation   Final    THIS SPECIMEN IS ACCEPTABLE. RESPIRATORY CULTURE REPORT TO FOLLOW.   Report Status 01/31/2015 FINAL  Final  Culture, respiratory (NON-Expectorated)     Status: None (Preliminary result)   Collection Time: 01/31/15  9:42 AM  Result Value Ref Range Status   Specimen Description SPUTUM  Final   Special Requests NONE  Final   Gram Stain   Final    FEW WBC PRESENT, PREDOMINANTLY PMN FEW SQUAMOUS EPITHELIAL CELLS PRESENT RARE GRAM POSITIVE RODS RARE GRAM POSITIVE COCCI IN PAIRS    Culture PENDING  Incomplete   Report Status PENDING  Incomplete     Labs: Basic Metabolic Panel:  Recent Labs Lab 01/30/15 0625 01/30/15 1640 01/31/15 0504  NA 141  --  138  K 4.0  --  4.1  CL 102  --  102  CO2 30  --  29  GLUCOSE 97  --  112*  BUN 21*  --  16  CREATININE 0.63  --  0.54  CALCIUM 9.0  --  8.9  MG  --  1.7  --   PHOS  --  3.2  --    Liver Function Tests: No results for input(s): AST, ALT, ALKPHOS, BILITOT, PROT, ALBUMIN in the last 168 hours. No results for input(s): LIPASE, AMYLASE in the last 168 hours. No results for input(s): AMMONIA in the last 168 hours. CBC:  Recent Labs Lab 01/30/15 0625 01/31/15 0504  WBC 5.5 6.8  NEUTROABS 3.3  --   HGB 12.2 10.8*  HCT 40.3 34.5*  MCV 95.5 94.3  PLT 259 212   Cardiac Enzymes: No results for input(s): CKTOTAL, CKMB, CKMBINDEX, TROPONINI in the last 168 hours. BNP: BNP (last 3 results)  Recent Labs  10/16/14 1447 11/01/14 1535 01/30/15 0625  BNP 142.3* 229.4* 181.4*    ProBNP (last 3 results) No results for input(s):  PROBNP in the last 8760 hours.  CBG: No results for input(s): GLUCAP in the last 168 hours.     Signed:  Velvet Bathe MD  FACP  [facility] 02/01/2015, 4:08 PM

## 2015-02-03 LAB — CULTURE, RESPIRATORY W GRAM STAIN

## 2015-02-03 LAB — CULTURE, RESPIRATORY: CULTURE: NORMAL

## 2015-02-04 ENCOUNTER — Emergency Department (HOSPITAL_COMMUNITY): Payer: Medicare Other

## 2015-02-04 ENCOUNTER — Encounter (HOSPITAL_COMMUNITY): Payer: Self-pay | Admitting: Emergency Medicine

## 2015-02-04 ENCOUNTER — Emergency Department (HOSPITAL_COMMUNITY)
Admission: EM | Admit: 2015-02-04 | Discharge: 2015-02-04 | Disposition: A | Discharge status: Home / Self Care | Payer: Medicare Other | Attending: Emergency Medicine | Admitting: Emergency Medicine

## 2015-02-04 DIAGNOSIS — Z8639 Personal history of other endocrine, nutritional and metabolic disease: Secondary | ICD-10-CM | POA: Insufficient documentation

## 2015-02-04 DIAGNOSIS — J441 Chronic obstructive pulmonary disease with (acute) exacerbation: Secondary | ICD-10-CM | POA: Insufficient documentation

## 2015-02-04 DIAGNOSIS — Z7982 Long term (current) use of aspirin: Secondary | ICD-10-CM | POA: Diagnosis not present

## 2015-02-04 DIAGNOSIS — Z792 Long term (current) use of antibiotics: Secondary | ICD-10-CM | POA: Diagnosis not present

## 2015-02-04 DIAGNOSIS — K219 Gastro-esophageal reflux disease without esophagitis: Secondary | ICD-10-CM | POA: Diagnosis not present

## 2015-02-04 DIAGNOSIS — J159 Unspecified bacterial pneumonia: Secondary | ICD-10-CM | POA: Insufficient documentation

## 2015-02-04 DIAGNOSIS — J189 Pneumonia, unspecified organism: Secondary | ICD-10-CM

## 2015-02-04 DIAGNOSIS — Z79899 Other long term (current) drug therapy: Secondary | ICD-10-CM | POA: Diagnosis not present

## 2015-02-04 DIAGNOSIS — Z87891 Personal history of nicotine dependence: Secondary | ICD-10-CM | POA: Diagnosis not present

## 2015-02-04 DIAGNOSIS — M159 Polyosteoarthritis, unspecified: Secondary | ICD-10-CM | POA: Insufficient documentation

## 2015-02-04 DIAGNOSIS — Z7952 Long term (current) use of systemic steroids: Secondary | ICD-10-CM | POA: Insufficient documentation

## 2015-02-04 DIAGNOSIS — Z7951 Long term (current) use of inhaled steroids: Secondary | ICD-10-CM | POA: Insufficient documentation

## 2015-02-04 DIAGNOSIS — F329 Major depressive disorder, single episode, unspecified: Secondary | ICD-10-CM | POA: Diagnosis not present

## 2015-02-04 DIAGNOSIS — I10 Essential (primary) hypertension: Secondary | ICD-10-CM | POA: Insufficient documentation

## 2015-02-04 DIAGNOSIS — F419 Anxiety disorder, unspecified: Secondary | ICD-10-CM | POA: Diagnosis not present

## 2015-02-04 DIAGNOSIS — F429 Obsessive-compulsive disorder, unspecified: Secondary | ICD-10-CM | POA: Diagnosis not present

## 2015-02-04 DIAGNOSIS — Z8781 Personal history of (healed) traumatic fracture: Secondary | ICD-10-CM | POA: Insufficient documentation

## 2015-02-04 DIAGNOSIS — I482 Chronic atrial fibrillation, unspecified: Secondary | ICD-10-CM

## 2015-02-04 DIAGNOSIS — R0602 Shortness of breath: Secondary | ICD-10-CM | POA: Diagnosis present

## 2015-02-04 DIAGNOSIS — Z8669 Personal history of other diseases of the nervous system and sense organs: Secondary | ICD-10-CM | POA: Diagnosis not present

## 2015-02-04 DIAGNOSIS — I509 Heart failure, unspecified: Secondary | ICD-10-CM | POA: Diagnosis not present

## 2015-02-04 LAB — CBC
HCT: 36.6 % (ref 36.0–46.0)
Hemoglobin: 11.1 g/dL — ABNORMAL LOW (ref 12.0–15.0)
MCH: 29.1 pg (ref 26.0–34.0)
MCHC: 30.3 g/dL (ref 30.0–36.0)
MCV: 96.1 fL (ref 78.0–100.0)
PLATELETS: 191 10*3/uL (ref 150–400)
RBC: 3.81 MIL/uL — AB (ref 3.87–5.11)
RDW: 15.3 % (ref 11.5–15.5)
WBC: 8.6 10*3/uL (ref 4.0–10.5)

## 2015-02-04 LAB — BASIC METABOLIC PANEL
ANION GAP: 11 (ref 5–15)
BUN: 20 mg/dL (ref 6–20)
CO2: 29 mmol/L (ref 22–32)
Calcium: 8.8 mg/dL — ABNORMAL LOW (ref 8.9–10.3)
Chloride: 103 mmol/L (ref 101–111)
Creatinine, Ser: 0.76 mg/dL (ref 0.44–1.00)
GLUCOSE: 137 mg/dL — AB (ref 65–99)
POTASSIUM: 3.7 mmol/L (ref 3.5–5.1)
SODIUM: 143 mmol/L (ref 135–145)

## 2015-02-04 LAB — I-STAT TROPONIN, ED: TROPONIN I, POC: 0 ng/mL (ref 0.00–0.08)

## 2015-02-04 LAB — BRAIN NATRIURETIC PEPTIDE: B NATRIURETIC PEPTIDE 5: 217.6 pg/mL — AB (ref 0.0–100.0)

## 2015-02-04 LAB — CULTURE, BLOOD (ROUTINE X 2)
CULTURE: NO GROWTH
Culture: NO GROWTH

## 2015-02-04 MED ORDER — AZITHROMYCIN 250 MG PO TABS: 250.0000 mg | ORAL_TABLET | Freq: Every day | ORAL | Status: DC

## 2015-02-04 MED ORDER — IPRATROPIUM-ALBUTEROL 0.5-2.5 (3) MG/3ML IN SOLN
3.0000 mL | Freq: Once | RESPIRATORY_TRACT | Status: AC
  Administered 2015-02-04: 3 mL via RESPIRATORY_TRACT
  Filled 2015-02-04: qty 3

## 2015-02-04 MED ORDER — PREDNISONE 20 MG PO TABS: 40.0000 mg | ORAL_TABLET | Freq: Every day | ORAL | Status: DC

## 2015-02-04 MED ORDER — OXYCODONE-ACETAMINOPHEN 5-325 MG PO TABS
1.0000 | ORAL_TABLET | Freq: Once | ORAL | Status: AC
  Administered 2015-02-04: 1 via ORAL
  Filled 2015-02-04: qty 1

## 2015-02-04 NOTE — ED Notes (Signed)
Bed: WA09 Expected date:  Expected time:  Means of arrival:  Comments: Ems-sob

## 2015-02-04 NOTE — ED Notes (Signed)
Pt received '125mg'$  of solumedrol by EMS and neb tx: 10 albuterol, 0.5 Atrovent.

## 2015-02-04 NOTE — ED Notes (Signed)
Pt states she is currently on 2L O2 by [place] at home.

## 2015-02-04 NOTE — Discharge Instructions (Signed)
Take medications as prescribed. Follow up with your doctor in 2 days in regards to your hospital visit. You may return to the emergency department if symptoms worsen, become progressive, or become more concerning.  Pneumonia, Adult Pneumonia is an infection of the lungs.   CAUSES Pneumonia may be caused by bacteria or a virus. Usually, these infections are caused by breathing infectious particles into the lungs (respiratory tract).  SYMPTOMS   Cough.   Fever.   Chest pain.   Increased rate of breathing.   Wheezing.   Mucus production.   DIAGNOSIS  If you have the common symptoms of pneumonia, your caregiver will typically confirm the diagnosis with a chest X-ray. The X-ray will show an abnormality in the lung (pulmonary infiltrate) if you have pneumonia. Other tests of your blood, urine, or sputum may be done to find the specific cause of your pneumonia. Your caregiver may also do tests (blood gases or pulse oximetry) to see how well your lungs are working.  TREATMENT  Some forms of pneumonia may be spread to other people when you cough or sneeze. You may be asked to wear a mask before and during your exam. Pneumonia that is caused by bacteria is treated with antibiotic medicine. Pneumonia that is caused by the influenza virus may be treated with an antiviral medicine. Most other viral infections must run their course. These infections will not respond to antibiotics.   PREVENTION A pneumococcal shot (vaccine) is available to prevent a common bacterial cause of pneumonia. This is usually suggested for:  People over 18 years old.   Patients on chemotherapy.   People with chronic lung problems, such as bronchitis or emphysema.   People with immune system problems.  If you are over 65 or have a high risk condition, you may receive the pneumococcal vaccine if you have not received it before. In some countries, a routine influenza vaccine is also recommended. This vaccine can help  prevent some cases of pneumonia.You may be offered the influenza vaccine as part of your care. If you smoke, it is time to quit. You may receive instructions on how to stop smoking. Your caregiver can provide medicines and counseling to help you quit.  HOME CARE INSTRUCTIONS   Cough suppressants may be used if you are losing too much rest. However, coughing protects you by clearing your lungs. You should avoid using cough suppressants if you can.   Your caregiver may have prescribed medicine if he or she thinks your pneumonia is caused by a bacteria or influenza. Finish your medicine even if you start to feel better.   Your caregiver may also prescribe an expectorant. This loosens the mucus to be coughed up.   Only take over-the-counter or prescription medicines for pain, discomfort, or fever as directed by your caregiver.   Do not smoke. Smoking is a common cause of bronchitis and can contribute to pneumonia. If you are a smoker and continue to smoke, your cough may last several weeks after your pneumonia has cleared.   A cold steam vaporizer or humidifier in your room or home may help loosen mucus.   Coughing is often worse at night. Sleeping in a semi-upright position in a recliner or using a couple pillows under your head will help with this.   Get rest as you feel it is needed. Your body will usually let you know when you need to rest.   SEEK IMMEDIATE MEDICAL CARE IF:   Your illness becomes worse. This is  especially true if you are elderly or weakened from any other disease.   You cannot control your cough with suppressants and are losing sleep.   You begin coughing up blood.   You develop pain which is getting worse or is uncontrolled with medicines.   You have a fever.   Any of the symptoms which initially brought you in for treatment are getting worse rather than better.   You develop shortness of breath or chest pain.   MAKE SURE YOU:   Understand these instructions.    Will watch your condition.   Will get help right away if you are not doing well or get worse.

## 2015-02-04 NOTE — ED Notes (Signed)
Pt recently d/c from [place] on 12/24 with pneumonia, pt c/o worsening SOB and wheezing.

## 2015-02-04 NOTE — ED Notes (Signed)
Patient transported to X-ray 

## 2015-02-04 NOTE — ED Provider Notes (Signed)
CSN: [id number]     Arrival date & time 02/04/15  9983 History   First MD Initiated Contact with Patient 02/04/15 1005     Chief Complaint  Patient presents with  . Shortness of Breath     (Consider location/radiation/quality/duration/timing/severity/associated sxs/prior Treatment) Patient is a 69 y.o. female presenting with shortness of breath. The history is provided by the patient and medical records. No language interpreter was used.  Shortness of Breath Associated symptoms: cough   Associated symptoms: no abdominal pain, no headaches, no neck pain, no rash, no sore throat and no vomiting    LOUNELL SCHUMACHER is a 69 y.o. female  with a PMH of afib, HTN, COPD, CHF who presents to the Emergency Department complaining of intermittent shortness of breath throughout the last 2 days associated with wheezing. Wears 2L o2 [place] at home. Denies chest pain, fever. Patient was recently discharged from hospital for PNA on 12/23. Per chart review, patient refused levoquin tx, and was therefore started on doxycyline upon discharge.  Also complaining of right shoulder pain which she states is a chronic issue.    Past Medical History  Diagnosis Date  . Atrial fibrillation ([place])   . Hypertension   . Insomnia   . Nonischemic cardiomyopathy ([place])   . Hyperlipidemia   . Anxiety   . Cholelithiasis   . Insomnia   . Mitral regurgitation     noted 2010  . H/O epistaxis   . Rhinitis, allergic   . CHF (congestive heart failure) ([place])   . Pneumonia     "several times w/exacerbations of the COPD; nothing in the last year" (07/15/2012)  . COPD (chronic obstructive pulmonary disease) ([place])     as of 7/13 on 2-3L, pfts 10/2008 with mod obstruction  . Chronic bronchitis with COPD (chronic obstructive pulmonary disease) ([place])   . Shortness of breath     "all the time right now" (07/15/2012)  . GERD (gastroesophageal reflux disease)   . JASNKNLZ(767.3)     "weekly" (07/15/2012)  . Migraines     "weekly for awhile;  cleared up as I got older" (07/15/2012)  . DJD (degenerative joint disease)   . Arthritis     "all over" (07/15/2012)  . OCD (obsessive compulsive disorder)   . OCD (obsessive compulsive disorder)   . Depression     h/o SI; "last time I was really serious about it was ~ 1997" (07/15/2012)  . Thoracic vertebral fracture ([place]) 11/03/2014   Past Surgical History  Procedure Laterality Date  . Tubal ligation  1972  . Cardioversion  2003; 07/2003   Family History  Problem Relation Age of Onset  . Asthma    . Emphysema    . Allergies    . Cancer      aunt had several types of cancer  . COPD Mother   . Emphysema Mother   . Cirrhosis Father    Social History  Substance Use Topics  . Smoking status: Former Smoker -- 1.00 packs/day for 35 years    Types: Cigarettes    Quit date: 06/18/2014  . Smokeless tobacco: Never Used  . Alcohol Use: No   OB History    No data available     Review of Systems  Constitutional: Negative.   HENT: Negative for congestion, rhinorrhea and sore throat.   Eyes: Negative for visual disturbance.  Respiratory: Positive for cough and shortness of breath. Negative for stridor.   Cardiovascular: Negative.   Gastrointestinal: Negative for nausea, vomiting,  abdominal pain, diarrhea and constipation.  Genitourinary: Negative for dysuria.  Musculoskeletal: Positive for arthralgias. Negative for back pain and neck pain.  Skin: Negative for rash.  Neurological: Negative for dizziness, weakness and headaches.      Allergies  Ace inhibitors; Pseudoeph-doxylamine-dm-apap; Diphenhydramine hcl; Erythromycin; Nsaids; Nyquil; Tramadol hcl; Codeine; Fosamax; Tomato; Orange fruit; and Pseudoephedrine  Home Medications   Prior to Admission medications   Medication Sig Start Date End Date Taking? Authorizing Provider  albuterol (PROVENTIL HFA;VENTOLIN HFA) 108 (90 BASE) MCG/ACT inhaler Inhale 1-2 puffs into the lungs every 6 (six) hours as needed for wheezing. 10/19/14   Yes Rigoberto Noel, MD  albuterol (PROVENTIL) (2.5 MG/3ML) 0.083% nebulizer solution Take 3 mLs (2.5 mg total) by nebulization every 6 (six) hours as needed for wheezing. 09/21/14  Yes Maryann Mikhail, DO  aspirin 325 MG EC tablet Take 650 mg by mouth daily.   Yes Historical Provider, MD  busPIRone (BUSPAR) 10 MG tablet Take 10 mg by mouth 3 (three) times daily. 04/22/14  Yes Historical Provider, MD  diltiazem (CARDIZEM) 120 MG tablet Take 1 tablet (120 mg total) by mouth every 8 (eight) hours. 02/01/15  Yes Velvet Bathe, MD  doxycycline (VIBRAMYCIN) 100 MG capsule Take 1 capsule (100 mg total) by mouth 2 (two) times daily. 01/21/15  Yes Hanna Patel-Mills, PA-C  FLUoxetine (PROZAC) 20 MG capsule TAKE THREE CAPSULES BY MOUTH ONCE DAILY 01/03/14  Yes Tasrif Ahmed, MD  furosemide (LASIX) 20 MG tablet Take 40 mg by mouth daily.    Yes Historical Provider, MD  guaiFENesin (MUCINEX) 600 MG 12 hr tablet Take 600 mg by mouth 2 (two) times daily as needed for cough.   Yes Historical Provider, MD  HYDROcodone-homatropine (HYCODAN) 5-1.5 MG/5ML syrup Take 2.5 mLs by mouth 2 (two) times daily as needed for cough.   Yes Historical Provider, MD  ipratropium-albuterol (DUONEB) 0.5-2.5 (3) MG/3ML SOLN Take 3 mLs by nebulization 3 (three) times daily. 09/21/14  Yes Maryann Mikhail, DO  KLOR-CON M20 20 MEQ tablet Take 1 tablet by mouth daily.  09/26/14  Yes Historical Provider, MD  metoprolol succinate (TOPROL-XL) 25 MG 24 hr tablet Take 12.5 mg by mouth daily.  12/25/14  Yes Historical Provider, MD  NON FORMULARY Place 1 drop into both eyes 2 (two) times daily as needed (hurting, dry). thera- tears   Yes Historical Provider, MD  nystatin (MYCOSTATIN) 100000 UNIT/ML suspension Take 5 mLs by mouth daily as needed. FOR THRUSH 09/28/14  Yes Historical Provider, MD  oxyCODONE-acetaminophen (PERCOCET/ROXICET) 5-325 MG per tablet Take 1 tablet by mouth every 8 (eight) hours as needed for severe pain.    Yes Historical Provider, MD   pantoprazole (PROTONIX) 40 MG tablet TAKE ONE TABLET BY MOUTH TWICE DAILY 01/03/14  Yes Dellia Nims, MD  SYMBICORT 160-4.5 MCG/ACT inhaler INHALE TWO PUFFS BY MOUTH TWICE DAILY 11/06/14  Yes Rigoberto Noel, MD  tiotropium (SPIRIVA) 18 MCG inhalation capsule Place 1 capsule (18 mcg total) into inhaler and inhale daily. 08/07/14  Yes Rigoberto Noel, MD  aspirin EC 81 MG EC tablet Take 1 tablet (81 mg total) by mouth daily. Patient not taking: Reported on 02/04/2015 02/01/15   Velvet Bathe, MD  azithromycin (ZITHROMAX) 250 MG tablet Take 1 tablet (250 mg total) by mouth daily. Take first 2 tablets together, then 1 every day until finished. 02/04/15   Ozella Almond Ward, PA-C  predniSONE (DELTASONE) 20 MG tablet Take 2 tablets (40 mg total) by mouth daily. 02/04/15   York Cerise  Pilcher Ward, PA-C   BP 119/104 mmHg  Pulse 112  Temp(Src) 97.5 F (36.4 C) (Oral)  Resp 27  SpO2 98% Physical Exam  Constitutional: She is oriented to person, place, and time. She appears well-developed and well-nourished.  Alert and in no acute distress  HENT:  Head: Normocephalic and atraumatic.  Cardiovascular: Intact distal pulses.   Tachy: 105-115; irregularly irregular. No mrg  Pulmonary/Chest: Effort normal. No respiratory distress. She exhibits no tenderness.  + expiratory wheezing in all lung fields.  Abdominal: She exhibits no mass. There is no rebound and no guarding.  Abdomen soft, non-tender, non-distended Bowel sounds positive in all four quadrants  Musculoskeletal: She exhibits no edema.  Neurological: She is alert and oriented to person, place, and time.  Skin: Skin is warm and dry. No rash noted.  Psychiatric: She has a normal mood and affect. Her behavior is normal. Judgment and thought content normal.  Nursing note and vitals reviewed.   ED Course  Procedures (including critical care time) Labs Review Labs Reviewed  CBC - Abnormal; Notable for the following:    RBC 3.81 (*)    Hemoglobin 11.1  (*)    All other components within normal limits  BASIC METABOLIC PANEL - Abnormal; Notable for the following:    Glucose, Bld 137 (*)    Calcium 8.8 (*)    All other components within normal limits  BRAIN NATRIURETIC PEPTIDE - Abnormal; Notable for the following:    B Natriuretic Peptide 217.6 (*)    All other components within normal limits  I-STAT TROPOININ, ED    Imaging Review Dg Chest 2 View  02/04/2015  CLINICAL DATA:  Pt here with c/o SOB, nausea since yesterday. Hx chronic SOB, COPD, pna recently, CHF, a-fib, HTN, ex-smoker EXAM: CHEST - 2 VIEW COMPARISON:  01/30/2015 FINDINGS: Progressive patchy airspace disease in the lingula. Mild pulmonary vascular congestion and diffuse interstitial prominence, slightly increased from previous. Heart size upper limits normal. Tortuous atheromatous aorta. No effusion.  No pneumothorax. Visualized skeletal structures are unremarkable. IMPRESSION: 1. Some increase in bilateral interstitial edema or infiltrate with progressive lingular airspace disease suggesting pneumonia. Electronically Signed   By: Lucrezia Europe M.D.   On: 02/04/2015 11:31   I have personally reviewed and evaluated these images and lab results as part of my medical decision-making.   EKG Interpretation None      MDM   Final diagnoses:  Community acquired pneumonia  Chronic atrial fibrillation ([place])   [name] presents with shortness of breath after recent admission for PNA - discharged on 12/23 Received one neb tx via EMS pta and two tx here in ED.  Labs: CBC, BMP, troponin, BNP Imaging: CXR slightly more progressive than prior CXR  A&P: Pneumonia  - Pt. Does not want to be admitted to hospital. Would like to be treated as outpatient if possible  - Patient started on azithro and prednisone  - Strict return precautions given, stressed importance of PCP follow up.   - Will discharge to home in good and stable condition.   Patient seen by and discussed with Dr.  Lacinda Axon who agrees with treatment plan.   [facility] Ward, PA-C 02/04/15 [place], MD 02/06/15 [phone]

## 2015-02-05 ENCOUNTER — Other Ambulatory Visit: Payer: Self-pay | Admitting: Pulmonary Disease

## 2015-02-05 ENCOUNTER — Other Ambulatory Visit: Payer: Self-pay | Admitting: *Deleted

## 2015-02-05 NOTE — Patient Outreach (Addendum)
[place] [facility]) Care Management  02/05/2015  Kelsey Garcia August 14, 1945 [id number]   Transition of care (week 1)  RN attempted to reach pt today due to her recent discharge however pt not available. RN able to leave a HIPAA approved voice message requesting a call back to inquire further. Will awaiting call back or follow up accordingly.  [name], RN Care Management Coordinator [place] [facility] Main Office [phone]

## 2015-02-06 ENCOUNTER — Other Ambulatory Visit: Payer: Self-pay | Admitting: Family Medicine

## 2015-02-06 NOTE — Telephone Encounter (Signed)
On discharge from [facility] patient was started on Diltiazem 120 mg by mouth every 8 hours. Patient has follow-up appointment with Lyda Jester PA 02/14/15.

## 2015-02-07 ENCOUNTER — Other Ambulatory Visit: Payer: Self-pay | Admitting: *Deleted

## 2015-02-07 NOTE — Patient Outreach (Signed)
[place] [facility]) Care Management  02/07/2015  QUANETTA TRUSS 01-Dec-1945 [id number]   Transition of care (week 1)  RN spoke with pt today concerning her recent discharge from the hospital and explained the [facility] services and purpose for today's call.  Pt very anxious in explaining her history of past events. RN completed the transition of care template and inquired further on her ongoing COPD. Pt states she uses her rescue inhaler every morning and her doctor is aware of how frequently she uses this inhaler. Pt reported all of her upcoming medical appointments with her pulmonologist, cardiologist (1/5) and primary doctor (1/6). Pt states she is taking all her medications as prescribed with no problems mentioned. Pt states she was having issues with her [employer] transportation. RN inquired if pt has a case worker due to her MCD status as pt states her has a Product/process development scientist who is very busy. RN also discussed SCATs as pt aware of this services and has a history. RN encourage pt to use the SCATS service if she is having delays with the M[employer] transportation services. After all inquired RN explained case management services related to home visits as pt states she is "very familiar" with COPD and confirms she has the COPD packet and aware of the COPD action plan. Pt initially receptive to home visits as RN further discussed a plan of care and goals would be involved as we attempt to prevent COPD symptoms occuring. After extending an invitation to pt for [facility] home visits pt's statement was "this is not going to work" and ended the call.  RN attempted to contact pt to confirm her understanding of [facility] case management services and offer ongoing transition of care calls or possible health coaching however only able to leave a message requesting a call back.   [name], RN Care Management Coordinator [place] [facility] Main Office [phone]

## 2015-02-10 DIAGNOSIS — C3490 Malignant neoplasm of unspecified part of unspecified bronchus or lung: Secondary | ICD-10-CM

## 2015-02-10 HISTORY — DX: Malignant neoplasm of unspecified part of unspecified bronchus or lung: C34.90

## 2015-02-12 ENCOUNTER — Other Ambulatory Visit: Payer: Self-pay | Admitting: *Deleted

## 2015-02-12 NOTE — Patient Outreach (Signed)
[place] [facility]) Care Management  02/12/2015  Kelsey Garcia 09-15-1945 [id number]  Transition of care (week 2)  RN attempted to follow up with this pt however unsuccessful. Will continue attempts for [facility] services accordingly.  [name], RN Care Management Coordinator [place] Office [phone]

## 2015-02-13 ENCOUNTER — Encounter: Payer: Self-pay | Admitting: *Deleted

## 2015-02-14 ENCOUNTER — Encounter: Payer: Self-pay | Admitting: Cardiology

## 2015-02-14 ENCOUNTER — Ambulatory Visit (INDEPENDENT_AMBULATORY_CARE_PROVIDER_SITE_OTHER): Payer: Medicare Other | Admitting: Cardiology

## 2015-02-14 VITALS — BP 112/66 | HR 92 | Ht 65.0 in | Wt 186.0 lb

## 2015-02-14 DIAGNOSIS — I482 Chronic atrial fibrillation, unspecified: Secondary | ICD-10-CM

## 2015-02-14 MED ORDER — METOPROLOL SUCCINATE ER 25 MG PO TB24: 25.0000 mg | ORAL_TABLET | Freq: Every day | ORAL | Status: DC

## 2015-02-14 NOTE — Patient Instructions (Signed)
Medication Instructions:  Your physician has recommended you make the following change in your medication:  1- Increase Metoprolol 25 mg by mouth daily  Labwork: NONE  Testing/Procedures: NONE  Follow-Up: Your physician wants you to follow-up in: May with [name]. You will receive a reminder letter in the mail two months in advance. If you don't receive a letter, please call our office to schedule the follow-up appointment.  If you need a refill on your cardiac medications before your next appointment, please call your pharmacy.
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
## 2015-02-18 ENCOUNTER — Other Ambulatory Visit: Payer: Self-pay | Admitting: *Deleted

## 2015-02-18 ENCOUNTER — Encounter: Payer: Self-pay | Admitting: *Deleted

## 2015-02-18 NOTE — Patient Outreach (Signed)
[facility]) Care Management  02/18/2015  Kelsey Garcia May 12, 1945 [id number]   Transition of care (week 3)  Three attempt over 3 weeks to reach pt once again however unsuccessful. Will send outreach letter accordingly to await response prior to closure of this case.  [name], RN Care Management Coordinator [place] Office [phone]

## 2015-02-23 ENCOUNTER — Emergency Department (HOSPITAL_COMMUNITY): Payer: Medicare Other

## 2015-02-23 ENCOUNTER — Encounter (HOSPITAL_COMMUNITY): Payer: Self-pay

## 2015-02-23 ENCOUNTER — Inpatient Hospital Stay (HOSPITAL_COMMUNITY)
Admission: EM | Admit: 2015-02-23 | Discharge: 2015-03-05 | DRG: 166 | Disposition: A | Discharge status: Home Health | Payer: Medicare Other | Attending: Internal Medicine | Admitting: Internal Medicine

## 2015-02-23 DIAGNOSIS — R0602 Shortness of breath: Secondary | ICD-10-CM | POA: Diagnosis present

## 2015-02-23 DIAGNOSIS — I482 Chronic atrial fibrillation: Secondary | ICD-10-CM | POA: Diagnosis present

## 2015-02-23 DIAGNOSIS — M810 Age-related osteoporosis without current pathological fracture: Secondary | ICD-10-CM | POA: Diagnosis present

## 2015-02-23 DIAGNOSIS — J181 Lobar pneumonia, unspecified organism: Secondary | ICD-10-CM | POA: Diagnosis present

## 2015-02-23 DIAGNOSIS — K219 Gastro-esophageal reflux disease without esophagitis: Secondary | ICD-10-CM | POA: Diagnosis present

## 2015-02-23 DIAGNOSIS — I340 Nonrheumatic mitral (valve) insufficiency: Secondary | ICD-10-CM | POA: Diagnosis present

## 2015-02-23 DIAGNOSIS — I10 Essential (primary) hypertension: Secondary | ICD-10-CM | POA: Diagnosis present

## 2015-02-23 DIAGNOSIS — I480 Paroxysmal atrial fibrillation: Secondary | ICD-10-CM | POA: Diagnosis not present

## 2015-02-23 DIAGNOSIS — I4891 Unspecified atrial fibrillation: Secondary | ICD-10-CM | POA: Diagnosis not present

## 2015-02-23 DIAGNOSIS — E785 Hyperlipidemia, unspecified: Secondary | ICD-10-CM | POA: Diagnosis present

## 2015-02-23 DIAGNOSIS — J189 Pneumonia, unspecified organism: Secondary | ICD-10-CM | POA: Diagnosis not present

## 2015-02-23 DIAGNOSIS — Y95 Nosocomial condition: Secondary | ICD-10-CM | POA: Diagnosis present

## 2015-02-23 DIAGNOSIS — C3410 Malignant neoplasm of upper lobe, unspecified bronchus or lung: Secondary | ICD-10-CM

## 2015-02-23 DIAGNOSIS — R9389 Abnormal findings on diagnostic imaging of other specified body structures: Secondary | ICD-10-CM

## 2015-02-23 DIAGNOSIS — J440 Chronic obstructive pulmonary disease with acute lower respiratory infection: Secondary | ICD-10-CM | POA: Diagnosis present

## 2015-02-23 DIAGNOSIS — Z9981 Dependence on supplemental oxygen: Secondary | ICD-10-CM | POA: Diagnosis not present

## 2015-02-23 DIAGNOSIS — M25559 Pain in unspecified hip: Secondary | ICD-10-CM | POA: Diagnosis not present

## 2015-02-23 DIAGNOSIS — I428 Other cardiomyopathies: Secondary | ICD-10-CM | POA: Diagnosis not present

## 2015-02-23 DIAGNOSIS — J9620 Acute and chronic respiratory failure, unspecified whether with hypoxia or hypercapnia: Secondary | ICD-10-CM | POA: Diagnosis not present

## 2015-02-23 DIAGNOSIS — B370 Candidal stomatitis: Secondary | ICD-10-CM | POA: Diagnosis present

## 2015-02-23 DIAGNOSIS — F418 Other specified anxiety disorders: Secondary | ICD-10-CM | POA: Diagnosis present

## 2015-02-23 DIAGNOSIS — C3490 Malignant neoplasm of unspecified part of unspecified bronchus or lung: Secondary | ICD-10-CM | POA: Diagnosis present

## 2015-02-23 DIAGNOSIS — F429 Obsessive-compulsive disorder, unspecified: Secondary | ICD-10-CM | POA: Diagnosis present

## 2015-02-23 DIAGNOSIS — Z9889 Other specified postprocedural states: Secondary | ICD-10-CM

## 2015-02-23 DIAGNOSIS — Z66 Do not resuscitate: Secondary | ICD-10-CM | POA: Diagnosis present

## 2015-02-23 DIAGNOSIS — J441 Chronic obstructive pulmonary disease with (acute) exacerbation: Secondary | ICD-10-CM | POA: Diagnosis present

## 2015-02-23 DIAGNOSIS — Z8701 Personal history of pneumonia (recurrent): Secondary | ICD-10-CM | POA: Diagnosis not present

## 2015-02-23 DIAGNOSIS — Z87891 Personal history of nicotine dependence: Secondary | ICD-10-CM | POA: Diagnosis not present

## 2015-02-23 DIAGNOSIS — F411 Generalized anxiety disorder: Secondary | ICD-10-CM | POA: Diagnosis not present

## 2015-02-23 DIAGNOSIS — C3492 Malignant neoplasm of unspecified part of left bronchus or lung: Secondary | ICD-10-CM | POA: Diagnosis not present

## 2015-02-23 DIAGNOSIS — I5032 Chronic diastolic (congestive) heart failure: Secondary | ICD-10-CM | POA: Diagnosis present

## 2015-02-23 DIAGNOSIS — Z515 Encounter for palliative care: Secondary | ICD-10-CM | POA: Insufficient documentation

## 2015-02-23 DIAGNOSIS — J9621 Acute and chronic respiratory failure with hypoxia: Secondary | ICD-10-CM | POA: Diagnosis present

## 2015-02-23 HISTORY — DX: Malignant neoplasm of unspecified part of unspecified bronchus or lung: C34.90

## 2015-02-23 LAB — CBC WITH DIFFERENTIAL/PLATELET
Basophils Absolute: 0 10*3/uL (ref 0.0–0.1)
Basophils Relative: 0 %
EOS ABS: 0.2 10*3/uL (ref 0.0–0.7)
EOS PCT: 2 %
HCT: 32.7 % — ABNORMAL LOW (ref 36.0–46.0)
Hemoglobin: 9.6 g/dL — ABNORMAL LOW (ref 12.0–15.0)
LYMPHS ABS: 1.6 10*3/uL (ref 0.7–4.0)
LYMPHS PCT: 23 %
MCH: 28.2 pg (ref 26.0–34.0)
MCHC: 29.4 g/dL — AB (ref 30.0–36.0)
MCV: 96.2 fL (ref 78.0–100.0)
MONOS PCT: 8 %
Monocytes Absolute: 0.5 10*3/uL (ref 0.1–1.0)
Neutro Abs: 4.7 10*3/uL (ref 1.7–7.7)
Neutrophils Relative %: 67 %
PLATELETS: 206 10*3/uL (ref 150–400)
RBC: 3.4 MIL/uL — ABNORMAL LOW (ref 3.87–5.11)
RDW: 15.2 % (ref 11.5–15.5)
WBC: 7 10*3/uL (ref 4.0–10.5)

## 2015-02-23 LAB — BASIC METABOLIC PANEL
Anion gap: 9 (ref 5–15)
BUN: 16 mg/dL (ref 6–20)
CHLORIDE: 104 mmol/L (ref 101–111)
CO2: 32 mmol/L (ref 22–32)
CREATININE: 0.78 mg/dL (ref 0.44–1.00)
Calcium: 8.6 mg/dL — ABNORMAL LOW (ref 8.9–10.3)
GFR calc Af Amer: >60 mL/min (ref >60)
GFR calc non Af Amer: >60 mL/min (ref >60)
GLUCOSE: 108 mg/dL — AB (ref 65–99)
Potassium: 3.7 mmol/L (ref 3.5–5.1)
Sodium: 145 mmol/L (ref 135–145)

## 2015-02-23 LAB — PROTIME-INR
INR: 1.11 (ref 0.00–1.49)
Prothrombin Time: 14.5 seconds (ref 11.6–15.2)

## 2015-02-23 LAB — APTT: APTT: 32 s (ref 24–37)

## 2015-02-23 MED ORDER — METOPROLOL SUCCINATE ER 25 MG PO TB24
25.0000 mg | ORAL_TABLET | Freq: Every day | ORAL | Status: DC
  Administered 2015-02-24 – 2015-03-05 (×9): 25 mg via ORAL
  Filled 2015-02-23 (×10): qty 1

## 2015-02-23 MED ORDER — IPRATROPIUM-ALBUTEROL 0.5-2.5 (3) MG/3ML IN SOLN
3.0000 mL | RESPIRATORY_TRACT | Status: DC
Start: 2015-02-24 — End: 2015-02-24
  Administered 2015-02-24: 3 mL via RESPIRATORY_TRACT
  Filled 2015-02-23: qty 3

## 2015-02-23 MED ORDER — DILTIAZEM HCL 60 MG PO TABS
120.0000 mg | ORAL_TABLET | Freq: Three times a day (TID) | ORAL | Status: DC
  Administered 2015-02-24 – 2015-03-05 (×30): 120 mg via ORAL
  Filled 2015-02-23 (×30): qty 2

## 2015-02-23 MED ORDER — OXYCODONE-ACETAMINOPHEN 5-325 MG PO TABS
1.0000 | ORAL_TABLET | Freq: Three times a day (TID) | ORAL | Status: DC | PRN
  Administered 2015-02-23 – 2015-03-01 (×16): 1 via ORAL
  Filled 2015-02-23 (×16): qty 1

## 2015-02-23 MED ORDER — TIOTROPIUM BROMIDE MONOHYDRATE 18 MCG IN CAPS
18.0000 ug | ORAL_CAPSULE | Freq: Every day | RESPIRATORY_TRACT | Status: DC
  Administered 2015-02-24 – 2015-03-04 (×7): 18 ug via RESPIRATORY_TRACT
  Filled 2015-02-23 (×3): qty 5

## 2015-02-23 MED ORDER — NYSTATIN 100000 UNIT/ML MT SUSP
5.0000 mL | Freq: Every day | OROMUCOSAL | Status: DC | PRN
  Administered 2015-02-26 – 2015-03-02 (×3): 500000 [IU] via ORAL
  Filled 2015-02-23 (×4): qty 5

## 2015-02-23 MED ORDER — HEPARIN SODIUM (PORCINE) 5000 UNIT/ML IJ SOLN
5000.0000 [IU] | Freq: Three times a day (TID) | INTRAMUSCULAR | Status: DC
  Administered 2015-02-23 – 2015-03-05 (×29): 5000 [IU] via SUBCUTANEOUS
  Filled 2015-02-23 (×27): qty 1

## 2015-02-23 MED ORDER — FLUOXETINE HCL 20 MG PO CAPS
20.0000 mg | ORAL_CAPSULE | Freq: Every day | ORAL | Status: DC
  Administered 2015-02-24 – 2015-03-05 (×11): 20 mg via ORAL
  Filled 2015-02-23 (×11): qty 1

## 2015-02-23 MED ORDER — ASPIRIN EC 325 MG PO TBEC
650.0000 mg | DELAYED_RELEASE_TABLET | Freq: Every day | ORAL | Status: DC
  Administered 2015-02-24 – 2015-02-26 (×3): 650 mg via ORAL
  Administered 2015-02-27: 325 mg via ORAL
  Administered 2015-02-28 – 2015-03-05 (×6): 650 mg via ORAL
  Filled 2015-02-23 (×11): qty 2

## 2015-02-23 MED ORDER — METHYLPREDNISOLONE SODIUM SUCC 125 MG IJ SOLR
60.0000 mg | Freq: Two times a day (BID) | INTRAMUSCULAR | Status: DC
Start: 2015-02-23 — End: 2015-03-01
  Administered 2015-02-23 – 2015-02-28 (×11): 60 mg via INTRAVENOUS
  Filled 2015-02-23 (×12): qty 2

## 2015-02-23 MED ORDER — ONDANSETRON HCL 4 MG PO TABS: 4.0000 mg | ORAL_TABLET | Freq: Four times a day (QID) | ORAL | Status: DC | PRN

## 2015-02-23 MED ORDER — ALBUTEROL SULFATE (2.5 MG/3ML) 0.083% IN NEBU
2.5000 mg | INHALATION_SOLUTION | RESPIRATORY_TRACT | Status: DC | PRN
  Administered 2015-02-25 (×2): 2.5 mg via RESPIRATORY_TRACT
  Filled 2015-02-23 (×2): qty 3

## 2015-02-23 MED ORDER — DEXTROSE 5 % IV SOLN
1.0000 g | Freq: Three times a day (TID) | INTRAVENOUS | Status: AC
  Administered 2015-02-24 – 2015-03-02 (×20): 1 g via INTRAVENOUS
  Filled 2015-02-23 (×22): qty 1

## 2015-02-23 MED ORDER — DM-GUAIFENESIN ER 30-600 MG PO TB12
1.0000 | ORAL_TABLET | Freq: Two times a day (BID) | ORAL | Status: DC
  Administered 2015-02-23 – 2015-03-05 (×17): 1 via ORAL
  Filled 2015-02-23 (×19): qty 1

## 2015-02-23 MED ORDER — SODIUM CHLORIDE 0.9 % IJ SOLN
3.0000 mL | Freq: Two times a day (BID) | INTRAMUSCULAR | Status: DC
  Administered 2015-02-23 – 2015-03-05 (×14): 3 mL via INTRAVENOUS

## 2015-02-23 MED ORDER — FUROSEMIDE 40 MG PO TABS
40.0000 mg | ORAL_TABLET | Freq: Every day | ORAL | Status: DC
  Administered 2015-02-24 – 2015-03-05 (×10): 40 mg via ORAL
  Filled 2015-02-23 (×10): qty 1

## 2015-02-23 MED ORDER — ONDANSETRON HCL 4 MG/2ML IJ SOLN
4.0000 mg | Freq: Four times a day (QID) | INTRAMUSCULAR | Status: DC | PRN
  Administered 2015-02-23: 4 mg via INTRAVENOUS
  Filled 2015-02-23: qty 2

## 2015-02-23 MED ORDER — VANCOMYCIN HCL IN DEXTROSE 1-5 GM/200ML-% IV SOLN
1000.0000 mg | Freq: Once | INTRAVENOUS | Status: AC
  Administered 2015-02-23: 1000 mg via INTRAVENOUS
  Filled 2015-02-23: qty 200

## 2015-02-23 MED ORDER — PANTOPRAZOLE SODIUM 40 MG PO TBEC
40.0000 mg | DELAYED_RELEASE_TABLET | Freq: Every day | ORAL | Status: DC
  Administered 2015-02-24 – 2015-03-05 (×9): 40 mg via ORAL
  Filled 2015-02-23 (×10): qty 1

## 2015-02-23 MED ORDER — HYDROCODONE-HOMATROPINE 5-1.5 MG/5ML PO SYRP
2.5000 mL | ORAL_SOLUTION | Freq: Two times a day (BID) | ORAL | Status: DC | PRN
  Administered 2015-02-24 – 2015-03-04 (×11): 2.5 mL via ORAL
  Filled 2015-02-23 (×11): qty 5

## 2015-02-23 MED ORDER — BUSPIRONE HCL 5 MG PO TABS
10.0000 mg | ORAL_TABLET | Freq: Three times a day (TID) | ORAL | Status: DC
  Administered 2015-02-24 – 2015-03-05 (×29): 10 mg via ORAL
  Filled 2015-02-23 (×3): qty 2
  Filled 2015-02-23: qty 1
  Filled 2015-02-23 (×6): qty 2
  Filled 2015-02-23: qty 1
  Filled 2015-02-23: qty 2
  Filled 2015-02-23: qty 1
  Filled 2015-02-23 (×2): qty 2
  Filled 2015-02-23 (×2): qty 1
  Filled 2015-02-23 (×2): qty 2
  Filled 2015-02-23 (×2): qty 1
  Filled 2015-02-23: qty 2
  Filled 2015-02-23: qty 1
  Filled 2015-02-23 (×2): qty 2
  Filled 2015-02-23: qty 1
  Filled 2015-02-23: qty 2
  Filled 2015-02-23: qty 1
  Filled 2015-02-23 (×2): qty 2
  Filled 2015-02-23 (×2): qty 1

## 2015-02-23 NOTE — Progress Notes (Signed)
ANTIBIOTIC CONSULT NOTE - INITIAL  Pharmacy Consult for Cefepime/Vancomycin Indication: HCAP  Allergies  Allergen Reactions  . Ace Inhibitors Cough  . Pseudoeph-Doxylamine-Dm-Apap Itching  . Diphenhydramine Hcl Other (See Comments)    "feels like my skin is crawling, and legs twitches"  . Erythromycin Diarrhea  . Nsaids Nausea And Vomiting  . Nyquil [Pseudoeph-Doxylamine-Dm-Apap] Itching  . Tramadol Hcl Other (See Comments)    stomach pain, hallucinations  . Codeine Swelling    Face and lips swelling  Patient report Can tolerate hydrocodone  . Fosamax [Alendronate] Other (See Comments)    Nausea and abdominal bloating.   . Tomato Other (See Comments)    Stomach swells  . Orange Fruit [Citrus] Rash  . Pseudoephedrine Palpitations    Patient Measurements:   Wt=84 kg  Vital Signs: BP: 125/68 mmHg (01/14 2230) Pulse Rate: 91 (01/14 2230) Intake/Output from previous day:   Intake/Output from this shift:    Labs:  Recent Labs  02/23/15 1922  WBC 7.0  HGB 9.6*  PLT 206  CREATININE 0.78   Estimated Creatinine Clearance: 72.3 mL/min (by C-G formula based on Cr of 0.78). No results for input(s): VANCOTROUGH, VANCOPEAK, VANCORANDOM, GENTTROUGH, GENTPEAK, GENTRANDOM, TOBRATROUGH, TOBRAPEAK, TOBRARND, AMIKACINPEAK, AMIKACINTROU, AMIKACIN in the last 72 hours.   Microbiology: Recent Results (from the past 720 hour(s))  Culture, blood (Routine X 2) w Reflex to ID Panel     Status: None   Collection Time: 01/30/15  6:25 AM  Result Value Ref Range Status   Specimen Description BLOOD RIGHT ARM  Final   Special Requests BOTTLES DRAWN AEROBIC AND ANAEROBIC 5CC  Final   Culture   Final    NO GROWTH 5 DAYS Performed at [facility]    Report Status 02/04/2015 FINAL  Final  Culture, blood (Routine X 2) w Reflex to ID Panel     Status: None   Collection Time: 01/30/15  7:02 AM  Result Value Ref Range Status   Specimen Description BLOOD LEFT ARM  Final   Special  Requests BOTTLES DRAWN AEROBIC AND ANAEROBIC 5ML  Final   Culture   Final    NO GROWTH 5 DAYS Performed at [facility]    Report Status 02/04/2015 FINAL  Final  Urine culture     Status: None   Collection Time: 01/30/15  8:01 AM  Result Value Ref Range Status   Specimen Description URINE, CLEAN CATCH  Final   Special Requests NONE  Final   Culture   Final    MULTIPLE SPECIES PRESENT, SUGGEST RECOLLECTION Performed at [facility]    Report Status 01/31/2015 FINAL  Final  Culture, sputum-assessment     Status: None   Collection Time: 01/31/15  9:42 AM  Result Value Ref Range Status   Specimen Description SPUTUM  Final   Special Requests Immunocompromised  Final   Sputum evaluation   Final    THIS SPECIMEN IS ACCEPTABLE. RESPIRATORY CULTURE REPORT TO FOLLOW.   Report Status 01/31/2015 FINAL  Final  Culture, respiratory (NON-Expectorated)     Status: None   Collection Time: 01/31/15  9:42 AM  Result Value Ref Range Status   Specimen Description SPUTUM  Final   Special Requests NONE  Final   Gram Stain   Final    FEW WBC PRESENT, PREDOMINANTLY PMN FEW SQUAMOUS EPITHELIAL CELLS PRESENT RARE GRAM POSITIVE RODS RARE GRAM POSITIVE COCCI IN PAIRS    Culture   Final    NORMAL OROPHARYNGEAL FLORA Performed at [facility]    Report Status 02/03/2015 FINAL  Final    Medical History: Past Medical History  Diagnosis Date  . Atrial fibrillation ([place])   . Hypertension   . Insomnia   . Nonischemic cardiomyopathy ([place])   . Hyperlipidemia   . Anxiety   . Cholelithiasis   . Insomnia   . Mitral regurgitation     noted 2010  . H/O epistaxis   . Rhinitis, allergic   . CHF (congestive heart failure) ([place])   . Pneumonia     "several times w/exacerbations of the COPD; nothing in the last year" (07/15/2012)  . COPD (chronic obstructive pulmonary disease) ([place])     as of 7/13 on 2-3L, pfts 10/2008 with mod obstruction  . Chronic bronchitis with COPD (chronic  obstructive pulmonary disease) ([place])   . Shortness of breath     "all the time right now" (07/15/2012)  . GERD (gastroesophageal reflux disease)   . JSEGBTDV(761.6)     "weekly" (07/15/2012)  . Migraines     "weekly for awhile; cleared up as I got older" (07/15/2012)  . DJD (degenerative joint disease)   . Arthritis     "all over" (07/15/2012)  . OCD (obsessive compulsive disorder)   . OCD (obsessive compulsive disorder)   . Depression     h/o SI; "last time I was really serious about it was ~ 1997" (07/15/2012)  . Thoracic vertebral fracture ([place]) 11/03/2014    Medications:   (Not in a hospital admission) Scheduled:  . dextromethorphan-guaiFENesin  1 tablet Oral BID  . [START ON 02/24/2015] ipratropium-albuterol  3 mL Nebulization Q4H  . methylPREDNISolone (SOLU-MEDROL) injection  60 mg Intravenous Q12H   Infusions:  . ceFEPime (MAXIPIME) IV    . vancomycin     Assessment: 71 yoF with hx COPD on oxygen at home c/o SOB x 3 days.   Goal of Therapy:  Vancomycin trough level 15-20 mcg/ml  Plan:   Cefepime 1Gm IV q8h  Vancomycin 1Gm q12h  F/u Scr/cultures/levels  Dorrene German 02/23/2015,11:11 PM

## 2015-02-23 NOTE — ED Provider Notes (Signed)
CSN: [id number]     Arrival date & time 02/23/15  1831 History   First MD Initiated Contact with Patient 02/23/15 1839     Chief Complaint  Patient presents with  . Shortness of Breath     (Consider location/radiation/quality/duration/timing/severity/associated sxs/prior Treatment) HPI Comments: Patient with a history of atrial fibrillation, CHF, hypertension and COPD presents with shortness of breath and wheezing. She is oxygen dependent and uses 2 L/m of oxygen at home all the time. She states over last 34 days she's had worsening wheezing and shortness of breath. She's been using her nebulizer machines at home without improvement of symptoms. She has an ongoing cough which is typically productive of white sputum. She's had intermittent fevers with the last fever being 2 days ago. She denies any vomiting. She did have a loose stool yesterday. She recently was admitted for pneumonia on December 23. She was seen again in emergency department on December 26 with a worsening chest x-ray. She did not want to be admitted at that point and was discharged with Zithromax and prednisone. She previously completed a course of doxycycline from her admission for pneumonia. She denies any leg swelling. He denies any chest pain.  Patient is a 70 y.o. female presenting with shortness of breath.  Shortness of Breath Associated symptoms: fever and wheezing   Associated symptoms: no abdominal pain, no chest pain, no cough, no diaphoresis, no headaches, no rash and no vomiting     Past Medical History  Diagnosis Date  . Atrial fibrillation ([place])   . Hypertension   . Insomnia   . Nonischemic cardiomyopathy ([place])   . Hyperlipidemia   . Anxiety   . Cholelithiasis   . Insomnia   . Mitral regurgitation     noted 2010  . H/O epistaxis   . Rhinitis, allergic   . CHF (congestive heart failure) ([place])   . Pneumonia     "several times w/exacerbations of the COPD; nothing in the last year" (07/15/2012)  . COPD  (chronic obstructive pulmonary disease) ([place])     as of 7/13 on 2-3L, pfts 10/2008 with mod obstruction  . Chronic bronchitis with COPD (chronic obstructive pulmonary disease) ([place])   . Shortness of breath     "all the time right now" (07/15/2012)  . GERD (gastroesophageal reflux disease)   . VOZDGUYQ(034.7)     "weekly" (07/15/2012)  . Migraines     "weekly for awhile; cleared up as I got older" (07/15/2012)  . DJD (degenerative joint disease)   . Arthritis     "all over" (07/15/2012)  . OCD (obsessive compulsive disorder)   . OCD (obsessive compulsive disorder)   . Depression     h/o SI; "last time I was really serious about it was ~ 1997" (07/15/2012)  . Thoracic vertebral fracture ([place]) 11/03/2014   Past Surgical History  Procedure Laterality Date  . Tubal ligation  1972  . Cardioversion  2003; 07/2003   Family History  Problem Relation Age of Onset  . Asthma    . Emphysema    . Allergies    . Cancer      aunt had several types of cancer  . COPD Mother   . Emphysema Mother   . Cirrhosis Father    Social History  Substance Use Topics  . Smoking status: Former Smoker -- 1.00 packs/day for 35 years    Types: Cigarettes    Quit date: 06/18/2014  . Smokeless tobacco: Never Used  . Alcohol Use: No  OB History    No data available     Review of Systems  Constitutional: Positive for fever and fatigue. Negative for chills and diaphoresis.  HENT: Negative for congestion, rhinorrhea and sneezing.   Eyes: Negative.   Respiratory: Positive for shortness of breath and wheezing. Negative for cough and chest tightness.   Cardiovascular: Negative for chest pain and leg swelling.  Gastrointestinal: Positive for diarrhea. Negative for nausea, vomiting, abdominal pain and blood in stool.  Genitourinary: Negative for frequency, hematuria, flank pain and difficulty urinating.  Musculoskeletal: Negative for back pain and arthralgias.  Skin: Negative for rash.  Neurological: Negative for  dizziness, speech difficulty, weakness, numbness and headaches.      Allergies  Ace inhibitors; Pseudoeph-doxylamine-dm-apap; Diphenhydramine hcl; Erythromycin; Nsaids; Nyquil; Tramadol hcl; Codeine; Fosamax; Tomato; Orange fruit; and Pseudoephedrine  Home Medications   Prior to Admission medications   Medication Sig Start Date End Date Taking? Authorizing Provider  albuterol (PROVENTIL HFA;VENTOLIN HFA) 108 (90 BASE) MCG/ACT inhaler Inhale 1-2 puffs into the lungs every 6 (six) hours as needed for wheezing. 10/19/14   Rigoberto Noel, MD  albuterol (PROVENTIL) (2.5 MG/3ML) 0.083% nebulizer solution Take 3 mLs (2.5 mg total) by nebulization every 6 (six) hours as needed for wheezing. 09/21/14   Maryann Mikhail, DO  aspirin 325 MG EC tablet Take 650 mg by mouth daily.    Historical Provider, MD  busPIRone (BUSPAR) 10 MG tablet Take 10 mg by mouth 3 (three) times daily. 04/22/14   Historical Provider, MD  diltiazem (CARDIZEM) 120 MG tablet Take 1 tablet (120 mg total) by mouth every 8 (eight) hours. 02/01/15   Velvet Bathe, MD  doxycycline (VIBRAMYCIN) 100 MG capsule Take 1 capsule (100 mg total) by mouth 2 (two) times daily. 01/21/15   Hanna Patel-Mills, PA-C  FLUoxetine (PROZAC) 20 MG capsule TAKE THREE CAPSULES BY MOUTH ONCE DAILY 01/03/14   Dellia Nims, MD  furosemide (LASIX) 20 MG tablet Take 40 mg by mouth daily. She sometimes takes up to 5 tabs a day    Historical Provider, MD  guaiFENesin (MUCINEX) 600 MG 12 hr tablet Take 600 mg by mouth 2 (two) times daily as needed for cough.    Historical Provider, MD  HYDROcodone-homatropine (HYCODAN) 5-1.5 MG/5ML syrup Take 2.5 mLs by mouth 2 (two) times daily as needed for cough.    Historical Provider, MD  ipratropium-albuterol (DUONEB) 0.5-2.5 (3) MG/3ML SOLN Take 3 mLs by nebulization 3 (three) times daily. 09/21/14   Maryann Mikhail, DO  KLOR-CON M20 20 MEQ tablet Take 1 tablet by mouth daily. She will take up to 5 tabs a day 09/26/14   Historical  Provider, MD  metoprolol succinate (TOPROL-XL) 25 MG 24 hr tablet Take 1 tablet (25 mg total) by mouth daily. 02/14/15   Brittainy Erie Noe, PA-C  NON FORMULARY Place 1 drop into both eyes 2 (two) times daily as needed (hurting, dry). thera- tears    Historical Provider, MD  nystatin (MYCOSTATIN) 100000 UNIT/ML suspension Take 5 mLs by mouth daily as needed. FOR THRUSH 09/28/14   Historical Provider, MD  oxyCODONE-acetaminophen (PERCOCET/ROXICET) 5-325 MG per tablet Take 1 tablet by mouth every 8 (eight) hours as needed for severe pain.     Historical Provider, MD  pantoprazole (PROTONIX) 40 MG tablet TAKE ONE TABLET BY MOUTH TWICE DAILY 01/03/14   Dellia Nims, MD  SYMBICORT 160-4.5 MCG/ACT inhaler INHALE TWO PUFFS BY MOUTH TWICE DAILY 02/06/15   Rigoberto Noel, MD  tiotropium (SPIRIVA) 18 MCG inhalation  capsule Place 1 capsule (18 mcg total) into inhaler and inhale daily. 08/07/14   Rigoberto Noel, MD   BP 125/68 mmHg  Pulse 91  Resp 21  SpO2 98% Physical Exam  Constitutional: She is oriented to person, place, and time. She appears well-developed and well-nourished.  HENT:  Head: Normocephalic and atraumatic.  Eyes: Pupils are equal, round, and reactive to light.  Neck: Normal range of motion. Neck supple.  Cardiovascular: Normal rate, regular rhythm and normal heart sounds.   Pulmonary/Chest: Effort normal. No respiratory distress. She has wheezes. She has no rales. She exhibits no tenderness.  Patient is talking in short sentences. She has diffuse wheezing in all lung fields. She has mild accessory muscle use.  Abdominal: Soft. Bowel sounds are normal. There is no tenderness. There is no rebound and no guarding.  Musculoskeletal: Normal range of motion. She exhibits no edema.  Lymphadenopathy:    She has no cervical adenopathy.  Neurological: She is alert and oriented to person, place, and time.  Skin: Skin is warm and dry. No rash noted.  Psychiatric: She has a normal mood and affect.     ED Course  Procedures (including critical care time) Labs Review Labs Reviewed  BASIC METABOLIC PANEL - Abnormal; Notable for the following:    Glucose, Bld 108 (*)    Calcium 8.6 (*)    All other components within normal limits  CBC WITH DIFFERENTIAL/PLATELET - Abnormal; Notable for the following:    RBC 3.40 (*)    Hemoglobin 9.6 (*)    HCT 32.7 (*)    MCHC 29.4 (*)    All other components within normal limits    Imaging Review Dg Chest 2 View  02/23/2015  CLINICAL DATA:  Shortness of breath and cough EXAM: CHEST  2 VIEW COMPARISON:  Chest radiograph February 04, 2015; chest CT January 30, 2015 FINDINGS: There is persistent airspace consolidation in the superior lingular region. No new opacity is evident. Heart size and pulmonary vascularity are normal. No adenopathy. No bone lesions. IMPRESSION: Persistent infiltrate in the superior lingula. No new opacity. This finding in the lingula warrants additional imaging surveillance in approximately 10-14 days. If opacity persists in this area at that time, it would be reasonable to consider bronchoscopy to further assess this region, in particular to assess for possible underlying neoplastic focus or localized endobronchial lesion. Electronically Signed   By: Lowella Grip III M.D.   On: 02/23/2015 19:24   I have personally reviewed and evaluated these images and lab results as part of my medical decision-making.   EKG Interpretation   Date/Time:  Saturday February 23 2015 18:57:27 EST Ventricular Rate:  77 PR Interval:    QRS Duration: 95 QT Interval:  412 QTC Calculation: 466 R Axis:   106 Text Interpretation:  Atrial fibrillation Right axis deviation Low  voltage, extremity and precordial leads since last tracing no significant  change Confirmed by Jalexia Lalli  MD, Natelie Ostrosky ([id number]) on 02/23/2015 7:04:36 PM      MDM   Final diagnoses:  HCAP (healthcare-associated pneumonia)  Chronic obstructive pulmonary disease with acute  exacerbation ([place])    Patient presents with cough and wheezing. She received 2 nebulizer treatments and has improvement of symptoms. She is currently maintaining her oxygen saturations on her baseline 2 L/m. She still has some mild wheezing on exam but is talking in full sentences without accessory muscle use. Her chest x-ray shows a persistent lingular infiltrate. This infiltrate is been present since December  12. She's completed 3 courses of doxycycline as well as a course of Zithromax. I consulted with Dr. Oletta Darter with pulmonology who recommended admission for IV antibiotics. Pulmonary will consult on the patient in the morning. I will consult the hospitalist for admission. She was started on cefepime and day for possible healthcare associated pneumonia.  I spoke with Dr. Blaine Hamper who will admit pt to telemetry    Malvin Johns, MD 02/23/15 2311

## 2015-02-23 NOTE — ED Notes (Signed)
Per EMS, pt from home.  Pt c/o shortness of breath x 3 days.  Pt taking home treatments without getting better.  Cough with no fever.  Hx of COPD. Wears 2 L per [place] at home.  Vitals: 110/78, hr 66, 99% Neb, resp 20, 20g Lt hand, 5/5 albuterol/atrovent, solumedrol '125mg'$  in route.

## 2015-02-23 NOTE — H&P (Signed)
[facility] History and Physical  Kelsey Garcia KNL:[id number] DOB: Jun 28, 1945 DOA: 02/23/2015  Referring physician: ED physician PCP: Darden Amber, PA  Specialists:   Chief Complaint: Productive cough, shortness of breath and wheezing  HPI: Kelsey Garcia is a 70 y.o. female with PMH of COPD on 2 L oxygen at home, atrial fibrillation (patient refused anticoagulant), hyperlipidemia, GERD, depression, anxiety, mitral regurgitation, diastolic congestive heart failure, DJD, OCD, who presents with productive cough, shortness of breath and wheezing.  Patient was recently hospitalized from 12/21-12/23/16 due to pneumonia. Per notes, patient refused Levaquin, and was treated with doxycycline. She was discharged at the stable condition. Patient reports that she just completed doxycycline, but continued to have shortness of breath and cough. She coughs up yellow colored sputum. She also has wheezing, but no chest pain, fever or chills. She reports that she had 4 bowel movement with loose stool yesterday, but no diarrhea today. No abdominal pain, symptoms of UTI, unilateral weakness. Of note, patient had CT angiogram on 01/21/15, which did not pulmonary embolism.  In ED, patient was found to have WBC 7.0, tachypnea, no tachycardia, electrolytes and renal function okay. Chest x-ray showed persistent infiltration in superal lingular lobe. Patient is admitted to inpatient for further interventional treatment. [facility] and was consulted by EDP  EKG: Independently reviewed. QTC 466, atrial fibrillation, low voltage.  Where does patient live?   At home    Can patient participate in ADLs?  Yes   Review of Systems:   General: no fevers, chills, no changes in body weight, has poor appetite, has fatigue HEENT: no blurry vision, hearing changes or sore throat Pulm: has dyspnea, coughing, wheezing CV: no chest pain, palpitations Abd: no nausea, vomiting, abdominal pain, had diarrhea, no constipation GU: no  dysuria, burning on urination, increased urinary frequency, hematuria  Ext: no leg edema Neuro: no unilateral weakness, numbness, or tingling, no vision change or hearing loss Skin: no rash MSK: No muscle spasm, no deformity, no limitation of range of movement in spin Heme: No easy bruising.  Travel history: No recent long distant travel.  Allergy:  Allergies  Allergen Reactions  . Ace Inhibitors Cough  . Pseudoeph-Doxylamine-Dm-Apap Itching  . Diphenhydramine Hcl Other (See Comments)    "feels like my skin is crawling, and legs twitches"  . Erythromycin Diarrhea  . Nsaids Nausea And Vomiting  . Nyquil [Pseudoeph-Doxylamine-Dm-Apap] Itching  . Tramadol Hcl Other (See Comments)    stomach pain, hallucinations  . Codeine Swelling    Face and lips swelling  Patient report Can tolerate hydrocodone  . Fosamax [Alendronate] Other (See Comments)    Nausea and abdominal bloating.   . Tomato Other (See Comments)    Stomach swells  . Orange Fruit [Citrus] Rash  . Pseudoephedrine Palpitations    Past Medical History  Diagnosis Date  . Atrial fibrillation ([place])   . Hypertension   . Insomnia   . Nonischemic cardiomyopathy ([place])   . Hyperlipidemia   . Anxiety   . Cholelithiasis   . Insomnia   . Mitral regurgitation     noted 2010  . H/O epistaxis   . Rhinitis, allergic   . CHF (congestive heart failure) ([place])   . Pneumonia     "several times w/exacerbations of the COPD; nothing in the last year" (07/15/2012)  . COPD (chronic obstructive pulmonary disease) ([place])     as of 7/13 on 2-3L, pfts 10/2008 with mod obstruction  . Chronic bronchitis with COPD (chronic obstructive pulmonary disease) ([place])   .  Shortness of breath     "all the time right now" (07/15/2012)  . GERD (gastroesophageal reflux disease)   . YIRSWNIO(270.3)     "weekly" (07/15/2012)  . Migraines     "weekly for awhile; cleared up as I got older" (07/15/2012)  . DJD (degenerative joint disease)   . Arthritis     "all  over" (07/15/2012)  . OCD (obsessive compulsive disorder)   . OCD (obsessive compulsive disorder)   . Depression     h/o SI; "last time I was really serious about it was ~ 1997" (07/15/2012)  . Thoracic vertebral fracture ([place]) 11/03/2014    Past Surgical History  Procedure Laterality Date  . Tubal ligation  1972  . Cardioversion  2003; 07/2003    Social History:  reports that she quit smoking about 8 months ago. Her smoking use included Cigarettes. She has a 35 pack-year smoking history. She has never used smokeless tobacco. She reports that she does not drink alcohol or use illicit drugs.  Family History:  Family History  Problem Relation Age of Onset  . Asthma    . Emphysema    . Allergies    . Cancer      aunt had several types of cancer  . COPD Mother   . Emphysema Mother   . Cirrhosis Father      Prior to Admission medications   Medication Sig Start Date End Date Taking? Authorizing Provider  albuterol (PROVENTIL HFA;VENTOLIN HFA) 108 (90 BASE) MCG/ACT inhaler Inhale 1-2 puffs into the lungs every 6 (six) hours as needed for wheezing. 10/19/14   Rigoberto Noel, MD  albuterol (PROVENTIL) (2.5 MG/3ML) 0.083% nebulizer solution Take 3 mLs (2.5 mg total) by nebulization every 6 (six) hours as needed for wheezing. 09/21/14   Maryann Mikhail, DO  aspirin 325 MG EC tablet Take 650 mg by mouth daily.    Historical Provider, MD  busPIRone (BUSPAR) 10 MG tablet Take 10 mg by mouth 3 (three) times daily. 04/22/14   Historical Provider, MD  diltiazem (CARDIZEM) 120 MG tablet Take 1 tablet (120 mg total) by mouth every 8 (eight) hours. 02/01/15   Velvet Bathe, MD  doxycycline (VIBRAMYCIN) 100 MG capsule Take 1 capsule (100 mg total) by mouth 2 (two) times daily. 01/21/15   Hanna Patel-Mills, PA-C  FLUoxetine (PROZAC) 20 MG capsule TAKE THREE CAPSULES BY MOUTH ONCE DAILY 01/03/14   Dellia Nims, MD  furosemide (LASIX) 20 MG tablet Take 40 mg by mouth daily. She sometimes takes up to 5 tabs a day     Historical Provider, MD  guaiFENesin (MUCINEX) 600 MG 12 hr tablet Take 600 mg by mouth 2 (two) times daily as needed for cough.    Historical Provider, MD  HYDROcodone-homatropine (HYCODAN) 5-1.5 MG/5ML syrup Take 2.5 mLs by mouth 2 (two) times daily as needed for cough.    Historical Provider, MD  ipratropium-albuterol (DUONEB) 0.5-2.5 (3) MG/3ML SOLN Take 3 mLs by nebulization 3 (three) times daily. 09/21/14   Maryann Mikhail, DO  KLOR-CON M20 20 MEQ tablet Take 1 tablet by mouth daily. She will take up to 5 tabs a day 09/26/14   Historical Provider, MD  metoprolol succinate (TOPROL-XL) 25 MG 24 hr tablet Take 1 tablet (25 mg total) by mouth daily. 02/14/15   Brittainy Erie Noe, PA-C  NON FORMULARY Place 1 drop into both eyes 2 (two) times daily as needed (hurting, dry). thera- tears    Historical Provider, MD  nystatin (MYCOSTATIN) 100000 UNIT/ML suspension  Take 5 mLs by mouth daily as needed. FOR THRUSH 09/28/14   Historical Provider, MD  oxyCODONE-acetaminophen (PERCOCET/ROXICET) 5-325 MG per tablet Take 1 tablet by mouth every 8 (eight) hours as needed for severe pain.     Historical Provider, MD  pantoprazole (PROTONIX) 40 MG tablet TAKE ONE TABLET BY MOUTH TWICE DAILY 01/03/14   Dellia Nims, MD  SYMBICORT 160-4.5 MCG/ACT inhaler INHALE TWO PUFFS BY MOUTH TWICE DAILY 02/06/15   Rigoberto Noel, MD  tiotropium (SPIRIVA) 18 MCG inhalation capsule Place 1 capsule (18 mcg total) into inhaler and inhale daily. 08/07/14   Rigoberto Noel, MD    Physical Exam: Filed Vitals:   02/23/15 2230 02/23/15 2300 02/24/15 0015 02/24/15 0019  BP: 125/68 125/67  117/71  Pulse: 91 93  102  Temp:    98.3 F (36.8 C)  TempSrc:    Oral  Resp: 21 40  26  Height:    '5\' 5"'$  (1.651 m)  Weight:    83.4 kg (183 lb 13.8 oz)  SpO2: 98% 98% 94% 94%   General: Not in acute distress HEENT:       Eyes: PERRL, EOMI, no scleral icterus.       ENT: No discharge from the ears and nose, no pharynx injection, no tonsillar  enlargement.        Neck: No JVD, no bruit, no mass felt. Heme: No neck lymph node enlargement. Cardiac: S1/S2, RRR, No murmurs, No gallops or rubs. Pulm: Decreased air movement bilaterally. Has wheezing bilaterally (L>R). No rales or rubs. Abd: Soft, nondistended, nontender, no rebound pain, no organomegaly, BS present. Ext: No pitting leg edema bilaterally. 2+DP/PT pulse bilaterally. Musculoskeletal: No joint deformities, No joint redness or warmth, no limitation of ROM in spin. Has right shoulder pain. Skin: No rashes.  Neuro: Alert, oriented X3, cranial nerves II-XII grossly intact, moves all extremities normally Psych: Patient is not psychotic, no suicidal or hemocidal ideation.  Labs on Admission:  Basic Metabolic Panel:  Recent Labs Lab 02/23/15 1922  NA 145  K 3.7  CL 104  CO2 32  GLUCOSE 108*  BUN 16  CREATININE 0.78  CALCIUM 8.6*   Liver Function Tests: No results for input(s): AST, ALT, ALKPHOS, BILITOT, PROT, ALBUMIN in the last 168 hours. No results for input(s): LIPASE, AMYLASE in the last 168 hours. No results for input(s): AMMONIA in the last 168 hours. CBC:  Recent Labs Lab 02/23/15 1922  WBC 7.0  NEUTROABS 4.7  HGB 9.6*  HCT 32.7*  MCV 96.2  PLT 206   Cardiac Enzymes: No results for input(s): CKTOTAL, CKMB, CKMBINDEX, TROPONINI in the last 168 hours.  BNP (last 3 results)  Recent Labs  11/01/14 1535 01/30/15 0625 02/04/15 1142  BNP 229.4* 181.4* 217.6*    ProBNP (last 3 results) No results for input(s): PROBNP in the last 8760 hours.  CBG: No results for input(s): GLUCAP in the last 168 hours.  Radiological Exams on Admission: Dg Chest 2 View  02/23/2015  CLINICAL DATA:  Shortness of breath and cough EXAM: CHEST  2 VIEW COMPARISON:  Chest radiograph February 04, 2015; chest CT January 30, 2015 FINDINGS: There is persistent airspace consolidation in the superior lingular region. No new opacity is evident. Heart size and pulmonary  vascularity are normal. No adenopathy. No bone lesions. IMPRESSION: Persistent infiltrate in the superior lingula. No new opacity. This finding in the lingula warrants additional imaging surveillance in approximately 10-14 days. If opacity persists in this area at that  time, it would be reasonable to consider bronchoscopy to further assess this region, in particular to assess for possible underlying neoplastic focus or localized endobronchial lesion. Electronically Signed   By: Lowella Grip III M.D.   On: 02/23/2015 19:24    Assessment/Plan Principal Problem:   Acute on chronic respiratory failure (HCC) Active Problems:   HLD (hyperlipidemia)   Depression with anxiety   Essential hypertension   GERD (gastroesophageal reflux disease)   Osteoporosis   COPD exacerbation (HCC)   Atrial fibrillation (HCC)   Chronic diastolic (congestive) heart failure (HCC)   Acute on chronic respiratory failure (HCC) due to COPD exacerbation: CXR has persistent supral lingular infiltration. CTA-chest on 01/21/15 did not have mass or PE. PCCM will see pt in AM.  -will admit patient to telemetry bed  -Nebulizers: scheduled Duoneb and prn albuterol -Solu-Medrol 60 mg IV bid -ED started vanco and cefepim with concerns of Pseudomonas and health care associated infection -Mucinex for cough  -Urine S. pneumococcal antigen -Follow up blood culture x2, sputum culture, Flu pcr  Essential hypertension -Cardizem, metoprolol -On lasix  GERD: -Protonix  Depression and anxiety: Stable, no suicidal or homicidal ideations. -Continue home medications: BuSpar, Prozac  PAF: CHA2DS2-VASc Score is 4, needs oral anticoagulation. Heart rate is well controlled. Patient refuses anticoagulation. Patient is currently preferring to be on aspirin -ASA and Cardizem  Chronic diastolic (congestive) heart failure ([place]): 2-D echo on 03/07/14 showed EF 50-60%. Patient does not have leg edema, CHF is compensated. -Continue  aspirin, metoprolol and home dose Lasix -check BNP   DVT ppx: SQ Heparin   Code Status: Full code Family Communication: None at bed side. Disposition Plan: Admit to inpatient   Date of Service 02/24/2015    Ivor Costa [facility] Pager [phone]  If 7PM-7AM, please contact night-coverage [email or web address] Password [facility] 02/24/2015, 3:45 AM

## 2015-02-23 NOTE — ED Notes (Signed)
Bed: KI63 Expected date: 02/23/15 Expected time: 6:31 PM Means of arrival: Ambulance Comments: [facility]

## 2015-02-24 DIAGNOSIS — J441 Chronic obstructive pulmonary disease with (acute) exacerbation: Principal | ICD-10-CM

## 2015-02-24 DIAGNOSIS — J181 Lobar pneumonia, unspecified organism: Secondary | ICD-10-CM

## 2015-02-24 DIAGNOSIS — J9621 Acute and chronic respiratory failure with hypoxia: Secondary | ICD-10-CM

## 2015-02-24 DIAGNOSIS — J189 Pneumonia, unspecified organism: Secondary | ICD-10-CM | POA: Insufficient documentation

## 2015-02-24 LAB — INFLUENZA PANEL BY PCR (TYPE A & B)
H1N1 flu by pcr: NOT DETECTED
INFLBPCR: NEGATIVE
Influenza A By PCR: NEGATIVE

## 2015-02-24 LAB — CBC
HEMATOCRIT: 37.4 % (ref 36.0–46.0)
Hemoglobin: 10.8 g/dL — ABNORMAL LOW (ref 12.0–15.0)
MCH: 28.7 pg (ref 26.0–34.0)
MCHC: 28.9 g/dL — AB (ref 30.0–36.0)
MCV: 99.5 fL (ref 78.0–100.0)
Platelets: 212 10*3/uL (ref 150–400)
RBC: 3.76 MIL/uL — ABNORMAL LOW (ref 3.87–5.11)
RDW: 15.2 % (ref 11.5–15.5)
WBC: 6.3 10*3/uL (ref 4.0–10.5)

## 2015-02-24 LAB — COMPREHENSIVE METABOLIC PANEL
ALT: 13 U/L — AB (ref 14–54)
AST: 17 U/L (ref 15–41)
Albumin: 3.5 g/dL (ref 3.5–5.0)
Alkaline Phosphatase: 98 U/L (ref 38–126)
Anion gap: 12 (ref 5–15)
BILIRUBIN TOTAL: 0.7 mg/dL (ref 0.3–1.2)
BUN: 15 mg/dL (ref 6–20)
CHLORIDE: 103 mmol/L (ref 101–111)
CO2: 31 mmol/L (ref 22–32)
CREATININE: 0.76 mg/dL (ref 0.44–1.00)
Calcium: 9.5 mg/dL (ref 8.9–10.3)
Glucose, Bld: 155 mg/dL — ABNORMAL HIGH (ref 65–99)
Potassium: 4.4 mmol/L (ref 3.5–5.1)
Sodium: 146 mmol/L — ABNORMAL HIGH (ref 135–145)
TOTAL PROTEIN: 6.6 g/dL (ref 6.5–8.1)

## 2015-02-24 LAB — BRAIN NATRIURETIC PEPTIDE: B Natriuretic Peptide: 306.5 pg/mL — ABNORMAL HIGH (ref 0.0–100.0)

## 2015-02-24 LAB — STREP PNEUMONIAE URINARY ANTIGEN: Strep Pneumo Urinary Antigen: NEGATIVE

## 2015-02-24 MED ORDER — VANCOMYCIN HCL IN DEXTROSE 1-5 GM/200ML-% IV SOLN
1000.0000 mg | Freq: Two times a day (BID) | INTRAVENOUS | Status: DC
  Administered 2015-02-24 – 2015-02-28 (×10): 1000 mg via INTRAVENOUS
  Filled 2015-02-24 (×10): qty 200

## 2015-02-24 MED ORDER — ARFORMOTEROL TARTRATE 15 MCG/2ML IN NEBU
15.0000 ug | INHALATION_SOLUTION | Freq: Two times a day (BID) | RESPIRATORY_TRACT | Status: DC
  Administered 2015-02-24 – 2015-03-05 (×18): 15 ug via RESPIRATORY_TRACT
  Filled 2015-02-24 (×18): qty 2

## 2015-02-24 MED ORDER — IPRATROPIUM-ALBUTEROL 0.5-2.5 (3) MG/3ML IN SOLN
3.0000 mL | Freq: Four times a day (QID) | RESPIRATORY_TRACT | Status: DC
Start: 2015-02-24 — End: 2015-02-24
  Administered 2015-02-24: 3 mL via RESPIRATORY_TRACT
  Filled 2015-02-24 (×2): qty 3

## 2015-02-24 NOTE — Consult Note (Signed)
Name: Kelsey Garcia MRN: [id number] DOB: May 26, 1945    ADMISSION DATE:  02/23/2015 CONSULTATION DATE:  02/24/15  REFERRING MD :  Niu/ [facility]  CHIEF COMPLAINT: Short of breath  BRIEF PATIENT DESCRIPTION: [name] former smoker, followed by Dr Payton Spark for COPD with chronic hypoxic respiratory failure, complicated by chronic AF, dCHF. Was hosp for pneumonia/ COPD 12/21-12/23. Admitted now for gradually worsening dyspnea with rest and exertion. Sputum described as yellow after recent doxycycline.  SIGNIFICANT EVENTS    STUDIES:  CT chest 01/30/15   HISTORY OF PRESENT ILLNESS:   [name] former smoker, followed by Dr Payton Spark for COPD with chronic hypoxic respiratory failure, complicated by chronic AF, dCHF. Was hosp for pneumonia/ COPD 12/21-12/23. Admitted now for gradually worsening dyspnea with rest and exertion. Has hx recurrent pneumonias. Serial CT over past year has shown waxing/ waning lingular density that looks inflammatory, without bronch. She asks for a stabilizing regimen that avoids swings in airway function.No blood or chest pain.  PAST MEDICAL HISTORY :   has a past medical history of Atrial fibrillation ([place]); Hypertension; Insomnia; Nonischemic cardiomyopathy ([place]); Hyperlipidemia; Anxiety; Cholelithiasis; Insomnia; Mitral regurgitation; H/O epistaxis; Rhinitis, allergic; CHF (congestive heart failure) ([place]); Pneumonia; COPD (chronic obstructive pulmonary disease) ([place]); Chronic bronchitis with COPD (chronic obstructive pulmonary disease) ([place]); Shortness of breath; GERD (gastroesophageal reflux disease); Headache(784.0); Migraines; DJD (degenerative joint disease); Arthritis; OCD (obsessive compulsive disorder); OCD (obsessive compulsive disorder); Depression; and Thoracic vertebral fracture ([place]) (11/03/2014).  has past surgical history that includes Tubal ligation (5284) and Cardioversion (2003; 07/2003). Prior to Admission medications   Medication Sig Start Date End Date Taking?  Authorizing Provider  albuterol (PROVENTIL HFA;VENTOLIN HFA) 108 (90 BASE) MCG/ACT inhaler Inhale 1-2 puffs into the lungs every 6 (six) hours as needed for wheezing. 10/19/14  Yes Rigoberto Noel, MD  albuterol (PROVENTIL) (2.5 MG/3ML) 0.083% nebulizer solution Take 3 mLs (2.5 mg total) by nebulization every 6 (six) hours as needed for wheezing. 09/21/14  Yes Maryann Mikhail, DO  aspirin 325 MG EC tablet Take 650 mg by mouth daily.   Yes Historical Provider, MD  busPIRone (BUSPAR) 10 MG tablet Take 10 mg by mouth 3 (three) times daily. 04/22/14  Yes Historical Provider, MD  diltiazem (CARDIZEM) 120 MG tablet Take 1 tablet (120 mg total) by mouth every 8 (eight) hours. 02/01/15  Yes Velvet Bathe, MD  doxycycline (VIBRA-TABS) 100 MG tablet Take 100 mg by mouth 2 (two) times daily.   Yes Historical Provider, MD  FLUoxetine (PROZAC) 20 MG capsule TAKE THREE CAPSULES BY MOUTH ONCE DAILY 01/03/14  Yes Tasrif Ahmed, MD  furosemide (LASIX) 20 MG tablet Take 40 mg by mouth daily. She sometimes takes up to 5 tabs a day   Yes Historical Provider, MD  guaiFENesin (MUCINEX) 600 MG 12 hr tablet Take 600 mg by mouth 2 (two) times daily as needed for cough.   Yes Historical Provider, MD  HYDROcodone-homatropine (HYCODAN) 5-1.5 MG/5ML syrup Take 2.5 mLs by mouth 2 (two) times daily as needed for cough.   Yes Historical Provider, MD  ipratropium-albuterol (DUONEB) 0.5-2.5 (3) MG/3ML SOLN Take 3 mLs by nebulization 3 (three) times daily. 09/21/14  Yes Maryann Mikhail, DO  KLOR-CON M20 20 MEQ tablet Take 1 tablet by mouth daily. She will take up to 5 tabs a day 09/26/14  Yes Historical Provider, MD  metoprolol succinate (TOPROL-XL) 25 MG 24 hr tablet Take 1 tablet (25 mg total) by mouth daily. 02/14/15  Yes Brittainy Erie Noe, PA-C  NON FORMULARY Place 1 drop into both eyes 2 (two) times daily as needed (hurting, dry). thera- tears   Yes Historical Provider, MD  oxyCODONE-acetaminophen (PERCOCET/ROXICET) 5-325 MG per tablet Take  1 tablet by mouth every 8 (eight) hours as needed for severe pain.    Yes Historical Provider, MD  pantoprazole (PROTONIX) 40 MG tablet TAKE ONE TABLET BY MOUTH TWICE DAILY 01/03/14  Yes Dellia Nims, MD  SYMBICORT 160-4.5 MCG/ACT inhaler INHALE TWO PUFFS BY MOUTH TWICE DAILY 02/06/15  Yes Rigoberto Noel, MD  tiotropium (SPIRIVA) 18 MCG inhalation capsule Place 1 capsule (18 mcg total) into inhaler and inhale daily. 08/07/14  Yes Rigoberto Noel, MD   Allergies  Allergen Reactions  . Ace Inhibitors Cough  . Pseudoeph-Doxylamine-Dm-Apap Itching  . Diphenhydramine Hcl Other (See Comments)    "feels like my skin is crawling, and legs twitches"  . Erythromycin Diarrhea  . Nsaids Nausea And Vomiting  . Nyquil [Pseudoeph-Doxylamine-Dm-Apap] Itching  . Tramadol Hcl Other (See Comments)    stomach pain, hallucinations  . Codeine Swelling    Face and lips swelling  Patient report Can tolerate hydrocodone  . Fosamax [Alendronate] Other (See Comments)    Nausea and abdominal bloating.   . Tomato Other (See Comments)    Stomach swells  . Orange Fruit [Citrus] Rash  . Pseudoephedrine Palpitations    FAMILY HISTORY:  family history includes COPD in her mother; Cirrhosis in her father; Emphysema in her mother. SOCIAL HISTORY:  reports that she quit smoking about 8 months ago. Her smoking use included Cigarettes. She has a 35 pack-year smoking history. She has never used smokeless tobacco. She reports that she does not drink alcohol or use illicit drugs.  REVIEW OF SYSTEMS:  += pos Constitutional: Negative for fever, chills, weight loss, malaise/fatigue and diaphoresis.  HENT: Negative for hearing loss, ear pain, nosebleeds, congestion, sore throat, neck pain, tinnitus and ear discharge.   Eyes: Negative for blurred vision, double vision, photophobia, pain, discharge and redness.  Respiratory: + cough, hemoptysis, +sputum production, +shortness of breath, +wheezing and stridor.   Cardiovascular:  Negative for chest pain, palpitations, orthopnea, claudication, leg swelling and PND.  Gastrointestinal: Negative for heartburn, nausea, vomiting, abdominal pain, diarrhea, constipation, blood in stool and melena.  Genitourinary: Negative for dysuria, urgency, frequency, hematuria and flank pain.  Musculoskeletal: Negative for myalgias, back pain, joint pain and falls.  Skin: Negative for itching and rash.  Neurological: Negative for dizziness, tingling, tremors, sensory change, speech change, focal weakness, seizures, loss of consciousness, weakness and headaches.  Endo/Heme/Allergies: Negative for environmental allergies and polydipsia. Does not bruise/bleed easily.  SUBJECTIVE:   VITAL SIGNS: Temp:  [97.8 F (36.6 C)-98.3 F (36.8 C)] 97.8 F (36.6 C) (01/15 0552) Pulse Rate:  [74-102] 90 (01/15 1028) Resp:  [14-40] 20 (01/15 0552) BP: (104-161)/(52-131) 110/86 mmHg (01/15 1028) SpO2:  [94 %-100 %] 95 % (01/15 1047) Weight:  [83.4 kg (183 lb 13.8 oz)-83.643 kg (184 lb 6.4 oz)] 83.643 kg (184 lb 6.4 oz) (01/15 0552)  PHYSICAL EXAMINATION: General:  Obese, alert conversational woman, sitting in bed reading and using phone Neuro:  Non-focal, fully oriented HEENT:  Speech clear, vision and hearing grossly intact, no stridor Cardiovascular:  No jvd or peripheral edema. IRR/ Afib, don't hear murmur Lungs:  Distant, coarse w/o wheeze or cough, on nasal O2 Abdomen:  Obese, soft, non-tender Musculoskeletal:  Moving appropriately Skin:  No rash   Recent Labs Lab 02/23/15 1922 02/24/15 0524  NA 145 146*  K 3.7 4.4  CL 104 103  CO2 32 31  BUN 16 15  CREATININE 0.78 0.76  GLUCOSE 108* 155*    Recent Labs Lab 02/23/15 1922 02/24/15 0524  HGB 9.6* 10.8*  HCT 32.7* 37.4  WBC 7.0 6.3  PLT 206 212   Dg Chest 2 View  02/23/2015  CLINICAL DATA:  Shortness of breath and cough EXAM: CHEST  2 VIEW COMPARISON:  Chest radiograph February 04, 2015; chest CT January 30, 2015 FINDINGS:  There is persistent airspace consolidation in the superior lingular region. No new opacity is evident. Heart size and pulmonary vascularity are normal. No adenopathy. No bone lesions. IMPRESSION: Persistent infiltrate in the superior lingula. No new opacity. This finding in the lingula warrants additional imaging surveillance in approximately 10-14 days. If opacity persists in this area at that time, it would be reasonable to consider bronchoscopy to further assess this region, in particular to assess for possible underlying neoplastic focus or localized endobronchial lesion. Electronically Signed   By: Lowella Grip III M.D.   On: 02/23/2015 19:24   I reviewed CT images back to 5/ 2016  ASSESSMENT / PLAN: COPD mixed type, with exacerbation-  Viral respiratory infections are very common now, but she doesn't recognize an acute infection.   Plan- Try scheduled Brovana nebs for more stable control. Watch effect on heart rhythm  Acute on chronic respiratory failure with hypoxia- Continue supplemental O2  Lobar pneumonia, lingula- There has been density in this area for a year, now worse. It may just be an area of consolidated inflammation and scarring. We will want to consider bronchoscopy. This area may include bronchiectasis and be source for chronic discolored sputum.  CD Annamaria Boots, MD [facility]monary and [place] After 3:00 PM Pager: [phone]  02/24/2015, 12:44 PM

## 2015-02-24 NOTE — Evaluation (Signed)
Physical Therapy Evaluation Patient Details Name: Kelsey Garcia MRN: [id number] DOB: 08-15-1945 Today's Date: 02/24/2015   History of Present Illness  70 y.o. female with PMH of COPD on 2 L oxygen at home, atrial fibrillation (patient refused anticoagulant), hyperlipidemia, GERD, depression, anxiety, mitral regurgitation, diastolic congestive heart failure, DJD, OCD, who presents with productive cough, shortness of breath and wheezing. Recently hospitalized several times last year for same.   Clinical Impression  Pt admitted with above diagnosis. Pt currently with functional limitations due to the deficits listed below (see PT Problem List).  Pt will benefit from skilled PT to increase their independence and safety with mobility to allow discharge to the venue listed below.   Pt is motivated to work with PT, she wants to be better and more independent, she expresses a great deal of frustration with her illness and is emotional during PT session; will Continue to follow, may benefit from HHPT at D/C given multiple recent admissions     Follow Up Recommendations Home health PT;No PT follow up;Supervision - Intermittent (depending on progress)    Equipment Recommendations  None recommended by PT    Recommendations for Other Services       Precautions / Restrictions Precautions Precaution Comments: O2/ monitor sats Restrictions Weight Bearing Restrictions: No      Mobility  Bed Mobility Overal bed mobility: Modified Independent                Transfers Overall transfer level: Needs assistance Equipment used: None Transfers: Sit to/from Stand Sit to Stand: Min guard         General transfer comment: for lines and safety  Ambulation/Gait Ambulation/Gait assistance: Min guard Ambulation Distance (Feet): 90 Feet Assistive device: None (pt uses rail at times ) Gait Pattern/deviations: Step-through pattern;Decreased stride length;Trunk flexed     General Gait Details:  one standing rest, cues for breathing and to maintain steady gait velocity when getting SOB (avoid rapid gait speed); pt fatigues quickly, O2 sats 96% on 2L, DOE 3-4/4  Stairs            Wheelchair Mobility    Modified Rankin (Stroke Patients Only)       Balance Overall balance assessment: Needs assistance   Sitting balance-Leahy Scale: Good       Standing balance-Leahy Scale: Fair                               Pertinent Vitals/Pain Pain Assessment: No/denies pain    Home Living Family/patient expects to be discharged to:: Private residence Living Arrangements: Children Available Help at Discharge: Available PRN/intermittently Type of Home: House Home Access: Stairs to enter Entrance Stairs-Rails: Left Entrance Stairs-Number of Steps: 5-6 Home Layout: Two level;Bed/bath upstairs Home Equipment: Walker - 2 wheels;Wheelchair - power;Shower seat;Grab bars - tub/shower;Bedside commode      Prior Function Level of Independence: Independent with assistive device(s);Needs assistance;Independent   Gait / Transfers Assistance Needed: pt uses w/c or scooters in community for longer distances, amb without device in home;     Comments: Pt reports she spends most of her times upstairs since bed and bath are up there.   She has kitchenette upstairs and dtr provides dinner      Hand Dominance        Extremity/Trunk Assessment   Upper Extremity Assessment: Generalized weakness           Lower Extremity Assessment: Generalized weakness  Communication   Communication: No difficulties  Cognition Arousal/Alertness: Awake/alert Behavior During Therapy: WFL for tasks assessed/performed (labile) Overall Cognitive Status: Within Functional Limits for tasks assessed                      General Comments      Exercises        Assessment/Plan    PT Assessment Patient needs continued PT services  PT Diagnosis Difficulty walking    PT Problem List Cardiopulmonary status limiting activity;Decreased activity tolerance;Decreased balance  PT Treatment Interventions DME instruction;Gait training;Functional mobility training;Therapeutic activities;Patient/family education;Therapeutic exercise;Stair training   PT Goals (Current goals can be found in the Care Plan section) Acute Rehab PT Goals Patient Stated Goal: to be able to go to football game PT Goal Formulation: With patient Time For Goal Achievement: 03/03/15 Potential to Achieve Goals: Good    Frequency Min 3X/week   Barriers to discharge        Co-evaluation               End of Session Equipment Utilized During Treatment: Gait belt Activity Tolerance: Patient limited by fatigue Patient left: in bed;with call bell/phone within reach           Time: 1445-1506 PT Time Calculation (min) (ACUTE ONLY): 21 min   Charges:   PT Evaluation $PT Eval Low Complexity: 1 Procedure     PT G CodesKenyon Ana Mar 16, 2015, 3:21 PM

## 2015-02-24 NOTE — Progress Notes (Signed)
PROGRESS NOTE  Kelsey Garcia SWN:[id number] DOB: [birth year] DOA: 02/23/2015 PCP: Darden Amber, PA   HPI: 70 y.o. female with PMH of COPD on 2 L oxygen at home, atrial fibrillation (patient refused anticoagulant), hyperlipidemia, GERD, depression, anxiety, mitral regurgitation, diastolic congestive heart failure, DJD, OCD, who presents with productive cough, shortness of breath and wheezing. Recently hospitalized several times last year for same.   Subjective / 24 H Interval events - ongoing dyspnea, cough this morning  Assessment/Plan: Principal Problem:   Acute on chronic respiratory failure (HCC) Active Problems:   HLD (hyperlipidemia)   Depression with anxiety   Essential hypertension   GERD (gastroesophageal reflux disease)   Osteoporosis   COPD exacerbation (HCC)   Atrial fibrillation (HCC)   Chronic diastolic (congestive) heart failure (HCC)   Lobar pneumonia due to unspecified organism   Chronic obstructive pulmonary disease with acute exacerbation ([place])   HCAP (healthcare-associated pneumonia)    Acute on chronic respiratory failure (HCC) due to COPD exacerbation / HCAP  - CXR has persistent lingula infiltrate. This is chronic - CTA-chest on 01/21/15 did not have mass or PE.  - Pulmonary consulted, to consider bronchoscopy - continue IV steroids, antibiotics, nebulizers  Essential hypertension - Cardizem, metoprolol - On lasix - controlled to 115/82 today, no changes made  GERD - Protonix  Depression and anxiety  - Stable, no suicidal or homicidal ideations. - Continue home medications: BuSpar, Prozac  PAF: CHA2DS2-VASc Score is 4, needs oral anticoagulation. Heart rate is well controlled. Patient refuses anticoagulation. Patient is currently preferring to be on aspirin - ASA and Cardizem  Chronic diastolic (congestive) heart failure ([place])  - 2-D echo on 03/07/14 showed EF 50-60%. Patient does not have leg edema, CHF is compensated. - Continue aspirin,  metoprolol and home dose Lasix    Diet: Diet Heart Room service appropriate?: Yes; Fluid consistency:: Thin Fluids: none DVT Prophylaxis: heparin  Code Status: Full Code Family Communication: no family bedside  Disposition Plan: home when ready   Barriers to discharge: IV therapies  Consultants:  Pulmonary   Procedures:  None    Antibiotics Vancomycin 1/14 >> Cefepime 1/14 >>   Studies  Dg Chest 2 View  02/23/2015  CLINICAL DATA:  Shortness of breath and cough EXAM: CHEST  2 VIEW COMPARISON:  Chest radiograph February 04, 2015; chest CT January 30, 2015 FINDINGS: There is persistent airspace consolidation in the superior lingular region. No new opacity is evident. Heart size and pulmonary vascularity are normal. No adenopathy. No bone lesions. IMPRESSION: Persistent infiltrate in the superior lingula. No new opacity. This finding in the lingula warrants additional imaging surveillance in approximately 10-14 days. If opacity persists in this area at that time, it would be reasonable to consider bronchoscopy to further assess this region, in particular to assess for possible underlying neoplastic focus or localized endobronchial lesion. Electronically Signed   By: Lowella Grip III M.D.   On: 02/23/2015 19:24   Objective  Filed Vitals:   02/24/15 0552 02/24/15 1028 02/24/15 1047 02/24/15 1321  BP: 108/52 110/86  115/82  Pulse: 91 90    Temp: 97.8 F (36.6 C)     TempSrc: Oral     Resp: 20     Height:      Weight: 83.643 kg (184 lb 6.4 oz)     SpO2: 98%  95%     Intake/Output Summary (Last 24 hours) at 02/24/15 1347 Last data filed at 02/24/15 0700  Gross per 24 hour  Intake  480 ml  Output      0 ml  Net    480 ml   Filed Weights   02/24/15 0019 02/24/15 0552  Weight: 83.4 kg (183 lb 13.8 oz) 83.643 kg (184 lb 6.4 oz)   Exam:  GENERAL: NAD  HEENT: no scleral icterus, PERRL  NECK: supple, no LAD  LUNGS: CTA biL, mild wheezing  HEART: RRR without  MRG  ABDOMEN: soft, non tender  EXTREMITIES: no clubbing / cyanosis  NEUROLOGIC: non focal   Data Reviewed: Basic Metabolic Panel:  Recent Labs Lab 02/23/15 1922 02/24/15 0524  NA 145 146*  K 3.7 4.4  CL 104 103  CO2 32 31  GLUCOSE 108* 155*  BUN 16 15  CREATININE 0.78 0.76  CALCIUM 8.6* 9.5   Liver Function Tests:  Recent Labs Lab 02/24/15 0524  AST 17  ALT 13*  ALKPHOS 98  BILITOT 0.7  PROT 6.6  ALBUMIN 3.5   CBC:  Recent Labs Lab 02/23/15 1922 02/24/15 0524  WBC 7.0 6.3  NEUTROABS 4.7  --   HGB 9.6* 10.8*  HCT 32.7* 37.4  MCV 96.2 99.5  PLT 206 212   BNP (last 3 results)  Recent Labs  01/30/15 0625 02/04/15 1142 02/24/15 0524  BNP 181.4* 217.6* 306.5*    Scheduled Meds: . arformoterol  15 mcg Nebulization BID  . aspirin  650 mg Oral Daily  . busPIRone  10 mg Oral TID  . ceFEPime (MAXIPIME) IV  1 g Intravenous Q8H  . dextromethorphan-guaiFENesin  1 tablet Oral BID  . diltiazem  120 mg Oral 3 times per day  . FLUoxetine  20 mg Oral Daily  . furosemide  40 mg Oral Daily  . heparin  5,000 Units Subcutaneous 3 times per day  . methylPREDNISolone (SOLU-MEDROL) injection  60 mg Intravenous Q12H  . metoprolol succinate  25 mg Oral Daily  . pantoprazole  40 mg Oral Daily  . sodium chloride  3 mL Intravenous Q12H  . tiotropium  18 mcg Inhalation Daily  . vancomycin  1,000 mg Intravenous Q12H   Continuous Infusions:   Marzetta Board, MD [facility] Pager [phone]. If 7 PM - 7 AM, please contact night-coverage at [email or web address], password [facility] 02/24/2015, 1:47 PM  LOS: 1 day

## 2015-02-25 ENCOUNTER — Inpatient Hospital Stay (HOSPITAL_COMMUNITY): Payer: Medicare Other

## 2015-02-25 MED ORDER — ALBUTEROL SULFATE (2.5 MG/3ML) 0.083% IN NEBU
3.0000 mL | INHALATION_SOLUTION | RESPIRATORY_TRACT | Status: DC | PRN
  Administered 2015-02-25 – 2015-03-05 (×4): 3 mL via RESPIRATORY_TRACT
  Filled 2015-02-25 (×5): qty 3

## 2015-02-25 MED ORDER — IPRATROPIUM-ALBUTEROL 0.5-2.5 (3) MG/3ML IN SOLN
3.0000 mL | Freq: Three times a day (TID) | RESPIRATORY_TRACT | Status: DC
  Administered 2015-02-25 – 2015-02-27 (×6): 3 mL via RESPIRATORY_TRACT
  Filled 2015-02-25 (×6): qty 3

## 2015-02-25 NOTE — Progress Notes (Signed)
RT called to administer PRN treatment X2. Per RT protocol, patient was placed on TID Duoneb treatments. Patient is stable currently and no complicationa are noted at this time. RT will continue to monitor patient.

## 2015-02-25 NOTE — Evaluation (Signed)
Occupational Therapy Evaluation Patient Details Name: Kelsey Garcia MRN: [id number] DOB: February 09, 1946 Today's Date: 02/25/2015    History of Present Illness 70 y.o. female with PMH of COPD on 2 L oxygen at home, atrial fibrillation (patient refused anticoagulant), hyperlipidemia, GERD, depression, anxiety, mitral regurgitation, diastolic congestive heart failure, DJD, OCD, who presents with productive cough, shortness of breath and wheezing. Recently hospitalized several times last year for same.    Clinical Impression   Patient presenting with decreased ADL and functional mobility independence secondary to above. Patient independent to mod I PTA. Patient currently functioning at an overall min assist level. Patient will benefit from acute OT to increase overall independence in the areas of ADLs, functional mobility, and overall safety in order to safely discharge home with daughter assisting prn.     Follow Up Recommendations  No OT follow up;Supervision - Intermittent    Equipment Recommendations  None recommended by OT    Recommendations for Other Services  None at this time    Precautions / Restrictions Precautions Precautions: Fall Precaution Comments: O2/ monitor sats Restrictions Weight Bearing Restrictions: No    Mobility Bed Mobility Overal bed mobility: Modified Independent   Transfers Overall transfer level: Needs assistance Equipment used: None Transfers: Stand Pivot Transfers Sit to Stand: Min guard Stand pivot transfers: Min guard       General transfer comment: for lines and safety, BSC to EOB    Balance Overall balance assessment: Needs assistance Sitting-balance support: No upper extremity supported;Feet supported Sitting balance-Leahy Scale: Good     Standing balance support: No upper extremity supported;During functional activity Standing balance-Leahy Scale: Fair    ADL Overall ADL's : Needs assistance/impaired Eating/Feeding: Set up;Sitting    Grooming: Set up;Sitting   Upper Body Bathing: Set up;Sitting   Lower Body Bathing: Minimal assistance;Sit to/from stand   Upper Body Dressing : Set up;Sitting   Lower Body Dressing: Minimal assistance;Sit to/from stand   Toilet Transfer: Min guard;BSC   Toileting- Water quality scientist and Hygiene: Supervision/safety;Sitting/lateral Chartered certified accountant Details (indicate cue type and reason): did not occur   General ADL Comments: Pt reports not feeling well today. She states that she is not able to participate fully in therapy session due to having an "off day". Pt did perform BSC to EOB transfer and therapist discussed energy conservation techniques. Plan to bring in energy conservation educational handout next OT session.     Pertinent Vitals/Pain Pain Assessment: Faces Faces Pain Scale: Hurts little more Pain Location: along abdomen Pain Descriptors / Indicators: Grimacing Pain Intervention(s): Monitored during session;Limited activity within patient's tolerance     Hand Dominance Right   Extremity/Trunk Assessment Upper Extremity Assessment Upper Extremity Assessment: Generalized weakness   Lower Extremity Assessment Lower Extremity Assessment: Generalized weakness   Cervical / Trunk Assessment Cervical / Trunk Assessment: Normal   Communication Communication Communication: No difficulties   Cognition Arousal/Alertness: Awake/alert Behavior During Therapy: WFL for tasks assessed/performed Overall Cognitive Status: Within Functional Limits for tasks assessed              Home Living Family/patient expects to be discharged to:: Private residence Living Arrangements: Children Available Help at Discharge: Available PRN/intermittently Type of Home: House Home Access: Stairs to enter Technical brewer of Steps: 5-6 Entrance Stairs-Rails: Left Home Layout: Two level;Bed/bath upstairs;1/2 bath on main level Alternate Level Stairs-Number of Steps:  14 Alternate Level Stairs-Rails: Right;Left Bathroom Shower/Tub: Tub/shower unit;Curtain   Biochemist, clinical: Standard     Home Equipment: Gilford Rile -  2 wheels;Wheelchair - power;Shower seat;Grab bars - tub/shower;Bedside commode   Additional Comments: Pt states she takes baths normally      Prior Functioning/Environment Level of Independence: Independent with assistive device(s);Needs assistance;Independent  Gait / Transfers Assistance Needed: pt uses w/c or scooters in community for longer distances, amb without device in home;     Comments: Pt reports she spends most of her times upstairs since bed and bath are up there.   She has kitchenette upstairs and dtr provides dinner     OT Diagnosis: Generalized weakness;Acute pain   OT Problem List: Decreased strength;Decreased activity tolerance;Impaired balance (sitting and/or standing);Decreased safety awareness;Decreased knowledge of precautions;Pain   OT Treatment/Interventions: Self-care/ADL training;Therapeutic exercise;Energy conservation;DME and/or AE instruction;Therapeutic activities;Patient/family education;Balance training    OT Goals(Current goals can be found in the care plan section) Acute Rehab OT Goals Patient Stated Goal: start to feel better OT Goal Formulation: With patient Time For Goal Achievement: 03/11/15 Potential to Achieve Goals: Good ADL Goals Pt Will Perform Grooming: with modified independence;standing Pt Will Perform Lower Body Bathing: with modified independence;sit to/from stand Pt Will Perform Lower Body Dressing: with modified independence;sit to/from stand Pt Will Transfer to Toilet: with modified independence;ambulating;regular height toilet Pt Will Perform Tub/Shower Transfer: Tub transfer;ambulating;with supervision (in and out of tub) Additional ADL Goal #1: Pt will be mod I with functional mobility/ambulation during ADLs and IADLs Additional ADL Goal #2: Pt will be able to verbalize at least 3  energy conservation techniques for ADLs and IADLs   OT Frequency: Min 2X/week   Barriers to D/C: none known at this time   End of Session Equipment Utilized During Treatment: Oxygen  Activity Tolerance: Patient tolerated treatment well Patient left: in bed;with call bell/phone within reach   Time: 8101-7510 OT Time Calculation (min): 13 min Charges:  OT General Charges $OT Visit: 1 Procedure OT Evaluation $OT Eval Moderate Complexity: 1 Procedure  Chrys Racer , MS, OTR/L, CLT Pager: [phone]  02/25/2015, 3:17 PM

## 2015-02-25 NOTE — Progress Notes (Signed)
PCCM PROGRESS NOTE  Subjective: Still has cough with chest congestion and intermittent wheeze.  Vital Signs: BP 127/67 mmHg  Pulse 100  Temp(Src) 98.3 F (36.8 C) (Oral)  Resp 22  Ht '5\' 5"'$  (1.651 m)  Wt 185 lb (83.915 kg)  BMI 30.79 kg/m2  SpO2 96%  General: pleasant HEENT: no sinus tenderness Cardiac: regular Chest: faint b/l wheeze Lt > Rt Abd: soft, non tender Ext: no edema  CMP Latest Ref Rng 02/24/2015 02/23/2015 02/04/2015  Glucose 65 - 99 mg/dL 155(H) 108(H) 137(H)  BUN 6 - 20 mg/dL '15 16 20  '$ Creatinine 0.44 - 1.00 mg/dL 0.76 0.78 0.76  Sodium 135 - 145 mmol/L 146(H) 145 143  Potassium 3.5 - 5.1 mmol/L 4.4 3.7 3.7  Chloride 101 - 111 mmol/L 103 104 103  CO2 22 - 32 mmol/L 31 32 29  Calcium 8.9 - 10.3 mg/dL 9.5 8.6(L) 8.8(L)  Total Protein 6.5 - 8.1 g/dL 6.6 - -  Total Bilirubin 0.3 - 1.2 mg/dL 0.7 - -  Alkaline Phos 38 - 126 U/L 98 - -  AST 15 - 41 U/L 17 - -  ALT 14 - 54 U/L 13(L) - -    CBC Latest Ref Rng 02/24/2015 02/23/2015 02/04/2015  WBC 4.0 - 10.5 K/uL 6.3 7.0 8.6  Hemoglobin 12.0 - 15.0 g/dL 10.8(L) 9.6(L) 11.1(L)  Hematocrit 36.0 - 46.0 % 37.4 32.7(L) 36.6  Platelets 150 - 400 K/uL 212 206 191    Dg Chest 2 View  02/23/2015  CLINICAL DATA:  Shortness of breath and cough EXAM: CHEST  2 VIEW COMPARISON:  Chest radiograph February 04, 2015; chest CT January 30, 2015 FINDINGS: There is persistent airspace consolidation in the superior lingular region. No new opacity is evident. Heart size and pulmonary vascularity are normal. No adenopathy. No bone lesions. IMPRESSION: Persistent infiltrate in the superior lingula. No new opacity. This finding in the lingula warrants additional imaging surveillance in approximately 10-14 days. If opacity persists in this area at that time, it would be reasonable to consider bronchoscopy to further assess this region, in particular to assess for possible underlying neoplastic focus or localized endobronchial lesion.  Electronically Signed   By: Lowella Grip III M.D.   On: 02/23/2015 19:24   Discussion: 70 yo female former smoker was in hospital in December 2016 with pneumonia, presented with progressive dyspnea, cough, and sputum.  She has persistent/progressive lingular infiltrate.  She is followed by [name] for COPD and chronic hypoxic respiratory failure.  She also has hx of chronic diastolic CHF, A fib, HLD, HTN, non ischemic CM, GERD, OCD.  Assessment/Plan:  COPD exacerbation. - continue brovana, spiriva - continue solumedrol - prn BDs - Abx per primary team - bronchial hygiene  Acute on chronic respiratory failure with hypoxia. - adjust oxygen to keep SpO2 90 to 95%  Persistent lingular infiltrate. - repeat CT chest w/o contrast and then consider bronchoscopy   Chesley Mires, MD [place] 02/25/2015, 11:05 AM Pager:  [phone] After 3pm call: [phone]

## 2015-02-25 NOTE — Progress Notes (Signed)
Nutrition Brief Note  Consult received for assessment of nutrition requirements/status   Wt Readings from Last 15 Encounters:  02/25/15 185 lb (83.915 kg)  02/14/15 186 lb (84.369 kg)  02/01/15 170 lb 13.7 oz (77.5 kg)  11/30/14 175 lb (79.379 kg)  11/04/14 177 lb 1.6 oz (80.332 kg)  10/16/14 184 lb (83.462 kg)  09/28/14 184 lb (83.462 kg)  09/21/14 177 lb 14.6 oz (80.7 kg)  08/29/14 176 lb 11.2 oz (80.151 kg)  07/31/14 184 lb (83.462 kg)  07/22/14 179 lb 7.3 oz (81.4 kg)  07/12/14 188 lb (85.276 kg)  07/08/14 184 lb (83.462 kg)  07/02/14 187 lb (84.823 kg)  06/27/14 188 lb (85.276 kg)    Body mass index is 30.79 kg/(m^2). Patient meets criteria for obesity based on current BMI.   Pt states that she had previously been taking part in an exercise program in an attempt to lose weight. She states that recently she has been unable to exercise but that she has kept the weight off. She states good appetite and a focus on small meals throughout the day PTA. Pt reports that for breakfast she consumed an omelet and fruit and for lunch she had fish, coleslaw, and banana pudding. She plans to order vegetables for dinner as she is experiencing increase in reflux today.  Current diet order is Heart Healthy, patient is consuming approximately 100% of meals at this time. Labs and medications reviewed.   No nutrition interventions warranted at this time. If nutrition issues arise, please consult RD.      Jarome Matin, RD, LDN Inpatient Clinical Dietitian Pager # [phone] After hours/weekend pager # [phone]

## 2015-02-25 NOTE — Progress Notes (Addendum)
PROGRESS NOTE  Kelsey Garcia HQP:[id number] DOB: [birth year] DOA: 02/23/2015 PCP: Darden Amber, PA   HPI: 70 y.o. female with PMH of COPD on 2 L oxygen at home, atrial fibrillation (patient refused anticoagulant), hyperlipidemia, GERD, depression, anxiety, mitral regurgitation, diastolic congestive heart failure, DJD, OCD, who presents with productive cough, shortness of breath and wheezing. Recently hospitalized several times last year for same.   Subjective / 24 H Interval events - Patient doesn't feel well this morning, cannot elaborate too much. She denies any severe shortness of breath and feels like her breathing is worse than baseline however stable  Assessment/Plan: Principal Problem:   Acute on chronic respiratory failure (HCC) Active Problems:   HLD (hyperlipidemia)   Depression with anxiety   Essential hypertension   GERD (gastroesophageal reflux disease)   Osteoporosis   COPD exacerbation (HCC)   Atrial fibrillation (HCC)   Chronic diastolic (congestive) heart failure (HCC)   Lobar pneumonia due to unspecified organism   Chronic obstructive pulmonary disease with acute exacerbation ([place])   HCAP (healthcare-associated pneumonia)   Acute on chronic respiratory failure (HCC) due to COPD exacerbation / HCAP  - CXR has persistent lingula infiltrate. This is chronic - CTA-chest on 01/21/15 did not have mass or PE.  - Pulmonary consulted, appreciate input, to get a CT scan of the chest today and consider bronchoscopy - continue IV steroids, antibiotics, nebulizers - Added Brovana, continue to monitor respiratory status.  Essential hypertension - Cardizem, metoprolol - On lasix - Controlled today at 127/67, will continue current regimen  GERD - Protonix  Depression and anxiety  - Stable, no suicidal or homicidal ideations. - Continue home medications: BuSpar, Prozac  PAF: CHA2DS2-VASc Score is 4, needs oral anticoagulation. Heart rate is well controlled. Patient  refuses anticoagulation. Patient is currently preferring to be on aspirin - ASA and Cardizem - Rate controlled, telemetry reviewed today, heart rates 70 - 100   Chronic diastolic (congestive) heart failure (HCC)  - 2-D echo on 03/07/14 showed EF 50-60%. Patient does not have leg edema, CHF is compensated. - Continue aspirin, metoprolol and home dose Lasix    Diet: Diet Heart Room service appropriate?: Yes; Fluid consistency:: Thin Fluids: none DVT Prophylaxis: heparin  Code Status: Full Code Family Communication: no family bedside  Disposition Plan: home when ready   Barriers to discharge: IV therapies  Consultants:  Pulmonary   Procedures:  None    Antibiotics Vancomycin 1/14 >> Cefepime 1/14 >>   Studies  Dg Chest 2 View  02/23/2015  CLINICAL DATA:  Shortness of breath and cough EXAM: CHEST  2 VIEW COMPARISON:  Chest radiograph February 04, 2015; chest CT January 30, 2015 FINDINGS: There is persistent airspace consolidation in the superior lingular region. No new opacity is evident. Heart size and pulmonary vascularity are normal. No adenopathy. No bone lesions. IMPRESSION: Persistent infiltrate in the superior lingula. No new opacity. This finding in the lingula warrants additional imaging surveillance in approximately 10-14 days. If opacity persists in this area at that time, it would be reasonable to consider bronchoscopy to further assess this region, in particular to assess for possible underlying neoplastic focus or localized endobronchial lesion. Electronically Signed   By: Lowella Grip III M.D.   On: 02/23/2015 19:24   Objective  Filed Vitals:   02/25/15 0021 02/25/15 0433 02/25/15 0918 02/25/15 0954  BP:  127/67    Pulse:  82  100  Temp:  98.3 F (36.8 C)    TempSrc:  Oral  Resp:  24  22  Height:      Weight:  83.915 kg (185 lb)    SpO2: 93% 98% 95% 96%    Intake/Output Summary (Last 24 hours) at 02/25/15 1257 Last data filed at 02/25/15 1100   Gross per 24 hour  Intake    730 ml  Output      8 ml  Net    722 ml   Filed Weights   02/24/15 0019 02/24/15 0552 02/25/15 0433  Weight: 83.4 kg (183 lb 13.8 oz) 83.643 kg (184 lb 6.4 oz) 83.915 kg (185 lb)   Exam:  GENERAL: NAD  HEENT: no scleral icterus, PERRL  NECK: supple, no LAD  LUNGS: CTA biL, mild wheezing  HEART: RRR without MRG  ABDOMEN: soft, non tender  EXTREMITIES: no clubbing / cyanosis  NEUROLOGIC: non focal   Data Reviewed: Basic Metabolic Panel:  Recent Labs Lab 02/23/15 1922 02/24/15 0524  NA 145 146*  K 3.7 4.4  CL 104 103  CO2 32 31  GLUCOSE 108* 155*  BUN 16 15  CREATININE 0.78 0.76  CALCIUM 8.6* 9.5   Liver Function Tests:  Recent Labs Lab 02/24/15 0524  AST 17  ALT 13*  ALKPHOS 98  BILITOT 0.7  PROT 6.6  ALBUMIN 3.5   CBC:  Recent Labs Lab 02/23/15 1922 02/24/15 0524  WBC 7.0 6.3  NEUTROABS 4.7  --   HGB 9.6* 10.8*  HCT 32.7* 37.4  MCV 96.2 99.5  PLT 206 212   BNP (last 3 results)  Recent Labs  01/30/15 0625 02/04/15 1142 02/24/15 0524  BNP 181.4* 217.6* 306.5*    Scheduled Meds: . arformoterol  15 mcg Nebulization BID  . aspirin  650 mg Oral Daily  . busPIRone  10 mg Oral TID  . ceFEPime (MAXIPIME) IV  1 g Intravenous Q8H  . dextromethorphan-guaiFENesin  1 tablet Oral BID  . diltiazem  120 mg Oral 3 times per day  . FLUoxetine  20 mg Oral Daily  . furosemide  40 mg Oral Daily  . heparin  5,000 Units Subcutaneous 3 times per day  . methylPREDNISolone (SOLU-MEDROL) injection  60 mg Intravenous Q12H  . metoprolol succinate  25 mg Oral Daily  . pantoprazole  40 mg Oral Daily  . sodium chloride  3 mL Intravenous Q12H  . tiotropium  18 mcg Inhalation Daily  . vancomycin  1,000 mg Intravenous Q12H   Continuous Infusions:   Marzetta Board, MD [facility] Pager [phone]. If 7 PM - 7 AM, please contact night-coverage at [email or web address], password [facility] 02/25/2015, 12:57 PM  LOS: 2 days

## 2015-02-26 DIAGNOSIS — J189 Pneumonia, unspecified organism: Secondary | ICD-10-CM | POA: Insufficient documentation

## 2015-02-26 LAB — EXPECTORATED SPUTUM ASSESSMENT W REFEX TO RESP CULTURE

## 2015-02-26 LAB — CREATININE, SERUM: CREATININE: 0.74 mg/dL (ref 0.44–1.00)

## 2015-02-26 LAB — EXPECTORATED SPUTUM ASSESSMENT W GRAM STAIN, RFLX TO RESP C

## 2015-02-26 LAB — VANCOMYCIN, TROUGH: VANCOMYCIN TR: 17 ug/mL (ref 10.0–20.0)

## 2015-02-26 MED ORDER — BUTAMBEN-TETRACAINE-BENZOCAINE 2-2-14 % EX AERO
1.0000 | INHALATION_SPRAY | Freq: Once | CUTANEOUS | Status: DC
  Filled 2015-02-26: qty 20

## 2015-02-26 MED ORDER — HYDROMORPHONE HCL 1 MG/ML IJ SOLN
0.5000 mg | INTRAMUSCULAR | Status: DC | PRN
  Administered 2015-02-28 – 2015-03-01 (×3): 1 mg via INTRAVENOUS
  Filled 2015-02-26 (×3): qty 1

## 2015-02-26 MED ORDER — LIDOCAINE HCL 2 % EX GEL
1.0000 "application " | Freq: Once | CUTANEOUS | Status: DC
  Filled 2015-02-26: qty 5

## 2015-02-26 NOTE — Progress Notes (Signed)
PCCM PROGRESS NOTE  Subjective:  Pt anxious over pending procedure.  Still has cough and chest congestion.  Vital Signs: BP 118/57 mmHg  Pulse 88  Temp(Src) 98.1 F (36.7 C) (Oral)  Resp 18  Ht '5\' 5"'$  (1.651 m)  Wt 183 lb 6.8 oz (83.2 kg)  BMI 30.52 kg/m2  SpO2 96%  General: pleasant, no distress HEENT: no sinus tenderness Cardiac: regular Chest: faint b/l wheeze Lt > Rt Abd: soft, non tender Ext: no edema  CMP Latest Ref Rng 02/24/2015 02/23/2015 02/04/2015  Glucose 65 - 99 mg/dL 155(H) 108(H) 137(H)  BUN 6 - 20 mg/dL '15 16 20  '$ Creatinine 0.44 - 1.00 mg/dL 0.76 0.78 0.76  Sodium 135 - 145 mmol/L 146(H) 145 143  Potassium 3.5 - 5.1 mmol/L 4.4 3.7 3.7  Chloride 101 - 111 mmol/L 103 104 103  CO2 22 - 32 mmol/L 31 32 29  Calcium 8.9 - 10.3 mg/dL 9.5 8.6(L) 8.8(L)  Total Protein 6.5 - 8.1 g/dL 6.6 - -  Total Bilirubin 0.3 - 1.2 mg/dL 0.7 - -  Alkaline Phos 38 - 126 U/L 98 - -  AST 15 - 41 U/L 17 - -  ALT 14 - 54 U/L 13(L) - -    CBC Latest Ref Rng 02/24/2015 02/23/2015 02/04/2015  WBC 4.0 - 10.5 K/uL 6.3 7.0 8.6  Hemoglobin 12.0 - 15.0 g/dL 10.8(L) 9.6(L) 11.1(L)  Hematocrit 36.0 - 46.0 % 37.4 32.7(L) 36.6  Platelets 150 - 400 K/uL 212 206 191    Ct Chest Wo Contrast  02/25/2015  CLINICAL DATA:  Progressive dyspnea with cough. EXAM: CT CHEST WITHOUT CONTRAST TECHNIQUE: Multidetector CT imaging of the chest was performed following the standard protocol without IV contrast. COMPARISON:  01/30/2015. FINDINGS: Mediastinum / Lymph Nodes: There is no axillary lymphadenopathy. 9 mm short axis precarinal lymph node is stable. No mediastinal lymphadenopathy. No definite hilar lymphadenopathy although assessment is limited by the lack of intravenous contrast material. The heart is enlarged. Coronary artery calcification is noted. The esophagus has normal imaging features. Lungs / Pleura: Emphysema noted bilaterally. Segmental opacification in the lingula persists without substantial  change. The no evidence for pulmonary edema or pleural effusion. Upper Abdomen:  Calcified gallstones measure up to 18 mm diameter. MSK / Soft Tissues: Bone windows reveal no worrisome lytic or sclerotic osseous lesions. Compression deformity again noted at T5 and T6. IMPRESSION: Stable appearance of confluent consolidation in the lingula. Given the persistence, neoplasm be considered. PET-CT may prove helpful to further evaluate. Compression fractures T5 and T6. Cholelithiasis. Electronically Signed   By: Misty Stanley M.D.   On: 02/25/2015 13:39   Discussion: 70 y/o female former smoker was in hospital in December 2016 with pneumonia, presented with progressive dyspnea, cough, and sputum.  She has persistent/progressive lingular infiltrate.  She is followed by [name] for COPD and chronic hypoxic respiratory failure.  She also has hx of chronic diastolic CHF, A fib, HLD, HTN, non ischemic CM, GERD, OCD.  Assessment/Plan:  COPD exacerbation. - continue brovana, spiriva - continue solumedrol - prn BDs - Abx per primary team - bronchial hygiene  Acute on chronic respiratory failure with hypoxia. - adjust oxygen to keep SpO2 90 to 95%  Persistent lingular infiltrate. - CT chest with persistent confluent consolidation in lingula, diagnostic FOB arranged for 0830 am 1/18  - Pre-bronch order set in place  Bronchoscopy procedure explained.  Alternatives discussed.  Risks detailed as bleeding, infection, pneumothorax, and non diagnosis.   Chesley Mires, MD  [place] 02/26/2015, 10:17 AM Pager:  [phone] After 3pm call: [phone]

## 2015-02-26 NOTE — Consult Note (Signed)
   [facility] CM Inpatient Consult   02/26/2015  Kelsey Garcia [birth year] [id number]   Patient had been followed by [facility] in the past. Spoke with patient at bedside and discussed [facility] services and whether she would like to continue with [facility] follow up. Discussed other options and patient states she rather receive EMMI calls and [facility] follow up appropriately after EMMI. Of note, patient does not currently have a [facility] Primary Care MD. Discussed this with patient at last admission. She can benefit from [facility] calls however. Made inpatient RNCM aware.  [name], MSN-Ed, RN,BSN [facility] Liaison [phone]

## 2015-02-26 NOTE — Care Management Important Message (Signed)
Important Message  Patient Details  Name: FAWNE HUGHLEY MRN: [id number] Date of Birth: 1945/12/12   Medicare Important Message Given:  Yes    Camillo Flaming 02/26/2015, 10:03 AMImportant Message  Patient Details  Name: PERPETUA ELLING MRN: [id number] Date of Birth: 06-21-1945   Medicare Important Message Given:  Yes    Camillo Flaming 02/26/2015, 10:03 AM

## 2015-02-26 NOTE — Progress Notes (Addendum)
Pharmacy Antibiotic Follow-up Note  Kelsey Garcia is a 70 y.o. year-old female admitted on 02/23/2015.  The patient is currently on day #3 of vancomycin/cefepime for pneumonia.  Xrays have revealed persistent lingular infiltrate, she is scheduled to have bronchoscopy. Respiratory culture collected 1/17  Assessment/Plan:  Vancomycin trough today was within desired range, continue vancomycin 1gm IV q12h   Cefepime 1gm IV q8h appropriately dose for renal function and indication  Temp (24hrs), Avg:97.9 F (36.6 C), Min:97.5 F (36.4 C), Max:98.2 F (36.8 C)   Recent Labs Lab 02/23/15 1922 02/24/15 0524  WBC 7.0 6.3    Recent Labs Lab 02/23/15 1922 02/24/15 0524 02/26/15 1055  CREATININE 0.78 0.76 0.74   Estimated Creatinine Clearance: 71.7 mL/min (by C-G formula based on Cr of 0.74).    Allergies  Allergen Reactions  . Ace Inhibitors Cough  . Pseudoeph-Doxylamine-Dm-Apap Itching  . Diphenhydramine Hcl Other (See Comments)    "feels like my skin is crawling, and legs twitches"  . Erythromycin Diarrhea  . Nsaids Nausea And Vomiting  . Nyquil [Pseudoeph-Doxylamine-Dm-Apap] Itching  . Tramadol Hcl Other (See Comments)    stomach pain, hallucinations  . Codeine Swelling    Face and lips swelling  Patient report Can tolerate hydrocodone  . Fosamax [Alendronate] Other (See Comments)    Nausea and abdominal bloating.   . Tomato Other (See Comments)    Stomach swells  . Orange Fruit [Citrus] Rash  . Pseudoephedrine Palpitations    Antimicrobials this admission: 1/14 >>cefepime >>  1/14 >>vancomycin >>   Levels/dose changes this admission: 1/17 1100 VT = 17 mcg/ml on 1g q12h (prior to 6th dose)  Microbiology results: 1/15 influenza panel NEG  1/15 Strep pneumo ur ag: NEG 1/17 sputum cx:   Thank you for allowing pharmacy to be a part of this patient's care.  Doreene Eland, PharmD, BCPS.   Pager: [phone] 02/26/2015 11:51 AM

## 2015-02-26 NOTE — Progress Notes (Signed)
Physical Therapy Treatment Patient Details Name: Kelsey Garcia MRN: [id number] DOB: 09/06/1945 Today's Date: 2015/03/20    History of Present Illness 70 y.o. female with PMH of COPD on 2 L oxygen at home, atrial fibrillation (patient refused anticoagulant), hyperlipidemia, GERD, depression, anxiety, mitral regurgitation, diastolic congestive heart failure, DJD, OCD, who presents with productive cough, shortness of breath and wheezing. Recently hospitalized several times last year for same.     PT Comments    Pt ambulated in hallway to tolerance, remained on 2L O2 [place].  Follow Up Recommendations  Supervision - Intermittent;Home health PT     Equipment Recommendations  None recommended by PT    Recommendations for Other Services       Precautions / Restrictions Precautions Precautions: Fall Precaution Comments: O2/ monitor sats    Mobility  Bed Mobility Overal bed mobility: Modified Independent                Transfers Overall transfer level: Needs assistance Equipment used: None Transfers: Sit to/from Stand Sit to Stand: Min guard         General transfer comment: for lines and safety  Ambulation/Gait Ambulation/Gait assistance: Min guard Ambulation Distance (Feet): 90 Feet Assistive device: None (pt uses rail at times ) Gait Pattern/deviations: Step-through pattern;Antalgic;Decreased stride length;Trunk flexed     General Gait Details: 6 standing rest breaks required, pt also reports more knee pain today, cues for breathing, pt fatigues quickly, O2 sats 94% on 2L end of ambulation, DOE 3-4/4   Stairs            Wheelchair Mobility    Modified Rankin (Stroke Patients Only)       Balance                                    Cognition Arousal/Alertness: Awake/alert Behavior During Therapy: WFL for tasks assessed/performed Overall Cognitive Status: Within Functional Limits for tasks assessed                       Exercises      General Comments        Pertinent Vitals/Pain Pain Assessment: 0-10 Pain Score: 10-Worst pain ever Pain Location: knees and shoulder (reports both chronic) Pain Descriptors / Indicators: Aching Pain Intervention(s): Limited activity within patient's tolerance;Monitored during session;Repositioned (pt notified RN, not yet time for meds)    Home Living                      Prior Function            PT Goals (current goals can now be found in the care plan section) Progress towards PT goals: Progressing toward goals    Frequency  Min 3X/week    PT Plan Current plan remains appropriate    Co-evaluation             End of Session Equipment Utilized During Treatment: Gait belt Activity Tolerance: Patient limited by fatigue Patient left: with nursing/sitter in room (sitting on [facility] with NT)     Time: 1572-6203 PT Time Calculation (min) (ACUTE ONLY): 13 min  Charges:  $Gait Training: 8-22 mins                    G Codes:      Gaje Tennyson,KATHrine E 20-Mar-2015, 1:46 PM Carmelia Bake, PT, DPT Mar 20, 2015 Pager: [phone]

## 2015-02-26 NOTE — Progress Notes (Signed)
PROGRESS NOTE  Kelsey Garcia SLH:[id number] DOB: Feb 13, 1945 DOA: 02/23/2015 PCP: Darden Amber, PA   HPI: 70 y.o. female with PMH of COPD on 2 L oxygen at home, atrial fibrillation (patient refused anticoagulant), hyperlipidemia, GERD, depression, anxiety, mitral regurgitation, diastolic congestive heart failure, DJD, OCD, who presents with productive cough, shortness of breath and wheezing. Recently hospitalized several times last year for same.   Interval events Patient was admitted on 1/14 with productive cough and shortness of breath. Pulmonology was consulted and are following. It appears that she has a persistent infiltrate in the lingula, and pulmonology plans a bronchoscopy tomorrow  Subjective  - She has no complaints this morning, endorses shortness of breath which she has been for the past few days. She appreciates no improvement. She feels however better than yesterday.  Assessment/Plan: Principal Problem:   Acute on chronic respiratory failure (HCC) Active Problems:   HLD (hyperlipidemia)   Depression with anxiety   Essential hypertension   GERD (gastroesophageal reflux disease)   Osteoporosis   COPD exacerbation (HCC)   Atrial fibrillation (HCC)   Chronic diastolic (congestive) heart failure (HCC)   Lobar pneumonia due to unspecified organism   Chronic obstructive pulmonary disease with acute exacerbation ([place])   HCAP (healthcare-associated pneumonia)   Acute on chronic respiratory failure (HCC) due to COPD exacerbation / HCAP  - CXR has persistent lingula infiltrate. This is chronic - CTA-chest on 01/21/15 did not have mass or PE.  - continue IV steroids, antibiotics, nebulizers - Added Brovana, continue to monitor respiratory status. - Pulmonary following, appreciate input. Patient with a CT scan of the chest without contrast yesterday, which showed persistent lingular infiltrate. Plan for bronchoscopy propyl  Essential hypertension - Cardizem, metoprolol -  On lasix - Controlled today at 118/57, no changes in the current regimen  GERD - Protonix  Depression and anxiety  - Stable, no suicidal or homicidal ideations. - Continue home medications: BuSpar, Prozac  PAF: CHA2DS2-VASc Score is 4, needs oral anticoagulation. Heart rate is well controlled. Patient refuses anticoagulation. Patient is currently preferring to be on aspirin - ASA and Cardizem - Rate controlled, telemetry reviewed 1/17, heart rates with adequate control   Chronic diastolic (congestive) heart failure ([place])  - 2-D echo on 03/07/14 showed EF 50-60%. Patient does not have leg edema, CHF is compensated. - Continue aspirin, metoprolol and home dose Lasix    Diet: Diet Heart Room service appropriate?: Yes; Fluid consistency:: Thin Diet NPO time specified Fluids: none DVT Prophylaxis: heparin  Code Status: Full Code Family Communication: no family bedside  Disposition Plan: home when ready   Barriers to discharge: IV therapies  Consultants:  Pulmonary   Procedures:  None    Antibiotics Vancomycin 1/14 >> Cefepime 1/14 >>   Studies  Ct Chest Wo Contrast  02/25/2015  CLINICAL DATA:  Progressive dyspnea with cough. EXAM: CT CHEST WITHOUT CONTRAST TECHNIQUE: Multidetector CT imaging of the chest was performed following the standard protocol without IV contrast. COMPARISON:  01/30/2015. FINDINGS: Mediastinum / Lymph Nodes: There is no axillary lymphadenopathy. 9 mm short axis precarinal lymph node is stable. No mediastinal lymphadenopathy. No definite hilar lymphadenopathy although assessment is limited by the lack of intravenous contrast material. The heart is enlarged. Coronary artery calcification is noted. The esophagus has normal imaging features. Lungs / Pleura: Emphysema noted bilaterally. Segmental opacification in the lingula persists without substantial change. The no evidence for pulmonary edema or pleural effusion. Upper Abdomen:  Calcified gallstones  measure up to 18 mm  diameter. MSK / Soft Tissues: Bone windows reveal no worrisome lytic or sclerotic osseous lesions. Compression deformity again noted at T5 and T6. IMPRESSION: Stable appearance of confluent consolidation in the lingula. Given the persistence, neoplasm be considered. PET-CT may prove helpful to further evaluate. Compression fractures T5 and T6. Cholelithiasis. Electronically Signed   By: Misty Stanley M.D.   On: 02/25/2015 13:39   Objective  Filed Vitals:   02/25/15 2139 02/26/15 0436 02/26/15 0849 02/26/15 0854  BP: 119/76 118/57    Pulse: 91 88    Temp: 98.2 F (36.8 C) 98.1 F (36.7 C)    TempSrc: Oral Oral    Resp: 18 18    Height:      Weight:  83.2 kg (183 lb 6.8 oz)    SpO2: 97% 97% 96% 96%    Intake/Output Summary (Last 24 hours) at 02/26/15 1117 Last data filed at 02/26/15 0753  Gross per 24 hour  Intake    470 ml  Output   3150 ml  Net  -2680 ml   Filed Weights   02/24/15 0552 02/25/15 0433 02/26/15 0436  Weight: 83.643 kg (184 lb 6.4 oz) 83.915 kg (185 lb) 83.2 kg (183 lb 6.8 oz)   Exam:  GENERAL: NAD  HEENT: no scleral icterus, PERRL  NECK: supple, no LAD  LUNGS: CTA biL, mild wheezing  HEART: RRR without MRG  ABDOMEN: soft, non tender  NEUROLOGIC: non focal   Data Reviewed: Basic Metabolic Panel:  Recent Labs Lab 02/23/15 1922 02/24/15 0524  NA 145 146*  K 3.7 4.4  CL 104 103  CO2 32 31  GLUCOSE 108* 155*  BUN 16 15  CREATININE 0.78 0.76  CALCIUM 8.6* 9.5   Liver Function Tests:  Recent Labs Lab 02/24/15 0524  AST 17  ALT 13*  ALKPHOS 98  BILITOT 0.7  PROT 6.6  ALBUMIN 3.5   CBC:  Recent Labs Lab 02/23/15 1922 02/24/15 0524  WBC 7.0 6.3  NEUTROABS 4.7  --   HGB 9.6* 10.8*  HCT 32.7* 37.4  MCV 96.2 99.5  PLT 206 212   BNP (last 3 results)  Recent Labs  01/30/15 0625 02/04/15 1142 02/24/15 0524  BNP 181.4* 217.6* 306.5*    Scheduled Meds: . arformoterol  15 mcg Nebulization BID  . aspirin   650 mg Oral Daily  . busPIRone  10 mg Oral TID  . ceFEPime (MAXIPIME) IV  1 g Intravenous Q8H  . dextromethorphan-guaiFENesin  1 tablet Oral BID  . diltiazem  120 mg Oral 3 times per day  . FLUoxetine  20 mg Oral Daily  . furosemide  40 mg Oral Daily  . heparin  5,000 Units Subcutaneous 3 times per day  . ipratropium-albuterol  3 mL Nebulization TID  . methylPREDNISolone (SOLU-MEDROL) injection  60 mg Intravenous Q12H  . metoprolol succinate  25 mg Oral Daily  . pantoprazole  40 mg Oral Daily  . sodium chloride  3 mL Intravenous Q12H  . tiotropium  18 mcg Inhalation Daily  . vancomycin  1,000 mg Intravenous Q12H   Continuous Infusions:   Marzetta Board, MD [facility] Pager [phone]. If 7 PM - 7 AM, please contact night-coverage at [email or web address], password [facility] 02/26/2015, 11:17 AM  LOS: 3 days

## 2015-02-27 ENCOUNTER — Encounter (HOSPITAL_COMMUNITY)
Admission: EM | Disposition: A | Discharge status: Home Health | Payer: Self-pay | Source: Home / Self Care | Attending: Internal Medicine

## 2015-02-27 ENCOUNTER — Inpatient Hospital Stay (HOSPITAL_COMMUNITY): Payer: Medicare Other

## 2015-02-27 DIAGNOSIS — I10 Essential (primary) hypertension: Secondary | ICD-10-CM

## 2015-02-27 HISTORY — PX: VIDEO BRONCHOSCOPY: SHX5072

## 2015-02-27 LAB — CBC
HEMATOCRIT: 38.9 % (ref 36.0–46.0)
HEMOGLOBIN: 11.2 g/dL — AB (ref 12.0–15.0)
MCH: 28.4 pg (ref 26.0–34.0)
MCHC: 28.8 g/dL — AB (ref 30.0–36.0)
MCV: 98.5 fL (ref 78.0–100.0)
Platelets: 242 10*3/uL (ref 150–400)
RBC: 3.95 MIL/uL (ref 3.87–5.11)
RDW: 15.2 % (ref 11.5–15.5)
WBC: 11.3 10*3/uL — ABNORMAL HIGH (ref 4.0–10.5)

## 2015-02-27 LAB — BASIC METABOLIC PANEL
ANION GAP: 10 (ref 5–15)
BUN: 26 mg/dL — AB (ref 6–20)
CHLORIDE: 100 mmol/L — AB (ref 101–111)
CO2: 32 mmol/L (ref 22–32)
Calcium: 8.8 mg/dL — ABNORMAL LOW (ref 8.9–10.3)
Creatinine, Ser: 0.75 mg/dL (ref 0.44–1.00)
GFR calc Af Amer: >60 mL/min (ref >60)
GFR calc non Af Amer: >60 mL/min (ref >60)
GLUCOSE: 128 mg/dL — AB (ref 65–99)
POTASSIUM: 4.4 mmol/L (ref 3.5–5.1)
Sodium: 142 mmol/L (ref 135–145)

## 2015-02-27 LAB — MRSA PCR SCREENING: MRSA by PCR: NEGATIVE

## 2015-02-27 SURGERY — BRONCHOSCOPY, WITH FLUOROSCOPY
Anesthesia: Moderate Sedation | Laterality: Bilateral

## 2015-02-27 MED ORDER — SODIUM CHLORIDE 0.9 % IV SOLN
INTRAVENOUS | Status: DC
  Administered 2015-02-27: 08:00:00 via INTRAVENOUS

## 2015-02-27 MED ORDER — FENTANYL CITRATE (PF) 100 MCG/2ML IJ SOLN
INTRAMUSCULAR | Status: AC
  Filled 2015-02-27: qty 4

## 2015-02-27 MED ORDER — MIDAZOLAM HCL 5 MG/ML IJ SOLN
INTRAMUSCULAR | Status: AC
  Filled 2015-02-27: qty 2

## 2015-02-27 MED ORDER — MIDAZOLAM HCL 10 MG/2ML IJ SOLN
INTRAMUSCULAR | Status: DC | PRN
  Administered 2015-02-27: 4 mg via INTRAVENOUS
  Administered 2015-02-27: 1 mg via INTRAVENOUS

## 2015-02-27 MED ORDER — LIDOCAINE HCL 1 % IJ SOLN
INTRAMUSCULAR | Status: DC | PRN
  Administered 2015-02-27: 6 mL via RESPIRATORY_TRACT

## 2015-02-27 MED ORDER — FENTANYL CITRATE (PF) 100 MCG/2ML IJ SOLN
INTRAMUSCULAR | Status: DC | PRN
Start: 2015-02-27 — End: 2015-02-27
  Administered 2015-02-27: 100 ug via INTRAVENOUS

## 2015-02-27 NOTE — Progress Notes (Signed)
OT Cancellation Note  Patient Details Name: Kelsey Garcia MRN: [id number] DOB: 09/02/1945   Cancelled Treatment:    Reason Eval/Treat Not Completed: Patient at procedure or test/ unavailable  Draya Felker 02/27/2015, 8:13 AM  Lesle Chris, OTR/L [phone] 02/27/2015

## 2015-02-27 NOTE — Progress Notes (Signed)
[facility] PROGRESS NOTE  Kelsey Garcia YSA:[id number] DOB: 04/29/1945 DOA: 02/23/2015 PCP: Darden Amber, PA  HPI/Brief narrative 70 y.o. female with PMH of COPD on 2 L oxygen at home, atrial fibrillation (patient refused anticoagulant), hyperlipidemia, GERD, depression, anxiety, mitral regurgitation, diastolic congestive heart failure, DJD, OCD, who presents with productive cough, shortness of breath and wheezing. Recently hospitalized several times last year for same.   Assessment/Plan: Acute on chronic respiratory failure ([place]) due to COPD exacerbation / HCAP  - CXR has persistent lingula infiltrate, likley chronic - CTA-chest on 01/21/15 without mass or PE.  - continued on IV steroids, antibiotics, nebulizers - Added Brovana, continue to monitor respiratory status. - Pulmonary following, appreciate input.  - Patient underwent bronchoscopy on 1/18  Essential hypertension - Cont cardizem, metoprolol - On lasix - Controlled currently, no changes in the current regimen  GERD - Protonix  Depression and anxiety  - Stable, no suicidal or homicidal ideations. - Continue home medications: BuSpar, Prozac  PAF: CHA2DS2-VASc Score is 4, needs oral anticoagulation. Heart rate is well controlled. Patient refuses anticoagulation. Patient is currently preferring to be on aspirin - ASA and Cardizem - Rate controlled, telemetry reviewed 1/17, heart rates with adequate control   Chronic diastolic (congestive) heart failure ([place])  - 2-D echo on 03/07/14 showed EF 50-60%. Patient does not have leg edema, CHF is compensated. - Continue aspirin, metoprolol and home dose Lasix  Code Status: Full Family Communication: Pt in room Disposition Plan: Transferred to SDU post-bronch   Consultants:  Pulmonary  Procedures:  Bronchoscopy 1/18  Antibiotics: Anti-infectives    Start     Dose/Rate Route Frequency Ordered Stop   02/24/15 1200  vancomycin (VANCOCIN) IVPB 1000 mg/200 mL  premix     1,000 mg 200 mL/hr over 60 Minutes Intravenous Every 12 hours 02/24/15 0004     02/23/15 2359  ceFEPIme (MAXIPIME) 1 g in dextrose 5 % 50 mL IVPB     1 g 100 mL/hr over 30 Minutes Intravenous Every 8 hours 02/23/15 2308     02/23/15 2245  vancomycin (VANCOCIN) IVPB 1000 mg/200 mL premix     1,000 mg 200 mL/hr over 60 Minutes Intravenous  Once 02/23/15 2243 02/24/15 0039      HPI/Subjective: Feels confused post-procedure this AM after sedation  Objective: Filed Vitals:   02/27/15 1426 02/27/15 1514 02/27/15 1630 02/27/15 1643  BP: 120/72 120/72 100/44   Pulse:  87    Temp:    98 F (36.7 C)  TempSrc:    Oral  Resp:  16    Height:      Weight:      SpO2:  95%      Intake/Output Summary (Last 24 hours) at 02/27/15 1732 Last data filed at 02/27/15 1700  Gross per 24 hour  Intake   1030 ml  Output   1800 ml  Net   -770 ml   Filed Weights   02/26/15 0436 02/27/15 0602 02/27/15 1030  Weight: 83.2 kg (183 lb 6.8 oz) 82.3 kg (181 lb 7 oz) 85.3 kg (188 lb 0.8 oz)    Exam:   General:  Awake, in nad  Cardiovascular: regular, s1, s2  Respiratory: normal resp effort, decreased breathsounds throughout  Abdomen: soft,nondistended  Musculoskeletal: perfused, no clubbing   Data Reviewed: Basic Metabolic Panel:  Recent Labs Lab 02/23/15 1922 02/24/15 0524 02/26/15 1055 02/27/15 0650  NA 145 146*  --  142  K 3.7 4.4  --  4.4  CL  104 103  --  100*  CO2 32 31  --  32  GLUCOSE 108* 155*  --  128*  BUN 16 15  --  26*  CREATININE 0.78 0.76 0.74 0.75  CALCIUM 8.6* 9.5  --  8.8*   Liver Function Tests:  Recent Labs Lab 02/24/15 0524  AST 17  ALT 13*  ALKPHOS 98  BILITOT 0.7  PROT 6.6  ALBUMIN 3.5   No results for input(s): LIPASE, AMYLASE in the last 168 hours. No results for input(s): AMMONIA in the last 168 hours. CBC:  Recent Labs Lab 02/23/15 1922 02/24/15 0524 02/27/15 0650  WBC 7.0 6.3 11.3*  NEUTROABS 4.7  --   --   HGB 9.6* 10.8*  11.2*  HCT 32.7* 37.4 38.9  MCV 96.2 99.5 98.5  PLT 206 212 242   Cardiac Enzymes: No results for input(s): CKTOTAL, CKMB, CKMBINDEX, TROPONINI in the last 168 hours. BNP (last 3 results)  Recent Labs  01/30/15 0625 02/04/15 1142 02/24/15 0524  BNP 181.4* 217.6* 306.5*    ProBNP (last 3 results) No results for input(s): PROBNP in the last 8760 hours.  CBG: No results for input(s): GLUCAP in the last 168 hours.  Recent Results (from the past 240 hour(s))  Culture, expectorated sputum-assessment     Status: None   Collection Time: 02/26/15  1:00 AM  Result Value Ref Range Status   Specimen Description SPUTUM  Final   Special Requests NONE  Final   Sputum evaluation   Final    THIS SPECIMEN IS ACCEPTABLE. RESPIRATORY CULTURE REPORT TO FOLLOW.   Report Status 02/26/2015 FINAL  Final  Culture, respiratory (NON-Expectorated)     Status: None (Preliminary result)   Collection Time: 02/26/15  1:00 AM  Result Value Ref Range Status   Specimen Description SPU  Final   Special Requests NONE  Final   Gram Stain   Final    ABUNDANT WBC PRESENT, PREDOMINANTLY PMN RARE SQUAMOUS EPITHELIAL CELLS PRESENT FEW YEAST Performed at [facility]    Culture   Final    Culture reincubated for better growth Performed at [facility]    Report Status PENDING  Incomplete  MRSA PCR Screening     Status: None   Collection Time: 02/27/15 11:15 AM  Result Value Ref Range Status   MRSA by PCR NEGATIVE NEGATIVE Final    Comment:        The GeneXpert MRSA Assay (FDA approved for NASAL specimens only), is one component of a comprehensive MRSA colonization surveillance program. It is not intended to diagnose MRSA infection nor to guide or monitor treatment for MRSA infections.      Studies: Dg Chest Port 1 View  02/27/2015  CLINICAL DATA:  Status post bronchoscopy EXAM: PORTABLE CHEST 1 VIEW COMPARISON:  Chest radiograph February 23, 2015; chest CT February 25 2015  FINDINGS: There is no apparent pneumothorax. The ill-defined opacity in the superior lingula remains. There there is slight generalized interstitial edema. Heart is borderline enlarged with mild pulmonary venous hypertension. No adenopathy apparent. IMPRESSION: Persistent ill-defined opacity superior lingula. Evidence of a mild degree of congestive heart failure. No pneumothorax. Electronically Signed   By: Lowella Grip III M.D.   On: 02/27/2015 09:37   Dg C-arm Bronchoscopy  02/27/2015  CLINICAL DATA:  C-ARM BRONCHOSCOPY Fluoroscopy was utilized by the requesting physician.  No radiographic interpretation.    Scheduled Meds: . arformoterol  15 mcg Nebulization BID  . aspirin  650 mg Oral  Daily  . busPIRone  10 mg Oral TID  . ceFEPime (MAXIPIME) IV  1 g Intravenous Q8H  . dextromethorphan-guaiFENesin  1 tablet Oral BID  . diltiazem  120 mg Oral 3 times per day  . FLUoxetine  20 mg Oral Daily  . furosemide  40 mg Oral Daily  . heparin  5,000 Units Subcutaneous 3 times per day  . ipratropium-albuterol  3 mL Nebulization TID  . methylPREDNISolone (SOLU-MEDROL) injection  60 mg Intravenous Q12H  . metoprolol succinate  25 mg Oral Daily  . pantoprazole  40 mg Oral Daily  . sodium chloride  3 mL Intravenous Q12H  . tiotropium  18 mcg Inhalation Daily  . vancomycin  1,000 mg Intravenous Q12H   Continuous Infusions: . sodium chloride 10 mL/hr at 02/27/15 0803    Principal Problem:   Acute on chronic respiratory failure (HCC) Active Problems:   HLD (hyperlipidemia)   Depression with anxiety   Essential hypertension   GERD (gastroesophageal reflux disease)   Osteoporosis   COPD exacerbation (HCC)   Atrial fibrillation (HCC)   Chronic diastolic (congestive) heart failure (HCC)   Lobar pneumonia due to unspecified organism   Chronic obstructive pulmonary disease with acute exacerbation ([place])   HCAP (healthcare-associated pneumonia)   Lingular pneumonia   Miran Kautzman, [place]  [facility] Pager [phone]. If 7PM-7AM, please contact night-coverage at [email or web address], password [facility] 02/27/2015, 5:32 PM  LOS: 4 days

## 2015-02-27 NOTE — Procedures (Addendum)
Bronchoscopy Procedure Note SABINA BEAVERS [id number] 24-Oct-1945  Procedure: Bronchoscopy Indications: 70 yo female with persistent lingular infiltrate  Procedure Details Consent: Risks of procedure as well as the alternatives and risks of each were explained to the (patient/caregiver).  Consent for procedure obtained. Time Out: Verified patient identification, verified procedure, site/side was marked, verified correct patient position, special equipment/implants available, medications/allergies/relevent history reviewed, required imaging and test results available.  Performed  She was brought to bronchoscopy suite.  Cetacaine spray was applied to posterior pharynx.  She was given total of 5 mg versed and 100 mcg fentanyl.  Conscious sedation time from 840 AM to 9 AM.  She had difficulty with oxygenation during the procedure.  This improved with jaw thrust, and non rebreather mask.  Bronchoscope entered orally.  The vocal cords were visualized and had normal movement.  Total of 6 cc of 1% lidocaine instilled for anesthesia of airways.  Bronchoscope entered into trach and carina visualized.  Then entered right main bronchus.  Right upper, middle, and lower lobe sites visualized.  No endobronchial lesions.  The bronchoscope was then entered into the left main bronchus.  The left upper, lingular, and lower lobes were visualized.  There was white, raised nodules eminating from the lingular.  The mucosa in this area was very friable, and easily bled with any contact from bronchoscope.  The bronchoscope was wedged into the lingular lobe.  Instilled 60 ml of saline with 15 ml of cloudy, white fluid returned.  Then performed brushing in lingula.  This was followed by transbronchial biopsy from lingula x 2 passes, using fluoroscopic guidance.  Total fluoroscopy time was 20 seconds.  She had elevation in her end tidal CO2 during procedure.  Plan Will monitor patient in step down unit with BiPAP as  needed, and adjust oxygen to keep SpO2 88 to 95%. Post-procedure chest xray is pending. Will send transbronchial biopsy from lingula for surgical pathology. Will send BAL from lingula for cytology, gram stain/culture, AFB smear and culture, and Fungal smear and culture. Will send brushing from lingula for cytology.  Chesley Mires, MD [facility] Pulmonary/Critical Care 02/27/2015, 9:35 AM Pager:  [phone] After 3pm call: [phone]

## 2015-02-27 NOTE — Progress Notes (Signed)
Video bronchoscopy done Intervention bronchial washing Interventional bronchial brushing Interventional bronchial biospy

## 2015-02-28 ENCOUNTER — Inpatient Hospital Stay (HOSPITAL_COMMUNITY): Payer: Medicare Other

## 2015-02-28 ENCOUNTER — Encounter (HOSPITAL_COMMUNITY): Payer: Self-pay | Admitting: Pulmonary Disease

## 2015-02-28 DIAGNOSIS — I480 Paroxysmal atrial fibrillation: Secondary | ICD-10-CM

## 2015-02-28 LAB — BASIC METABOLIC PANEL
Anion gap: 8 (ref 5–15)
BUN: 32 mg/dL — ABNORMAL HIGH (ref 6–20)
CHLORIDE: 103 mmol/L (ref 101–111)
CO2: 32 mmol/L (ref 22–32)
CREATININE: 0.73 mg/dL (ref 0.44–1.00)
Calcium: 8.9 mg/dL (ref 8.9–10.3)
GFR calc non Af Amer: >60 mL/min (ref >60)
Glucose, Bld: 218 mg/dL — ABNORMAL HIGH (ref 65–99)
POTASSIUM: 4.5 mmol/L (ref 3.5–5.1)
SODIUM: 143 mmol/L (ref 135–145)

## 2015-02-28 LAB — CBC
HEMATOCRIT: 35.7 % — AB (ref 36.0–46.0)
Hemoglobin: 10.5 g/dL — ABNORMAL LOW (ref 12.0–15.0)
MCH: 29 pg (ref 26.0–34.0)
MCHC: 29.4 g/dL — ABNORMAL LOW (ref 30.0–36.0)
MCV: 98.6 fL (ref 78.0–100.0)
PLATELETS: 207 10*3/uL (ref 150–400)
RBC: 3.62 MIL/uL — AB (ref 3.87–5.11)
RDW: 15.2 % (ref 11.5–15.5)
WBC: 8.9 10*3/uL (ref 4.0–10.5)

## 2015-02-28 LAB — CULTURE, RESPIRATORY W GRAM STAIN: Culture: NORMAL

## 2015-02-28 LAB — CULTURE, RESPIRATORY

## 2015-02-28 MED ORDER — IOHEXOL 300 MG/ML  SOLN
100.0000 mL | Freq: Once | INTRAMUSCULAR | Status: AC | PRN
  Administered 2015-02-28: 100 mL via INTRAVENOUS

## 2015-02-28 MED ORDER — IOHEXOL 300 MG/ML  SOLN
50.0000 mL | Freq: Once | INTRAMUSCULAR | Status: AC | PRN
  Administered 2015-02-28: 50 mL via ORAL

## 2015-02-28 MED ORDER — GADOBENATE DIMEGLUMINE 529 MG/ML IV SOLN
15.0000 mL | Freq: Once | INTRAVENOUS | Status: AC | PRN
  Administered 2015-02-28: 15 mL via INTRAVENOUS

## 2015-02-28 MED ORDER — ALPRAZOLAM 0.5 MG PO TABS
0.5000 mg | ORAL_TABLET | Freq: Three times a day (TID) | ORAL | Status: DC | PRN
Start: 2015-02-28 — End: 2015-03-02
  Administered 2015-02-28 – 2015-03-01 (×2): 0.5 mg via ORAL
  Filled 2015-02-28 (×2): qty 1

## 2015-02-28 MED ORDER — ALBUTEROL SULFATE (2.5 MG/3ML) 0.083% IN NEBU
2.5000 mg | INHALATION_SOLUTION | Freq: Three times a day (TID) | RESPIRATORY_TRACT | Status: DC
  Administered 2015-02-28 (×2): 2.5 mg via RESPIRATORY_TRACT
  Filled 2015-02-28 (×2): qty 3

## 2015-02-28 NOTE — Progress Notes (Signed)
[facility] PROGRESS NOTE  Kelsey Garcia HQP:[id number] DOB: 11-10-1945 DOA: 02/23/2015 PCP: Darden Amber, PA  HPI/Brief narrative 70 y.o. female with PMH of COPD on 2 L oxygen at home, atrial fibrillation (patient refused anticoagulant), hyperlipidemia, GERD, depression, anxiety, mitral regurgitation, diastolic congestive heart failure, DJD, OCD, who presents with productive cough, shortness of breath and wheezing. Recently hospitalized several times last year for same.   Assessment/Plan: Acute on chronic respiratory failure ([place]) due to COPD exacerbation / HCAP  - CXR has persistent lingula infiltrate, likley chronic - CTA-chest on 01/21/15 without mass or PE.  - continued on IV steroids, antibiotics, nebulizers - Added Brovana, continue to monitor respiratory status. - Pulmonary following, appreciate input.  - Patient underwent bronchoscopy on 1/18, await biopsy results  Essential hypertension - Cont cardizem, metoprolol - On lasix - Controlled currently, no changes in the current regimen  GERD - Protonix  Depression and anxiety  - Stable, no suicidal or homicidal ideations. - Continue home medications: BuSpar, Prozac  PAF: CHA2DS2-VASc Score is 4, needs oral anticoagulation. Heart rate is well controlled. Patient refuses anticoagulation. Patient is currently preferring to be on aspirin - ASA and Cardizem - Rate controlled, telemetry reviewed 1/17, heart rates with adequate control   Chronic diastolic (congestive) heart failure ([place])  - 2-D echo on 03/07/14 showed EF 50-60%. Patient does not have leg edema, CHF is compensated. - Continue aspirin, metoprolol and home dose Lasix  Code Status: Full Family Communication: Pt in room Disposition Plan: Transfer to floor   Consultants:  Pulmonary  Procedures:  Bronchoscopy 1/18  Antibiotics: Anti-infectives    Start     Dose/Rate Route Frequency Ordered Stop   02/24/15 1200  vancomycin (VANCOCIN) IVPB 1000  mg/200 mL premix     1,000 mg 200 mL/hr over 60 Minutes Intravenous Every 12 hours 02/24/15 0004     02/23/15 2359  ceFEPIme (MAXIPIME) 1 g in dextrose 5 % 50 mL IVPB     1 g 100 mL/hr over 30 Minutes Intravenous Every 8 hours 02/23/15 2308     02/23/15 2245  vancomycin (VANCOCIN) IVPB 1000 mg/200 mL premix     1,000 mg 200 mL/hr over 60 Minutes Intravenous  Once 02/23/15 2243 02/24/15 0039      HPI/Subjective: Feels better today. Reports breathing better  Objective: Filed Vitals:   02/28/15 1200 02/28/15 1207 02/28/15 1300 02/28/15 1433  BP:    121/65  Pulse:      Temp:  98 F (36.7 C)    TempSrc:  Oral    Resp:    15  Height:      Weight:      SpO2: 97%  97% 95%    Intake/Output Summary (Last 24 hours) at 02/28/15 1457 Last data filed at 02/28/15 1425  Gross per 24 hour  Intake   1720 ml  Output   1850 ml  Net   -130 ml   Filed Weights   02/26/15 0436 02/27/15 0602 02/27/15 1030  Weight: 83.2 kg (183 lb 6.8 oz) 82.3 kg (181 lb 7 oz) 85.3 kg (188 lb 0.8 oz)    Exam:   General:  Awake, in nad, sitting in bed  Cardiovascular: regular, s1, s2  Respiratory: normal resp effort, decreased breathsounds throughout  Abdomen: soft,nondistended, pos BS  Musculoskeletal: perfused, no clubbing, no cyanosis  Data Reviewed: Basic Metabolic Panel:  Recent Labs Lab 02/23/15 1922 02/24/15 0524 02/26/15 1055 02/27/15 0650 02/28/15 0337  NA 145 146*  --  142 143  K 3.7 4.4  --  4.4 4.5  CL 104 103  --  100* 103  CO2 32 31  --  32 32  GLUCOSE 108* 155*  --  128* 218*  BUN 16 15  --  26* 32*  CREATININE 0.78 0.76 0.74 0.75 0.73  CALCIUM 8.6* 9.5  --  8.8* 8.9   Liver Function Tests:  Recent Labs Lab 02/24/15 0524  AST 17  ALT 13*  ALKPHOS 98  BILITOT 0.7  PROT 6.6  ALBUMIN 3.5   No results for input(s): LIPASE, AMYLASE in the last 168 hours. No results for input(s): AMMONIA in the last 168 hours. CBC:  Recent Labs Lab 02/23/15 1922 02/24/15 0524  02/27/15 0650 02/28/15 0337  WBC 7.0 6.3 11.3* 8.9  NEUTROABS 4.7  --   --   --   HGB 9.6* 10.8* 11.2* 10.5*  HCT 32.7* 37.4 38.9 35.7*  MCV 96.2 99.5 98.5 98.6  PLT 206 212 242 207   Cardiac Enzymes: No results for input(s): CKTOTAL, CKMB, CKMBINDEX, TROPONINI in the last 168 hours. BNP (last 3 results)  Recent Labs  01/30/15 0625 02/04/15 1142 02/24/15 0524  BNP 181.4* 217.6* 306.5*    ProBNP (last 3 results) No results for input(s): PROBNP in the last 8760 hours.  CBG: No results for input(s): GLUCAP in the last 168 hours.  Recent Results (from the past 240 hour(s))  Culture, expectorated sputum-assessment     Status: None   Collection Time: 02/26/15  1:00 AM  Result Value Ref Range Status   Specimen Description SPUTUM  Final   Special Requests NONE  Final   Sputum evaluation   Final    THIS SPECIMEN IS ACCEPTABLE. RESPIRATORY CULTURE REPORT TO FOLLOW.   Report Status 02/26/2015 FINAL  Final  Culture, respiratory (NON-Expectorated)     Status: None   Collection Time: 02/26/15  1:00 AM  Result Value Ref Range Status   Specimen Description SPU  Final   Special Requests NONE  Final   Gram Stain   Final    ABUNDANT WBC PRESENT, PREDOMINANTLY PMN RARE SQUAMOUS EPITHELIAL CELLS PRESENT FEW YEAST Performed at [facility]    Culture   Final    NORMAL OROPHARYNGEAL FLORA Performed at [facility]    Report Status 02/28/2015 FINAL  Final  Culture, bal-quantitative     Status: None (Preliminary result)   Collection Time: 02/27/15  8:58 AM  Result Value Ref Range Status   Specimen Description BRONCHIAL ALVEOLAR LAVAGE  Final   Special Requests NONE  Final   Gram Stain   Final    ABUNDANT WBC PRESENT,BOTH PMN AND MONONUCLEAR MODERATE SQUAMOUS EPITHELIAL CELLS PRESENT RARE YEAST RARE GRAM POSITIVE COCCI IN PAIRS    Culture   Final    Culture reincubated for better growth Performed at [facility]    Report Status PENDING  Incomplete   Fungus Culture with Smear     Status: None (Preliminary result)   Collection Time: 02/27/15  8:58 AM  Result Value Ref Range Status   Specimen Description BRONCHIAL ALVEOLAR LAVAGE  Final   Special Requests NONE  Final   Fungal Smear RARE YEAST Performed at [facility]   Final   Culture   Final    CULTURE IN PROGRESS FOR FOUR WEEKS Performed at [facility]    Report Status PENDING  Incomplete  MRSA PCR Screening     Status: None   Collection Time: 02/27/15 11:15 AM  Result Value Ref Range Status   MRSA by PCR NEGATIVE NEGATIVE Final    Comment:        The GeneXpert MRSA Assay (FDA approved for NASAL specimens only), is one component of a comprehensive MRSA colonization surveillance program. It is not intended to diagnose MRSA infection nor to guide or monitor treatment for MRSA infections.      Studies: Dg Chest Port 1 View  02/27/2015  CLINICAL DATA:  Status post bronchoscopy EXAM: PORTABLE CHEST 1 VIEW COMPARISON:  Chest radiograph February 23, 2015; chest CT February 25 2015 FINDINGS: There is no apparent pneumothorax. The ill-defined opacity in the superior lingula remains. There there is slight generalized interstitial edema. Heart is borderline enlarged with mild pulmonary venous hypertension. No adenopathy apparent. IMPRESSION: Persistent ill-defined opacity superior lingula. Evidence of a mild degree of congestive heart failure. No pneumothorax. Electronically Signed   By: Lowella Grip III M.D.   On: 02/27/2015 09:37   Dg C-arm Bronchoscopy  02/27/2015  CLINICAL DATA:  C-ARM BRONCHOSCOPY Fluoroscopy was utilized by the requesting physician.  No radiographic interpretation.    Scheduled Meds: . albuterol  2.5 mg Nebulization TID  . arformoterol  15 mcg Nebulization BID  . aspirin  650 mg Oral Daily  . busPIRone  10 mg Oral TID  . ceFEPime (MAXIPIME) IV  1 g Intravenous Q8H  . dextromethorphan-guaiFENesin  1 tablet Oral BID  . diltiazem  120  mg Oral 3 times per day  . FLUoxetine  20 mg Oral Daily  . furosemide  40 mg Oral Daily  . heparin  5,000 Units Subcutaneous 3 times per day  . methylPREDNISolone (SOLU-MEDROL) injection  60 mg Intravenous Q12H  . metoprolol succinate  25 mg Oral Daily  . pantoprazole  40 mg Oral Daily  . sodium chloride  3 mL Intravenous Q12H  . tiotropium  18 mcg Inhalation Daily  . vancomycin  1,000 mg Intravenous Q12H   Continuous Infusions: . sodium chloride 10 mL/hr at 02/28/15 1000    Principal Problem:   Acute on chronic respiratory failure (HCC) Active Problems:   HLD (hyperlipidemia)   Depression with anxiety   Essential hypertension   GERD (gastroesophageal reflux disease)   Osteoporosis   COPD exacerbation (HCC)   Atrial fibrillation (HCC)   Chronic diastolic (congestive) heart failure (HCC)   Lobar pneumonia due to unspecified organism   Chronic obstructive pulmonary disease with acute exacerbation ([place])   HCAP (healthcare-associated pneumonia)   Lingular pneumonia   CHIU, STEPHEN K  [facility] Pager [phone]. If 7PM-7AM, please contact night-coverage at [email or web address], password [facility] 02/28/2015, 2:57 PM  LOS: 5 days

## 2015-02-28 NOTE — Progress Notes (Signed)
PCCM PROGRESS NOTE  Date of Admission: 02/23/2015 Date of Consult: 02/24/2015 Referring Provider: Dr. Blaine Hamper, [facility]  Chief Complaint: Short of breath  Subjective:   Not as anxious.  Breathing still heavy, but not as bad.  Vital Signs: BP 150/82 mmHg  Pulse 79  Temp(Src) 98 F (36.7 C) (Oral)  Resp 18  Ht '5\' 5"'$  (1.651 m)  Wt 188 lb 0.8 oz (85.3 kg)  BMI 31.29 kg/m2  SpO2 97%  General: pleasant, no distress Neuro: alert, normal strength HEENT: no sinus tenderness Cardiac: regular Chest: no wheeze Abd: soft, non tender Ext: no edema  CMP Latest Ref Rng 02/28/2015 02/27/2015 02/26/2015  Glucose 65 - 99 mg/dL 218(H) 128(H) -  BUN 6 - 20 mg/dL 32(H) 26(H) -  Creatinine 0.44 - 1.00 mg/dL 0.73 0.75 0.74  Sodium 135 - 145 mmol/L 143 142 -  Potassium 3.5 - 5.1 mmol/L 4.5 4.4 -  Chloride 101 - 111 mmol/L 103 100(L) -  CO2 22 - 32 mmol/L 32 32 -  Calcium 8.9 - 10.3 mg/dL 8.9 8.8(L) -  Total Protein 6.5 - 8.1 g/dL - - -  Total Bilirubin 0.3 - 1.2 mg/dL - - -  Alkaline Phos 38 - 126 U/L - - -  AST 15 - 41 U/L - - -  ALT 14 - 54 U/L - - -    CBC Latest Ref Rng 02/28/2015 02/27/2015 02/24/2015  WBC 4.0 - 10.5 K/uL 8.9 11.3(H) 6.3  Hemoglobin 12.0 - 15.0 g/dL 10.5(L) 11.2(L) 10.8(L)  Hematocrit 36.0 - 46.0 % 35.7(L) 38.9 37.4  Platelets 150 - 400 K/uL 207 242 212    Dg Chest Port 1 View  02/27/2015  CLINICAL DATA:  Status post bronchoscopy EXAM: PORTABLE CHEST 1 VIEW COMPARISON:  Chest radiograph February 23, 2015; chest CT February 25 2015 FINDINGS: There is no apparent pneumothorax. The ill-defined opacity in the superior lingula remains. There there is slight generalized interstitial edema. Heart is borderline enlarged with mild pulmonary venous hypertension. No adenopathy apparent. IMPRESSION: Persistent ill-defined opacity superior lingula. Evidence of a mild degree of congestive heart failure. No pneumothorax. Electronically Signed   By: Lowella Grip III M.D.   On: 02/27/2015 09:37    Dg C-arm Bronchoscopy  02/27/2015  CLINICAL DATA:  C-ARM BRONCHOSCOPY Fluoroscopy was utilized by the requesting physician.  No radiographic interpretation.   Cultures: 1/17 Sputum >> few yeast >> 1/18 BAL >> 1/18 BAL AFB >> 1/18 BAL Fungal >>  Antibiotics: 1/14 Vancomycin >> 1/14 Cefepime >>  Studies: 1/16 CT chest >> segmental opacification of lingula, emphysema 1/18 Bronchoscopy >>   Events: 1/14 Admit 1/18 Bronchoscopy >> hypoxia/hypercapnia post procedure >> to SDU 1/19 To floor   Discussion: 70 y/o female former smoker was in hospital in December 2016 with pneumonia, presented with progressive dyspnea, cough, and sputum.  She has persistent/progressive lingular infiltrate.    She is followed by [name] for COPD and chronic hypoxic respiratory failure.  She also has hx of chronic diastolic CHF, A fib, HLD, HTN, non ischemic CM, GERD, OCD.  Assessment/Plan:  COPD exacerbation. - continue brovana, spiriva - continue solumedrol - prn BDs - Abx per primary team - bronchial hygiene  Acute on chronic respiratory failure with hypoxia. - adjust oxygen to keep SpO2 90 to 95%  Persistent lingular infiltrate. - f/u bronchoscopy results from 1/18 - abx per primary team   Chesley Mires, MD [place] 02/28/2015, 8:29 AM Pager:  [phone] After 3pm call: [phone]

## 2015-02-28 NOTE — Treatment Plan (Signed)
Discussed case with Dr. Avis Epley, pathologist regarding biopsy/washing results. 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 CT chest w/o contrast already done this admit with CT angio done recently one month prior. Will order CT abd/pelvis and MRI brain for metastatic survey. Updated patient on findings.

## 2015-02-28 NOTE — Plan of Care (Signed)
Problem: Tissue Perfusion: Goal: Risk factors for ineffective tissue perfusion will decrease Outcome: Completed/Met Date Met:  02/28/15 Risk factors for VTE assessed.  SQ heparin ordered as prophylaxis.     

## 2015-03-01 ENCOUNTER — Encounter (HOSPITAL_COMMUNITY): Payer: Self-pay | Admitting: Hematology and Oncology

## 2015-03-01 DIAGNOSIS — I428 Other cardiomyopathies: Secondary | ICD-10-CM

## 2015-03-01 DIAGNOSIS — I4891 Unspecified atrial fibrillation: Secondary | ICD-10-CM

## 2015-03-01 DIAGNOSIS — Z9889 Other specified postprocedural states: Secondary | ICD-10-CM | POA: Insufficient documentation

## 2015-03-01 DIAGNOSIS — C3492 Malignant neoplasm of unspecified part of left bronchus or lung: Secondary | ICD-10-CM

## 2015-03-01 DIAGNOSIS — C3490 Malignant neoplasm of unspecified part of unspecified bronchus or lung: Secondary | ICD-10-CM

## 2015-03-01 DIAGNOSIS — Z87891 Personal history of nicotine dependence: Secondary | ICD-10-CM

## 2015-03-01 LAB — CULTURE, BAL-QUANTITATIVE W GRAM STAIN
Colony Count: 80000
Culture: NORMAL

## 2015-03-01 LAB — CULTURE, BAL-QUANTITATIVE

## 2015-03-01 MED ORDER — METHYLPREDNISOLONE SODIUM SUCC 40 MG IJ SOLR
40.0000 mg | Freq: Two times a day (BID) | INTRAMUSCULAR | Status: DC
  Administered 2015-03-01 – 2015-03-05 (×9): 40 mg via INTRAVENOUS
  Filled 2015-03-01 (×9): qty 1

## 2015-03-01 NOTE — Progress Notes (Signed)
[id number]/MCEYEM Kelsey Garcia,BSN,CCM,RN  [phone]: Chart reviewed for discharge planning and needs

## 2015-03-01 NOTE — Progress Notes (Signed)
Occupational Therapy Treatment Patient Details Name: Kelsey Garcia MRN: [id number] DOB: October 05, 1945 Today's Date: 03/01/2015    History of present illness 70 y.o. female with PMH of COPD on 2 L oxygen at home, atrial fibrillation (patient refused anticoagulant), hyperlipidemia, GERD, depression, anxiety, mitral regurgitation, diastolic congestive heart failure, DJD, OCD, who presents with productive cough, shortness of breath and wheezing. Recently hospitalized several times last year for same.    OT comments  Pt fatiqued; educated on energy conservation and she reports that she performs several of these techniques.  Follow Up Recommendations  No OT follow up;Supervision - Intermittent    Equipment Recommendations  None recommended by OT    Recommendations for Other Services      Precautions / Restrictions Precautions Precautions: Fall Precaution Comments: O2/ monitor sats       Mobility Bed Mobility Overal bed mobility: Modified Independent                Transfers   Equipment used: None Transfers: Stand Pivot Transfers   Stand pivot transfers: Min guard       General transfer comment: for lines/safety, BSC to bed    Balance                                   ADL                                         General ADL Comments: educated on energy conservation and provided handout. Pt verbalizes that she does many of these things. She does say that she tends to push through walking to destination.  Talked about taking a standing rest break against the wall if needed      Vision                     Perception     Praxis      Cognition   Behavior During Therapy: [facility] for tasks assessed/performed Overall Cognitive Status: Within Functional Limits for tasks assessed                       Extremity/Trunk Assessment               Exercises     Shoulder Instructions       General Comments       Pertinent Vitals/ Pain       Pain Score: 6  Pain Location: all over Pain Descriptors / Indicators: Constant Pain Intervention(s): Limited activity within patient's tolerance;Monitored during session;Repositioned  Home Living                                          Prior Functioning/Environment              Frequency Min 2X/week     Progress Toward Goals  OT Goals(current goals can now be found in the care plan section)  Progress towards OT goals: Progressing toward goals     Plan      Co-evaluation                 End of Session     Activity Tolerance Patient tolerated treatment well   Patient Left  in bed;with call bell/phone within reach;with bed alarm set   Nurse Communication          Time: [phone] OT Time Calculation (min): 20 min  Charges: OT General Charges $OT Visit: 1 Procedure OT Treatments $Therapeutic Activity: 8-22 mins  Jeanette Moffatt 03/01/2015, 1:49 PM Lesle Chris, OTR/L [phone] 03/01/2015

## 2015-03-01 NOTE — Progress Notes (Signed)
[facility] PROGRESS NOTE  CASSIDEY BARRALES BJS:[id number] DOB: 1946-01-02 DOA: 02/23/2015 PCP: Darden Amber, PA  HPI/Brief narrative 70 y.o. female with PMH of COPD on 2 L oxygen at home, atrial fibrillation (patient refused anticoagulant), hyperlipidemia, GERD, depression, anxiety, mitral regurgitation, diastolic congestive heart failure, DJD, OCD, who presents with productive cough, shortness of breath and wheezing. Recently hospitalized several times last year for same.   Assessment/Plan: Acute on chronic respiratory failure ([place]) due to COPD exacerbation / HCAP  - CXR has persistent lingula lesion - CTA-chest on 01/21/15 without mass or PE.  - continued on IV steroids, antibiotics, nebulizers - Continued on Brovana, continue to monitor respiratory status. - Pulmonary following, appreciate input.  - Patient underwent bronchoscopy on 1/18, see below for findings  Essential hypertension - Cont cardizem, metoprolol - On lasix - Controlled currently, no changes in the current regimen  GERD - Protonix  Depression and anxiety  - Stable, no suicidal or homicidal ideations. - Continue home medications: BuSpar, Prozac  PAF: CHA2DS2-VASc Score is 4, needs oral anticoagulation. Heart rate is well controlled. Patient refuses anticoagulation. Patient is currently preferring to be on aspirin - ASA and Cardizem - Rate controlled, telemetry reviewed 1/17, heart rates with adequate control   Chronic diastolic (congestive) heart failure ([place])  - 2-D echo on 03/07/14 showed EF 50-60%. Patient does not have leg edema, CHF is compensated. - Continue aspirin, metoprolol and home dose Lasix  Squamous Cell Lung CA - Cytology washings pos for NSCLC - Pulm consulted Oncology - Given COPD, doubt will be good candidate for surgery - MRI brain and CT abd neg for metastatic disease  Code Status: Full Family Communication: Pt in room Disposition Plan: Transfer to  floor   Consultants:  Pulmonary  Oncology  Procedures:  Bronchoscopy 1/18  Antibiotics: Anti-infectives    Start     Dose/Rate Route Frequency Ordered Stop   02/24/15 1200  vancomycin (VANCOCIN) IVPB 1000 mg/200 mL premix  Status:  Discontinued     1,000 mg 200 mL/hr over 60 Minutes Intravenous Every 12 hours 02/24/15 0004 03/01/15 0905   02/23/15 2359  ceFEPIme (MAXIPIME) 1 g in dextrose 5 % 50 mL IVPB     1 g 100 mL/hr over 30 Minutes Intravenous Every 8 hours 02/23/15 2308 03/02/15 2359   02/23/15 2245  vancomycin (VANCOCIN) IVPB 1000 mg/200 mL premix     1,000 mg 200 mL/hr over 60 Minutes Intravenous  Once 02/23/15 2243 02/24/15 0039      HPI/Subjective: Feeling anxious about diagnosis  Objective: Filed Vitals:   03/01/15 0800 03/01/15 0921 03/01/15 1047 03/01/15 1200  BP:   116/42   Pulse:      Temp: 98.1 F (36.7 C)   98.1 F (36.7 C)  TempSrc: Oral   Oral  Resp:   18   Height:      Weight:      SpO2:  95% 97%     Intake/Output Summary (Last 24 hours) at 03/01/15 1410 Last data filed at 02/28/15 2349  Gross per 24 hour  Intake    550 ml  Output      0 ml  Net    550 ml   Filed Weights   02/26/15 0436 02/27/15 0602 02/27/15 1030  Weight: 83.2 kg (183 lb 6.8 oz) 82.3 kg (181 lb 7 oz) 85.3 kg (188 lb 0.8 oz)    Exam:   General:  Awake, in nad, sitting in bed  Cardiovascular: regular, s1, s2  Respiratory:  normal resp effort, decreased breathsounds throughout, no audible wheezing  Abdomen: soft,nondistended, pos BS  Musculoskeletal: perfused, no cyanosis  Data Reviewed: Basic Metabolic Panel:  Recent Labs Lab 02/23/15 1922 02/24/15 0524 02/26/15 1055 02/27/15 0650 02/28/15 0337  NA 145 146*  --  142 143  K 3.7 4.4  --  4.4 4.5  CL 104 103  --  100* 103  CO2 32 31  --  32 32  GLUCOSE 108* 155*  --  128* 218*  BUN 16 15  --  26* 32*  CREATININE 0.78 0.76 0.74 0.75 0.73  CALCIUM 8.6* 9.5  --  8.8* 8.9   Liver Function  Tests:  Recent Labs Lab 02/24/15 0524  AST 17  ALT 13*  ALKPHOS 98  BILITOT 0.7  PROT 6.6  ALBUMIN 3.5   No results for input(s): LIPASE, AMYLASE in the last 168 hours. No results for input(s): AMMONIA in the last 168 hours. CBC:  Recent Labs Lab 02/23/15 1922 02/24/15 0524 02/27/15 0650 02/28/15 0337  WBC 7.0 6.3 11.3* 8.9  NEUTROABS 4.7  --   --   --   HGB 9.6* 10.8* 11.2* 10.5*  HCT 32.7* 37.4 38.9 35.7*  MCV 96.2 99.5 98.5 98.6  PLT 206 212 242 207   Cardiac Enzymes: No results for input(s): CKTOTAL, CKMB, CKMBINDEX, TROPONINI in the last 168 hours. BNP (last 3 results)  Recent Labs  01/30/15 0625 02/04/15 1142 02/24/15 0524  BNP 181.4* 217.6* 306.5*    ProBNP (last 3 results) No results for input(s): PROBNP in the last 8760 hours.  CBG: No results for input(s): GLUCAP in the last 168 hours.  Recent Results (from the past 240 hour(s))  Culture, expectorated sputum-assessment     Status: None   Collection Time: 02/26/15  1:00 AM  Result Value Ref Range Status   Specimen Description SPUTUM  Final   Special Requests NONE  Final   Sputum evaluation   Final    THIS SPECIMEN IS ACCEPTABLE. RESPIRATORY CULTURE REPORT TO FOLLOW.   Report Status 02/26/2015 FINAL  Final  Culture, respiratory (NON-Expectorated)     Status: None   Collection Time: 02/26/15  1:00 AM  Result Value Ref Range Status   Specimen Description SPU  Final   Special Requests NONE  Final   Gram Stain   Final    ABUNDANT WBC PRESENT, PREDOMINANTLY PMN RARE SQUAMOUS EPITHELIAL CELLS PRESENT FEW YEAST Performed at [facility]    Culture   Final    NORMAL OROPHARYNGEAL FLORA Performed at [facility]    Report Status 02/28/2015 FINAL  Final  Culture, bal-quantitative     Status: None   Collection Time: 02/27/15  8:58 AM  Result Value Ref Range Status   Specimen Description BRONCHIAL ALVEOLAR LAVAGE  Final   Special Requests NONE  Final   Gram Stain   Final     ABUNDANT WBC PRESENT,BOTH PMN AND MONONUCLEAR MODERATE SQUAMOUS EPITHELIAL CELLS PRESENT RARE YEAST RARE GRAM POSITIVE COCCI IN PAIRS    Colony Count   Final    80,000 COLONIES/ML Performed at [facility]    Culture   Final    NORMAL OROPHARYNGEAL FLORA Performed at [facility]    Report Status 03/01/2015 FINAL  Final  AFB culture with smear     Status: None (Preliminary result)   Collection Time: 02/27/15  8:58 AM  Result Value Ref Range Status   Specimen Description BRONCHIAL ALVEOLAR LAVAGE  Final   Special  Requests NONE  Final   Acid Fast Smear   Final    NO ACID FAST BACILLI SEEN Performed at [facility]    Culture   Final    CULTURE WILL BE EXAMINED FOR 6 WEEKS BEFORE ISSUING A FINAL REPORT Performed at [facility]    Report Status PENDING  Incomplete  Fungus Culture with Smear     Status: None (Preliminary result)   Collection Time: 02/27/15  8:58 AM  Result Value Ref Range Status   Specimen Description BRONCHIAL ALVEOLAR LAVAGE  Final   Special Requests NONE  Final   Fungal Smear RARE YEAST Performed at [facility]   Final   Culture   Final    CULTURE IN PROGRESS FOR FOUR WEEKS Performed at [facility]    Report Status PENDING  Incomplete  MRSA PCR Screening     Status: None   Collection Time: 02/27/15 11:15 AM  Result Value Ref Range Status   MRSA by PCR NEGATIVE NEGATIVE Final    Comment:        The GeneXpert MRSA Assay (FDA approved for NASAL specimens only), is one component of a comprehensive MRSA colonization surveillance program. It is not intended to diagnose MRSA infection nor to guide or monitor treatment for MRSA infections.      Studies: Mr Jeri Cos Wo Contrast  2015-03-08  CLINICAL DATA:  Squamous cell lung cancer staging EXAM: MRI HEAD WITHOUT AND WITH CONTRAST TECHNIQUE: Multiplanar, multiecho pulse sequences of the brain and surrounding structures were obtained without and with  intravenous contrast. CONTRAST:  64m MULTIHANCE GADOBENATE DIMEGLUMINE 529 MG/ML IV SOLN COMPARISON:  CT head 07/15/2014. FINDINGS: Image quality degraded by motion. Initially unenhanced scan was performed. After further communication, the patient returned for postcontrast imaging to rule out metastatic disease. Ventricle size normal.  Cerebral volume normal for age. Negative for acute infarct. Scattered small white matter hyperintensities. Moderate hyperintensity throughout the pons. Negative for intracranial hemorrhage. Negative for mass or edema. No shift of the midline structures. Postcontrast imaging degraded by motion. No enhancing metastatic deposits identified. Paranasal sinuses clear. No orbital mass. No skull lesion identified. IMPRESSION: Image quality degraded by motion. Negative for metastatic disease Mild chronic microvascular ischemic change in the white matter. Moderate chronic changes in the pons. Electronically Signed   By: CFranchot GalloM.D.   On: [id number]-Jan-[id number]   Ct Abdomen Pelvis W Contrast  02/28/2015  CLINICAL DATA:  70year old female with history of lung cancer presenting with productive cough and shortness of breath and wheezing. Metastatic survey. EXAM: CT ABDOMEN AND PELVIS WITH CONTRAST TECHNIQUE: Multidetector CT imaging of the abdomen and pelvis was performed using the standard protocol following bolus administration of intravenous contrast. CONTRAST:  573mOMNIPAQUE IOHEXOL 300 MG/ML SOLN, 10029mMNIPAQUE IOHEXOL 300 MG/ML SOLN COMPARISON:  CT dated 07/05/2014 FINDINGS: Partially visualized subsegmental densities at the right lung base and lingula likely represent atelectatic changes. There is emphysematous changes of the lung bases. There is coronary vascular calcification. No intra-abdominal free air or free fluid. The gallbladder is filled with stones. A stone is noted in the neck of the gallbladder. No pericholecystic fluid. The liver, pancreas, spleen, and the adrenal  glands appear unremarkable. There is no hydronephrosis on either side. The visualized ureters and urinary bladder appear unremarkable. The uterus is grossly unremarkable. There is a mildly dilated segment of proximal small bowel measuring up to 4 cm in diameter. No transition zone identified. The oral contrast traverses into  the distal small bowel without evidence of obstruction. This likely represents a segmental ileus. There is sigmoid diverticulosis without active inflammatory changes. Copious amount of dense stool noted throughout the colon. No evidence of bowel obstruction or inflammation. Normal appendix. There is aortoiliac atherosclerotic disease. There is ectasia of the infrarenal abdominal aorta measuring up to 2.8 cm in diameter. The origins of the celiac axis, SMA, IMA as well as the origins of the renal arteries are patent. No portal venous gas identified. There is no adenopathy. There is diffuse subcutaneous soft tissue stranding and edema. No drainable fluid collection or hematoma. Osteopenia with degenerative changes of the spine. No acute fracture. IMPRESSION: No CT evidence of intra-abdominal or pelvic metastatic disease. Cholelithiasis. Constipation. No evidence of bowel obstruction or inflammation. Normal appendix. Sigmoid diverticulosis. Electronically Signed   By: Anner Crete M.D.   On: 02/28/2015 23:58    Scheduled Meds: . arformoterol  15 mcg Nebulization BID  . aspirin  650 mg Oral Daily  . busPIRone  10 mg Oral TID  . ceFEPime (MAXIPIME) IV  1 g Intravenous Q8H  . dextromethorphan-guaiFENesin  1 tablet Oral BID  . diltiazem  120 mg Oral 3 times per day  . FLUoxetine  20 mg Oral Daily  . furosemide  40 mg Oral Daily  . heparin  5,000 Units Subcutaneous 3 times per day  . methylPREDNISolone (SOLU-MEDROL) injection  40 mg Intravenous Q12H  . metoprolol succinate  25 mg Oral Daily  . pantoprazole  40 mg Oral Daily  . sodium chloride  3 mL Intravenous Q12H  . tiotropium  18  mcg Inhalation Daily   Continuous Infusions: . sodium chloride 10 mL/hr at 02/28/15 1000    Principal Problem:   Acute on chronic respiratory failure (HCC) Active Problems:   HLD (hyperlipidemia)   Depression with anxiety   Essential hypertension   GERD (gastroesophageal reflux disease)   Osteoporosis   COPD exacerbation ([facility])   Atrial fibrillation (H[facility])   Chronic diastolic (congestive) heart failure (H[facility])   Lobar pneumonia due to unspecified organism   Chronic obstructive pulmonary disease with acute exacerbation ([place])   HCAP (healthcare-associated pneumonia)   Lingular pneumonia   CHIU, STEPHEN K  [facility] Pager [phone]. If 7PM-7AM, please contact night-coverage at [email or web address], password [facility] 03/01/2015, 2:10 PM  LOS: 6 days

## 2015-03-01 NOTE — Progress Notes (Signed)
Pharmacy Antibiotic Follow-up Note  Kelsey Garcia is a 70 y.o. year-old female admitted on 02/23/2015.  The patient is currently on day 6 of Cefepime and Vancomycin for pneumonia.  Assessment/Plan: After discussion with Dr. Halford Chessman, the current antibiotic(s) Cefepime and Vancomycin will be narrowed to Cefepime alone and continued for 7 days.  The stop date is entered and will be 1/21.  Temp (24hrs), Avg:98.1 F (36.7 C), Min:97.9 F (36.6 C), Max:98.4 F (36.9 C)   Recent Labs Lab 02/23/15 1922 02/24/15 0524 02/27/15 0650 02/28/15 0337  WBC 7.0 6.3 11.3* 8.9    Recent Labs Lab 02/23/15 1922 02/24/15 0524 02/26/15 1055 02/27/15 0650 02/28/15 0337  CREATININE 0.78 0.76 0.74 0.75 0.73   Estimated Creatinine Clearance: 72.6 mL/min (by C-G formula based on Cr of 0.73).    Allergies  Allergen Reactions  . Ace Inhibitors Cough  . Pseudoeph-Doxylamine-Dm-Apap Itching  . Diphenhydramine Hcl Other (See Comments)    "feels like my skin is crawling, and legs twitches"  . Erythromycin Diarrhea  . Nsaids Nausea And Vomiting  . Nyquil [Pseudoeph-Doxylamine-Dm-Apap] Itching  . Tramadol Hcl Other (See Comments)    stomach pain, hallucinations  . Codeine Swelling    Face and lips swelling  Patient report Can tolerate hydrocodone  . Fosamax [Alendronate] Other (See Comments)    Nausea and abdominal bloating.   . Tomato Other (See Comments)    Stomach swells  . Orange Fruit [Citrus] Rash  . Pseudoephedrine Palpitations    Antimicrobials this admission: 1/14 >> cefepime >> (1/21) 1/14 >> vancomycin >> 1/20  Levels/dose changes this admission: 1/17 1100 VT = 17 mcg/ml on 1g q12h  Microbiology results: 1/15 influenza panel NEG  1/15 Strep pneumo ur ag: NEG 1/17 sputum cx: few yeast 1/18 BAL: rare yeast and GPC, reincubated. 1/18 MRSA: neg  Thank you for allowing pharmacy to be a part of this patient's care.  Gretta Arab PharmD, BCPS Pager [phone] 03/01/2015 9:21  AM

## 2015-03-01 NOTE — Progress Notes (Signed)
PCCM PROGRESS NOTE  Date of Admission: 02/23/2015 Date of Consult: 02/24/2015 Referring Provider: Dr. Blaine Hamper, [facility]  Chief Complaint: Short of breath  Subjective:   Breathing better.  Anxious about bronchoscopy results.  Vital Signs: BP 151/68 mmHg  Pulse 79  Temp(Src) 98.1 F (36.7 C) (Oral)  Resp 25  Ht '5\' 5"'$  (1.651 m)  Wt 188 lb 0.8 oz (85.3 kg)  BMI 31.29 kg/m2  SpO2 96%  General: pleasant, no distress Neuro: alert, normal strength HEENT: no sinus tenderness Cardiac: regular Chest: no wheeze Abd: soft, non tender Ext: no edema  CMP Latest Ref Rng 02/28/2015 02/27/2015 02/26/2015  Glucose 65 - 99 mg/dL 218(H) 128(H) -  BUN 6 - 20 mg/dL 32(H) 26(H) -  Creatinine 0.44 - 1.00 mg/dL 0.73 0.75 0.74  Sodium 135 - 145 mmol/L 143 142 -  Potassium 3.5 - 5.1 mmol/L 4.5 4.4 -  Chloride 101 - 111 mmol/L 103 100(L) -  CO2 22 - 32 mmol/L 32 32 -  Calcium 8.9 - 10.3 mg/dL 8.9 8.8(L) -  Total Protein 6.5 - 8.1 g/dL - - -  Total Bilirubin 0.3 - 1.2 mg/dL - - -  Alkaline Phos 38 - 126 U/L - - -  AST 15 - 41 U/L - - -  ALT 14 - 54 U/L - - -    CBC Latest Ref Rng 02/28/2015 02/27/2015 02/24/2015  WBC 4.0 - 10.5 K/uL 8.9 11.3(H) 6.3  Hemoglobin 12.0 - 15.0 g/dL 10.5(L) 11.2(L) 10.8(L)  Hematocrit 36.0 - 46.0 % 35.7(L) 38.9 37.4  Platelets 150 - 400 K/uL 207 242 212    Mr Brain W Wo Contrast  03/01/2015  CLINICAL DATA:  Squamous cell lung cancer staging EXAM: MRI HEAD WITHOUT AND WITH CONTRAST TECHNIQUE: Multiplanar, multiecho pulse sequences of the brain and surrounding structures were obtained without and with intravenous contrast. CONTRAST:  30m MULTIHANCE GADOBENATE DIMEGLUMINE 529 MG/ML IV SOLN COMPARISON:  CT head 07/15/2014. FINDINGS: Image quality degraded by motion. Initially unenhanced scan was performed. After further communication, the patient returned for postcontrast imaging to rule out metastatic disease. Ventricle size normal.  Cerebral volume normal for age. Negative for  acute infarct. Scattered small white matter hyperintensities. Moderate hyperintensity throughout the pons. Negative for intracranial hemorrhage. Negative for mass or edema. No shift of the midline structures. Postcontrast imaging degraded by motion. No enhancing metastatic deposits identified. Paranasal sinuses clear. No orbital mass. No skull lesion identified. IMPRESSION: Image quality degraded by motion. Negative for metastatic disease Mild chronic microvascular ischemic change in the white matter. Moderate chronic changes in the pons. Electronically Signed   By: CFranchot GalloM.D.   On: 03/01/2015 06:38   Ct Abdomen Pelvis W Contrast  02/28/2015  CLINICAL DATA:  70year old female with history of lung cancer presenting with productive cough and shortness of breath and wheezing. Metastatic survey. EXAM: CT ABDOMEN AND PELVIS WITH CONTRAST TECHNIQUE: Multidetector CT imaging of the abdomen and pelvis was performed using the standard protocol following bolus administration of intravenous contrast. CONTRAST:  545mOMNIPAQUE IOHEXOL 300 MG/ML SOLN, 10016mMNIPAQUE IOHEXOL 300 MG/ML SOLN COMPARISON:  CT dated 07/05/2014 FINDINGS: Partially visualized subsegmental densities at the right lung base and lingula likely represent atelectatic changes. There is emphysematous changes of the lung bases. There is coronary vascular calcification. No intra-abdominal free air or free fluid. The gallbladder is filled with stones. A stone is noted in the neck of the gallbladder. No pericholecystic fluid. The liver, pancreas, spleen, and the adrenal glands appear unremarkable. There  is no hydronephrosis on either side. The visualized ureters and urinary bladder appear unremarkable. The uterus is grossly unremarkable. There is a mildly dilated segment of proximal small bowel measuring up to 4 cm in diameter. No transition zone identified. The oral contrast traverses into the distal small bowel without evidence of obstruction. This  likely represents a segmental ileus. There is sigmoid diverticulosis without active inflammatory changes. Copious amount of dense stool noted throughout the colon. No evidence of bowel obstruction or inflammation. Normal appendix. There is aortoiliac atherosclerotic disease. There is ectasia of the infrarenal abdominal aorta measuring up to 2.8 cm in diameter. The origins of the celiac axis, SMA, IMA as well as the origins of the renal arteries are patent. No portal venous gas identified. There is no adenopathy. There is diffuse subcutaneous soft tissue stranding and edema. No drainable fluid collection or hematoma. Osteopenia with degenerative changes of the spine. No acute fracture. IMPRESSION: No CT evidence of intra-abdominal or pelvic metastatic disease. Cholelithiasis. Constipation. No evidence of bowel obstruction or inflammation. Normal appendix. Sigmoid diverticulosis. Electronically Signed   By: Anner Crete M.D.   On: 02/28/2015 23:58   Dg Chest Port 1 View  02/27/2015  CLINICAL DATA:  Status post bronchoscopy EXAM: PORTABLE CHEST 1 VIEW COMPARISON:  Chest radiograph February 23, 2015; chest CT February 25 2015 FINDINGS: There is no apparent pneumothorax. The ill-defined opacity in the superior lingula remains. There there is slight generalized interstitial edema. Heart is borderline enlarged with mild pulmonary venous hypertension. No adenopathy apparent. IMPRESSION: Persistent ill-defined opacity superior lingula. Evidence of a mild degree of congestive heart failure. No pneumothorax. Electronically Signed   By: Lowella Grip III M.D.   On: 02/27/2015 09:37   Dg C-arm Bronchoscopy  02/27/2015  CLINICAL DATA:  C-ARM BRONCHOSCOPY Fluoroscopy was utilized by the requesting physician.  No radiographic interpretation.   Cultures: 1/17 Sputum >> oral flora 1/18 BAL >> 1/18 BAL AFB >> 1/18 BAL Fungal >>  Antibiotics: 1/14 Vancomycin >> 1/20 1/14 Cefepime >>  Studies: 1/16 CT chest >>  segmental opacification of lingula, emphysema 1/18 Bronchoscopy >> NSCLC (Squamous cell)  Events: 1/14 Admit 1/18 Bronchoscopy >> hypoxia/hypercapnia post procedure >> to SDU 1/20 Oncology consulted  Discussion: 70 y/o female former smoker was in hospital in December 2016 with pneumonia, presented with progressive dyspnea, cough, and sputum.  She has persistent/progressive lingular infiltrate >> bronchoscopy results consistent with NSCLC (Squamous cell).  She is followed by [name] for COPD and chronic hypoxic respiratory failure.  She also has hx of chronic diastolic CHF, A fib, HLD, HTN, non ischemic CM, GERD, OCD.  Assessment/Plan:  COPD exacerbation. - continue brovana, spiriva - wean off steroids as tolerated - prn BDs - Abx per primary team >> will d/c vancomycin 1/20 - bronchial hygiene  Acute on chronic respiratory failure with hypoxia. - adjust oxygen to keep SpO2 90 to 95%  Persistent lingular infiltrate 2nd to NSCLC (Squamous cell). - oncology consulted 1/20  D/w Dr. Wyline Copas.  PCCM will f/u on Monday, 03/04/15 >> call if help needed sooner.   Chesley Mires, MD [facility] Pulmonary/Critical Care 03/01/2015, 9:00 AM Pager:  [phone] After 3pm call: [phone]

## 2015-03-01 NOTE — Progress Notes (Signed)
Physical Therapy Treatment Patient Details Name: Kelsey Garcia MRN: [id number] DOB: [birth year] Today's Date: 31-Mar-2015    History of Present Illness 70 y.o. female with PMH of COPD on 2 L oxygen at home, atrial fibrillation (patient refused anticoagulant), hyperlipidemia, GERD, depression, anxiety, mitral regurgitation, diastolic congestive heart failure, DJD, OCD, who presents with productive cough, shortness of breath and wheezing. Recently hospitalized several times last year for same.     PT Comments    Pt progressing, reports she is mildly anxious d/t dx but willing to work with PT this am  Follow Up Recommendations  Supervision - Intermittent;Home health PT     Equipment Recommendations  None recommended by PT    Recommendations for Other Services       Precautions / Restrictions Precautions Precautions: Fall Precaution Comments: O2/ monitor sats    Mobility  Bed Mobility Overal bed mobility: Modified Independent                Transfers Overall transfer level: Needs assistance Equipment used: Rolling walker (2 wheeled) Transfers: Sit to/from Stand Sit to Stand: Supervision         General transfer comment: for lines and safety  Ambulation/Gait Ambulation/Gait assistance: Supervision;Min guard Ambulation Distance (Feet): 80 Feet Assistive device: Rolling walker (2 wheeled) Gait Pattern/deviations: Step-through pattern;Decreased stride length;Trunk flexed     General Gait Details: 1 standing rest, cues for RW distance from self, fatigues quickly   Stairs            Wheelchair Mobility    Modified Rankin (Stroke Patients Only)       Balance           Standing balance support: No upper extremity supported Standing balance-Leahy Scale: Fair                      Cognition Arousal/Alertness: Awake/alert Behavior During Therapy: WFL for tasks assessed/performed Overall Cognitive Status: Within Functional Limits for tasks  assessed                      Exercises      General Comments        Pertinent Vitals/Pain Pain Assessment: 0-10 Pain Score: 6  Pain Location: pt does not specify Pain Descriptors / Indicators: Constant Pain Intervention(s): Limited activity within patient's tolerance;Monitored during session    Home Living                      Prior Function            PT Goals (current goals can now be found in the care plan section) Acute Rehab PT Goals Patient Stated Goal: start to feel better PT Goal Formulation: With patient Time For Goal Achievement: 03/03/15 Potential to Achieve Goals: Good Progress towards PT goals: Progressing toward goals    Frequency  Min 3X/week    PT Plan Current plan remains appropriate    Co-evaluation             End of Session Equipment Utilized During Treatment: Oxygen Activity Tolerance: Patient limited by fatigue Patient left: in chair;with call bell/phone within reach     Time: 0935-0950 PT Time Calculation (min) (ACUTE ONLY): 15 min  Charges:  $Gait Training: 8-22 mins                    G Codes:      Heberto Sturdevant 31-Mar-2015, 9:53 AM
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
## 2015-03-02 DIAGNOSIS — Z515 Encounter for palliative care: Secondary | ICD-10-CM | POA: Insufficient documentation

## 2015-03-02 DIAGNOSIS — M25559 Pain in unspecified hip: Secondary | ICD-10-CM | POA: Insufficient documentation

## 2015-03-02 DIAGNOSIS — F411 Generalized anxiety disorder: Secondary | ICD-10-CM

## 2015-03-02 MED ORDER — BENZONATATE 100 MG PO CAPS
100.0000 mg | ORAL_CAPSULE | Freq: Once | ORAL | Status: DC
  Filled 2015-03-02: qty 1

## 2015-03-02 MED ORDER — HYDROMORPHONE HCL 1 MG/ML IJ SOLN: 0.5000 mg | INTRAMUSCULAR | Status: DC | PRN

## 2015-03-02 MED ORDER — HYDROCODONE-ACETAMINOPHEN 10-325 MG PO TABS
1.0000 | ORAL_TABLET | Freq: Four times a day (QID) | ORAL | Status: DC | PRN
  Administered 2015-03-02 – 2015-03-05 (×11): 1 via ORAL
  Filled 2015-03-02 (×11): qty 1

## 2015-03-02 MED ORDER — HYDROCODONE-ACETAMINOPHEN 10-325 MG PO TABS: 1.0000 | ORAL_TABLET | Freq: Four times a day (QID) | ORAL | Status: DC | PRN

## 2015-03-02 MED ORDER — DIAZEPAM 2 MG PO TABS
2.0000 mg | ORAL_TABLET | Freq: Four times a day (QID) | ORAL | Status: DC | PRN
  Administered 2015-03-02: 2 mg via ORAL
  Filled 2015-03-02: qty 1

## 2015-03-02 NOTE — Progress Notes (Signed)
Report given to Amber on 4th floor and pt transferred to 1434. Left unit in wheelchair pushed by nurse tech and on oxygen. Left in stable condition. Medicated for pain prior to transfer. Amber, rn made aware. Vwilliams,rn.

## 2015-03-02 NOTE — Progress Notes (Signed)
[facility] PROGRESS NOTE  CABRINI RUGGIERI JHE:[id number] DOB: May 24, 1945 DOA: 02/23/2015 PCP: Darden Amber, PA  HPI/Brief narrative 70 y.o. female with PMH of COPD on 2 L oxygen at home, atrial fibrillation (patient refused anticoagulant), hyperlipidemia, GERD, depression, anxiety, mitral regurgitation, diastolic congestive heart failure, DJD, OCD, who presents with productive cough, shortness of breath and wheezing. Recently hospitalized several times last year for same.   Assessment/Plan: Acute on chronic respiratory failure ([place]) due to COPD exacerbation / HCAP  - CXR has persistent lingula lesion - CTA-chest on 01/21/15 without mass or PE.  - continued on IV steroids, antibiotics, nebulizers - Continued on Brovana, continue to monitor respiratory status. - Pulmonary following, appreciate input.  - Patient underwent bronchoscopy on 1/18, see below for findings  Essential hypertension - Cont cardizem, metoprolol - Cont on lasix as tolerated - Controlled currently thus far, no changes in the current regimen  GERD - Protonix  Depression and anxiety  - Stable, no suicidal or homicidal ideations. - Continue home medications: BuSpar, Prozac  PAF: CHA2DS2-VASc Score is 4, needs oral anticoagulation. Heart rate is well controlled. Patient refuses anticoagulation. Patient is currently preferring to be on aspirin - ASA and Cardizem - Rate controlled, telemetry reviewed 1/17, heart rates with adequate control   Chronic diastolic (congestive) heart failure ([place])  - 2-D echo on 03/07/14 showed EF 50-60%. Patient does not have leg edema, CHF remains compensated. - Continue aspirin, metoprolol and home dose Lasix  Squamous Cell Lung CA - Cytology washings pos for NSCLC - Pulm had consulted Oncology - Given COPD, doubt will be good candidate for surgery - MRI brain and CT abd neg for metastatic disease - Palliative Care was consulted. Appreciate input. Pain and anxiety med changes  noted. Remains full code at this time  Code Status: Full Family Communication: Pt in room Disposition Plan: Transfer to floor   Consultants:  Pulmonary  Oncology  Palliative Care  Procedures:  Bronchoscopy 1/18  Antibiotics: Anti-infectives    Start     Dose/Rate Route Frequency Ordered Stop   02/24/15 1200  vancomycin (VANCOCIN) IVPB 1000 mg/200 mL premix  Status:  Discontinued     1,000 mg 200 mL/hr over 60 Minutes Intravenous Every 12 hours 02/24/15 0004 03/01/15 0905   02/23/15 2359  ceFEPIme (MAXIPIME) 1 g in dextrose 5 % 50 mL IVPB     1 g 100 mL/hr over 30 Minutes Intravenous Every 8 hours 02/23/15 2308 03/02/15 2359   02/23/15 2245  vancomycin (VANCOCIN) IVPB 1000 mg/200 mL premix     1,000 mg 200 mL/hr over 60 Minutes Intravenous  Once 02/23/15 2243 02/24/15 0039      HPI/Subjective: Reports feeling less anxious today. States she feels better after speaking with Palliative Care  Objective: Filed Vitals:   03/02/15 0848 03/02/15 0912 03/02/15 1200 03/02/15 1252  BP: 116/55   136/67  Pulse:      Temp:   98.3 F (36.8 C)   TempSrc:   Oral   Resp:    16  Height:      Weight:      SpO2: 99% 97%  100%   No intake or output data in the 24 hours ending 03/02/15 1603 Filed Weights   02/27/15 0602 02/27/15 1030 03/02/15 0500  Weight: 82.3 kg (181 lb 7 oz) 85.3 kg (188 lb 0.8 oz) 83.9 kg (184 lb 15.5 oz)    Exam:   General:  Awake, in nad, sitting in bed, eating  Cardiovascular: regular, s1,  s2  Respiratory: normal resp effort, decreased breathsounds throughout, no audible wheezing  Abdomen: soft,nondistended, pos BS  Musculoskeletal: perfused, no cyanosis, no clubbing  Data Reviewed: Basic Metabolic Panel:  Recent Labs Lab 02/23/15 1922 02/24/15 0524 02/26/15 1055 02/27/15 0650 02/28/15 0337  NA 145 146*  --  142 143  K 3.7 4.4  --  4.4 4.5  CL 104 103  --  100* 103  CO2 32 31  --  32 32  GLUCOSE 108* 155*  --  128* 218*  BUN 16 15   --  26* 32*  CREATININE 0.78 0.76 0.74 0.75 0.73  CALCIUM 8.6* 9.5  --  8.8* 8.9   Liver Function Tests:  Recent Labs Lab 02/24/15 0524  AST 17  ALT 13*  ALKPHOS 98  BILITOT 0.7  PROT 6.6  ALBUMIN 3.5   No results for input(s): LIPASE, AMYLASE in the last 168 hours. No results for input(s): AMMONIA in the last 168 hours. CBC:  Recent Labs Lab 02/23/15 1922 02/24/15 0524 02/27/15 0650 02/28/15 0337  WBC 7.0 6.3 11.3* 8.9  NEUTROABS 4.7  --   --   --   HGB 9.6* 10.8* 11.2* 10.5*  HCT 32.7* 37.4 38.9 35.7*  MCV 96.2 99.5 98.5 98.6  PLT 206 212 242 207   Cardiac Enzymes: No results for input(s): CKTOTAL, CKMB, CKMBINDEX, TROPONINI in the last 168 hours. BNP (last 3 results)  Recent Labs  01/30/15 0625 02/04/15 1142 02/24/15 0524  BNP 181.4* 217.6* 306.5*    ProBNP (last 3 results) No results for input(s): PROBNP in the last 8760 hours.  CBG: No results for input(s): GLUCAP in the last 168 hours.  Recent Results (from the past 240 hour(s))  Culture, expectorated sputum-assessment     Status: None   Collection Time: 02/26/15  1:00 AM  Result Value Ref Range Status   Specimen Description SPUTUM  Final   Special Requests NONE  Final   Sputum evaluation   Final    THIS SPECIMEN IS ACCEPTABLE. RESPIRATORY CULTURE REPORT TO FOLLOW.   Report Status 02/26/2015 FINAL  Final  Culture, respiratory (NON-Expectorated)     Status: None   Collection Time: 02/26/15  1:00 AM  Result Value Ref Range Status   Specimen Description SPU  Final   Special Requests NONE  Final   Gram Stain   Final    ABUNDANT WBC PRESENT, PREDOMINANTLY PMN RARE SQUAMOUS EPITHELIAL CELLS PRESENT FEW YEAST Performed at [facility]    Culture   Final    NORMAL OROPHARYNGEAL FLORA Performed at [facility]    Report Status 02/28/2015 FINAL  Final  Culture, bal-quantitative     Status: None   Collection Time: 02/27/15  8:58 AM  Result Value Ref Range Status   Specimen  Description BRONCHIAL ALVEOLAR LAVAGE  Final   Special Requests NONE  Final   Gram Stain   Final    ABUNDANT WBC PRESENT,BOTH PMN AND MONONUCLEAR MODERATE SQUAMOUS EPITHELIAL CELLS PRESENT RARE YEAST RARE GRAM POSITIVE COCCI IN PAIRS    Colony Count   Final    80,000 COLONIES/ML Performed at [facility]    Culture   Final    NORMAL OROPHARYNGEAL FLORA Performed at [facility]    Report Status 03/01/2015 FINAL  Final  AFB culture with smear     Status: None (Preliminary result)   Collection Time: 02/27/15  8:58 AM  Result Value Ref Range Status   Specimen Description BRONCHIAL ALVEOLAR LAVAGE  Final   Special Requests NONE  Final   Acid Fast Smear   Final    NO ACID FAST BACILLI SEEN Performed at [facility]    Culture   Final    CULTURE WILL BE EXAMINED FOR 6 WEEKS BEFORE ISSUING A FINAL REPORT Performed at [facility]    Report Status PENDING  Incomplete  Fungus Culture with Smear     Status: None (Preliminary result)   Collection Time: 02/27/15  8:58 AM  Result Value Ref Range Status   Specimen Description BRONCHIAL ALVEOLAR LAVAGE  Final   Special Requests NONE  Final   Fungal Smear RARE YEAST Performed at [facility]   Final   Culture   Final    YEAST ISOLATED;ID TO FOLLOW Performed at [facility]    Report Status PENDING  Incomplete  MRSA PCR Screening     Status: None   Collection Time: 02/27/15 11:15 AM  Result Value Ref Range Status   MRSA by PCR NEGATIVE NEGATIVE Final    Comment:        The GeneXpert MRSA Assay (FDA approved for NASAL specimens only), is one component of a comprehensive MRSA colonization surveillance program. It is not intended to diagnose MRSA infection nor to guide or monitor treatment for MRSA infections.      Studies: Mr Jeri Cos Wo Contrast  03/25/2015  CLINICAL DATA:  Squamous cell lung cancer staging EXAM: MRI HEAD WITHOUT AND WITH CONTRAST TECHNIQUE: Multiplanar,  multiecho pulse sequences of the brain and surrounding structures were obtained without and with intravenous contrast. CONTRAST:  63m MULTIHANCE GADOBENATE DIMEGLUMINE 529 MG/ML IV SOLN COMPARISON:  CT head 07/15/2014. FINDINGS: Image quality degraded by motion. Initially unenhanced scan was performed. After further communication, the patient returned for postcontrast imaging to rule out metastatic disease. Ventricle size normal.  Cerebral volume normal for age. Negative for acute infarct. Scattered small white matter hyperintensities. Moderate hyperintensity throughout the pons. Negative for intracranial hemorrhage. Negative for mass or edema. No shift of the midline structures. Postcontrast imaging degraded by motion. No enhancing metastatic deposits identified. Paranasal sinuses clear. No orbital mass. No skull lesion identified. IMPRESSION: Image quality degraded by motion. Negative for metastatic disease Mild chronic microvascular ischemic change in the white matter. Moderate chronic changes in the pons. Electronically Signed   By: CFranchot GalloM.D.   On: [id number]-Feb-[id number]   Ct Abdomen Pelvis W Contrast  02/28/2015  CLINICAL DATA:  70year old female with history of lung cancer presenting with productive cough and shortness of breath and wheezing. Metastatic survey. EXAM: CT ABDOMEN AND PELVIS WITH CONTRAST TECHNIQUE: Multidetector CT imaging of the abdomen and pelvis was performed using the standard protocol following bolus administration of intravenous contrast. CONTRAST:  511mOMNIPAQUE IOHEXOL 300 MG/ML SOLN, 10054mMNIPAQUE IOHEXOL 300 MG/ML SOLN COMPARISON:  CT dated 07/05/2014 FINDINGS: Partially visualized subsegmental densities at the right lung base and lingula likely represent atelectatic changes. There is emphysematous changes of the lung bases. There is coronary vascular calcification. No intra-abdominal free air or free fluid. The gallbladder is filled with stones. A stone is noted in the  neck of the gallbladder. No pericholecystic fluid. The liver, pancreas, spleen, and the adrenal glands appear unremarkable. There is no hydronephrosis on either side. The visualized ureters and urinary bladder appear unremarkable. The uterus is grossly unremarkable. There is a mildly dilated segment of proximal small bowel measuring up to 4 cm in diameter. No transition zone identified. The oral contrast  traverses into the distal small bowel without evidence of obstruction. This likely represents a segmental ileus. There is sigmoid diverticulosis without active inflammatory changes. Copious amount of dense stool noted throughout the colon. No evidence of bowel obstruction or inflammation. Normal appendix. There is aortoiliac atherosclerotic disease. There is ectasia of the infrarenal abdominal aorta measuring up to 2.8 cm in diameter. The origins of the celiac axis, SMA, IMA as well as the origins of the renal arteries are patent. No portal venous gas identified. There is no adenopathy. There is diffuse subcutaneous soft tissue stranding and edema. No drainable fluid collection or hematoma. Osteopenia with degenerative changes of the spine. No acute fracture. IMPRESSION: No CT evidence of intra-abdominal or pelvic metastatic disease. Cholelithiasis. Constipation. No evidence of bowel obstruction or inflammation. Normal appendix. Sigmoid diverticulosis. Electronically Signed   By: Anner Crete M.D.   On: 02/28/2015 23:58    Scheduled Meds: . arformoterol  15 mcg Nebulization BID  . aspirin  650 mg Oral Daily  . busPIRone  10 mg Oral TID  . ceFEPime (MAXIPIME) IV  1 g Intravenous Q8H  . dextromethorphan-guaiFENesin  1 tablet Oral BID  . diltiazem  120 mg Oral 3 times per day  . FLUoxetine  20 mg Oral Daily  . furosemide  40 mg Oral Daily  . heparin  5,000 Units Subcutaneous 3 times per day  . methylPREDNISolone (SOLU-MEDROL) injection  40 mg Intravenous Q12H  . metoprolol succinate  25 mg Oral Daily   . pantoprazole  40 mg Oral Daily  . sodium chloride  3 mL Intravenous Q12H  . tiotropium  18 mcg Inhalation Daily   Continuous Infusions: . sodium chloride 10 mL/hr at 02/28/15 1000    Principal Problem:   Acute on chronic respiratory failure ([facility]) Active Problems:   HLD (hyperlipidemia)   Depression with anxiety   Essential hypertension   GERD (gastroesophageal reflux disease)   Osteoporosis   COPD exacerbation ([facility])   Atrial fibrillation ([facility])   Chronic diastolic (congestive) heart failure ([facility])   Lobar pneumonia due to unspecified organism   Chronic obstructive pulmonary disease with acute exacerbation (H[facility])   HCAP (healthcare-associated pneumonia)   Lingular pneumonia   S/P bronchoscopy with biopsy   Primary lung cancer ([place])   Palliative care encounter   Pain in joint, pelvic region and thigh   Anxiety state   [facility], [place] [facility] Pager [phone]. If 7PM-7AM, please contact night-coverage at [email or web address], password [facility] 03/02/2015, 4:03 PM  LOS: 7 days
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
## 2015-03-03 NOTE — Progress Notes (Addendum)
Chaplain visit the result of a [place] requesting information about HCPOA. Ms Friend is not ready at this time to speak further on subject. She is awaiting her daughter in [place] to respond to her wishes before proceeding with the paperwork.  Ms Pennix is a Copy who believes in re-incarnation. Her brother is "a born again [employer] turned [place]." Her daughter is a Designer, television/film set, another an Occupational hygienist, and a granddaughter a Public relations account executive. She is very spiritually oriented and wishes to have her daughter made her 64. She did not tell me which one.  Ms Wyly speaks of being a spiritual lows early in the morning, and reports that a night nurse has been a special spiritual companion during her hospital stay. She did not name the nurse. These early morning times are when her spiritual bearings are most disrupted.  Because the matter of the HCPOA is left open the [place] Consult is being left open for weekday chaplains to negotiate a time for this service.  Sallee Lange. Trigg Delarocha, DMin, MDiv Chaplain

## 2015-03-03 NOTE — Progress Notes (Signed)
[facility] PROGRESS NOTE  Kelsey Garcia PJK:[id number] DOB: [birth year] DOA: 02/23/2015 PCP: Darden Amber, PA  HPI/Brief narrative 70 y.o. female with PMH of COPD on 2 L oxygen at home, atrial fibrillation (patient refused anticoagulant), hyperlipidemia, GERD, depression, anxiety, mitral regurgitation, diastolic congestive heart failure, DJD, OCD, who presents with productive cough, shortness of breath and wheezing. Recently hospitalized several times last year for same.   Assessment/Plan: Acute on chronic respiratory failure ([place]) due to COPD exacerbation / HCAP  - CXR has persistent lingula lesion - CTA-chest on 01/21/15 without mass or PE.  - continued on IV steroids, antibiotics, nebulizers - Continued on Brovana, continue to monitor respiratory status. - Pulmonary following, appreciate input.  - Patient underwent bronchoscopy on 1/18, see below for findings - Remained on 3LNC this AM. Continue to wean  Essential hypertension - Cont cardizem, metoprolol - Cont on lasix as tolerated - Controlled currently thus far, no changes in the current regimen  GERD - Protonix  Depression and anxiety  - Thus far stable, no suicidal or homicidal ideations. - Continue home medications: BuSpar, Prozac  PAF: CHA2DS2-VASc Score is 4, needs oral anticoagulation. Heart rate is well controlled. Patient refused anticoagulation. Patient is currently preferring to be on aspirin - ASA and Cardizem - Rate controlled, telemetry reviewed 1/17, heart rates with adequate control   Chronic diastolic (congestive) heart failure ([place])  - 2-D echo on 03/07/14 showed EF 50-60%. Patient does not have leg edema, CHF remains compensated. - Continue aspirin, metoprolol and home dose Lasix  Squamous Cell Lung CA - Cytology washings pos for NSCLC - Pulm had consulted Oncology - Given COPD, doubt will be good candidate for surgery - MRI brain and CT abd neg for metastatic disease - Palliative Care was  consulted. Appreciate input. Pain and anxiety med changes noted. Remains full code at this time  Code Status: Full Family Communication: Pt in room Disposition Plan: Transfer to floor   Consultants:  Pulmonary  Oncology  Palliative Care  Procedures:  Bronchoscopy 1/18  Antibiotics: Anti-infectives    Start     Dose/Rate Route Frequency Ordered Stop   02/24/15 1200  vancomycin (VANCOCIN) IVPB 1000 mg/200 mL premix  Status:  Discontinued     1,000 mg 200 mL/hr over 60 Minutes Intravenous Every 12 hours 02/24/15 0004 03/01/15 0905   02/23/15 2359  ceFEPIme (MAXIPIME) 1 g in dextrose 5 % 50 mL IVPB     1 g 100 mL/hr over 30 Minutes Intravenous Every 8 hours 02/23/15 2308 03/02/15 2359   02/23/15 2245  vancomycin (VANCOCIN) IVPB 1000 mg/200 mL premix     1,000 mg 200 mL/hr over 60 Minutes Intravenous  Once 02/23/15 2243 02/24/15 0039      HPI/Subjective: Reports feeling drowsy this AM, did not sleep well overnight  Objective: Filed Vitals:   03/03/15 0535 03/03/15 1018 03/03/15 1100 03/03/15 1333  BP: 125/59 130/66  111/57  Pulse: 97 60  91  Temp: 98.3 F (36.8 C)   98.3 F (36.8 C)  TempSrc: Oral   Oral  Resp: 18   20  Height:      Weight: 89.54 kg (197 lb 6.4 oz)     SpO2: 100%  97% 98%    Intake/Output Summary (Last 24 hours) at 03/03/15 1503 Last data filed at 03/03/15 1333  Gross per 24 hour  Intake    370 ml  Output      0 ml  Net    370 ml   Filed  Weights   03/02/15 0500 03/02/15 1645 03/03/15 0535  Weight: 83.9 kg (184 lb 15.5 oz) 84.2 kg (185 lb 10 oz) 89.54 kg (197 lb 6.4 oz)    Exam:   General:  Asleep, easily aroused  Cardiovascular: regular, s1, s2  Respiratory: normal resp effort, decreased breathsounds throughout, no audible wheezing  Abdomen: soft,nondistended, pos BS  Musculoskeletal: perfused,no clubbing  Data Reviewed: Basic Metabolic Panel:  Recent Labs Lab 02/26/15 1055 02/27/15 0650 02/28/15 0337  NA  --  142 143   K  --  4.4 4.5  CL  --  100* 103  CO2  --  32 32  GLUCOSE  --  128* 218*  BUN  --  26* 32*  CREATININE 0.74 0.75 0.73  CALCIUM  --  8.8* 8.9   Liver Function Tests: No results for input(s): AST, ALT, ALKPHOS, BILITOT, PROT, ALBUMIN in the last 168 hours. No results for input(s): LIPASE, AMYLASE in the last 168 hours. No results for input(s): AMMONIA in the last 168 hours. CBC:  Recent Labs Lab 02/27/15 0650 02/28/15 0337  WBC 11.3* 8.9  HGB 11.2* 10.5*  HCT 38.9 35.7*  MCV 98.5 98.6  PLT 242 207   Cardiac Enzymes: No results for input(s): CKTOTAL, CKMB, CKMBINDEX, TROPONINI in the last 168 hours. BNP (last 3 results)  Recent Labs  01/30/15 0625 02/04/15 1142 02/24/15 0524  BNP 181.4* 217.6* 306.5*    ProBNP (last 3 results) No results for input(s): PROBNP in the last 8760 hours.  CBG: No results for input(s): GLUCAP in the last 168 hours.  Recent Results (from the past 240 hour(s))  Culture, expectorated sputum-assessment     Status: None   Collection Time: 02/26/15  1:00 AM  Result Value Ref Range Status   Specimen Description SPUTUM  Final   Special Requests NONE  Final   Sputum evaluation   Final    THIS SPECIMEN IS ACCEPTABLE. RESPIRATORY CULTURE REPORT TO FOLLOW.   Report Status 02/26/2015 FINAL  Final  Culture, respiratory (NON-Expectorated)     Status: None   Collection Time: 02/26/15  1:00 AM  Result Value Ref Range Status   Specimen Description SPU  Final   Special Requests NONE  Final   Gram Stain   Final    ABUNDANT WBC PRESENT, PREDOMINANTLY PMN RARE SQUAMOUS EPITHELIAL CELLS PRESENT FEW YEAST Performed at [facility]    Culture   Final    NORMAL OROPHARYNGEAL FLORA Performed at [facility]    Report Status 02/28/2015 FINAL  Final  Culture, bal-quantitative     Status: None   Collection Time: 02/27/15  8:58 AM  Result Value Ref Range Status   Specimen Description BRONCHIAL ALVEOLAR LAVAGE  Final   Special Requests  NONE  Final   Gram Stain   Final    ABUNDANT WBC PRESENT,BOTH PMN AND MONONUCLEAR MODERATE SQUAMOUS EPITHELIAL CELLS PRESENT RARE YEAST RARE GRAM POSITIVE COCCI IN PAIRS    Colony Count   Final    80,000 COLONIES/ML Performed at [facility]    Culture   Final    NORMAL OROPHARYNGEAL FLORA Performed at [facility]    Report Status 03/01/2015 FINAL  Final  AFB culture with smear     Status: None (Preliminary result)   Collection Time: 02/27/15  8:58 AM  Result Value Ref Range Status   Specimen Description BRONCHIAL ALVEOLAR LAVAGE  Final   Special Requests NONE  Final   Acid Fast Smear  Final    NO ACID FAST BACILLI SEEN Performed at [facility]    Culture   Final    CULTURE WILL BE EXAMINED FOR 6 WEEKS BEFORE ISSUING A FINAL REPORT Performed at [facility]    Report Status PENDING  Incomplete  Fungus Culture with Smear     Status: None (Preliminary result)   Collection Time: 02/27/15  8:58 AM  Result Value Ref Range Status   Specimen Description BRONCHIAL ALVEOLAR LAVAGE  Final   Special Requests NONE  Final   Fungal Smear RARE YEAST Performed at [facility]   Final   Culture   Final    CANDIDA ALBICANS Performed at [facility]    Report Status PENDING  Incomplete  MRSA PCR Screening     Status: None   Collection Time: 02/27/15 11:15 AM  Result Value Ref Range Status   MRSA by PCR NEGATIVE NEGATIVE Final    Comment:        The GeneXpert MRSA Assay (FDA approved for NASAL specimens only), is one component of a comprehensive MRSA colonization surveillance program. It is not intended to diagnose MRSA infection nor to guide or monitor treatment for MRSA infections.      Studies: No results found.  Scheduled Meds: . arformoterol  15 mcg Nebulization BID  . aspirin  650 mg Oral Daily  . benzonatate  100 mg Oral Once  . busPIRone  10 mg Oral TID  . dextromethorphan-guaiFENesin  1 tablet Oral BID  .  diltiazem  120 mg Oral 3 times per day  . FLUoxetine  20 mg Oral Daily  . furosemide  40 mg Oral Daily  . heparin  5,000 Units Subcutaneous 3 times per day  . methylPREDNISolone (SOLU-MEDROL) injection  40 mg Intravenous Q12H  . metoprolol succinate  25 mg Oral Daily  . pantoprazole  40 mg Oral Daily  . sodium chloride  3 mL Intravenous Q12H  . tiotropium  18 mcg Inhalation Daily   Continuous Infusions: . sodium chloride 10 mL/hr at 02/28/15 1000    Principal Problem:   Acute on chronic respiratory failure (HCC) Active Problems:   HLD (hyperlipidemia)   Depression with anxiety   Essential hypertension   GERD (gastroesophageal reflux disease)   Osteoporosis   COPD exacerbation (HCC)   Atrial fibrillation (HCC)   Chronic diastolic (congestive) heart failure (HCC)   Lobar pneumonia due to unspecified organism   Chronic obstructive pulmonary disease with acute exacerbation (HCC)   HCAP (healthcare-associated pneumonia)   Lingular pneumonia   S/P bronchoscopy with biopsy   Primary lung cancer ([place])   Palliative care encounter   Pain in joint, pelvic region and thigh   Anxiety state   Jatziri Goffredo, [facility] Pager [phone]. If 7PM-7AM, please contact night-coverage at [email or web address], password [email or web address] 03/03/2015, 3:03 PM  LOS: 8 days

## 2015-03-04 ENCOUNTER — Other Ambulatory Visit: Payer: Self-pay | Admitting: Radiation Oncology

## 2015-03-04 ENCOUNTER — Ambulatory Visit
Admit: 2015-03-04 | Discharge: 2015-03-04 | Disposition: A | Discharge status: Home / Self Care | Payer: Medicare Other | Source: Ambulatory Visit | Attending: Radiation Oncology | Admitting: Radiation Oncology

## 2015-03-04 DIAGNOSIS — C3492 Malignant neoplasm of unspecified part of left bronchus or lung: Secondary | ICD-10-CM

## 2015-03-04 DIAGNOSIS — C3410 Malignant neoplasm of upper lobe, unspecified bronchus or lung: Secondary | ICD-10-CM

## 2015-03-04 DIAGNOSIS — J9620 Acute and chronic respiratory failure, unspecified whether with hypoxia or hypercapnia: Secondary | ICD-10-CM

## 2015-03-04 DIAGNOSIS — C3490 Malignant neoplasm of unspecified part of unspecified bronchus or lung: Secondary | ICD-10-CM | POA: Insufficient documentation

## 2015-03-04 MED ORDER — FLUCONAZOLE 100 MG PO TABS
200.0000 mg | ORAL_TABLET | Freq: Once | ORAL | Status: AC
  Administered 2015-03-04: 200 mg via ORAL

## 2015-03-04 MED ORDER — FLUCONAZOLE 100 MG PO TABS
100.0000 mg | ORAL_TABLET | Freq: Every day | ORAL | Status: DC
  Administered 2015-03-05: 100 mg via ORAL
  Filled 2015-03-04: qty 1

## 2015-03-04 NOTE — Progress Notes (Signed)
PCCM PROGRESS NOTE  Date of Admission: 02/23/2015 Date of Consult: 02/24/2015 Referring Provider: Dr. Blaine Hamper, [facility]  Chief Complaint: Short of breath  Cultures: 1/17 Sputum >> oral flora 1/18 BAL >> 1/18 BAL AFB >> 1/18 BAL Fungal >>  Antibiotics: 1/14 Vancomycin >> 1/20 1/14 Cefepime >>  Studies: 1/16 CT chest >> segmental opacification of lingula, emphysema 1/18 Bronchoscopy >> NSCLC (Squamous cell)  Events: 1/14 Admit 1/18 Bronchoscopy >> hypoxia/hypercapnia post procedure >> to SDU 1/20 Oncology consulted - T2NO MO per Onc - no chemo, No XRT, DNR, DNI -> after d./w Dr Lucas Mallow due to medical issues, poor ECOG and bad copd   Subjective:   03/04/2015  - hoarse voice complaint. Feels she has recurrent thrush. Still dyspneic with talking. Says she wants comfort and wants to improve quality of life with less dyspnea for remainder of natureal life. Appears open to hospice concent but wants to hear more about life expectancy, discharge date and symptom mgmt and hospice concetp from palliative care   Vital gns: BP 117/71 mmHg  Pulse 95  Temp(Src) 98.1 F (36.7 C) (Oral)  Resp 20  Ht '5\' 5"'$  (1.651 m)  Wt 90.992 kg (200 lb 9.6 oz)  BMI 33.38 kg/m2  SpO2 93%  General: pleasant, no distress Neuro: alert, normal strength HEENT: no sinus tenderness. HOARSE VOICE. ORAL THRUSH +. Cardiac: regular Chest: dyspneic taliing. Purse lip breathing +. O2 +. Intermittent wheze + - but not in resp exremis Abd: soft, non tender Ext: no edema  CMP Latest Ref Rng 02/28/2015 02/27/2015 02/26/2015  Glucose 65 - 99 mg/dL 218(H) 128(H) -  BUN 6 - 20 mg/dL 32(H) 26(H) -  Creatinine 0.44 - 1.00 mg/dL 0.73 0.75 0.74  Sodium 135 - 145 mmol/L 143 142 -  Potassium 3.5 - 5.1 mmol/L 4.5 4.4 -  Chloride 101 - 111 mmol/L 103 100(L) -  CO2 22 - 32 mmol/L 32 32 -  Calcium 8.9 - 10.3 mg/dL 8.9 8.8(L) -  Total Protein 6.5 - 8.1 g/dL - - -  Total Bilirubin 0.3 - 1.2 mg/dL - - -  Alkaline Phos 38 - 126 U/L -  - -  AST 15 - 41 U/L - - -  ALT 14 - 54 U/L - - -    CBC Latest Ref Rng 02/28/2015 02/27/2015 02/24/2015  WBC 4.0 - 10.5 K/uL 8.9 11.3(H) 6.3  Hemoglobin 12.0 - 15.0 g/dL 10.5(L) 11.2(L) 10.8(L)  Hematocrit 36.0 - 46.0 % 35.7(L) 38.9 37.4  Platelets 150 - 400 K/uL 207 242 212    No results found. CDiscussion: 70 y/o female former smoker was in hospital in December 2016 with pneumonia, presented with progressive dyspnea, cough, and sputum.  She has persistent/progressive lingular infiltrate >> bronchoscopy results consistent with NSCLC (Squamous cell). T2N0MO  She is followed by [name] for COPD and chronic hypoxic respiratory failure.  She also has hx of chronic diastolic CHF, A fib, HLD, HTN, non ischemic CM, GERD, OCD.  She has new oral thrush  Assessment/Plan:  COPD exacerbation. - continue brovana, spiriva - wean off steroids as tolerated - total 2 week course  - prn BDs - Abx per primary team >> will d/c vancomycin 1/20 - bronchial hygiene  Acute on chronic respiratory failure with hypoxia. - adjust oxygen to keep SpO2 90 to 95%  Persistent lingular infiltrate 2nd to NSCLC (Squamous cell). - palliative goal based on onc notes  - iinterested in hospice - wants to d/w palliative care T2N0MO - likely qualifuies on basis  of copd   Thrush - new fluconazole 200 mg loading dose, followed by 100 mg daily for 7days    Dyspnea  - cntinue opioid but consider morphine    Future Appointments Date Time Provider [place]  03/12/2015 1:30 PM Rigoberto Noel, MD [facility] None     PCCM will sign off   Dr. Brand Males, M.D., [facility].C.P Pulmonary and Critical Care Medicine Staff Physician [place] [facility] Pager: [phone], If no answer or between  15:00h - 7:00h: call [phone]  03/04/2015 9:44 AM

## 2015-03-04 NOTE — Progress Notes (Signed)
Occupational Therapy Treatment Patient Details Name: Kelsey Garcia MRN: [id number] DOB: 22-Feb-1945 Today's Date: 03/04/2015    History of present illness 70 y.o. female with PMH of COPD on 2 L oxygen at home, atrial fibrillation (patient refused anticoagulant), hyperlipidemia, GERD, depression, anxiety, mitral regurgitation, diastolic congestive heart failure, DJD, OCD, who presents with productive cough, shortness of breath and wheezing. Recently hospitalized several times last year for same.        Follow Up Recommendations  Home health OT (pt would benefit from [facility] to reinterated energy conservation in her home environment)    Equipment Recommendations  None recommended by OT    Recommendations for Other Services      Precautions / Restrictions Precautions Precautions: Fall Precaution Comments: O2/ monitor sats       Mobility Bed Mobility Overal bed mobility: Modified Independent                Transfers Overall transfer level: Needs assistance Equipment used: None Transfers: Sit to/from [facility] Sit to Stand: Supervision Stand pivot transfers: Supervision       General transfer comment: for lines/safety, BSC to bed        ADL                       Lower Body Dressing: Supervision/safety;Sit to/from stand;Cueing for sequencing;Cueing for safety   Toilet Transfer: Supervision/safety;BSC;Stand-pivot;Cueing for safety   Toileting- Clothing Manipulation and Hygiene: Sit to/from stand;Set up;Cueing for safety         General ADL Comments: reinterated energy conservation strategies.  Pt voiced understanding but not at all times willing to pace her self. Explained importance of pacing self. Pt state she understands but that it is hard to do.        Cognition   Behavior During Therapy: WFL for tasks assessed/performed Overall Cognitive Status: Within Functional Limits for tasks assessed                                    Pertinent Vitals/ Pain       Pain Assessment: No/denies pain            Progress Toward Goals  OT Goals(current goals can now be found in the care plan section)  Progress towards OT goals: Progressing toward goals     Plan Discharge plan remains appropriate;Discharge plan needs to be updated    Co-evaluation                 End of Session     Activity Tolerance Patient tolerated treatment well   Patient Left in bed;with call bell/phone within reach   Nurse Communication Mobility status        Time: 1130-1157 OT Time Calculation (min): 27 min  Charges: OT General Charges $OT Visit: 1 Procedure OT Treatments $Self Care/Home Management : 23-37 mins  Teara Duerksen, Thereasa Parkin 03/04/2015, 12:04 PM

## 2015-03-04 NOTE — Progress Notes (Signed)
[facility] PROGRESS NOTE  CHAITRA MAST YIF:[id number] DOB: [birth year] DOA: 02/23/2015 PCP: Darden Amber, PA  HPI/Brief narrative 70 y.o. female with PMH of COPD on 2 L oxygen at home, atrial fibrillation (patient refused anticoagulant), hyperlipidemia, GERD, depression, anxiety, mitral regurgitation, diastolic congestive heart failure, DJD, OCD, who presents with productive cough, shortness of breath and wheezing. Recently hospitalized several times last year for same.   Assessment/Plan: Acute on chronic respiratory failure ([place]) due to COPD exacerbation / HCAP  - CXR has persistent lingula lesion - CTA-chest on 01/21/15 without mass or PE.  - continued on IV steroids, antibiotics, nebulizers - Continued on Brovana, continue to monitor respiratory status. - Pulmonary had been following, now signed off - Patient underwent bronchoscopy on 1/18, see below for findings - On 2LNC this AM.   Essential hypertension - Cont cardizem, metoprolol - Cont on lasix as tolerated - Controlled currently thus far, no changes in the current regimen  GERD - Protonix  Depression and anxiety  - Thus far stable, no suicidal or homicidal ideations. - Continue home medications: BuSpar, Prozac  PAF: CHA2DS2-VASc Score is 4, needs oral anticoagulation. Heart rate is well controlled. Patient refused anticoagulation. Patient is currently preferring to be on aspirin - ASA and Cardizem - Rate controlled, telemetry reviewed 1/17, heart rates with adequate control   Chronic diastolic (congestive) heart failure ([place])  - 2-D echo on 03/07/14 showed EF 50-60%. Patient does not have leg edema, CHF remains compensated. - Continue aspirin, metoprolol and home dose Lasix  Squamous Cell Lung CA - Cytology washings pos for NSCLC - Pulm had consulted Oncology - Given COPD, doubt will be good candidate for surgery - MRI brain and CT abd neg for metastatic disease - Palliative Care was consulted. Appreciate  input. Pain and anxiety med changes noted. Remains full code at this time - Palliative Care to follow up today  Code Status: Full Family Communication: Pt in room Disposition Plan: Possible d/c home in 24hrs, pending input from Palliative Care   Consultants:  Pulmonary  Oncology  Palliative Care  Procedures:  Bronchoscopy 1/18  Antibiotics: Anti-infectives    Start     Dose/Rate Route Frequency Ordered Stop   03/05/15 1000  fluconazole (DIFLUCAN) tablet 100 mg     100 mg Oral Daily 03/04/15 0935 03/12/15 0959   03/04/15 1000  fluconazole (DIFLUCAN) tablet 200 mg     200 mg Oral  Once 03/04/15 0935 03/04/15 1059   02/24/15 1200  vancomycin (VANCOCIN) IVPB 1000 mg/200 mL premix  Status:  Discontinued     1,000 mg 200 mL/hr over 60 Minutes Intravenous Every 12 hours 02/24/15 0004 03/01/15 0905   02/23/15 2359  ceFEPIme (MAXIPIME) 1 g in dextrose 5 % 50 mL IVPB     1 g 100 mL/hr over 30 Minutes Intravenous Every 8 hours 02/23/15 2308 03/02/15 2359   02/23/15 2245  vancomycin (VANCOCIN) IVPB 1000 mg/200 mL premix     1,000 mg 200 mL/hr over 60 Minutes Intravenous  Once 02/23/15 2243 02/24/15 0039      HPI/Subjective: No complaints today  Objective: Filed Vitals:   03/03/15 2001 03/03/15 2045 03/04/15 0505 03/04/15 1045  BP:  122/63 117/71   Pulse:  91 95   Temp:  98.1 F (36.7 C) 98.1 F (36.7 C)   TempSrc:  Oral Oral   Resp:  20 20   Height:      Weight:   90.992 kg (200 lb 9.6 oz)   SpO2: 98%  95% 93% 97%    Intake/Output Summary (Last 24 hours) at 03/04/15 1440 Last data filed at 03/04/15 0800  Gross per 24 hour  Intake    360 ml  Output      0 ml  Net    360 ml   Filed Weights   03/02/15 1645 03/03/15 0535 03/04/15 0505  Weight: 84.2 kg (185 lb 10 oz) 89.54 kg (197 lb 6.4 oz) 90.992 kg (200 lb 9.6 oz)    Exam:   General:  Awake, sitting in chair  Cardiovascular: regular, s1, s2  Respiratory: normal resp effort, decreased breathsounds  throughout, no audible wheezing  Abdomen: soft,nondistended, pos BS  Musculoskeletal: perfused,no clubbing  Data Reviewed: Basic Metabolic Panel:  Recent Labs Lab 02/26/15 1055 02/27/15 0650 02/28/15 0337  NA  --  142 143  K  --  4.4 4.5  CL  --  100* 103  CO2  --  32 32  GLUCOSE  --  128* 218*  BUN  --  26* 32*  CREATININE 0.74 0.75 0.73  CALCIUM  --  8.8* 8.9   Liver Function Tests: No results for input(s): AST, ALT, ALKPHOS, BILITOT, PROT, ALBUMIN in the last 168 hours. No results for input(s): LIPASE, AMYLASE in the last 168 hours. No results for input(s): AMMONIA in the last 168 hours. CBC:  Recent Labs Lab 02/27/15 0650 02/28/15 0337  WBC 11.3* 8.9  HGB 11.2* 10.5*  HCT 38.9 35.7*  MCV 98.5 98.6  PLT 242 207   Cardiac Enzymes: No results for input(s): CKTOTAL, CKMB, CKMBINDEX, TROPONINI in the last 168 hours. BNP (last 3 results)  Recent Labs  01/30/15 0625 02/04/15 1142 02/24/15 0524  BNP 181.4* 217.6* 306.5*    ProBNP (last 3 results) No results for input(s): PROBNP in the last 8760 hours.  CBG: No results for input(s): GLUCAP in the last 168 hours.  Recent Results (from the past 240 hour(s))  Culture, expectorated sputum-assessment     Status: None   Collection Time: 02/26/15  1:00 AM  Result Value Ref Range Status   Specimen Description SPUTUM  Final   Special Requests NONE  Final   Sputum evaluation   Final    THIS SPECIMEN IS ACCEPTABLE. RESPIRATORY CULTURE REPORT TO FOLLOW.   Report Status 02/26/2015 FINAL  Final  Culture, respiratory (NON-Expectorated)     Status: None   Collection Time: 02/26/15  1:00 AM  Result Value Ref Range Status   Specimen Description SPU  Final   Special Requests NONE  Final   Gram Stain   Final    ABUNDANT WBC PRESENT, PREDOMINANTLY PMN RARE SQUAMOUS EPITHELIAL CELLS PRESENT FEW YEAST Performed at [facility]    Culture   Final    NORMAL OROPHARYNGEAL FLORA Performed at [facility]    Report Status 02/28/2015 FINAL  Final  Culture, bal-quantitative     Status: None   Collection Time: 02/27/15  8:58 AM  Result Value Ref Range Status   Specimen Description BRONCHIAL ALVEOLAR LAVAGE  Final   Special Requests NONE  Final   Gram Stain   Final    ABUNDANT WBC PRESENT,BOTH PMN AND MONONUCLEAR MODERATE SQUAMOUS EPITHELIAL CELLS PRESENT RARE YEAST RARE GRAM POSITIVE COCCI IN PAIRS    Colony Count   Final    80,000 COLONIES/ML Performed at [facility]    Culture   Final    NORMAL OROPHARYNGEAL FLORA Performed at [facility]    Report Status 03/01/2015 FINAL  Final  AFB culture with smear     Status: None (Preliminary result)   Collection Time: 02/27/15  8:58 AM  Result Value Ref Range Status   Specimen Description BRONCHIAL ALVEOLAR LAVAGE  Final   Special Requests NONE  Final   Acid Fast Smear   Final    NO ACID FAST BACILLI SEEN Performed at [facility]    Culture   Final    CULTURE WILL BE EXAMINED FOR 6 WEEKS BEFORE ISSUING A FINAL REPORT Performed at [facility]    Report Status PENDING  Incomplete  Fungus Culture with Smear     Status: None (Preliminary result)   Collection Time: 02/27/15  8:58 AM  Result Value Ref Range Status   Specimen Description BRONCHIAL ALVEOLAR LAVAGE  Final   Special Requests NONE  Final   Fungal Smear RARE YEAST Performed at [facility]   Final   Culture   Final    CANDIDA ALBICANS Performed at [facility]    Report Status PENDING  Incomplete  MRSA PCR Screening     Status: None   Collection Time: 02/27/15 11:15 AM  Result Value Ref Range Status   MRSA by PCR NEGATIVE NEGATIVE Final    Comment:        The GeneXpert MRSA Assay (FDA approved for NASAL specimens only), is one component of a comprehensive MRSA colonization surveillance program. It is not intended to diagnose MRSA infection nor to guide or monitor treatment for MRSA infections.       Studies: No results found.  Scheduled Meds: . arformoterol  15 mcg Nebulization BID  . aspirin  650 mg Oral Daily  . benzonatate  100 mg Oral Once  . busPIRone  10 mg Oral TID  . dextromethorphan-guaiFENesin  1 tablet Oral BID  . diltiazem  120 mg Oral 3 times per day  . [START ON 03/05/2015] fluconazole  100 mg Oral Daily  . FLUoxetine  20 mg Oral Daily  . furosemide  40 mg Oral Daily  . heparin  5,000 Units Subcutaneous 3 times per day  . methylPREDNISolone (SOLU-MEDROL) injection  40 mg Intravenous Q12H  . metoprolol succinate  25 mg Oral Daily  . pantoprazole  40 mg Oral Daily  . sodium chloride  3 mL Intravenous Q12H  . tiotropium  18 mcg Inhalation Daily   Continuous Infusions: . sodium chloride 10 mL/hr at 02/28/15 1000    Principal Problem:   Acute on chronic respiratory failure ([facility]) Active Problems:   HLD (hyperlipidemia)   Depression with anxiety   Essential hypertension   GERD (gastroesophageal reflux disease)   Osteoporosis   COPD exacerbation ([facility])   Atrial fibrillation ([facility])   Chronic diastolic (congestive) heart failure ([facility])   Lobar pneumonia due to unspecified organism   Chronic obstructive pulmonary disease with acute exacerbation ([facility])   HCAP (healthcare-associated pneumonia)   Lingular pneumonia   S/P bronchoscopy with biopsy   Primary lung cancer ([place])   Palliative care encounter   Pain in joint, pelvic region and thigh   Anxiety state   Squamous cell lung cancer ([place])   Frederica Chrestman, [facility] Pager [phone]. If 7PM-7AM, please contact night-coverage at [email or web address], password [facility] 03/04/2015, 2:40 PM  LOS: 9 days

## 2015-03-04 NOTE — Progress Notes (Signed)
Radiation Oncology         ([phone]) [phone] ________________________________  Initial inpatient Consultation  Name: Kelsey Garcia MRN: [id number]  Date: 02/23/2015  DOB: August 03, 1945  XU:XYBFX,OVANVB M, PA  No ref. provider found   REFERRING PHYSICIAN: Dr. 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 female with numerous comorbidities and recent smoking history  who presented with recurrent hospitalizations since last year and episodes of respiratory distress, coughing, wheezing.  A persistent Left lingular lesion on CT imaging of the chest was worked up with bronchoscopy on 02-27-15.  At the time of bronchoscopy, some irregular nodules were appreciated around the lingular region.  Bronchoalveolar lavage was positive for squamous cell carcinoma.  .  No hemoptysis.  Not a candidate for chemotherapy per Dr. Alvy Bimler.  Not a surgical candidate in the opinion of Dr. Alvy Bimler.   ECOG 3.  Lives with daughter and granddaughter.  Has financial and trasportation challenges.  Quit smoking in Oct 2016.    Reports Right should pain radiating to mid right arm and neck x 1 year.  Intential weight loss over 3 years  (60lb). No dysphagia.  + GERD.    PREVIOUS RADIATION THERAPY: No  PAST MEDICAL HISTORY:  has a past medical history of Atrial fibrillation ([place]); Hypertension; Insomnia; Nonischemic cardiomyopathy ([place]); Hyperlipidemia; Anxiety; Cholelithiasis; Insomnia; Mitral regurgitation; H/O epistaxis; Rhinitis, allergic; CHF (congestive heart failure) ([place]); Pneumonia; COPD (chronic obstructive pulmonary disease) ([place]); Chronic bronchitis with COPD (chronic obstructive pulmonary disease) ([place]); Shortness of breath; GERD (gastroesophageal reflux disease); Headache(784.0); Migraines; DJD (degenerative joint disease); Arthritis; OCD (obsessive compulsive disorder); OCD (obsessive compulsive disorder); Depression; Thoracic  vertebral fracture ([place]) (11/03/2014); and Primary lung cancer ([place]).    PAST SURGICAL HISTORY: Past Surgical History  Procedure Laterality Date  . Tubal ligation  1972  . Cardioversion  2003; 07/2003  . Video bronchoscopy Bilateral 02/27/2015    Procedure: VIDEO BRONCHOSCOPY WITH FLUORO;  Surgeon: Chesley Mires, MD;  Location: [facility];  Service: Cardiopulmonary;  Laterality: Bilateral;    FAMILY HISTORY: family history includes COPD in her mother; Cirrhosis in her father; Emphysema in her mother.  SOCIAL HISTORY:  reports that she quit smoking about 8 months ago. Her smoking use included Cigarettes. She has a 35 pack-year smoking history. She has never used smokeless tobacco. She reports that she does not drink alcohol or use illicit drugs.  ALLERGIES: Ace inhibitors; Pseudoeph-doxylamine-dm-apap; Diphenhydramine hcl; Erythromycin; Nsaids; Nyquil; Tramadol hcl; Codeine; Fosamax; Tomato; Orange fruit; and Pseudoephedrine  MEDICATIONS:  Current Facility-Administered Medications  Medication Dose Route Frequency Provider Last Rate Last Dose  . 0.9 %  sodium chloride infusion   Intravenous Continuous Chesley Mires, MD 10 mL/hr at 02/28/15 1000    . albuterol (PROVENTIL) (2.5 MG/3ML) 0.083% nebulizer solution 3 mL  3 mL Inhalation Q4H PRN Caren Griffins, MD   3 mL at 03/02/15 1940  . arformoterol (BROVANA) nebulizer solution 15 mcg  15 mcg Nebulization BID Deneise Lever, MD   15 mcg at 03/04/15 1044  . aspirin EC tablet 650 mg  650 mg Oral Daily Ivor Costa, MD   650 mg at 03/04/15 1059  . benzonatate (TESSALON) capsule 100 mg  100 mg Oral Once Dianne Dun, NP   100 mg at 03/02/15 2100  . busPIRone (BUSPAR) tablet 10 mg  10 mg Oral TID Ivor Costa, MD   10 mg at 03/04/15 1058  .  dextromethorphan-guaiFENesin (MUCINEX DM) 30-600 MG per 12 hr tablet 1 tablet  1 tablet Oral BID Ivor Costa, MD   1 tablet at 03/04/15 1058  . diazepam (VALIUM) tablet 2 mg  2 mg Oral Q6H PRN Melton Alar,  PA-C   2 mg at 03/02/15 1005  . diltiazem (CARDIZEM) tablet 120 mg  120 mg Oral 3 times per day Ivor Costa, MD   120 mg at 03/04/15 0450  . [START ON 03/05/2015] fluconazole (DIFLUCAN) tablet 100 mg  100 mg Oral Daily Brand Males, MD      . FLUoxetine (PROZAC) capsule 20 mg  20 mg Oral Daily Ivor Costa, MD   20 mg at 03/04/15 1059  . furosemide (LASIX) tablet 40 mg  40 mg Oral Daily Ivor Costa, MD   40 mg at 03/04/15 1058  . heparin injection 5,000 Units  5,000 Units Subcutaneous 3 times per day Ivor Costa, MD   5,000 Units at 03/04/15 0451  . HYDROcodone-acetaminophen (NORCO) 10-325 MG per tablet 1 tablet  1 tablet Oral Q6H PRN Melton Alar, PA-C   1 tablet at 03/04/15 1119  . HYDROcodone-homatropine (HYCODAN) 5-1.5 MG/5ML syrup 2.5 mL  2.5 mL Oral BID PRN Ivor Costa, MD   2.5 mL at 03/03/15 2120  . HYDROmorphone (DILAUDID) injection 0.5-1 mg  0.5-1 mg Intravenous Q3H PRN Bobby Rumpf [place], PA-C      . methylPREDNISolone sodium succinate (SOLU-MEDROL) 40 mg/mL injection 40 mg  40 mg Intravenous Q12H Chesley Mires, MD   40 mg at 03/04/15 1057  . metoprolol succinate (TOPROL-XL) 24 hr tablet 25 mg  25 mg Oral Daily Ivor Costa, MD   25 mg at 03/04/15 1058  . nystatin (MYCOSTATIN) 100000 UNIT/ML suspension 500,000 Units  5 mL Oral Daily PRN Ivor Costa, MD   500,000 Units at 03/02/15 2120  . ondansetron (ZOFRAN) tablet 4 mg  4 mg Oral Q6H PRN Ivor Costa, MD       Or  . ondansetron [facility]) injection 4 mg  4 mg Intravenous Q6H PRN Ivor Costa, MD   4 mg at 02/23/15 2335  . pantoprazole (PROTONIX) EC tablet 40 mg  40 mg Oral Daily Ivor Costa, MD   40 mg at 03/04/15 1058  . sodium chloride 0.9 % injection 3 mL  3 mL Intravenous Q12H Ivor Costa, MD   3 mL at 03/04/15 1057  . tiotropium (SPIRIVA) inhalation capsule 18 mcg  18 mcg Inhalation Daily Ivor Costa, MD   18 mcg at 03/04/15 1044    REVIEW OF SYSTEMS:  Notable for that above.   PHYSICAL EXAM:  height is '5\' 5"'$  (1.651 m) and weight is 200 lb 9.6 oz (90.992  kg). Her oral temperature is 98.3 F (36.8 C). Her blood pressure is 129/76 and her pulse is 103. Her respiration is 20 and oxygen saturation is 96%.   General: Alert and oriented, in no acute distress; dyspneic, O2 per [place]. HEENT: Head is normocephalic. White spots on tongue - pt  is getting tx for thrush. Neck: Neck is supple, no palpable cervical or supraclavicular lymphadenopathy. Heart: tachycardic with soft murmur Chest: decreased breath sounds bilaterally Abdomen: Soft, nontender, nondistended, with no rigidity or guarding. Extremities: ankle edema bilaterally, slightly pitting Lymphatics: see Neck Exam Skin: ecchymoses on arms Musculoskeletal: symmetric strength and muscle tone throughout. Neurologic: Cranial nerves II through XII are grossly intact. No obvious focalities. Speech is fluent.  Psychiatric: Judgment and insight are intact. Affect is appropriate.    ECOG =  3  0 - Asymptomatic (Fully active, able to carry on all predisease activities without restriction)  1 - Symptomatic but completely ambulatory (Restricted in physically strenuous activity but ambulatory and able to carry out work of a light or sedentary nature. For example, light housework, office work)  2 - Symptomatic, <50% in bed during the day (Ambulatory and capable of all self care but unable to carry out any work activities. Up and about more than 50% of waking hours)  3 - Symptomatic, >50% in bed, but not bedbound (Capable of only limited self-care, confined to bed or chair 50% or more of waking hours)  4 - Bedbound (Completely disabled. Cannot carry on any self-care. Totally confined to bed or chair)  5 - Death   Eustace Pen MM, Creech RH, Tormey DC, et al. [phone]). "Toxicity and response criteria of the [employer]". [place] Oncol. 5 (6): 649-55   LABORATORY DATA:  Lab Results  Component Value Date   WBC 8.9 02/28/2015   HGB 10.5* 02/28/2015   HCT 35.7* 02/28/2015   MCV 98.6  02/28/2015   PLT 207 02/28/2015   CMP     Component Value Date/Time   NA 143 02/28/2015 0337   K 4.5 02/28/2015 0337   CL 103 02/28/2015 0337   CO2 32 02/28/2015 0337   GLUCOSE 218* 02/28/2015 0337   BUN 32* 02/28/2015 0337   CREATININE 0.73 02/28/2015 0337   CREATININE 0.71 06/15/2014 1008   CALCIUM 8.9 02/28/2015 0337   PROT 6.6 02/24/2015 0524   ALBUMIN 3.5 02/24/2015 0524   AST 17 02/24/2015 0524   ALT 13* 02/24/2015 0524   ALKPHOS 98 02/24/2015 0524   BILITOT 0.7 02/24/2015 0524   GFRNONAA >60 02/28/2015 0337   GFRNONAA 66 10/12/2013 1516   GFRAA >60 02/28/2015 0337   GFRAA 76 10/12/2013 1516      Pulmonary Functions Testing Results:  No results found for: FEV1, FVC, FEV1FVC, TLC, DLCO    RADIOGRAPHY: Dg Chest 2 View  02/23/2015  CLINICAL DATA:  Shortness of breath and cough EXAM: CHEST  2 VIEW COMPARISON:  Chest radiograph February 04, 2015; chest CT January 30, 2015 FINDINGS: There is persistent airspace consolidation in the superior lingular region. No new opacity is evident. Heart size and pulmonary vascularity are normal. No adenopathy. No bone lesions. IMPRESSION: Persistent infiltrate in the superior lingula. No new opacity. This finding in the lingula warrants additional imaging surveillance in approximately 10-14 days. If opacity persists in this area at that time, it would be reasonable to consider bronchoscopy to further assess this region, in particular to assess for possible underlying neoplastic focus or localized endobronchial lesion. Electronically Signed   By: Lowella Grip III M.D.   On: 02/23/2015 19:24   Dg Chest 2 View  02/04/2015  CLINICAL DATA:  Pt here with c/o SOB, nausea since yesterday. Hx chronic SOB, COPD, pna recently, CHF, a-fib, HTN, ex-smoker EXAM: CHEST - 2 VIEW COMPARISON:  01/30/2015 FINDINGS: Progressive patchy airspace disease in the lingula. Mild pulmonary vascular congestion and diffuse interstitial prominence, slightly increased  from previous. Heart size upper limits normal. Tortuous atheromatous aorta. No effusion.  No pneumothorax. Visualized skeletal structures are unremarkable. IMPRESSION: 1. Some increase in bilateral interstitial edema or infiltrate with progressive lingular airspace disease suggesting pneumonia. Electronically Signed   By: Lucrezia Europe M.D.   On: 02/04/2015 11:31   Ct Chest Wo Contrast  02/25/2015  CLINICAL DATA:  Progressive dyspnea with cough. EXAM: CT CHEST  WITHOUT CONTRAST TECHNIQUE: Multidetector CT imaging of the chest was performed following the standard protocol without IV contrast. COMPARISON:  01/30/2015. FINDINGS: Mediastinum / Lymph Nodes: There is no axillary lymphadenopathy. 9 mm short axis precarinal lymph node is stable. No mediastinal lymphadenopathy. No definite hilar lymphadenopathy although assessment is limited by the lack of intravenous contrast material. The heart is enlarged. Coronary artery calcification is noted. The esophagus has normal imaging features. Lungs / Pleura: Emphysema noted bilaterally. Segmental opacification in the lingula persists without substantial change. The no evidence for pulmonary edema or pleural effusion. Upper Abdomen:  Calcified gallstones measure up to 18 mm diameter. MSK / Soft Tissues: Bone windows reveal no worrisome lytic or sclerotic osseous lesions. Compression deformity again noted at T5 and T6. IMPRESSION: Stable appearance of confluent consolidation in the lingula. Given the persistence, neoplasm be considered. PET-CT may prove helpful to further evaluate. Compression fractures T5 and T6. Cholelithiasis. Electronically Signed   By: Misty Stanley M.D.   On: 02/25/2015 13:39   Mr Jeri Cos LZ Contrast  03/01/2015  CLINICAL DATA:  Squamous cell lung cancer staging EXAM: MRI HEAD WITHOUT AND WITH CONTRAST TECHNIQUE: Multiplanar, multiecho pulse sequences of the brain and surrounding structures were obtained without and with intravenous contrast. CONTRAST:   40m MULTIHANCE GADOBENATE DIMEGLUMINE 529 MG/ML IV SOLN COMPARISON:  CT head 07/15/2014. FINDINGS: Image quality degraded by motion. Initially unenhanced scan was performed. After further communication, the patient returned for postcontrast imaging to rule out metastatic disease. Ventricle size normal.  Cerebral volume normal for age. Negative for acute infarct. Scattered small white matter hyperintensities. Moderate hyperintensity throughout the pons. Negative for intracranial hemorrhage. Negative for mass or edema. No shift of the midline structures. Postcontrast imaging degraded by motion. No enhancing metastatic deposits identified. Paranasal sinuses clear. No orbital mass. No skull lesion identified. IMPRESSION: Image quality degraded by motion. Negative for metastatic disease Mild chronic microvascular ischemic change in the white matter. Moderate chronic changes in the pons. Electronically Signed   By: CFranchot GalloM.D.   On: 03/01/2015 06:38   Ct Abdomen Pelvis W Contrast  02/28/2015  CLINICAL DATA:  70year old female with history of lung cancer presenting with productive cough and shortness of breath and wheezing. Metastatic survey. EXAM: CT ABDOMEN AND PELVIS WITH CONTRAST TECHNIQUE: Multidetector CT imaging of the abdomen and pelvis was performed using the standard protocol following bolus administration of intravenous contrast. CONTRAST:  562mOMNIPAQUE IOHEXOL 300 MG/ML SOLN, 10029mMNIPAQUE IOHEXOL 300 MG/ML SOLN COMPARISON:  CT dated 07/05/2014 FINDINGS: Partially visualized subsegmental densities at the right lung base and lingula likely represent atelectatic changes. There is emphysematous changes of the lung bases. There is coronary vascular calcification. No intra-abdominal free air or free fluid. The gallbladder is filled with stones. A stone is noted in the neck of the gallbladder. No pericholecystic fluid. The liver, pancreas, spleen, and the adrenal glands appear unremarkable. There is  no hydronephrosis on either side. The visualized ureters and urinary bladder appear unremarkable. The uterus is grossly unremarkable. There is a mildly dilated segment of proximal small bowel measuring up to 4 cm in diameter. No transition zone identified. The oral contrast traverses into the distal small bowel without evidence of obstruction. This likely represents a segmental ileus. There is sigmoid diverticulosis without active inflammatory changes. Copious amount of dense stool noted throughout the colon. No evidence of bowel obstruction or inflammation. Normal appendix. There is aortoiliac atherosclerotic disease. There is ectasia of the infrarenal abdominal aorta measuring up to  2.8 cm in diameter. The origins of the celiac axis, SMA, IMA as well as the origins of the renal arteries are patent. No portal venous gas identified. There is no adenopathy. There is diffuse subcutaneous soft tissue stranding and edema. No drainable fluid collection or hematoma. Osteopenia with degenerative changes of the spine. No acute fracture. IMPRESSION: No CT evidence of intra-abdominal or pelvic metastatic disease. Cholelithiasis. Constipation. No evidence of bowel obstruction or inflammation. Normal appendix. Sigmoid diverticulosis. Electronically Signed   By: Anner Crete M.D.   On: 02/28/2015 23:58   Dg Chest Port 1 View  02/27/2015  CLINICAL DATA:  Status post bronchoscopy EXAM: PORTABLE CHEST 1 VIEW COMPARISON:  Chest radiograph February 23, 2015; chest CT February 25 2015 FINDINGS: There is no apparent pneumothorax. The ill-defined opacity in the superior lingula remains. There there is slight generalized interstitial edema. Heart is borderline enlarged with mild pulmonary venous hypertension. No adenopathy apparent. IMPRESSION: Persistent ill-defined opacity superior lingula. Evidence of a mild degree of congestive heart failure. No pneumothorax. Electronically Signed   By: Lowella Grip III M.D.   On:  02/27/2015 09:37   Dg C-arm Bronchoscopy  02/27/2015  CLINICAL DATA:  C-ARM BRONCHOSCOPY Fluoroscopy was utilized by the requesting physician.  No radiographic interpretation.      IMPRESSION/PLAN: Today, I talked to the patient about the findings and work-up thus far. We discussed the patient's diagnosis of lung cancer and general treatment for this, highlighting the role of radiotherapy in the management. We discussed the available radiation techniques, and focused on the details of logistics and delivery.    Chemotherapy and surgery would be extremely risky given her comorbidities.  We talked about hospice (though I think she would live >6 months with treatment), versus a palliative course x 2 weeks versus working up her cancer further with a PET to properly stage it and determine the appropriate curative technique.  She would like to proceed with PET and then RT.  I will order this to be done post discharge and then see her back in my office to discuss results and plan RT.  I will ask Dr Wyline Copas or any hospitalist to order baseline PFTs prior to discharge.  We discussed the risks, benefits, and side effects of radiotherapy. Side effects may include but not necessarily be limited to: skin irritation, fatigue, lung or heart injury, esophageal injury. No guarantees of treatment were given. A consent form was signed and placed in the patient's medical record.  The patient was encouraged to ask questions that I answered to the best of my ability.      __________________________________________   Eppie Gibson, MD

## 2015-03-05 ENCOUNTER — Inpatient Hospital Stay (HOSPITAL_COMMUNITY): Payer: Medicare Other

## 2015-03-05 DIAGNOSIS — C3410 Malignant neoplasm of upper lobe, unspecified bronchus or lung: Secondary | ICD-10-CM

## 2015-03-05 LAB — PULMONARY FUNCTION TEST
DL/VA % pred: 61 %
DL/VA: 3.01 ml/min/mmHg/L
DLCO COR % PRED: 21 %
DLCO COR: 5.34 ml/min/mmHg
DLCO UNC % PRED: 19 %
DLCO UNC: 4.8 ml/min/mmHg
FEF 25-75 POST: 0.36 L/s
FEF 25-75 PRE: 0.33 L/s
FEF2575-%Change-Post: 8 %
FEF2575-%PRED-PRE: 16 %
FEF2575-%Pred-Post: 17 %
FEV1-%CHANGE-POST: 1 %
FEV1-%PRED-POST: 31 %
FEV1-%Pred-Pre: 30 %
FEV1-POST: 0.74 L
FEV1-Pre: 0.73 L
FEV1FVC-%Change-Post: 2 %
FEV1FVC-%PRED-PRE: 64 %
FEV6-%Change-Post: 2 %
FEV6-%PRED-POST: 48 %
FEV6-%Pred-Pre: 47 %
FEV6-PRE: 1.42 L
FEV6-Post: 1.46 L
FEV6FVC-%CHANGE-POST: 3 %
FEV6FVC-%PRED-POST: 103 %
FEV6FVC-%Pred-Pre: 99 %
FVC-%CHANGE-POST: 0 %
FVC-%Pred-Post: 47 %
FVC-%Pred-Pre: 47 %
FVC-PRE: 1.49 L
FVC-Post: 1.47 L
POST FEV1/FVC RATIO: 50 %
Post FEV6/FVC ratio: 99 %
Pre FEV1/FVC ratio: 49 %
Pre FEV6/FVC Ratio: 96 %
RV % pred: 101 %
RV: 2.21 L
TLC % PRED: 71 %
TLC: 3.68 L

## 2015-03-05 MED ORDER — FLUCONAZOLE 100 MG PO TABS: 100.0000 mg | ORAL_TABLET | Freq: Every day | ORAL | Status: DC

## 2015-03-05 MED ORDER — ALBUTEROL SULFATE (2.5 MG/3ML) 0.083% IN NEBU
2.5000 mg | INHALATION_SOLUTION | Freq: Once | RESPIRATORY_TRACT | Status: AC
  Administered 2015-03-05: 2.5 mg via RESPIRATORY_TRACT

## 2015-03-05 MED ORDER — HYDROCODONE-ACETAMINOPHEN 10-325 MG PO TABS: 1.0000 | ORAL_TABLET | Freq: Four times a day (QID) | ORAL | Status: DC | PRN

## 2015-03-05 MED ORDER — FLUOXETINE HCL 20 MG PO CAPS: 60.0000 mg | ORAL_CAPSULE | Freq: Every day | ORAL | Status: DC

## 2015-03-05 MED ORDER — DIAZEPAM 2 MG PO TABS: 2.0000 mg | ORAL_TABLET | Freq: Four times a day (QID) | ORAL | Status: DC | PRN

## 2015-03-05 MED ORDER — BENZONATATE 100 MG PO CAPS: 100.0000 mg | ORAL_CAPSULE | Freq: Once | ORAL | Status: DC

## 2015-03-05 MED ORDER — PREDNISONE 5 MG PO TABS
5.0000 mg | ORAL_TABLET | Freq: Every day | ORAL | Status: DC
Start: 2015-03-06 — End: 2015-04-07

## 2015-03-05 MED ORDER — HYDROCODONE-HOMATROPINE 5-1.5 MG/5ML PO SYRP: 2.5000 mL | ORAL_SOLUTION | Freq: Two times a day (BID) | ORAL | Status: DC | PRN

## 2015-03-05 NOTE — Care Management Important Message (Signed)
Important Message  Patient Details  Name: Kelsey Garcia MRN: [id number] Date of Birth: 1945-07-17   Medicare Important Message Given:  Yes    Camillo Flaming 03/05/2015, 11:10 AMImportant Message  Patient Details  Name: Kelsey Garcia MRN: [id number] Date of Birth: Apr 03, 1945   Medicare Important Message Given:  Yes    Camillo Flaming 03/05/2015, 11:10 AM

## 2015-03-05 NOTE — Progress Notes (Addendum)
Pt selected [place] for [facility] needs.  Referral given to in house rep.

## 2015-03-05 NOTE — Care Management Note (Signed)
Case Management Note  Patient Details  Name: Kelsey Garcia MRN: [id number] Date of Birth: 11-03-1945  Subjective/Objective:                    Action/Plan:   Expected Discharge Date:                  Expected Discharge Plan:  [place]  In-House Referral:     Discharge planning Services  CM Consult  Post Acute Care Choice:    Choice offered to:  Patient  DME Arranged:    DME Agency:     HH Arranged:  RN, PT [place] Agency:  [facility]  Status of Service:     Medicare Important Message Given:  Yes Date Medicare IM Given:    Medicare IM give by:    Date Additional Medicare IM Given:    Additional Medicare Important Message give by:     If discussed at [place] of Stay Meetings, dates discussed:    Additional CommentsPurcell Mouton, RN 03/05/2015, 2:01 PM

## 2015-03-05 NOTE — Progress Notes (Signed)
CSW received notification that pt needing ambulance transport to home, but will not be ready for transport until likely 6:30 pm.   CSW confirmed address and confirmed with RN that RN agreeable to arranging ambulance when pt ready this evening.   RN to call Non-emergency ambulance at [phone] option 1 for [place] and option 3 for non-emergency transport. Pt home address on face sheet correct for transport.   PTAR documents provided for transport and RN will call transport when pt ready.  No further socialw ork needs identified at this time.  CSW signing off.   Alison Murray, MSW, [place] Work [phone]

## 2015-03-05 NOTE — Discharge Summary (Signed)
Physician Discharge Summary  Kelsey Garcia EHU:[id number] DOB: 13-Nov-1945 DOA: 02/23/2015  PCP: Darden Amber, PA  Admit date: 02/23/2015 Discharge date: 03/05/2015  Time spent: 20 minutes  Recommendations for Outpatient Follow-up:  1. Follow up with PCP in 1-2 weeks 2. Follow up with Radiation Oncology as scheduled   Discharge Diagnoses:  Principal Problem:   Acute on chronic respiratory failure (HCC) Active Problems:   HLD (hyperlipidemia)   Depression with anxiety   Essential hypertension   GERD (gastroesophageal reflux disease)   Osteoporosis   COPD exacerbation (HCC)   Atrial fibrillation (HCC)   Chronic diastolic (congestive) heart failure (HCC)   Lobar pneumonia due to unspecified organism   Chronic obstructive pulmonary disease with acute exacerbation (HCC)   HCAP (healthcare-associated pneumonia)   Lingular pneumonia   S/P bronchoscopy with biopsy   Primary lung cancer ([place])   Palliative care encounter   Pain in joint, pelvic region and thigh   Anxiety state   Squamous cell lung cancer ([place])   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 Briefly, 70 y.o. female with PMH of COPD on 2 L oxygen at home, atrial fibrillation (patient refused anticoagulant), hyperlipidemia, GERD, depression, anxiety, mitral regurgitation, diastolic congestive heart failure, DJD, OCD, who presents with productive cough, shortness of breath and wheezing. Recently hospitalized several times last year for same.   Hospital Course:  Acute on chronic respiratory failure ([place]) due to COPD exacerbation / HCAP  - CXR has persistent lingula lesion - CTA-chest on 01/21/15 without mass or PE.  - continued on IV  steroids, antibiotics, nebulizers - While admitted, patient was continued on Brovana. - Pulmonary had been following and has since signed off - Patient underwent bronchoscopy on 1/18, see below for findings - On 2LNC this AM.   Essential hypertension - Cont cardizem, metoprolol - Cont on lasix as tolerated - Controlled currently thus far, no changes in the current regimen  GERD - Protonix  Depression and anxiety  - Thus far stable, no suicidal or homicidal ideations. - Continued home medications: BuSpar, Prozac  PAF: CHA2DS2-VASc Score is 4, needs oral anticoagulation. Heart rate is well controlled. Patient refused anticoagulation. Patient is currently preferring to be on aspirin - ASA and Cardizem - Rate controlled, telemetry reviewed 1/17, heart rates with adequate control   Chronic diastolic (congestive) heart failure ([place])  - 2-D echo on 03/07/14 showed EF 50-60%. Patient does not have leg edema, CHF remained compensated. - Continue aspirin, metoprolol and home dose Lasix  Squamous Cell Lung CA - Cytology washings pos for NSCLC - Pulm had consulted Oncology - Given COPD, doubt will be good candidate for surgery - MRI brain and CT abd neg for metastatic disease - Palliative Care was consulted. Appreciate input. Pain and anxiety med changes noted. Remains full code at this time - Patient was seen by Radiation Oncology with recs for outpatient PET and follow up RT, to be scheduled - Discussed with Dr. Alvy Bimler - no needs for further Oncology follow up   Consultations:  Pulmonary  Oncology  Palliative Care  Radiation Oncology  Discharge Exam: Filed Vitals:   03/05/15 0456 03/05/15 1015 03/05/15 1027 03/05/15 1341  BP: 133/74  135/72 136/77  Pulse: 92  90 104  Temp: 98.4 F (36.9 C)   97.8 F (36.6 C)  TempSrc: Oral   Oral  Resp: 20   20  Height:      Weight: 91.491 kg (201 lb 11.2 oz)     SpO2: 97% 95%  98%    General: Awake, in nad Cardiovascular:  regular, s1, s2 Respiratory: normal resp effort, no wheezing  Discharge Instructions     Medication List    STOP taking these medications        doxycycline 100 MG tablet  Commonly known as:  VIBRA-TABS     KLOR-CON M20 20 MEQ tablet  Generic drug:  potassium chloride SA     oxyCODONE-acetaminophen 5-325 MG tablet  Commonly known as:  PERCOCET/ROXICET      TAKE these medications        albuterol (2.5 MG/3ML) 0.083% nebulizer solution  Commonly known as:  PROVENTIL  Take 3 mLs (2.5 mg total) by nebulization every 6 (six) hours as needed for wheezing.     albuterol 108 (90 Base) MCG/ACT inhaler  Commonly known as:  PROVENTIL HFA;VENTOLIN HFA  Inhale 1-2 puffs into the lungs every 6 (six) hours as needed for wheezing.     aspirin 325 MG EC tablet  Take 650 mg by mouth daily.     benzonatate 100 MG capsule  Commonly known as:  TESSALON  Take 1 capsule (100 mg total) by mouth once.     busPIRone 10 MG tablet  Commonly known as:  BUSPAR  Take 10 mg by mouth 3 (three) times daily.     diazepam 2 MG tablet  Commonly known as:  VALIUM  Take 1 tablet (2 mg total) by mouth every 6 (six) hours as needed for anxiety.     diltiazem 120 MG tablet  Commonly known as:  CARDIZEM  Take 1 tablet (120 mg total) by mouth every 8 (eight) hours.     fluconazole 100 MG tablet  Commonly known as:  DIFLUCAN  Take 1 tablet (100 mg total) by mouth daily.     FLUoxetine 20 MG capsule  Commonly known as:  PROZAC  TAKE THREE CAPSULES BY MOUTH ONCE DAILY     furosemide 20 MG tablet  Commonly known as:  LASIX  Take 40 mg by mouth daily. She sometimes takes up to 5 tabs a day     guaiFENesin 600 MG 12 hr tablet  Commonly known as:  MUCINEX  Take 600 mg by mouth 2 (two) times daily as needed for cough.     HYDROcodone-acetaminophen 10-325 MG tablet  Commonly known as:  NORCO  Take 1 tablet by mouth every 6 (six) hours as needed for moderate pain (Pain or Dyspnea).      HYDROcodone-homatropine 5-1.5 MG/5ML syrup  Commonly known as:  HYCODAN  Take 2.5 mLs by mouth 2 (two) times daily as needed for cough.     ipratropium-albuterol 0.5-2.5 (3) MG/3ML Soln  Commonly known as:  DUONEB  Take 3 mLs by nebulization 3 (three) times daily.     metoprolol succinate 25 MG 24 hr tablet  Commonly known as:  TOPROL-XL  Take 1 tablet (25 mg total) by mouth daily.     NON FORMULARY  Place 1 drop into both eyes 2 (two) times daily as needed (hurting, dry). thera- tears     pantoprazole 40 MG tablet  Commonly known as:  PROTONIX  TAKE ONE TABLET BY MOUTH  TWICE DAILY     predniSONE 5 MG tablet  Commonly known as:  DELTASONE  Take 1 tablet (5 mg total) by mouth daily with breakfast.  Start taking on:  03/06/2015     SYMBICORT 160-4.5 MCG/ACT inhaler  Generic drug:  budesonide-formoterol  INHALE TWO PUFFS BY MOUTH TWICE DAILY     tiotropium 18 MCG inhalation capsule  Commonly known as:  SPIRIVA  Place 1 capsule (18 mcg total) into inhaler and inhale daily.       Allergies  Allergen Reactions  . Ace Inhibitors Cough  . Pseudoeph-Doxylamine-Dm-Apap Itching  . Diphenhydramine Hcl Other (See Comments)    "feels like my skin is crawling, and legs twitches"  . Erythromycin Diarrhea  . Nsaids Nausea And Vomiting  . Nyquil [Pseudoeph-Doxylamine-Dm-Apap] Itching  . Tramadol Hcl Other (See Comments)    stomach pain, hallucinations  . Codeine Swelling    Face and lips swelling  Patient report Can tolerate hydrocodone  . Fosamax [Alendronate] Other (See Comments)    Nausea and abdominal bloating.   . Tomato Other (See Comments)    Stomach swells  . Orange Fruit [Citrus] Rash  . Pseudoephedrine Palpitations   Follow-up Information    Follow up with Darden Amber, PA. Schedule an appointment as soon as possible for a visit in 1 week.   Why:  Hospital follow up   Contact information:   [place] [address] [phone]        Follow up with Eppie Gibson, MD.   Specialty:  Radiation Oncology   Why:  Follow up as scheduled   Contact information:   [address] [place] [address] [phone]        The results of significant diagnostics from this hospitalization (including imaging, microbiology, ancillary and laboratory) are listed below for reference.    Significant Diagnostic Studies: Dg Chest 2 View  02/23/2015  CLINICAL DATA:  Shortness of breath and cough EXAM: CHEST  2 VIEW COMPARISON:  Chest radiograph February 04, 2015; chest CT January 30, 2015 FINDINGS: There is persistent airspace consolidation in the superior lingular region. No new opacity is evident. Heart size and pulmonary vascularity are normal. No adenopathy. No bone lesions. IMPRESSION: Persistent infiltrate in the superior lingula. No new opacity. This finding in the lingula warrants additional imaging surveillance in approximately 10-14 days. If opacity persists in this area at that time, it would be reasonable to consider bronchoscopy to further assess this region, in particular to assess for possible underlying neoplastic focus or localized endobronchial lesion. Electronically Signed   By: Lowella Grip III M.D.   On: 02/23/2015 19:24   Dg Chest 2 View  02/04/2015  CLINICAL DATA:  Pt here with c/o SOB, nausea since yesterday. Hx chronic SOB, COPD, pna recently, CHF, a-fib, HTN, ex-smoker EXAM: CHEST - 2 VIEW COMPARISON:  01/30/2015 FINDINGS: Progressive patchy airspace disease in the lingula. Mild pulmonary vascular congestion and diffuse interstitial prominence, slightly increased from previous. Heart size upper limits normal. Tortuous atheromatous aorta. No effusion.  No pneumothorax. Visualized skeletal structures are unremarkable. IMPRESSION: 1. Some increase in bilateral interstitial edema or infiltrate with progressive lingular airspace disease suggesting pneumonia. Electronically Signed   By: Lucrezia Europe M.D.   On: 02/04/2015  11:31   Ct Chest Wo Contrast  02/25/2015  CLINICAL DATA:  Progressive dyspnea with cough. EXAM: CT CHEST WITHOUT CONTRAST TECHNIQUE: Multidetector CT imaging of the chest was performed following the standard protocol without IV contrast.  COMPARISON:  01/30/2015. FINDINGS: Mediastinum / Lymph Nodes: There is no axillary lymphadenopathy. 9 mm short axis precarinal lymph node is stable. No mediastinal lymphadenopathy. No definite hilar lymphadenopathy although assessment is limited by the lack of intravenous contrast material. The heart is enlarged. Coronary artery calcification is noted. The esophagus has normal imaging features. Lungs / Pleura: Emphysema noted bilaterally. Segmental opacification in the lingula persists without substantial change. The no evidence for pulmonary edema or pleural effusion. Upper Abdomen:  Calcified gallstones measure up to 18 mm diameter. MSK / Soft Tissues: Bone windows reveal no worrisome lytic or sclerotic osseous lesions. Compression deformity again noted at T5 and T6. IMPRESSION: Stable appearance of confluent consolidation in the lingula. Given the persistence, neoplasm be considered. PET-CT may prove helpful to further evaluate. Compression fractures T5 and T6. Cholelithiasis. Electronically Signed   By: Misty Stanley M.D.   On: 02/25/2015 13:39   Mr Jeri Cos LZ Contrast  03/01/2015  CLINICAL DATA:  Squamous cell lung cancer staging EXAM: MRI HEAD WITHOUT AND WITH CONTRAST TECHNIQUE: Multiplanar, multiecho pulse sequences of the brain and surrounding structures were obtained without and with intravenous contrast. CONTRAST:  76m MULTIHANCE GADOBENATE DIMEGLUMINE 529 MG/ML IV SOLN COMPARISON:  CT head 07/15/2014. FINDINGS: Image quality degraded by motion. Initially unenhanced scan was performed. After further communication, the patient returned for postcontrast imaging to rule out metastatic disease. Ventricle size normal.  Cerebral volume normal for age. Negative for acute  infarct. Scattered small white matter hyperintensities. Moderate hyperintensity throughout the pons. Negative for intracranial hemorrhage. Negative for mass or edema. No shift of the midline structures. Postcontrast imaging degraded by motion. No enhancing metastatic deposits identified. Paranasal sinuses clear. No orbital mass. No skull lesion identified. IMPRESSION: Image quality degraded by motion. Negative for metastatic disease Mild chronic microvascular ischemic change in the white matter. Moderate chronic changes in the pons. Electronically Signed   By: CFranchot GalloM.D.   On: 03/01/2015 06:38   Ct Abdomen Pelvis W Contrast  02/28/2015  CLINICAL DATA:  70year old female with history of lung cancer presenting with productive cough and shortness of breath and wheezing. Metastatic survey. EXAM: CT ABDOMEN AND PELVIS WITH CONTRAST TECHNIQUE: Multidetector CT imaging of the abdomen and pelvis was performed using the standard protocol following bolus administration of intravenous contrast. CONTRAST:  5102mOMNIPAQUE IOHEXOL 300 MG/ML SOLN, 10053mMNIPAQUE IOHEXOL 300 MG/ML SOLN COMPARISON:  CT dated 07/05/2014 FINDINGS: Partially visualized subsegmental densities at the right lung base and lingula likely represent atelectatic changes. There is emphysematous changes of the lung bases. There is coronary vascular calcification. No intra-abdominal free air or free fluid. The gallbladder is filled with stones. A stone is noted in the neck of the gallbladder. No pericholecystic fluid. The liver, pancreas, spleen, and the adrenal glands appear unremarkable. There is no hydronephrosis on either side. The visualized ureters and urinary bladder appear unremarkable. The uterus is grossly unremarkable. There is a mildly dilated segment of proximal small bowel measuring up to 4 cm in diameter. No transition zone identified. The oral contrast traverses into the distal small bowel without evidence of obstruction. This likely  represents a segmental ileus. There is sigmoid diverticulosis without active inflammatory changes. Copious amount of dense stool noted throughout the colon. No evidence of bowel obstruction or inflammation. Normal appendix. There is aortoiliac atherosclerotic disease. There is ectasia of the infrarenal abdominal aorta measuring up to 2.8 cm in diameter. The origins of the celiac axis, SMA, IMA as well as the origins of  the renal arteries are patent. No portal venous gas identified. There is no adenopathy. There is diffuse subcutaneous soft tissue stranding and edema. No drainable fluid collection or hematoma. Osteopenia with degenerative changes of the spine. No acute fracture. IMPRESSION: No CT evidence of intra-abdominal or pelvic metastatic disease. Cholelithiasis. Constipation. No evidence of bowel obstruction or inflammation. Normal appendix. Sigmoid diverticulosis. Electronically Signed   By: Anner Crete M.D.   On: 02/28/2015 23:58   Dg Chest Port 1 View  02/27/2015  CLINICAL DATA:  Status post bronchoscopy EXAM: PORTABLE CHEST 1 VIEW COMPARISON:  Chest radiograph February 23, 2015; chest CT February 25 2015 FINDINGS: There is no apparent pneumothorax. The ill-defined opacity in the superior lingula remains. There there is slight generalized interstitial edema. Heart is borderline enlarged with mild pulmonary venous hypertension. No adenopathy apparent. IMPRESSION: Persistent ill-defined opacity superior lingula. Evidence of a mild degree of congestive heart failure. No pneumothorax. Electronically Signed   By: Lowella Grip III M.D.   On: 02/27/2015 09:37   Dg C-arm Bronchoscopy  02/27/2015  CLINICAL DATA:  C-ARM BRONCHOSCOPY Fluoroscopy was utilized by the requesting physician.  No radiographic interpretation.    Microbiology: Recent Results (from the past 240 hour(s))  Culture, expectorated sputum-assessment     Status: None   Collection Time: 02/26/15  1:00 AM  Result Value Ref Range  Status   Specimen Description SPUTUM  Final   Special Requests NONE  Final   Sputum evaluation   Final    THIS SPECIMEN IS ACCEPTABLE. RESPIRATORY CULTURE REPORT TO FOLLOW.   Report Status 02/26/2015 FINAL  Final  Culture, respiratory (NON-Expectorated)     Status: None   Collection Time: 02/26/15  1:00 AM  Result Value Ref Range Status   Specimen Description SPU  Final   Special Requests NONE  Final   Gram Stain   Final    ABUNDANT WBC PRESENT, PREDOMINANTLY PMN RARE SQUAMOUS EPITHELIAL CELLS PRESENT FEW YEAST Performed at [facility]    Culture   Final    NORMAL OROPHARYNGEAL FLORA Performed at [facility]    Report Status 02/28/2015 FINAL  Final  Culture, bal-quantitative     Status: None   Collection Time: 02/27/15  8:58 AM  Result Value Ref Range Status   Specimen Description BRONCHIAL ALVEOLAR LAVAGE  Final   Special Requests NONE  Final   Gram Stain   Final    ABUNDANT WBC PRESENT,BOTH PMN AND MONONUCLEAR MODERATE SQUAMOUS EPITHELIAL CELLS PRESENT RARE YEAST RARE GRAM POSITIVE COCCI IN PAIRS    Colony Count   Final    80,000 COLONIES/ML Performed at [facility]    Culture   Final    NORMAL OROPHARYNGEAL FLORA Performed at [facility]    Report Status 03/01/2015 FINAL  Final  AFB culture with smear     Status: None (Preliminary result)   Collection Time: 02/27/15  8:58 AM  Result Value Ref Range Status   Specimen Description BRONCHIAL ALVEOLAR LAVAGE  Final   Special Requests NONE  Final   Acid Fast Smear   Final    NO ACID FAST BACILLI SEEN Performed at [facility]    Culture   Final    CULTURE WILL BE EXAMINED FOR 6 WEEKS BEFORE ISSUING A FINAL REPORT Performed at [facility]    Report Status PENDING  Incomplete  Fungus Culture with Smear     Status: None (Preliminary result)   Collection Time: 02/27/15  8:58 AM  Result Value Ref Range Status   Specimen Description BRONCHIAL ALVEOLAR LAVAGE   Final   Special Requests NONE  Final   Fungal Smear RARE YEAST Performed at [facility]   Final   Culture   Final    CANDIDA ALBICANS Performed at [facility]    Report Status PENDING  Incomplete  MRSA PCR Screening     Status: None   Collection Time: 02/27/15 11:15 AM  Result Value Ref Range Status   MRSA by PCR NEGATIVE NEGATIVE Final    Comment:        The GeneXpert MRSA Assay (FDA approved for NASAL specimens only), is one component of a comprehensive MRSA colonization surveillance program. It is not intended to diagnose MRSA infection nor to guide or monitor treatment for MRSA infections.      Labs: Basic Metabolic Panel:  Recent Labs Lab 02/27/15 0650 02/28/15 0337  NA 142 143  K 4.4 4.5  CL 100* 103  CO2 32 32  GLUCOSE 128* 218*  BUN 26* 32*  CREATININE 0.75 0.73  CALCIUM 8.8* 8.9   Liver Function Tests: No results for input(s): AST, ALT, ALKPHOS, BILITOT, PROT, ALBUMIN in the last 168 hours. No results for input(s): LIPASE, AMYLASE in the last 168 hours. No results for input(s): AMMONIA in the last 168 hours. CBC:  Recent Labs Lab 02/27/15 0650 02/28/15 0337  WBC 11.3* 8.9  HGB 11.2* 10.5*  HCT 38.9 35.7*  MCV 98.5 98.6  PLT 242 207   Cardiac Enzymes: No results for input(s): CKTOTAL, CKMB, CKMBINDEX, TROPONINI in the last 168 hours. BNP: BNP (last 3 results)  Recent Labs  01/30/15 0625 02/04/15 1142 02/24/15 0524  BNP 181.4* 217.6* 306.5*    ProBNP (last 3 results) No results for input(s): PROBNP in the last 8760 hours.  CBG: No results for input(s): GLUCAP in the last 168 hours.  Signed:  Lacy Sofia, Orpah Melter  [facility] 03/05/2015, 1:49 PM

## 2015-03-06 ENCOUNTER — Telehealth: Payer: Self-pay | Admitting: *Deleted

## 2015-03-06 NOTE — Telephone Encounter (Signed)
XXXX 

## 2015-03-06 NOTE — Telephone Encounter (Signed)
CALLED PATIENT TO INFORM OF PET FOR 03-13-15 AND HER VISIT AND SIM ON 03-15-15 WITH DR. Isidore Moos, SPOKE WITH PATIENT AND SHE IS AWARE OF THESE APPTS.

## 2015-03-07 ENCOUNTER — Telehealth: Payer: Self-pay | Admitting: Pulmonary Disease

## 2015-03-07 ENCOUNTER — Other Ambulatory Visit: Payer: Self-pay | Admitting: *Deleted

## 2015-03-07 ENCOUNTER — Other Ambulatory Visit: Payer: Self-pay | Admitting: Pulmonary Disease

## 2015-03-07 MED ORDER — IPRATROPIUM-ALBUTEROL 0.5-2.5 (3) MG/3ML IN SOLN: 3.0000 mL | Freq: Three times a day (TID) | RESPIRATORY_TRACT | Status: DC

## 2015-03-07 NOTE — Telephone Encounter (Signed)
Spoke with pt. She needs a refill on Duoneb. This has been sent in. Nothing further was needed.

## 2015-03-07 NOTE — Patient Outreach (Signed)
[place] [facility]) Care Management  03/07/2015  NOHA MILBERGER [birth year] [id number]   Transition of care (week 1)  Rn attempted outreach call today however unsuccessful. Will continue attempts for [facility] services.   [name], RN Care Management Coordinator [facility] Main Office [phone]

## 2015-03-11 ENCOUNTER — Other Ambulatory Visit: Payer: Self-pay | Admitting: *Deleted

## 2015-03-11 DIAGNOSIS — J441 Chronic obstructive pulmonary disease with (acute) exacerbation: Secondary | ICD-10-CM

## 2015-03-11 NOTE — Progress Notes (Addendum)
Thoracic Location of Tumor / Histology:  Bronchoalveolar lavage was positive for squamous cell carcinoma  Patient presented with symptoms of: She reports right shoulder pain radiating to mid right arm and neck for 1 year.   Tobacco/Marijuana/Snuff/ETOH use: She is a former smoker who quit 06/18/2014. She smoked one pack of cigarettes daily for 35 years.   Past/Anticipated interventions by cardiothoracic surgery, if any: She is not a candidate for surgery.   Past/Anticipated interventions by medical oncology, if any: She is not a candidate for chemotherapy per Dr. Alvy Bimler 03/01/15  Signs/Symptoms  Weight changes, if any: She reports she has lost weight intentionally over the last 3 years. Her daughter does report she has lost muscle mass over the last year, because of mobility issues.   Respiratory complaints, if any: She is on 2 Liter of oxygen continuous. She becomes short of breath with little exertion. She has multiple nebulizers she uses at home.   Hemoptysis, if any: No  Pain issues, if any:  She complains of pain in her Right shoulder a 7/10. She has taken hydrocodone 2 tablets this morning. She takes hydrocodone every 6 hours at home around the clock  SAFETY ISSUES:  Prior radiation? No  Pacemaker/ICD? No  Possible current pregnancy? No  Is the patient on methotrexate? No  Current Complaints / other details:   PET 03/13/15 Results as follows: IMPRESSION: Interval improvement in lingular airspace disease with low-grade metabolic activity. This is likely due to improving postobstructive pneumonitis.  Small solitary focus of hypermetabolic activity in left hilum with SUV max of 4.1, without evidence of corresponding mass by CT. 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  No other metastatic disease identified.  Hypermetabolic activity at the anorectal junction corresponding with asymmetric soft tissue prominence  PET-CT. Anorectal carcinoma cannot be excluded. Recommend correlation with rectal exam and possible Proctoscopy.  BP 122/101 mmHg  Pulse 72  Temp(Src) 98.3 F (36.8 C)  Ht '5\' 5"'$  (1.651 m)  Wt 185 lb (83.915 kg)  BMI 30.79 kg/m2  SpO2 95%   Wt Readings from Last 3 Encounters:  03/15/15 185 lb (83.915 kg)  03/12/15 194 lb (87.998 kg)  03/05/15 201 lb 11.2 oz (91.491 kg)

## 2015-03-11 NOTE — Patient Outreach (Signed)
[place] [facility]) Care Management  03/11/2015  Kelsey Garcia 06-22-1945 [id number]   Patient triggered RED on EMMI Pneumonia dashboard, notification sent to [name], RN.  Thanks, Ronnell Fresh[place]. [place], [place] Assistant Phone: [phone] Fax: [phone]

## 2015-03-11 NOTE — Patient Outreach (Signed)
[place] [facility]) Care Management  03/11/2015  Kelsey Garcia [birth year] [id number]  Transition of care (week 2)  RN spoke with pt today concerning [facility] services and introduced available services. Pt reports she recent was diagnosed with Cancer and will undergo radiation starting on Friday. Pt has several appointments and in needs of several community resources. Pt offered social worker consult via [facility] (receptive). Completed transition of care as pt receptive to ongoing weekly contacts at this time.   [name], RN Care Management Coordinator [place] Office [phone]

## 2015-03-12 ENCOUNTER — Ambulatory Visit (INDEPENDENT_AMBULATORY_CARE_PROVIDER_SITE_OTHER): Payer: Medicare Other | Admitting: Pulmonary Disease

## 2015-03-12 ENCOUNTER — Encounter: Payer: Self-pay | Admitting: Pulmonary Disease

## 2015-03-12 ENCOUNTER — Encounter: Payer: Self-pay | Admitting: *Deleted

## 2015-03-12 VITALS — BP 120/64 | HR 137 | Ht 65.0 in | Wt 194.0 lb

## 2015-03-12 DIAGNOSIS — J9611 Chronic respiratory failure with hypoxia: Secondary | ICD-10-CM | POA: Diagnosis not present

## 2015-03-12 DIAGNOSIS — J449 Chronic obstructive pulmonary disease, unspecified: Secondary | ICD-10-CM

## 2015-03-12 DIAGNOSIS — G894 Chronic pain syndrome: Secondary | ICD-10-CM

## 2015-03-12 DIAGNOSIS — C3492 Malignant neoplasm of unspecified part of left bronchus or lung: Secondary | ICD-10-CM

## 2015-03-12 MED ORDER — DILTIAZEM HCL 120 MG PO TABS: 120.0000 mg | ORAL_TABLET | Freq: Three times a day (TID) | ORAL | Status: DC

## 2015-03-12 MED ORDER — HYDROCODONE-ACETAMINOPHEN 10-325 MG PO TABS: 1.0000 | ORAL_TABLET | Freq: Four times a day (QID) | ORAL | Status: DC | PRN

## 2015-03-12 NOTE — Assessment & Plan Note (Signed)
Stay on 5 mg prednisone, symbicort & spiriva

## 2015-03-12 NOTE — Assessment & Plan Note (Addendum)
PET scan planned then SBRT simulation on Friday. We discussed her prognosis will be based on staging of the mediastinum

## 2015-03-12 NOTE — Assessment & Plan Note (Signed)
Ct 2-3 L o2 at all times

## 2015-03-12 NOTE — Progress Notes (Signed)
   Subjective:    Patient ID: Kelsey Garcia, female    DOB: August 10, 1945, 70 y.o.   MRN: [id number]  HPI  69/ F, ex -smoker , quit 06/2014 ,for FU of COPD And recent diagnosis of lung cancer  She has a h/o opiate abuse in the 70's.   03/12/2015  Chief Complaint  Patient presents with  . Follow-up    Patient was diagnosed with Lung cancer.  Going Thursday for PET scan.  Wants to know if she is supposed to start radiation.  She has not been given any information as far as treatment options.  Wants to know what stage of cancer she is in.  She needs to make an end of life plan.  Patient wants to know if there is another medication she can take other than protonix for her reflux.    was in hospital in December 2016 with pneumonia, presented with progressive dyspnea, cough, and sputum. She was noted to have progressive lingular infiltrate >> bronchoscopy showed NSCLC (Squamous cell). T2N0MO  Remains on Symbicort and Spiriva.  Remains on Oxygen 2l/m . Uses 3 l/m on pulsing concentrator. Using a wheelchair  She denies any hemoptysis, orthopnea, PND, or increased leg swelling.  She has numerous questions about prognosis of lung cancer and what to expect  Heart rate noted to be high initially, decreased to 90  I personally reviewed imaging studies and compared to old films   Significant tests/ events PFT's 10/2012 show FEV1/FVC = 71% predicted, FEV1 = 49% predicted, DLCO 39% predicted CT 06/2014 no ILD, resolved nodular ASD CT chest 02/2015  Lingular consolidation, compression fractures T5 and T6  MRI brain 02/2015 neg CT abd 02/2015 neg   Review of Systems neg for any significant sore throat, dysphagia, itching, sneezing, nasal congestion or excess/ purulent secretions, fever, chills, sweats, unintended wt loss, pleuritic or exertional cp, hempoptysis, orthopnea pnd or change in chronic leg swelling.  Also denies presyncope, palpitations, heartburn, abdominal pain, nausea, vomiting, diarrhea or  change in bowel or urinary habits, dysuria,hematuria, rash, arthralgias, visual complaints, headache, numbness weakness or ataxia.     Objective:   Physical Exam  Gen. Pleasant, well-nourished, in no distress, anxious affect, wheelchair, [facility] - no lesions, no post nasal drip Neck: No JVD, no thyromegaly, no carotid bruits Lungs: no use of accessory muscles, no dullness to percussion, decreased BL  without rales or rhonchi  Cardiovascular: Rhythm regular, heart sounds  normal, no murmurs or gallops, no peripheral edema Abdomen: soft and non-tender, no hepatosplenomegaly, BS normal. Musculoskeletal: No deformities, no cyanosis or clubbing Neuro:  alert, non focal, tremors +       Assessment & Plan:

## 2015-03-12 NOTE — Assessment & Plan Note (Signed)
Refills on cardizem x 1 month & pain pills x 1 week until you see your PCP

## 2015-03-12 NOTE — Patient Instructions (Signed)
Good luck with the PET scan Refills on cardizem x 1 month & pain pills x 1 week until you see your PCP Stay on low dose prednisone, symbicort & spiriva

## 2015-03-13 ENCOUNTER — Ambulatory Visit (HOSPITAL_COMMUNITY)
Admission: RE | Admit: 2015-03-13 | Discharge: 2015-03-13 | Disposition: A | Discharge status: Home / Self Care | Payer: Medicare Other | Source: Ambulatory Visit | Attending: Radiation Oncology | Admitting: Radiation Oncology

## 2015-03-13 DIAGNOSIS — Z79899 Other long term (current) drug therapy: Secondary | ICD-10-CM | POA: Insufficient documentation

## 2015-03-13 DIAGNOSIS — K8020 Calculus of gallbladder without cholecystitis without obstruction: Secondary | ICD-10-CM | POA: Diagnosis not present

## 2015-03-13 DIAGNOSIS — K5730 Diverticulosis of large intestine without perforation or abscess without bleeding: Secondary | ICD-10-CM | POA: Diagnosis not present

## 2015-03-13 DIAGNOSIS — R948 Abnormal results of function studies of other organs and systems: Secondary | ICD-10-CM | POA: Diagnosis not present

## 2015-03-13 DIAGNOSIS — C3492 Malignant neoplasm of unspecified part of left bronchus or lung: Secondary | ICD-10-CM | POA: Diagnosis present

## 2015-03-13 LAB — GLUCOSE, CAPILLARY: Glucose-Capillary: 82 mg/dL (ref 65–99)

## 2015-03-13 MED ORDER — FLUDEOXYGLUCOSE F - 18 (FDG) INJECTION
9.4400 | Freq: Once | INTRAVENOUS | Status: AC | PRN
  Administered 2015-03-13: 9.44 via INTRAVENOUS

## 2015-03-14 ENCOUNTER — Other Ambulatory Visit: Payer: Self-pay | Admitting: *Deleted

## 2015-03-14 ENCOUNTER — Ambulatory Visit (HOSPITAL_COMMUNITY): Payer: Medicare Other

## 2015-03-14 NOTE — Patient Outreach (Signed)
[place] [facility]) Care Management  03/14/2015  Kelsey Garcia [birth year] [id number]   [facility] Outreach (Red Flag 2/1)  RN spoke with pt and introduced the purpose of today's call to inquire on her recent symptoms reported vis EMMI questionnaire. RN inquired on her red zones concerning "wheezing and swelling". Pt states "I am okay" and reports her upcoming follow up appointments. States she was recently diagnosed with lung Ca and this has caused a lot of stress and anxiety. Reports [facility] will be visiting on Saturday and Monday along with a follow up visit with her doctor on Monday. States she will began her treatment for her cancer on this Friday. Pt states her swelling is "about the same" and did not respond when RN inquired on her wheezing however did not sound SOB or in distress when conversating with the RN concerning the updated information provided. RN explained to pt that when she has a [employer] is required to follow up and inquire further on her condition. Pt slight irritated and once again reiterated on the above information stating she was going to turn her phone off and would not answer her phone until 7AM indicating she was going to lay down and rest. RN attempted to indicate this [facility] RN would be available if needed however pt ended the call. Pt did not request calls to stop just indicated she has "a lot going on at this time".   Due to the stress mentioned above RN has placed an order for social worker consult earlier in this week. RN will follow up with the assigned worker concerning pt's needs and situation with other involved services.  [name], RN Care Management Coordinator [place] Office [phone]

## 2015-03-14 NOTE — Patient Outreach (Signed)
[place] [facility]) Care Management  03/14/2015  ADANELY REYNOSO Oct 20, 1945 [id number]   Patient triggered RED on EMMI Pneumonia dashboard, notification sent to [name], RN.  Thanks, Ronnell Freshwater. [place], [place] Assistant Phone: [phone] Fax: [phone]

## 2015-03-15 ENCOUNTER — Telehealth: Payer: Self-pay | Admitting: *Deleted

## 2015-03-15 ENCOUNTER — Encounter: Payer: Self-pay | Admitting: *Deleted

## 2015-03-15 ENCOUNTER — Ambulatory Visit
Admission: RE | Admit: 2015-03-15 | Discharge: 2015-03-15 | Disposition: A | Discharge status: Home / Self Care | Payer: Medicare Other | Source: Ambulatory Visit | Attending: Radiation Oncology | Admitting: Radiation Oncology

## 2015-03-15 ENCOUNTER — Encounter: Payer: Self-pay | Admitting: Radiation Oncology

## 2015-03-15 VITALS — BP 122/101 | HR 72 | Temp 98.3°F | Ht 65.0 in | Wt 185.0 lb

## 2015-03-15 DIAGNOSIS — C3410 Malignant neoplasm of upper lobe, unspecified bronchus or lung: Secondary | ICD-10-CM | POA: Insufficient documentation

## 2015-03-15 DIAGNOSIS — Z510 Encounter for antineoplastic radiation therapy: Secondary | ICD-10-CM | POA: Diagnosis present

## 2015-03-15 DIAGNOSIS — K649 Unspecified hemorrhoids: Secondary | ICD-10-CM

## 2015-03-15 MED ORDER — LORAZEPAM 1 MG PO TABS: ORAL_TABLET | ORAL | Status: DC

## 2015-03-15 MED ORDER — LORAZEPAM 1 MG PO TABS
1.0000 mg | ORAL_TABLET | ORAL | Status: AC
  Administered 2015-03-15: 1 mg via ORAL
  Filled 2015-03-15: qty 1

## 2015-03-15 NOTE — Progress Notes (Signed)
Radiation Oncology         [phone] ________________________________  Name: Kelsey Garcia MRN: [id number]  Date: 03/15/2015  DOB: November 21, 1945  Follow-Up Visit Note  Outpatient  CC: Darden Amber, [place]  Heath Lark, MD  Diagnosis and Prior Radiotherapy:    ICD-9-CM ICD-10-CM   1. Cancer of lingula of lung (HCC) 162.3 C34.10 Ambulatory referral to Social Work     LORazepam (ATIVAN) 1 MG tablet     LORazepam (ATIVAN) tablet 1 mg     T1a N0 M0 Stage I squamous cell carcinoma of the lung  Narrative:  The patient returns today for routine follow-up. Patient has been discharged from hospital since consultation. PET scan was performed. This revealed endobronchial hypermetabolic activity at the left hilum without obvious mass. The airspace disease in the left lingula, felt to be related to infection, has improved significantly. She does have some hypermetabolic activity at the anorectal junction. She reports she has had hemorrhoids for 47 years. She has lost about 16 pounds since January 24th, 2017. She reports right shoulder pain radiating to mid right arm and neck, that she rates at a 7/10, for 1 year. Although she has chronic pain in the right shoulder radiating to her right arm and right neck, there is no evidence of metastatic disease on PET to explain this pain. She is on 2 Liter of oxygen continuous. She becomes short of breath with little exertion. She has multiple nebulizers she uses at home. She denies hemoptysis.  She took her anti-anxiety medication at 5:30 am. I gave her some more Ativan for the scan. She has also taken hydrocodone 2 tablets this morning and takes it every 6 hours at home around the clock.  Tobacco use: She is a former smoker who quit 06/18/2014.  She is not a good candidate for surgery. She is not a good candidate for chemotherapy per Dr. Alvy Bimler 03/01/2015.  ALLERGIES:  is allergic to ace inhibitors; pseudoeph-doxylamine-dm-apap; diphenhydramine hcl; erythromycin;  nsaids; nyquil; tramadol hcl; codeine; fosamax; tomato; orange fruit; and pseudoephedrine.  Meds: Current Outpatient Prescriptions  Medication Sig Dispense Refill  . albuterol (PROVENTIL) (2.5 MG/3ML) 0.083% nebulizer solution Take 3 mLs (2.5 mg total) by nebulization every 6 (six) hours as needed for wheezing. 75 mL 1  . aspirin 325 MG EC tablet Take 650 mg by mouth daily.    . busPIRone (BUSPAR) 10 MG tablet Take 10 mg by mouth 3 (three) times daily.    . diazepam (VALIUM) 2 MG tablet Take 1 tablet (2 mg total) by mouth every 6 (six) hours as needed for anxiety. 30 tablet 0  . diltiazem (CARDIZEM) 120 MG tablet Take 1 tablet (120 mg total) by mouth every 8 (eight) hours. 90 tablet 0  . FLUoxetine (PROZAC) 20 MG capsule TAKE THREE CAPSULES BY MOUTH ONCE DAILY (Patient taking differently: Take 60 mg by mouth daily. ) 90 capsule 5  . furosemide (LASIX) 20 MG tablet Take 40 mg by mouth daily. She sometimes takes up to 5 tabs a day    . guaiFENesin (MUCINEX) 600 MG 12 hr tablet Take 600 mg by mouth 2 (two) times daily as needed for cough.    Marland Kitchen HYDROcodone-acetaminophen (NORCO) 10-325 MG tablet Take 1 tablet by mouth every 6 (six) hours as needed for moderate pain (Pain or Dyspnea). 28 tablet 0  . HYDROcodone-homatropine (HYCODAN) 5-1.5 MG/5ML syrup Take 2.5 mLs by mouth 2 (two) times daily as needed for cough.    Marland Kitchen HYDROcodone-homatropine (HYCODAN) 5-1.5 MG/5ML  syrup Take 2.5 mLs by mouth 2 (two) times daily as needed for cough. 120 mL 0  . ipratropium-albuterol (DUONEB) 0.5-2.5 (3) MG/3ML SOLN Take 3 mLs by nebulization 3 (three) times daily. 360 mL 3  . metoprolol succinate (TOPROL-XL) 25 MG 24 hr tablet Take 1 tablet (25 mg total) by mouth daily. 90 tablet 3  . NON FORMULARY Place 1 drop into both eyes 2 (two) times daily as needed (hurting, dry). thera- tears    . pantoprazole (PROTONIX) 40 MG tablet TAKE ONE TABLET BY MOUTH TWICE DAILY 60 tablet 5  . predniSONE (DELTASONE) 5 MG tablet Take 1  tablet (5 mg total) by mouth daily with breakfast.    . SPIRIVA HANDIHALER 18 MCG inhalation capsule PLACE ONE CAPSULE INTO INHALER AND INHALE DAILY 90 capsule 0  . SYMBICORT 160-4.5 MCG/ACT inhaler INHALE TWO PUFFS BY MOUTH TWICE DAILY 1 Inhaler 3  . VENTOLIN HFA 108 (90 Base) MCG/ACT inhaler INHALE ONE TO TWO PUFFS BY MOUTH EVERY 6 HOURS AS NEEDED FOR WHEEZING 18 each 0  . LORazepam (ATIVAN) 1 MG tablet Tale 1 tab 30 minutes prior to radiotherapy for anxiety. 10 tablet 0   No current facility-administered medications for this encounter.    Physical Findings: The patient is in no acute distress. Patient is alert and oriented.  height is '5\' 5"'$  (1.651 m) and weight is 185 lb (83.915 kg). Her temperature is 98.3 F (36.8 C). Her blood pressure is 122/101 and her pulse is 72. Her oxygen saturation is 95%.    Patient is breathing oxygen through nasal canula. She is sitting in a wheelchair. Digital rectal exam reveals external inflamed bleeding hemorhorrids. No obvious tumor but this area looks very irritated. No palpable tumor along the anorectal canal upon digital exam.  Lab Findings: Lab Results  Component Value Date   WBC 8.9 02/28/2015   HGB 10.5* 02/28/2015   HCT 35.7* 02/28/2015   MCV 98.6 02/28/2015   PLT 207 02/28/2015    Radiographic Findings: Dg Chest 2 View  02/23/2015  CLINICAL DATA:  Shortness of breath and cough EXAM: CHEST  2 VIEW COMPARISON:  Chest radiograph February 04, 2015; chest CT January 30, 2015 FINDINGS: There is persistent airspace consolidation in the superior lingular region. No new opacity is evident. Heart size and pulmonary vascularity are normal. No adenopathy. No bone lesions. IMPRESSION: Persistent infiltrate in the superior lingula. No new opacity. This finding in the lingula warrants additional imaging surveillance in approximately 10-14 days. If opacity persists in this area at that time, it would be reasonable to consider bronchoscopy to further assess  this region, in particular to assess for possible underlying neoplastic focus or localized endobronchial lesion. Electronically Signed   By: Lowella Grip III M.D.   On: 02/23/2015 19:24   Ct Chest Wo Contrast  02/25/2015  CLINICAL DATA:  Progressive dyspnea with cough. EXAM: CT CHEST WITHOUT CONTRAST TECHNIQUE: Multidetector CT imaging of the chest was performed following the standard protocol without IV contrast. COMPARISON:  01/30/2015. FINDINGS: Mediastinum / Lymph Nodes: There is no axillary lymphadenopathy. 9 mm short axis precarinal lymph node is stable. No mediastinal lymphadenopathy. No definite hilar lymphadenopathy although assessment is limited by the lack of intravenous contrast material. The heart is enlarged. Coronary artery calcification is noted. The esophagus has normal imaging features. Lungs / Pleura: Emphysema noted bilaterally. Segmental opacification in the lingula persists without substantial change. The no evidence for pulmonary edema or pleural effusion. Upper Abdomen:  Calcified gallstones measure up  to 18 mm diameter. MSK / Soft Tissues: Bone windows reveal no worrisome lytic or sclerotic osseous lesions. Compression deformity again noted at T5 and T6. IMPRESSION: Stable appearance of confluent consolidation in the lingula. Given the persistence, neoplasm be considered. PET-CT may prove helpful to further evaluate. Compression fractures T5 and T6. Cholelithiasis. Electronically Signed   By: Misty Stanley M.D.   On: 02/25/2015 13:39   Mr Jeri Cos EU Contrast  03/01/2015  CLINICAL DATA:  Squamous cell lung cancer staging EXAM: MRI HEAD WITHOUT AND WITH CONTRAST TECHNIQUE: Multiplanar, multiecho pulse sequences of the brain and surrounding structures were obtained without and with intravenous contrast. CONTRAST:  1m MULTIHANCE GADOBENATE DIMEGLUMINE 529 MG/ML IV SOLN COMPARISON:  CT head 07/15/2014. FINDINGS: Image quality degraded by motion. Initially unenhanced scan was  performed. After further communication, the patient returned for postcontrast imaging to rule out metastatic disease. Ventricle size normal.  Cerebral volume normal for age. Negative for acute infarct. Scattered small white matter hyperintensities. Moderate hyperintensity throughout the pons. Negative for intracranial hemorrhage. Negative for mass or edema. No shift of the midline structures. Postcontrast imaging degraded by motion. No enhancing metastatic deposits identified. Paranasal sinuses clear. No orbital mass. No skull lesion identified. IMPRESSION: Image quality degraded by motion. Negative for metastatic disease Mild chronic microvascular ischemic change in the white matter. Moderate chronic changes in the pons. Electronically Signed   By: CFranchot GalloM.D.   On: 03/01/2015 06:38   Ct Abdomen Pelvis W Contrast  02/28/2015  CLINICAL DATA:  70year old female with history of lung cancer presenting with productive cough and shortness of breath and wheezing. Metastatic survey. EXAM: CT ABDOMEN AND PELVIS WITH CONTRAST TECHNIQUE: Multidetector CT imaging of the abdomen and pelvis was performed using the standard protocol following bolus administration of intravenous contrast. CONTRAST:  578mOMNIPAQUE IOHEXOL 300 MG/ML SOLN, 10073mMNIPAQUE IOHEXOL 300 MG/ML SOLN COMPARISON:  CT dated 07/05/2014 FINDINGS: Partially visualized subsegmental densities at the right lung base and lingula likely represent atelectatic changes. There is emphysematous changes of the lung bases. There is coronary vascular calcification. No intra-abdominal free air or free fluid. The gallbladder is filled with stones. A stone is noted in the neck of the gallbladder. No pericholecystic fluid. The liver, pancreas, spleen, and the adrenal glands appear unremarkable. There is no hydronephrosis on either side. The visualized ureters and urinary bladder appear unremarkable. The uterus is grossly unremarkable. There is a mildly dilated  segment of proximal small bowel measuring up to 4 cm in diameter. No transition zone identified. The oral contrast traverses into the distal small bowel without evidence of obstruction. This likely represents a segmental ileus. There is sigmoid diverticulosis without active inflammatory changes. Copious amount of dense stool noted throughout the colon. No evidence of bowel obstruction or inflammation. Normal appendix. There is aortoiliac atherosclerotic disease. There is ectasia of the infrarenal abdominal aorta measuring up to 2.8 cm in diameter. The origins of the celiac axis, SMA, IMA as well as the origins of the renal arteries are patent. No portal venous gas identified. There is no adenopathy. There is diffuse subcutaneous soft tissue stranding and edema. No drainable fluid collection or hematoma. Osteopenia with degenerative changes of the spine. No acute fracture. IMPRESSION: No CT evidence of intra-abdominal or pelvic metastatic disease. Cholelithiasis. Constipation. No evidence of bowel obstruction or inflammation. Normal appendix. Sigmoid diverticulosis. Electronically Signed   By: AraAnner CreteD.   On: 02/28/2015 23:58   Nm Pet Image Initial (pi) Skull Base To  Thigh  03/13/2015  CLINICAL DATA:  Initial treatment strategy for left lung squamous cell carcinoma on bronchoscopic brushing. EXAM: NUCLEAR MEDICINE PET SKULL BASE TO THIGH TECHNIQUE: 9.4 mCi F-18 FDG was injected intravenously. Full-ring PET imaging was performed from the skull base to thigh after the radiotracer. CT data was obtained and used for attenuation correction and anatomic localization. FASTING BLOOD GLUCOSE:  Value: 82 mg/dl COMPARISON:  Chest CT on 02/25/2015 FINDINGS: NECK No hypermetabolic lymph nodes in the neck. CHEST Previously seen airspace opacity in the lingula shows interval improvement since prior study. This shows low-grade metabolic activity with SUV max of 3.0, which is suspicious for postobstructive pneumonitis  or other infectious or inflammatory process. A solitary focus of hypermetabolic activity is seen in the left hilum on image 56 of series 3 which has SUV max of 4.1. No corresponding mass or adenopathy is visualized by CT. No other hypermetabolic hilar or mediastinal masses or lymph nodes are identified. ABDOMEN/PELVIS No abnormal hypermetabolic activity within the liver, pancreas, adrenal glands, or spleen. No hypermetabolic lymph nodes in the abdomen or pelvis. Focal hypermetabolic activity is seen at the anorectal junction corresponding with asymmetric soft tissue prominence. This has SUV max of 12.4. Anorectal carcinoma cannot be excluded. Cholelithiasis is noted, without evidence of cholecystitis. Sigmoid diverticulosis is also demonstrated, without evidence of diverticulitis. SKELETON No focal hypermetabolic activity to suggest skeletal metastasis. IMPRESSION: Interval improvement in lingular airspace disease with low-grade metabolic activity. This is likely due to improving postobstructive pneumonitis. Small solitary focus of hypermetabolic activity in left hilum with SUV max of 4.1, without evidence of corresponding mass by CT. 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 No other metastatic disease identified. Hypermetabolic activity at the anorectal junction corresponding with asymmetric soft tissue prominence PET-CT. Anorectal carcinoma cannot be excluded. Recommend correlation with rectal exam and possible proctoscopy. Electronically Signed   By: Earle Gell M.D.   On: 03/13/2015 09:43   Dg Chest Port 1 View  02/27/2015  CLINICAL DATA:  Status post bronchoscopy EXAM: PORTABLE CHEST 1 VIEW COMPARISON:  Chest radiograph February 23, 2015; chest CT February 25 2015 FINDINGS: There is no apparent pneumothorax. The ill-defined opacity in the superior lingula remains. There there is slight generalized interstitial edema. Heart is borderline enlarged with  mild pulmonary venous hypertension. No adenopathy apparent. IMPRESSION: Persistent ill-defined opacity superior lingula. Evidence of a mild degree of congestive heart failure. No pneumothorax. Electronically Signed   By: Lowella Grip III M.D.   On: 02/27/2015 09:37   Dg C-arm Bronchoscopy  02/27/2015  CLINICAL DATA:  C-ARM BRONCHOSCOPY Fluoroscopy was utilized by the requesting physician.  No radiographic interpretation.    Impression/Plan:  Today, I talked to the patient about the findings and work-up thus far. We discussed the patient's diagnosis of lung cancer and general treatment for this, highlighting the role of radiotherapy in the management. We discussed the available radiation techniques, and focused on the details of logistics and delivery.    We discussed the risks, benefits, and side effects of radiotherapy. Side effects may include but not necessarily be limited to: fatigue, lung injury, cough, skin irritation, injury to esophagus, heart, or other internal organs of the chest. No guarantees of treatment were given. A consent form was signed and placed in the patient's medical record. The patient was encouraged to ask questions that I answered to the best of my ability.   Of note: the patient is quite debilitated with multiple co-morbidities. I would like  to treat her cancer which, by PET, appears to be confined to the airway/hilum of the left lung. I plan to treat with stereotactic radiotherapy over a 10 fraction course which I think she will realistically tolerate well and achieve good local control of her cancer. She has her planning appointment scheduled this morning.  Patient has a family history of colon cancer and complains of severe hemorrhoids for over 4 decades. She has not been screen recently for colon cancer. She has not received medical attention for her hemorrhoids recently. I am not sure if proctoscopy would be indicated to rule out tumor in light of her PET scan  results and abnormalities on her physical exam today. For these issues, I will refer her to a gastroenterologist.  She asked for pain medication, however, since her pain is not cancer-related, I suggest that she goes back to her PCP for pain medication. I did prescribe her Ativan for her anxiety for her to take each day before treatment.  I spent 25 minutes face to face with the patient and more than 50% of that time was spent in counseling and/or coordination of care. _____________________________________   Eppie Gibson, MD    This document serves as a record of services personally performed by Eppie Gibson, MD. It was created on her behalf by Lendon Collar, a trained medical scribe. The creation of this record is based on the scribe's personal observations and the provider's statements to them. This document has been checked and approved by the attending provider.

## 2015-03-15 NOTE — Telephone Encounter (Signed)
Called patient to inform of appt. With [employer]. On Tuesday Feb. 7 - arrival time - 2:15 pm with Jesscia Zehr, lvm for a return call

## 2015-03-15 NOTE — Progress Notes (Addendum)
  Radiation Oncology         [phone] ________________________________  Name: JAEMARIE HOCHBERG MRN: [id number]  Date: 03/15/2015  DOB: 1945-09-30  4DCT COMPLEX SIMULATION / TREATMENT PLANNING NOTE   Outpatient    ICD-9-CM ICD-10-CM   1. Cancer of lingula of lung (HCC) 162.3 C34.10     The patient was positioned on the CT simulator in a complex treatment device custom fitted to their body: A body fix blue bag. The patient's head was in an Accuform.  The patient's arms were over their head. An abdominal compression device was snugly fitted to decrease the patient's intrathoracic movements.   RESPIRATORY MOTION MANAGEMENT SIMULATION  NARRATIVE:  In order to account for effect of respiratory motion on target structures and other organs in the planning and delivery of radiotherapy, this patient underwent respiratory motion management simulation.  To accomplish this, when the patient was brought to the CT simulation planning suite, 4D respiratory motion management CT images were obtained.  The CT images were loaded into the planning software.  Then, using a variety of tools including Cine, MIP, and standard views, the target volume and planning target volumes (PTV) were delineated.  Avoidance structures were contoured.  Treatment planning then occurred.  Dose volume histograms will be generated and reviewed for each of the requested structure.   I contoured the patient's left hilar/endobronchial  ITV and increased ITV with tight margins for the PTV. I will prescribe 50 Gy in 10 fractions of 5 Gy per fraction every weekday. I requested a DVH of the patient's lungs, target volumes, esophagus, heart, spinal cord and airways for 3D conformal planning.   3D conformal radiotherapy is important to minimize dose to the normal tissues listed above.  Cone beam CT scans will be performed prior to each fraction to allow close PTV margins and sparing of normal tissues from high  doses.  -----------------------------------  Eppie Gibson, MD

## 2015-03-15 NOTE — Progress Notes (Signed)
[place] Psychosocial Distress Screening Clinical Social Work  Clinical Social Work was referred by distress screening protocol.  The patient scored a 10 on the Psychosocial Distress Thermometer which indicates severe distress. Clinical Social Worker met with patient in radiation oncology lobby briefly after radiation oncology visit to assess for distress and other psychosocial needs. Ms. Lightsey was accompanied by her daughter, Gerald Stabs, whom she identified as her main support.  Gerald Stabs reported she has been her main caregiver for the past seven years and they live together.  Ms. Tamala Julian had question regarding other transportation options besides Medicaid and [employer].  CSW provided information on [facility] [facility] transportation service that patient can utilize while receiving treatment. Ms. Tamala Julian stated her information concerns were addressed after meeting with Dr. Isidore Moos today and patient/daughter both felt they heard "good news" today during visit.  CSW briefly discussed patient's emotional causes of distress.  Patient shared she had history of anxiety/depression, but repeatedly only discussed her chronic pain.  Patient is seen at [facility] off [address]  She plans to contact the pain clinic to address her pain- she shared she is unable to cope with anxiety, depression, and other emotional concerns because her pain is unbearable.  CSW discussed meeting with a counselor to help learn tools to cope with pain in additional to emotional needs.  Patient agreed to [facility] counseling intern referral.  CSW will make referral and encouraged patient to call CSW with any questions or concerns.  ONCBCN DISTRESS SCREENING 03/15/2015  Screening Type Initial Screening  Distress experienced in past week (1-10) 10  Practical problem type Housing;Transportation;Food  Family Problem type Partner;Children  Emotional problem type Depression;Nervousness/Anxiety;Adjusting to illness;Isolation/feeling  alone;Feeling hopeless;Boredom;Adjusting to appearance changes  Spiritual/Religous concerns type Facing my mortality;Loss of sense of purpose  Information Concerns Type Lack of info about diagnosis;Lack of info about treatment;Lack of info about complementary therapy choices;Lack of info about maintaining fitness  Physical Problem type Pain;Sleep/insomnia;Getting around;Breathing;Talking;Skin dry/itchy;Swollen arms/legs  Physician notified of physical symptoms Yes  Referral to clinical social work Yes   Polo Riley, MSW, LCSW, OSW-C Clinical Social Worker [facility] [phone]

## 2015-03-15 NOTE — Addendum Note (Signed)
Encounter addended by: Ernst Spell, RN on: 03/15/2015  9:48 AM<BR>     Documentation filed: Charges VN

## 2015-03-18 ENCOUNTER — Ambulatory Visit (HOSPITAL_COMMUNITY): Payer: Medicare Other

## 2015-03-18 ENCOUNTER — Other Ambulatory Visit: Payer: Self-pay | Admitting: *Deleted

## 2015-03-18 NOTE — Patient Outreach (Signed)
[facility] [facility]) Care Management  03/18/2015  Kelsey Garcia 07-Feb-1946 [id number]   Transition of care (week 3) and RED FLAG EMMI Pneumonia  RN spoke with pt and confirmed that pt is feeling much better. Pt states she is "okay" and breathing much better with no SOB or wheezing today. States she is standing outside awaiting transportation from this doctor's office. Pt confirms she continues to have some swelling to her lower legs but her provider has placed her on extra Lasix. States she will start her Cancer treatment in one week but overall she is felling much better. RN once again extended community case management with a home visit as pt continues to decline this offer but remains receptive to ongoing telephonic follow up on how she is doing. RN offered to follow up next Monday however if she goes in the RED zone RN will once again follow up sooner to inquire on her status. Pt verbralized an understanding and receptive to the telephone calls. No other inquires or request at this time.   [name], RN Care Management Coordinator [place] Office [phone]

## 2015-03-19 ENCOUNTER — Ambulatory Visit: Payer: Medicare Other | Admitting: Gastroenterology

## 2015-03-20 ENCOUNTER — Other Ambulatory Visit: Payer: Self-pay | Admitting: *Deleted

## 2015-03-20 NOTE — Patient Outreach (Signed)
[place] [facility]) Care Management  03/20/2015  Kelsey Garcia Jul 05, 1945 [id number]    CSW received a new referral on patient from Kelsey Garcia, [facility] with [facility], reporting that patient would benefit from social work services and resources to assist with counseling and supportive services.  More specifically, Kelsey Garcia indicated that patient was recently diagnosed with cancer and is overwhelmed with signs, symptoms, treatment plan, etc.  Not to mention, patient is requesting housing/shelter resources for her daughter and granddaughter in the event that something happens to her.  Patient is currently contemplating a palliative care consult. CSW made an initial attempt to try and contact patient today to perform phone assessment, as well as assess and assist with social work needs and services, without success.  A HIPAA compliant message was left on voicemail for patient.  CSW is currently awaiting a return call.  Kelsey Garcia, BSW, MSW, LCSW  Licensed Education officer, environmental Health System  Mailing [place] [address] [place], [place] [address] Physical Address-[address], [place], [place] [address] Toll Free Main # [phone] Fax # [phone] Cell # [phone]  Fax # [phone]  [email or web address]    [place] complies with [employer] civil rights laws and does not discriminate on the basis of race, color, national origin, age, disability, or sex.  Espaol (Spanish)  [place] cumple con las leyes federales de derechos civiles aplicables y no discrimina por motivos de raza, color, nacionalidad, edad, discapacidad o sexo.     Ti?ng Vi?t ([place])  [place] tun th? lu?t dn quy?n hi?n hnh c?a Lin bang v khng phn bi?t ?i x? d?a trn ch?ng t?c, mu da, ngu?n g?c qu?c gia, ? tu?i, khuy?t t?t, ho?c gi?i tnh.      (Arabic)    [place]

## 2015-03-22 DIAGNOSIS — Z510 Encounter for antineoplastic radiation therapy: Secondary | ICD-10-CM | POA: Diagnosis not present

## 2015-03-25 ENCOUNTER — Ambulatory Visit
Admission: RE | Admit: 2015-03-25 | Discharge: 2015-03-25 | Disposition: A | Discharge status: Home / Self Care | Payer: Medicare Other | Source: Ambulatory Visit | Attending: Radiation Oncology | Admitting: Radiation Oncology

## 2015-03-25 ENCOUNTER — Encounter: Payer: Self-pay | Admitting: Radiation Oncology

## 2015-03-25 VITALS — BP 93/41 | HR 66 | Temp 98.2°F | Ht 65.0 in | Wt 168.4 lb

## 2015-03-25 DIAGNOSIS — C3410 Malignant neoplasm of upper lobe, unspecified bronchus or lung: Secondary | ICD-10-CM | POA: Diagnosis present

## 2015-03-25 DIAGNOSIS — Z510 Encounter for antineoplastic radiation therapy: Secondary | ICD-10-CM | POA: Diagnosis not present

## 2015-03-25 DIAGNOSIS — C3490 Malignant neoplasm of unspecified part of unspecified bronchus or lung: Secondary | ICD-10-CM

## 2015-03-25 DIAGNOSIS — C3492 Malignant neoplasm of unspecified part of left bronchus or lung: Secondary | ICD-10-CM

## 2015-03-25 MED ORDER — SONAFINE EX EMUL
1.0000 "application " | Freq: Once | CUTANEOUS | Status: DC
  Filled 2015-03-25: qty 45

## 2015-03-25 MED ORDER — SONAFINE EX EMUL
1.0000 "application " | Freq: Once | CUTANEOUS | Status: AC
  Administered 2015-03-25: 1 via TOPICAL
  Filled 2015-03-25: qty 45

## 2015-03-25 NOTE — Progress Notes (Signed)
Kelsey Garcia is here for her first treatment of radiation to her Left Lung. She feels short of breath but feels this is related to her COPD and she uses oxygen at 2 Liters nasal cannula continuously.  She denies any pain at this time. She also has some fatigue which she also relates to her COPD. She has no other concerns at this time. She relates her weight loss to a decrease in peripheral edema. She is eating well per her and her daughters reports.   BP 93/41 mmHg  Pulse 66  Temp(Src) 98.2 F (36.8 C)  Ht '5\' 5"'$  (1.651 m)  Wt 168 lb 6.4 oz (76.386 kg)  BMI 28.02 kg/m2  SpO2 97%   Wt Readings from Last 3 Encounters:  03/25/15 168 lb 6.4 oz (76.386 kg)  03/15/15 185 lb (83.915 kg)  03/12/15 194 lb (87.998 kg)

## 2015-03-25 NOTE — Progress Notes (Signed)
   Weekly Management Note:  outpatient  C34.10 - lingula of lung   Current Dose:  5 Gy  Projected Dose: 50 Gy   Narrative:  The patient presents for routine under treatment assessment.  CBCT/MVCT images/Port film x-rays were reviewed.  The chart was checked. Doing well Attributes weight loss to fluid retention that has resolved.  Eating well.  Family confirms this.  Physical Findings:  height is '5\' 5"'$  (1.651 m) and weight is 168 lb 6.4 oz (76.386 kg). Her temperature is 98.2 F (36.8 C). Her blood pressure is 93/41 and her pulse is 66. Her oxygen saturation is 97%.   Wt Readings from Last 3 Encounters:  03/25/15 168 lb 6.4 oz (76.386 kg)  03/15/15 185 lb (83.915 kg)  03/12/15 194 lb (87.998 kg)   NAD, O2 per [place] In wheelchair  Impression:  The patient is tolerating radiotherapy.  Plan:  Continue radiotherapy as planned. I have asked nursing to recheck her BP again later this week. If it's still <021 systolic or <11 diastolic, I asked nursing to please call her PCP to see if they want to reassess in their office and to let them know it's possible this is affected by the benzodiazepines she takes prior to RT.  But, if BP still low in their office they may want to decrease doses of her BP meds   ________________________________   Eppie Gibson, M.D.

## 2015-03-25 NOTE — Progress Notes (Signed)
Pt here for patient teaching.  Pt given Radiation and You booklet, skin care instructions and Sonafine. Pt reports they have not watched the Radiation Therapy Education video, but were given the link to watch at home.  Reviewed areas of pertinence such as fatigue, hair loss, skin changes, throat changes, breast swelling, cough, shortness of breath, earaches and taste changes . Pt able to give teach back of to pat skin, use unscented/gentle soap and drink plenty of water,apply Sonafine bid, avoid applying anything to skin within 4 hours of treatment and to use an electric razor if they must shave. Pt verbalizes understanding of information given and will contact nursing with any questions or concerns.     Http://rtanswers.org/treatmentinformation/whattoexpect/index

## 2015-03-26 ENCOUNTER — Ambulatory Visit
Admission: RE | Admit: 2015-03-26 | Discharge: 2015-03-26 | Disposition: A | Discharge status: Home / Self Care | Payer: Medicare Other | Source: Ambulatory Visit | Attending: Radiation Oncology | Admitting: Radiation Oncology

## 2015-03-26 ENCOUNTER — Ambulatory Visit: Payer: Medicare Other | Admitting: Radiation Oncology

## 2015-03-26 DIAGNOSIS — Z510 Encounter for antineoplastic radiation therapy: Secondary | ICD-10-CM | POA: Diagnosis not present

## 2015-03-27 ENCOUNTER — Emergency Department (HOSPITAL_COMMUNITY): Payer: Medicare Other

## 2015-03-27 ENCOUNTER — Emergency Department (HOSPITAL_COMMUNITY)
Admission: EM | Admit: 2015-03-27 | Discharge: 2015-03-27 | Disposition: A | Discharge status: Home / Self Care | Payer: Medicare Other | Source: Home / Self Care | Attending: Emergency Medicine | Admitting: Emergency Medicine

## 2015-03-27 ENCOUNTER — Ambulatory Visit
Admit: 2015-03-27 | Discharge: 2015-03-27 | Disposition: A | Discharge status: Home / Self Care | Payer: Medicare Other | Attending: Radiation Oncology | Admitting: Radiation Oncology

## 2015-03-27 ENCOUNTER — Other Ambulatory Visit: Payer: Self-pay | Admitting: *Deleted

## 2015-03-27 ENCOUNTER — Encounter (HOSPITAL_COMMUNITY): Payer: Self-pay | Admitting: *Deleted

## 2015-03-27 ENCOUNTER — Encounter: Payer: Self-pay | Admitting: *Deleted

## 2015-03-27 ENCOUNTER — Ambulatory Visit: Payer: Self-pay | Admitting: *Deleted

## 2015-03-27 ENCOUNTER — Ambulatory Visit: Payer: Medicare Other | Admitting: Radiation Oncology

## 2015-03-27 DIAGNOSIS — Z8639 Personal history of other endocrine, nutritional and metabolic disease: Secondary | ICD-10-CM | POA: Insufficient documentation

## 2015-03-27 DIAGNOSIS — I10 Essential (primary) hypertension: Secondary | ICD-10-CM | POA: Insufficient documentation

## 2015-03-27 DIAGNOSIS — Z9981 Dependence on supplemental oxygen: Secondary | ICD-10-CM

## 2015-03-27 DIAGNOSIS — J9610 Chronic respiratory failure, unspecified whether with hypoxia or hypercapnia: Secondary | ICD-10-CM | POA: Diagnosis present

## 2015-03-27 DIAGNOSIS — Z79899 Other long term (current) drug therapy: Secondary | ICD-10-CM

## 2015-03-27 DIAGNOSIS — F429 Obsessive-compulsive disorder, unspecified: Secondary | ICD-10-CM | POA: Insufficient documentation

## 2015-03-27 DIAGNOSIS — G4700 Insomnia, unspecified: Secondary | ICD-10-CM | POA: Insufficient documentation

## 2015-03-27 DIAGNOSIS — C3490 Malignant neoplasm of unspecified part of unspecified bronchus or lung: Secondary | ICD-10-CM | POA: Insufficient documentation

## 2015-03-27 DIAGNOSIS — Z8781 Personal history of (healed) traumatic fracture: Secondary | ICD-10-CM | POA: Insufficient documentation

## 2015-03-27 DIAGNOSIS — Z7982 Long term (current) use of aspirin: Secondary | ICD-10-CM

## 2015-03-27 DIAGNOSIS — D72819 Decreased white blood cell count, unspecified: Secondary | ICD-10-CM | POA: Diagnosis present

## 2015-03-27 DIAGNOSIS — F329 Major depressive disorder, single episode, unspecified: Secondary | ICD-10-CM | POA: Diagnosis present

## 2015-03-27 DIAGNOSIS — R0600 Dyspnea, unspecified: Secondary | ICD-10-CM

## 2015-03-27 DIAGNOSIS — Z91018 Allergy to other foods: Secondary | ICD-10-CM

## 2015-03-27 DIAGNOSIS — Z885 Allergy status to narcotic agent status: Secondary | ICD-10-CM

## 2015-03-27 DIAGNOSIS — Z87891 Personal history of nicotine dependence: Secondary | ICD-10-CM

## 2015-03-27 DIAGNOSIS — Z825 Family history of asthma and other chronic lower respiratory diseases: Secondary | ICD-10-CM

## 2015-03-27 DIAGNOSIS — F419 Anxiety disorder, unspecified: Secondary | ICD-10-CM | POA: Diagnosis present

## 2015-03-27 DIAGNOSIS — J449 Chronic obstructive pulmonary disease, unspecified: Principal | ICD-10-CM | POA: Diagnosis present

## 2015-03-27 DIAGNOSIS — J441 Chronic obstructive pulmonary disease with (acute) exacerbation: Secondary | ICD-10-CM | POA: Insufficient documentation

## 2015-03-27 DIAGNOSIS — Z7951 Long term (current) use of inhaled steroids: Secondary | ICD-10-CM | POA: Insufficient documentation

## 2015-03-27 DIAGNOSIS — M1990 Unspecified osteoarthritis, unspecified site: Secondary | ICD-10-CM

## 2015-03-27 DIAGNOSIS — D638 Anemia in other chronic diseases classified elsewhere: Secondary | ICD-10-CM | POA: Diagnosis present

## 2015-03-27 DIAGNOSIS — Z8701 Personal history of pneumonia (recurrent): Secondary | ICD-10-CM | POA: Insufficient documentation

## 2015-03-27 DIAGNOSIS — I340 Nonrheumatic mitral (valve) insufficiency: Secondary | ICD-10-CM | POA: Insufficient documentation

## 2015-03-27 DIAGNOSIS — I509 Heart failure, unspecified: Secondary | ICD-10-CM

## 2015-03-27 DIAGNOSIS — Z7952 Long term (current) use of systemic steroids: Secondary | ICD-10-CM

## 2015-03-27 DIAGNOSIS — K219 Gastro-esophageal reflux disease without esophagitis: Secondary | ICD-10-CM

## 2015-03-27 DIAGNOSIS — I429 Cardiomyopathy, unspecified: Secondary | ICD-10-CM | POA: Diagnosis present

## 2015-03-27 DIAGNOSIS — I4891 Unspecified atrial fibrillation: Secondary | ICD-10-CM | POA: Diagnosis present

## 2015-03-27 DIAGNOSIS — G8929 Other chronic pain: Secondary | ICD-10-CM | POA: Insufficient documentation

## 2015-03-27 DIAGNOSIS — Z888 Allergy status to other drugs, medicaments and biological substances status: Secondary | ICD-10-CM

## 2015-03-27 DIAGNOSIS — Z809 Family history of malignant neoplasm, unspecified: Secondary | ICD-10-CM

## 2015-03-27 DIAGNOSIS — I5032 Chronic diastolic (congestive) heart failure: Secondary | ICD-10-CM | POA: Diagnosis present

## 2015-03-27 DIAGNOSIS — E785 Hyperlipidemia, unspecified: Secondary | ICD-10-CM | POA: Diagnosis present

## 2015-03-27 DIAGNOSIS — R0602 Shortness of breath: Secondary | ICD-10-CM | POA: Diagnosis not present

## 2015-03-27 DIAGNOSIS — Z881 Allergy status to other antibiotic agents status: Secondary | ICD-10-CM

## 2015-03-27 LAB — CBC WITH DIFFERENTIAL/PLATELET
BASOS ABS: 0 10*3/uL (ref 0.0–0.1)
BASOS PCT: 0 %
EOS ABS: 0 10*3/uL (ref 0.0–0.7)
Eosinophils Relative: 1 %
HCT: 33.3 % — ABNORMAL LOW (ref 36.0–46.0)
HEMOGLOBIN: 9.8 g/dL — AB (ref 12.0–15.0)
Lymphocytes Relative: 22 %
Lymphs Abs: 1 10*3/uL (ref 0.7–4.0)
MCH: 29.2 pg (ref 26.0–34.0)
MCHC: 29.4 g/dL — ABNORMAL LOW (ref 30.0–36.0)
MCV: 99.1 fL (ref 78.0–100.0)
Monocytes Absolute: 0.6 10*3/uL (ref 0.1–1.0)
Monocytes Relative: 14 %
NEUTROS PCT: 63 %
Neutro Abs: 2.8 10*3/uL (ref 1.7–7.7)
Platelets: 308 10*3/uL (ref 150–400)
RBC: 3.36 MIL/uL — AB (ref 3.87–5.11)
RDW: 16.7 % — ABNORMAL HIGH (ref 11.5–15.5)
WBC: 4.5 10*3/uL (ref 4.0–10.5)

## 2015-03-27 LAB — COMPREHENSIVE METABOLIC PANEL
ALT: 12 U/L — ABNORMAL LOW (ref 14–54)
ANION GAP: 7 (ref 5–15)
AST: 11 U/L — ABNORMAL LOW (ref 15–41)
Albumin: 3.2 g/dL — ABNORMAL LOW (ref 3.5–5.0)
Alkaline Phosphatase: 103 U/L (ref 38–126)
BILIRUBIN TOTAL: 1 mg/dL (ref 0.3–1.2)
BUN: 12 mg/dL (ref 6–20)
CO2: 31 mmol/L (ref 22–32)
Calcium: 8.7 mg/dL — ABNORMAL LOW (ref 8.9–10.3)
Chloride: 101 mmol/L (ref 101–111)
Creatinine, Ser: 0.64 mg/dL (ref 0.44–1.00)
GFR calc Af Amer: >60 mL/min (ref >60)
GFR calc non Af Amer: >60 mL/min (ref >60)
GLUCOSE: 128 mg/dL — AB (ref 65–99)
POTASSIUM: 5.3 mmol/L — AB (ref 3.5–5.1)
Sodium: 139 mmol/L (ref 135–145)
TOTAL PROTEIN: 5.7 g/dL — AB (ref 6.5–8.1)

## 2015-03-27 LAB — LIPASE, BLOOD: LIPASE: 19 U/L (ref 11–51)

## 2015-03-27 LAB — BRAIN NATRIURETIC PEPTIDE: B NATRIURETIC PEPTIDE 5: 227.9 pg/mL — AB (ref 0.0–100.0)

## 2015-03-27 MED ORDER — HYDROMORPHONE HCL 1 MG/ML IJ SOLN
1.0000 mg | Freq: Once | INTRAMUSCULAR | Status: AC
  Administered 2015-03-27: 1 mg via INTRAVENOUS
  Filled 2015-03-27: qty 1

## 2015-03-27 MED ORDER — ONDANSETRON HCL 4 MG/2ML IJ SOLN
4.0000 mg | Freq: Once | INTRAMUSCULAR | Status: AC
  Administered 2015-03-27: 4 mg via INTRAVENOUS
  Filled 2015-03-27: qty 2

## 2015-03-27 NOTE — Patient Outreach (Signed)
[facility]) Care Management  03/25/2015  Kelsey Garcia [birth year] [id number] LATE ENTRY  Transition of care (week 4) Case Closure  RN spoke with pt today and inquired on her ongoing management of care. Pt reports her initial treatment day for her recent diagnosis of cancer, states it went very well. RN inquired on the swelling as pt reports much better with extra medicine to assist with her fluid retention.  RN inquired about her breathing as pt reports she is breathing well with no problems and is in there GREEN zone when questioned on there COPD action plan. Pt in good spirits and reports she is managing her health with no problems. RN extended the offer for community involved with home visits however pt has opt to decline. RN also extended the offer for ongoing services with [facility] for a health coach as pt also declined this offer indicating she is doing well and does not need [facility] services at this time.  RN will close this case via [facility] services at this time.  [name], RN Care Management Coordinator [place] Office [phone]

## 2015-03-27 NOTE — ED Notes (Signed)
Pt admits to recent history of "not feeling well," weakness and nausea - pt w/ lung CA and currently receiving radiation tx. Pt was referred to ER for eval - pt was to receive radiation at 9am this morning. Pt on 2L/min [place] oxygen and currently has Sp02 between 85-91%, pt admits her baseline is normally 95%.

## 2015-03-27 NOTE — Patient Outreach (Signed)
[facility] [facility]) Care Management  03/27/2015  Kelsey Garcia [birth year] [id number]   CSW made a second attempt to try and contact patient today to perform phone assessment, as well as assess and assist with social work needs and services, without success.  A HIPAA compliant message was left for patient on voicemail.  CSW is currently awaiting a return call.  After thorough review of patient's EMR (Electronic Medical Record) in EPIC, CSW noted that patient was being seen in the Emergency Department at [facility] due to symptoms associated with radiation treatment for Lung Cancer.  CSW will continue to try and make contact with patient to offer social work services.  Nat Christen, BSW, MSW, LCSW  Licensed Education officer, environmental Health System  Mailing [place] [address] [place], [place] [address] Physical Address-[address], [place], [place] [address] Toll Free Main # [phone] Fax # [phone] Cell # [phone]  Fax # [phone]  [email or web address]

## 2015-03-27 NOTE — ED Provider Notes (Signed)
CSN: [id number]     Arrival date & time 03/27/15  0998 History   First MD Initiated Contact with Patient 03/27/15 [phone]     Chief Complaint  Patient presents with  . Nausea  . Lung Cancer     (Consider location/radiation/quality/duration/timing/severity/associated sxs/prior Treatment) HPI Patient presents with concern of fatigue, weakness, nausea. Symptoms have increased over the past day. No new pain, though the patient acknowledges chronic pain in multiple areas, including her shoulder. No new chest pain. precipitant for her worsening weakness, nausea. No new vomiting, diarrhea, though she feels as though she can do each of these. Patient is undergoing radiation therapy for lung cancer and was scheduled for radiation therapy today. She acknowledges a long history of smoking, but stopped recently.  Past Medical History  Diagnosis Date  . Atrial fibrillation ([place])   . Hypertension   . Insomnia   . Nonischemic cardiomyopathy ([place])   . Hyperlipidemia   . Anxiety   . Cholelithiasis   . Insomnia   . Mitral regurgitation     noted 2010  . H/O epistaxis   . Rhinitis, allergic   . CHF (congestive heart failure) ([place])   . Pneumonia     "several times w/exacerbations of the COPD; nothing in the last year" (07/15/2012)  . COPD (chronic obstructive pulmonary disease) ([place])     as of 7/13 on 2-3L, pfts 10/2008 with mod obstruction  . Chronic bronchitis with COPD (chronic obstructive pulmonary disease) ([place])   . Shortness of breath     "all the time right now" (07/15/2012)  . GERD (gastroesophageal reflux disease)   . NKNLZJQB(341.9)     "weekly" (07/15/2012)  . Migraines     "weekly for awhile; cleared up as I got older" (07/15/2012)  . DJD (degenerative joint disease)   . Arthritis     "all over" (07/15/2012)  . OCD (obsessive compulsive disorder)   . OCD (obsessive compulsive disorder)   . Depression     h/o SI; "last time I was really serious about it was ~ 1997" (07/15/2012)  .  Thoracic vertebral fracture ([place]) 11/03/2014  . Primary lung cancer [facility])    Past Surgical History  Procedure Laterality Date  . Tubal ligation  1972  . Cardioversion  2003; 07/2003  . Video bronchoscopy Bilateral 02/27/2015    Procedure: VIDEO BRONCHOSCOPY WITH FLUORO;  Surgeon: Chesley Mires, MD;  Location: [facility] ENDOSCOPY;  Service: Cardiopulmonary;  Laterality: Bilateral;   Family History  Problem Relation Age of Onset  . Asthma    . Emphysema    . Allergies    . Cancer      aunt had several types of cancer  . COPD Mother   . Emphysema Mother   . Cirrhosis Father    Social History  Substance Use Topics  . Smoking status: Former Smoker -- 1.00 packs/day for 35 years    Types: Cigarettes    Quit date: 06/18/2014  . Smokeless tobacco: Never Used  . Alcohol Use: No   OB History    No data available     Review of Systems  Constitutional:       Per HPI, otherwise negative  HENT:       Per HPI, otherwise negative  Respiratory:       Per HPI, otherwise negative  Cardiovascular:       Per HPI, otherwise negative  Gastrointestinal: Positive for nausea. Negative for vomiting.  Endocrine:       Negative aside from HPI  Genitourinary:       Neg aside from HPI   Musculoskeletal:       Per HPI, otherwise negative  Skin: Negative.   Neurological: Positive for weakness. Negative for syncope.      Allergies  Ace inhibitors; Codeine; Pseudoeph-doxylamine-dm-apap; Diphenhydramine hcl; Erythromycin; Nsaids; Nyquil; Tramadol hcl; Fosamax; Tomato; Orange fruit; and Pseudoephedrine  Home Medications   Prior to Admission medications   Medication Sig Start Date End Date Taking? Authorizing Provider  albuterol (PROVENTIL) (2.5 MG/3ML) 0.083% nebulizer solution Take 3 mLs (2.5 mg total) by nebulization every 6 (six) hours as needed for wheezing. 09/21/14  Yes Maryann Mikhail, DO  aspirin 325 MG EC tablet Take 650 mg by mouth daily with breakfast.    Yes Historical Provider, MD   busPIRone (BUSPAR) 10 MG tablet Take 10 mg by mouth 3 (three) times daily. 04/22/14  Yes Historical Provider, MD  diazepam (VALIUM) 2 MG tablet Take 1 tablet (2 mg total) by mouth every 6 (six) hours as needed for anxiety. 03/05/15  Yes Donne Hazel, MD  diltiazem (CARDIZEM) 120 MG tablet Take 1 tablet (120 mg total) by mouth every 8 (eight) hours. 03/12/15  Yes Rigoberto Noel, MD  FLUoxetine (PROZAC) 20 MG capsule TAKE THREE CAPSULES BY MOUTH ONCE DAILY Patient taking differently: Take 60 mg by mouth daily.  01/03/14  Yes Tasrif Ahmed, MD  furosemide (LASIX) 20 MG tablet Take 40 mg by mouth daily.    Yes Historical Provider, MD  guaiFENesin (MUCINEX) 600 MG 12 hr tablet Take 600 mg by mouth 2 (two) times daily as needed for cough.   Yes Historical Provider, MD  HYDROcodone-acetaminophen (NORCO) 10-325 MG tablet Take 1 tablet by mouth every 6 (six) hours as needed for moderate pain (Pain or Dyspnea). 03/12/15  Yes Rigoberto Noel, MD  HYDROcodone-homatropine (HYCODAN) 5-1.5 MG/5ML syrup Take 2.5 mLs by mouth 2 (two) times daily as needed for cough. 03/05/15  Yes Donne Hazel, MD  ipratropium-albuterol (DUONEB) 0.5-2.5 (3) MG/3ML SOLN Take 3 mLs by nebulization 3 (three) times daily. Patient taking differently: Take 3 mLs by nebulization at bedtime.  03/07/15  Yes Tammy S Parrett, NP  LORazepam (ATIVAN) 1 MG tablet Tale 1 tab 30 minutes prior to radiotherapy for anxiety. 03/15/15  Yes Eppie Gibson, MD  metoprolol succinate (TOPROL-XL) 25 MG 24 hr tablet Take 1 tablet (25 mg total) by mouth daily. Patient taking differently: Take 12.5-25 mg by mouth daily.  02/14/15  Yes Brittainy Erie Noe, PA-C  NON FORMULARY Place 1 drop into both eyes 2 (two) times daily as needed (hurting, dry). thera- tears   Yes Historical Provider, MD  pantoprazole (PROTONIX) 40 MG tablet TAKE ONE TABLET BY MOUTH TWICE DAILY 01/03/14  Yes Tasrif Ahmed, MD  predniSONE (DELTASONE) 5 MG tablet Take 1 tablet (5 mg total) by mouth daily  with breakfast. 03/06/15  Yes Donne Hazel, MD  SPIRIVA HANDIHALER 18 MCG inhalation capsule PLACE ONE CAPSULE INTO INHALER AND INHALE DAILY 03/07/15  Yes Rigoberto Noel, MD  SYMBICORT 160-4.5 MCG/ACT inhaler INHALE TWO PUFFS BY MOUTH TWICE DAILY 02/06/15  Yes Rigoberto Noel, MD  VENTOLIN HFA 108 (90 Base) MCG/ACT inhaler INHALE ONE TO TWO PUFFS BY MOUTH EVERY 6 HOURS AS NEEDED FOR WHEEZING 03/07/15  Yes Rigoberto Noel, MD  Wound Dressings (SONAFINE) Apply 1 application topically 3 (three) times daily as needed (for radiation).    Yes Historical Provider, MD   BP 102/51 mmHg  Pulse 81  Temp(Src) 98.4 F (36.9 C) (Oral)  Resp 18  SpO2 94% Physical Exam  Constitutional: She is oriented to person, place, and time. She appears well-developed and well-nourished. No distress.  HENT:  Head: Normocephalic and atraumatic.  Eyes: Conjunctivae and EOM are normal.  Cardiovascular: Normal rate and regular rhythm.   Pulmonary/Chest: Effort normal. No stridor. Tachypnea noted. No respiratory distress. She has wheezes.  Abdominal: She exhibits no distension.  Musculoskeletal: She exhibits no edema.  Neurological: She is alert and oriented to person, place, and time. No cranial nerve deficit.  Skin: Skin is warm and dry.  Psychiatric: She has a normal mood and affect.  Nursing note and vitals reviewed.   ED Course  Procedures (including critical care time) Labs Review Labs Reviewed  COMPREHENSIVE METABOLIC PANEL - Abnormal; Notable for the following:    Potassium 5.3 (*)    Glucose, Bld 128 (*)    Calcium 8.7 (*)    Total Protein 5.7 (*)    Albumin 3.2 (*)    AST 11 (*)    ALT 12 (*)    All other components within normal limits  BRAIN NATRIURETIC PEPTIDE - Abnormal; Notable for the following:    B Natriuretic Peptide 227.9 (*)    All other components within normal limits  CBC WITH DIFFERENTIAL/PLATELET - Abnormal; Notable for the following:    RBC 3.36 (*)    Hemoglobin 9.8 (*)    HCT 33.3  (*)    MCHC 29.4 (*)    RDW 16.7 (*)    All other components within normal limits  LIPASE, BLOOD    Imaging Review Dg Chest 2 View  03/27/2015  CLINICAL DATA:  Worsening shortness of breath, cough, chest congestion and history of lung cancer. EXAM: CHEST  2 VIEW COMPARISON:  02/27/2015 FINDINGS: The heart is enlarged. There is atherosclerosis an unfolding of the aorta. Mild chronic density in the lingula as seen previously. Remainder of the lungs are clear. No effusions. There are partial compression fractures in the upper to mid thoracic spine affecting T3, T5 and T6 as seen previously. No evidence of progression or new fracture. IMPRESSION: Mild persistent patchy density in the lingula consistent with the patient's known disease. The remainder of the chest is clear. Atherosclerosis of the aorta. Compression fractures T3, T5 and T6 as seen previously Electronically Signed   By: Nelson Chimes M.D.   On: 03/27/2015 10:14   I have personally reviewed and evaluated these images and lab results as part of my medical decision-making.   12:24 PM On repeat exam patient is in no distress, sitting upright, still requiring supplemental oxygen, but speaking clearly.  We discussed all findings as far, including mild hypo-calcemic, hyperkalemia. I arranged for the patient to proceed from here to radiation therapy   MDM   Final diagnoses:  Dyspnea  Patient with history of CHF, COPD, ongoing therapy for lung cancer now presents with dyspnea, generalized complaints. Here, the patient is awake and alert.  She does require supplemental oxygen, though this is unchanged from baseline and quantity. No evidence for new pneumonia, bacteremia, sepsis. Patient's labs consistent with prior studies for her aside from mild hyperkalemia. Given the reassuring findings we discussed the patient admission for possible worsening COPD versus discharged directly to oncology for additional radiation therapy. This was  arranged, and the patient was discharged in stable condition.   Carmin Muskrat, MD 03/27/15 1226

## 2015-03-27 NOTE — Progress Notes (Signed)
Called by Raquel Sarna (Radiation Therapist), as Kelsey Garcia still had an IV in place from the Emergency Department from which she was just discharged from. I removed the peripheral IV which was in her Right Lower Forearm, 22 Gauge, and pressure held and a dressing placed with paper tape. The catheter was intact upon removal, and the patient tolerated the procedure well. She was brought to the lobby where she is waiting on a family member to pick her up.

## 2015-03-28 ENCOUNTER — Ambulatory Visit: Admission: RE | Admit: 2015-03-28 | Payer: Medicare Other | Source: Ambulatory Visit | Admitting: Radiation Oncology

## 2015-03-28 ENCOUNTER — Emergency Department (HOSPITAL_COMMUNITY)
Admission: EM | Admit: 2015-03-28 | Discharge: 2015-03-28 | Disposition: A | Discharge status: Home / Self Care | Payer: Medicare Other | Source: Home / Self Care | Attending: Emergency Medicine | Admitting: Emergency Medicine

## 2015-03-28 ENCOUNTER — Other Ambulatory Visit: Payer: Self-pay | Admitting: *Deleted

## 2015-03-28 ENCOUNTER — Emergency Department (HOSPITAL_COMMUNITY): Payer: Medicare Other

## 2015-03-28 ENCOUNTER — Ambulatory Visit
Admit: 2015-03-28 | Discharge: 2015-03-28 | Disposition: A | Discharge status: Home / Self Care | Payer: Medicare Other | Attending: Radiation Oncology | Admitting: Radiation Oncology

## 2015-03-28 ENCOUNTER — Encounter (HOSPITAL_COMMUNITY): Payer: Self-pay | Admitting: Emergency Medicine

## 2015-03-28 ENCOUNTER — Telehealth: Payer: Self-pay

## 2015-03-28 DIAGNOSIS — R42 Dizziness and giddiness: Secondary | ICD-10-CM

## 2015-03-28 DIAGNOSIS — K219 Gastro-esophageal reflux disease without esophagitis: Secondary | ICD-10-CM

## 2015-03-28 DIAGNOSIS — I10 Essential (primary) hypertension: Secondary | ICD-10-CM

## 2015-03-28 DIAGNOSIS — Z8781 Personal history of (healed) traumatic fracture: Secondary | ICD-10-CM | POA: Insufficient documentation

## 2015-03-28 DIAGNOSIS — Z7982 Long term (current) use of aspirin: Secondary | ICD-10-CM | POA: Insufficient documentation

## 2015-03-28 DIAGNOSIS — J441 Chronic obstructive pulmonary disease with (acute) exacerbation: Secondary | ICD-10-CM

## 2015-03-28 DIAGNOSIS — Z85118 Personal history of other malignant neoplasm of bronchus and lung: Secondary | ICD-10-CM | POA: Insufficient documentation

## 2015-03-28 DIAGNOSIS — R5383 Other fatigue: Secondary | ICD-10-CM | POA: Insufficient documentation

## 2015-03-28 DIAGNOSIS — I4891 Unspecified atrial fibrillation: Secondary | ICD-10-CM

## 2015-03-28 DIAGNOSIS — F419 Anxiety disorder, unspecified: Secondary | ICD-10-CM | POA: Insufficient documentation

## 2015-03-28 DIAGNOSIS — G4700 Insomnia, unspecified: Secondary | ICD-10-CM

## 2015-03-28 DIAGNOSIS — M1990 Unspecified osteoarthritis, unspecified site: Secondary | ICD-10-CM | POA: Insufficient documentation

## 2015-03-28 DIAGNOSIS — Z7952 Long term (current) use of systemic steroids: Secondary | ICD-10-CM | POA: Insufficient documentation

## 2015-03-28 DIAGNOSIS — Z87891 Personal history of nicotine dependence: Secondary | ICD-10-CM | POA: Insufficient documentation

## 2015-03-28 DIAGNOSIS — Z8701 Personal history of pneumonia (recurrent): Secondary | ICD-10-CM | POA: Insufficient documentation

## 2015-03-28 DIAGNOSIS — I509 Heart failure, unspecified: Secondary | ICD-10-CM | POA: Insufficient documentation

## 2015-03-28 DIAGNOSIS — Z7951 Long term (current) use of inhaled steroids: Secondary | ICD-10-CM | POA: Insufficient documentation

## 2015-03-28 DIAGNOSIS — Z8639 Personal history of other endocrine, nutritional and metabolic disease: Secondary | ICD-10-CM

## 2015-03-28 DIAGNOSIS — Z79899 Other long term (current) drug therapy: Secondary | ICD-10-CM

## 2015-03-28 DIAGNOSIS — R05 Cough: Secondary | ICD-10-CM

## 2015-03-28 DIAGNOSIS — I340 Nonrheumatic mitral (valve) insufficiency: Secondary | ICD-10-CM

## 2015-03-28 LAB — CBC
HCT: 34.3 % — ABNORMAL LOW (ref 36.0–46.0)
HEMOGLOBIN: 10 g/dL — AB (ref 12.0–15.0)
MCH: 29 pg (ref 26.0–34.0)
MCHC: 29.2 g/dL — ABNORMAL LOW (ref 30.0–36.0)
MCV: 99.4 fL (ref 78.0–100.0)
PLATELETS: 283 10*3/uL (ref 150–400)
RBC: 3.45 MIL/uL — AB (ref 3.87–5.11)
RDW: 16.5 % — ABNORMAL HIGH (ref 11.5–15.5)
WBC: 3.8 10*3/uL — ABNORMAL LOW (ref 4.0–10.5)

## 2015-03-28 LAB — URINALYSIS, ROUTINE W REFLEX MICROSCOPIC
BILIRUBIN URINE: NEGATIVE
Glucose, UA: NEGATIVE mg/dL
HGB URINE DIPSTICK: NEGATIVE
Ketones, ur: NEGATIVE mg/dL
Leukocytes, UA: NEGATIVE
NITRITE: NEGATIVE
PROTEIN: NEGATIVE mg/dL
Specific Gravity, Urine: 1.017 (ref 1.005–1.030)
pH: 8 (ref 5.0–8.0)

## 2015-03-28 LAB — FUNGUS CULTURE W SMEAR

## 2015-03-28 LAB — BASIC METABOLIC PANEL
ANION GAP: 6 (ref 5–15)
BUN: 14 mg/dL (ref 6–20)
CALCIUM: 8.4 mg/dL — AB (ref 8.9–10.3)
CO2: 33 mmol/L — AB (ref 22–32)
CREATININE: 0.54 mg/dL (ref 0.44–1.00)
Chloride: 101 mmol/L (ref 101–111)
Glucose, Bld: 112 mg/dL — ABNORMAL HIGH (ref 65–99)
Potassium: 4.7 mmol/L (ref 3.5–5.1)
SODIUM: 140 mmol/L (ref 135–145)

## 2015-03-28 MED ORDER — SODIUM CHLORIDE 0.9 % IV BOLUS (SEPSIS)
500.0000 mL | Freq: Once | INTRAVENOUS | Status: AC
  Administered 2015-03-28: 500 mL via INTRAVENOUS

## 2015-03-28 MED ORDER — HYDROCODONE-ACETAMINOPHEN 5-325 MG PO TABS
1.0000 | ORAL_TABLET | Freq: Once | ORAL | Status: AC
  Administered 2015-03-28: 1 via ORAL
  Filled 2015-03-28: qty 1

## 2015-03-28 MED ORDER — MECLIZINE HCL 25 MG PO TABS
25.0000 mg | ORAL_TABLET | Freq: Once | ORAL | Status: AC
  Administered 2015-03-28: 25 mg via ORAL
  Filled 2015-03-28: qty 1

## 2015-03-28 NOTE — Telephone Encounter (Addendum)
I received a phone call from Ms. Nault's daughter this am. She reports that Ms. Dayton has been confused, pulling off her nasal cannula since her return home from the ED and radiation treatment yesterday. She has not taken any of her medicines since yesterday. She reports Ms. Gessel just does not seem like herself. The daughter has called EMS and plans to have her evaluated in the ED again today. She reports Ms. Aden waited for a long time in the lobby for a ride to pick her up yesterday, and became frustrated.

## 2015-03-28 NOTE — ED Notes (Signed)
Per EMS: Pt was dx with lung cancer 3 wks ago.  Just started radiation this Monday.  Pt supposed to go to radiation once a day for 10 days.  States that yesterday, she felt tired and worn out.  States that she went to radiation anyway and was also given 2 doses of morphine.  Since they gave her the morphine, states that today has been worse.  States she has been delusional and not herself.  Pt concerned about rt hand.  Discoloration/bruising noted to rt hand.  Does not remember anything that would cause that.

## 2015-03-28 NOTE — Discharge Instructions (Signed)
Fatigue Fatigue is feeling tired all of the time, a lack of energy, or a lack of motivation. Occasional or mild fatigue is often a normal response to activity or life in general. However, long-lasting (chronic) or extreme fatigue may indicate an underlying medical condition. HOME CARE INSTRUCTIONS  Watch your fatigue for any changes. The following actions may help to lessen any discomfort you are feeling:  Talk to your health care provider about how much sleep you need each night. Try to get the required amount every night.  Take medicines only as directed by your health care provider.  Eat a healthy and nutritious diet. Ask your health care provider if you need help changing your diet.  Drink enough fluid to keep your urine clear or pale yellow.  Practice ways of relaxing, such as yoga, meditation, massage therapy, or acupuncture.  Exercise regularly.   Change situations that cause you stress. Try to keep your work and personal routine reasonable.  Do not abuse illegal drugs.  Limit alcohol intake to no more than 1 drink per day for nonpregnant women and 2 drinks per day for men. One drink equals 12 ounces of beer, 5 ounces of wine, or 1 ounces of hard liquor.  Take a multivitamin, if directed by your health care provider. SEEK MEDICAL CARE IF:   Your fatigue does not get better.  You have a fever.   You have unintentional weight loss or gain.  You have headaches.   You have difficulty:   Falling asleep.  Sleeping throughout the night.  You feel angry, guilty, anxious, or sad.   You are unable to have a bowel movement (constipation).   You skin is dry.   Your legs or another part of your body is swollen.  SEEK IMMEDIATE MEDICAL CARE IF:   You feel confused.   Your vision is blurry.  You feel faint or pass out.   You have a severe headache.   You have severe abdominal, pelvic, or back pain.   You have chest pain, shortness of breath, or an  irregular or fast heartbeat.   You are unable to urinate or you urinate less than normal.   You develop abnormal bleeding, such as bleeding from the rectum, vagina, nose, lungs, or nipples.  You vomit blood.   You have thoughts about harming yourself or committing suicide.   You are worried that you might harm someone else.    This information is not intended to replace advice given to you by your health care provider. Make sure you discuss any questions you have with your health care provider.   Document Released: 11/23/2006 Document Revised: 02/16/2014 Document Reviewed: 05/30/2013 Elsevier Interactive Patient Education 2016 Elsevier Inc. External Beam Radiation Therapy External beam radiation therapy is a radiation treatment. It may be done to:  Treat cancer. The radiation may be used to:  Destroy cancer cells. Radiation delivered during the treatment damages cancer cells. It also damages normal cells, but normal cells have the DNA to repair themselves while cancer cells do not.  Help with symptoms of your cancer.  Stop the growth of any remaining cancer cells after surgery.  Prevent cancer cells from growing in areas that do not have evidence of cancer (prophylactic radiation therapy).  Treat or shrink a tumor.  Reduce pain (palliative therapy). The therapy delivers higher doses of radiation than X-rays, CT scans, and most other imaging tests. Compared with internal radiation therapy, external beam radiation therapy can deliver radiation to a fairly  large area.  The amount of radiation you will receive and the length of therapy depends on your medical condition. You should not feel the radiation being delivered or any pain during your therapy. RISKS AND COMPLICATIONS Most people experience side effects from the therapy. Side effects depend on the amount of radiation and the part of your body exposed to radiation. For example:  Hair loss may occur if the radiation  therapy is directed to your head.  Coughing or difficulty swallowing may occur if the radiation therapy is directed to your head, neck, or chest.  Nausea, vomiting, or diarrhea may occur if the radiation therapy is directed to your abdomen or pelvis.  Bladder problems, frequent urination, or sexual dysfunction may occur if the radiation therapy is directed to your bladder, kidney, or prostate. Regardless of the amount or location of the radiation, you will probably have fatigue. Other side effects may include:  Red, flaking skin in the affected area.  Hair loss in the affected area.  Itching in the affected area. Side effects may take 2-3 weeks to develop. Most side effects are temporary and can be controlled. Once the therapy is complete, side effects will not stop right away. It can take up to 3-4 weeks for you to regain your energy or for side effects to lessen. Your body does heal from the radiation. BEFORE THE PROCEDURE There will be a planning session (simulation). During the session:  Your health care provider will plan exactly where the radiation will be delivered (treatment field).  You will be positioned for your therapy. The goal is to have a position that can be reproduced for each therapy session.  Temporary marks may be drawn on your body. Permanent marks may also be drawn on your body in order for you to be positioned the same way for each therapy session. PROCEDURE  You will either lie on a table or sit in a chair in the position determined for your therapy.  The radiation machine (linear accelerator) will move around you to deliver the radiation in exact doses from many angles. AFTER THE PROCEDURE You may return to your normal schedule including diet, activities, and medicines, unless your health care provider tells you otherwise.   This information is not intended to replace advice given to you by your health care provider. Make sure you discuss any questions you have  with your health care provider.   Document Released: 06/14/2008 Document Revised: 10/17/2014 Document Reviewed: 01/04/2013 Elsevier Interactive Patient Education [employer].

## 2015-03-28 NOTE — ED Notes (Signed)
Bed: WHALD Expected date:  Expected time:  Means of arrival:  Comments: 

## 2015-03-28 NOTE — ED Notes (Signed)
Bed: WA21 Expected date:  Expected time:  Means of arrival:  Comments: EMS Ca pt AMS

## 2015-03-28 NOTE — ED Provider Notes (Signed)
CSN: [id number]     Arrival date & time 03/28/15  1113 History   First MD Initiated Contact with Patient 03/28/15 1132     Chief Complaint  Patient presents with  . Weakness    Kelsey Garcia is a 70 y.o. female with a recent diagnosis of bronchogenic carcinoma 3 weeks ago who presents to the emergency department complaining of worsening generalized fatigue since yesterday. The patient just started radiation therapy 4 days ago. She reports she should be feeling fatigued from the radiation, but she reports she is feeling more fatigued than she expected. She reports waking up this morning and could hardly get out of bed. She reports generalized weakness without focal weakness. She reports is improved throughout the day. She also reports feeling lightheaded and dizzy upon position change and standing today. She reports it feels she is spinning around the room and she has had trouble dialing the phone today. She lives at home with her daughter who helps her at home. She denies focal weakness. She denies worsening cough or shortness of breath. She is on oxygen at home. She reports last night she was not acting herself according to her daughter. Her daughter reported she was being rude and declining food and drink rudely. She is due to have radiation therapy once a day for 10 days. She did not have radiation therapy yet today. The patient denies fevers, focal weakness, headache, changes to her vision, neck stiffness, neck pain, vomiting, diarrhea, abdominal pain, chest pain, worsening shortness of breath, urinary symptoms, or rashes.  The history is provided by the patient. No language interpreter was used.    Past Medical History  Diagnosis Date  . Atrial fibrillation ([place])   . Hypertension   . Insomnia   . Nonischemic cardiomyopathy ([place])   . Hyperlipidemia   . Anxiety   . Cholelithiasis   . Insomnia   . Mitral regurgitation     noted 2010  . H/O epistaxis   . Rhinitis, allergic   . CHF  (congestive heart failure) ([place])   . Pneumonia     "several times w/exacerbations of the COPD; nothing in the last year" (07/15/2012)  . COPD (chronic obstructive pulmonary disease) ([place])     as of 7/13 on 2-3L, pfts 10/2008 with mod obstruction  . Chronic bronchitis with COPD (chronic obstructive pulmonary disease) ([place])   . Shortness of breath     "all the time right now" (07/15/2012)  . GERD (gastroesophageal reflux disease)   . CLEXNTZG(017.4)     "weekly" (07/15/2012)  . Migraines     "weekly for awhile; cleared up as I got older" (07/15/2012)  . DJD (degenerative joint disease)   . Arthritis     "all over" (07/15/2012)  . OCD (obsessive compulsive disorder)   . OCD (obsessive compulsive disorder)   . Depression     h/o SI; "last time I was really serious about it was ~ 1997" (07/15/2012)  . Thoracic vertebral fracture ([place]) 11/03/2014  . Primary lung cancer [facility])    Past Surgical History  Procedure Laterality Date  . Tubal ligation  1972  . Cardioversion  2003; 07/2003  . Video bronchoscopy Bilateral 02/27/2015    Procedure: VIDEO BRONCHOSCOPY WITH FLUORO;  Surgeon: Chesley Mires, MD;  Location: [facility];  Service: Cardiopulmonary;  Laterality: Bilateral;   Family History  Problem Relation Age of Onset  . Asthma    . Emphysema    . Allergies    . Cancer  aunt had several types of cancer  . COPD Mother   . Emphysema Mother   . Cirrhosis Father    Social History  Substance Use Topics  . Smoking status: Former Smoker -- 1.00 packs/day for 35 years    Types: Cigarettes    Quit date: 06/18/2014  . Smokeless tobacco: Never Used  . Alcohol Use: No   OB History    No data available     Review of Systems  Constitutional: Positive for fatigue. Negative for fever and chills.  HENT: Negative for congestion and sore throat.   Eyes: Negative for visual disturbance.  Respiratory: Positive for cough and shortness of breath (chronic ). Negative for chest tightness and wheezing.    Cardiovascular: Negative for chest pain and palpitations.  Gastrointestinal: Negative for nausea, vomiting, abdominal pain and diarrhea.  Genitourinary: Negative for dysuria, urgency and frequency.  Musculoskeletal: Negative for neck pain and neck stiffness.  Skin: Negative for rash.  Neurological: Positive for dizziness and light-headedness. Negative for seizures, syncope, speech difficulty, weakness, numbness and headaches.      Allergies  Ace inhibitors; Codeine; Pseudoeph-doxylamine-dm-apap; Diphenhydramine hcl; Erythromycin; Nsaids; Nyquil; Tramadol hcl; Fosamax; Tomato; Orange fruit; and Pseudoephedrine  Home Medications   Prior to Admission medications   Medication Sig Start Date End Date Taking? Authorizing Provider  albuterol (PROVENTIL) (2.5 MG/3ML) 0.083% nebulizer solution Take 3 mLs (2.5 mg total) by nebulization every 6 (six) hours as needed for wheezing. 09/21/14  Yes Maryann Mikhail, DO  aspirin 325 MG EC tablet Take 650 mg by mouth daily with breakfast.    Yes Historical Provider, MD  busPIRone (BUSPAR) 10 MG tablet Take 10 mg by mouth 3 (three) times daily. 04/22/14  Yes Historical Provider, MD  diazepam (VALIUM) 2 MG tablet Take 1 tablet (2 mg total) by mouth every 6 (six) hours as needed for anxiety. 03/05/15  Yes Donne Hazel, MD  diltiazem (CARDIZEM) 120 MG tablet Take 1 tablet (120 mg total) by mouth every 8 (eight) hours. 03/12/15  Yes Rigoberto Noel, MD  FLUoxetine (PROZAC) 20 MG capsule TAKE THREE CAPSULES BY MOUTH ONCE DAILY Patient taking differently: Take 60 mg by mouth daily.  01/03/14  Yes Tasrif Ahmed, MD  furosemide (LASIX) 20 MG tablet Take 40 mg by mouth daily.    Yes Historical Provider, MD  guaiFENesin (MUCINEX) 600 MG 12 hr tablet Take 600 mg by mouth 2 (two) times daily as needed for cough.   Yes Historical Provider, MD  HYDROcodone-acetaminophen (NORCO) 10-325 MG tablet Take 1 tablet by mouth every 6 (six) hours as needed for moderate pain (Pain or  Dyspnea). 03/12/15  Yes Rigoberto Noel, MD  HYDROcodone-homatropine (HYCODAN) 5-1.5 MG/5ML syrup Take 2.5 mLs by mouth 2 (two) times daily as needed for cough. 03/05/15  Yes Donne Hazel, MD  ipratropium-albuterol (DUONEB) 0.5-2.5 (3) MG/3ML SOLN Take 3 mLs by nebulization 3 (three) times daily. 03/07/15  Yes Tammy S Parrett, NP  LORazepam (ATIVAN) 1 MG tablet Tale 1 tab 30 minutes prior to radiotherapy for anxiety. 03/15/15  Yes Eppie Gibson, MD  metoprolol succinate (TOPROL-XL) 25 MG 24 hr tablet Take 1 tablet (25 mg total) by mouth daily. 02/14/15  Yes Brittainy Erie Noe, PA-C  NON FORMULARY Place 1 drop into both eyes 2 (two) times daily as needed (hurting, dry). thera- tears   Yes Historical Provider, MD  pantoprazole (PROTONIX) 40 MG tablet TAKE ONE TABLET BY MOUTH TWICE DAILY 01/03/14  Yes Dellia Nims, MD  [facility] Michaell Cowing  18 MCG inhalation capsule PLACE ONE CAPSULE INTO INHALER AND INHALE DAILY 03/07/15  Yes Rigoberto Noel, MD  SYMBICORT 160-4.5 MCG/ACT inhaler INHALE TWO PUFFS BY MOUTH TWICE DAILY 02/06/15  Yes Rigoberto Noel, MD  VENTOLIN HFA 108 (90 Base) MCG/ACT inhaler INHALE ONE TO TWO PUFFS BY MOUTH EVERY 6 HOURS AS NEEDED FOR WHEEZING 03/07/15  Yes Rigoberto Noel, MD  Wound Dressings (SONAFINE) Apply 1 application topically 3 (three) times daily as needed (for radiation).    Yes Historical Provider, MD  predniSONE (DELTASONE) 5 MG tablet Take 1 tablet (5 mg total) by mouth daily with breakfast. 03/06/15   Donne Hazel, MD   BP 158/131 mmHg  Pulse 88  Temp(Src) 97.6 F (36.4 C) (Oral)  Resp 18  SpO2 95% Physical Exam  Constitutional: She is oriented to person, place, and time. She appears well-developed and well-nourished. No distress.  Nontoxic appearing.  HENT:  Head: Normocephalic and atraumatic.  Right Ear: External ear normal.  Left Ear: External ear normal.  Mouth/Throat: Oropharynx is clear and moist.  Mucous membranes are slightly dry.  Eyes: Conjunctivae and EOM are  normal. Pupils are equal, round, and reactive to light. Right eye exhibits no discharge. Left eye exhibits no discharge.  Neck: Normal range of motion. Neck supple. No JVD present. No tracheal deviation present.  Cardiovascular: Normal rate, regular rhythm, normal heart sounds and intact distal pulses.  Exam reveals no gallop and no friction rub.   No murmur heard. Pulmonary/Chest: Effort normal. No respiratory distress. She has no wheezes. She has no rales. She exhibits no tenderness.  Lung sounds are slightly diminished bilaterally. No rhonchi or stridor. No wheezes.   Abdominal: Soft. She exhibits no distension. There is no tenderness. There is no guarding.  Musculoskeletal: She exhibits no edema.  Lymphadenopathy:    She has no cervical adenopathy.  Neurological: She is alert and oriented to person, place, and time. No cranial nerve deficit.  She is alert and oriented 3. Cranial nerves are intact. Finger to nose is intact but she has some difficulty. Sensation is intact in her bilateral upper extremities. Patient is able to stand but reports feeling dizzy upon standing. Speech is clear and coherent. Vision is grossly intact.  Skin: Skin is warm and dry. No rash noted. She is not diaphoretic. No erythema. No pallor.  Psychiatric: She has a normal mood and affect. Her behavior is normal.  Nursing note and vitals reviewed.   ED Course  Procedures (including critical care time) Labs Review Labs Reviewed  BASIC METABOLIC PANEL - Abnormal; Notable for the following:    CO2 33 (*)    Glucose, Bld 112 (*)    Calcium 8.4 (*)    All other components within normal limits  CBC - Abnormal; Notable for the following:    WBC 3.8 (*)    RBC 3.45 (*)    Hemoglobin 10.0 (*)    HCT 34.3 (*)    MCHC 29.2 (*)    RDW 16.5 (*)    All other components within normal limits  URINALYSIS, ROUTINE W REFLEX MICROSCOPIC (NOT AT [facility]) - Abnormal; Notable for the following:    APPearance CLOUDY (*)    All  other components within normal limits    Imaging Review Dg Chest 2 View  03/27/2015  CLINICAL DATA:  Worsening shortness of breath, cough, chest congestion and history of lung cancer. EXAM: CHEST  2 VIEW COMPARISON:  02/27/2015 FINDINGS: The heart is enlarged. There is  atherosclerosis an unfolding of the aorta. Mild chronic density in the lingula as seen previously. Remainder of the lungs are clear. No effusions. There are partial compression fractures in the upper to mid thoracic spine affecting T3, T5 and T6 as seen previously. No evidence of progression or new fracture. IMPRESSION: Mild persistent patchy density in the lingula consistent with the patient's known disease. The remainder of the chest is clear. Atherosclerosis of the aorta. Compression fractures T3, T5 and T6 as seen previously Electronically Signed   By: Nelson Chimes M.D.   On: 03/27/2015 10:14   Ct Head Wo Contrast  03/28/2015  CLINICAL DATA:  Confusion. Altered mental status. Dizziness. Lung cancer. EXAM: CT HEAD WITHOUT CONTRAST TECHNIQUE: Contiguous axial images were obtained from the base of the skull through the vertex without intravenous contrast. COMPARISON:  Multiple exams, including 02/28/2015 FINDINGS: Central pontine hypodensity is partially attributable to to streak artifact and partially attributable to chronic ischemic microvascular white matter disease. Otherwise, the brainstem, cerebellum, cerebral peduncles, thalami, basal ganglia, basilar cisterns, and ventricular system appear within normal limits. Periventricular white matter and corona radiata hypodensities favor chronic ischemic microvascular white matter disease. No intracranial hemorrhage, mass lesion, or acute CVA. IMPRESSION: 1. No acute intracranial findings to explain the patient's altered mental status and confusion. 2. Evidence of chronic is a ischemic microvascular white matter disease involving the periventricular white matter and the pons, not appreciably  changed from recent prior exams. Electronically Signed   By: Van Clines M.D.   On: 03/28/2015 13:24   I have personally reviewed and evaluated these lab results as part of my medical decision-making.   EKG Interpretation   Date/Time:  Thursday March 28 2015 11:39:13 EST Ventricular Rate:  84 PR Interval:    QRS Duration: 91 QT Interval:  391 QTC Calculation: 462 R Axis:   3 Text Interpretation:  Atrial fibrillation Anteroseptal infarct, old  Confirmed by PICKERING  MD, NATHAN [phone]) on 03/28/2015 3:44:43 PM      Filed Vitals:   03/28/15 1127 03/28/15 1456 03/28/15 1459 03/28/15 1542  BP: 100/81  132/75 158/131  Pulse: 81 83  88  Temp: 97.8 F (36.6 C) 97.6 F (36.4 C)    TempSrc: Oral Oral    Resp: '14 17  18  '$ SpO2: 99% 100%  95%     MDM   Meds given in ED:  Medications  sodium chloride 0.9 % bolus 500 mL (0 mLs Intravenous Stopped 03/28/15 1507)  meclizine (ANTIVERT) tablet 25 mg (25 mg Oral Given 03/28/15 1237)  HYDROcodone-acetaminophen (NORCO/VICODIN) 5-325 MG per tablet 1 tablet (1 tablet Oral Given 03/28/15 1456)    New Prescriptions   No medications on file    Final diagnoses:  Other fatigue   This is a 70 y.o. female with a recent diagnosis of bronchogenic carcinoma 3 weeks ago who presents to the emergency department complaining of worsening generalized fatigue since yesterday. The patient just started radiation therapy 4 days ago. She reports she should be feeling fatigued from the radiation, but she reports she is feeling more fatigued than she expected. She reports waking up this morning and could hardly get out of bed. She reports generalized weakness without focal weakness. She reports is improved throughout the day. She also reports feeling lightheaded and dizzy upon position change and standing today. On exam the patient is afebrile and nontoxic appearing. She has no focal neurological deficits. She does have some slowness to finger to nose. There  is nose still  intact. Heart and saturations 100% on her nasal cannula at 2 L per min. Patient is on oxygen at home. BP on arrival was slightly low with a pressure 100/81. While I am in the room room patient has a blood pressure of 130/80. She denies worsening shortness of breath or cough.  Will provide with fluid bolus and check CT to rule out new mets. She had an MRI last month that showed no mets. 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  CT head showed no acute intracranial findings. Urinalysis is unremarkable. No signs of infection. Patient's hemoglobin has improved since her last visit. No leukocytosis. Patient reports feeling better after fluid bolus. She has tolerated crackers and sprite. She does not feel lightheaded with position change. Will discharge with close follow-up by her oncologist. I suspect her fatigue is related to her recent start of radiation therapy. I advised the patient to follow-up with their primary care provider this week. I advised the patient to return to the emergency department with new or worsening symptoms or new concerns. The patient verbalized understanding and agreement with plan.    This patient was discussed with Dr. Alvino Chapel who agrees with assessment and plan.    Waynetta Pean, PA-C 03/28/15 1621  Davonna Belling, MD 03/29/15 [phone]

## 2015-03-28 NOTE — Patient Outreach (Signed)
[place] [facility]) Care Management  03/28/2015  Kelsey Garcia [birth year] [id number]   CSW received a call from [name], [facility] with [place] [facility], reporting that she was able to converse with patient, who reports no nursing or social work needs through [facility], at present.  [name] indicated that she was planning to close patient's case, encouraging CSW to do the same.  After thorough review of patient's EMR (Electronic Medical Record) in [employer], CSW noted that patient is actively involved with Polo Riley, Licensed Clinical Social Worker with the [facility].  Ms. Tarry Kos was able to meet with patient for the initial visit on March 15, 2015, when patient scored a 10 on the Psychosocial Distress Thermometer, indicating severe distress.  Patient agreed to counseling and supportive services to help cope with feelings of depression and anxiety, so a referral was made to the [facility] counseling intern.  While present, Ms. Tarry Kos was able to address the following concerns with patient: Transportation, Housing and [employer]. No additional social work needs identified at present.  Nat Christen, BSW, MSW, LCSW  Licensed Education officer, [facility]  Mailing [place] [address] [place], [place] [address] Physical Address-[address], [place], [place] [address] Toll Free Main # [phone] Fax # [phone] Cell # [phone]  Fax # [phone]  [email or web address]

## 2015-03-29 ENCOUNTER — Ambulatory Visit: Payer: Medicare Other | Admitting: Radiation Oncology

## 2015-03-29 ENCOUNTER — Telehealth: Payer: Self-pay | Admitting: General Practice

## 2015-03-29 ENCOUNTER — Telehealth: Payer: Self-pay

## 2015-03-29 NOTE — Telephone Encounter (Signed)
I called Kelsey Garcia to check on her well being. She was discharged from the ED yesterday 03/28/15 (and the day before). She has not arrived for her radiation treatment today. I left a voice mail to call me back to let me know how she was doing.

## 2015-03-29 NOTE — Progress Notes (Signed)
Kelsey Garcia spoke with the radiation therapists today regarding her treatment. She requested afternoon appointments which they were able to do for her. She also asked for assistance with transportation to these appointments. They emailed me asking for my help in this matter. I contacted Polo Riley SW to ask for her assistance. She stated she would call Kelsey Garcia to try to help her. I have informed the Therapist via email of the above.

## 2015-03-30 ENCOUNTER — Encounter (HOSPITAL_COMMUNITY): Payer: Self-pay | Admitting: Emergency Medicine

## 2015-03-30 ENCOUNTER — Inpatient Hospital Stay (HOSPITAL_COMMUNITY): Payer: Medicare Other

## 2015-03-30 ENCOUNTER — Inpatient Hospital Stay (HOSPITAL_COMMUNITY)
Admission: EM | Admit: 2015-03-30 | Discharge: 2015-04-02 | DRG: 191 | Disposition: A | Discharge status: Home Health | Payer: Medicare Other | Attending: Internal Medicine | Admitting: Internal Medicine

## 2015-03-30 ENCOUNTER — Emergency Department (HOSPITAL_COMMUNITY): Payer: Medicare Other

## 2015-03-30 DIAGNOSIS — Z9981 Dependence on supplemental oxygen: Secondary | ICD-10-CM | POA: Diagnosis not present

## 2015-03-30 DIAGNOSIS — R0602 Shortness of breath: Secondary | ICD-10-CM | POA: Diagnosis present

## 2015-03-30 DIAGNOSIS — Z881 Allergy status to other antibiotic agents status: Secondary | ICD-10-CM | POA: Diagnosis not present

## 2015-03-30 DIAGNOSIS — C3490 Malignant neoplasm of unspecified part of unspecified bronchus or lung: Secondary | ICD-10-CM | POA: Diagnosis present

## 2015-03-30 DIAGNOSIS — Z885 Allergy status to narcotic agent status: Secondary | ICD-10-CM | POA: Diagnosis not present

## 2015-03-30 DIAGNOSIS — Z91018 Allergy to other foods: Secondary | ICD-10-CM | POA: Diagnosis not present

## 2015-03-30 DIAGNOSIS — I4891 Unspecified atrial fibrillation: Secondary | ICD-10-CM | POA: Diagnosis present

## 2015-03-30 DIAGNOSIS — I340 Nonrheumatic mitral (valve) insufficiency: Secondary | ICD-10-CM | POA: Diagnosis present

## 2015-03-30 DIAGNOSIS — F329 Major depressive disorder, single episode, unspecified: Secondary | ICD-10-CM | POA: Diagnosis present

## 2015-03-30 DIAGNOSIS — Z809 Family history of malignant neoplasm, unspecified: Secondary | ICD-10-CM | POA: Diagnosis not present

## 2015-03-30 DIAGNOSIS — Z7952 Long term (current) use of systemic steroids: Secondary | ICD-10-CM | POA: Diagnosis not present

## 2015-03-30 DIAGNOSIS — Z7982 Long term (current) use of aspirin: Secondary | ICD-10-CM | POA: Diagnosis not present

## 2015-03-30 DIAGNOSIS — R5382 Chronic fatigue, unspecified: Secondary | ICD-10-CM | POA: Diagnosis not present

## 2015-03-30 DIAGNOSIS — F429 Obsessive-compulsive disorder, unspecified: Secondary | ICD-10-CM | POA: Diagnosis present

## 2015-03-30 DIAGNOSIS — Z79899 Other long term (current) drug therapy: Secondary | ICD-10-CM | POA: Diagnosis not present

## 2015-03-30 DIAGNOSIS — I429 Cardiomyopathy, unspecified: Secondary | ICD-10-CM | POA: Diagnosis present

## 2015-03-30 DIAGNOSIS — D638 Anemia in other chronic diseases classified elsewhere: Secondary | ICD-10-CM | POA: Diagnosis present

## 2015-03-30 DIAGNOSIS — I480 Paroxysmal atrial fibrillation: Secondary | ICD-10-CM

## 2015-03-30 DIAGNOSIS — Z888 Allergy status to other drugs, medicaments and biological substances status: Secondary | ICD-10-CM | POA: Diagnosis not present

## 2015-03-30 DIAGNOSIS — I10 Essential (primary) hypertension: Secondary | ICD-10-CM | POA: Diagnosis present

## 2015-03-30 DIAGNOSIS — Z825 Family history of asthma and other chronic lower respiratory diseases: Secondary | ICD-10-CM | POA: Diagnosis not present

## 2015-03-30 DIAGNOSIS — Z87891 Personal history of nicotine dependence: Secondary | ICD-10-CM | POA: Diagnosis not present

## 2015-03-30 DIAGNOSIS — I5032 Chronic diastolic (congestive) heart failure: Secondary | ICD-10-CM

## 2015-03-30 DIAGNOSIS — F419 Anxiety disorder, unspecified: Secondary | ICD-10-CM | POA: Diagnosis present

## 2015-03-30 DIAGNOSIS — J449 Chronic obstructive pulmonary disease, unspecified: Secondary | ICD-10-CM | POA: Diagnosis present

## 2015-03-30 DIAGNOSIS — K219 Gastro-esophageal reflux disease without esophagitis: Secondary | ICD-10-CM | POA: Diagnosis present

## 2015-03-30 DIAGNOSIS — J9610 Chronic respiratory failure, unspecified whether with hypoxia or hypercapnia: Secondary | ICD-10-CM | POA: Diagnosis present

## 2015-03-30 DIAGNOSIS — I482 Chronic atrial fibrillation: Secondary | ICD-10-CM | POA: Diagnosis not present

## 2015-03-30 DIAGNOSIS — E785 Hyperlipidemia, unspecified: Secondary | ICD-10-CM | POA: Diagnosis present

## 2015-03-30 DIAGNOSIS — M1990 Unspecified osteoarthritis, unspecified site: Secondary | ICD-10-CM | POA: Diagnosis present

## 2015-03-30 DIAGNOSIS — J441 Chronic obstructive pulmonary disease with (acute) exacerbation: Secondary | ICD-10-CM

## 2015-03-30 DIAGNOSIS — D72819 Decreased white blood cell count, unspecified: Secondary | ICD-10-CM | POA: Diagnosis present

## 2015-03-30 LAB — CBC WITH DIFFERENTIAL/PLATELET
BASOS ABS: 0 10*3/uL (ref 0.0–0.1)
Basophils Relative: 0 %
EOS PCT: 1 %
Eosinophils Absolute: 0 10*3/uL (ref 0.0–0.7)
HEMATOCRIT: 36.2 % (ref 36.0–46.0)
HEMOGLOBIN: 10.9 g/dL — AB (ref 12.0–15.0)
LYMPHS PCT: 26 %
Lymphs Abs: 1 10*3/uL (ref 0.7–4.0)
MCH: 29.5 pg (ref 26.0–34.0)
MCHC: 30.1 g/dL (ref 30.0–36.0)
MCV: 98.1 fL (ref 78.0–100.0)
Monocytes Absolute: 0.2 10*3/uL (ref 0.1–1.0)
Monocytes Relative: 6 %
NEUTROS ABS: 2.6 10*3/uL (ref 1.7–7.7)
NEUTROS PCT: 67 %
PLATELETS: 275 10*3/uL (ref 150–400)
RBC: 3.69 MIL/uL — AB (ref 3.87–5.11)
RDW: 16 % — ABNORMAL HIGH (ref 11.5–15.5)
WBC: 3.9 10*3/uL — AB (ref 4.0–10.5)

## 2015-03-30 LAB — EXPECTORATED SPUTUM ASSESSMENT W GRAM STAIN, RFLX TO RESP C: Special Requests: NORMAL

## 2015-03-30 LAB — BASIC METABOLIC PANEL
ANION GAP: 8 (ref 5–15)
BUN: 7 mg/dL (ref 6–20)
CO2: 34 mmol/L — ABNORMAL HIGH (ref 22–32)
Calcium: 9 mg/dL (ref 8.9–10.3)
Chloride: 101 mmol/L (ref 101–111)
Creatinine, Ser: 0.51 mg/dL (ref 0.44–1.00)
Glucose, Bld: 114 mg/dL — ABNORMAL HIGH (ref 65–99)
POTASSIUM: 4.5 mmol/L (ref 3.5–5.1)
SODIUM: 143 mmol/L (ref 135–145)

## 2015-03-30 LAB — EXPECTORATED SPUTUM ASSESSMENT W REFEX TO RESP CULTURE

## 2015-03-30 LAB — CBC
HEMATOCRIT: 31.5 % — AB (ref 36.0–46.0)
HEMOGLOBIN: 9.6 g/dL — AB (ref 12.0–15.0)
MCH: 29.5 pg (ref 26.0–34.0)
MCHC: 30.5 g/dL (ref 30.0–36.0)
MCV: 96.9 fL (ref 78.0–100.0)
Platelets: 261 10*3/uL (ref 150–400)
RBC: 3.25 MIL/uL — ABNORMAL LOW (ref 3.87–5.11)
RDW: 16 % — ABNORMAL HIGH (ref 11.5–15.5)
WBC: 2.8 10*3/uL — ABNORMAL LOW (ref 4.0–10.5)

## 2015-03-30 LAB — TSH: TSH: 0.058 u[IU]/mL — AB (ref 0.350–4.500)

## 2015-03-30 LAB — CREATININE, SERUM
Creatinine, Ser: 0.69 mg/dL (ref 0.44–1.00)
GFR calc Af Amer: >60 mL/min (ref >60)
GFR calc non Af Amer: >60 mL/min (ref >60)

## 2015-03-30 LAB — TROPONIN I

## 2015-03-30 MED ORDER — SONAFINE EX EMUL: 1.0000 "application " | Freq: Three times a day (TID) | CUTANEOUS | Status: DC | PRN

## 2015-03-30 MED ORDER — ENOXAPARIN SODIUM 40 MG/0.4ML ~~LOC~~ SOLN
40.0000 mg | SUBCUTANEOUS | Status: DC
  Administered 2015-03-30 – 2015-04-01 (×3): 40 mg via SUBCUTANEOUS
  Filled 2015-03-30 (×3): qty 0.4

## 2015-03-30 MED ORDER — ONDANSETRON HCL 4 MG/2ML IJ SOLN: 4.0000 mg | Freq: Four times a day (QID) | INTRAMUSCULAR | Status: DC | PRN

## 2015-03-30 MED ORDER — FUROSEMIDE 40 MG PO TABS
40.0000 mg | ORAL_TABLET | Freq: Every day | ORAL | Status: DC
  Administered 2015-04-01 – 2015-04-02 (×2): 40 mg via ORAL
  Filled 2015-03-30 (×4): qty 1

## 2015-03-30 MED ORDER — METOPROLOL TARTRATE 1 MG/ML IV SOLN
5.0000 mg | Freq: Four times a day (QID) | INTRAVENOUS | Status: DC | PRN
  Administered 2015-03-30: 5 mg via INTRAVENOUS
  Filled 2015-03-30: qty 5

## 2015-03-30 MED ORDER — SODIUM CHLORIDE 0.9 % IV BOLUS (SEPSIS): 500.0000 mL | Freq: Once | INTRAVENOUS | Status: DC

## 2015-03-30 MED ORDER — IPRATROPIUM-ALBUTEROL 0.5-2.5 (3) MG/3ML IN SOLN
3.0000 mL | Freq: Three times a day (TID) | RESPIRATORY_TRACT | Status: DC
Start: 2015-03-30 — End: 2015-03-30

## 2015-03-30 MED ORDER — BUDESONIDE-FORMOTEROL FUMARATE 160-4.5 MCG/ACT IN AERO: 2.0000 | INHALATION_SPRAY | Freq: Two times a day (BID) | RESPIRATORY_TRACT | Status: DC

## 2015-03-30 MED ORDER — METOPROLOL SUCCINATE ER 25 MG PO TB24
25.0000 mg | ORAL_TABLET | Freq: Every day | ORAL | Status: DC
  Administered 2015-03-30 – 2015-04-02 (×4): 25 mg via ORAL
  Filled 2015-03-30 (×4): qty 1

## 2015-03-30 MED ORDER — HYDROCODONE-ACETAMINOPHEN 10-325 MG PO TABS
1.0000 | ORAL_TABLET | Freq: Four times a day (QID) | ORAL | Status: DC | PRN
  Administered 2015-03-30 – 2015-04-02 (×10): 1 via ORAL
  Filled 2015-03-30 (×10): qty 1

## 2015-03-30 MED ORDER — ALBUTEROL SULFATE (2.5 MG/3ML) 0.083% IN NEBU: 2.5000 mg | INHALATION_SOLUTION | Freq: Four times a day (QID) | RESPIRATORY_TRACT | Status: DC | PRN

## 2015-03-30 MED ORDER — HYDROCODONE-HOMATROPINE 5-1.5 MG/5ML PO SYRP
2.5000 mL | ORAL_SOLUTION | Freq: Two times a day (BID) | ORAL | Status: DC | PRN
  Administered 2015-03-31 – 2015-04-02 (×4): 2.5 mL via ORAL
  Filled 2015-03-30 (×4): qty 5

## 2015-03-30 MED ORDER — SODIUM CHLORIDE 0.9 % IV SOLN
INTRAVENOUS | Status: DC
  Administered 2015-03-30 – 2015-03-31 (×4): via INTRAVENOUS

## 2015-03-30 MED ORDER — LEVALBUTEROL HCL 1.25 MG/0.5ML IN NEBU
1.2500 mg | INHALATION_SOLUTION | Freq: Four times a day (QID) | RESPIRATORY_TRACT | Status: DC
  Administered 2015-03-31: 1.25 mg via RESPIRATORY_TRACT
  Filled 2015-03-30 (×6): qty 0.5

## 2015-03-30 MED ORDER — METOPROLOL TARTRATE 1 MG/ML IV SOLN
5.0000 mg | Freq: Once | INTRAVENOUS | Status: AC
  Administered 2015-03-30: 5 mg via INTRAVENOUS
  Filled 2015-03-30: qty 5

## 2015-03-30 MED ORDER — BUSPIRONE HCL 5 MG PO TABS
10.0000 mg | ORAL_TABLET | Freq: Three times a day (TID) | ORAL | Status: DC
  Administered 2015-03-30 – 2015-04-02 (×9): 10 mg via ORAL
  Filled 2015-03-30 (×9): qty 2

## 2015-03-30 MED ORDER — TIOTROPIUM BROMIDE MONOHYDRATE 18 MCG IN CAPS
18.0000 ug | ORAL_CAPSULE | Freq: Every day | RESPIRATORY_TRACT | Status: DC
  Administered 2015-03-31 – 2015-04-02 (×3): 18 ug via RESPIRATORY_TRACT
  Filled 2015-03-30: qty 5

## 2015-03-30 MED ORDER — DILTIAZEM HCL 60 MG PO TABS
120.0000 mg | ORAL_TABLET | Freq: Three times a day (TID) | ORAL | Status: DC
  Administered 2015-03-30 – 2015-04-02 (×9): 120 mg via ORAL
  Filled 2015-03-30 (×9): qty 2

## 2015-03-30 MED ORDER — FLUOXETINE HCL 20 MG PO CAPS
60.0000 mg | ORAL_CAPSULE | Freq: Every day | ORAL | Status: DC
  Administered 2015-03-31 – 2015-04-02 (×3): 60 mg via ORAL
  Filled 2015-03-30 (×4): qty 3

## 2015-03-30 MED ORDER — PANTOPRAZOLE SODIUM 40 MG PO TBEC
40.0000 mg | DELAYED_RELEASE_TABLET | Freq: Every day | ORAL | Status: DC
  Administered 2015-03-31 – 2015-04-02 (×3): 40 mg via ORAL
  Filled 2015-03-30 (×3): qty 1

## 2015-03-30 MED ORDER — ASPIRIN EC 325 MG PO TBEC
650.0000 mg | DELAYED_RELEASE_TABLET | Freq: Every day | ORAL | Status: DC
  Administered 2015-03-31 – 2015-04-02 (×3): 650 mg via ORAL
  Filled 2015-03-30 (×3): qty 2

## 2015-03-30 MED ORDER — SODIUM CHLORIDE 0.9 % IV BOLUS (SEPSIS)
1000.0000 mL | Freq: Once | INTRAVENOUS | Status: AC
  Administered 2015-03-30: 1000 mL via INTRAVENOUS

## 2015-03-30 MED ORDER — DIAZEPAM 2 MG PO TABS: 2.0000 mg | ORAL_TABLET | Freq: Four times a day (QID) | ORAL | Status: DC | PRN

## 2015-03-30 MED ORDER — ONDANSETRON HCL 4 MG PO TABS: 4.0000 mg | ORAL_TABLET | Freq: Four times a day (QID) | ORAL | Status: DC | PRN

## 2015-03-30 MED ORDER — MOMETASONE FURO-FORMOTEROL FUM 200-5 MCG/ACT IN AERO
2.0000 | INHALATION_SPRAY | Freq: Two times a day (BID) | RESPIRATORY_TRACT | Status: DC
  Administered 2015-04-01 – 2015-04-02 (×2): 2 via RESPIRATORY_TRACT
  Filled 2015-03-30 (×2): qty 8.8

## 2015-03-30 MED ORDER — GUAIFENESIN ER 600 MG PO TB12: 600.0000 mg | ORAL_TABLET | Freq: Two times a day (BID) | ORAL | Status: DC | PRN

## 2015-03-30 MED ORDER — IOHEXOL 350 MG/ML SOLN
100.0000 mL | Freq: Once | INTRAVENOUS | Status: AC | PRN
Start: 2015-03-30 — End: 2015-03-30
  Administered 2015-03-30: 100 mL via INTRAVENOUS

## 2015-03-30 NOTE — ED Notes (Signed)
Pt attempted to ambulate 3 times and was unsuccessful, pt became dizzy upon standing and said "it feels like my legs are going to give out"

## 2015-03-30 NOTE — ED Notes (Signed)
Pt had water and crackers with no problems

## 2015-03-30 NOTE — ED Notes (Signed)
Pt to CT

## 2015-03-30 NOTE — Progress Notes (Signed)
MD notified of patient's telemetry reading A-Fib with HR 125-135. Patient denies SOB and weakness. PO daily metoprolol and Cardizem administered as ordered by MD. Per MD, page MD again if HR does not decrease below 115. Will make on-coming RN aware.

## 2015-03-30 NOTE — ED Notes (Signed)
Pt requesting pain medication.  

## 2015-03-30 NOTE — ED Notes (Addendum)
Per EMS pt complaint of SOB; hx of lung cancer and recent radiation treatment. Pt given 10 mg of albuterol, 0.5 mg of atrovent, and 125 mg of solu medrol en route with EMS.

## 2015-03-30 NOTE — H&P (Addendum)
[facility] History and Physical  ELA MOFFAT OVZ:[id number] DOB: September 12, 1945 DOA: 03/30/2015  Referring physician: ED physician, Dr. Eulis Foster PCP: Darden Amber, PA   Chief Complaint: dyspnea   HPI:  70 y.o. female with history of lung cancer, who presents for evaluation of fatigue and shortness of breath, several weeks in duration, associated with some mid chest area pressure, no specific alleviating or aggravating factors, no fevers, no chills, no abd or urinary concerns. The last 2 days. Is evaluated in the emergency department on 03/27/2015 and 03/28/2015 for this and was sent home but has not gotten much better.  In ED, pt noted to be hemodynamically stable but in a-fib with RVR, HR in 120's. TRH asked to admit for further evaluation. Telemetry unit requested in case Cardizem drip needed.   Assessment and Plan: Active Problems:   Atrial fibrillation with RVR (HCC), chronic, CHADS 2 score 4 - admit to tele  - place on metoprolol IV as needed - HR is more stable at this time but if continues trending up, place on cardizem drip - CT chest to rule out PE - has refused AC in the past and still refusin     Chronic respiratory failure ([place]) due to COPD  - on oxygen at home at all times 2-3 L [place] - stable at this time     Essential hypertension - Cont cardizem, metoprolol    Depression and anxiety  - Thus far stable, no suicidal or homicidal ideations. - Continued home medications: BuSpar, Prozac    Chronic diastolic (congestive) heart failure ([place])  - 2-D echo on 03/07/14 showed EF 50-60%. Patient does not have leg edema, CHF compensated.    Squamous Cell Lung CA - Patient was seen by Radiation Oncology with recs for outpatient PET and follow up RT    Leukopenia - mild, monitor    Anemia of chronic disease, IDA - no signs of bleeding   Lovenox SQ for DVT prophylaxis   Radiological Exams on Admission: Dg Chest 2 View  03/30/2015  CLINICAL DATA:  History of lung  cancer and COPD. Shortness of breath. EXAM: CHEST  2 VIEW COMPARISON:  03/27/2015 and 02/27/2015 FINDINGS: Lungs are adequately inflated with mild interval improvement of hazy lingular airspace density. No evidence of effusion or pneumothorax. Mild stable cardiomegaly. Calcified plaque over the thoracic aorta. Stable known mild compression of several mid to upper thoracic vertebral bodies. IMPRESSION: Slight interval improvement in hazy lingular airspace density. Stable known thoracic spine compression fractures. Stable cardiomegaly. Electronically Signed   By: Marin Olp M.D.   On: 03/30/2015 14:52     Code Status: Full Family Communication: Pt at bedside Disposition Plan: Admit for further evaluation    Mart Piggs [facility] [phone]   Review of Systems:  Constitutional: Negative for fever, chills and malaise/fatigue. Negative for diaphoresis.  HENT: Negative for hearing loss, ear pain, nosebleeds, congestion, sore throat, neck pain, tinnitus and ear discharge.   Eyes: Negative for blurred vision, double vision, photophobia, pain, discharge and redness.  Respiratory: Negative for cough,  wheezing and stridor.   Cardiovascular: Negative for claudication and leg swelling.  Gastrointestinal: Negative for nausea, vomiting and abdominal pain.  Genitourinary: Negative for dysuria, urgency, frequency, hematuria and flank pain.  Musculoskeletal: Negative for myalgias, back pain, joint pain and falls.  Skin: Negative for itching and rash.  Neurological: Negative for dizziness and weakness.  Endo/Heme/Allergies: Negative for environmental allergies and polydipsia. Does not bruise/bleed easily.  Psychiatric/Behavioral: Negative for suicidal ideas. The  patient is not nervous/anxious.      Past Medical History  Diagnosis Date  . Atrial fibrillation ([place])   . Hypertension   . Insomnia   . Nonischemic cardiomyopathy ([place])   . Hyperlipidemia   . Anxiety   . Cholelithiasis   . Insomnia   . Mitral  regurgitation     noted 2010  . H/O epistaxis   . Rhinitis, allergic   . CHF (congestive heart failure) ([place])   . Pneumonia     "several times w/exacerbations of the COPD; nothing in the last year" (07/15/2012)  . COPD (chronic obstructive pulmonary disease) ([place])     as of 7/13 on 2-3L, pfts 10/2008 with mod obstruction  . Chronic bronchitis with COPD (chronic obstructive pulmonary disease) ([place])   . Shortness of breath     "all the time right now" (07/15/2012)  . GERD (gastroesophageal reflux disease)   . LZJQBHAL(937.9)     "weekly" (07/15/2012)  . Migraines     "weekly for awhile; cleared up as I got older" (07/15/2012)  . DJD (degenerative joint disease)   . Arthritis     "all over" (07/15/2012)  . OCD (obsessive compulsive disorder)   . OCD (obsessive compulsive disorder)   . Depression     h/o SI; "last time I was really serious about it was ~ 1997" (07/15/2012)  . Thoracic vertebral fracture ([place]) 11/03/2014  . Primary lung cancer [facility])     Past Surgical History  Procedure Laterality Date  . Tubal ligation  1972  . Cardioversion  2003; 07/2003  . Video bronchoscopy Bilateral 02/27/2015    Procedure: VIDEO BRONCHOSCOPY WITH FLUORO;  Surgeon: Chesley Mires, MD;  Location: [facility];  Service: Cardiopulmonary;  Laterality: Bilateral;    Social History:  reports that she quit smoking about 9 months ago. Her smoking use included Cigarettes. She has a 35 pack-year smoking history. She has never used smokeless tobacco. She reports that she does not drink alcohol or use illicit drugs.  Allergies  Allergen Reactions  . Ace Inhibitors Cough  . Codeine Swelling    Face and lips swelling  Patient report Can tolerate hydrocodone  . Pseudoeph-Doxylamine-Dm-Apap Itching  . Diphenhydramine Hcl Other (See Comments)    "feels like my skin is crawling, and legs twitches"  . Erythromycin Diarrhea  . Nsaids Nausea And Vomiting  . Nyquil [Pseudoeph-Doxylamine-Dm-Apap] Itching  . Tramadol Hcl  Other (See Comments)    stomach pain, hallucinations  . Fosamax [Alendronate] Other (See Comments)    Nausea and abdominal bloating.   . Tomato Other (See Comments)    Stomach swells  . Orange Fruit [Citrus] Rash  . Pseudoephedrine Palpitations    Family History  Problem Relation Age of Onset  . Asthma    . Emphysema    . Allergies    . Cancer      aunt had several types of cancer  . COPD Mother   . Emphysema Mother   . Cirrhosis Father     Prior to Admission medications   Medication Sig Start Date End Date Taking? Authorizing Provider  albuterol (PROVENTIL) (2.5 MG/3ML) 0.083% nebulizer solution Take 3 mLs (2.5 mg total) by nebulization every 6 (six) hours as needed for wheezing. 09/21/14  Yes Maryann Mikhail, DO  aspirin 325 MG EC tablet Take 650 mg by mouth daily with breakfast.    Yes Historical Provider, MD  busPIRone (BUSPAR) 10 MG tablet Take 10 mg by mouth 3 (three) times daily. 04/22/14  Yes  Historical Provider, MD  diazepam (VALIUM) 2 MG tablet Take 1 tablet (2 mg total) by mouth every 6 (six) hours as needed for anxiety. 03/05/15  Yes Donne Hazel, MD  diltiazem (CARDIZEM) 120 MG tablet Take 1 tablet (120 mg total) by mouth every 8 (eight) hours. 03/12/15  Yes Rigoberto Noel, MD  FLUoxetine (PROZAC) 20 MG capsule TAKE THREE CAPSULES BY MOUTH ONCE DAILY Patient taking differently: Take 60 mg by mouth daily.  01/03/14  Yes Tasrif Ahmed, MD  furosemide (LASIX) 20 MG tablet Take 40 mg by mouth daily.    Yes Historical Provider, MD  guaiFENesin (MUCINEX) 600 MG 12 hr tablet Take 600 mg by mouth 2 (two) times daily as needed for cough.   Yes Historical Provider, MD  HYDROcodone-acetaminophen (NORCO) 10-325 MG tablet Take 1 tablet by mouth every 6 (six) hours as needed for moderate pain (Pain or Dyspnea). 03/12/15  Yes Rigoberto Noel, MD  HYDROcodone-homatropine (HYCODAN) 5-1.5 MG/5ML syrup Take 2.5 mLs by mouth 2 (two) times daily as needed for cough. 03/05/15  Yes Donne Hazel, MD   ipratropium-albuterol (DUONEB) 0.5-2.5 (3) MG/3ML SOLN Take 3 mLs by nebulization 3 (three) times daily. 03/07/15  Yes Tammy S Parrett, NP  LORazepam (ATIVAN) 1 MG tablet Tale 1 tab 30 minutes prior to radiotherapy for anxiety. 03/15/15  Yes Eppie Gibson, MD  metoprolol succinate (TOPROL-XL) 25 MG 24 hr tablet Take 1 tablet (25 mg total) by mouth daily. 02/14/15  Yes Brittainy Erie Noe, PA-C  NON FORMULARY Place 1 drop into both eyes 2 (two) times daily as needed (hurting, dry). thera- tears   Yes Historical Provider, MD  pantoprazole (PROTONIX) 40 MG tablet TAKE ONE TABLET BY MOUTH TWICE DAILY 01/03/14  Yes Tasrif Ahmed, MD  SPIRIVA HANDIHALER 18 MCG inhalation capsule PLACE ONE CAPSULE INTO INHALER AND INHALE DAILY 03/07/15  Yes Rigoberto Noel, MD  SYMBICORT 160-4.5 MCG/ACT inhaler INHALE TWO PUFFS BY MOUTH TWICE DAILY 02/06/15  Yes Rigoberto Noel, MD  VENTOLIN HFA 108 (90 Base) MCG/ACT inhaler INHALE ONE TO TWO PUFFS BY MOUTH EVERY 6 HOURS AS NEEDED FOR WHEEZING 03/07/15  Yes Rigoberto Noel, MD  predniSONE (DELTASONE) 5 MG tablet Take 1 tablet (5 mg total) by mouth daily with breakfast. 03/06/15   Donne Hazel, MD  Wound Dressings (SONAFINE) Apply 1 application topically 3 (three) times daily as needed (for radiation).     Historical Provider, MD    Physical Exam: Filed Vitals:   03/30/15 1528 03/30/15 1530 03/30/15 1630 03/30/15 1700  BP: 100/88 114/69 125/65 107/70  Pulse: 126 125 138 126  Temp:      TempSrc:      Resp: '20 24 28 26  '$ SpO2: 100% 100% 98% 100%    Physical Exam  Constitutional: Appears well-developed and well-nourished. No distress.  HENT: Normocephalic. External right and left ear normal. Oropharynx is clear and moist.  Eyes: Conjunctivae and EOM are normal. PERRLA, no scleral icterus.  Neck: Normal ROM. Neck supple. No JVD. No tracheal deviation. No thyromegaly.  CVS: IRRR, S1/S2 +, no gallops, no carotid bruit.  Pulmonary: Effort and breath sounds normal, no stridor,  rhonchi, wheezes, rales.  Abdominal: Soft. BS +,  no distension, tenderness, rebound or guarding.  Musculoskeletal: Normal range of motion. No edema and no tenderness.  Lymphadenopathy: No lymphadenopathy noted, cervical, inguinal. Neuro: Alert. Normal reflexes, muscle tone coordination. No cranial nerve deficit. Skin: Skin is warm and dry. No rash noted. Not diaphoretic. No  erythema. No pallor.  Psychiatric: Normal mood and affect. Behavior, judgment, thought content normal.   Labs on Admission:  Basic Metabolic Panel:  Recent Labs Lab 03/27/15 1003 03/28/15 1210 03/30/15 1450  NA 139 140 143  K 5.3* 4.7 4.5  CL 101 101 101  CO2 31 33* 34*  GLUCOSE 128* 112* 114*  BUN '12 14 7  '$ CREATININE 0.64 0.54 0.51  CALCIUM 8.7* 8.4* 9.0   Liver Function Tests:  Recent Labs Lab 03/27/15 1003  AST 11*  ALT 12*  ALKPHOS 103  BILITOT 1.0  PROT 5.7*  ALBUMIN 3.2*    Recent Labs Lab 03/27/15 1003  LIPASE 19   CBC:  Recent Labs Lab 03/27/15 1003 03/28/15 1210 03/30/15 1450  WBC 4.5 3.8* 3.9*  NEUTROABS 2.8  --  2.6  HGB 9.8* 10.0* 10.9*  HCT 33.3* 34.3* 36.2  MCV 99.1 99.4 98.1  PLT 308 283 275    EKG: a-fib with RVR   If 7PM-7AM, please contact night-coverage [email or web address] Password [facility] 03/30/2015, 5:13 PM

## 2015-03-30 NOTE — ED Provider Notes (Signed)
CSN: [id number]     Arrival date & time 03/30/15  1352 History   First MD Initiated Contact with Patient 03/30/15 1418     Chief Complaint  Patient presents with  . Shortness of Breath     (Consider location/radiation/quality/duration/timing/severity/associated sxs/prior Treatment) HPI   Kelsey Garcia is a 70 y.o. female who presents for evaluation of fatigue and shortness of breath, gradual onset over the last 3 weeks, and worsening despite treatment. She was started on radiation treatment, for lung cancer, 6 days ago, was 2 weeks ago. The last 2 days. Is evaluated in the emergency department on 03/27/2015 and 03/28/2015. She was treated symptomatically, for fever and shortness of breath on those visits. She uses oxygen at home, 2 L/m, and intermittent bronchodilators and chronic treatment for asthma. She has a cough that is occasionally productive of yellow sputum. She denies chest pain. He has had decreased appetite for one week, mild nausea but no persistent vomiting and no diarrhea. She's taking her usual medications, without relief. There are no other known modifying factors.   Past Medical History  Diagnosis Date  . Atrial fibrillation ([place])   . Hypertension   . Insomnia   . Nonischemic cardiomyopathy ([place])   . Hyperlipidemia   . Anxiety   . Cholelithiasis   . Insomnia   . Mitral regurgitation     noted 2010  . H/O epistaxis   . Rhinitis, allergic   . CHF (congestive heart failure) ([place])   . Pneumonia     "several times w/exacerbations of the COPD; nothing in the last year" (07/15/2012)  . COPD (chronic obstructive pulmonary disease) ([place])     as of 7/13 on 2-3L, pfts 10/2008 with mod obstruction  . Chronic bronchitis with COPD (chronic obstructive pulmonary disease) ([place])   . Shortness of breath     "all the time right now" (07/15/2012)  . GERD (gastroesophageal reflux disease)   . SNKNLZJQ(734.1)     "weekly" (07/15/2012)  . Migraines     "weekly for awhile; cleared up as I  got older" (07/15/2012)  . DJD (degenerative joint disease)   . Arthritis     "all over" (07/15/2012)  . OCD (obsessive compulsive disorder)   . OCD (obsessive compulsive disorder)   . Depression     h/o SI; "last time I was really serious about it was ~ 1997" (07/15/2012)  . Thoracic vertebral fracture ([place]) 11/03/2014  . Primary lung cancer [facility])    Past Surgical History  Procedure Laterality Date  . Tubal ligation  1972  . Cardioversion  2003; 07/2003  . Video bronchoscopy Bilateral 02/27/2015    Procedure: VIDEO BRONCHOSCOPY WITH FLUORO;  Surgeon: Chesley Mires, MD;  Location: [facility];  Service: Cardiopulmonary;  Laterality: Bilateral;   Family History  Problem Relation Age of Onset  . Asthma    . Emphysema    . Allergies    . Cancer      aunt had several types of cancer  . COPD Mother   . Emphysema Mother   . Cirrhosis Father    Social History  Substance Use Topics  . Smoking status: Former Smoker -- 1.00 packs/day for 35 years    Types: Cigarettes    Quit date: 06/18/2014  . Smokeless tobacco: Never Used  . Alcohol Use: No   OB History    No data available     Review of Systems  All other systems reviewed and are negative.     Allergies  Ace inhibitors; Codeine; Pseudoeph-doxylamine-dm-apap; Diphenhydramine hcl; Erythromycin; Nsaids; Nyquil; Tramadol hcl; Fosamax; Tomato; Orange fruit; and Pseudoephedrine  Home Medications   Prior to Admission medications   Medication Sig Start Date End Date Taking? Authorizing Provider  albuterol (PROVENTIL) (2.5 MG/3ML) 0.083% nebulizer solution Take 3 mLs (2.5 mg total) by nebulization every 6 (six) hours as needed for wheezing. 09/21/14  Yes Maryann Mikhail, DO  aspirin 325 MG EC tablet Take 650 mg by mouth daily with breakfast.    Yes Historical Provider, MD  busPIRone (BUSPAR) 10 MG tablet Take 10 mg by mouth 3 (three) times daily. 04/22/14  Yes Historical Provider, MD  diazepam (VALIUM) 2 MG tablet Take 1 tablet (2 mg  total) by mouth every 6 (six) hours as needed for anxiety. 03/05/15  Yes Donne Hazel, MD  diltiazem (CARDIZEM) 120 MG tablet Take 1 tablet (120 mg total) by mouth every 8 (eight) hours. 03/12/15  Yes Rigoberto Noel, MD  FLUoxetine (PROZAC) 20 MG capsule TAKE THREE CAPSULES BY MOUTH ONCE DAILY Patient taking differently: Take 60 mg by mouth daily.  01/03/14  Yes Tasrif Ahmed, MD  furosemide (LASIX) 20 MG tablet Take 40 mg by mouth daily.    Yes Historical Provider, MD  guaiFENesin (MUCINEX) 600 MG 12 hr tablet Take 600 mg by mouth 2 (two) times daily as needed for cough.   Yes Historical Provider, MD  HYDROcodone-acetaminophen (NORCO) 10-325 MG tablet Take 1 tablet by mouth every 6 (six) hours as needed for moderate pain (Pain or Dyspnea). 03/12/15  Yes Rigoberto Noel, MD  HYDROcodone-homatropine (HYCODAN) 5-1.5 MG/5ML syrup Take 2.5 mLs by mouth 2 (two) times daily as needed for cough. 03/05/15  Yes Donne Hazel, MD  ipratropium-albuterol (DUONEB) 0.5-2.5 (3) MG/3ML SOLN Take 3 mLs by nebulization 3 (three) times daily. 03/07/15  Yes Tammy S Parrett, NP  LORazepam (ATIVAN) 1 MG tablet Tale 1 tab 30 minutes prior to radiotherapy for anxiety. 03/15/15  Yes Eppie Gibson, MD  metoprolol succinate (TOPROL-XL) 25 MG 24 hr tablet Take 1 tablet (25 mg total) by mouth daily. 02/14/15  Yes Brittainy Erie Noe, PA-C  NON FORMULARY Place 1 drop into both eyes 2 (two) times daily as needed (hurting, dry). thera- tears   Yes Historical Provider, MD  pantoprazole (PROTONIX) 40 MG tablet TAKE ONE TABLET BY MOUTH TWICE DAILY 01/03/14  Yes Tasrif Ahmed, MD  SPIRIVA HANDIHALER 18 MCG inhalation capsule PLACE ONE CAPSULE INTO INHALER AND INHALE DAILY 03/07/15  Yes Rigoberto Noel, MD  SYMBICORT 160-4.5 MCG/ACT inhaler INHALE TWO PUFFS BY MOUTH TWICE DAILY 02/06/15  Yes Rigoberto Noel, MD  VENTOLIN HFA 108 (90 Base) MCG/ACT inhaler INHALE ONE TO TWO PUFFS BY MOUTH EVERY 6 HOURS AS NEEDED FOR WHEEZING 03/07/15  Yes Rigoberto Noel, MD   predniSONE (DELTASONE) 5 MG tablet Take 1 tablet (5 mg total) by mouth daily with breakfast. 03/06/15   Donne Hazel, MD  Wound Dressings (SONAFINE) Apply 1 application topically 3 (three) times daily as needed (for radiation).     Historical Provider, MD   BP 114/69 mmHg  Pulse 125  Temp(Src) 98 F (36.7 C) (Oral)  Resp 24  SpO2 100% Physical Exam  Constitutional: She is oriented to person, place, and time. She appears well-developed. She appears distressed (mildly anxious.).  Appears older than stated age.  HENT:  Head: Normocephalic and atraumatic.  Right Ear: External ear normal.  Left Ear: External ear normal.  Eyes: Conjunctivae and EOM are normal.  Pupils are equal, round, and reactive to light.  Neck: Normal range of motion and phonation normal. Neck supple.  Cardiovascular: Normal rate, regular rhythm and normal heart sounds.   Pulmonary/Chest: Effort normal. No respiratory distress. She exhibits no bony tenderness.  Somewhat diminished air movement bilaterally with few scattered wheezes, no rhonchi or rales.  Abdominal: Soft. There is no tenderness.  Musculoskeletal: Normal range of motion. She exhibits no edema or tenderness.  Neurological: She is alert and oriented to person, place, and time. No cranial nerve deficit or sensory deficit. She exhibits normal muscle tone. Coordination normal.  Skin: Skin is warm, dry and intact.  Psychiatric: She has a normal mood and affect. Her behavior is normal. Judgment and thought content normal.  Nursing note and vitals reviewed.   ED Course  Procedures (including critical care time)  Medications  0.9 %  sodium chloride infusion (not administered)  sodium chloride 0.9 % bolus 500 mL (not administered)  metoprolol (LOPRESSOR) injection 5 mg (not administered)  sodium chloride 0.9 % bolus 1,000 mL (1,000 mLs Intravenous New Bag/Given 03/30/15 1602)    Patient Vitals for the past 24 hrs:  BP Temp Temp src Pulse Resp SpO2   03/30/15 1530 114/69 mmHg - - (!) 125 24 100 %  03/30/15 1528 100/88 mmHg - - (!) 126 20 100 %  03/30/15 1500 100/88 mmHg - - (!) 133 21 98 %  03/30/15 1444 (!) 113/49 mmHg - - 117 23 96 %  03/30/15 1402 104/72 mmHg 98 F (36.7 C) Oral (!) 125 23 99 %  03/30/15 1359 - - - - - 90 %    4:28 PM Reevaluation with update and discussion. After initial assessment and treatment, an updated evaluation reveals attempt to ambulate, failed, patient was "too weak to stand" .  HR now 140. Lopressor ordered. Linzie Criss L    4:33 PM-Consult complete with Hospitalist. Patient case explained and discussed. She agrees to admit patient for further evaluation and treatment. Call ended at [place] Performed by: Richarda Blade Total critical care time: 45 minutes Critical care time was exclusive of separately billable procedures and treating other patients. Critical care was necessary to treat or prevent imminent or life-threatening deterioration. Critical care was time spent personally by me on the following activities: development of treatment plan with patient and/or surrogate as well as nursing, discussions with consultants, evaluation of patient's response to treatment, examination of patient, obtaining history from patient or surrogate, ordering and performing treatments and interventions, ordering and review of laboratory studies, ordering and review of radiographic studies, pulse oximetry and re-evaluation of patient's condition.   Labs Review Labs Reviewed  BASIC METABOLIC PANEL - Abnormal; Notable for the following:    CO2 34 (*)    Glucose, Bld 114 (*)    All other components within normal limits  CBC WITH DIFFERENTIAL/PLATELET - Abnormal; Notable for the following:    WBC 3.9 (*)    RBC 3.69 (*)    Hemoglobin 10.9 (*)    RDW 16.0 (*)    All other components within normal limits  CULTURE, EXPECTORATED SPUTUM-ASSESSMENT  CULTURE, RESPIRATORY (NON-EXPECTORATED)    Imaging  Review Dg Chest 2 View  03/30/2015  CLINICAL DATA:  History of lung cancer and COPD. Shortness of breath. EXAM: CHEST  2 VIEW COMPARISON:  03/27/2015 and 02/27/2015 FINDINGS: Lungs are adequately inflated with mild interval improvement of hazy lingular airspace density. No evidence of effusion or pneumothorax. Mild stable cardiomegaly. Calcified plaque over  the thoracic aorta. Stable known mild compression of several mid to upper thoracic vertebral bodies. IMPRESSION: Slight interval improvement in hazy lingular airspace density. Stable known thoracic spine compression fractures. Stable cardiomegaly. Electronically Signed   By: Marin Olp M.D.   On: 03/30/2015 14:52   I have personally reviewed and evaluated these images and lab results as part of my medical decision-making.   EKG Interpretation   Date/Time:  Saturday March 30 2015 14:19:04 EST Ventricular Rate:  132 PR Interval:    QRS Duration: 87 QT Interval:  304 QTC Calculation: 450 R Axis:   -148 Text Interpretation:  Atrial fibrillation Right axis deviation Low  voltage, precordial leads Probable anteroseptal infarct, old Borderline ST  depression, diffuse leads Since last tracing rate faster Confirmed by  [facility]  MD, Valeda Corzine ([id number]) on 03/30/2015 4:27:43 PM      MDM   Final diagnoses:  Atrial fibrillation with RVR (HCC)  Chronic fatigue  Shortness of breath     Nonspecific fatigue, now with A. fib RVR, unstable hemodynamic situation. Patient likely has multifactorial compromise related to COPD, volume depletion, acute illness, lung cancer and deconditioning. She will require admission for further management.  Nursing Notes Reviewed/ Care Coordinated, and agree without changes. Applicable Imaging Reviewed.  Interpretation of Laboratory Data incorporated into ED treatment  Plan: Admit    Daleen Bo, MD 03/30/15 [phone]

## 2015-03-30 NOTE — ED Notes (Signed)
Delay in blood draw, pt not in room

## 2015-03-31 DIAGNOSIS — I482 Chronic atrial fibrillation: Secondary | ICD-10-CM

## 2015-03-31 LAB — BASIC METABOLIC PANEL
Anion gap: 9 (ref 5–15)
BUN: 11 mg/dL (ref 6–20)
CALCIUM: 8.9 mg/dL (ref 8.9–10.3)
CO2: 32 mmol/L (ref 22–32)
CREATININE: 0.63 mg/dL (ref 0.44–1.00)
Chloride: 102 mmol/L (ref 101–111)
Glucose, Bld: 232 mg/dL — ABNORMAL HIGH (ref 65–99)
Potassium: 4.6 mmol/L (ref 3.5–5.1)
SODIUM: 143 mmol/L (ref 135–145)

## 2015-03-31 LAB — CBC
HEMATOCRIT: 34.8 % — AB (ref 36.0–46.0)
HEMOGLOBIN: 10.4 g/dL — AB (ref 12.0–15.0)
MCH: 29.3 pg (ref 26.0–34.0)
MCHC: 29.9 g/dL — AB (ref 30.0–36.0)
MCV: 98 fL (ref 78.0–100.0)
Platelets: 308 10*3/uL (ref 150–400)
RBC: 3.55 MIL/uL — ABNORMAL LOW (ref 3.87–5.11)
RDW: 16.1 % — AB (ref 11.5–15.5)
WBC: 2.5 10*3/uL — ABNORMAL LOW (ref 4.0–10.5)

## 2015-03-31 LAB — TROPONIN I: Troponin I: <0.03 ng/mL (ref <0.031)

## 2015-03-31 MED ORDER — HYDROCODONE-HOMATROPINE 5-1.5 MG/5ML PO SYRP
2.5000 mL | ORAL_SOLUTION | Freq: Once | ORAL | Status: AC
  Administered 2015-03-31: 2.5 mL via ORAL
  Filled 2015-03-31: qty 5

## 2015-03-31 MED ORDER — LEVALBUTEROL HCL 1.25 MG/0.5ML IN NEBU
1.2500 mg | INHALATION_SOLUTION | Freq: Three times a day (TID) | RESPIRATORY_TRACT | Status: DC
  Administered 2015-03-31 – 2015-04-01 (×2): 1.25 mg via RESPIRATORY_TRACT
  Filled 2015-03-31 (×5): qty 0.5

## 2015-03-31 NOTE — Progress Notes (Signed)
Patient ID: Kelsey Garcia, female   DOB: 08/01/1945, 70 y.o.   MRN: [id number]  [facility] PROGRESS NOTE  Kelsey Garcia RKY:[id number] DOB: 02/24/1945 DOA: 03/30/2015 PCP: Darden Amber, PA   Brief narrative:    70 y.o. female with history of lung cancer, who presents for evaluation of fatigue and shortness of breath, several weeks in duration, associated with some mid chest area pressure, no specific alleviating or aggravating factors, no fevers, no chills, no abd or urinary concerns. The last 2 days. Is evaluated in the emergency department on 03/27/2015 and 03/28/2015 for this and was sent home but has not gotten much better.  In ED, pt noted to be hemodynamically stable but in a-fib with RVR, HR in 120's. [facility] asked to admit for further evaluation. Telemetry unit requested in case Cardizem drip needed.   Assessment/Plan:     Active Problems:  Atrial fibrillation with RVR (HCC), chronic, CHADS 2 score 4 - HR is more stable at this time but if continues trending up, place on cardizem drip - continue home regimen of metoprolol and Cardizem  - has refused AC in the past and still refusing    Chronic respiratory failure ([place]) due to COPD  - on oxygen at home at all times 2-3 L [place] - stable at this time    Essential hypertension - Cont cardizem, metoprolol   Depression and anxiety  - Thus far stable, no suicidal or homicidal ideations. - Continued home medications   Chronic diastolic (congestive) heart failure ([place])  - 2-D echo on 03/07/14 showed EF 50-60%. Patient does not have leg edema, CHF compensated - weight stable 78 kg   Squamous Cell Lung CA - Patient was seen by Radiation Oncology with recs for outpatient PET and follow up RT   Leukopenia - mild, monitor   Anemia of chronic disease, IDA - no signs of bleeding   Lovenox SQ for DVT prophylaxis   Code Status: Full.  Family Communication:  plan of care discussed with the patient Disposition Plan: Home  by 2/22  IV access:  Peripheral IV  Procedures and diagnostic studies:    Dg Chest 2 View  03/30/2015  CLINICAL DATA:  History of lung cancer and COPD. Shortness of breath. EXAM: CHEST  2 VIEW COMPARISON:  03/27/2015 and 02/27/2015 FINDINGS: Lungs are adequately inflated with mild interval improvement of hazy lingular airspace density. No evidence of effusion or pneumothorax. Mild stable cardiomegaly. Calcified plaque over the thoracic aorta. Stable known mild compression of several mid to upper thoracic vertebral bodies. IMPRESSION: Slight interval improvement in hazy lingular airspace density. Stable known thoracic spine compression fractures. Stable cardiomegaly. Electronically Signed   By: Marin Olp M.D.   On: 03/30/2015 14:52   Ct Angio Chest Pe W/cm &/or Wo Cm  03/30/2015  CLINICAL DATA:  Fatigue and shortness of breath. The patient started radiation therapy for lung cancer 6 days ago. EXAM: CT ANGIOGRAPHY CHEST WITH CONTRAST TECHNIQUE: Multidetector CT imaging of the chest was performed using the standard protocol during bolus administration of intravenous contrast. Multiplanar CT image reconstructions and MIPs were obtained to evaluate the vascular anatomy. CONTRAST:  159m OMNIPAQUE IOHEXOL 350 MG/ML SOLN COMPARISON:  Chest radiographs obtained earlier today. PET-CT dated 03/13/2015. Chest CT dated 02/25/2015. FINDINGS: Mediastinum/Lymph Nodes: Normally opacified pulmonary arteries with no pulmonary arterial filling defects seen. A mildly prominent precarinal lymph node has not changed significantly, with a short axis diameter of 12 mm on image number 26. This did not have  increased activity on the recent PET-CT. There is also mild concentric soft tissue thickening around the proximal left upper lobe bronchus in the left hilum, measuring 1.3 x 1.2 cm on image number 28. This is causing moderate to marked concentric bronchial narrowing. There are similar changes causing moderate concentric  narrowing of the proximal lingular bronchus. Lungs/Pleura: Mild further decrease in lingular atelectasis. No lung nodules. No pleural fluid. Upper abdomen: No acute findings. Musculoskeletal: Stable old T5-T6 vertebral compression deformities. Less prominent associated sclerosis. Review of the MIP images confirms the above findings. IMPRESSION: 1. No pulmonary emboli or acute abnormality. 2. Soft tissue thickening in the left hilum, causing moderate to marked concentric luminal narrowing involving the proximal left upper lobe bronchus and moderate concentric luminal narrowing involving the proximal lingular bronchus. This may represent the patient's recently diagnosed malignancy. 3. Mild further improvement in lingular atelectasis. 4. Stable mildly prominent precarinal lymph no without hypermetabolism. Electronically Signed   By: Claudie Revering M.D.   On: 03/30/2015 18:33     Medical Consultants:  None   Other Consultants:  None  IAnti-Infectives:   None  Faye Ramsay, MD  [facility] Pager [phone]  If 7PM-7AM, please contact night-coverage [email or web address] Password [email or web address] 03/31/2015, 6:10 PM   LOS: 1 day   HPI/Subjective: No events overnight.   Objective: Filed Vitals:   03/31/15 0536 03/31/15 0946 03/31/15 1258 03/31/15 1414  BP: 136/73   111/69  Pulse: 107  98 101  Temp: 98.3 F (36.8 C)   98 F (36.7 C)  TempSrc: Oral   Oral  Resp: '20  20 20  '$ Height:      Weight:  78.2 kg (172 lb 6.4 oz)    SpO2: 97%  94% 95%    Intake/Output Summary (Last 24 hours) at 03/31/15 1810 Last data filed at 03/31/15 1800  Gross per 24 hour  Intake 3205.83 ml  Output      0 ml  Net 3205.83 ml    Exam:   General:  Pt is alert, follows commands appropriately, not in acute distress  Cardiovascular: Irregular rate and rhythm,  no rubs, no gallops  Respiratory: Clear to auscultation bilaterally, no wheezing, diminished breath sounds at bases   Abdomen: Soft, non tender, non distended, bowel  sounds present, no guarding  Extremities: No edema, pulses DP and PT palpable bilaterally   Data Reviewed: Basic Metabolic Panel:  Recent Labs Lab 03/27/15 1003 03/28/15 1210 03/30/15 1450 03/30/15 2004 03/31/15 0122  NA 139 140 143  --  143  K 5.3* 4.7 4.5  --  4.6  CL 101 101 101  --  102  CO2 31 33* 34*  --  32  GLUCOSE 128* 112* 114*  --  232*  BUN '12 14 7  '$ --  11  CREATININE 0.64 0.54 0.51 0.69 0.63  CALCIUM 8.7* 8.4* 9.0  --  8.9   Liver Function Tests:  Recent Labs Lab 03/27/15 1003  AST 11*  ALT 12*  ALKPHOS 103  BILITOT 1.0  PROT 5.7*  ALBUMIN 3.2*    Recent Labs Lab 03/27/15 1003  LIPASE 19   No results for input(s): AMMONIA in the last 168 hours. CBC:  Recent Labs Lab 03/27/15 1003 03/28/15 1210 03/30/15 1450 03/30/15 2004 03/31/15 0122  WBC 4.5 3.8* 3.9* 2.8* 2.5*  NEUTROABS 2.8  --  2.6  --   --   HGB 9.8* 10.0* 10.9* 9.6* 10.4*  HCT 33.3* 34.3* 36.2 31.5* 34.8*  MCV 99.1 99.4 98.1 96.9  98.0  PLT 308 283 275 261 308   Cardiac Enzymes:  Recent Labs Lab 03/30/15 2004 03/31/15 0125 03/31/15 0538  TROPONINI <0.03 <0.03 <0.03   BNP: Invalid input(s): POCBNP CBG: No results for input(s): GLUCAP in the last 168 hours.  Recent Results (from the past 240 hour(s))  Culture, expectorated sputum-assessment     Status: None   Collection Time: 03/30/15  3:20 PM  Result Value Ref Range Status   Specimen Description SPUTUM  Final   Special Requests Normal  Final   Sputum evaluation THIS SPECIMEN IS ACCEPTABLE FOR SPUTUM CULTURE  Final   Report Status 03/30/2015 FINAL  Final     Scheduled Meds: . aspirin  650 mg Oral Q breakfast  . busPIRone  10 mg Oral TID  . diltiazem  120 mg Oral 3 times per day  . enoxaparin (LOVENOX) injection  40 mg Subcutaneous Q24H  . FLUoxetine  60 mg Oral Daily  . furosemide  40 mg Oral Daily  . levalbuterol  1.25 mg Nebulization TID  . metoprolol succinate  25 mg Oral Daily  . mometasone-formoterol  2  puff Inhalation BID  . pantoprazole  40 mg Oral Daily  . tiotropium  18 mcg Inhalation Daily   Continuous Infusions: . sodium chloride 125 mL/hr at 03/31/15 1758

## 2015-04-01 ENCOUNTER — Ambulatory Visit
Admission: RE | Admit: 2015-04-01 | Discharge: 2015-04-01 | Disposition: A | Discharge status: Home / Self Care | Payer: Medicare Other | Source: Ambulatory Visit | Attending: Radiation Oncology | Admitting: Radiation Oncology

## 2015-04-01 LAB — CBC
HCT: 32.3 % — ABNORMAL LOW (ref 36.0–46.0)
Hemoglobin: 9.5 g/dL — ABNORMAL LOW (ref 12.0–15.0)
MCH: 29.9 pg (ref 26.0–34.0)
MCHC: 29.4 g/dL — AB (ref 30.0–36.0)
MCV: 101.6 fL — ABNORMAL HIGH (ref 78.0–100.0)
PLATELETS: 273 10*3/uL (ref 150–400)
RBC: 3.18 MIL/uL — ABNORMAL LOW (ref 3.87–5.11)
RDW: 17.2 % — ABNORMAL HIGH (ref 11.5–15.5)
WBC: 5.5 10*3/uL (ref 4.0–10.5)

## 2015-04-01 LAB — BASIC METABOLIC PANEL
Anion gap: 4 — ABNORMAL LOW (ref 5–15)
BUN: 22 mg/dL — AB (ref 6–20)
CO2: 33 mmol/L — ABNORMAL HIGH (ref 22–32)
CREATININE: 0.7 mg/dL (ref 0.44–1.00)
Calcium: 9 mg/dL (ref 8.9–10.3)
Chloride: 108 mmol/L (ref 101–111)
GFR calc Af Amer: >60 mL/min (ref >60)
Glucose, Bld: 110 mg/dL — ABNORMAL HIGH (ref 65–99)
Potassium: 4.2 mmol/L (ref 3.5–5.1)
SODIUM: 145 mmol/L (ref 135–145)

## 2015-04-01 MED ORDER — LEVALBUTEROL HCL 0.63 MG/3ML IN NEBU
0.6300 mg | INHALATION_SOLUTION | Freq: Three times a day (TID) | RESPIRATORY_TRACT | Status: DC
  Administered 2015-04-02: 0.63 mg via RESPIRATORY_TRACT
  Filled 2015-04-01 (×3): qty 3

## 2015-04-01 NOTE — Progress Notes (Signed)
Patient ID: Kelsey Garcia, female   DOB: 1945/04/21, 70 y.o.   MRN: [id number]  [facility] PROGRESS NOTE  Kelsey Garcia [id number]:[id number] DOB: [birth year] DOA: 03/30/2015 PCP: Darden Amber, PA   Brief narrative:    70 y.o. female with history of lung cancer, who presents for evaluation of fatigue and shortness of breath, several weeks in duration, associated with some mid chest area pressure, no specific alleviating or aggravating factors, no fevers, no chills, no abd or urinary concerns. The last 2 days. Is evaluated in the emergency department on 03/27/2015 and 03/28/2015 for this and was sent home but has not gotten much better.  In ED, pt noted to be hemodynamically stable but in a-fib with RVR, HR in 120's. [facility] asked to admit for further evaluation. Telemetry unit requested in case Cardizem drip needed.   Assessment/Plan:    Active Problems:  Atrial fibrillation with RVR (HCC), chronic, CHADS 2 score 4 - HR is more stable at this time, rate control  - continue home regimen of metoprolol and Cardizem  - has refused AC in the past and still refusing    Chronic respiratory failure ([place]) due to COPD  - on oxygen at home at all times 2-3 L [place] - stable at this time    Essential hypertension - Cont cardizem, metoprolol   Depression and anxiety  - Thus far stable, no suicidal or homicidal ideations. - Continued home medications   Chronic diastolic (congestive) heart failure ([place])  - 2-D echo on 03/07/14 showed EF 50-60%. Patient does not have leg edema, CHF compensated - weight stable 78 - 79 kg   Squamous Cell Lung CA - Patient was seen by Radiation Oncology with recs for outpatient PET and follow up RT - radiation therapy scheduled for 3 pm today    Leukopenia - mild, resolved    Anemia of chronic disease, IDA - no signs of bleeding  - CBC in AM  Lovenox SQ for DVT prophylaxis   Code Status: Full.  Family Communication:  plan of care discussed with the  patient Disposition Plan: Home by 2/22  IV access:  Peripheral IV  Procedures and diagnostic studies:    Dg Chest 2 View 03/30/2015   Slight interval improvement in hazy lingular airspace density. Stable known thoracic spine compression fractures. Stable cardiomegaly.   Ct Angio Chest Pe W/cm &/or Wo Cm 03/30/2015  No pulmonary emboli or acute abnormality. 2. Soft tissue thickening in the left hilum, causing moderate to marked concentric luminal narrowing involving the proximal left upper lobe bronchus and moderate concentric luminal narrowing involving the proximal lingular bronchus. This may represent the patient's recently diagnosed malignancy. 3. Mild further improvement in lingular atelectasis. 4. Stable mildly prominent precarinal lymph no without hypermetabolism.   Medical Consultants:  None   Other Consultants:  None  IAnti-Infectives:   None  Faye Ramsay, MD  [facility] Pager [phone]  If 7PM-7AM, please contact night-coverage [email or web address] Password [email or web address] 04/01/2015, 10:09 AM   LOS: 2 days   HPI/Subjective: No events overnight.   Objective: Filed Vitals:   03/31/15 2055 03/31/15 2154 04/01/15 0458 04/01/15 0724  BP: 128/65  109/66   Pulse: 87  69   Temp: 98.2 F (36.8 C)  97.8 F (36.6 C)   TempSrc: Oral  Oral   Resp: 20  20   Height:      Weight:   79.2 kg (174 lb 9.7 oz)   SpO2: 100% 96% 96% 95%    Intake/Output Summary (  Last 24 hours) at 04/01/15 1009 Last data filed at 03/31/15 1800  Gross per 24 hour  Intake 3205.83 ml  Output      0 ml  Net 3205.83 ml    Exam:   General:  Pt is alert, follows commands appropriately, not in acute distress  Cardiovascular: Irregular rate and rhythm,  no rubs, no gallops  Respiratory: Clear to auscultation bilaterally, no wheezing, diminished breath sounds at bases   Abdomen: Soft, non tender, non distended, bowel sounds present, no guarding  Extremities: No edema, pulses DP and PT palpable  bilaterally   Data Reviewed: Basic Metabolic Panel:  Recent Labs Lab 03/27/15 1003 03/28/15 1210 03/30/15 1450 03/30/15 2004 03/31/15 0122 04/01/15 0503  NA 139 140 143  --  143 145  K 5.3* 4.7 4.5  --  4.6 4.2  CL 101 101 101  --  102 108  CO2 31 33* 34*  --  32 33*  GLUCOSE 128* 112* 114*  --  232* 110*  BUN '12 14 7  '$ --  11 22*  CREATININE 0.64 0.54 0.51 0.69 0.63 0.70  CALCIUM 8.7* 8.4* 9.0  --  8.9 9.0   Liver Function Tests:  Recent Labs Lab 03/27/15 1003  AST 11*  ALT 12*  ALKPHOS 103  BILITOT 1.0  PROT 5.7*  ALBUMIN 3.2*    Recent Labs Lab 03/27/15 1003  LIPASE 19   No results for input(s): AMMONIA in the last 168 hours. CBC:  Recent Labs Lab 03/27/15 1003 03/28/15 1210 03/30/15 1450 03/30/15 2004 03/31/15 0122 04/01/15 0503  WBC 4.5 3.8* 3.9* 2.8* 2.5* 5.5  NEUTROABS 2.8  --  2.6  --   --   --   HGB 9.8* 10.0* 10.9* 9.6* 10.4* 9.5*  HCT 33.3* 34.3* 36.2 31.5* 34.8* 32.3*  MCV 99.1 99.4 98.1 96.9 98.0 101.6*  PLT 308 283 275 261 308 273   Cardiac Enzymes:  Recent Labs Lab 03/30/15 2004 03/31/15 0125 03/31/15 0538  TROPONINI <0.03 <0.03 <0.03   BNP: Invalid input(s): POCBNP CBG: No results for input(s): GLUCAP in the last 168 hours.  Recent Results (from the past 240 hour(s))  Culture, expectorated sputum-assessment     Status: None   Collection Time: 03/30/15  3:20 PM  Result Value Ref Range Status   Specimen Description SPUTUM  Final   Special Requests Normal  Final   Sputum evaluation THIS SPECIMEN IS ACCEPTABLE FOR SPUTUM CULTURE  Final   Report Status 03/30/2015 FINAL  Final     Scheduled Meds: . aspirin  650 mg Oral Q breakfast  . busPIRone  10 mg Oral TID  . diltiazem  120 mg Oral 3 times per day  . enoxaparin (LOVENOX) injection  40 mg Subcutaneous Q24H  . FLUoxetine  60 mg Oral Daily  . furosemide  40 mg Oral Daily  . levalbuterol  1.25 mg Nebulization TID  . metoprolol succinate  25 mg Oral Daily  .  mometasone-formoterol  2 puff Inhalation BID  . pantoprazole  40 mg Oral Daily  . tiotropium  18 mcg Inhalation Daily   Continuous Infusions:

## 2015-04-01 NOTE — Progress Notes (Signed)
[facility] Therapy Treatment Record Phone [phone]   Radiation Therapy was administered to [name] on: 04/01/2015  2:51 PM and was treatment # 4 out of a planned course of 10 treatments.

## 2015-04-01 NOTE — Evaluation (Signed)
Physical Therapy Evaluation Patient Details Name: Kelsey Garcia MRN: [id number] DOB: 1945-08-14 Today's Date: 04/01/2015   History of Present Illness  70 yo female admitted with A fib with RVR. Hx of lung cancer, COPD-O2 dep, HTN, cardiomyopathy, CHF, OCD, arthritis, T spine comp fx  Clinical Impression  On eval, pt required Min assist for mobility-walked ~15'x2 with RW (to and from bathroom). Pt was unsteady. Pt stated she hadn't fully awakened yet. Pt tolerated short distance fairly well. HR 71 bpm at end of session. Discussed d/c plan-pt states her daughter is home with her all the time. Recocmmend HHPT follow up if pt is agreeable.    Follow Up Recommendations Home health PT;Supervision/Assistance - 24 hour    Equipment Recommendations  None recommended by PT    Recommendations for Other Services       Precautions / Restrictions Precautions Precautions: Fall Precaution Comments: O2 dep Restrictions Weight Bearing Restrictions: No      Mobility  Bed Mobility Overal bed mobility: Needs Assistance Bed Mobility: Supine to Sit     Supine to sit: Supervision        Transfers Overall transfer level: Needs assistance Equipment used: Rolling walker (2 wheeled) Transfers: Sit to/from Stand Sit to Stand: Min assist         General transfer comment: assist to steady. increased time to rise.  Ambulation/Gait Ambulation/Gait assistance: Min assist Ambulation Distance (Feet): 15 Feet (x2) Assistive device: Rolling walker (2 wheeled) Gait Pattern/deviations: Step-through pattern;Trunk flexed     General Gait Details: assist to stabilize pt and maneuver with walker. slow gait speed. remained on 2L O2.   Stairs            Wheelchair Mobility    Modified Rankin (Stroke Patients Only)       Balance Overall balance assessment: Needs assistance         Standing balance support: Bilateral upper extremity supported;During functional activity Standing  balance-Leahy Scale: Poor Standing balance comment: requires use of walker                             Pertinent Vitals/Pain Pain Assessment: No/denies pain    Home Living Family/patient expects to be discharged to:: Private residence Living Arrangements: Children Available Help at Discharge: Available PRN/intermittently Type of Home: House Home Access: Stairs to enter Entrance Stairs-Rails: Left   Home Layout: Two level;Bed/bath upstairs;1/2 bath on main level Home Equipment: Walker - 2 wheels;Wheelchair - power;Shower seat;Grab bars - tub/shower;Bedside commode;Wheelchair - manual      Prior Function Level of Independence: Independent with assistive device(s)   Gait / Transfers Assistance Needed: pt uses w/c or scooters in community for longer distances, amb with device in home;  ADL's / [place] Needed: Reports she requires occasional assist for bathing/dressing "when shoulder acts up" but she usually performs without assistance  Comments: Pt reports she spends most of her times upstairs since bed and bath are up there.   She has kitchenette upstairs and dtr provides dinner      Hand Dominance        Extremity/Trunk Assessment   Upper Extremity Assessment: Generalized weakness           Lower Extremity Assessment: Generalized weakness      Cervical / Trunk Assessment: Kyphotic  Communication   Communication: No difficulties  Cognition Arousal/Alertness:  (drowsy-pt awakened for particiipation-trouble staying awake initially during session) Behavior During Therapy: [facility] for tasks assessed/performed Overall  Cognitive Status: Within Functional Limits for tasks assessed                      General Comments      Exercises        Assessment/Plan    PT Assessment Patient needs continued PT services  PT Diagnosis Difficulty walking;Generalized weakness   PT Problem List Decreased strength;Decreased activity  tolerance;Decreased balance;Decreased mobility  PT Treatment Interventions DME instruction;Gait training;Functional mobility training;Therapeutic activities;Patient/family education;Therapeutic exercise;Balance training   PT Goals (Current goals can be found in the Care Plan section) Acute Rehab PT Goals Patient Stated Goal: none stated PT Goal Formulation: With patient Time For Goal Achievement: 04/15/15 Potential to Achieve Goals: Fair    Frequency Min 3X/week   Barriers to discharge        Co-evaluation               End of Session Equipment Utilized During Treatment: Oxygen Activity Tolerance: Patient tolerated treatment well Patient left: in chair;with call bell/phone within reach;with chair alarm set           Time: 1157-2620 PT Time Calculation (min) (ACUTE ONLY): 15 min   Charges:   PT Evaluation $PT Eval Moderate Complexity: 1 Procedure     PT G Codes:        Weston Anna, MPT Pager: [phone]

## 2015-04-01 NOTE — Progress Notes (Signed)
Kelsey Garcia is an inpatient in room 1422. I have called and spoken with her nurse, Anderson Malta this morning. She reports Kelsey Garcia will be able to come for her radiation treatment today. I have called [facility] and let them know where Kelsey Garcia is they can get her for her appointment.

## 2015-04-02 ENCOUNTER — Ambulatory Visit
Admission: RE | Admit: 2015-04-02 | Discharge: 2015-04-02 | Disposition: A | Discharge status: Home / Self Care | Payer: Medicare Other | Source: Ambulatory Visit | Attending: Radiation Oncology | Admitting: Radiation Oncology

## 2015-04-02 ENCOUNTER — Inpatient Hospital Stay: Admit: 2015-04-02 | Payer: Medicare Other | Admitting: Radiation Oncology

## 2015-04-02 ENCOUNTER — Ambulatory Visit: Payer: Medicare Other | Admitting: Adult Health

## 2015-04-02 ENCOUNTER — Encounter: Payer: Self-pay | Admitting: Radiation Oncology

## 2015-04-02 ENCOUNTER — Ambulatory Visit: Payer: Medicare Other | Admitting: Radiation Oncology

## 2015-04-02 LAB — CBC
HCT: 33.2 % — ABNORMAL LOW (ref 36.0–46.0)
Hemoglobin: 9.6 g/dL — ABNORMAL LOW (ref 12.0–15.0)
MCH: 29.4 pg (ref 26.0–34.0)
MCHC: 28.9 g/dL — AB (ref 30.0–36.0)
MCV: 101.5 fL — ABNORMAL HIGH (ref 78.0–100.0)
Platelets: 269 10*3/uL (ref 150–400)
RBC: 3.27 MIL/uL — ABNORMAL LOW (ref 3.87–5.11)
RDW: 17.1 % — AB (ref 11.5–15.5)
WBC: 5.3 10*3/uL (ref 4.0–10.5)

## 2015-04-02 LAB — BASIC METABOLIC PANEL
ANION GAP: 7 (ref 5–15)
BUN: 20 mg/dL (ref 6–20)
CALCIUM: 8.6 mg/dL — AB (ref 8.9–10.3)
CO2: 36 mmol/L — ABNORMAL HIGH (ref 22–32)
CREATININE: 0.62 mg/dL (ref 0.44–1.00)
Chloride: 102 mmol/L (ref 101–111)
Glucose, Bld: 113 mg/dL — ABNORMAL HIGH (ref 65–99)
Potassium: 3.9 mmol/L (ref 3.5–5.1)
SODIUM: 145 mmol/L (ref 135–145)

## 2015-04-02 MED ORDER — HYDROCODONE-ACETAMINOPHEN 10-325 MG PO TABS: 1.0000 | ORAL_TABLET | Freq: Four times a day (QID) | ORAL | Status: DC | PRN

## 2015-04-02 NOTE — Care Management Important Message (Signed)
Important Message  Patient Details  Name: Kelsey Garcia MRN: [id number] Date of Birth: [birth year]   Medicare Important Message Given:  Yes    Camillo Flaming 04/02/2015, 1:10 PMImportant Message  Patient Details  Name: Kelsey Garcia MRN: [id number] Date of Birth: 13-Sep-1945   Medicare Important Message Given:  Yes    Camillo Flaming 04/02/2015, 1:10 PM

## 2015-04-02 NOTE — Discharge Instructions (Signed)
Atrial Fibrillation °Atrial fibrillation is a type of heartbeat that is irregular or fast (rapid). If you have this condition, your heart keeps quivering in a weird (chaotic) way. This condition can make it so your heart cannot pump blood normally. Having this condition gives a person more risk for stroke, heart failure, and other heart problems. There are different types of atrial fibrillation. Talk with your doctor to learn about the type that you have. °HOME CARE °· Take over-the-counter and prescription medicines only as told by your doctor. °· If your doctor prescribed a blood-thinning medicine, take it exactly as told. Taking too much of it can cause bleeding. If you do not take enough of it, you will not have the protection that you need against stroke and other problems. °· Do not use any tobacco products. These include cigarettes, chewing tobacco, and e-cigarettes. If you need help quitting, ask your doctor. °· If you have apnea (obstructive sleep apnea), manage it as told by your doctor. °· Do not drink alcohol. °· Do not drink beverages that have caffeine. These include coffee, soda, and tea. °· Maintain a healthy weight. Do not use diet pills unless your doctor says they are safe for you. Diet pills may make heart problems worse. °· Follow diet instructions as told by your doctor. °· Exercise regularly as told by your doctor. °· Keep all follow-up visits as told by your doctor. This is important. °GET HELP IF: °· You notice a change in the speed, rhythm, or strength of your heartbeat. °· You are taking a blood-thinning medicine and you notice more bruising. °· You get tired more easily when you move or exercise. °GET HELP RIGHT AWAY IF: °· You have pain in your chest or your belly (abdomen). °· You have sweating or weakness. °· You feel sick to your stomach (nauseous). °· You notice blood in your throw up (vomit), poop (stool), or pee (urine). °· You are short of breath. °· You suddenly have swollen feet  and ankles. °· You feel dizzy. °· Your suddenly get weak or numb in your face, arms, or legs, especially if it happens on one side of your body. °· You have trouble talking, trouble understanding, or both. °· Your face or your eyelid droops on one side. °These symptoms may be an emergency. Do not wait to see if the symptoms will go away. Get medical help right away. Call your local emergency services (911 in the [place] Do not drive yourself to the hospital. °  °This information is not intended to replace advice given to you by your health care provider. Make sure you discuss any questions you have with your health care provider. °  °Document Released: 11/05/2007 Document Revised: 10/17/2014 Document Reviewed: 05/23/2014 °Elsevier Interactive Patient Education ©2016 [employer] ° °

## 2015-04-02 NOTE — Discharge Summary (Signed)
Physician Discharge Summary  Kelsey Garcia XLK:[id number] DOB: December 11, 1945 DOA: 03/30/2015  PCP: Darden Amber, PA  Admit date: 03/30/2015 Discharge date: 04/02/2015  Recommendations for Outpatient Follow-up:  1. Pt will need to follow up with PCP in 2-3 weeks post discharge 2. Please obtain BMP to evaluate electrolytes and kidney function 3. Please also check CBC to evaluate Hg and Hct levels 4. Please note that sputum culture resulted with pseudomonas and was back when pt already left the hospital, ID on call consulted and recommended no treatment unless pt symptomatic. Pt has not had fevers here and WBC has been normal, pt made aware over the phone, confirmed no specific symptoms and said she feels better.   Discharge Diagnoses:  Active Problems:   Atrial fibrillation (HCC)   Atrial fibrillation with RVR (HCC)  Discharge Condition: Stable  Diet recommendation: Heart healthy diet discussed in details    Brief narrative:    70 y.o. female with history of lung cancer, who presents for evaluation of fatigue and shortness of breath, several weeks in duration, associated with some mid chest area pressure, no specific alleviating or aggravating factors, no fevers, no chills, no abd or urinary concerns. The last 2 days. Is evaluated in the emergency department on 03/27/2015 and 03/28/2015 for this and was sent home but has not gotten much better.  In ED, pt noted to be hemodynamically stable but in a-fib with RVR, HR in 120's. TRH asked to admit for further evaluation. Telemetry unit requested in case Cardizem drip needed.   Assessment/Plan:    Active Problems:  Atrial fibrillation with RVR (HCC), chronic, CHADS 2 score 4 - HR is more stable at this time, rate control  - continue home regimen of metoprolol and Cardizem  - has refused AC in the past and still refusing    Chronic respiratory failure ([place]) due to COPD  - on oxygen at home at all times 2-3 L [place] - stable at this  time  - please see note above about pseudomonas    Essential hypertension - Cont cardizem, metoprolol   Depression and anxiety  - Thus far stable, no suicidal or homicidal ideations. - Continue home medications   Chronic diastolic (congestive) heart failure ([place])  - 2-D echo on 03/07/14 showed EF 50-60%. Patient does not have leg edema, CHF compensated - weight stable 78 - 79 kg   Squamous Cell Lung CA - Patient was seen by Radiation Oncology with recs for outpatient PET and follow up RT - radiation therapy scheduled for 3 pm today    Leukopenia - mild, resolved    Anemia of chronic disease, IDA - no signs of bleeding   Code Status: Full.  Family Communication: plan of care discussed with the patient, wants to go home  Disposition Plan: Home   IV access:  Peripheral IV  Procedures and diagnostic studies:   Dg Chest 2 View 03/30/2015 Slight interval improvement in hazy lingular airspace density. Stable known thoracic spine compression fractures. Stable cardiomegaly.   Ct Angio Chest Pe W/cm &/or Wo Cm 03/30/2015 No pulmonary emboli or acute abnormality. 2. Soft tissue thickening in the left hilum, causing moderate to marked concentric luminal narrowing involving the proximal left upper lobe bronchus and moderate concentric luminal narrowing involving the proximal lingular bronchus. This may represent the patient's recently diagnosed malignancy. 3. Mild further improvement in lingular atelectasis. 4. Stable mildly prominent precarinal lymph no without hypermetabolism.   Medical Consultants:  None   Other Consultants:  None  IAnti-Infectives:   None  Faye Ramsay, MD [facility] Pager [phone]     Discharge Exam: Filed Vitals:   04/01/15 2156 04/02/15 0513  BP: 98/50 103/67  Pulse: 89 67  Temp: 97.5 F (36.4 C) 97.6 F (36.4 C)  Resp: 20 20   Filed Vitals:   04/01/15 1442 04/01/15 2156 04/02/15 0513 04/02/15 0932  BP:  101/53 98/50 103/67   Pulse: 73 89 67   Temp: 97.9 F (36.6 C) 97.5 F (36.4 C) 97.6 F (36.4 C)   TempSrc: Oral Oral Oral   Resp: '20 20 20   '$ Height:      Weight:   79.1 kg (174 lb 6.1 oz)   SpO2: 100% 100% 100% 98%    General: Pt is alert, follows commands appropriately, not in acute distress Cardiovascular: Irregular rate and rhythm, S1/S2 +, no murmurs, no rubs, no gallops Respiratory: Clear to auscultation bilaterally, no wheezing, no crackles, no rhonchi Abdominal: Soft, non tender, non distended, bowel sounds +, no guarding   Discharge Instructions  Discharge Instructions    Diet - low sodium heart healthy    Complete by:  As directed      Increase activity slowly    Complete by:  As directed             Medication List    STOP taking these medications        LORazepam 1 MG tablet  Commonly known as:  ATIVAN      TAKE these medications        albuterol (2.5 MG/3ML) 0.083% nebulizer solution  Commonly known as:  PROVENTIL  Take 3 mLs (2.5 mg total) by nebulization every 6 (six) hours as needed for wheezing.     VENTOLIN HFA 108 (90 Base) MCG/ACT inhaler  Generic drug:  albuterol  INHALE ONE TO TWO PUFFS BY MOUTH EVERY 6 HOURS AS NEEDED FOR WHEEZING     aspirin 325 MG EC tablet  Take 650 mg by mouth daily with breakfast.     busPIRone 10 MG tablet  Commonly known as:  BUSPAR  Take 10 mg by mouth 3 (three) times daily.     diazepam 2 MG tablet  Commonly known as:  VALIUM  Take 1 tablet (2 mg total) by mouth every 6 (six) hours as needed for anxiety.     diltiazem 120 MG tablet  Commonly known as:  CARDIZEM  Take 1 tablet (120 mg total) by mouth every 8 (eight) hours.     FLUoxetine 20 MG capsule  Commonly known as:  PROZAC  TAKE THREE CAPSULES BY MOUTH ONCE DAILY     furosemide 20 MG tablet  Commonly known as:  LASIX  Take 40 mg by mouth daily.     guaiFENesin 600 MG 12 hr tablet  Commonly known as:  MUCINEX  Take 600 mg by mouth 2 (two)  times daily as needed for cough.     HYDROcodone-acetaminophen 10-325 MG tablet  Commonly known as:  NORCO  Take 1 tablet by mouth every 6 (six) hours as needed for moderate pain (Pain or Dyspnea).     HYDROcodone-homatropine 5-1.5 MG/5ML syrup  Commonly known as:  HYCODAN  Take 2.5 mLs by mouth 2 (two) times daily as needed for cough.     ipratropium-albuterol 0.5-2.5 (3) MG/3ML Soln  Commonly known as:  DUONEB  Take 3 mLs by nebulization 3 (three) times daily.     metoprolol succinate 25 MG 24 hr tablet  Commonly known as:  TOPROL-XL  Take 1 tablet (25 mg total) by mouth daily.     NON FORMULARY  Place 1 drop into both eyes 2 (two) times daily as needed (hurting, dry). thera- tears     pantoprazole 40 MG tablet  Commonly known as:  PROTONIX  TAKE ONE TABLET BY MOUTH TWICE DAILY     predniSONE 5 MG tablet  Commonly known as:  DELTASONE  Take 1 tablet (5 mg total) by mouth daily with breakfast.     SONAFINE  Apply 1 application topically 3 (three) times daily as needed (for radiation).     SPIRIVA HANDIHALER 18 MCG inhalation capsule  Generic drug:  tiotropium  PLACE ONE CAPSULE INTO INHALER AND INHALE DAILY     SYMBICORT 160-4.5 MCG/ACT inhaler  Generic drug:  budesonide-formoterol  INHALE TWO PUFFS BY MOUTH TWICE DAILY           Follow-up Information    Follow up with Darden Amber, PA.   Contact information:   [place] [address] [phone]       Call Faye Ramsay, MD.   Specialty:  Internal Medicine   Why:  As needed   Contact information:   [address] [place] [address] [phone]        The results of significant diagnostics from this hospitalization (including imaging, microbiology, ancillary and laboratory) are listed below for reference.     Microbiology: Recent Results (from the past 240 hour(s))  Culture, expectorated sputum-assessment     Status: None   Collection  Time: 03/30/15  3:20 PM  Result Value Ref Range Status   Specimen Description SPUTUM  Final   Special Requests Normal  Final   Sputum evaluation THIS SPECIMEN IS ACCEPTABLE FOR SPUTUM CULTURE  Final   Report Status 03/30/2015 FINAL  Final  Culture, respiratory (NON-Expectorated)     Status: None (Preliminary result)   Collection Time: 03/30/15  3:20 PM  Result Value Ref Range Status   Specimen Description SPUTUM  Final   Special Requests NONE  Final   Gram Stain   Final    ABUNDANT WBC PRESENT,BOTH PMN AND MONONUCLEAR FEW SQUAMOUS EPITHELIAL CELLS PRESENT FEW GRAM POSITIVE COCCI IN CLUSTERS Performed at [facility]    Culture   Final    MODERATE PSEUDOMONAS AERUGINOSA Performed at [facility]    Report Status PENDING  Incomplete     Labs: Basic Metabolic Panel:  Recent Labs Lab 03/28/15 1210 03/30/15 1450 03/30/15 2004 03/31/15 0122 04/01/15 0503 04/02/15 0459  NA 140 143  --  143 145 145  K 4.7 4.5  --  4.6 4.2 3.9  CL 101 101  --  102 108 102  CO2 33* 34*  --  32 33* 36*  GLUCOSE 112* 114*  --  232* 110* 113*  BUN 14 7  --  11 22* 20  CREATININE 0.54 0.51 0.69 0.63 0.70 0.62  CALCIUM 8.4* 9.0  --  8.9 9.0 8.6*   Liver Function Tests:  Recent Labs Lab 03/27/15 1003  AST 11*  ALT 12*  ALKPHOS 103  BILITOT 1.0  PROT 5.7*  ALBUMIN 3.2*    Recent Labs Lab 03/27/15 1003  LIPASE 19   No results for input(s): AMMONIA in the last 168 hours. CBC:  Recent Labs Lab 03/27/15 1003  03/30/15 1450 03/30/15 2004 03/31/15 0122 04/01/15 0503 04/02/15 0459  WBC 4.5  < > 3.9* 2.8* 2.5* 5.5 5.3  NEUTROABS 2.8  --  2.6  --   --   --   --   HGB 9.8*  < > 10.9* 9.6* 10.4* 9.5* 9.6*  HCT 33.3*  < > 36.2 31.5* 34.8* 32.3* 33.2*  MCV 99.1  < > 98.1 96.9 98.0 101.6* 101.5*  PLT 308  < > 275 261 308 273 269  < > = values in this interval not displayed. Cardiac Enzymes:  Recent Labs Lab 03/30/15 2004 03/31/15 0125 03/31/15 0538  TROPONINI  <0.03 <0.03 <0.03   BNP: BNP (last 3 results)  Recent Labs  02/04/15 1142 02/24/15 0524 03/27/15 1013  BNP 217.6* 306.5* 227.9*     SIGNED: Time coordinating discharge: 30 minutes  Faye Ramsay, MD  [facility] 04/02/2015, 10:24 AM Pager [phone]  If 7PM-7AM, please contact night-coverage [email or web address] Password [email or web address]

## 2015-04-02 NOTE — Consult Note (Addendum)
   [facility] CM Inpatient Consult   04/02/2015  Kelsey Garcia 1945-11-07 [id number]    Patient screened for [facility] services. Patient has been active in the recent past. She reports she would like to be followed by [facility] again. Consents have previously been signed with her daughter, Kelsey Garcia, as emergency contact at [phone]. [name] indicates she continues to have issues with transportation, especially to her cancer treatments. Left voicemail for [facility] Licensed CSW to make aware of this. Also noted in [facility]'s Licensed CSW's notes that information was given for [facility] for transportation needs for treatments. [name] states she did not follow up as she did not remember to do so. Writer printed out information for [facility] for her to follow up on for transportation needs. She is agreeable to do this. In the meantime, will request for patient to be assigned again to [facility] for disease and symptom management for COPD. [name] appreciative of visit. Made inpatient RNCM aware.  Confirmed best contact number as [phone]. States her daughter, Kelsey Garcia number is [phone].   [name], MSN-Ed, RN,BSN [facility] Liaison [phone]

## 2015-04-03 ENCOUNTER — Ambulatory Visit
Admission: RE | Admit: 2015-04-03 | Discharge: 2015-04-03 | Disposition: A | Discharge status: Home / Self Care | Payer: Medicare Other | Source: Ambulatory Visit | Attending: Radiation Oncology | Admitting: Radiation Oncology

## 2015-04-03 ENCOUNTER — Ambulatory Visit: Payer: Medicare Other | Admitting: Radiation Oncology

## 2015-04-03 ENCOUNTER — Telehealth: Payer: Self-pay

## 2015-04-03 LAB — CULTURE, RESPIRATORY W GRAM STAIN

## 2015-04-03 LAB — CULTURE, RESPIRATORY

## 2015-04-03 NOTE — Telephone Encounter (Signed)
[name] [name] called me today to tell me that her mom refused to come to radiation treatment today. She has tried very hard to convince her to come, but feels like her mom is overwhelmed by the amount of effort it takes for her to make the trip here. I encouraged her to keep trying to encourage her mom to come to radiation, as it is best to have daily consistent doses. She states she will continue to talk to her mom about the importance of radiation, and keep me updated as necessary.

## 2015-04-04 ENCOUNTER — Telehealth: Payer: Self-pay

## 2015-04-04 ENCOUNTER — Emergency Department (HOSPITAL_COMMUNITY): Payer: Medicare Other

## 2015-04-04 ENCOUNTER — Encounter (HOSPITAL_COMMUNITY): Payer: Self-pay

## 2015-04-04 ENCOUNTER — Ambulatory Visit
Admit: 2015-04-04 | Discharge: 2015-04-04 | Disposition: A | Discharge status: Home / Self Care | Payer: Medicare Other | Attending: Radiation Oncology | Admitting: Radiation Oncology

## 2015-04-04 ENCOUNTER — Inpatient Hospital Stay (HOSPITAL_COMMUNITY)
Admission: EM | Admit: 2015-04-04 | Discharge: 2015-04-07 | DRG: 190 | Disposition: A | Discharge status: Skilled Nursing Facility | Payer: Medicare Other | Attending: Internal Medicine | Admitting: Internal Medicine

## 2015-04-04 ENCOUNTER — Ambulatory Visit: Admission: RE | Admit: 2015-04-04 | Payer: Medicare Other | Source: Ambulatory Visit | Admitting: Radiation Oncology

## 2015-04-04 DIAGNOSIS — Z825 Family history of asthma and other chronic lower respiratory diseases: Secondary | ICD-10-CM | POA: Diagnosis not present

## 2015-04-04 DIAGNOSIS — B965 Pseudomonas (aeruginosa) (mallei) (pseudomallei) as the cause of diseases classified elsewhere: Secondary | ICD-10-CM | POA: Diagnosis present

## 2015-04-04 DIAGNOSIS — C3490 Malignant neoplasm of unspecified part of unspecified bronchus or lung: Secondary | ICD-10-CM | POA: Diagnosis present

## 2015-04-04 DIAGNOSIS — J9621 Acute and chronic respiratory failure with hypoxia: Secondary | ICD-10-CM | POA: Diagnosis present

## 2015-04-04 DIAGNOSIS — J441 Chronic obstructive pulmonary disease with (acute) exacerbation: Secondary | ICD-10-CM | POA: Diagnosis not present

## 2015-04-04 DIAGNOSIS — J9611 Chronic respiratory failure with hypoxia: Secondary | ICD-10-CM | POA: Diagnosis not present

## 2015-04-04 DIAGNOSIS — Z87891 Personal history of nicotine dependence: Secondary | ICD-10-CM

## 2015-04-04 DIAGNOSIS — I4891 Unspecified atrial fibrillation: Secondary | ICD-10-CM

## 2015-04-04 DIAGNOSIS — I5032 Chronic diastolic (congestive) heart failure: Secondary | ICD-10-CM | POA: Diagnosis not present

## 2015-04-04 DIAGNOSIS — C3492 Malignant neoplasm of unspecified part of left bronchus or lung: Secondary | ICD-10-CM | POA: Diagnosis not present

## 2015-04-04 DIAGNOSIS — F329 Major depressive disorder, single episode, unspecified: Secondary | ICD-10-CM | POA: Diagnosis present

## 2015-04-04 DIAGNOSIS — I10 Essential (primary) hypertension: Secondary | ICD-10-CM | POA: Diagnosis present

## 2015-04-04 DIAGNOSIS — J9612 Chronic respiratory failure with hypercapnia: Secondary | ICD-10-CM | POA: Diagnosis not present

## 2015-04-04 DIAGNOSIS — F419 Anxiety disorder, unspecified: Secondary | ICD-10-CM | POA: Diagnosis present

## 2015-04-04 DIAGNOSIS — R0602 Shortness of breath: Secondary | ICD-10-CM | POA: Diagnosis not present

## 2015-04-04 DIAGNOSIS — Z7982 Long term (current) use of aspirin: Secondary | ICD-10-CM

## 2015-04-04 DIAGNOSIS — D649 Anemia, unspecified: Secondary | ICD-10-CM | POA: Diagnosis present

## 2015-04-04 DIAGNOSIS — J9610 Chronic respiratory failure, unspecified whether with hypoxia or hypercapnia: Secondary | ICD-10-CM | POA: Diagnosis present

## 2015-04-04 LAB — COMPREHENSIVE METABOLIC PANEL
ALT: 31 U/L (ref 14–54)
AST: 22 U/L (ref 15–41)
Albumin: 3.2 g/dL — ABNORMAL LOW (ref 3.5–5.0)
Alkaline Phosphatase: 88 U/L (ref 38–126)
Anion gap: 8 (ref 5–15)
BILIRUBIN TOTAL: 1 mg/dL (ref 0.3–1.2)
BUN: 14 mg/dL (ref 6–20)
CO2: 33 mmol/L — ABNORMAL HIGH (ref 22–32)
Calcium: 8.6 mg/dL — ABNORMAL LOW (ref 8.9–10.3)
Chloride: 103 mmol/L (ref 101–111)
Creatinine, Ser: 0.44 mg/dL (ref 0.44–1.00)
GFR calc Af Amer: >60 mL/min (ref >60)
Glucose, Bld: 120 mg/dL — ABNORMAL HIGH (ref 65–99)
POTASSIUM: 3.4 mmol/L — AB (ref 3.5–5.1)
Sodium: 144 mmol/L (ref 135–145)
TOTAL PROTEIN: 5.7 g/dL — AB (ref 6.5–8.1)

## 2015-04-04 LAB — I-STAT TROPONIN, ED: Troponin i, poc: 0.01 ng/mL (ref 0.00–0.08)

## 2015-04-04 LAB — CBC WITH DIFFERENTIAL/PLATELET
BASOS ABS: 0 10*3/uL (ref 0.0–0.1)
Basophils Relative: 0 %
Eosinophils Absolute: 0 10*3/uL (ref 0.0–0.7)
Eosinophils Relative: 0 %
HEMATOCRIT: 34.9 % — AB (ref 36.0–46.0)
Hemoglobin: 10.2 g/dL — ABNORMAL LOW (ref 12.0–15.0)
LYMPHS ABS: 0.9 10*3/uL (ref 0.7–4.0)
LYMPHS PCT: 17 %
MCH: 29.1 pg (ref 26.0–34.0)
MCHC: 29.2 g/dL — AB (ref 30.0–36.0)
MCV: 99.4 fL (ref 78.0–100.0)
MONO ABS: 0.6 10*3/uL (ref 0.1–1.0)
MONOS PCT: 11 %
NEUTROS ABS: 3.7 10*3/uL (ref 1.7–7.7)
Neutrophils Relative %: 72 %
Platelets: 222 10*3/uL (ref 150–400)
RBC: 3.51 MIL/uL — ABNORMAL LOW (ref 3.87–5.11)
RDW: 16.5 % — AB (ref 11.5–15.5)
WBC: 5.2 10*3/uL (ref 4.0–10.5)

## 2015-04-04 LAB — I-STAT CG4 LACTIC ACID, ED
LACTIC ACID, VENOUS: 0.72 mmol/L (ref 0.5–2.0)
Lactic Acid, Venous: 1.94 mmol/L (ref 0.5–2.0)

## 2015-04-04 LAB — GLUCOSE, CAPILLARY: Glucose-Capillary: 242 mg/dL — ABNORMAL HIGH (ref 65–99)

## 2015-04-04 MED ORDER — LEVOFLOXACIN 500 MG PO TABS
500.0000 mg | ORAL_TABLET | ORAL | Status: DC
  Administered 2015-04-04: 500 mg via ORAL
  Filled 2015-04-04: qty 1

## 2015-04-04 MED ORDER — SODIUM CHLORIDE 0.9 % IV SOLN: 250.0000 mL | INTRAVENOUS | Status: DC | PRN

## 2015-04-04 MED ORDER — DILTIAZEM HCL 25 MG/5ML IV SOLN
20.0000 mg | Freq: Once | INTRAVENOUS | Status: AC
  Administered 2015-04-04: 20 mg via INTRAVENOUS
  Filled 2015-04-04: qty 5

## 2015-04-04 MED ORDER — FUROSEMIDE 40 MG PO TABS
40.0000 mg | ORAL_TABLET | Freq: Every day | ORAL | Status: DC
  Administered 2015-04-05 – 2015-04-07 (×3): 40 mg via ORAL
  Filled 2015-04-04 (×4): qty 1

## 2015-04-04 MED ORDER — DIAZEPAM 2 MG PO TABS
2.0000 mg | ORAL_TABLET | Freq: Four times a day (QID) | ORAL | Status: DC | PRN
  Administered 2015-04-05: 2 mg via ORAL
  Filled 2015-04-04: qty 1

## 2015-04-04 MED ORDER — HYDROCODONE-HOMATROPINE 5-1.5 MG/5ML PO SYRP
2.5000 mL | ORAL_SOLUTION | Freq: Two times a day (BID) | ORAL | Status: DC | PRN
  Administered 2015-04-04 – 2015-04-06 (×4): 2.5 mL via ORAL
  Filled 2015-04-04 (×4): qty 5

## 2015-04-04 MED ORDER — METOPROLOL TARTRATE 1 MG/ML IV SOLN
5.0000 mg | Freq: Once | INTRAVENOUS | Status: AC
  Administered 2015-04-04: 5 mg via INTRAVENOUS
  Filled 2015-04-04: qty 5

## 2015-04-04 MED ORDER — ALBUTEROL SULFATE (2.5 MG/3ML) 0.083% IN NEBU
2.5000 mg | INHALATION_SOLUTION | Freq: Once | RESPIRATORY_TRACT | Status: AC
  Administered 2015-04-04: 2.5 mg via RESPIRATORY_TRACT
  Filled 2015-04-04: qty 3

## 2015-04-04 MED ORDER — ASPIRIN EC 325 MG PO TBEC
650.0000 mg | DELAYED_RELEASE_TABLET | Freq: Every day | ORAL | Status: DC
  Administered 2015-04-05 – 2015-04-07 (×3): 650 mg via ORAL
  Filled 2015-04-04 (×3): qty 2

## 2015-04-04 MED ORDER — TIOTROPIUM BROMIDE MONOHYDRATE 18 MCG IN CAPS
18.0000 ug | ORAL_CAPSULE | Freq: Every day | RESPIRATORY_TRACT | Status: DC
  Administered 2015-04-07: 18 ug via RESPIRATORY_TRACT
  Filled 2015-04-04: qty 5

## 2015-04-04 MED ORDER — DILTIAZEM HCL 60 MG PO TABS
120.0000 mg | ORAL_TABLET | Freq: Three times a day (TID) | ORAL | Status: DC
  Administered 2015-04-04 – 2015-04-07 (×9): 120 mg via ORAL
  Filled 2015-04-04 (×10): qty 2

## 2015-04-04 MED ORDER — MOMETASONE FURO-FORMOTEROL FUM 200-5 MCG/ACT IN AERO
2.0000 | INHALATION_SPRAY | Freq: Two times a day (BID) | RESPIRATORY_TRACT | Status: DC
  Filled 2015-04-04: qty 8.8

## 2015-04-04 MED ORDER — FLUOXETINE HCL 20 MG PO CAPS
60.0000 mg | ORAL_CAPSULE | Freq: Every day | ORAL | Status: DC
Start: 2015-04-05 — End: 2015-04-07
  Administered 2015-04-05 – 2015-04-07 (×3): 60 mg via ORAL
  Filled 2015-04-04 (×3): qty 3

## 2015-04-04 MED ORDER — PANTOPRAZOLE SODIUM 40 MG PO TBEC
40.0000 mg | DELAYED_RELEASE_TABLET | Freq: Two times a day (BID) | ORAL | Status: DC
  Administered 2015-04-04 – 2015-04-07 (×6): 40 mg via ORAL
  Filled 2015-04-04 (×6): qty 1

## 2015-04-04 MED ORDER — CETYLPYRIDINIUM CHLORIDE 0.05 % MT LIQD
7.0000 mL | Freq: Two times a day (BID) | OROMUCOSAL | Status: DC
  Administered 2015-04-04 – 2015-04-06 (×5): 7 mL via OROMUCOSAL

## 2015-04-04 MED ORDER — ONDANSETRON HCL 4 MG PO TABS: 4.0000 mg | ORAL_TABLET | Freq: Four times a day (QID) | ORAL | Status: DC | PRN

## 2015-04-04 MED ORDER — METOPROLOL SUCCINATE ER 25 MG PO TB24
25.0000 mg | ORAL_TABLET | Freq: Every day | ORAL | Status: DC
  Administered 2015-04-04 – 2015-04-07 (×4): 25 mg via ORAL
  Filled 2015-04-04 (×4): qty 1

## 2015-04-04 MED ORDER — BUSPIRONE HCL 5 MG PO TABS
10.0000 mg | ORAL_TABLET | Freq: Three times a day (TID) | ORAL | Status: DC
  Administered 2015-04-04 – 2015-04-07 (×8): 10 mg via ORAL
  Filled 2015-04-04 (×9): qty 2

## 2015-04-04 MED ORDER — IPRATROPIUM-ALBUTEROL 0.5-2.5 (3) MG/3ML IN SOLN
3.0000 mL | Freq: Once | RESPIRATORY_TRACT | Status: AC
  Administered 2015-04-04: 3 mL via RESPIRATORY_TRACT
  Filled 2015-04-04: qty 3

## 2015-04-04 MED ORDER — SODIUM CHLORIDE 0.9% FLUSH: 3.0000 mL | Freq: Two times a day (BID) | INTRAVENOUS | Status: DC

## 2015-04-04 MED ORDER — HYDROCODONE-ACETAMINOPHEN 10-325 MG PO TABS
1.0000 | ORAL_TABLET | Freq: Four times a day (QID) | ORAL | Status: DC | PRN
Start: 2015-04-04 — End: 2015-04-07
  Administered 2015-04-04 – 2015-04-07 (×6): 1 via ORAL
  Filled 2015-04-04 (×6): qty 1

## 2015-04-04 MED ORDER — ONDANSETRON HCL 4 MG/2ML IJ SOLN: 4.0000 mg | Freq: Four times a day (QID) | INTRAMUSCULAR | Status: DC | PRN

## 2015-04-04 MED ORDER — METHYLPREDNISOLONE SODIUM SUCC 125 MG IJ SOLR
60.0000 mg | Freq: Three times a day (TID) | INTRAMUSCULAR | Status: DC
  Administered 2015-04-04 – 2015-04-06 (×6): 60 mg via INTRAVENOUS
  Filled 2015-04-04 (×6): qty 2

## 2015-04-04 MED ORDER — ACETAMINOPHEN 325 MG PO TABS: 650.0000 mg | ORAL_TABLET | Freq: Four times a day (QID) | ORAL | Status: DC | PRN

## 2015-04-04 MED ORDER — ACETAMINOPHEN 650 MG RE SUPP
650.0000 mg | Freq: Four times a day (QID) | RECTAL | Status: DC | PRN
Start: 2015-04-04 — End: 2015-04-07

## 2015-04-04 MED ORDER — LEVALBUTEROL HCL 0.63 MG/3ML IN NEBU
0.6300 mg | INHALATION_SOLUTION | RESPIRATORY_TRACT | Status: DC
  Filled 2015-04-04: qty 3

## 2015-04-04 MED ORDER — DOCUSATE SODIUM 100 MG PO CAPS
100.0000 mg | ORAL_CAPSULE | Freq: Two times a day (BID) | ORAL | Status: DC
  Administered 2015-04-04 – 2015-04-07 (×6): 100 mg via ORAL
  Filled 2015-04-04 (×6): qty 1

## 2015-04-04 MED ORDER — LEVALBUTEROL HCL 0.63 MG/3ML IN NEBU: 0.6300 mg | INHALATION_SOLUTION | RESPIRATORY_TRACT | Status: DC

## 2015-04-04 MED ORDER — SODIUM CHLORIDE 0.9% FLUSH: 3.0000 mL | INTRAVENOUS | Status: DC | PRN

## 2015-04-04 MED ORDER — METHYLPREDNISOLONE SODIUM SUCC 125 MG IJ SOLR
125.0000 mg | Freq: Once | INTRAMUSCULAR | Status: AC
  Administered 2015-04-04: 125 mg via INTRAVENOUS
  Filled 2015-04-04: qty 2

## 2015-04-04 MED ORDER — LEVALBUTEROL HCL 0.63 MG/3ML IN NEBU: 0.6300 mg | INHALATION_SOLUTION | Freq: Four times a day (QID) | RESPIRATORY_TRACT | Status: DC | PRN

## 2015-04-04 MED ORDER — ENOXAPARIN SODIUM 40 MG/0.4ML ~~LOC~~ SOLN
40.0000 mg | SUBCUTANEOUS | Status: DC
  Administered 2015-04-04 – 2015-04-06 (×3): 40 mg via SUBCUTANEOUS
  Filled 2015-04-04 (×3): qty 0.4

## 2015-04-04 MED ORDER — SODIUM CHLORIDE 0.9% FLUSH
3.0000 mL | Freq: Two times a day (BID) | INTRAVENOUS | Status: DC
  Administered 2015-04-04 – 2015-04-07 (×5): 3 mL via INTRAVENOUS

## 2015-04-04 MED ORDER — CHLORHEXIDINE GLUCONATE 0.12 % MT SOLN
15.0000 mL | Freq: Two times a day (BID) | OROMUCOSAL | Status: DC
  Administered 2015-04-04 – 2015-04-07 (×6): 15 mL via OROMUCOSAL
  Filled 2015-04-04 (×6): qty 15

## 2015-04-04 MED ORDER — DILTIAZEM HCL 60 MG PO TABS
60.0000 mg | ORAL_TABLET | Freq: Once | ORAL | Status: AC
  Administered 2015-04-04: 60 mg via ORAL
  Filled 2015-04-04: qty 1

## 2015-04-04 MED ORDER — POTASSIUM CHLORIDE CRYS ER 20 MEQ PO TBCR
40.0000 meq | EXTENDED_RELEASE_TABLET | Freq: Once | ORAL | Status: AC
  Administered 2015-04-04: 40 meq via ORAL
  Filled 2015-04-04: qty 2

## 2015-04-04 NOTE — ED Notes (Signed)
Bed: WA02 Expected date:  Expected time:  Means of arrival:  Comments: EMS-SOB 

## 2015-04-04 NOTE — H&P (Addendum)
[facility] History and Physical  Kelsey Garcia YBF:[id number] DOB: September 23, 1945 DOA: 04/04/2015   PCP: Darden Amber, PA  Specialists: Followed by [name] with pulmonology. Also seen by radiation oncology  Chief Complaint: Shortness of breath  HPI: Kelsey Garcia is a 70 y.o. female with a past medical history of COPD, atrial fibrillation, lung cancer, receiving radiation treatment who was discharged on February 21 after being managed for atrial fibrillation with RVR. She apparently was doing well the first day after discharge. However, yesterday she started feeling short of breath. She started coughing, which was a dry cough. She was wheezing. Her symptoms got worse overnight and so she called EMS to bring her over to the hospital. She has had some chest discomfort. Has had some nausea but no vomiting. She has been taking her medications as prescribed. She tried taking her nebulizer treatments without any improvement. No recent antibiotic use. She has also taken her medications for atrial fibrillation. She is not an anticoagulation and has declined many times. She takes aspirin.  In the emergency department, patient was thought to have COPD exacerbation. She was given nebulizer treatments. She was also noted to be in rapid atrial fibrillation. We were consulted for further management.  Home Medications: Prior to Admission medications   Medication Sig Start Date End Date Taking? Authorizing Provider  albuterol (PROVENTIL) (2.5 MG/3ML) 0.083% nebulizer solution Take 3 mLs (2.5 mg total) by nebulization every 6 (six) hours as needed for wheezing. 09/21/14  Yes Maryann Mikhail, DO  aspirin 325 MG EC tablet Take 650 mg by mouth daily with breakfast.    Yes Historical Provider, MD  busPIRone (BUSPAR) 10 MG tablet Take 10 mg by mouth 3 (three) times daily. 04/22/14  Yes Historical Provider, MD  diazepam (VALIUM) 2 MG tablet Take 1 tablet (2 mg total) by mouth every 6 (six) hours as needed for  anxiety. 03/05/15  Yes Donne Hazel, MD  diltiazem (CARDIZEM) 120 MG tablet Take 1 tablet (120 mg total) by mouth every 8 (eight) hours. 03/12/15  Yes Rigoberto Noel, MD  FLUoxetine (PROZAC) 20 MG capsule TAKE THREE CAPSULES BY MOUTH ONCE DAILY Patient taking differently: Take 60 mg by mouth daily.  01/03/14  Yes Tasrif Ahmed, MD  furosemide (LASIX) 20 MG tablet Take 40 mg by mouth daily.    Yes Historical Provider, MD  guaiFENesin (MUCINEX) 600 MG 12 hr tablet Take 600 mg by mouth 2 (two) times daily as needed for cough.   Yes Historical Provider, MD  HYDROcodone-acetaminophen (NORCO) 10-325 MG tablet Take 1 tablet by mouth every 6 (six) hours as needed for moderate pain (Pain or Dyspnea). 04/02/15  Yes Theodis Blaze, MD  HYDROcodone-homatropine [facility]) 5-1.5 MG/5ML syrup Take 2.5 mLs by mouth 2 (two) times daily as needed for cough. 03/05/15  Yes Donne Hazel, MD  ipratropium-albuterol (DUONEB) 0.5-2.5 (3) MG/3ML SOLN Take 3 mLs by nebulization 3 (three) times daily. 03/07/15  Yes Tammy S Parrett, NP  metoprolol succinate (TOPROL-XL) 25 MG 24 hr tablet Take 1 tablet (25 mg total) by mouth daily. 02/14/15  Yes Brittainy Erie Noe, PA-C  NON FORMULARY Place 1 drop into both eyes 2 (two) times daily as needed (hurting, dry). thera- tears   Yes Historical Provider, MD  pantoprazole (PROTONIX) 40 MG tablet TAKE ONE TABLET BY MOUTH TWICE DAILY 01/03/14  Yes Dellia Nims, MD  SPIRIVA HANDIHALER 18 MCG inhalation capsule PLACE ONE CAPSULE INTO INHALER AND INHALE DAILY 03/07/15  Yes Kara Mead  V, MD  SYMBICORT 160-4.5 MCG/ACT inhaler INHALE TWO PUFFS BY MOUTH TWICE DAILY 02/06/15  Yes Rigoberto Noel, MD  VENTOLIN HFA 108 (90 Base) MCG/ACT inhaler INHALE ONE TO TWO PUFFS BY MOUTH EVERY 6 HOURS AS NEEDED FOR WHEEZING 03/07/15  Yes Rigoberto Noel, MD  Wound Dressings (SONAFINE) Apply 1 application topically 3 (three) times daily as needed (for radiation).    Yes Historical Provider, MD  predniSONE (DELTASONE) 5 MG  tablet Take 1 tablet (5 mg total) by mouth daily with breakfast. Patient not taking: Reported on 04/04/2015 03/06/15   Donne Hazel, MD    Allergies:  Allergies  Allergen Reactions  . Ace Inhibitors Cough  . Codeine Swelling    Face and lips swelling  Patient report Can tolerate hydrocodone  . Pseudoeph-Doxylamine-Dm-Apap Itching  . Diphenhydramine Hcl Other (See Comments)    "feels like my skin is crawling, and legs twitches"  . Erythromycin Diarrhea  . Nsaids Nausea And Vomiting  . Nyquil [Pseudoeph-Doxylamine-Dm-Apap] Itching  . Tramadol Hcl Other (See Comments)    stomach pain, hallucinations  . Fosamax [Alendronate] Other (See Comments)    Nausea and abdominal bloating.   . Tomato Other (See Comments)    Stomach swells  . Orange Fruit [Citrus] Rash  . Pseudoephedrine Palpitations    Past Medical History: Past Medical History  Diagnosis Date  . Atrial fibrillation ([place])   . Hypertension   . Insomnia   . Nonischemic cardiomyopathy ([place])   . Hyperlipidemia   . Anxiety   . Cholelithiasis   . Insomnia   . Mitral regurgitation     noted 2010  . H/O epistaxis   . Rhinitis, allergic   . CHF (congestive heart failure) ([place])   . Pneumonia     "several times w/exacerbations of the COPD; nothing in the last year" (07/15/2012)  . COPD (chronic obstructive pulmonary disease) ([place])     as of 7/13 on 2-3L, pfts 10/2008 with mod obstruction  . Chronic bronchitis with COPD (chronic obstructive pulmonary disease) ([place])   . Shortness of breath     "all the time right now" (07/15/2012)  . GERD (gastroesophageal reflux disease)   . RXVQMGQQ(761.9)     "weekly" (07/15/2012)  . Migraines     "weekly for awhile; cleared up as I got older" (07/15/2012)  . DJD (degenerative joint disease)   . Arthritis     "all over" (07/15/2012)  . OCD (obsessive compulsive disorder)   . OCD (obsessive compulsive disorder)   . Depression     h/o SI; "last time I was really serious about it was ~ 1997"  (07/15/2012)  . Thoracic vertebral fracture ([place]) 11/03/2014  . Primary lung cancer [facility])     Past Surgical History  Procedure Laterality Date  . Tubal ligation  1972  . Cardioversion  2003; 07/2003  . Video bronchoscopy Bilateral 02/27/2015    Procedure: VIDEO BRONCHOSCOPY WITH FLUORO;  Surgeon: Chesley Mires, MD;  Location: [facility];  Service: Cardiopulmonary;  Laterality: Bilateral;    Social History: Lives in [place] with her daughter. No smoking currently. She quit in October 2016. Occasional alcohol use. No illicit drug use. Usually independent with daily activities.  Family History:  Family History  Problem Relation Age of Onset  . Asthma    . Emphysema    . Allergies    . Cancer      aunt had several types of cancer  . COPD Mother   . Emphysema Mother   .  Cirrhosis Father      Review of Systems - History obtained from the patient General ROS: positive for  - fatigue Psychological ROS: positive for - anxiety Ophthalmic ROS: negative ENT ROS: negative Allergy and Immunology ROS: negative Hematological and Lymphatic ROS: negative Endocrine ROS: negative Respiratory ROS: as in hpi Cardiovascular ROS: as in hpi Gastrointestinal ROS: no abdominal pain, change in bowel habits, or black or bloody stools Genito-Urinary ROS: no dysuria, trouble voiding, or hematuria Musculoskeletal ROS: negative Neurological ROS: no TIA or stroke symptoms Dermatological ROS: negative  Physical Examination  Filed Vitals:   04/04/15 1430 04/04/15 1445 04/04/15 1500 04/04/15 1611  BP: 120/93 131/100 136/98 119/74  Pulse: 98 114 128 116  Temp:    97.8 F (36.6 C)  TempSrc:    Oral  Resp: 17 20 32 22  Height:      Weight:      SpO2: 100% 95% 92% 98%    BP 119/74 mmHg  Pulse 116  Temp(Src) 97.8 F (36.6 C) (Oral)  Resp 22  Ht 5' 5"  (1.651 m)  Wt 83.915 kg (185 lb)  BMI 30.79 kg/m2  SpO2 98%  General appearance: alert, cooperative, appears stated age and no distress Head:  Normocephalic, without obvious abnormality, atraumatic Eyes: conjunctivae/corneas clear. PERRL, EOM's intact. Fundi benign. Throat: lips, mucosa, and tongue normal; teeth and gums normal Neck: no adenopathy, no carotid bruit, no JVD, supple, symmetrical, trachea midline and thyroid not enlarged, symmetric, no tenderness/mass/nodules Resp: Coarse breath sounds. Diminished entry at the bases. Scattered wheezes. No rhonchi. No crackles. Cardio: regular rate and rhythm, S1, S2 normal, no murmur, click, rub or gallop GI: soft, non-tender; bowel sounds normal; no masses,  no organomegaly Extremities: extremities normal, atraumatic, no cyanosis or edema Pulses: 2+ and symmetric Skin: Skin color, texture, turgor normal. No rashes or lesions Lymph nodes: Cervical, supraclavicular, and axillary nodes normal. Neurologic: Awake and alert. Oriented 3. No focal neurological deficits.  Laboratory Data: Results for orders placed or performed during the hospital encounter of 04/04/15 (from the past 48 hour(s))  CBC with Differential/Platelet     Status: Abnormal   Collection Time: 04/04/15  8:58 AM  Result Value Ref Range   WBC 5.2 4.0 - 10.5 K/uL   RBC 3.51 (L) 3.87 - 5.11 MIL/uL   Hemoglobin 10.2 (L) 12.0 - 15.0 g/dL   HCT 34.9 (L) 36.0 - 46.0 %   MCV 99.4 78.0 - 100.0 fL   MCH 29.1 26.0 - 34.0 pg   MCHC 29.2 (L) 30.0 - 36.0 g/dL   RDW 16.5 (H) 11.5 - 15.5 %   Platelets 222 150 - 400 K/uL   Neutrophils Relative % 72 %   Neutro Abs 3.7 1.7 - 7.7 K/uL   Lymphocytes Relative 17 %   Lymphs Abs 0.9 0.7 - 4.0 K/uL   Monocytes Relative 11 %   Monocytes Absolute 0.6 0.1 - 1.0 K/uL   Eosinophils Relative 0 %   Eosinophils Absolute 0.0 0.0 - 0.7 K/uL   Basophils Relative 0 %   Basophils Absolute 0.0 0.0 - 0.1 K/uL  Comprehensive metabolic panel     Status: Abnormal   Collection Time: 04/04/15  8:58 AM  Result Value Ref Range   Sodium 144 135 - 145 mmol/L   Potassium 3.4 (L) 3.5 - 5.1 mmol/L    Chloride 103 101 - 111 mmol/L   CO2 33 (H) 22 - 32 mmol/L   Glucose, Bld 120 (H) 65 - 99 mg/dL   BUN  14 6 - 20 mg/dL   Creatinine, Ser 0.44 0.44 - 1.00 mg/dL   Calcium 8.6 (L) 8.9 - 10.3 mg/dL   Total Protein 5.7 (L) 6.5 - 8.1 g/dL   Albumin 3.2 (L) 3.5 - 5.0 g/dL   AST 22 15 - 41 U/L   ALT 31 14 - 54 U/L   Alkaline Phosphatase 88 38 - 126 U/L   Total Bilirubin 1.0 0.3 - 1.2 mg/dL   GFR calc non Af Amer >60 >60 mL/min   GFR calc Af Amer >60 >60 mL/min    Comment: (NOTE) The eGFR has been calculated using the CKD EPI equation. This calculation has not been validated in all clinical situations. eGFR's persistently <60 mL/min signify possible Chronic Kidney Disease.    Anion gap 8 5 - 15  I-Stat CG4 Lactic Acid, ED     Status: None   Collection Time: 04/04/15  9:31 AM  Result Value Ref Range   Lactic Acid, Venous 0.72 0.5 - 2.0 mmol/L  I-stat troponin, ED     Status: None   Collection Time: 04/04/15 11:32 AM  Result Value Ref Range   Troponin i, poc 0.01 0.00 - 0.08 ng/mL   Comment 3            Comment: Due to the release kinetics of cTnI, a negative result within the first hours of the onset of symptoms does not rule out myocardial infarction with certainty. If myocardial infarction is still suspected, repeat the test at appropriate intervals.   I-Stat CG4 Lactic Acid, ED     Status: None   Collection Time: 04/04/15 11:34 AM  Result Value Ref Range   Lactic Acid, Venous 1.94 0.5 - 2.0 mmol/L    Radiology Reports: Dg Chest 2 View  04/04/2015  CLINICAL DATA:  Worsening cough, congestion and shortness of breath. EXAM: CHEST  2 VIEW COMPARISON:  03/30/2015 FINDINGS: Chronic elevation of the right hemidiaphragm. No focal airspace disease or pulmonary edema. Heart and mediastinum are stable and within normal limits. No large pleural effusions. Again noted are compression deformities in the mid thoracic spine. IMPRESSION: No active cardiopulmonary disease. Electronically Signed    By: Markus Daft M.D.   On: 04/04/2015 08:54    My interpretation of Electrocardiogram: Atrophic fibrillation at 118 bpm. No concerning ST or T-wave changes.  Problem List  Principal Problem:   Acute exacerbation of chronic obstructive pulmonary disease (COPD) ([place]) Active Problems:   Essential hypertension   Atrial fibrillation with rapid ventricular response (HCC)   Respiratory failure, chronic (HCC)   Chronic diastolic (congestive) heart failure (HCC)   Squamous cell lung cancer ([place])   Assessment: This is a 70 year old Caucasian female with past medical history as stated earlier, who presents with worsening shortness of breath since yesterday. She is thought to have acute COPD exacerbation. She also has atrial fibrillation with RVR.  Plan: #1 acute COPD exacerbation with acute respiratory failure: Patient does have chronic respiratory failure and is on 2 L of oxygen by nasal cannula at home. Patient will be admitted to the hospital. She'll be given steroids. Nebulizer treatments and antibiotics. Since this is her fourth visit to the emergency department and second hospitalization in the last few weeks, we will consult her pulmonologist. We will use Xopenex instead of albuterol due to her heart rate. Interestingly, her sputum cultures from February 18 grew Pseudomonas. This was discussed with the ID physician at that time and since patient did not have any clear infiltrate and  nor was she symptomatic, she was not treated. Significance of this finding is unclear. No infiltrates noted on chest x-ray from today either.  #2 atrial fibrillation with RVR: Patient was given intravenous Cardizem and then given intravenous metoprolol with improvement in heart rate. She will be back on her home medication regimen. She will be monitored closely on telemetry. It is quite possible that the albuterol at home might be contributing to elevated heart rate. Patient is not on anticoagulation due to personal  preference. She takes aspirin daily. The anticoagulation issue has been discussed with the patient multiple times by her cardiologist. Chads 2 vascular score is 4.  #3 Squamous cell lung cancer: She is receiving radiation treatment. It appears that the radiation team is aware that patient is hospitalized. She did not get her treatment today. Should get treatment tomorrow.  #4 history of essential hypertension: Monitor blood pressures closely.  #5 Normocytic anemia: Appears to be at baseline. Continue to monitor.  #6 history of depression and anxiety: Continue home medications.  #7 History of chronic diastolic congestive heart failure: Echocardiogram from last year suggested in the normal EF. She appears to be well compensated. Her weight is close to baseline. Continue Lasix.   DVT Prophylaxis: Lovenox Code Status: Full code Family Communication: Discussed with the patient  Disposition Plan: Admit to telemetry   Further management decisions will depend on results of further testing and patient's response to treatment.   [facility]  [facility] Pager [phone]  If 7PM-7AM, please contact night-coverage [email or web address] Password [facility]  04/04/2015, 4:19 PM

## 2015-04-04 NOTE — ED Notes (Signed)
15:55 bed ready .

## 2015-04-04 NOTE — ED Notes (Signed)
Per EMS- patient reports that she was seen in the ED for SOB yesterday. Today, patient states she woke with SOB and a cough. Patient did not use her inhalers. EMS states she was given an Albuterol '5mg'$  via neb prior to arrival to the ED. Patient wears home O2 2L/min via [place].

## 2015-04-04 NOTE — Consult Note (Signed)
Name: Kelsey Garcia MRN: [id number] DOB: [birth year]    ADMISSION DATE:  04/04/2015 CONSULTATION DATE:  2/23  REFERRING MD :  Sheliah Plane [place])   CHIEF COMPLAINT:  COPD   BRIEF PATIENT DESCRIPTION: 70yo female former smoker (quit 2016) with hx Afib, COPD on 3L home O2 and maintenance prednisone ('5mg'$ /day), recent dx NSCLC (squamous cell) currently undergoing radiation therapy with multiple recent admits (just d/c 2/21 after admit for AFib with RVR).  Presented 2/23 with 1 day hx progressive SOB, cough.  Admitted by [facility] with AECOPD.  PCCM consulted for assist given multiple recent admits.    SIGNIFICANT EVENTS    STUDIES:  PFT's 10/2012 show FEV1/FVC = 71% predicted, FEV1 = 49% predicted, DLCO 39% predicted CT 06/2014 no ILD, resolved nodular ASD CT chest 02/2015 Lingular consolidation, compression fractures T5 and T6   HISTORY OF PRESENT ILLNESS:   70yo female former smoker (quit 2016) with hx Afib, COPD on 3L home O2 and maintenance prednisone ('5mg'$ /day), recent dx NSCLC (squamous cell) currently undergoing radiation therapy with multiple recent admits (just d/c 2/21 after admit for AFib with RVR).  Presented 2/23 with 1 day hx progressive SOB, cough.  Admitted by [facility] with AECOPD.  PCCM consulted for assist given multiple recent admits.   She denies fever but she does have a cough. She says that the biggest problem she's been experiencing recently his severe fatigue and just profound dyspnea since starting radiation therapy. She says "there is not enough air in the year". She has yet to make a decision regarding CODE STATUS  PAST MEDICAL HISTORY :   has a past medical history of Atrial fibrillation ([place]); Hypertension; Insomnia; Nonischemic cardiomyopathy ([place]); Hyperlipidemia; Anxiety; Cholelithiasis; Insomnia; Mitral regurgitation; H/O epistaxis; Rhinitis, allergic; CHF (congestive heart failure) ([place]); Pneumonia; COPD (chronic obstructive pulmonary disease) ([place]); Chronic bronchitis  with COPD (chronic obstructive pulmonary disease) ([place]); Shortness of breath; GERD (gastroesophageal reflux disease); Headache(784.0); Migraines; DJD (degenerative joint disease); Arthritis; OCD (obsessive compulsive disorder); OCD (obsessive compulsive disorder); Depression; Thoracic vertebral fracture ([place]) (11/03/2014); and Primary lung cancer ([place]).  has past surgical history that includes Tubal ligation (1972); Cardioversion (2003; 07/2003); and Video bronchoscopy (Bilateral, 02/27/2015). Prior to Admission medications   Medication Sig Start Date End Date Taking? Authorizing Provider  albuterol (PROVENTIL) (2.5 MG/3ML) 0.083% nebulizer solution Take 3 mLs (2.5 mg total) by nebulization every 6 (six) hours as needed for wheezing. 09/21/14  Yes Maryann Mikhail, DO  aspirin 325 MG EC tablet Take 650 mg by mouth daily with breakfast.    Yes Historical Provider, MD  busPIRone (BUSPAR) 10 MG tablet Take 10 mg by mouth 3 (three) times daily. 04/22/14  Yes Historical Provider, MD  diazepam (VALIUM) 2 MG tablet Take 1 tablet (2 mg total) by mouth every 6 (six) hours as needed for anxiety. 03/05/15  Yes Donne Hazel, MD  diltiazem (CARDIZEM) 120 MG tablet Take 1 tablet (120 mg total) by mouth every 8 (eight) hours. 03/12/15  Yes Rigoberto Noel, MD  FLUoxetine (PROZAC) 20 MG capsule TAKE THREE CAPSULES BY MOUTH ONCE DAILY Patient taking differently: Take 60 mg by mouth daily.  01/03/14  Yes Tasrif Ahmed, MD  furosemide (LASIX) 20 MG tablet Take 40 mg by mouth daily.    Yes Historical Provider, MD  guaiFENesin (MUCINEX) 600 MG 12 hr tablet Take 600 mg by mouth 2 (two) times daily as needed for cough.   Yes Historical Provider, MD  HYDROcodone-acetaminophen (NORCO) 10-325 MG tablet Take 1 tablet by mouth  every 6 (six) hours as needed for moderate pain (Pain or Dyspnea). 04/02/15  Yes Theodis Blaze, MD  HYDROcodone-homatropine [facility] 5-1.5 MG/5ML syrup Take 2.5 mLs by mouth 2 (two) times daily as needed for cough.  03/05/15  Yes Donne Hazel, MD  ipratropium-albuterol (DUONEB) 0.5-2.5 (3) MG/3ML SOLN Take 3 mLs by nebulization 3 (three) times daily. 03/07/15  Yes Tammy S Parrett, NP  metoprolol succinate (TOPROL-XL) 25 MG 24 hr tablet Take 1 tablet (25 mg total) by mouth daily. 02/14/15  Yes Brittainy Erie Noe, PA-C  NON FORMULARY Place 1 drop into both eyes 2 (two) times daily as needed (hurting, dry). thera- tears   Yes Historical Provider, MD  pantoprazole (PROTONIX) 40 MG tablet TAKE ONE TABLET BY MOUTH TWICE DAILY 01/03/14  Yes Tasrif Ahmed, MD  SPIRIVA HANDIHALER 18 MCG inhalation capsule PLACE ONE CAPSULE INTO INHALER AND INHALE DAILY 03/07/15  Yes Rigoberto Noel, MD  SYMBICORT 160-4.5 MCG/ACT inhaler INHALE TWO PUFFS BY MOUTH TWICE DAILY 02/06/15  Yes Rigoberto Noel, MD  VENTOLIN HFA 108 (90 Base) MCG/ACT inhaler INHALE ONE TO TWO PUFFS BY MOUTH EVERY 6 HOURS AS NEEDED FOR WHEEZING 03/07/15  Yes Rigoberto Noel, MD  Wound Dressings (SONAFINE) Apply 1 application topically 3 (three) times daily as needed (for radiation).    Yes Historical Provider, MD  predniSONE (DELTASONE) 5 MG tablet Take 1 tablet (5 mg total) by mouth daily with breakfast. Patient not taking: Reported on 04/04/2015 03/06/15   Donne Hazel, MD   Allergies  Allergen Reactions  . Ace Inhibitors Cough  . Codeine Swelling    Face and lips swelling  Patient report Can tolerate hydrocodone  . Pseudoeph-Doxylamine-Dm-Apap Itching  . Diphenhydramine Hcl Other (See Comments)    "feels like my skin is crawling, and legs twitches"  . Erythromycin Diarrhea  . Nsaids Nausea And Vomiting  . Nyquil [Pseudoeph-Doxylamine-Dm-Apap] Itching  . Tramadol Hcl Other (See Comments)    stomach pain, hallucinations  . Fosamax [Alendronate] Other (See Comments)    Nausea and abdominal bloating.   . Tomato Other (See Comments)    Stomach swells  . Orange Fruit [Citrus] Rash  . Pseudoephedrine Palpitations    FAMILY HISTORY:  family history includes  COPD in her mother; Cirrhosis in her father; Emphysema in her mother. SOCIAL HISTORY:  reports that she quit smoking about 9 months ago. Her smoking use included Cigarettes. She has a 35 pack-year smoking history. She has never used smokeless tobacco. She reports that she does not drink alcohol or use illicit drugs.  REVIEW OF SYSTEMS:   As per HPI - All other systems reviewed and were neg.    SUBJECTIVE:   VITAL SIGNS: Temp:  [97.8 F (36.6 C)-98.5 F (36.9 C)] 97.8 F (36.6 C) (02/23 1611) Pulse Rate:  [98-131] 116 (02/23 1611) Resp:  [15-34] 22 (02/23 1611) BP: (119-207)/(70-152) 119/74 mmHg (02/23 1611) SpO2:  [90 %-100 %] 98 % (02/23 1611) Weight:  [83.915 kg (185 lb)] 83.915 kg (185 lb) (02/23 0804)  PHYSICAL EXAMINATION: General:  Frail, chronically ill-appearing, tachypnea Neuro:  Awake alert, but somewhat inattentive, tangential speech HEENT:  Normocephalic atraumatic, extraocular movements intact Cardiovascular:  Regular rate and rhythm, no murmurs gallops or rubs Lungs:  Diminished air movement, a few crackles bases, no clear wheezing no accessory muscle use Abdomen:  Bowel sounds positive, nontender nondistended Musculoskeletal:  Normal bulk and tone Skin:  No rash or skin breakdown   Recent Labs Lab 04/01/15 0503 04/02/15  0459 04/04/15 0858  NA 145 145 144  K 4.2 3.9 3.4*  CL 108 102 103  CO2 33* 36* 33*  BUN 22* 20 14  CREATININE 0.70 0.62 0.44  GLUCOSE 110* 113* 120*    Recent Labs Lab 04/01/15 0503 04/02/15 0459 04/04/15 0858  HGB 9.5* 9.6* 10.2*  HCT 32.3* 33.2* 34.9*  WBC 5.5 5.3 5.2  PLT 273 269 222   Dg Chest 2 View  04/04/2015  CLINICAL DATA:  Worsening cough, congestion and shortness of breath. EXAM: CHEST  2 VIEW COMPARISON:  03/30/2015 FINDINGS: Chronic elevation of the right hemidiaphragm. No focal airspace disease or pulmonary edema. Heart and mediastinum are stable and within normal limits. No large pleural effusions. Again noted are  compression deformities in the mid thoracic spine. IMPRESSION: No active cardiopulmonary disease. Electronically Signed   By: Markus Daft M.D.   On: 04/04/2015 08:54    ASSESSMENT / PLAN:  Chronic respiratory failure - frequent exacerbations likely multifactorial in setting mod/severe COPD, Afib with RVR and now NSCLC with radiation therapy.  Based on my exam today I do not hear evidence of wheezing. She does not appear to have evidence of pneumonia or congestive heart failure. I worry that her symptoms are more chronic than acute. I agree with treating her for an acute exacerbation of COPD but I'm not sure that that is what is going on.   AECOPD  NSCLC  AFib with RVR   PLAN -  Continue IV steroids  Pulmonary hygiene  BD's - xopenex, I will add proton and Pulmicort, continue Spiriva Cont xrt while inpt  HR control - cardizem gtt if needed  Supplemental O2  May need to consider SNF on d/c  Consider palliative care consult, I would strongly recommend this as I think that much of her symptoms are chronic. I think that she could benefit from morphine for relief from dyspnea.  Roselie Awkward, MD [place] PCCM Pager: [phone] Cell: [phone] After 3pm or if no response, call [phone]

## 2015-04-04 NOTE — Progress Notes (Signed)
Pt confirms pcp as alison ervin at [facility]  Pt provided a sprite after consulting with ED RN Pt very pleasant Noted she felt better when sitting up in bed  O2 [place] 2 L intact Pt able to completed full sentences

## 2015-04-04 NOTE — ED Provider Notes (Signed)
CSN: [id number]     Arrival date & time 04/04/15  6789 History   First MD Initiated Contact with Patient 04/04/15 [phone]     Chief Complaint  Patient presents with  . Shortness of Breath     (Consider location/radiation/quality/duration/timing/severity/associated sxs/prior Treatment) Patient is a 70 y.o. female presenting with shortness of breath. The history is provided by the patient (Patient complains of shortness breath and wheezing. She has a history of COPD and A. fib).  Shortness of Breath Severity:  Moderate Onset quality:  Sudden Timing:  Constant Progression:  Worsening Chronicity:  Recurrent Context: activity   Associated symptoms: wheezing   Associated symptoms: no abdominal pain, no chest pain, no cough, no headaches and no rash     Past Medical History  Diagnosis Date  . Atrial fibrillation ([place])   . Hypertension   . Insomnia   . Nonischemic cardiomyopathy ([place])   . Hyperlipidemia   . Anxiety   . Cholelithiasis   . Insomnia   . Mitral regurgitation     noted 2010  . H/O epistaxis   . Rhinitis, allergic   . CHF (congestive heart failure) ([place])   . Pneumonia     "several times w/exacerbations of the COPD; nothing in the last year" (07/15/2012)  . COPD (chronic obstructive pulmonary disease) ([place])     as of 7/13 on 2-3L, pfts 10/2008 with mod obstruction  . Chronic bronchitis with COPD (chronic obstructive pulmonary disease) ([place])   . Shortness of breath     "all the time right now" (07/15/2012)  . GERD (gastroesophageal reflux disease)   . FBPZWCHE(527.7)     "weekly" (07/15/2012)  . Migraines     "weekly for awhile; cleared up as I got older" (07/15/2012)  . DJD (degenerative joint disease)   . Arthritis     "all over" (07/15/2012)  . OCD (obsessive compulsive disorder)   . OCD (obsessive compulsive disorder)   . Depression     h/o SI; "last time I was really serious about it was ~ 1997" (07/15/2012)  . Thoracic vertebral fracture ([place]) 11/03/2014  . Primary lung  cancer [facility])    Past Surgical History  Procedure Laterality Date  . Tubal ligation  1972  . Cardioversion  2003; 07/2003  . Video bronchoscopy Bilateral 02/27/2015    Procedure: VIDEO BRONCHOSCOPY WITH FLUORO;  Surgeon: Chesley Mires, MD;  Location: [facility];  Service: Cardiopulmonary;  Laterality: Bilateral;   Family History  Problem Relation Age of Onset  . Asthma    . Emphysema    . Allergies    . Cancer      aunt had several types of cancer  . COPD Mother   . Emphysema Mother   . Cirrhosis Father    Social History  Substance Use Topics  . Smoking status: Former Smoker -- 1.00 packs/day for 35 years    Types: Cigarettes    Quit date: 06/18/2014  . Smokeless tobacco: Never Used  . Alcohol Use: No   OB History    No data available     Review of Systems  Constitutional: Negative for appetite change and fatigue.  HENT: Negative for congestion, ear discharge and sinus pressure.   Eyes: Negative for discharge.  Respiratory: Positive for shortness of breath and wheezing. Negative for cough.   Cardiovascular: Negative for chest pain.  Gastrointestinal: Negative for abdominal pain and diarrhea.  Genitourinary: Negative for frequency and hematuria.  Musculoskeletal: Negative for back pain.  Skin: Negative for rash.  Neurological: Negative for seizures and headaches.  Psychiatric/Behavioral: Negative for hallucinations.      Allergies  Ace inhibitors; Codeine; Pseudoeph-doxylamine-dm-apap; Diphenhydramine hcl; Erythromycin; Nsaids; Nyquil; Tramadol hcl; Fosamax; Tomato; Orange fruit; and Pseudoephedrine  Home Medications   Prior to Admission medications   Medication Sig Start Date End Date Taking? Authorizing Provider  albuterol (PROVENTIL) (2.5 MG/3ML) 0.083% nebulizer solution Take 3 mLs (2.5 mg total) by nebulization every 6 (six) hours as needed for wheezing. 09/21/14  Yes Maryann Mikhail, DO  aspirin 325 MG EC tablet Take 650 mg by mouth daily with breakfast.     Yes Historical Provider, MD  busPIRone (BUSPAR) 10 MG tablet Take 10 mg by mouth 3 (three) times daily. 04/22/14  Yes Historical Provider, MD  diazepam (VALIUM) 2 MG tablet Take 1 tablet (2 mg total) by mouth every 6 (six) hours as needed for anxiety. 03/05/15  Yes Donne Hazel, MD  diltiazem (CARDIZEM) 120 MG tablet Take 1 tablet (120 mg total) by mouth every 8 (eight) hours. 03/12/15  Yes Rigoberto Noel, MD  FLUoxetine (PROZAC) 20 MG capsule TAKE THREE CAPSULES BY MOUTH ONCE DAILY Patient taking differently: Take 60 mg by mouth daily.  01/03/14  Yes Tasrif Ahmed, MD  furosemide (LASIX) 20 MG tablet Take 40 mg by mouth daily.    Yes Historical Provider, MD  guaiFENesin (MUCINEX) 600 MG 12 hr tablet Take 600 mg by mouth 2 (two) times daily as needed for cough.   Yes Historical Provider, MD  HYDROcodone-acetaminophen (NORCO) 10-325 MG tablet Take 1 tablet by mouth every 6 (six) hours as needed for moderate pain (Pain or Dyspnea). 04/02/15  Yes Theodis Blaze, MD  HYDROcodone-homatropine [facility]) 5-1.5 MG/5ML syrup Take 2.5 mLs by mouth 2 (two) times daily as needed for cough. 03/05/15  Yes Donne Hazel, MD  ipratropium-albuterol (DUONEB) 0.5-2.5 (3) MG/3ML SOLN Take 3 mLs by nebulization 3 (three) times daily. 03/07/15  Yes Tammy S Parrett, NP  metoprolol succinate (TOPROL-XL) 25 MG 24 hr tablet Take 1 tablet (25 mg total) by mouth daily. 02/14/15  Yes Brittainy Erie Noe, PA-C  NON FORMULARY Place 1 drop into both eyes 2 (two) times daily as needed (hurting, dry). thera- tears   Yes Historical Provider, MD  pantoprazole (PROTONIX) 40 MG tablet TAKE ONE TABLET BY MOUTH TWICE DAILY 01/03/14  Yes Tasrif Ahmed, MD  SPIRIVA HANDIHALER 18 MCG inhalation capsule PLACE ONE CAPSULE INTO INHALER AND INHALE DAILY 03/07/15  Yes Rigoberto Noel, MD  SYMBICORT 160-4.5 MCG/ACT inhaler INHALE TWO PUFFS BY MOUTH TWICE DAILY 02/06/15  Yes Rigoberto Noel, MD  VENTOLIN HFA 108 (90 Base) MCG/ACT inhaler INHALE ONE TO TWO PUFFS BY  MOUTH EVERY 6 HOURS AS NEEDED FOR WHEEZING 03/07/15  Yes Rigoberto Noel, MD  Wound Dressings (SONAFINE) Apply 1 application topically 3 (three) times daily as needed (for radiation).    Yes Historical Provider, MD  predniSONE (DELTASONE) 5 MG tablet Take 1 tablet (5 mg total) by mouth daily with breakfast. Patient not taking: Reported on 04/04/2015 03/06/15   Donne Hazel, MD   BP 180/139 mmHg  Pulse 124  Temp(Src) 98.5 F (36.9 C) (Oral)  Resp 21  Ht '5\' 5"'$  (1.651 m)  Wt 185 lb (83.915 kg)  BMI 30.79 kg/m2  SpO2 99% Physical Exam  Constitutional: She is oriented to person, place, and time. She appears well-developed.  HENT:  Head: Normocephalic.  Eyes: Conjunctivae and EOM are normal. No scleral icterus.  Neck: Neck supple.  No thyromegaly present.  Cardiovascular: Exam reveals no gallop and no friction rub.   No murmur heard. Irregular rapid rate  Pulmonary/Chest: No stridor. She has wheezes. She has no rales. She exhibits no tenderness.  Abdominal: She exhibits no distension. There is no tenderness. There is no rebound.  Musculoskeletal: Normal range of motion. She exhibits no edema.  Lymphadenopathy:    She has no cervical adenopathy.  Neurological: She is oriented to person, place, and time. She exhibits normal muscle tone. Coordination normal.  Skin: No rash noted. No erythema.  Psychiatric: She has a normal mood and affect. Her behavior is normal.    ED Course  Procedures (including critical care time) Labs Review Labs Reviewed  CBC WITH DIFFERENTIAL/PLATELET - Abnormal; Notable for the following:    RBC 3.51 (*)    Hemoglobin 10.2 (*)    HCT 34.9 (*)    MCHC 29.2 (*)    RDW 16.5 (*)    All other components within normal limits  COMPREHENSIVE METABOLIC PANEL - Abnormal; Notable for the following:    Potassium 3.4 (*)    CO2 33 (*)    Glucose, Bld 120 (*)    Calcium 8.6 (*)    Total Protein 5.7 (*)    Albumin 3.2 (*)    All other components within normal limits   I-STAT CG4 LACTIC ACID, ED  I-STAT CG4 LACTIC ACID, ED  Randolm Idol, ED    Imaging Review Dg Chest 2 View  04/04/2015  CLINICAL DATA:  Worsening cough, congestion and shortness of breath. EXAM: CHEST  2 VIEW COMPARISON:  03/30/2015 FINDINGS: Chronic elevation of the right hemidiaphragm. No focal airspace disease or pulmonary edema. Heart and mediastinum are stable and within normal limits. No large pleural effusions. Again noted are compression deformities in the mid thoracic spine. IMPRESSION: No active cardiopulmonary disease. Electronically Signed   By: Markus Daft M.D.   On: 04/04/2015 08:54   I have personally reviewed and evaluated these images and lab results as part of my medical decision-making.   EKG Interpretation None      MDM   Final diagnoses:  COPD exacerbation ([place])  Atrial fibrillation with RVR ([place])   COPD exacerbation with rapid A. fib  Milton Ferguson, MD 04/04/15 1332

## 2015-04-04 NOTE — Progress Notes (Signed)
Entered in d/c instructions Darden Amber Schedule an appointment as soon as possible for a visit As needed This is your assigned Medicaid [place] access doctor If you prefer another contact [place] assigned your doctor *You may receive a bill if you go to any family Dr not assigned to you [place] [address] [place] access covered patient Kathleen [place]: [place], [place] [address] [email or web address] Use this website to assist with understanding your coverage & to renew application As a Medicaid client you MUST contact DSS/SSI each time you change address, move to another [place] or another state to keep your address updated  [place] Medicaid Transportation to Dr appts if you are have full Medicaid: [phone], [phone]

## 2015-04-04 NOTE — ED Notes (Signed)
Patient was discharged from the hospital yesterday after a 4 day visit.

## 2015-04-04 NOTE — Telephone Encounter (Signed)
Ms. Vanwagoner did not come for her radiation appointment today. I called the patient's phone and left a voice mail to return my call when she was able. I then called and spoke to her daughter, Levada Dy. She stated that Ms. Wyffels is in the [facility] Emergency Department at this time complaining of a cough which started last night. Levada Dy reports there is a possibility she will be admitted. I have called [facility] to inform them of Ms. Zwahlen's location. They have cancelled her radiation appointment for today, and will check on her tomorrow.

## 2015-04-04 NOTE — Progress Notes (Signed)
Received pt from ED, alert and oriented, VS obtained, oriented to unit, call light in reach

## 2015-04-04 NOTE — Progress Notes (Signed)
Left voice message for medical director

## 2015-04-05 ENCOUNTER — Ambulatory Visit: Payer: Medicare Other | Admitting: Radiation Oncology

## 2015-04-05 ENCOUNTER — Telehealth: Payer: Self-pay | Admitting: Oncology

## 2015-04-05 ENCOUNTER — Ambulatory Visit
Admission: RE | Admit: 2015-04-05 | Discharge: 2015-04-05 | Disposition: A | Discharge status: Home / Self Care | Payer: Medicare Other | Source: Ambulatory Visit | Attending: Radiation Oncology | Admitting: Radiation Oncology

## 2015-04-05 LAB — BASIC METABOLIC PANEL
Anion gap: 7 (ref 5–15)
BUN: 17 mg/dL (ref 6–20)
CALCIUM: 9.1 mg/dL (ref 8.9–10.3)
CHLORIDE: 100 mmol/L — AB (ref 101–111)
CO2: 32 mmol/L (ref 22–32)
CREATININE: 0.49 mg/dL (ref 0.44–1.00)
GFR calc non Af Amer: >60 mL/min (ref >60)
Glucose, Bld: 163 mg/dL — ABNORMAL HIGH (ref 65–99)
Potassium: 4.3 mmol/L (ref 3.5–5.1)
SODIUM: 139 mmol/L (ref 135–145)

## 2015-04-05 LAB — CBC
HCT: 32.2 % — ABNORMAL LOW (ref 36.0–46.0)
Hemoglobin: 9.7 g/dL — ABNORMAL LOW (ref 12.0–15.0)
MCH: 29.5 pg (ref 26.0–34.0)
MCHC: 30.1 g/dL (ref 30.0–36.0)
MCV: 97.9 fL (ref 78.0–100.0)
PLATELETS: 225 10*3/uL (ref 150–400)
RBC: 3.29 MIL/uL — AB (ref 3.87–5.11)
RDW: 16.1 % — AB (ref 11.5–15.5)
WBC: 5.3 10*3/uL (ref 4.0–10.5)

## 2015-04-05 LAB — GLUCOSE, CAPILLARY: GLUCOSE-CAPILLARY: 152 mg/dL — AB (ref 65–99)

## 2015-04-05 MED ORDER — LORAZEPAM 1 MG PO TABS
1.0000 mg | ORAL_TABLET | Freq: Once | ORAL | Status: AC
  Administered 2015-04-05: 1 mg via ORAL
  Filled 2015-04-05: qty 1

## 2015-04-05 MED ORDER — LEVALBUTEROL HCL 0.63 MG/3ML IN NEBU
0.6300 mg | INHALATION_SOLUTION | Freq: Three times a day (TID) | RESPIRATORY_TRACT | Status: DC
  Administered 2015-04-06 – 2015-04-07 (×3): 0.63 mg via RESPIRATORY_TRACT
  Filled 2015-04-05 (×5): qty 3

## 2015-04-05 MED ORDER — BUDESONIDE 0.5 MG/2ML IN SUSP
0.5000 mg | Freq: Two times a day (BID) | RESPIRATORY_TRACT | Status: DC
Start: 2015-04-05 — End: 2015-04-07
  Administered 2015-04-05 – 2015-04-07 (×3): 0.5 mg via RESPIRATORY_TRACT
  Filled 2015-04-05 (×3): qty 2

## 2015-04-05 MED ORDER — ARFORMOTEROL TARTRATE 15 MCG/2ML IN NEBU
15.0000 ug | INHALATION_SOLUTION | Freq: Two times a day (BID) | RESPIRATORY_TRACT | Status: DC
  Administered 2015-04-05 – 2015-04-07 (×3): 15 ug via RESPIRATORY_TRACT
  Filled 2015-04-05 (×3): qty 2

## 2015-04-05 MED ORDER — LEVOFLOXACIN 750 MG PO TABS
750.0000 mg | ORAL_TABLET | ORAL | Status: DC
  Administered 2015-04-05 – 2015-04-06 (×2): 750 mg via ORAL
  Filled 2015-04-05 (×2): qty 1

## 2015-04-05 NOTE — Clinical Social Work Placement (Signed)
Patient has a bed at [facility]. CSW has completed FL2 & will continue to follow and assist with discharge when ready.   CSW confirmed with Kelsey Garcia at [facility] that they would be able to take patient over the weekend if ready.   Please call weekend CSW (ph#: [phone]) to facilitate discharge.    Raynaldo Opitz, [facility] Clinical Social Worker cell #: [phone]     CLINICAL SOCIAL WORK PLACEMENT  NOTE  Date:  04/05/2015  Patient Details  Name: Kelsey Garcia MRN: [id number] Date of Birth: Jul 18, 1945  Clinical Social Work is seeking post-discharge placement for this patient at the [place] level of care (*CSW will initial, date and re-position this form in  chart as items are completed):  Yes   Patient/family provided with [place] Work Department's list of facilities offering this level of care within the geographic area requested by the patient (or if unable, by the patient's family).  Yes   Patient/family informed of their freedom to choose among providers that offer the needed level of care, that participate in Medicare, Medicaid or managed care program needed by the patient, have an available bed and are willing to accept the patient.  Yes   Patient/family informed of [place]'s ownership interest in [facility] and [facility], as well as of the fact that they are under no obligation to receive care at these facilities.  PASRR submitted to EDS on 04/05/15     PASRR number received on 04/05/15     Existing PASRR number confirmed on       FL2 transmitted to all facilities in geographic area requested by pt/family on 04/05/15     FL2 transmitted to all facilities within larger geographic area on       Patient informed that his/her managed care company has contracts with or will negotiate with certain facilities, including the following:        Yes   Patient/family informed of bed offers  received.  Patient chooses bed at [facility]     Physician recommends and patient chooses bed at      Patient to be transferred to [facility] on  .  Patient to be transferred to facility by       Patient family notified on   of transfer.  Name of family member notified:        PHYSICIAN       Additional Comment:    _______________________________________________ Standley Brooking, LCSW 04/05/2015, 3:39 PM

## 2015-04-05 NOTE — NC FL2 (Signed)
[place] LEVEL OF CARE SCREENING TOOL     IDENTIFICATION  Patient Name: Kelsey Garcia Birthdate: 1945-02-17 Sex: female Admission Date (Current Location): 04/04/2015  [facility] and [place] Number:  Herbalist and Address:  [facility],  [address] [place], [place]      Provider Number: [id number]  Attending Physician Name and Address:  Geradine Girt, DO  Relative Name and Phone Number:       Current Level of Care: Hospital Recommended Level of Care: [place] Prior Approval Number:    Date Approved/Denied:   PASRR Number: [id number]  Discharge Plan: Home    Current Diagnoses: Patient Active Problem List   Diagnosis Date Noted  . Acute exacerbation of chronic obstructive pulmonary disease (COPD) ([place]) 04/04/2015  . Chronic fatigue   . Cancer of lingula of lung ([place]) 03/04/2015  . Squamous cell lung cancer ([place])   . Palliative care encounter   . Pain in joint, pelvic region and thigh   . Anxiety state   . S/P bronchoscopy with biopsy   . Primary lung cancer ([place])   . Lingular pneumonia   . Chronic obstructive pulmonary disease with acute exacerbation ([place])   . Chronic diastolic (congestive) heart failure ([place]) 02/23/2015  . Atrial fibrillation with RVR ([place]) 01/30/2015  . Thoracic vertebral fracture ([place]) 11/03/2014  . Acute on chronic diastolic heart failure ([place])   . Major depressive disorder, recurrent, in full remission with anxious distress ([place]) 07/23/2014  . Narcotic addiction ([place]) 07/12/2014  . Narcotic dependence ([place]) 07/12/2014  . Generalized abdominal pain 07/05/2014  . Chronic pain syndrome 07/02/2014  . COPD exacerbation ([place]) 06/19/2014  . Atrial fibrillation ([place]) 06/19/2014  . Pancytopenia ([place]) 05/15/2014  . Fracture of rib of left side with delayed healing 05/15/2014  . Respiratory failure, chronic ([place]) 12/20/2013  . Neck pain on right side 12/18/2013  . Chronic diastolic CHF  (congestive heart failure) ([place]) 10/12/2013  . Osteoporosis 11/29/2012  . Easy bruising 10/01/2012  . Oral thrush 09/24/2012  . Other screening mammogram 12/25/2011  . Screening for colon cancer 12/25/2011  . Impaired mobility 11/26/2011  . 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 Long term current use of opiate analgesic 10/30/2011  . GERD (gastroesophageal reflux disease) 10/30/2011  . Chronic cough 08/28/2011  . Elevated MCV 08/28/2011  . Financial difficulties 04/22/2011  . Preventative health care 10/30/2010  . Anxiety 10/30/2010  . HLD (hyperlipidemia) 05/24/2009  . NICOTINE ADDICTION 01/07/2009  . Osteoarthritis 11/28/2008  . COPD (chronic obstructive pulmonary disease) ([place]) 03/28/2008  . OBSESSIVE-COMPULSIVE DISORDER 12/29/2005  . Depression with anxiety 12/29/2005  . Essential hypertension 12/29/2005  . CARDIOMYOPATHY 12/29/2005  . Atrial fibrillation with rapid ventricular response ([place]) 12/29/2005  . CHOLELITHIASIS 12/29/2005  . INSOMNIA 12/29/2005    Orientation RESPIRATION BLADDER Height & Weight     Self, Time, Situation, Place  O2 (2L) Continent Weight: 168 lb 12.8 oz (76.567 kg) Height:  '5\' 5"'$  (165.1 cm)  BEHAVIORAL SYMPTOMS/MOOD NEUROLOGICAL BOWEL NUTRITION STATUS      Continent Diet (Heart)  AMBULATORY STATUS COMMUNICATION OF NEEDS Skin   Limited Assist Verbally Normal                       Personal Care Assistance Level of Assistance  Bathing, Dressing Bathing Assistance: Limited assistance   Dressing Assistance: Limited assistance     Functional Limitations Info  SPECIAL CARE FACTORS FREQUENCY  PT (By licensed PT), OT (By licensed OT)     PT Frequency: 5 OT Frequency: 5            Contractures      Additional Factors Info  Code Status, Allergies Code Status Info: Fullcode Allergies Info: Ace Inhibitors, Codeine, Pseudoeph-doxylamine-dm-apap, Diphenhydramine Hcl, Erythromycin, Nsaids, Nyquil, Tramadol Hcl, Fosamax,  Tomato, Orange Fruit, Pseudoephedrine           Current Medications (04/05/2015):  This is the current hospital active medication list Current Facility-Administered Medications  Medication Dose Route Frequency Provider Last Rate Last Dose  . 0.9 %  sodium chloride infusion  250 mL Intravenous PRN Bonnielee Haff, MD      . acetaminophen (TYLENOL) tablet 650 mg  650 mg Oral Q6H PRN Bonnielee Haff, MD       Or  . acetaminophen (TYLENOL) suppository 650 mg  650 mg Rectal Q6H PRN Bonnielee Haff, MD      . antiseptic oral rinse (CPC / CETYLPYRIDINIUM CHLORIDE 0.05%) solution 7 mL  7 mL Mouth Rinse q12n4p Bonnielee Haff, MD   7 mL at 04/05/15 1252  . arformoterol (BROVANA) nebulizer solution 15 mcg  15 mcg Nebulization BID Juanito Doom, MD      . aspirin EC tablet 650 mg  650 mg Oral Q breakfast Bonnielee Haff, MD   650 mg at 04/05/15 0950  . budesonide (PULMICORT) nebulizer solution 0.5 mg  0.5 mg Nebulization BID Juanito Doom, MD      . busPIRone (BUSPAR) tablet 10 mg  10 mg Oral TID Bonnielee Haff, MD   10 mg at 04/05/15 0950  . chlorhexidine (PERIDEX) 0.12 % solution 15 mL  15 mL Mouth Rinse BID Bonnielee Haff, MD   15 mL at 04/05/15 0950  . diazepam (VALIUM) tablet 2 mg  2 mg Oral Q6H PRN Bonnielee Haff, MD   2 mg at 04/05/15 1004  . diltiazem (CARDIZEM) tablet 120 mg  120 mg Oral 3 times per day Bonnielee Haff, MD   120 mg at 04/05/15 1452  . docusate sodium (COLACE) capsule 100 mg  100 mg Oral BID Bonnielee Haff, MD   100 mg at 04/05/15 0951  . enoxaparin (LOVENOX) injection 40 mg  40 mg Subcutaneous Q24H Bonnielee Haff, MD   40 mg at 04/04/15 1706  . FLUoxetine (PROZAC) capsule 60 mg  60 mg Oral Daily Bonnielee Haff, MD   60 mg at 04/05/15 0951  . furosemide (LASIX) tablet 40 mg  40 mg Oral Daily Bonnielee Haff, MD   40 mg at 04/05/15 1452  . HYDROcodone-acetaminophen (NORCO) 10-325 MG per tablet 1 tablet  1 tablet Oral Q6H PRN Bonnielee Haff, MD   1 tablet at 04/04/15 2235  .  HYDROcodone-homatropine (HYCODAN) 5-1.5 MG/5ML syrup 2.5 mL  2.5 mL Oral BID PRN Bonnielee Haff, MD   2.5 mL at 04/05/15 0556  . levalbuterol (XOPENEX) nebulizer solution 0.63 mg  0.63 mg Nebulization Q6H PRN Bonnielee Haff, MD      . levalbuterol Penne Lash) nebulizer solution 0.63 mg  0.63 mg Nebulization TID Bonnielee Haff, MD   0.63 mg at 04/05/15 0748  . levofloxacin (LEVAQUIN) tablet 750 mg  750 mg Oral Q24H Jessica U Vann, DO      . methylPREDNISolone sodium succinate (SOLU-MEDROL) 125 mg/2 mL injection 60 mg  60 mg Intravenous 3 times per day Bonnielee Haff, MD   60 mg at 04/05/15 1452  . metoprolol succinate (TOPROL-XL) 24 hr tablet  25 mg  25 mg Oral Daily Bonnielee Haff, MD   25 mg at 04/05/15 0950  . ondansetron (ZOFRAN) tablet 4 mg  4 mg Oral Q6H PRN Bonnielee Haff, MD       Or  . ondansetron (ZOFRAN) injection 4 mg  4 mg Intravenous Q6H PRN Bonnielee Haff, MD      . pantoprazole (PROTONIX) EC tablet 40 mg  40 mg Oral BID Bonnielee Haff, MD   40 mg at 04/05/15 0950  . sodium chloride flush (NS) 0.9 % injection 3 mL  3 mL Intravenous Q12H Bonnielee Haff, MD   0 mL at 04/04/15 2244  . sodium chloride flush (NS) 0.9 % injection 3 mL  3 mL Intravenous Q12H Bonnielee Haff, MD   3 mL at 04/04/15 2244  . sodium chloride flush (NS) 0.9 % injection 3 mL  3 mL Intravenous PRN Bonnielee Haff, MD      . tiotropium [facility]) inhalation capsule 18 mcg  18 mcg Inhalation Daily Bonnielee Haff, MD   18 mcg at 04/04/15 1752     Discharge Medications: Please see discharge summary for a list of discharge medications.  Relevant Imaging Results:  Relevant Lab Results:   Additional Information SSN: [id number]  Standley Brooking, LCSW

## 2015-04-05 NOTE — Care Management Note (Signed)
Case Management Note  Patient Details  Name: Kelsey Garcia MRN: [id number] Date of Birth: 11-12-1945  Subjective/Objective:      Pt re-admitted with SOB              Action/Plan:  Pt states that she will go to SNF if she can.  If pt returns home with [facility] will need HHRN/PT/NA/SW orders.  Expected Discharge Date:   (unknown)               Expected Discharge Plan:  [place]  In-House Referral:  Clinical Social Work  Discharge planning Services  CM Consult  Post Acute Care Choice:    Choice offered to:  Patient  DME Arranged:    DME Agency:     HH Arranged:  RN, PT, Nurse's Aide [place] Agency:  [place]  Status of Service:  In process, will continue to follow  Medicare Important Message Given:    Date Medicare IM Given:    Medicare IM give by:    Date Additional Medicare IM Given:    Additional Medicare Important Message give by:     If discussed at [place] of Stay Meetings, dates discussed:    Additional CommentsPurcell Mouton, RN 04/05/2015, 4:05 PM

## 2015-04-05 NOTE — Telephone Encounter (Addendum)
Mal Amabile, RN on [place] to see if patient is able to have treatment today.  Stanton Kidney said that she is able to have treatment and also asked [place] who said she would like treatment.  Notified Heather, RT on [place] 1.

## 2015-04-05 NOTE — Progress Notes (Signed)
PROGRESS NOTE  LILIANNE DELAIR OXB:[id number] DOB: 07/14/1945 DOA: 04/04/2015 PCP: Darden Amber, PA  Assessment/Plan: acute COPD exacerbation with acute respiratory failure: -chronic respiratory failure and is on 2 L of oxygen by nasal cannula at home -steroids.  -Nebulizer treatments - antibiotics.- on levaquin which will treat recent sputum cx + for pseudomonas -pulm has been consulted  atrial fibrillation with RVR:  -back on her home medication regimen. -telemetry. -Patient is not on anticoagulation due to personal preference. She takes aspirin daily. The anticoagulation issue has been discussed with the patient multiple times by her cardiologist. Chads 2 vascular score is 4.  Squamous cell lung cancer: She is receiving radiation treatment. -treatment today- 1 mg ativan prior to treatment   history of essential hypertension: Monitor blood pressures closely.  Normocytic anemia: Appears to be at baseline. Continue to monitor.  history of depression and anxiety: Continue home medications.  History of chronic diastolic congestive heart failure: Echocardiogram from last year suggested in the normal EF. She appears to be well compensated. Her weight is close to baseline. Continue Lasix.   Code Status: full Family Communication: patient Disposition Plan:    Consultants:    Procedures:    Antibiotics:    HPI/Subjective: Feeling some better-- just repeating that she wants to be "pain free not brain free" Says she is following up with [facility]  Objective: Filed Vitals:   04/04/15 2052 04/05/15 0612  BP: 109/65 138/71  Pulse: 78 100  Temp: 98.3 F (36.8 C) 97.7 F (36.5 C)  Resp: 20 20   No intake or output data in the 24 hours ending 04/05/15 1124 Filed Weights   04/04/15 0804 04/05/15 0612  Weight: 83.915 kg (185 lb) 76.567 kg (168 lb 12.8 oz)    Exam:   General:  Awake, areas of bruising  Cardiovascular: rrr  Respiratory: very diminished, no  wheezing  Abdomen: +BS, soft  Musculoskeletal: no edema   Data Reviewed: Basic Metabolic Panel:  Recent Labs Lab 03/31/15 0122 04/01/15 0503 04/02/15 0459 04/04/15 0858 04/05/15 0602  NA 143 145 145 144 139  K 4.6 4.2 3.9 3.4* 4.3  CL 102 108 102 103 100*  CO2 32 33* 36* 33* 32  GLUCOSE 232* 110* 113* 120* 163*  BUN 11 22* '20 14 17  '$ CREATININE 0.63 0.70 0.62 0.44 0.49  CALCIUM 8.9 9.0 8.6* 8.6* 9.1   Liver Function Tests:  Recent Labs Lab 04/04/15 0858  AST 22  ALT 31  ALKPHOS 88  BILITOT 1.0  PROT 5.7*  ALBUMIN 3.2*   No results for input(s): LIPASE, AMYLASE in the last 168 hours. No results for input(s): AMMONIA in the last 168 hours. CBC:  Recent Labs Lab 03/30/15 1450  03/31/15 0122 04/01/15 0503 04/02/15 0459 04/04/15 0858 04/05/15 0602  WBC 3.9*  < > 2.5* 5.5 5.3 5.2 5.3  NEUTROABS 2.6  --   --   --   --  3.7  --   HGB 10.9*  < > 10.4* 9.5* 9.6* 10.2* 9.7*  HCT 36.2  < > 34.8* 32.3* 33.2* 34.9* 32.2*  MCV 98.1  < > 98.0 101.6* 101.5* 99.4 97.9  PLT 275  < > 308 273 269 222 225  < > = values in this interval not displayed. Cardiac Enzymes:  Recent Labs Lab 03/30/15 2004 03/31/15 0125 03/31/15 0538  TROPONINI <0.03 <0.03 <0.03   BNP (last 3 results)  Recent Labs  02/04/15 1142 02/24/15 0524 03/27/15 1013  BNP 217.6* 306.5* 227.9*  ProBNP (last 3 results) No results for input(s): PROBNP in the last 8760 hours.  CBG:  Recent Labs Lab 04/04/15 1716 04/05/15 0609  GLUCAP 242* 152*    Recent Results (from the past 240 hour(s))  Culture, expectorated sputum-assessment     Status: None   Collection Time: 03/30/15  3:20 PM  Result Value Ref Range Status   Specimen Description SPUTUM  Final   Special Requests Normal  Final   Sputum evaluation THIS SPECIMEN IS ACCEPTABLE FOR SPUTUM CULTURE  Final   Report Status 03/30/2015 FINAL  Final  Culture, respiratory (NON-Expectorated)     Status: None   Collection Time: 03/30/15  3:20  PM  Result Value Ref Range Status   Specimen Description SPUTUM  Final   Special Requests NONE  Final   Gram Stain   Final    ABUNDANT WBC PRESENT,BOTH PMN AND MONONUCLEAR FEW SQUAMOUS EPITHELIAL CELLS PRESENT FEW GRAM POSITIVE COCCI IN CLUSTERS Performed at [facility]    Culture   Final    MODERATE PSEUDOMONAS AERUGINOSA Performed at [facility]    Report Status 04/03/2015 FINAL  Final   Organism ID, Bacteria PSEUDOMONAS AERUGINOSA  Final      Susceptibility   Pseudomonas aeruginosa - MIC*    CEFEPIME <=1 SENSITIVE Sensitive     CEFTAZIDIME 4 SENSITIVE Sensitive     CIPROFLOXACIN <=0.25 SENSITIVE Sensitive     GENTAMICIN <=1 SENSITIVE Sensitive     IMIPENEM 2 SENSITIVE Sensitive     PIP/TAZO <=4 SENSITIVE Sensitive     TOBRAMYCIN <=1 SENSITIVE Sensitive     * MODERATE PSEUDOMONAS AERUGINOSA     Studies: Dg Chest 2 View  04/04/2015  CLINICAL DATA:  Worsening cough, congestion and shortness of breath. EXAM: CHEST  2 VIEW COMPARISON:  03/30/2015 FINDINGS: Chronic elevation of the right hemidiaphragm. No focal airspace disease or pulmonary edema. Heart and mediastinum are stable and within normal limits. No large pleural effusions. Again noted are compression deformities in the mid thoracic spine. IMPRESSION: No active cardiopulmonary disease. Electronically Signed   By: Markus Daft M.D.   On: 04/04/2015 08:54    Scheduled Meds: . antiseptic oral rinse  7 mL Mouth Rinse q12n4p  . aspirin  650 mg Oral Q breakfast  . busPIRone  10 mg Oral TID  . chlorhexidine  15 mL Mouth Rinse BID  . diltiazem  120 mg Oral 3 times per day  . docusate sodium  100 mg Oral BID  . enoxaparin (LOVENOX) injection  40 mg Subcutaneous Q24H  . FLUoxetine  60 mg Oral Daily  . furosemide  40 mg Oral Daily  . levalbuterol  0.63 mg Nebulization TID  . levofloxacin  750 mg Oral Q24H  . LORazepam  1 mg Oral Once  . methylPREDNISolone (SOLU-MEDROL) injection  60 mg Intravenous 3 times per  day  . metoprolol succinate  25 mg Oral Daily  . mometasone-formoterol  2 puff Inhalation BID  . pantoprazole  40 mg Oral BID  . sodium chloride flush  3 mL Intravenous Q12H  . sodium chloride flush  3 mL Intravenous Q12H  . tiotropium  18 mcg Inhalation Daily   Continuous Infusions:  Antibiotics Given (last 72 hours)    Date/Time Action Medication Dose   04/04/15 1706 Given   levofloxacin (LEVAQUIN) tablet 500 mg 500 mg      Principal Problem:   Acute exacerbation of chronic obstructive pulmonary disease (COPD) ([place]) Active Problems:   Essential hypertension  Atrial fibrillation with rapid ventricular response (HCC)   Respiratory failure, chronic (HCC)   Chronic diastolic (congestive) heart failure ([place])   Squamous cell lung cancer ([place])    Time spent: 35 min    [facility] Pager [phone] If 7PM-7AM, please contact night-coverage at [email or web address], password [facility] 04/05/2015, 11:24 AM  LOS: 1 day

## 2015-04-05 NOTE — Progress Notes (Signed)
PT Cancellation Note  Patient Details Name: Kelsey Garcia MRN: [id number] DOB: 22-Aug-1945   Cancelled Treatment:    Reason Eval/Treat Not Completed: Medical issues which prohibited therapy (patient was given medication  prior to rad rtreatment and now is feeling off balance. CNA reports imbalance walking to the BR. will check back tomorrow.)  Tresa Endo PT [phone]  Claretha Cooper 04/05/2015, 3:49 PM

## 2015-04-05 NOTE — Progress Notes (Signed)
[facility] Therapy Treatment Record Phone [phone]   Radiation Therapy was administered to [name] on: 04/05/2015  2:04 PM and was treatment # 6 out of a planned course of 10 treatments.

## 2015-04-06 ENCOUNTER — Ambulatory Visit: Payer: Medicare Other | Admitting: Radiation Oncology

## 2015-04-06 DIAGNOSIS — C3492 Malignant neoplasm of unspecified part of left bronchus or lung: Secondary | ICD-10-CM

## 2015-04-06 MED ORDER — DIAZEPAM 2 MG PO TABS: 1.0000 mg | ORAL_TABLET | Freq: Four times a day (QID) | ORAL | Status: DC | PRN

## 2015-04-06 MED ORDER — PREDNISONE 20 MG PO TABS
60.0000 mg | ORAL_TABLET | Freq: Every day | ORAL | Status: DC
  Administered 2015-04-06 – 2015-04-07 (×2): 60 mg via ORAL
  Filled 2015-04-06 (×2): qty 3

## 2015-04-06 NOTE — Progress Notes (Signed)
   Name: Kelsey Garcia MRN: [id number] DOB: 04-30-1945    ADMISSION DATE:  04/04/2015 CONSULTATION DATE:  2/23  REFERRING MD :  Sheliah Plane [place])   CHIEF COMPLAINT:  COPD   BRIEF PATIENT DESCRIPTION: 70yo female former smoker (quit 2016) with hx Afib, COPD on 3L home O2 and maintenance prednisone ('5mg'$ /day), recent dx NSCLC (squamous cell) currently undergoing radiation therapy with multiple recent admits (just d/c 2/21 after admit for AFib with RVR).  Presented 2/23 with 1 day hx progressive SOB, cough.  Admitted by [facility] with AECOPD.  PCCM consulted for assist given multiple recent admits.    SIGNIFICANT EVENTS    STUDIES:  CT 06/2014 no ILD, resolved nodular ASD CT chest 02/2015 Lingular consolidation, compression fractures T5 and T6 02/27/15 FOB lingular orifice friable but not obstructed/washings pos Sq Cell Ca  03/05/15  FEV1  0.74 (37%) ratio 50 s resp to saba  and dlco 19% corrects to 61 for alv vol     SUBJECTIVE:  Rattling congested cough but no increased wob at rest/ sats ok on 2lpm     VITAL SIGNS: Temp:  [97.7 F (36.5 C)-98.2 F (36.8 C)] 98.2 F (36.8 C) (02/25 0520) Pulse Rate:  [80-104] 80 (02/25 0520) Resp:  [20] 20 (02/24 2053) BP: (109-125)/(55-66) 109/55 mmHg (02/25 0520) SpO2:  [93 %-99 %] 99 % (02/25 0520) Weight:  [168 lb 6.9 oz (76.4 kg)] 168 lb 6.9 oz (76.4 kg) (02/25 0520)    PHYSICAL EXAMINATION: General:  Frail, chronically ill-appearing, tachypnea Neuro:  Awake alert,  nad  HEENT:  Normocephalic atraumatic, extraocular movements intact Cardiovascular:  Regular rate and rhythm, no murmurs gallops or rubs Lungs:  Diminished air movement, a few crackles bases, no clear wheezing no accessory muscle use Abdomen:  Bowel sounds positive, nontender nondistended Musculoskeletal:  Normal bulk and tone Skin:  No rash or skin breakdown   Recent Labs Lab 04/02/15 0459 04/04/15 0858 04/05/15 0602  NA 145 144 139  K 3.9 3.4* 4.3  CL 102 103 100*    CO2 36* 33* 32  BUN '20 14 17  '$ CREATININE 0.62 0.44 0.49  GLUCOSE 113* 120* 163*    Recent Labs Lab 04/02/15 0459 04/04/15 0858 04/05/15 0602  HGB 9.6* 10.2* 9.7*  HCT 33.2* 34.9* 32.2*  WBC 5.3 5.2 5.3  PLT 269 222 225   No results found.  ASSESSMENT / PLAN:  Chronic respiratory failure with hypoxemia and hypercarbia   AECOPD in setting of severe chronic airflow obst at baseline  NSCLC  AFib with RVR  Restrictive changes (Kyphosis) and chronically elevated R HD worse than baseline ? Etiology   PLAN -  Continue IV steroids  Pulmonary hygiene  BD's -   Cont xrt while inpt  HR control - cardizem gtt if needed  Supplemental O2  May need to consider SNF on d/c  Consider palliative care consult  ? Consider sniff test to sort out R HD issue    Christinia Gully, MD Pulmonary and [place] [phone] After 5:30 PM or weekends, call [phone]

## 2015-04-06 NOTE — Progress Notes (Signed)
PT demonstrated verbal and hands on understanding of Flutter device. 

## 2015-04-06 NOTE — Progress Notes (Signed)
PROGRESS NOTE  Kelsey Garcia LHT:[id number] DOB: Nov 17, 1945 DOA: 04/04/2015 PCP: Darden Amber, PA  Assessment/Plan: acute COPD exacerbation with acute respiratory failure: -chronic respiratory failure and is on 2 L of oxygen by nasal cannula at home -steroids.  -Nebulizer treatments - antibiotics.- on levaquin which will treat recent sputum cx + for pseudomonas -pulm has been consulted- appreciate help  atrial fibrillation with RVR: now rate controlled -back on her home medication regimen. -telemetry- -Patient is not on anticoagulation due to personal preference. She takes aspirin daily. The anticoagulation issue has been discussed with the patient multiple times by her cardiologist. Chads 2 vascular score is 4.  Squamous cell lung cancer: She is receiving radiation treatment. -treatment today- 1 mg ativan prior to treatment   history of essential hypertension: Monitor blood pressures closely.  Normocytic anemia: Appears to be at baseline. Continue to monitor.  history of depression and anxiety: Continue home medications.  History of chronic diastolic congestive heart failure: Echocardiogram from last year suggested in the normal EF. She appears to be well compensated. Her weight is close to baseline. Continue Lasix.   Code Status: full Family Communication: patient/spoke with daughter on phone Disposition Plan: SNF in AM   Consultants:    Procedures:    Antibiotics:    HPI/Subjective: Breathing better Thinks she is sharper today   Objective: Filed Vitals:   04/05/15 2053 04/06/15 0520  BP: 115/66 109/55  Pulse: 90 80  Temp: 97.7 F (36.5 C) 98.2 F (36.8 C)  Resp: 20    No intake or output data in the 24 hours ending 04/06/15 1350 Filed Weights   04/04/15 0804 04/05/15 0612 04/06/15 0520  Weight: 83.915 kg (185 lb) 76.567 kg (168 lb 12.8 oz) 76.4 kg (168 lb 6.9 oz)    Exam:   General:  Awake, areas of bruising  Cardiovascular:  rrr  Respiratory: diminished, no wheezing  Abdomen: +BS, soft  Musculoskeletal: no edema   Data Reviewed: Basic Metabolic Panel:  Recent Labs Lab 03/31/15 0122 04/01/15 0503 04/02/15 0459 04/04/15 0858 04/05/15 0602  NA 143 145 145 144 139  K 4.6 4.2 3.9 3.4* 4.3  CL 102 108 102 103 100*  CO2 32 33* 36* 33* 32  GLUCOSE 232* 110* 113* 120* 163*  BUN 11 22* '20 14 17  '$ CREATININE 0.63 0.70 0.62 0.44 0.49  CALCIUM 8.9 9.0 8.6* 8.6* 9.1   Liver Function Tests:  Recent Labs Lab 04/04/15 0858  AST 22  ALT 31  ALKPHOS 88  BILITOT 1.0  PROT 5.7*  ALBUMIN 3.2*   No results for input(s): LIPASE, AMYLASE in the last 168 hours. No results for input(s): AMMONIA in the last 168 hours. CBC:  Recent Labs Lab 03/30/15 1450  03/31/15 0122 04/01/15 0503 04/02/15 0459 04/04/15 0858 04/05/15 0602  WBC 3.9*  < > 2.5* 5.5 5.3 5.2 5.3  NEUTROABS 2.6  --   --   --   --  3.7  --   HGB 10.9*  < > 10.4* 9.5* 9.6* 10.2* 9.7*  HCT 36.2  < > 34.8* 32.3* 33.2* 34.9* 32.2*  MCV 98.1  < > 98.0 101.6* 101.5* 99.4 97.9  PLT 275  < > 308 273 269 222 225  < > = values in this interval not displayed. Cardiac Enzymes:  Recent Labs Lab 03/30/15 2004 03/31/15 0125 03/31/15 0538  TROPONINI <0.03 <0.03 <0.03   BNP (last 3 results)  Recent Labs  02/04/15 1142 02/24/15 0524 03/27/15 1013  BNP 217.6* 306.5*  227.9*    ProBNP (last 3 results) No results for input(s): PROBNP in the last 8760 hours.  CBG:  Recent Labs Lab 04/04/15 1716 04/05/15 0609  GLUCAP 242* 152*    Recent Results (from the past 240 hour(s))  Culture, expectorated sputum-assessment     Status: None   Collection Time: 03/30/15  3:20 PM  Result Value Ref Range Status   Specimen Description SPUTUM  Final   Special Requests Normal  Final   Sputum evaluation THIS SPECIMEN IS ACCEPTABLE FOR SPUTUM CULTURE  Final   Report Status 03/30/2015 FINAL  Final  Culture, respiratory (NON-Expectorated)     Status: None    Collection Time: 03/30/15  3:20 PM  Result Value Ref Range Status   Specimen Description SPUTUM  Final   Special Requests NONE  Final   Gram Stain   Final    ABUNDANT WBC PRESENT,BOTH PMN AND MONONUCLEAR FEW SQUAMOUS EPITHELIAL CELLS PRESENT FEW GRAM POSITIVE COCCI IN CLUSTERS Performed at [facility]    Culture   Final    MODERATE PSEUDOMONAS AERUGINOSA Performed at [facility]    Report Status 04/03/2015 FINAL  Final   Organism ID, Bacteria PSEUDOMONAS AERUGINOSA  Final      Susceptibility   Pseudomonas aeruginosa - MIC*    CEFEPIME <=1 SENSITIVE Sensitive     CEFTAZIDIME 4 SENSITIVE Sensitive     CIPROFLOXACIN <=0.25 SENSITIVE Sensitive     GENTAMICIN <=1 SENSITIVE Sensitive     IMIPENEM 2 SENSITIVE Sensitive     PIP/TAZO <=4 SENSITIVE Sensitive     TOBRAMYCIN <=1 SENSITIVE Sensitive     * MODERATE PSEUDOMONAS AERUGINOSA     Studies: No results found.  Scheduled Meds: . antiseptic oral rinse  7 mL Mouth Rinse q12n4p  . arformoterol  15 mcg Nebulization BID  . aspirin  650 mg Oral Q breakfast  . budesonide (PULMICORT) nebulizer solution  0.5 mg Nebulization BID  . busPIRone  10 mg Oral TID  . chlorhexidine  15 mL Mouth Rinse BID  . diltiazem  120 mg Oral 3 times per day  . docusate sodium  100 mg Oral BID  . enoxaparin (LOVENOX) injection  40 mg Subcutaneous Q24H  . FLUoxetine  60 mg Oral Daily  . furosemide  40 mg Oral Daily  . levalbuterol  0.63 mg Nebulization TID  . levofloxacin  750 mg Oral Q24H  . metoprolol succinate  25 mg Oral Daily  . pantoprazole  40 mg Oral BID  . predniSONE  60 mg Oral Q breakfast  . sodium chloride flush  3 mL Intravenous Q12H  . sodium chloride flush  3 mL Intravenous Q12H  . tiotropium  18 mcg Inhalation Daily   Continuous Infusions:  Antibiotics Given (last 72 hours)    Date/Time Action Medication Dose   04/04/15 1706 Given   levofloxacin (LEVAQUIN) tablet 500 mg 500 mg   04/05/15 1818 Given    levofloxacin (LEVAQUIN) tablet 750 mg 750 mg      Principal Problem:   Acute exacerbation of chronic obstructive pulmonary disease (COPD) ([place]) Active Problems:   Essential hypertension   Atrial fibrillation with rapid ventricular response (HCC)   Respiratory failure, chronic (HCC)   Chronic diastolic (congestive) heart failure ([place])   Squamous cell lung cancer ([place])    Time spent: 25 min    [place] [facility] Pager [phone] If 7PM-7AM, please contact night-coverage at [email or web address], password [facility] 04/06/2015, 1:49 PM  LOS: 2 days

## 2015-04-06 NOTE — Clinical Social Work Note (Signed)
CSW updated [facility] liaison Tammy at [phone] that patient not medically ready on Saturday per MD, but may be ready on Sunday.  [facility] can take patient on Sunday if he is medically ready for discharge and orders have been received.  Jones Broom. [place], MSW, [place] 04/06/2015 6:27 PM

## 2015-04-06 NOTE — Evaluation (Signed)
Physical Therapy Evaluation Patient Details Name: Kelsey Garcia MRN: [id number] DOB: 1945-05-28 Today's Date: 04/06/2015   History of Present Illness  Kelsey Garcia is a 70 y.o. female with a past medical history of COPD, atrial fibrillation, lung cancer, receiving radiation treatment who was discharged on February 21 after being managed for atrial fibrillation with RVR. On 2/23  she started feeling short of breath. readmitted for COPD exacerbation.  Clinical Impression  Patient is very tremulous and tangential in thinking. Patient will benefit from PT to address problems listed in the note below to DC to next venue.sats on 3 L 93% after short walk.    Follow Up Recommendations SNF;Supervision/Assistance - 24 hour    Equipment Recommendations  None recommended by PT    Recommendations for Other Services       Precautions / Restrictions Precautions Precautions: Fall Precaution Comments: O2 dep Restrictions Weight Bearing Restrictions: No      Mobility  Bed Mobility   Bed Mobility: Supine to Sit;Sit to Supine     Supine to sit: Independent        Transfers Overall transfer level: Needs assistance Equipment used: Rolling walker (2 wheeled) Transfers: Sit to/from Stand Sit to Stand: Min assist         General transfer comment: assist to steady. increased time to rise. stop to get her breath at times  Ambulation/Gait Ambulation/Gait assistance: Min assist Ambulation Distance (Feet):  (x 2) Assistive device: Rolling walker (2 wheeled) Gait Pattern/deviations: Step-through pattern;Decreased stride length     General Gait Details: assist to stabilize pt and maneuver with walker, tends to run into the doorway, chair. slow gait speed. remained on 3 L O2,   Stairs            Wheelchair Mobility    Modified Rankin (Stroke Patients Only)       Balance           Standing balance support: During functional activity;Bilateral upper extremity  supported Standing balance-Leahy Scale: Poor Standing balance comment: very tremulous                             Pertinent Vitals/Pain Pain Assessment: 0-10 Pain Score: 8  Pain Location: R shoulder Pain Descriptors / Indicators: Discomfort Pain Intervention(s): Monitored during session    Home Living Family/patient expects to be discharged to:: Private residence Living Arrangements: Children;Other relatives Available Help at Discharge: Available PRN/intermittently Type of Home: House Home Access: Stairs to enter Entrance Stairs-Rails: Left Entrance Stairs-Number of Steps: 5-6 Home Layout: Two level;Bed/bath upstairs;1/2 bath on main level Home Equipment: Walker - 2 wheels;Wheelchair - power;Shower seat;Grab bars - tub/shower;Bedside commode;Wheelchair - manual Additional Comments: info from previous admit as patient was scattered in giving info re: home and steps    Prior Function Level of Independence: Independent with assistive device(s)   Gait / Transfers Assistance Needed: pt uses w/c or scooters in community for longer distances, amb with device in home;  ADL's / Homemaking Assistance Needed: Reports she requires occasional assist for bathing/dressing "when shoulder acts up" but she usually performs without assistance  Comments: Pt reports she spends most of her times upstairs since bed and bath are up there.   She has kitchenette upstairs and dtr provides dinner      Hand Dominance   Dominant Hand: Right    Extremity/Trunk Assessment   Upper Extremity Assessment: Generalized weakness;RUE deficits/detail;LUE deficits/detail RUE Deficits / Details: very tremulous both  arms         Lower Extremity Assessment: Generalized weakness      Cervical / Trunk Assessment: Kyphotic  Communication   Communication: No difficulties  Cognition Arousal/Alertness: Awake/alert Behavior During Therapy: WFL for tasks assessed/performed Overall Cognitive Status:  Within Functional Limits for tasks assessed (referred to calendar)                      General Comments      Exercises        Assessment/Plan    PT Assessment Patient needs continued PT services  PT Diagnosis Difficulty walking;Generalized weakness   PT Problem List Decreased strength;Decreased activity tolerance;Decreased balance;Decreased mobility  PT Treatment Interventions DME instruction;Gait training;Functional mobility training;Therapeutic activities;Patient/family education;Therapeutic exercise;Balance training   PT Goals (Current goals can be found in the Care Plan section) Acute Rehab PT Goals Patient Stated Goal: i will go to rehab, want to see my cats PT Goal Formulation: With patient Time For Goal Achievement: 04/20/15 Potential to Achieve Goals: Fair    Frequency Min 3X/week   Barriers to discharge        Co-evaluation               End of Session Equipment Utilized During Treatment: Oxygen;Gait belt Activity Tolerance: Patient limited by fatigue Patient left: in bed;with call bell/phone within reach;with bed alarm set Nurse Communication: Mobility status         Time: 8022-3361 PT Time Calculation (min) (ACUTE ONLY): 21 min   Charges:   PT Evaluation $PT Eval Low Complexity: 1 Procedure     PT G CodesClaretha Cooper 04/06/2015, 10:38 AM Tresa Endo PT [phone]

## 2015-04-07 DIAGNOSIS — J9612 Chronic respiratory failure with hypercapnia: Secondary | ICD-10-CM

## 2015-04-07 MED ORDER — ARFORMOTEROL TARTRATE 15 MCG/2ML IN NEBU: 15.0000 ug | INHALATION_SOLUTION | Freq: Two times a day (BID) | RESPIRATORY_TRACT | Status: DC

## 2015-04-07 MED ORDER — HYDROCODONE-HOMATROPINE 5-1.5 MG/5ML PO SYRP: 2.5000 mL | ORAL_SOLUTION | Freq: Two times a day (BID) | ORAL | Status: DC | PRN

## 2015-04-07 MED ORDER — LEVALBUTEROL HCL 0.63 MG/3ML IN NEBU: 0.6300 mg | INHALATION_SOLUTION | Freq: Three times a day (TID) | RESPIRATORY_TRACT | Status: DC

## 2015-04-07 MED ORDER — PREDNISONE 20 MG PO TABS: ORAL_TABLET | ORAL | Status: DC

## 2015-04-07 MED ORDER — BUDESONIDE 0.5 MG/2ML IN SUSP: 0.5000 mg | Freq: Two times a day (BID) | RESPIRATORY_TRACT | Status: DC

## 2015-04-07 MED ORDER — HYDROCODONE-ACETAMINOPHEN 10-325 MG PO TABS: 1.0000 | ORAL_TABLET | Freq: Four times a day (QID) | ORAL | Status: DC | PRN

## 2015-04-07 MED ORDER — DIAZEPAM 2 MG PO TABS: 1.0000 mg | ORAL_TABLET | Freq: Four times a day (QID) | ORAL | Status: DC | PRN

## 2015-04-07 MED ORDER — LEVOFLOXACIN 750 MG PO TABS: 750.0000 mg | ORAL_TABLET | ORAL | Status: DC

## 2015-04-07 MED ORDER — LEVALBUTEROL HCL 0.63 MG/3ML IN NEBU: 0.6300 mg | INHALATION_SOLUTION | Freq: Four times a day (QID) | RESPIRATORY_TRACT | Status: DC | PRN

## 2015-04-07 NOTE — Progress Notes (Signed)
   Name: Kelsey Garcia MRN: [id number] DOB: 01/10/1946    ADMISSION DATE:  04/04/2015 CONSULTATION DATE:  2/23  REFERRING MD :  Sheliah Plane [place])   CHIEF COMPLAINT:  COPD   BRIEF PATIENT DESCRIPTION: 70yo female former smoker (quit 2016) with hx Afib, COPD on 3L home O2 and maintenance prednisone ('5mg'$ /day), recent dx NSCLC (squamous cell) currently undergoing radiation therapy with multiple recent admits (just d/c 2/21 after admit for AFib with RVR).  Presented 2/23 with 1 day hx progressive SOB, cough.  Admitted by [facility] with AECOPD.  PCCM consulted for assist given multiple recent admits.    SIGNIFICANT EVENTS    STUDIES:  CT 06/2014 no ILD, resolved nodular ASD CT chest 02/2015 Lingular consolidation, compression fractures T5 and T6 02/27/15 FOB lingular orifice friable but not obstructed/washings pos Sq Cell Ca  03/05/15  FEV1  0.74 (37%) ratio 50 s resp to saba  and dlco 19% corrects to 61 for alv vol     SUBJECTIVE:  Comfortable  on 2lpm     VITAL SIGNS: Temp:  [98 F (36.7 C)-98.5 F (36.9 C)] 98.1 F (36.7 C) (02/26 0456) Pulse Rate:  [95-101] 99 (02/26 1054) Resp:  [18] 18 (02/26 0456) BP: (100-133)/(51-71) 125/71 mmHg (02/26 1054) SpO2:  [93 %-100 %] 93 % (02/26 0821) Weight:  [166 lb 14.4 oz (75.705 kg)] 166 lb 14.4 oz (75.705 kg) (02/26 0456)    PHYSICAL EXAMINATION: General:  Frail, chronically ill-appearing, tachypnea Neuro:  Awake alert,  nad  HEENT:  Normocephalic atraumatic, extraocular movements intact Cardiovascular:  Regular rate and rhythm, no murmurs gallops or rubs Lungs:  Diminished air movement, a few crackles bases, no clear wheezing no accessory muscle use Abdomen:  Bowel sounds positive, nontender nondistended Musculoskeletal:  Normal bulk and tone Skin:  No rash or skin breakdown   Recent Labs Lab 04/02/15 0459 04/04/15 0858 04/05/15 0602  NA 145 144 139  K 3.9 3.4* 4.3  CL 102 103 100*  CO2 36* 33* 32  BUN '20 14 17  '$ CREATININE  0.62 0.44 0.49  GLUCOSE 113* 120* 163*    Recent Labs Lab 04/02/15 0459 04/04/15 0858 04/05/15 0602  HGB 9.6* 10.2* 9.7*  HCT 33.2* 34.9* 32.2*  WBC 5.3 5.2 5.3  PLT 269 222 225   No results found.  ASSESSMENT / PLAN:  Chronic respiratory failure with hypoxemia and hypercarbia   AECOPD in setting of severe chronic airflow obst at baseline  NSCLC s significant airway obst AFib with RVR  Restrictive changes (Kyphosis) and chronically elevated R HD worse than baseline ? Etiology   PLAN -  Continue IV steroids  Pulmonary hygiene  BD's -   Cont xrt while inpt  HR control - cardizem   Supplemental O2  May need to consider SNF on d/c  Consider palliative care consult  ? Consider sniff test to sort out R HD issue    We can see again as inpt prn - thx    Christinia Gully, MD Pulmonary and [place] [phone] After 5:30 PM or weekends, call [phone]

## 2015-04-07 NOTE — Progress Notes (Signed)
Patient will discharge to [place] [facility] Anticipated discharge date:2/26 Family notified: angela- dtr Transportation by [facility]- scheduled for 2:30pm  CSW signing off.  Domenica Reamer, [place] Social Worker [phone]

## 2015-04-07 NOTE — Discharge Summary (Signed)
Physician Discharge Summary  Kelsey Garcia:[id number] DOB: 1946/01/21 DOA: 04/04/2015  PCP: Darden Amber, PA  Admit date: 04/04/2015 Discharge date: 04/07/2015  Time spent: 35 minutes  Recommendations for Outpatient Follow-up:  1. levaquin until 3/4 2. Continue radiation 3. O2 2-3L at all time 4. Palliative care to follow at SNF 5. Medication counseling- told nurse she takes most meds PRN, not as prescribed   Discharge Diagnoses:  Principal Problem:   Acute exacerbation of chronic obstructive pulmonary disease (COPD) ([place]) Active Problems:   Essential hypertension   Atrial fibrillation with rapid ventricular response (HCC)   Respiratory failure, chronic (HCC)   Chronic diastolic (congestive) heart failure (HCC)   Squamous cell lung cancer ([place])   Discharge Condition: improved  Diet recommendation: cardiac  Filed Weights   04/05/15 0612 04/06/15 0520 04/07/15 0456  Weight: 76.567 kg (168 lb 12.8 oz) 76.4 kg (168 lb 6.9 oz) 75.705 kg (166 lb 14.4 oz)    History of present illness:  Kelsey Garcia is a 70 y.o. female with a past medical history of COPD, atrial fibrillation, lung cancer, receiving radiation treatment who was discharged on February 21 after being managed for atrial fibrillation with RVR. She apparently was doing well the first day after discharge. However, yesterday she started feeling short of breath. She started coughing, which was a dry cough. She was wheezing. Her symptoms got worse overnight and so she called EMS to bring her over to the hospital. She has had some chest discomfort. Has had some nausea but no vomiting. She has been taking her medications as prescribed. She tried taking her nebulizer treatments without any improvement. No recent antibiotic use. She has also taken her medications for atrial fibrillation. She is not an anticoagulation and has declined many times. She takes aspirin.  In the emergency department, patient was thought to have COPD  exacerbation. She was given nebulizer treatments. She was also noted to be in rapid atrial fibrillation. We were consulted for further management.  Hospital Course:  acute COPD exacerbation with acute respiratory failure: -chronic respiratory failure and is on 2-3 L of oxygen by nasal cannula at home -steroids.  -Nebulizer treatments - antibiotics.- on levaquin which will treat recent sputum cx + for pseudomonas -pulm has been consulted- appreciate help  atrial fibrillation with RVR: now rate controlled -back on her home medication regimen. -Patient is not on anticoagulation due to personal preference. She takes aspirin daily. The anticoagulation issue has been discussed with the patient multiple times by her cardiologist. Chads 2 vascular score is 4.  Squamous cell lung cancer: She is receiving radiation treatment. -6/10 completed  history of essential hypertension: Monitor blood pressures closely.  Normocytic anemia: Appears to be at baseline. Continue to monitor.  history of depression and anxiety: Continue home medications.  History of chronic diastolic congestive heart failure: Echocardiogram from last year suggested in the normal EF. She appears to be well compensated. Her weight is close to baseline. Continue Lasix.    Procedures:    Consultations:  pulm  Discharge Exam: Filed Vitals:   04/07/15 0456 04/07/15 1054  BP: 133/71 125/71  Pulse: 101 99  Temp: 98.1 F (36.7 C)   Resp: 18     General: feeling better- thinks she is back to her baseline Cardiovascular: irr- rate controlled Respiratory: moving more air-- no wheezing  Discharge Instructions   Discharge Instructions    Diet - low sodium heart healthy    Complete by:  As directed  Discharge instructions    Complete by:  As directed   Palliative to follow at SNF Outpatient sniff test to evaluate diaphragm 2-3L O2 24/7     Increase activity slowly    Complete by:  As directed            Current Discharge Medication List    START taking these medications   Details  arformoterol (BROVANA) 15 MCG/2ML NEBU Take 2 mLs (15 mcg total) by nebulization 2 (two) times daily. Qty: 120 mL    budesonide (PULMICORT) 0.5 MG/2ML nebulizer solution Take 2 mLs (0.5 mg total) by nebulization 2 (two) times daily. Refills: 12    !! levalbuterol (XOPENEX) 0.63 MG/3ML nebulizer solution Take 3 mLs (0.63 mg total) by nebulization every 6 (six) hours as needed for wheezing or shortness of breath. Qty: 3 mL, Refills: 12    !! levalbuterol (XOPENEX) 0.63 MG/3ML nebulizer solution Take 3 mLs (0.63 mg total) by nebulization 3 (three) times daily. Qty: 3 mL, Refills: 12    levofloxacin (LEVAQUIN) 750 MG tablet Take 1 tablet (750 mg total) by mouth daily.     !! - Potential duplicate medications found. Please discuss with provider.    CONTINUE these medications which have CHANGED   Details  diazepam (VALIUM) 2 MG tablet Take 0.5 tablets (1 mg total) by mouth every 6 (six) hours as needed for anxiety. Qty: 10 tablet, Refills: 0    HYDROcodone-acetaminophen (NORCO) 10-325 MG tablet Take 1 tablet by mouth every 6 (six) hours as needed for moderate pain (Pain or Dyspnea). Qty: 15 tablet, Refills: 0    HYDROcodone-homatropine (HYCODAN) 5-1.5 MG/5ML syrup Take 2.5 mLs by mouth 2 (two) times daily as needed for cough. Qty: 120 mL, Refills: 0    predniSONE (DELTASONE) 20 MG tablet 60 mg x 2 days, 50 mg x 2 days, 40 mg x 2 days, 30  Mg x 2 days, 20 mg x2 days, '10mg'$ x2 days, '5mg'$  continuous      CONTINUE these medications which have NOT CHANGED   Details  aspirin 325 MG EC tablet Take 650 mg by mouth daily with breakfast.     busPIRone (BUSPAR) 10 MG tablet Take 10 mg by mouth 3 (three) times daily.    diltiazem (CARDIZEM) 120 MG tablet Take 1 tablet (120 mg total) by mouth every 8 (eight) hours. Qty: 90 tablet, Refills: 0    FLUoxetine (PROZAC) 20 MG capsule TAKE THREE CAPSULES BY MOUTH ONCE  DAILY Qty: 90 capsule, Refills: 5    furosemide (LASIX) 20 MG tablet Take 40 mg by mouth daily.     guaiFENesin (MUCINEX) 600 MG 12 hr tablet Take 600 mg by mouth 2 (two) times daily as needed for cough.    ipratropium-albuterol (DUONEB) 0.5-2.5 (3) MG/3ML SOLN Take 3 mLs by nebulization 3 (three) times daily. Qty: 360 mL, Refills: 3    metoprolol succinate (TOPROL-XL) 25 MG 24 hr tablet Take 1 tablet (25 mg total) by mouth daily. Qty: 90 tablet, Refills: 3    NON FORMULARY Place 1 drop into both eyes 2 (two) times daily as needed (hurting, dry). thera- tears    pantoprazole (PROTONIX) 40 MG tablet TAKE ONE TABLET BY MOUTH TWICE DAILY Qty: 60 tablet, Refills: 5    SPIRIVA HANDIHALER 18 MCG inhalation capsule PLACE ONE CAPSULE INTO INHALER AND INHALE DAILY Qty: 90 capsule, Refills: 0    VENTOLIN HFA 108 (90 Base) MCG/ACT inhaler INHALE ONE TO TWO PUFFS BY MOUTH EVERY 6 HOURS AS NEEDED FOR  WHEEZING Qty: 18 each, Refills: 0    Wound Dressings (SONAFINE) Apply 1 application topically 3 (three) times daily as needed (for radiation).       STOP taking these medications     albuterol (PROVENTIL) (2.5 MG/3ML) 0.083% nebulizer solution      SYMBICORT 160-4.5 MCG/ACT inhaler        Allergies  Allergen Reactions  . Ace Inhibitors Cough  . Codeine Swelling    Face and lips swelling  Patient report Can tolerate hydrocodone  . Pseudoeph-Doxylamine-Dm-Apap Itching  . Diphenhydramine Hcl Other (See Comments)    "feels like my skin is crawling, and legs twitches"  . Erythromycin Diarrhea  . Nsaids Nausea And Vomiting  . Nyquil [Pseudoeph-Doxylamine-Dm-Apap] Itching  . Tramadol Hcl Other (See Comments)    stomach pain, hallucinations  . Fosamax [Alendronate] Other (See Comments)    Nausea and abdominal bloating.   . Tomato Other (See Comments)    Stomach swells  . Orange Fruit [Citrus] Rash  . Pseudoephedrine Palpitations   Follow-up Information    Schedule an appointment as  soon as possible for a visit with Darden Amber, PA.   Why:  As needed This is your assigned Medicaid [place] access doctor If you prefer another contact [place] assigned your doctor *You may receive a bill if you go to any family Dr not assigned to you   Contact information:   [address] [place] [address] [phone]       Follow up with Myna Bright access covered patient .   WhyKathleen Argue [phone] [address] [place], [place] [address] [email or web address] Use this website to assist with understanding your coverage & to renew application   Contact information:   As a Medicaid client you MUST contact DSS/SSI each time you change address, move to another [place] or another state to keep your address updated  [place] Medicaid Transportation to Dr appts if you are have full Medicaid: [phone], [phone]      Follow up with Darden Amber, PA.   Contact information:   [place] [address] [phone]       Please follow up.   Why:  had completed 6/10 radiation treatments       The results of significant diagnostics from this hospitalization (including imaging, microbiology, ancillary and laboratory) are listed below for reference.    Significant Diagnostic Studies: Dg Chest 2 View  04/04/2015  CLINICAL DATA:  Worsening cough, congestion and shortness of breath. EXAM: CHEST  2 VIEW COMPARISON:  03/30/2015 FINDINGS: Chronic elevation of the right hemidiaphragm. No focal airspace disease or pulmonary edema. Heart and mediastinum are stable and within normal limits. No large pleural effusions. Again noted are compression deformities in the mid thoracic spine. IMPRESSION: No active cardiopulmonary disease. Electronically Signed   By: Markus Daft M.D.   On: 04/04/2015 08:54   Dg Chest 2 View  03/30/2015  CLINICAL DATA:  History of lung cancer and COPD. Shortness of breath. EXAM: CHEST  2 VIEW  COMPARISON:  03/27/2015 and 02/27/2015 FINDINGS: Lungs are adequately inflated with mild interval improvement of hazy lingular airspace density. No evidence of effusion or pneumothorax. Mild stable cardiomegaly. Calcified plaque over the thoracic aorta. Stable known mild compression of several mid to upper thoracic vertebral bodies. IMPRESSION: Slight interval improvement in hazy lingular airspace density. Stable known thoracic spine compression fractures. Stable cardiomegaly. Electronically Signed   By: Marin Olp  M.D.   On: 03/30/2015 14:52   Dg Chest 2 View  03/27/2015  CLINICAL DATA:  Worsening shortness of breath, cough, chest congestion and history of lung cancer. EXAM: CHEST  2 VIEW COMPARISON:  02/27/2015 FINDINGS: The heart is enlarged. There is atherosclerosis an unfolding of the aorta. Mild chronic density in the lingula as seen previously. Remainder of the lungs are clear. No effusions. There are partial compression fractures in the upper to mid thoracic spine affecting T3, T5 and T6 as seen previously. No evidence of progression or new fracture. IMPRESSION: Mild persistent patchy density in the lingula consistent with the patient's known disease. The remainder of the chest is clear. Atherosclerosis of the aorta. Compression fractures T3, T5 and T6 as seen previously Electronically Signed   By: Nelson Chimes M.D.   On: 03/27/2015 10:14   Ct Head Wo Contrast  03/28/2015  CLINICAL DATA:  Confusion. Altered mental status. Dizziness. Lung cancer. EXAM: CT HEAD WITHOUT CONTRAST TECHNIQUE: Contiguous axial images were obtained from the base of the skull through the vertex without intravenous contrast. COMPARISON:  Multiple exams, including 02/28/2015 FINDINGS: Central pontine hypodensity is partially attributable to to streak artifact and partially attributable to chronic ischemic microvascular white matter disease. Otherwise, the brainstem, cerebellum, cerebral peduncles, thalami, basal ganglia,  basilar cisterns, and ventricular system appear within normal limits. Periventricular white matter and corona radiata hypodensities favor chronic ischemic microvascular white matter disease. No intracranial hemorrhage, mass lesion, or acute CVA. IMPRESSION: 1. No acute intracranial findings to explain the patient's altered mental status and confusion. 2. Evidence of chronic is a ischemic microvascular white matter disease involving the periventricular white matter and the pons, not appreciably changed from recent prior exams. Electronically Signed   By: Van Clines M.D.   On: 03/28/2015 13:24   Ct Angio Chest Pe W/cm &/or Wo Cm  03/30/2015  CLINICAL DATA:  Fatigue and shortness of breath. The patient started radiation therapy for lung cancer 6 days ago. EXAM: CT ANGIOGRAPHY CHEST WITH CONTRAST TECHNIQUE: Multidetector CT imaging of the chest was performed using the standard protocol during bolus administration of intravenous contrast. Multiplanar CT image reconstructions and MIPs were obtained to evaluate the vascular anatomy. CONTRAST:  143m OMNIPAQUE IOHEXOL 350 MG/ML SOLN COMPARISON:  Chest radiographs obtained earlier today. PET-CT dated 03/13/2015. Chest CT dated 02/25/2015. FINDINGS: Mediastinum/Lymph Nodes: Normally opacified pulmonary arteries with no pulmonary arterial filling defects seen. A mildly prominent precarinal lymph node has not changed significantly, with a short axis diameter of 12 mm on image number 26. This did not have increased activity on the recent PET-CT. There is also mild concentric soft tissue thickening around the proximal left upper lobe bronchus in the left hilum, measuring 1.3 x 1.2 cm on image number 28. This is causing moderate to marked concentric bronchial narrowing. There are similar changes causing moderate concentric narrowing of the proximal lingular bronchus. Lungs/Pleura: Mild further decrease in lingular atelectasis. No lung nodules. No pleural fluid. Upper  abdomen: No acute findings. Musculoskeletal: Stable old T5-T6 vertebral compression deformities. Less prominent associated sclerosis. Review of the MIP images confirms the above findings. IMPRESSION: 1. No pulmonary emboli or acute abnormality. 2. Soft tissue thickening in the left hilum, causing moderate to marked concentric luminal narrowing involving the proximal left upper lobe bronchus and moderate concentric luminal narrowing involving the proximal lingular bronchus. This may represent the patient's recently diagnosed malignancy. 3. Mild further improvement in lingular atelectasis. 4. Stable mildly prominent precarinal lymph no without hypermetabolism. Electronically  Signed   By: Claudie Revering M.D.   On: 03/30/2015 18:33   Nm Pet Image Initial (pi) Skull Base To Thigh  03/13/2015  CLINICAL DATA:  Initial treatment strategy for left lung squamous cell carcinoma on bronchoscopic brushing. EXAM: NUCLEAR MEDICINE PET SKULL BASE TO THIGH TECHNIQUE: 9.4 mCi F-18 FDG was injected intravenously. Full-ring PET imaging was performed from the skull base to thigh after the radiotracer. CT data was obtained and used for attenuation correction and anatomic localization. FASTING BLOOD GLUCOSE:  Value: 82 mg/dl COMPARISON:  Chest CT on 02/25/2015 FINDINGS: NECK No hypermetabolic lymph nodes in the neck. CHEST Previously seen airspace opacity in the lingula shows interval improvement since prior study. This shows low-grade metabolic activity with SUV max of 3.0, which is suspicious for postobstructive pneumonitis or other infectious or inflammatory process. A solitary focus of hypermetabolic activity is seen in the left hilum on image 56 of series 3 which has SUV max of 4.1. No corresponding mass or adenopathy is visualized by CT. No other hypermetabolic hilar or mediastinal masses or lymph nodes are identified. ABDOMEN/PELVIS No abnormal hypermetabolic activity within the liver, pancreas, adrenal glands, or spleen. No  hypermetabolic lymph nodes in the abdomen or pelvis. Focal hypermetabolic activity is seen at the anorectal junction corresponding with asymmetric soft tissue prominence. This has SUV max of 12.4. Anorectal carcinoma cannot be excluded. Cholelithiasis is noted, without evidence of cholecystitis. Sigmoid diverticulosis is also demonstrated, without evidence of diverticulitis. SKELETON No focal hypermetabolic activity to suggest skeletal metastasis. IMPRESSION: Interval improvement in lingular airspace disease with low-grade metabolic activity. This is likely due to improving postobstructive pneumonitis. Small solitary focus of hypermetabolic activity in left hilum with SUV max of 4.1, without evidence of corresponding mass by CT. 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 No other metastatic disease identified. Hypermetabolic activity at the anorectal junction corresponding with asymmetric soft tissue prominence PET-CT. Anorectal carcinoma cannot be excluded. Recommend correlation with rectal exam and possible proctoscopy. Electronically Signed   By: Earle Gell M.D.   On: 03/13/2015 09:43    Microbiology: Recent Results (from the past 240 hour(s))  Culture, expectorated sputum-assessment     Status: None   Collection Time: 03/30/15  3:20 PM  Result Value Ref Range Status   Specimen Description SPUTUM  Final   Special Requests Normal  Final   Sputum evaluation THIS SPECIMEN IS ACCEPTABLE FOR SPUTUM CULTURE  Final   Report Status 03/30/2015 FINAL  Final  Culture, respiratory (NON-Expectorated)     Status: None   Collection Time: 03/30/15  3:20 PM  Result Value Ref Range Status   Specimen Description SPUTUM  Final   Special Requests NONE  Final   Gram Stain   Final    ABUNDANT WBC PRESENT,BOTH PMN AND MONONUCLEAR FEW SQUAMOUS EPITHELIAL CELLS PRESENT FEW GRAM POSITIVE COCCI IN CLUSTERS Performed at [facility]    Culture   Final     MODERATE PSEUDOMONAS AERUGINOSA Performed at [facility]    Report Status 04/03/2015 FINAL  Final   Organism ID, Bacteria PSEUDOMONAS AERUGINOSA  Final      Susceptibility   Pseudomonas aeruginosa - MIC*    CEFEPIME <=1 SENSITIVE Sensitive     CEFTAZIDIME 4 SENSITIVE Sensitive     CIPROFLOXACIN <=0.25 SENSITIVE Sensitive     GENTAMICIN <=1 SENSITIVE Sensitive     IMIPENEM 2 SENSITIVE Sensitive     PIP/TAZO <=4 SENSITIVE Sensitive     TOBRAMYCIN <=1 SENSITIVE  Sensitive     * MODERATE PSEUDOMONAS AERUGINOSA     Labs: Basic Metabolic Panel:  Recent Labs Lab 04/01/15 0503 04/02/15 0459 04/04/15 0858 04/05/15 0602  NA 145 145 144 139  K 4.2 3.9 3.4* 4.3  CL 108 102 103 100*  CO2 33* 36* 33* 32  GLUCOSE 110* 113* 120* 163*  BUN 22* '20 14 17  '$ CREATININE 0.70 0.62 0.44 0.49  CALCIUM 9.0 8.6* 8.6* 9.1   Liver Function Tests:  Recent Labs Lab 04/04/15 0858  AST 22  ALT 31  ALKPHOS 88  BILITOT 1.0  PROT 5.7*  ALBUMIN 3.2*   No results for input(s): LIPASE, AMYLASE in the last 168 hours. No results for input(s): AMMONIA in the last 168 hours. CBC:  Recent Labs Lab 04/01/15 0503 04/02/15 0459 04/04/15 0858 04/05/15 0602  WBC 5.5 5.3 5.2 5.3  NEUTROABS  --   --  3.7  --   HGB 9.5* 9.6* 10.2* 9.7*  HCT 32.3* 33.2* 34.9* 32.2*  MCV 101.6* 101.5* 99.4 97.9  PLT 273 269 222 225   Cardiac Enzymes: No results for input(s): CKTOTAL, CKMB, CKMBINDEX, TROPONINI in the last 168 hours. BNP: BNP (last 3 results)  Recent Labs  02/04/15 1142 02/24/15 0524 03/27/15 1013  BNP 217.6* 306.5* 227.9*    ProBNP (last 3 results) No results for input(s): PROBNP in the last 8760 hours.  CBG:  Recent Labs Lab 04/04/15 1716 04/05/15 0609  GLUCAP 242* 152*       Signed:  Geradine Girt MD.  [facility] 04/07/2015, 11:46 AM

## 2015-04-08 ENCOUNTER — Non-Acute Institutional Stay (SKILLED_NURSING_FACILITY): Payer: Medicare Other | Admitting: Adult Health

## 2015-04-08 ENCOUNTER — Ambulatory Visit
Admission: RE | Admit: 2015-04-08 | Discharge: 2015-04-08 | Disposition: A | Discharge status: Home / Self Care | Payer: Medicare Other | Source: Ambulatory Visit | Attending: Radiation Oncology | Admitting: Radiation Oncology

## 2015-04-08 ENCOUNTER — Ambulatory Visit: Admission: RE | Admit: 2015-04-08 | Payer: Medicare Other | Source: Ambulatory Visit | Admitting: Radiation Oncology

## 2015-04-08 ENCOUNTER — Encounter: Payer: Self-pay | Admitting: Adult Health

## 2015-04-08 ENCOUNTER — Ambulatory Visit: Payer: Medicare Other | Admitting: Radiation Oncology

## 2015-04-08 ENCOUNTER — Encounter: Payer: Self-pay | Admitting: Radiation Oncology

## 2015-04-08 ENCOUNTER — Telehealth: Payer: Self-pay

## 2015-04-08 VITALS — BP 114/99 | HR 113 | Temp 97.6°F

## 2015-04-08 DIAGNOSIS — F3342 Major depressive disorder, recurrent, in full remission: Secondary | ICD-10-CM | POA: Diagnosis not present

## 2015-04-08 DIAGNOSIS — I5032 Chronic diastolic (congestive) heart failure: Secondary | ICD-10-CM | POA: Diagnosis not present

## 2015-04-08 DIAGNOSIS — C3410 Malignant neoplasm of upper lobe, unspecified bronchus or lung: Secondary | ICD-10-CM

## 2015-04-08 DIAGNOSIS — G894 Chronic pain syndrome: Secondary | ICD-10-CM | POA: Diagnosis not present

## 2015-04-08 DIAGNOSIS — J441 Chronic obstructive pulmonary disease with (acute) exacerbation: Secondary | ICD-10-CM

## 2015-04-08 DIAGNOSIS — I4891 Unspecified atrial fibrillation: Secondary | ICD-10-CM

## 2015-04-08 NOTE — Progress Notes (Signed)
Patient ID: Kelsey Garcia, female   DOB: March 31, 1945, 70 y.o.   MRN: [id number]   Facility: [facility]       Allergies  Allergen Reactions  . Ace Inhibitors Cough  . Codeine Swelling    Face and lips swelling  Patient report Can tolerate hydrocodone  . Pseudoeph-Doxylamine-Dm-Apap Itching  . Diphenhydramine Hcl Other (See Comments)    "feels like my skin is crawling, and legs twitches"  . Erythromycin Diarrhea  . Nsaids Nausea And Vomiting  . Nyquil [Pseudoeph-Doxylamine-Dm-Apap] Itching  . Tramadol Hcl Other (See Comments)    stomach pain, hallucinations  . Fosamax [Alendronate] Other (See Comments)    Nausea and abdominal bloating.   . Tomato Other (See Comments)    Stomach swells  . Orange Fruit [Citrus] Rash  . Pseudoephedrine Palpitations    Chief Complaint  Patient presents with  . Hospitalization Follow-up    Hospital Follow up    HPI:  She has been hospitalized for acute copd exacerbation. She has been diagnoses with lung cancer and is currently receiving radiation therapy. She is here for short term rehab. She does not have any complaints or concerns at this time.  There no nursing concerns at this time.   Past Medical History  Diagnosis Date  . Atrial fibrillation ([place])   . Hypertension   . Insomnia   . Nonischemic cardiomyopathy ([place])   . Hyperlipidemia   . Anxiety   . Cholelithiasis   . Insomnia   . Mitral regurgitation     noted 2010  . H/O epistaxis   . Rhinitis, allergic   . CHF (congestive heart failure) ([place])   . Pneumonia     "several times w/exacerbations of the COPD; nothing in the last year" (07/15/2012)  . COPD (chronic obstructive pulmonary disease) ([place])     as of 7/13 on 2-3L, pfts 10/2008 with mod obstruction  . Chronic bronchitis with COPD (chronic obstructive pulmonary disease) ([place])   . Shortness of breath     "all the time right now" (07/15/2012)  . GERD (gastroesophageal reflux disease)   . QHUTMLYY(503.5)     "weekly"  (07/15/2012)  . Migraines     "weekly for awhile; cleared up as I got older" (07/15/2012)  . DJD (degenerative joint disease)   . Arthritis     "all over" (07/15/2012)  . OCD (obsessive compulsive disorder)   . OCD (obsessive compulsive disorder)   . Depression     h/o SI; "last time I was really serious about it was ~ 1997" (07/15/2012)  . Thoracic vertebral fracture ([place]) 11/03/2014  . Primary lung cancer [facility])     Past Surgical History  Procedure Laterality Date  . Tubal ligation  1972  . Cardioversion  2003; 07/2003  . Video bronchoscopy Bilateral 02/27/2015    Procedure: VIDEO BRONCHOSCOPY WITH FLUORO;  Surgeon: Chesley Mires, MD;  Location: [facility];  Service: Cardiopulmonary;  Laterality: Bilateral;    VITAL SIGNS BP 120/87 mmHg  Pulse 94  Temp(Src) 97.8 F (36.6 C) (Oral)  Resp 20  Ht '5\' 5"'$  (1.651 m)  Wt 170 lb 2 oz (77.168 kg)  BMI 28.31 kg/m2  SpO2 93%  Patient's Medications  New Prescriptions   No medications on file  Previous Medications   ARFORMOTEROL (BROVANA) 15 MCG/2ML NEBU    Take 2 mLs (15 mcg total) by nebulization 2 (two) times daily.   ASPIRIN 325 MG EC TABLET    Take 650 mg by mouth daily with breakfast.  BUSPIRONE (BUSPAR) 10 MG TABLET    Take 10 mg by mouth 3 (three) times daily.   DIAZEPAM (VALIUM) 2 MG TABLET    Take 0.5 tablets (1 mg total) by mouth every 6 (six) hours as needed for anxiety.   DILTIAZEM (CARDIZEM) 120 MG TABLET    Take 1 tablet (120 mg total) by mouth every 8 (eight) hours.   FLUOXETINE (PROZAC) 20 MG CAPSULE    Take 20 mg by mouth 3 (three) times daily.   FUROSEMIDE (LASIX) 20 MG TABLET    Take 40 mg by mouth daily.    GUAIFENESIN (MUCINEX) 600 MG 12 HR TABLET    Take 600 mg by mouth 2 (two) times daily as needed for cough.   HYDROCODONE-ACETAMINOPHEN (NORCO) 10-325 MG TABLET    Take 1 tablet by mouth every 6 (six) hours as needed for moderate pain (Pain or Dyspnea).   HYDROCODONE-HOMATROPINE (HYCODAN) 5-1.5 MG/5ML SYRUP    Take 2.5  mLs by mouth 2 (two) times daily as needed for cough.   IPRATROPIUM-ALBUTEROL (DUONEB) 0.5-2.5 (3) MG/3ML SOLN    Take 3 mLs by nebulization 3 (three) times daily.   LEVALBUTEROL (XOPENEX) 0.63 MG/3ML NEBULIZER SOLUTION    Take 3 mLs (0.63 mg total) by nebulization every 6 (six) hours as needed for wheezing or shortness of breath.   LEVALBUTEROL (XOPENEX) 0.63 MG/3ML NEBULIZER SOLUTION    Take 3 mLs (0.63 mg total) by nebulization 3 (three) times daily.   LEVOFLOXACIN (LEVAQUIN) 750 MG TABLET    Take 1 tablet (750 mg total) by mouth daily.   METOPROLOL SUCCINATE (TOPROL-XL) 25 MG 24 HR TABLET    Take 1 tablet (25 mg total) by mouth daily.   PANTOPRAZOLE (PROTONIX) 40 MG TABLET    TAKE ONE TABLET BY MOUTH TWICE DAILY   PREDNISONE (DELTASONE) 20 MG TABLET    60 mg x 2 days, 50 mg x 2 days, 40 mg x 2 days, 30  Mg x 2 days, 20 mg x2 days, '10mg'$ x2 days, '5mg'$  continuous   SPIRIVA HANDIHALER 18 MCG INHALATION CAPSULE    PLACE ONE CAPSULE INTO INHALER AND INHALE DAILY   VENTOLIN HFA 108 (90 BASE) MCG/ACT INHALER    INHALE ONE TO TWO PUFFS BY MOUTH EVERY 6 HOURS AS NEEDED FOR WHEEZING  Modified Medications   No medications on file  Discontinued Medications     SIGNIFICANT DIAGNOSTIC EXAMS  03-08-15: 2-d echo: Left ventricle: The cavity size was normal. Wall thickness was normal. Systolic function was at the lower limits of normal. The estimated ejection fraction was in the range of 50% to 55%. Although no diagnostic regional wall motion abnormality was identified, this possibility cannot be completely excluded on the basis of this study. - Left atrium: The atrium was mildly dilated. - Right atrium: The atrium was mildly dilated. - Pulmonary arteries: PA peak pressure: 32 mm Hg (S).  03-13-15: PET scan: Interval improvement in lingular airspace disease with low-grade metabolic activity. This is likely due to improving postobstructive pneumonitis. Small solitary focus of hypermetabolic activity in left hilum  with SUV max of 4.1, without evidence of corresponding mass by CT. 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 No other metastatic disease identified.  Hypermetabolic activity at the anorectal junction corresponding with asymmetric soft tissue prominence PET-CT. Anorectal carcinoma cannot be excluded. Recommend correlation with rectal exam and possible proctoscopy.  03-28-15: ct of head: 1. No acute intracranial findings to explain the patient's altered mental status and  confusion. 2. Evidence of chronic is a ischemic microvascular white matter disease involving the periventricular white matter and the pons, not appreciably changed from recent prior exams.  03-30-15: chest x-ray: Slight interval improvement in hazy lingular airspace density.  Stable known thoracic spine compression fractures.  Stable cardiomegaly.  03-30-15: ct angio of chest: 1. No pulmonary emboli or acute abnormality. 2. Soft tissue thickening in the left hilum, causing moderate to marked concentric luminal narrowing involving the proximal left upper lobe bronchus and moderate concentric luminal narrowing involving the proximal lingular bronchus. This may represent the patient's recently diagnosed malignancy. 3. Mild further improvement in lingular atelectasis. 4. Stable mildly prominent precarinal lymph no without hypermetabolism.  04-04-15: chest x-ray; No active cardiopulmonary disease.    LABS REVIEWED:   03-27-15: wbc 4.5; hgb 9.8; hct 33.3; mcv 99.1; plt 308; glucose 128; bun 12; creat 0.64; k+ 5.3; na++144; liver normal albumin 3.2 03-30-15: tsh 0.058 04-04-15: wbc 5.2; hgb 10.2; hct 34.9; mcv 99.4; plt 222; glucose 120; bun 14; creat 0.44; k+ 3.4; na++144; liver normal albumin 3.2 04-05-15: wbc 5.3; hgb 9.7; hct 32.2 ;mcv 97.9; plt 225; glucose 163; bun 17; creat 0.49; k+ 4.3; na++139           Review of Systems  Constitutional: Negative for malaise/fatigue.    Respiratory: Negative for cough and shortness of breath.   Cardiovascular: Negative for chest pain, palpitations and leg swelling.  Gastrointestinal: Negative for heartburn, abdominal pain and constipation.  Musculoskeletal: Negative for myalgias and joint pain.  Skin: Negative.   Neurological: Negative for headaches.  Psychiatric/Behavioral: The patient is not nervous/anxious.      Physical Exam  Constitutional: She is oriented to person, place, and time. She appears well-developed and well-nourished. No distress.  Neck: Neck supple. No JVD present.  Cardiovascular: Normal rate, regular rhythm and intact distal pulses.   Respiratory: . No respiratory distress. diminished sounds throughout  GI: Soft. Bowel sounds are normal. She exhibits no distension. There is no tenderness.  Musculoskeletal: Normal range of motion. She exhibits no edema.  Neurological: She is alert and oriented to person, place, and time.  Skin: Skin is warm and dry. She is not diaphoretic.       ASSESSMENT/ PLAN:  1. COPD: will continue brovana neb treatment twice daily; mucinex twice daily as needed; duoneb three times daily; xopenex 0.63 neb treatment three times daily and every 6 hours as needed spiriva 18 mcg daily; is on prednisone taper with with a 5 mg long term dose has hycodan syrup 2.5 mL twice daily as needed for cough  Will complete levaquin 750 mg   2. Afib: will continue asa 650 mg daily cardizem 120 mg every 8 hours; toprol xl 25 mg daily for rate control  3. Diastolic heart failure: EF 50-55%; will continue lasix 40 mg daily   4. Major depressive disorder : is taking prozac 20 mg three times daily valium 1 mg every 6 hours as needed and takes buspar 10 mg three times daily   5. Chronic pain syndrome: will continue vicodin 10/325 mg every 6 hours as needed for pain  6. Lung cancer: is followed by oncology. Is on radiation therapy. Will continue to monitor her status.     Will check cbc; cmp     Time spent with patient  50  minutes >50% time spent counseling; reviewing medical record; tests; labs; and developing future plan of care     Ok Edwards NP [facility]  Contact  [phone] Monday through Friday 8am- 5pm  After hours call [phone]

## 2015-04-08 NOTE — Progress Notes (Signed)
Ms. Lobello is here for her 6th fraction of radiation to her Left Lung. She has refused radiation today because she has had diarrhea today. She reports one diarrhea stool today before she left to come here. She feels as if she may have more today, and doesn't want it to happen during treatment. She has not taken anything for this diarrhea today.  She also does feel like she may also be having some anxiety. She is currently staying at [facility], and is being transported by the facility to her radiation treatments. She reports she is breathing well, but does state she became short of breath today. She is wearing oxygen at 2 Liters [place]. She reports her skin is normal appearing and she is not using the cream, but I have encouraged her to use it twice daily. She denies any pain, except shoulder pain related to arthritis.   BP 114/99 mmHg  Pulse 113  Temp(Src) 97.6 F (36.4 C) (Oral)  SpO2 98%

## 2015-04-08 NOTE — Progress Notes (Addendum)
   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  CBCT/MVCT images/Port film x-rays were reviewed.  The chart was checked. In a nursing facility, had diarrhea on the ride over here, and declining RT today for that reason  Physical Findings:  oral temperature is 97.6 F (36.4 C). Her blood pressure is 114/99 and her pulse is 113. Her oxygen saturation is 98%.   Wt Readings from Last 3 Encounters:  04/08/15 170 lb 2 oz (77.168 kg)  04/07/15 166 lb 14.4 oz (75.705 kg)  04/02/15 174 lb 6.1 oz (79.1 kg)   NAD, In wheelchair, non toxic appearing  Impression:  The patient is tolerating radiotherapy. Diarrhea.  Plan:  Continue radiotherapy as planned when pt is able.  Diarrhea of unknown etiology. Not due to RT to lung.  I will ask nursing to call pt's facility to inquire as to whether they can test for C-Diff. I will defer to the facility on this matter. ________________________________   Eppie Gibson, M.D.

## 2015-04-08 NOTE — Telephone Encounter (Signed)
Kelsey Garcia daughter Levada Dy called today to confirm her mother's appointment schedule for her remaining radiation treatments. I informed her of the correct times, and she plans to relay these to the rehab facility where Kelsey Garcia is staying so that transportation can be arranged. She will call me to let me know if she has any difficulties arranging the transportation.

## 2015-04-08 NOTE — Telephone Encounter (Signed)
I called and spoke with a Network engineer at [employer] just now.  She reported Ms. Bartoletti's nurse was not available at this time, but she would relay the message to him. I informed her that Ms. Jetter had diarrhea today that prevented her from receiving radiation. Ms. Barnier Radiation Oncologist, Eppie Gibson suggested testing for C-diff, and stated the diarrhea was not from radiation. I left my number with the secretary for the nurse to call if he had any questions.

## 2015-04-09 ENCOUNTER — Encounter: Payer: Self-pay | Admitting: Internal Medicine

## 2015-04-09 ENCOUNTER — Ambulatory Visit: Payer: Medicare Other | Admitting: Radiation Oncology

## 2015-04-09 ENCOUNTER — Non-Acute Institutional Stay (SKILLED_NURSING_FACILITY): Payer: Medicare Other | Admitting: Internal Medicine

## 2015-04-09 ENCOUNTER — Ambulatory Visit
Admission: RE | Admit: 2015-04-09 | Discharge: 2015-04-09 | Disposition: A | Discharge status: Home / Self Care | Payer: Medicare Other | Source: Ambulatory Visit | Attending: Radiation Oncology | Admitting: Radiation Oncology

## 2015-04-09 DIAGNOSIS — K219 Gastro-esophageal reflux disease without esophagitis: Secondary | ICD-10-CM | POA: Diagnosis not present

## 2015-04-09 DIAGNOSIS — C3492 Malignant neoplasm of unspecified part of left bronchus or lung: Secondary | ICD-10-CM

## 2015-04-09 DIAGNOSIS — I10 Essential (primary) hypertension: Secondary | ICD-10-CM

## 2015-04-09 DIAGNOSIS — J441 Chronic obstructive pulmonary disease with (acute) exacerbation: Secondary | ICD-10-CM

## 2015-04-09 DIAGNOSIS — G894 Chronic pain syndrome: Secondary | ICD-10-CM | POA: Diagnosis not present

## 2015-04-09 DIAGNOSIS — I480 Paroxysmal atrial fibrillation: Secondary | ICD-10-CM | POA: Diagnosis not present

## 2015-04-09 DIAGNOSIS — C3410 Malignant neoplasm of upper lobe, unspecified bronchus or lung: Secondary | ICD-10-CM | POA: Diagnosis present

## 2015-04-09 DIAGNOSIS — M159 Polyosteoarthritis, unspecified: Secondary | ICD-10-CM

## 2015-04-09 DIAGNOSIS — E785 Hyperlipidemia, unspecified: Secondary | ICD-10-CM

## 2015-04-09 DIAGNOSIS — M150 Primary generalized (osteo)arthritis: Secondary | ICD-10-CM

## 2015-04-09 DIAGNOSIS — I5032 Chronic diastolic (congestive) heart failure: Secondary | ICD-10-CM

## 2015-04-09 DIAGNOSIS — F3342 Major depressive disorder, recurrent, in full remission: Secondary | ICD-10-CM

## 2015-04-09 DIAGNOSIS — J9611 Chronic respiratory failure with hypoxia: Secondary | ICD-10-CM

## 2015-04-09 DIAGNOSIS — Z510 Encounter for antineoplastic radiation therapy: Secondary | ICD-10-CM | POA: Diagnosis not present

## 2015-04-09 NOTE — Progress Notes (Signed)
     DATE:   Location:      Place of Service:    Extended Emergency Contact Information Primary Emergency Contact: Lucilla Lame Address: [address] [place], [place] [address] [place] Phone: [phone] Mobile Phone: [phone] Relation: Daughter Secondary Emergency Contact: Ulis Rias [place] Phone: [phone] Relation: Nephew  Advanced Directive information    Chief Complaint  Patient presents with  . New Admit To SNF    HPI:    Past Medical History  Diagnosis Date  . Atrial fibrillation ([place])   . Hypertension   . Insomnia   . Nonischemic cardiomyopathy ([place])   . Hyperlipidemia   . Anxiety   . Cholelithiasis   . Insomnia   . Mitral regurgitation     noted 2010  . H/O epistaxis   . Rhinitis, allergic   . CHF (congestive heart failure) ([place])   . Pneumonia     "several times w/exacerbations of the COPD; nothing in the last year" (07/15/2012)  . COPD (chronic obstructive pulmonary disease) ([place])     as of 7/13 on 2-3L, pfts 10/2008 with mod obstruction  . Chronic bronchitis with COPD (chronic obstructive pulmonary disease) ([place])   . Shortness of breath     "all the time right now" (07/15/2012)  . GERD (gastroesophageal reflux disease)   . EGBTDVVO(160.7)     "weekly" (07/15/2012)  . Migraines     "weekly for awhile; cleared up as I got older" (07/15/2012)  . DJD (degenerative joint disease)   . Arthritis     "all over" (07/15/2012)  . OCD (obsessive compulsive disorder)   . OCD (obsessive compulsive disorder)   . Depression     h/o SI; "last time I was really serious about it was ~ 1997" (07/15/2012)  . Thoracic vertebral fracture ([place]) 11/03/2014  . Primary lung cancer [facility])     Past Surgical History  Procedure Laterality Date  . Tubal ligation  1972  . Cardioversion  2003; 07/2003  . Video bronchoscopy Bilateral 02/27/2015    Procedure: VIDEO BRONCHOSCOPY WITH FLUORO;  Surgeon: Chesley Mires, MD;  Location: [facility];  Service: Cardiopulmonary;  Laterality: Bilateral;    Patient Care Team: Darden Amber, PA as PCP - [place], RN as [place], LCSW as [employer] (Licensed [employer] representative)  Social History   Social History  . Marital Status: Divorced    Spouse Name: N/A  . Number of Children: N/A  . Years of Education: N/A                 Medications:   Review of Systems     Physical Exam      This encounter was created in error - please disregard.

## 2015-04-09 NOTE — Progress Notes (Signed)
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 Final  . GFR calc Af Amer 03/27/2015 >60  >60 mL/min Final   Comment: (NOTE) The eGFR has been calculated using the CKD EPI equation. This calculation has not been validated in all clinical situations. eGFR's persistently <60 mL/min signify possible Chronic Kidney Disease.   . Anion gap 03/27/2015 7  5 - 15 Final  . B Natriuretic Peptide 03/27/2015 227.9* 0.0 - 100.0 pg/mL Final  . Lipase 03/27/2015 19  11 - 51 U/L Final  . WBC 03/27/2015 4.5  4.0 - 10.5 K/uL Final  . RBC 03/27/2015 3.36* 3.87 - 5.11 MIL/uL Final  . Hemoglobin 03/27/2015 9.8* 12.0 - 15.0 g/dL Final  . HCT 03/27/2015 33.3* 36.0 - 46.0 % Final  . MCV 03/27/2015 99.1  78.0 - 100.0 fL Final  . MCH 03/27/2015 29.2   26.0 - 34.0 pg Final  . MCHC 03/27/2015 29.4* 30.0 - 36.0 g/dL Final  . RDW 03/27/2015 16.7* 11.5 - 15.5 % Final  . Platelets 03/27/2015 308  150 - 400 K/uL Final  . Neutrophils Relative % 03/27/2015 63   Final  . Neutro Abs 03/27/2015 2.8  1.7 - 7.7 K/uL Final  . Lymphocytes Relative 03/27/2015 22   Final  . Lymphs Abs 03/27/2015 1.0  0.7 - 4.0 K/uL Final  . Monocytes Relative 03/27/2015 14   Final  . Monocytes Absolute 03/27/2015 0.6  0.1 - 1.0 K/uL Final  . Eosinophils Relative 03/27/2015 1   Final  . Eosinophils Absolute 03/27/2015 0.0  0.0 - 0.7 K/uL Final  . Basophils Relative 03/27/2015 0   Final  . Basophils Absolute 03/27/2015 0.0  0.0 - 0.1 K/uL Final  Dg Chest 2 View  04/04/2015  CLINICAL DATA:  Worsening cough, congestion and shortness of breath. EXAM: CHEST  2 VIEW COMPARISON:  03/30/2015 FINDINGS: Chronic elevation of the right hemidiaphragm. No focal airspace disease or pulmonary edema. Heart and mediastinum are stable and within normal limits. No large pleural effusions. Again noted are compression deformities in the mid thoracic spine. IMPRESSION: No active cardiopulmonary disease. Electronically Signed   By: Markus Daft M.D.   On: 04/04/2015 08:54   Dg Chest 2 View  03/30/2015  CLINICAL DATA:  History of lung cancer and COPD. Shortness of breath. EXAM: CHEST  2 VIEW COMPARISON:  03/27/2015 and 02/27/2015 FINDINGS: Lungs are adequately inflated with mild interval improvement of hazy lingular airspace density. No evidence of effusion or pneumothorax. Mild stable cardiomegaly. Calcified plaque over the thoracic aorta. Stable known mild compression of several mid to upper thoracic vertebral bodies. IMPRESSION: Slight interval improvement in hazy lingular airspace density. Stable known thoracic spine compression fractures. Stable cardiomegaly. Electronically Signed   By: Marin Olp M.D.   On: 03/30/2015 14:52   Dg Chest 2 View  03/27/2015  CLINICAL DATA:  Worsening shortness of  breath, cough, chest congestion and history of lung cancer. EXAM: CHEST  2 VIEW COMPARISON:  02/27/2015 FINDINGS: The heart is enlarged. There is atherosclerosis an unfolding of the aorta. Mild chronic density in the  lingula as seen previously. Remainder of the lungs are clear. No effusions. There are partial compression fractures in the upper to mid thoracic spine affecting T3, T5 and T6 as seen previously. No evidence of progression or new fracture. IMPRESSION: Mild persistent patchy density in the lingula consistent with the patient's known disease. The remainder of the chest is clear. Atherosclerosis of the aorta. Compression fractures T3, T5 and T6 as seen previously Electronically Signed   By: Nelson Chimes M.D.   On: 03/27/2015 10:14   Ct Head Wo Contrast  03/28/2015  CLINICAL DATA:  Confusion. Altered mental status. Dizziness. Lung cancer. EXAM: CT HEAD WITHOUT CONTRAST TECHNIQUE: Contiguous axial images were obtained from the base of the skull through the vertex without intravenous contrast. COMPARISON:  Multiple exams, including 02/28/2015 FINDINGS: Central pontine hypodensity is partially attributable to to streak artifact and partially attributable to chronic ischemic microvascular white matter disease. Otherwise, the brainstem, cerebellum, cerebral peduncles, thalami, basal ganglia, basilar cisterns, and ventricular system appear within normal limits. Periventricular white matter and corona radiata hypodensities favor chronic ischemic microvascular white matter disease. No intracranial hemorrhage, mass lesion, or acute CVA. IMPRESSION: 1. No acute intracranial findings to explain the patient's altered mental status and confusion. 2. Evidence of chronic is a ischemic microvascular white matter disease involving the periventricular white matter and the pons, not appreciably changed from recent prior exams. Electronically Signed   By: Van Clines M.D.   On: 03/28/2015 13:24   Ct Angio Chest Pe  W/cm &/or Wo Cm  03/30/2015  CLINICAL DATA:  Fatigue and shortness of breath. The patient started radiation therapy for lung cancer 6 days ago. EXAM: CT ANGIOGRAPHY CHEST WITH CONTRAST TECHNIQUE: Multidetector CT imaging of the chest was performed using the standard protocol during bolus administration of intravenous contrast. Multiplanar CT image reconstructions and MIPs were obtained to evaluate the vascular anatomy. CONTRAST:  139m OMNIPAQUE IOHEXOL 350 MG/ML SOLN COMPARISON:  Chest radiographs obtained earlier today. PET-CT dated 03/13/2015. Chest CT dated 02/25/2015. FINDINGS: Mediastinum/Lymph Nodes: Normally opacified pulmonary arteries with no pulmonary arterial filling defects seen. A mildly prominent precarinal lymph node has not changed significantly, with a short axis diameter of 12 mm on image number 26. This did not have increased activity on the recent PET-CT. There is also mild concentric soft tissue thickening around the proximal left upper lobe bronchus in the left hilum, measuring 1.3 x 1.2 cm on image number 28. This is causing moderate to marked concentric bronchial narrowing. There are similar changes causing moderate concentric narrowing of the proximal lingular bronchus. Lungs/Pleura: Mild further decrease in lingular atelectasis. No lung nodules. No pleural fluid. Upper abdomen: No acute findings. Musculoskeletal: Stable old T5-T6 vertebral compression deformities. Less prominent associated sclerosis. Review of the MIP images confirms the above findings. IMPRESSION: 1. No pulmonary emboli or acute abnormality. 2. Soft tissue thickening in the left hilum, causing moderate to marked concentric luminal narrowing involving the proximal left upper lobe bronchus and moderate concentric luminal narrowing involving the proximal lingular bronchus. This may represent the patient's recently diagnosed malignancy. 3. Mild further improvement in lingular atelectasis. 4. Stable mildly prominent  precarinal lymph no without hypermetabolism. Electronically Signed   By: SClaudie ReveringM.D.   On: 03/30/2015 18:33   Nm Pet Image Initial (pi) Skull Base To Thigh  03/13/2015  CLINICAL DATA:  Initial treatment strategy for left lung squamous cell carcinoma on bronchoscopic brushing. EXAM: NUCLEAR MEDICINE PET SKULL BASE TO THIGH TECHNIQUE: 9.4 mCi F-18 FDG was  injected intravenously. Full-ring PET imaging was performed from the skull base to thigh after the radiotracer. CT data was obtained and used for attenuation correction and anatomic localization. FASTING BLOOD GLUCOSE:  Value: 82 mg/dl COMPARISON:  Chest CT on 02/25/2015 FINDINGS: NECK No hypermetabolic lymph nodes in the neck. CHEST Previously seen airspace opacity in the lingula shows interval improvement since prior study. This shows low-grade metabolic activity with SUV max of 3.0, which is suspicious for postobstructive pneumonitis or other infectious or inflammatory process. A solitary focus of hypermetabolic activity is seen in the left hilum on image 56 of series 3 which has SUV max of 4.1. No corresponding mass or adenopathy is visualized by CT. No other hypermetabolic hilar or mediastinal masses or lymph nodes are identified. ABDOMEN/PELVIS No abnormal hypermetabolic activity within the liver, pancreas, adrenal glands, or spleen. No hypermetabolic lymph nodes in the abdomen or pelvis. Focal hypermetabolic activity is seen at the anorectal junction corresponding with asymmetric soft tissue prominence. This has SUV max of 12.4. Anorectal carcinoma cannot be excluded. Cholelithiasis is noted, without evidence of cholecystitis. Sigmoid diverticulosis is also demonstrated, without evidence of diverticulitis. SKELETON No focal hypermetabolic activity to suggest skeletal metastasis. IMPRESSION: Interval improvement in lingular airspace disease with low-grade metabolic activity. This is likely due to improving postobstructive pneumonitis. Small solitary  focus of hypermetabolic activity in left hilum with SUV max of 4.1, without evidence of corresponding mass by CT. 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 No other metastatic disease identified. Hypermetabolic activity at the anorectal junction corresponding with asymmetric soft tissue prominence PET-CT. Anorectal carcinoma cannot be excluded. Recommend correlation with rectal exam and possible proctoscopy. Electronically Signed   By: Earle Gell M.D.   On: 03/13/2015 09:43     Assessment/Plan   ICD-9-CM ICD-10-CM   1. Acute exacerbation of chronic obstructive pulmonary disease (COPD) ([place]) 491.21 J44.1   2. Chronic respiratory failure with hypoxia (HCC) 518.83 J96.11    799.02    3. Paroxysmal atrial fibrillation (HCC) 427.31 I48.0   4. Squamous cell lung cancer, left (HCC) 162.9 C34.92   5. Chronic diastolic (congestive) heart failure (HCC) 428.32 I50.32    428.0    6. Chronic pain syndrome 338.4 G89.4   7. Major depressive disorder, recurrent, in full remission with anxious distress ([place]) 296.36 F33.42   8. Primary osteoarthritis involving multiple joints 715.09 M15.0   9. Essential hypertension 401.9 I10   10. Gastroesophageal reflux disease without esophagitis 530.81 K21.9   11. HLD (hyperlipidemia) 272.4 E78.5     Cont current meds as ordered. Will complete levaquin on 3/4th  F/u with Rad Onc as scheduled for palliative XRT  Cont Gas O2 ATC  Pain control  Palliative care to f/u  PT/OT as ordered  GOAL: short term rehab and d/c home when medically appropriate. Palliative care. Communicated with pt and nursing.  Will follow  Reno Clasby S. Perlie Gold  [facility] [address] [place], [place] [address] [phone] Cell (Monday-Friday 8 AM - 5 PM) [phone] After 5 PM and follow prompts

## 2015-04-10 ENCOUNTER — Ambulatory Visit: Payer: Medicare Other | Admitting: Radiation Oncology

## 2015-04-10 ENCOUNTER — Ambulatory Visit
Admission: RE | Admit: 2015-04-10 | Discharge: 2015-04-10 | Disposition: A | Discharge status: Home / Self Care | Payer: Medicare Other | Source: Ambulatory Visit | Attending: Radiation Oncology | Admitting: Radiation Oncology

## 2015-04-10 DIAGNOSIS — Z510 Encounter for antineoplastic radiation therapy: Secondary | ICD-10-CM | POA: Diagnosis not present

## 2015-04-11 ENCOUNTER — Telehealth: Payer: Self-pay | Admitting: General Practice

## 2015-04-11 ENCOUNTER — Ambulatory Visit: Payer: Medicare Other | Admitting: Radiation Oncology

## 2015-04-11 LAB — AFB CULTURE WITH SMEAR (NOT AT ARMC): ACID FAST SMEAR: NONE SEEN

## 2015-04-11 NOTE — Telephone Encounter (Signed)
Called to follow up with patient to see how she was doing, and also if she wanted to schedule a counseling appointment.

## 2015-04-11 NOTE — Telephone Encounter (Signed)
Left a voicemail to follow up about scheduling first counseling appointment. Have not heard back from client about scheduling.

## 2015-04-12 ENCOUNTER — Ambulatory Visit
Admission: RE | Admit: 2015-04-12 | Discharge: 2015-04-12 | Disposition: A | Discharge status: Home / Self Care | Payer: Medicare Other | Source: Ambulatory Visit | Attending: Radiation Oncology | Admitting: Radiation Oncology

## 2015-04-12 ENCOUNTER — Telehealth: Payer: Self-pay

## 2015-04-12 ENCOUNTER — Ambulatory Visit: Payer: Medicare Other | Admitting: Radiation Oncology

## 2015-04-12 DIAGNOSIS — Z510 Encounter for antineoplastic radiation therapy: Secondary | ICD-10-CM | POA: Diagnosis not present

## 2015-04-12 NOTE — Telephone Encounter (Signed)
I called Kelsey Garcia's rehab facility regarding her missed radiation appointment yesterday. I spoke with her nurse who stated that she did not want to come yesterday, but she states she does plan to come today. I have informed them of the appointment time of 3:45 and they state that transportation has already been arranged.

## 2015-04-15 ENCOUNTER — Ambulatory Visit
Admission: RE | Admit: 2015-04-15 | Discharge: 2015-04-15 | Disposition: A | Discharge status: Home / Self Care | Payer: Medicare Other | Source: Ambulatory Visit | Attending: Radiation Oncology | Admitting: Radiation Oncology

## 2015-04-15 ENCOUNTER — Telehealth: Payer: Self-pay

## 2015-04-15 ENCOUNTER — Encounter: Payer: Self-pay | Admitting: Radiation Oncology

## 2015-04-15 ENCOUNTER — Other Ambulatory Visit: Payer: Self-pay | Admitting: Radiation Oncology

## 2015-04-15 VITALS — BP 113/75 | HR 101 | Temp 98.3°F | Ht 65.0 in | Wt 174.4 lb

## 2015-04-15 DIAGNOSIS — C3410 Malignant neoplasm of upper lobe, unspecified bronchus or lung: Secondary | ICD-10-CM

## 2015-04-15 DIAGNOSIS — Z510 Encounter for antineoplastic radiation therapy: Secondary | ICD-10-CM | POA: Diagnosis not present

## 2015-04-15 NOTE — Progress Notes (Signed)
Ms. Hunkins is here for her last treatment of radiation to her Left Lung. She admits to fatigue. She does have some nausea, and says she threw up lunch yesterday and today. She reports she is eating well at the rehab facility where she is staying, and she does plan to be discharged from there next week. She denies diarrhea at this time. She reports her shortness of breath at rest and activity has not changed since beginning treatment. She has several questions regarding her treatment and follow up.  BP 113/75 mmHg  Pulse 101  Temp(Src) 98.3 F (36.8 C)  Ht '5\' 5"'$  (1.651 m)  Wt 174 lb 6.4 oz (79.107 kg)  BMI 29.02 kg/m2  SpO2 99%   Wt Readings from Last 3 Encounters:  04/15/15 174 lb 6.4 oz (79.107 kg)  04/09/15 170 lb 3.2 oz (77.202 kg)  04/08/15 170 lb 2 oz (77.168 kg)

## 2015-04-15 NOTE — Progress Notes (Signed)
   Weekly Management Note  outpatient    ICD-9-CM ICD-10-CM   1. Cancer of lingula of lung (HCC) 162.3 C34.10     Completed Radiotherapy. Total Dose: 50 Gy   Narrative:  The patient presents for routine under treatment assessment on last day of radiotherapy.  CBCT/MVCT images/Port film x-rays were reviewed.  The chart was checked. No acute effects other than fatigue. Still in a nursing facility.  Has nausea.  Physical Findings:    Vitals - 1 value per visit 04/16/9442  SYSTOLIC 619  DIASTOLIC 75  Pulse 012  Temperature 98.3  Respirations   Weight (lb) 174.4  Height '5\' 5"'$   BMI 29.02  VISIT REPORT    NAD, [place] O2, in WC.  Impression:  The patient has tolerated radiotherapy.  Plan:  Routine follow-up in one month.  Anderson Malta, RN will let nursing home know that her nausea is not due to RT and we will defer to the physician on site for management. ________________________________   Eppie Gibson, M.D.

## 2015-04-15 NOTE — Telephone Encounter (Signed)
I called and spoke with Ms. Diekman's nurse at [facility]. He was not aware of her nausea and vomiting yesterday and today. I informed him that the nausea was not related to the radiation she received here, and if she experienced any more, the physician at the facility would need to be notified. He voiced his understanding.

## 2015-04-16 ENCOUNTER — Ambulatory Visit: Payer: Medicare Other | Admitting: Radiation Oncology

## 2015-04-18 ENCOUNTER — Other Ambulatory Visit: Payer: Self-pay

## 2015-04-18 MED ORDER — DIAZEPAM 2 MG PO TABS: ORAL_TABLET | ORAL | Status: DC

## 2015-04-18 NOTE — Telephone Encounter (Signed)
RX faxed to [facility] @ [phone], phone number [phone] 

## 2015-04-26 LAB — CBC AND DIFFERENTIAL
HEMATOCRIT: 31 % — AB (ref 36–46)
Hemoglobin: 10 g/dL — AB (ref 12.0–16.0)
PLATELETS: 179 10*3/uL (ref 150–399)
WBC: 7.4 10*3/mL

## 2015-04-26 LAB — BASIC METABOLIC PANEL
BUN: 15 mg/dL (ref 4–21)
CREATININE: 0.5 mg/dL (ref 0.5–1.1)
Glucose: 92 mg/dL
Potassium: 2.8 mmol/L — AB (ref 3.4–5.3)
Sodium: 145 mmol/L (ref 137–147)

## 2015-04-26 LAB — HEPATIC FUNCTION PANEL
ALT: 12 U/L (ref 7–35)
AST: 8 U/L — AB (ref 13–35)
Alkaline Phosphatase: 57 U/L (ref 25–125)
BILIRUBIN, TOTAL: 0.3 mg/dL

## 2015-04-30 ENCOUNTER — Other Ambulatory Visit: Payer: Self-pay | Admitting: *Deleted

## 2015-04-30 DIAGNOSIS — J441 Chronic obstructive pulmonary disease with (acute) exacerbation: Secondary | ICD-10-CM

## 2015-04-30 NOTE — Patient Outreach (Signed)
[facility]) Care Management  04/30/2015  KALEIA LONGHI [birth year] [id number]   Assessment: Care coordination Called and spoke to patient briefly. Patient verified that she is still residing at [facility] and hoping to be able to go back home soon. Referral to [place] worker done to follow-up patient while at skilled nursing facility and to notify care management coordinator of patient's discharge from skilled nursing facility to follow- up appropriately.   Plan: Will await for [facility] social worker's advise of patient's SNF (Skilled Nursing facility) discharge. Will follow-up patient after discharge from [facility].   Twania Bujak A. Amantha Sklar, BSN, RN-BC [facility] Management Coordinator Cell: [phone]

## 2015-05-02 ENCOUNTER — Non-Acute Institutional Stay (SKILLED_NURSING_FACILITY): Payer: Medicare Other | Admitting: Adult Health

## 2015-05-02 ENCOUNTER — Encounter: Payer: Self-pay | Admitting: Adult Health

## 2015-05-02 DIAGNOSIS — I480 Paroxysmal atrial fibrillation: Secondary | ICD-10-CM | POA: Diagnosis not present

## 2015-05-02 DIAGNOSIS — C3492 Malignant neoplasm of unspecified part of left bronchus or lung: Secondary | ICD-10-CM | POA: Diagnosis not present

## 2015-05-02 DIAGNOSIS — J441 Chronic obstructive pulmonary disease with (acute) exacerbation: Secondary | ICD-10-CM | POA: Diagnosis not present

## 2015-05-02 DIAGNOSIS — I5033 Acute on chronic diastolic (congestive) heart failure: Secondary | ICD-10-CM

## 2015-05-02 LAB — POTASSIUM: POTASSIUM: 4.5 mmol/L

## 2015-05-02 MED ORDER — HYDROCODONE-HOMATROPINE 5-1.5 MG/5ML PO SYRP: 2.5000 mL | ORAL_SOLUTION | Freq: Two times a day (BID) | ORAL | Status: DC | PRN

## 2015-05-02 MED ORDER — HYDROCODONE-ACETAMINOPHEN 10-325 MG PO TABS: 1.0000 | ORAL_TABLET | ORAL | Status: DC | PRN

## 2015-05-02 MED ORDER — DIAZEPAM 2 MG PO TABS: ORAL_TABLET | ORAL | Status: DC

## 2015-05-02 NOTE — Progress Notes (Signed)
Patient ID: Kelsey Garcia, female   DOB: 15-Aug-1945, 70 y.o.   MRN: [id number]    Facility: [facility]       Allergies  Allergen Reactions  . Ace Inhibitors Cough  . Codeine Swelling    Face and lips swelling  Patient report Can tolerate hydrocodone  . Pseudoeph-Doxylamine-Dm-Apap Itching  . Diphenhydramine Hcl Other (See Comments)    "feels like my skin is crawling, and legs twitches"  . Erythromycin Diarrhea  . Nsaids Nausea And Vomiting  . Nyquil [Pseudoeph-Doxylamine-Dm-Apap] Itching  . Tramadol Hcl Other (See Comments)    stomach pain, hallucinations  . Fosamax [Alendronate] Other (See Comments)    Nausea and abdominal bloating.   . Tomato Other (See Comments)    Stomach swells  . Orange Fruit [Citrus] Rash  . Pseudoephedrine Palpitations    Chief Complaint  Patient presents with  . Discharge Note    Discharge from facility    HPI:  She was admitted to this facility from 04-07-15 through 05-02-15. She will be discharged to home with home health for pt/ot/rn. She will not need dme. She will need her prescriptions written and will need to follow up with her pcp. She had been hospitalized for her copd exacerbation.    Past Medical History  Diagnosis Date  . Atrial fibrillation ([place])   . Hypertension   . Insomnia   . Nonischemic cardiomyopathy ([employer])   . Hyperlipidemia   . Anxiety   . Cholelithiasis   . Insomnia   . Mitral regurgitation     noted 2010  . H/O epistaxis   . Rhinitis, allergic   . CHF (congestive heart failure) ([place])   . Pneumonia     "several times w/exacerbations of the COPD; nothing in the last year" (07/15/2012)  . COPD (chronic obstructive pulmonary disease) ([place])     as of 7/13 on 2-3L, pfts 10/2008 with mod obstruction  . Chronic bronchitis with COPD (chronic obstructive pulmonary disease) ([place])   . Shortness of breath     "all the time right now" (07/15/2012)  . GERD (gastroesophageal reflux disease)   . ENIDPOEU(235.3)     "weekly"  (07/15/2012)  . Migraines     "weekly for awhile; cleared up as I got older" (07/15/2012)  . DJD (degenerative joint disease)   . Arthritis     "all over" (07/15/2012)  . OCD (obsessive compulsive disorder)   . OCD (obsessive compulsive disorder)   . Depression     h/o SI; "last time I was really serious about it was ~ 1997" (07/15/2012)  . Thoracic vertebral fracture ([place]) 11/03/2014  . Primary lung cancer [facility])     Past Surgical History  Procedure Laterality Date  . Tubal ligation  1972  . Cardioversion  2003; 07/2003  . Video bronchoscopy Bilateral 02/27/2015    Procedure: VIDEO BRONCHOSCOPY WITH FLUORO;  Surgeon: Chesley Mires, MD;  Location: [facility] ENDOSCOPY;  Service: Cardiopulmonary;  Laterality: Bilateral;    VITAL SIGNS BP 112/60 mmHg  Pulse 79  Temp(Src) 97.5 F (36.4 C) (Oral)  Resp 20  Patient's Medications  New Prescriptions   No medications on file  Previous Medications   ARFORMOTEROL (BROVANA) 15 MCG/2ML NEBU    Take 2 mLs (15 mcg total) by nebulization 2 (two) times daily.   ASPIRIN 325 MG EC TABLET    Take 650 mg by mouth daily with breakfast.    BUDESONIDE (PULMICORT) 0.5 MG/2ML NEBULIZER SOLUTION    Take 0.5 mg by nebulization 2 (  two) times daily.   BUSPIRONE (BUSPAR) 10 MG TABLET    Take 10 mg by mouth 3 (three) times daily.   DIAZEPAM (VALIUM) 2 MG TABLET    2 by mouth every 6 hours as needed   DILTIAZEM (CARDIZEM) 120 MG TABLET    Take 1 tablet (120 mg total) by mouth every 8 (eight) hours.   FLUOXETINE (PROZAC) 20 MG CAPSULE    Take 3 capsules by mouth once daily   FUROSEMIDE (LASIX) 20 MG TABLET    Take 40 mg by mouth daily.    GUAIFENESIN (MUCINEX) 600 MG 12 HR TABLET    Take 600 mg by mouth 2 (two) times daily as needed for cough.   HYDROCODONE-ACETAMINOPHEN (NORCO) 10-325 MG TABLET    Take 1 tablet by mouth every 6 (six) hours as needed for moderate pain (Pain or Dyspnea).   HYDROCODONE-HOMATROPINE (HYCODAN) 5-1.5 MG/5ML SYRUP    Take 2.5 mLs by mouth 2 (two)  times daily as needed for cough.   IPRATROPIUM-ALBUTEROL (DUONEB) 0.5-2.5 (3) MG/3ML SOLN    Take 3 mLs by nebulization 3 (three) times daily.   METOPROLOL SUCCINATE (TOPROL-XL) 25 MG 24 HR TABLET    Take 1 tablet (25 mg total) by mouth daily.   OXYGEN    Inhale into the lungs. 2-3L/min   PANTOPRAZOLE (PROTONIX) 40 MG TABLET    TAKE ONE TABLET BY MOUTH TWICE DAILY   POTASSIUM CHLORIDE SA (KLOR-CON M20) 20 MEQ TABLET    Take 20 mEq by mouth once.   PREDNISONE (DELTASONE) 5 MG TABLET    Take 5 mg by mouth daily.   SPIRIVA HANDIHALER 18 MCG INHALATION CAPSULE    PLACE ONE CAPSULE INTO INHALER AND INHALE DAILY   VENTOLIN HFA 108 (90 BASE) MCG/ACT INHALER    INHALE ONE TO TWO PUFFS BY MOUTH EVERY 6 HOURS AS NEEDED FOR WHEEZING  Modified Medications   No medications on file  Discontinued Medications     SIGNIFICANT DIAGNOSTIC EXAMS  03-08-15: 2-d echo: Left ventricle: The cavity size was normal. Wall thickness was normal. Systolic function was at the lower limits of normal. The estimated ejection fraction was in the range of 50% to 55%. Although no diagnostic regional wall motion abnormality was identified, this possibility cannot be completely excluded on the basis of this study. - Left atrium: The atrium was mildly dilated. - Right atrium: The atrium was mildly dilated. - Pulmonary arteries: PA peak pressure: 32 mm Hg (S).  03-13-15: PET scan: Interval improvement in lingular airspace disease with low-grade metabolic activity. This is likely due to improving postobstructive pneumonitis. Small solitary focus of hypermetabolic activity in left hilum with SUV max of 4.1, without evidence of corresponding mass by CT. 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 No other metastatic disease identified.  Hypermetabolic activity at the anorectal junction corresponding with asymmetric soft tissue prominence PET-CT. Anorectal carcinoma cannot be excluded.  Recommend correlation with rectal exam and possible proctoscopy.  03-28-15: ct of head: 1. No acute intracranial findings to explain the patient's altered mental status and confusion. 2. Evidence of chronic is a ischemic microvascular white matter disease involving the periventricular white matter and the pons, not appreciably changed from recent prior exams.  03-30-15: chest x-ray: Slight interval improvement in hazy lingular airspace density.  Stable known thoracic spine compression fractures.  Stable cardiomegaly.  03-30-15: ct angio of chest: 1. No pulmonary emboli or acute abnormality. 2. Soft tissue thickening in the left hilum, causing  moderate to marked concentric luminal narrowing involving the proximal left upper lobe bronchus and moderate concentric luminal narrowing involving the proximal lingular bronchus. This may represent the patient's recently diagnosed malignancy. 3. Mild further improvement in lingular atelectasis. 4. Stable mildly prominent precarinal lymph no without hypermetabolism.  04-04-15: chest x-ray; No active cardiopulmonary disease.    LABS REVIEWED:   03-27-15: wbc 4.5; hgb 9.8; hct 33.3; mcv 99.1; plt 308; glucose 128; bun 12; creat 0.64; k+ 5.3; na++144; liver normal albumin 3.2 03-30-15: tsh 0.058 04-04-15: wbc 5.2; hgb 10.2; hct 34.9; mcv 99.4; plt 222; glucose 120; bun 14; creat 0.44; k+ 3.4; na++144; liver normal albumin 3.2 04-05-15: wbc 5.3; hgb 9.7; hct 32.2 ;mcv 97.9; plt 225; glucose 163; bun 17; creat 0.49; k+ 4.3; na++139           Review of Systems  Constitutional: Negative for malaise/fatigue.  Respiratory: Negative for cough and shortness of breath.   Cardiovascular: Negative for chest pain, palpitations and leg swelling.  Gastrointestinal: Negative for heartburn, abdominal pain and constipation.  Musculoskeletal: Negative for myalgias and joint pain.  Skin: Negative.   Neurological: Negative for headaches.  Psychiatric/Behavioral: The  patient is not nervous/anxious.      Physical Exam  Constitutional: She is oriented to person, place, and time. She appears well-developed and well-nourished. No distress.  Neck: Neck supple. No JVD present.  Cardiovascular: Normal rate, regular rhythm and intact distal pulses.   Respiratory: . No respiratory distress. diminished sounds throughout  GI: Soft. Bowel sounds are normal. She exhibits no distension. There is no tenderness.  Musculoskeletal: Normal range of motion. She exhibits no edema.  Neurological: She is alert and oriented to person, place, and time.  Skin: Skin is warm and dry. She is not diaphoretic.       ASSESSMENT/ PLAN:  Will discharge to home with home health for pt/ot/rn to evaluate and treat as indicated for gait balance strength and adl training and med management. She does not need dme. Her prescriptions have been written for a 30 day supply of medications with #30 valium 2 mg tabs; hycodan syrup 5-1.5 mg 60 mL; #30 vicodin 10/325 mg tabs. The facility is aware to setup a follow up for her within the next 1-2 weeks.    Time spent with patient  40  minutes >50% time spent counseling; reviewing medical record; tests; labs; and developing future plan of care;            Ok Edwards NP [facility]  Contact [phone] Monday through Friday 8am- 5pm  After hours call [phone]

## 2015-05-03 NOTE — Progress Notes (Signed)
  Radiation Oncology         (336) [phone] ________________________________  Name: Kelsey Garcia MRN: [id number]  Date: 04/15/2015  DOB: 03-12-1945  End of Treatment Note  Diagnosis:      ICD-9-CM ICD-10-CM   1. 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   She experienced mild fatigue, nausea, and vomiting.   Plan: The patient has completed radiation treatment. The patient will return to radiation oncology clinic for routine followup in one month. I advised them to call or return sooner if they have any questions or concerns related to their recovery or treatment.  -----------------------------------  Eppie Gibson, MD   This document serves as a record of services personally performed by Eppie Gibson, MD. It was created on her behalf by Arlyce Harman, a trained medical scribe. The creation of this record is based on the scribe's personal observations and the provider's statements to them. This document has been checked and approved by the attending provider.

## 2015-05-06 ENCOUNTER — Other Ambulatory Visit: Payer: Self-pay | Admitting: *Deleted

## 2015-05-06 NOTE — Patient Outreach (Signed)
[place] [facility]) Care Management  05/06/2015  Kelsey Garcia July 14, 1945 [id number]   Assessment: Transition of Care- initial call Notification received that patient was discharged from [facility] on 3/23 to home. Call placed and spoke to a lady who identified herself as patient's sister. She notified care management coordinator that patient is out to the doctor. Patient's sister states to call at another time but did not mention the best time to call back.    Plan: Will schedule patient for next outreach call.  Plato Alspaugh A. Kamariya Blevens, BSN, RN-BC [place] Management Coordinator Cell: [phone]

## 2015-05-07 ENCOUNTER — Encounter: Payer: Self-pay | Admitting: *Deleted

## 2015-05-07 ENCOUNTER — Other Ambulatory Visit: Payer: Self-pay | Admitting: *Deleted

## 2015-05-07 NOTE — Patient Outreach (Signed)
[place] [facility]) Care Management  05/07/2015  Kelsey Garcia Apr 25, 1945 [id number]   Assessment: Transition of care Call placed and spoke with patient who states that she is "glad to be home" as she had recently discharged from [facility]. Care management coordinator introduced self and explained purpose of the call. Patient expressed interest of being involved with the program. Patient reports having recently  completed her radiation treatment for lung cancer . She states that she continues to have cough and about same amount of whitish phlegm. She states having on and off shortness of breath and uses rescue inhaler with relief. Patient also uses maintenance breathing treatments and denies any problem using it. Patient has home oxygen set at 2 liters per minute and uses continuously as stated.  Patient mentions no swelling to lower extremities. She reports having good appetite and sleeps fairly. Patient denies any fever or chest pain/ discomfort. According to patient, she checks weight daily and records. Her weight today is 171 pounds.  Patient informs care management coordinator that her daughter and granddaughter lives with her in her house and assist her with what she is unable to do for herself. Patient shares that she follows low salt, heart healthy diet and daughter usually cooks and prepares the food.   Home Health physical therapy, nurse and occupational therapy will be arranged tomorrow per patient's report. Patient shares that she obtains her medications from [facility]) and she has all her supplies. She manages her medications and takes them on her own as stated.    [name] has a follow-up appointment scheduled with primary care provider Bryson Ha [place], [place]) on 4/3 and with a scheduled visit with oncologist (Dr. Isidore Moos) on 4/7.  According to patient, transportation to providers' office is being provided by Medicaid transport through [facility].   Patient denies of any urgent needs or concerns at this time. Home visit was scheduled for next week as arranged with patient's daughter since patient's phone had low battery charge. Patient encouraged to call [facility] management coordinator or 24-hour nurse line if needed. Contact information with patient.   Plan: Initial home visit on 05/15/15.  Omarri Eich A. Vercie Pokorny, BSN, RN-BC [place] Management Coordinator Cell: [phone]

## 2015-05-15 ENCOUNTER — Encounter: Payer: Self-pay | Admitting: *Deleted

## 2015-05-15 ENCOUNTER — Other Ambulatory Visit: Payer: Self-pay | Admitting: *Deleted

## 2015-05-15 NOTE — Patient Outreach (Signed)
[place] [facility]) Care Management   05/15/2015  KAELYNN IGO 04-19-1945 [id number]  MARYKATHERINE SHERWOOD is an 70 y.o. female  Subjective: Patient reports "doing good". Some days feeling stronger, with a lot of energy and other days feeling weak.  Patient verbalized chronic pain to right shoulder at 5 out of 10 and took [place] for pain.  Objective: BP 100/60 mmHg  Pulse 64  Resp 16  SpO2 95%  Review of Systems  Constitutional: Positive for weight loss.       History of weight loss related to lung cancer  HENT: Negative.   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 Negative for wheezing.        Lung sounds diminished to bases otherwise clear Respirations even and unlabored at rest ocassional cough with cloudy phlegm not more than usual per patient Home oxygen at 2 liters per minute used continuously  Shortness of breath on exertion and sometimes at rest as stated  Cardiovascular: Negative for chest pain.       Very slight swelling (trace) to left lower extremity Regular rate and rhythm  Gastrointestinal: Positive for heartburn. Negative for abdominal pain, diarrhea and constipation.       Obese Abdomen soft, non-tender Bowel sounds present  Genitourinary: Positive for frequency.  Musculoskeletal: Positive for joint pain. Negative for falls.       Chronic right shoulder pain  Skin:       Bruised spots to bilateral upper extremities  Neurological: Positive for tremors.       Tremors to bilateral upper and lower extremities  Endo/Heme/Allergies: Bruises/bleeds easily.  Psychiatric/Behavioral: The patient is nervous/anxious.        History of major depression with anxiety    Physical Exam  Encounter Medications:   Outpatient Encounter Prescriptions as of 05/15/2015  Medication Sig Note  . arformoterol (BROVANA) 15 MCG/2ML NEBU Take 2 mLs (15 mcg total) by  nebulization 2 (two) times daily.   Marland Kitchen aspirin 325 MG EC tablet Take 650 mg by mouth daily with breakfast.    . budesonide (PULMICORT) 0.5 MG/2ML nebulizer solution Take 0.5 mg by nebulization 2 (two) times daily.   . busPIRone (BUSPAR) 10 MG tablet Take 10 mg by mouth 3 (three) times daily. 08/29/2014: .  . diazepam (VALIUM) 2 MG tablet 1 by mouth every 6 hours as needed   . diltiazem (CARDIZEM) 120 MG tablet Take 1 tablet (120 mg total) by mouth every 8 (eight) hours.   Marland Kitchen FLUoxetine (PROZAC) 20 MG capsule Take 3 capsules by mouth once daily   . furosemide (LASIX) 20 MG tablet Take 40 mg by mouth daily.    Marland Kitchen guaiFENesin (MUCINEX) 600 MG 12 hr tablet Take 600 mg by mouth 2 (two) times daily as needed for cough.   Marland Kitchen HYDROcodone-acetaminophen (NORCO) 10-325 MG tablet Take 1 tablet by mouth every 4 (four) hours as needed for moderate pain (Pain or Dyspnea).   Marland Kitchen HYDROcodone-homatropine (HYCODAN) 5-1.5 MG/5ML syrup Take 2.5 mLs by mouth 2 (two) times daily as needed for cough.   Marland Kitchen ipratropium-albuterol (DUONEB) 0.5-2.5 (3) MG/3ML SOLN Take 3 mLs by nebulization 3 (three) times daily.   . metoprolol succinate (TOPROL-XL) 25 MG 24 hr tablet Take 1 tablet (25 mg total) by mouth daily.   . OXYGEN Inhale into the lungs. 2-3L/min   . pantoprazole (PROTONIX) 40 MG tablet TAKE ONE  TABLET BY MOUTH TWICE DAILY   . potassium chloride SA (KLOR-CON M20) 20 MEQ tablet Take 20 mEq by mouth once.   . predniSONE (DELTASONE) 5 MG tablet Take 5 mg by mouth daily.   Marland Kitchen SPIRIVA HANDIHALER 18 MCG inhalation capsule PLACE ONE CAPSULE INTO INHALER AND INHALE DAILY   . VENTOLIN HFA 108 (90 Base) MCG/ACT inhaler INHALE ONE TO TWO PUFFS BY MOUTH EVERY 6 HOURS AS NEEDED FOR WHEEZING    No facility-administered encounter medications on file as of 05/15/2015.    Functional Status:   In your present state of health, do you have any difficulty performing the following activities: 04/04/2015 03/30/2015  Hearing? N N  Vision? N N   Difficulty concentrating or making decisions? N N  Walking or climbing stairs? Y Y  Dressing or bathing? Y Y  Doing errands, shopping? N N    Fall/Depression Screening:    PHQ 2/9 Scores 05/07/2015 03/15/2015 07/12/2014 07/02/2014 05/09/2014 04/16/2014 01/22/2014  PHQ - 2 Score 0 6 0 '5 4 1 '$ 0  PHQ- 9 Score - 19 - 13 16 - -    Assessment:  Arrived at patient's two-storey home. Patient's room is located on the second floor where daughter Vania Rea) directed care management coordinator to meet patient. Two grandaughters were in the house as well. Patient had been seen by another community care management coordinator in the past. Patient noted with occasional cough and reports having cloudy mucus at times but not more than usual. Her home oxygen is set at 2 liters per minute and she uses it continuously. She states having on and off shortness of breath on exertion and at rest that is unpredictable. She reports using rescue inhaler (Ventolin) 5 times in the past week with relief. Patient mentions having good appetite and sleeps fairly. She denies chest pain and fever.  She reports checking weight daily but has not been recording. Explained and encouraged patient on how to weigh properly and record readings using THN notebook/ calendar. Her weight today is 170 pounds and has verbalized understanding of reporting to provider any weight gain of 3 pounds in a day or 5 pounds in a week. Reviewed COPD zone tool and COPD action plan with patient and she is aware to call provider for help if she develops signs and symptoms of COPD flare-up. THN COPD magnet is on refrigerator door.  Patient reports that she had recently completed 10 radiation treatments for lung cancer.    Patient admits that she has not arrange Home Health services as what was recommended on discharge and had refused care management coordinator's assistance to arrange for one. She verbalized being able to do it on her own and will call [facility] when she is  ready. Patient mentioned about plan to work on [place] in the future and was offered [place] work services to assist her with it, however,she states that pastor of their church will assist her when ready. She declined Advanced Directive form and information at this time.  Ms. Dimascio was seen on follow-up by primary care provider [place], [place]) on 4/3. Reviewed upcoming appointments with other providers listed on patient's calendar and emphasized importance of attending these scheduled appointments to see oncologist (Dr. Isidore Moos) on 4/7, [name] (pulmonologist) on 4/12 and [name] (cardiologist) on 4/18.She reports having transportation to providers' office through [facility] transport or use of [employer]. Patient verbalized that she will discuss with pulmonologist this coming visit regarding Brovana breathing treatment that is not covered  by insurance as well as Prednisone use.  Patient denies of any additional needs or concerns at this time. Patient agreed to transition of care next week. Patient was encouraged to call [facility] management coordinator or 24-hour nurse line as needed. Contact information with patient.   Plan: Transition of care call on 05/22/15.   THN CM Care Plan Problem One        Most Recent Value   Care Plan Problem One  at risk of readmission   Role Documenting the Problem One  Care Management [place] for Problem One  Active   THN Long Term Goal (31-90 days)  patient will not be readmitted in the next 31 days   THN Long Term Goal Start Date  05/15/15   Interventions for Problem One Long Term Goal  encourage to continue exercise and take rest periods at intervals,  encourage to adhere to diet restrictions,  encourage medication adherence,  encourage attending follow- up visits with providers   [facility] CM Short Term Goal #1 (0-30 days)  patient will weigh daily and record in the next 30 days   THN CM Short Term Goal #1 Start Date  05/15/15    Interventions for Short Term Goal #1  encourage patient to monitor weight daily and record using [facility]N notebook/ organizer,  instruct patient proper way of checking weight in the morning after using the bathroom, before getting dressed and before eating a meal,  review patient's notebook on visits to check progress,  provide praises for efforts exerted iin checking and recording weight   THN CM Short Term Goal #2 (0-30 days)  patient will attend scheduled follow-up appointments with provider (Pulmonologist, Cardiologist, PCP) within the next month   THN CM Short Term Goal #2 Start Date  05/15/15   Interventions for Short Term Goal #2  check with patient of her upcoming providers' appointments for follow-up,  encourage patient to use [facility] notebook/ calendar to list down scheduled appointments,  encourage patient to set-up/ schedule appointments with transportation ahead of time,  encourage to review calendar regularly to prevent missing appointments      Ruie Sendejo A. Danthony Kendrix, BSN, RN-BC [place] Management Coordinator Cell: [phone]

## 2015-05-16 ENCOUNTER — Encounter: Payer: Self-pay | Admitting: *Deleted

## 2015-05-17 ENCOUNTER — Encounter: Payer: Self-pay | Admitting: Radiation Oncology

## 2015-05-17 ENCOUNTER — Ambulatory Visit
Admission: RE | Admit: 2015-05-17 | Discharge: 2015-05-17 | Disposition: A | Discharge status: Home / Self Care | Payer: Medicare Other | Source: Ambulatory Visit | Attending: Radiation Oncology | Admitting: Radiation Oncology

## 2015-05-17 VITALS — BP 79/45 | HR 80 | Temp 98.0°F | Ht 65.0 in | Wt 177.4 lb

## 2015-05-17 DIAGNOSIS — C3410 Malignant neoplasm of upper lobe, unspecified bronchus or lung: Secondary | ICD-10-CM

## 2015-05-17 NOTE — Progress Notes (Signed)
Radiation Oncology         [phone] ________________________________  Name: Kelsey Garcia MRN: [id number]  Date: 05/17/2015  DOB: 10-13-1945  Follow-Up Visit Note  Outpatient  CC: Kelsey Garcia, [place]  Heath Lark, MD  Diagnosis and Prior Radiotherapy:    ICD-9-CM ICD-10-CM   1. Cancer of lingula of lung (HCC) 162.3 C34.10     Clinical T2 N0 Mx squamous cell carcinoma of the lung   Radiation treatment dates:  03/25/2015-04/15/2015  Site/dose: Left hilum / 50 Gy in 10 fractions  Narrative: Kelsey Garcia is here for follow up of radiation completed 04/15/2015 to her Left Hilium. She reports chronic pain in her Right Shoulder, which she rates a 7/10 today. She takes her pain medicine 3-4 times a day. She reports fatigue. She reports itching over her radiation site, she plans to begin using the sonafine cream she was given during radiation treatment. She is generally feeling well, but her BP is low today, 79/45. She states it has been this way since she finished treatment. She reports she does have a doctor aware of this, she also plans to see her pulmonary doctor, Dr. Elsworth Garcia next Tuesday to evaluate her BP. Her oxygen is also low today, 88% on 2 Liters of oxygen. She reports she does feel slightly short of breath at this time, but no more than usual. Still taking BP medications.                       ALLERGIES:  is allergic to ace inhibitors; codeine; pseudoeph-doxylamine-dm-apap; diphenhydramine hcl; erythromycin; nsaids; nyquil; tramadol hcl; fosamax; tomato; orange fruit; and pseudoephedrine.  Meds: Current Outpatient Prescriptions  Medication Sig Dispense Refill  . arformoterol (BROVANA) 15 MCG/2ML NEBU Take 2 mLs (15 mcg total) by nebulization 2 (two) times daily. 120 mL   . aspirin 325 MG EC tablet Take 650 mg by mouth daily with breakfast.     . budesonide (PULMICORT) 0.5 MG/2ML nebulizer solution Take 0.5 mg by nebulization 2 (two) times daily.    . busPIRone (BUSPAR) 10 MG tablet Take 10  mg by mouth 3 (three) times daily.    . diazepam (VALIUM) 2 MG tablet 1 by mouth every 6 hours as needed 30 tablet 0  . diltiazem (CARDIZEM) 120 MG tablet Take 1 tablet (120 mg total) by mouth every 8 (eight) hours. 90 tablet 0  . FLUoxetine (PROZAC) 20 MG capsule Take 3 capsules by mouth once daily    . furosemide (LASIX) 20 MG tablet Take 40 mg by mouth daily.     Marland Kitchen guaiFENesin (MUCINEX) 600 MG 12 hr tablet Take 600 mg by mouth 2 (two) times daily as needed for cough.    Marland Kitchen HYDROcodone-acetaminophen (NORCO) 10-325 MG tablet Take 1 tablet by mouth every 4 (four) hours as needed for moderate pain (Pain or Dyspnea). 30 tablet 0  . HYDROcodone-homatropine (HYCODAN) 5-1.5 MG/5ML syrup Take 2.5 mLs by mouth 2 (two) times daily as needed for cough. 60 mL 0  . ipratropium-albuterol (DUONEB) 0.5-2.5 (3) MG/3ML SOLN Take 3 mLs by nebulization 3 (three) times daily. 360 mL 3  . metoprolol succinate (TOPROL-XL) 25 MG 24 hr tablet Take 1 tablet (25 mg total) by mouth daily. 90 tablet 3  . OXYGEN Inhale into the lungs. 2-3L/min    . pantoprazole (PROTONIX) 40 MG tablet TAKE ONE TABLET BY MOUTH TWICE DAILY 60 tablet 5  . potassium chloride SA (KLOR-CON M20) 20 MEQ tablet Take 20 mEq by  mouth once.    Marland Kitchen SPIRIVA HANDIHALER 18 MCG inhalation capsule PLACE ONE CAPSULE INTO INHALER AND INHALE DAILY 90 capsule 0  . VENTOLIN HFA 108 (90 Base) MCG/ACT inhaler INHALE ONE TO TWO PUFFS BY MOUTH EVERY 6 HOURS AS NEEDED FOR WHEEZING 18 each 0  . predniSONE (DELTASONE) 5 MG tablet Take 5 mg by mouth daily. Reported on 05/17/2015     No current facility-administered medications for this encounter.    Physical Findings: The patient is in no acute distress. Patient is alert and oriented.  height is '5\' 5"'$  (1.651 m) and weight is 177 lb 6.4 oz (80.468 kg). Her temperature is 98 F (36.7 C). Her blood pressure is 79/45 and her pulse is 80. Her oxygen saturation is 88%. .    General:   no acute distress. Patient presents in a  wheelchair and on supplemental O2.  HEENT: Normocephalic, atraumatic Cardiovascular: Regular rate and rhythm  Respiratory: Clear to auscultation bilaterally, wheeze in right lower lobe    Lab Findings: Lab Results  Component Value Date   WBC 7.4 04/26/2015   HGB 10.0* 04/26/2015   HCT 31* 04/26/2015   MCV 97.9 04/05/2015   PLT 179 04/26/2015    Radiographic Findings: No results found.  Impression/Plan:  The patient is doing well on today's visit. She is recovering from the effects of radiation. We discussed the role of the survivorship program in her future care.   Follow up in 2 months with restaging CT scan and meeting with Kelsey Craze NP in survivorship. I will see her for future visits.  Meeting with her PCP/pulmonologist next week to discuss steroids and BP meds. Hypotensive today, but balancing rate control with blood pressure via medications in light of A Fib.  _____________________________________   Eppie Gibson, MD   This document serves as a record of services personally performed by Eppie Gibson, MD. It was created on her behalf by Derek Mound, a trained medical scribe. The creation of this record is based on the scribe's personal observations and the provider's statements to them. This document has been checked and approved by the attending provider.

## 2015-05-17 NOTE — Progress Notes (Signed)
[name] is here for follow up of radiation completed 04/15/2015 to her Left Hilium. She reports chronic pain in her Right Shoulder, which she rates a 7/10 today. She takes her pain medicine 3-4 times a day. She reports fatigue. She reports itching over her radiation site, she plans to begin using the sonafine cream she was given during radiation treatment. She is generally feeling well, but her BP is low today, 79/45. She states it has been this way since she finished treatment. She reports she does have a doctor aware of this, she also plans to see her pulmonary doctor, [name] next Tuesday to evaluate her BP. Her oxygen is also low today, 88% on 2 Liters of oxygen. She reports she does feel slightly short of breath at this time, but no more than usual.    BP 79/45 mmHg  Pulse 80  Temp(Src) 98 F (36.7 C)  Ht '5\' 5"'$  (1.651 m)  Wt 177 lb 6.4 oz (80.468 kg)  BMI 29.52 kg/m2  SpO2 88%   Wt Readings from Last 3 Encounters:  05/17/15 177 lb 6.4 oz (80.468 kg)  04/15/15 174 lb 6.4 oz (79.107 kg)  04/09/15 170 lb 3.2 oz (77.202 kg)

## 2015-05-21 ENCOUNTER — Telehealth: Payer: Self-pay | Admitting: *Deleted

## 2015-05-21 ENCOUNTER — Other Ambulatory Visit: Payer: Self-pay | Admitting: Adult Health

## 2015-05-21 DIAGNOSIS — C3492 Malignant neoplasm of unspecified part of left bronchus or lung: Secondary | ICD-10-CM

## 2015-05-21 DIAGNOSIS — C3410 Malignant neoplasm of upper lobe, unspecified bronchus or lung: Secondary | ICD-10-CM

## 2015-05-21 NOTE — Telephone Encounter (Signed)
Called patient to inform of Ct on 07-23-15 @ [facility] and her appt. With Mike Craze on 07-24-15 @ 2 pm , spoke with patient and she is aware of these appts.

## 2015-05-22 ENCOUNTER — Other Ambulatory Visit: Payer: Self-pay | Admitting: *Deleted

## 2015-05-22 ENCOUNTER — Encounter: Payer: Self-pay | Admitting: Adult Health

## 2015-05-22 ENCOUNTER — Ambulatory Visit (INDEPENDENT_AMBULATORY_CARE_PROVIDER_SITE_OTHER): Payer: Medicare Other | Admitting: Adult Health

## 2015-05-22 VITALS — BP 110/74 | HR 84 | Temp 98.3°F | Ht 65.0 in | Wt 174.0 lb

## 2015-05-22 DIAGNOSIS — J441 Chronic obstructive pulmonary disease with (acute) exacerbation: Secondary | ICD-10-CM

## 2015-05-22 DIAGNOSIS — C3490 Malignant neoplasm of unspecified part of unspecified bronchus or lung: Secondary | ICD-10-CM

## 2015-05-22 DIAGNOSIS — J9610 Chronic respiratory failure, unspecified whether with hypoxia or hypercapnia: Secondary | ICD-10-CM | POA: Insufficient documentation

## 2015-05-22 DIAGNOSIS — I5032 Chronic diastolic (congestive) heart failure: Secondary | ICD-10-CM | POA: Diagnosis not present

## 2015-05-22 DIAGNOSIS — J9611 Chronic respiratory failure with hypoxia: Secondary | ICD-10-CM

## 2015-05-22 MED ORDER — CIPROFLOXACIN HCL 750 MG PO TABS: 750.0000 mg | ORAL_TABLET | Freq: Two times a day (BID) | ORAL | Status: DC

## 2015-05-22 NOTE — Progress Notes (Signed)
   Subjective:    Patient ID: Kelsey Garcia, female    DOB: 07-Jul-1945, 70 y.o.   MRN: [id number]  HPI 55/ F, smoker presents for FU of COPD.  She has a h/o opiate abuse in the 8's. She is not prescribed codeine containing medications due to her addiction hisotry.    05/22/2015 Follow up : COPD / O2 dependent RF  Returns for 3 month follow up .  Complains on 1 week of increased  SOB with activity, wheezing, chest congestion/tightness, low grade fever at times, prod cough with a small amount of white mucus. Denies any nausea or vomiting. Admitted last  in Feb with COPD flare. tx w/ abx and steroids . Discharged on taper to '5mg'$  daily but did not take. Does not want to be on daily prednisone. Sputum cx showed Psuedomonas . -pan sensitive.  Has had several admissions over last 4-6 months with COPD, Afib. .  Was on Brovana and Budesonide but insurance will not cover.  Now back on Symbicort and Spiriva.  Remains on Oxygen 2l/m . Uses 3 l/m on pulsing concentrator.  She denies any hemoptysis, orthopnea, PND, or increased leg swelling. Declines flu shot.   Recent dx of NSCLC (Squamous cell) s/t XRT  Last tx 04/15/15 .  Has follow up with Rad Onc in June. With planned CT chest PET scan in 03/13/15 showed Interval improvement in lingular with low-grade metabolic activity. Felt likely due to improving postobstructive pneumonitis. Small solitary focus of hypermetabolic activity in the left hilum. Other metastatic disease noted. CT chest in February showed soft tissue thickening in the left hilum with moderate to marked good luminal narrowing involving the proximal left upper lobe bronchus and moderate narrowing in the proximal lingular bronchus. There is improvement in lingular atelectasis.,  Significant tests/ events PFT's 10/2012 show FEV1/FVC = 71% predicted, FEV1 = 49% predicted, DLCO 39% predicted CT 06/2014 no ILD, resolved nodular ASD CXR 07/2014 int prominence  Review of Systems  neg for any  significant sore throat, dysphagia, itching, sneezing, nasal congestion or excess/ purulent secretions, fever, chills, sweats, unintended wt loss, pleuritic or exertional cp, hempoptysis, orthopnea pnd or change in chronic leg swelling. Also denies presyncope, palpitations, heartburn, abdominal pain, nausea, vomiting, diarrhea or change in bowel or urinary habits, dysuria,hematuria, rash, arthralgias, visual complaints, headache, numbness weakness or ataxia.     Objective:   Physical Exam  Filed Vitals:   05/22/15 1008  BP: 110/74  Pulse: 84  Temp: 98.3 F (36.8 C)  TempSrc: Oral  Height: '5\' 5"'$  (1.651 m)  Weight: 174 lb (78.926 kg)  SpO2: 95%     Gen.well-nourished, in no distress, normal affect, chronically ill appearing on o2 in wc.  ENT - no lesions, no post nasal drip Neck: No JVD, no thyromegaly, no carotid bruits Lungs: no use of accessory muscles, no dullness to percussion, decreased bs in bases, no wheezing , +cough  Cardiovascular: Rhythm regular, heart sounds  normal, no murmurs or gallops, trace peripheral edema Abdomen: soft and non-tender, no hepatosplenomegaly, BS normal. Musculoskeletal: No deformities, no cyanosis or clubbing Neuro:  alert, non focal  Taraya Steward NP-C  [facility]  05/22/2015      Assessment & Plan:

## 2015-05-22 NOTE — Assessment & Plan Note (Signed)
Appears compensated without evidence of volume overload

## 2015-05-22 NOTE — Assessment & Plan Note (Signed)
Compensated on O2  

## 2015-05-22 NOTE — Assessment & Plan Note (Signed)
Recurrent flare with recent sputum cx + for psuedomonas   Plan  Begin Cipro '750mg'$  Twice daily  For 7 days .  Mucinex DM Twice daily  As needed  Cough/congestion .  Continue on Symbicort and Spiriva .  follow up [name]  In 2-3 months and As needed   Please contact office for sooner follow up if symptoms do not improve or worsen or seek emergency care

## 2015-05-22 NOTE — Assessment & Plan Note (Signed)
S/p XRT . (finshed final tx in March 2017 )  Cont w/ follow up with Rad/Onc as planned in June with CT chest

## 2015-05-22 NOTE — Patient Outreach (Signed)
[facility]) Care Management  05/22/2015  Kelsey Garcia 1945/12/08 [id number]   Assessment: Transition of care - week 3 Call placed to speak with patient but unable to reach her. HIPAA compliant voice message left with name and contact number.  Attempted to call again and was able to speak with patient. She reports that she felt exhausted after coming from provider's follow-up appointnment (Pulmonology) this morning.  She verbalized that so far, she is "in good shape". According to patient, she is experiencing colds, congestion and cough with minimal whitish mucus. She was prescribed with antibiotic Cipro 750 mg twice a day for 7 days which will be started when medication will be picked up.  Encouraged patient to take adequate rest, hydration, eat healthy and maintain low salt, heart healthy diet. Patient reports she was also instructed to stop Brovana for now as well as Prednisone and wait after Cipro is completed. She has instructions to follow-up with pulmonologist after 4 weeks as stated.  Patient was also seen by oncologist (Dr. Isidore Moos) on 4/7 and was told she is "doing well", recovering from the effects of radiation. She will be seen for follow-up in 2 months (6/13) for restaging CT of the chest. Cardiologist ([name]) follow-up is on 4/18. Transportation used is through [employer] or [facility] per patient.  Patient reports using Ventolin inhaler (Rescue) 5-6 times this past week with relief. She states that shortness of breath is not more than usual with continued use of home oxygen. She verbalized being in the "green zone". Patient shared that her weight today is 172 pounds and had been recording readings.  Patient admits she has not arranged home health services since "there is too much is going on right now" as stated. She refused help from care management coordinator to arrange for one. Patient denies any immediate needs or concerns at this time. She was encouraged to  call [facility], care management coordinator or 24 hour nurse line if needed. Patient has contact information.   Plan: Transition of care call on 05/31/15  [name] A. Lenoard Helbert, BSN, RN-BC [place] Management Coordinator Cell: [phone]

## 2015-05-22 NOTE — Patient Instructions (Addendum)
Begin Cipro '750mg'$  Twice daily  For 7 days .  Mucinex DM Twice daily  As needed  Cough/congestion .  Continue on Symbicort and Spiriva .  follow up [name]  In 2-3 months and As needed   Please contact office for sooner follow up if symptoms do not improve or worsen or seek emergency care

## 2015-05-25 NOTE — Progress Notes (Signed)
Reviewed & agree with plan  

## 2015-05-28 ENCOUNTER — Encounter: Payer: Self-pay | Admitting: Cardiology

## 2015-05-28 ENCOUNTER — Ambulatory Visit (INDEPENDENT_AMBULATORY_CARE_PROVIDER_SITE_OTHER): Payer: Medicare Other | Admitting: Cardiology

## 2015-05-28 VITALS — BP 102/58 | HR 76 | Ht 65.0 in | Wt 164.8 lb

## 2015-05-28 DIAGNOSIS — I10 Essential (primary) hypertension: Secondary | ICD-10-CM

## 2015-05-28 DIAGNOSIS — I4891 Unspecified atrial fibrillation: Secondary | ICD-10-CM | POA: Diagnosis not present

## 2015-05-28 NOTE — Patient Instructions (Addendum)
Medication Instructions:  Your physician recommends that you continue on your current medications as directed. Please refer to the Current Medication list given to you today.   Labwork: None ordered  Testing/Procedures: None ordered  Follow-Up: Your physician wants you to follow-up in: [place] [name]  You will receive a reminder letter in the mail two months in advance. If you don't receive a letter, please call our office to schedule the follow-up appointment.    Any Other Special Instructions Will Be Listed Below (If Applicable).     If you need a refill on your cardiac medications before your next appointment, please call your pharmacy.
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
## 2015-05-31 ENCOUNTER — Other Ambulatory Visit: Payer: Self-pay | Admitting: *Deleted

## 2015-05-31 NOTE — Patient Outreach (Addendum)
[facility]) Care Management  05/31/2015  Kelsey Garcia 29-Jan-1946 [id number]   Assessment: Transition of care - week 4 Call placed and spoke with patient very briefly.  Patient verbalized that she is "just waking up and has not had coffee yet".  Unable to ask patient the best time to call her back since she was in a hurry to hang up the phone.   Plan: Will attempt to contact patient again later today.   Addendum: Call placed to speak with patient but was unable to reach her. HIPAA compliant voice message left with name and contact information.  Plan: Will await for patient's return call.  If unable to receive a call back, will reschedule her for next outreach call.    Attempted to call patient back later in the day but no answer. HIPAA compliant voice message left with name and contact number.    Inbound call received from patient.  Spoke with patient and verbalized that she is "having some dizzy spells" that she is attributing to antibiotics Cipro.  Patient states that she had completed Cipro dose with not much help.  Patient seemed anxious during this conversation and was reminded to take her medication for anxiety. Patient was also reminded be safe and avoid falls. Encouraged patient to call doctor's office to notify and get help for her dizziness. Patient agreed and states she will call once we hang up the phone. Patient also verbalized "I'll be ok". She states that if condition gets worse, "I will go to the ER". Reinforced with patient to call the doctor's office today to seek for medical advise and not to wait to get worse, and she agreed.   Patient denies increased cough, colds and congestion. Patient reports no shortness of breath more than usual. She verbalized being in the "green zone" at this time and states "nothing seriously wrong going on right now". She reports that rescue inhaler had been used about 3-4 times this week.  She mentioned that her weight  is 164 pounds today.   Patient requested to call back another day to set-up a routine home visit and not at the moment since patient is anxious.   Plan: Will attempt to call back next week to set-up a schedule for routine home visit with patient.  Sarahy Creedon A. Hriday Stai, BSN, RN-BC [place] Management Coordinator Cell: [phone]

## 2015-06-01 ENCOUNTER — Encounter (HOSPITAL_COMMUNITY): Payer: Self-pay | Admitting: Emergency Medicine

## 2015-06-01 ENCOUNTER — Emergency Department (HOSPITAL_COMMUNITY): Payer: Medicare Other

## 2015-06-01 ENCOUNTER — Emergency Department (HOSPITAL_COMMUNITY)
Admission: EM | Admit: 2015-06-01 | Discharge: 2015-06-01 | Disposition: A | Discharge status: Home / Self Care | Payer: Medicare Other | Attending: Emergency Medicine | Admitting: Emergency Medicine

## 2015-06-01 ENCOUNTER — Telehealth: Payer: Self-pay | Admitting: Internal Medicine

## 2015-06-01 DIAGNOSIS — R05 Cough: Secondary | ICD-10-CM | POA: Insufficient documentation

## 2015-06-01 DIAGNOSIS — Z792 Long term (current) use of antibiotics: Secondary | ICD-10-CM | POA: Diagnosis not present

## 2015-06-01 DIAGNOSIS — G4700 Insomnia, unspecified: Secondary | ICD-10-CM | POA: Insufficient documentation

## 2015-06-01 DIAGNOSIS — R42 Dizziness and giddiness: Secondary | ICD-10-CM | POA: Diagnosis present

## 2015-06-01 DIAGNOSIS — Z79899 Other long term (current) drug therapy: Secondary | ICD-10-CM | POA: Insufficient documentation

## 2015-06-01 DIAGNOSIS — C3412 Malignant neoplasm of upper lobe, left bronchus or lung: Secondary | ICD-10-CM

## 2015-06-01 DIAGNOSIS — R059 Cough, unspecified: Secondary | ICD-10-CM

## 2015-06-01 DIAGNOSIS — I110 Hypertensive heart disease with heart failure: Secondary | ICD-10-CM | POA: Insufficient documentation

## 2015-06-01 DIAGNOSIS — F329 Major depressive disorder, single episode, unspecified: Secondary | ICD-10-CM | POA: Diagnosis not present

## 2015-06-01 DIAGNOSIS — D649 Anemia, unspecified: Secondary | ICD-10-CM | POA: Insufficient documentation

## 2015-06-01 DIAGNOSIS — E876 Hypokalemia: Secondary | ICD-10-CM | POA: Insufficient documentation

## 2015-06-01 DIAGNOSIS — I429 Cardiomyopathy, unspecified: Secondary | ICD-10-CM | POA: Diagnosis not present

## 2015-06-01 DIAGNOSIS — M1990 Unspecified osteoarthritis, unspecified site: Secondary | ICD-10-CM | POA: Insufficient documentation

## 2015-06-01 DIAGNOSIS — J449 Chronic obstructive pulmonary disease, unspecified: Secondary | ICD-10-CM | POA: Insufficient documentation

## 2015-06-01 DIAGNOSIS — Z87891 Personal history of nicotine dependence: Secondary | ICD-10-CM | POA: Diagnosis not present

## 2015-06-01 DIAGNOSIS — Z7951 Long term (current) use of inhaled steroids: Secondary | ICD-10-CM | POA: Diagnosis not present

## 2015-06-01 DIAGNOSIS — R531 Weakness: Secondary | ICD-10-CM | POA: Diagnosis not present

## 2015-06-01 DIAGNOSIS — Z85118 Personal history of other malignant neoplasm of bronchus and lung: Secondary | ICD-10-CM | POA: Insufficient documentation

## 2015-06-01 DIAGNOSIS — Z7982 Long term (current) use of aspirin: Secondary | ICD-10-CM | POA: Insufficient documentation

## 2015-06-01 DIAGNOSIS — Z79891 Long term (current) use of opiate analgesic: Secondary | ICD-10-CM | POA: Diagnosis not present

## 2015-06-01 DIAGNOSIS — E785 Hyperlipidemia, unspecified: Secondary | ICD-10-CM | POA: Diagnosis not present

## 2015-06-01 DIAGNOSIS — K219 Gastro-esophageal reflux disease without esophagitis: Secondary | ICD-10-CM | POA: Diagnosis not present

## 2015-06-01 DIAGNOSIS — Z7952 Long term (current) use of systemic steroids: Secondary | ICD-10-CM | POA: Diagnosis not present

## 2015-06-01 DIAGNOSIS — I509 Heart failure, unspecified: Secondary | ICD-10-CM | POA: Insufficient documentation

## 2015-06-01 DIAGNOSIS — I4891 Unspecified atrial fibrillation: Secondary | ICD-10-CM | POA: Diagnosis not present

## 2015-06-01 DIAGNOSIS — J9811 Atelectasis: Secondary | ICD-10-CM

## 2015-06-01 LAB — CBC WITH DIFFERENTIAL/PLATELET
Basophils Absolute: 0 10*3/uL (ref 0.0–0.1)
Basophils Relative: 0 %
Eosinophils Absolute: 0.2 10*3/uL (ref 0.0–0.7)
Eosinophils Relative: 2 %
HEMATOCRIT: 34.8 % — AB (ref 36.0–46.0)
HEMOGLOBIN: 10.6 g/dL — AB (ref 12.0–15.0)
LYMPHS ABS: 1 10*3/uL (ref 0.7–4.0)
Lymphocytes Relative: 15 %
MCH: 26.8 pg (ref 26.0–34.0)
MCHC: 30.5 g/dL (ref 30.0–36.0)
MCV: 88.1 fL (ref 78.0–100.0)
MONOS PCT: 14 %
Monocytes Absolute: 0.9 10*3/uL (ref 0.1–1.0)
NEUTROS ABS: 4.7 10*3/uL (ref 1.7–7.7)
NEUTROS PCT: 69 %
Platelets: 288 10*3/uL (ref 150–400)
RBC: 3.95 MIL/uL (ref 3.87–5.11)
RDW: 15.1 % (ref 11.5–15.5)
WBC: 6.7 10*3/uL (ref 4.0–10.5)

## 2015-06-01 LAB — I-STAT CHEM 8, ED
BUN: 12 mg/dL (ref 6–20)
CALCIUM ION: 1.11 mmol/L — AB (ref 1.13–1.30)
Chloride: 99 mmol/L — ABNORMAL LOW (ref 101–111)
Creatinine, Ser: 0.7 mg/dL (ref 0.44–1.00)
Glucose, Bld: 106 mg/dL — ABNORMAL HIGH (ref 65–99)
HEMATOCRIT: 36 % (ref 36.0–46.0)
Hemoglobin: 12.2 g/dL (ref 12.0–15.0)
Potassium: 3.4 mmol/L — ABNORMAL LOW (ref 3.5–5.1)
Sodium: 141 mmol/L (ref 135–145)
TCO2: 29 mmol/L (ref 0–100)

## 2015-06-01 MED ORDER — HYDROCODONE-ACETAMINOPHEN 5-325 MG PO TABS: 1.0000 | ORAL_TABLET | ORAL | Status: DC | PRN

## 2015-06-01 MED ORDER — HYDROCODONE-ACETAMINOPHEN 5-325 MG PO TABS
1.0000 | ORAL_TABLET | Freq: Once | ORAL | Status: AC
  Administered 2015-06-01: 1 via ORAL
  Filled 2015-06-01: qty 1

## 2015-06-01 MED ORDER — IOPAMIDOL (ISOVUE-300) INJECTION 61%
80.0000 mL | Freq: Once | INTRAVENOUS | Status: AC | PRN
  Administered 2015-06-01: 80 mL via INTRAVENOUS

## 2015-06-01 MED ORDER — POTASSIUM CHLORIDE CRYS ER 20 MEQ PO TBCR
40.0000 meq | EXTENDED_RELEASE_TABLET | Freq: Once | ORAL | Status: AC
  Administered 2015-06-01: 40 meq via ORAL
  Filled 2015-06-01: qty 2

## 2015-06-01 MED ORDER — DOXYCYCLINE HYCLATE 100 MG PO TABS
100.0000 mg | ORAL_TABLET | Freq: Once | ORAL | Status: AC
  Administered 2015-06-01: 100 mg via ORAL
  Filled 2015-06-01: qty 1

## 2015-06-01 MED ORDER — DOXYCYCLINE HYCLATE 100 MG PO CAPS: 100.0000 mg | ORAL_CAPSULE | Freq: Two times a day (BID) | ORAL | Status: DC

## 2015-06-01 MED ORDER — SODIUM CHLORIDE 0.9 % IV BOLUS (SEPSIS)
500.0000 mL | Freq: Once | INTRAVENOUS | Status: AC
  Administered 2015-06-01: 500 mL via INTRAVENOUS

## 2015-06-01 NOTE — Telephone Encounter (Signed)
Triage  This [name] patient went to ER has new LLL collapse - [place] patient. Doing well per eMD Dr Oletta Darter. ER plans to send patienthome from [place] er. Marland Kitchen He wanted me to sent this note so patient can be worked in ASAP for this collapse. Sending note to triage and Dr [name]  Dr. Brand Males, M.D., [facility].C.P Pulmonary and Critical Care Medicine Staff Physician [facility] Pager: [phone], If no answer or between  15:00h - 7:00h: call [phone]  06/01/2015 6:20 PM

## 2015-06-01 NOTE — Discharge Instructions (Signed)
Call [name] for an appointment on Monday. Return to ER with cough, bloody cough, fever, other/any worsening.   Cough, Adult Coughing is a reflex that clears your throat and your airways. Coughing helps to heal and protect your lungs. It is normal to cough occasionally, but a cough that happens with other symptoms or lasts a long time may be a sign of a condition that needs treatment. A cough may last only 2-3 weeks (acute), or it may last longer than 8 weeks (chronic). CAUSES Coughing is commonly caused by:  Breathing in substances that irritate your lungs.  A viral or bacterial respiratory infection.  Allergies.  Asthma.  Postnasal drip.  Smoking.  Acid backing up from the stomach into the esophagus (gastroesophageal reflux).  Certain medicines.  Chronic lung problems, including COPD (or rarely, lung cancer).  Other medical conditions such as heart failure. HOME CARE INSTRUCTIONS  Pay attention to any changes in your symptoms. Take these actions to help with your discomfort:  Take medicines only as told by your health care provider.  If you were prescribed an antibiotic medicine, take it as told by your health care provider. Do not stop taking the antibiotic even if you start to feel better.  Talk with your health care provider before you take a cough suppressant medicine.  Drink enough fluid to keep your urine clear or pale yellow.  If the air is dry, use a cold steam vaporizer or humidifier in your bedroom or your home to help loosen secretions.  Avoid anything that causes you to cough at work or at home.  If your cough is worse at night, try sleeping in a semi-upright position.  Avoid cigarette smoke. If you smoke, quit smoking. If you need help quitting, ask your health care provider.  Avoid caffeine.  Avoid alcohol.  Rest as needed. SEEK MEDICAL CARE IF:   You have new symptoms.  You cough up pus.  Your cough does not get better after 2-3 weeks, or  your cough gets worse.  You cannot control your cough with suppressant medicines and you are losing sleep.  You develop pain that is getting worse or pain that is not controlled with pain medicines.  You have a fever.  You have unexplained weight loss.  You have night sweats. SEEK IMMEDIATE MEDICAL CARE IF:  You cough up blood.  You have difficulty breathing.  Your heartbeat is very fast.   This information is not intended to replace advice given to you by your health care provider. Make sure you discuss any questions you have with your health care provider.   Document Released: 07/25/2010 Document Revised: 10/17/2014 Document Reviewed: 04/04/2014 Elsevier Interactive Patient Education 2016 [employer].  Chemotherapy Chemotherapy is the use of medicines to stop or slow the growth of cancer cells. Depending on the type and stage of your cancer, you may have chemotherapy to:  Cure your cancer.  Slow the progression of your cancer.  Ease your cancer symptoms.  Improve the benefits of radiation treatment.  Shrink a tumor before surgery.  Rid the body of cancer cells that remain after a tumor is surgically removed. HOW IS CHEMOTHERAPY GIVEN? Chemotherapy may be given:  By mouth in liquid or pill form.  Through a thin tube that is inserted into a vein or artery.  By getting a shot.  By rubbing a cream or ointment on your skin.  Through liquids that are placed directly into various areas of the body, such as the abdomen,  chest, or bladder. HOW OFTEN IS CHEMOTHERAPY GIVEN? Chemotherapy may be given continuously over time, or it may be given in cycles. For example, you may take the medicine for one week out of every month. FOR HOW LONG WILL I NEED CHEMOTHERAPY TREATMENTS? The length of treatment depends on many factors, including:  The type of cancer.  Whether the cancer has spread.  How you respond to the chemotherapy.  Whether you develop side effects. Some  types of chemotherapy medicine are given only one time. Others are given for months, years, or for life. WHAT SAFETY PRECAUTIONS MUST I TAKE WHILE ON CHEMOTHERAPY? Chemotherapy medicines are very strong. They will be in all of your bodily fluids, including your urine, stool, saliva, sweat, tears, vaginal secretions, and semen. You must carefully follow some safety precautions to prevent harm to others while you are using these medicines. Here are some recommended precautions:  Make sure that people who help care for you wear disposable gloves if they are going to come into contact with any of your bodily fluids. Women who are pregnant or breastfeeding should not handle any of your bodily fluids.  Wash any clothes, towels, and linens that may have your bodily fluids on them twice in a washing machine using very hot water.  Dispose of adult diapers, tampons, and sanitary napkins by first sealing them in a plastic bag.  Use a condom when having sex for at least 2 weeks after receiving your chemotherapy.  Do not share beverages or food.  Keep your chemotherapy medicines in their original bottles. Keep them in a high, safe location, away from children. Do not expose them to heat or moisture. Do not put them in containers with other types of medicines.  Dispose of all wrappers for your chemotherapy medicines by sealing them in a separate plastic bag.  Do not throw away extra medicine, and do not flush it down the toilet. Take medicine that you are not going to use to your health care provider's office where it can be disposed of properly.  Follow your health care provider's directions for the proper disposal of needles, IV tubing, and other medical supplies that have come into contact with your chemotherapy medicines.  If you are issued a hazardous waste container, make sure you understand the directions for using it.  Wash your hands thoroughly with warm water and soap after using the bathroom.  Dry your hands with disposable paper towels.  When using the toilet:  Flush it twice after each use, including after vomiting.  Close the lid of the toilet prior to flushing. This helps to avoid splashing.  Both men and women should sit to use the toilet. This helps avoid splashing. WHAT ARE THE SIDE EFFECTS OF CHEMOTHERAPY? Side effects depend on a variety of factors, including:  The specific type of chemotherapy medicine used.  The dosage.  How long the medicine is used for.  Your overall health. Some of the side effects you may experience include:  Fatigue and decreased energy.  Decreased appetite.  Changes in your sense of smell or taste.  Nausea.  Vomiting.  Constipation or diarrhea.  Hair loss.  Increased susceptibility to infection.  Easy bleeding.  Mouth sores.  Burning or tingling in the hands or feet.  Memory problems.   This information is not intended to replace advice given to you by your health care provider. Make sure you discuss any questions you have with your health care provider.   Document Released: 11/23/2006 Document  Revised: 02/16/2014 Document Reviewed: 07/04/2013 Elsevier Interactive Patient Education [employer].

## 2015-06-01 NOTE — ED Notes (Addendum)
Pt reports cough for the past week. Also having dizziness since sunday. Saw cardiologist on Tuesday who adjusted medications, but this is not helping. Pt has hx of lung cancer. Finished her radiation treatments a few weeks ago; did not have chemo. On 2L  home O2.

## 2015-06-01 NOTE — ED Notes (Signed)
She eats a supper tray without difficulty.  She is pleased with her care.

## 2015-06-01 NOTE — ED Provider Notes (Signed)
Since seen and evaluated.  Discussed with Dr. Cathleen Fears. Patient has atelectasis of her left upper lobe on x-ray. Has history of known left upper lobe bronchus cancer. Status post radiation therapy, most recent course ending in March. Follows with Dr. Isidore Moos. Scheduled for follow-up CT and reevaluation in May. No current treatment. Here for cough. CT scan recommended. This shows worsening of mass effect on left upper lobe bronchus with peripheral/distal atelectasis. Some areas of nonspecific infiltrate in the right upper lobe. Not tachycardic or hypoxemic. No chest pain. Clinically and radiographically no PE. I discussed the case with Dr. Janann Colonel of pulmonary.  He felt patient could be discharged with follow-up on Monday.  I agree. He discussed the case with his partners, he is sent a message electronically the office and hopefully the patient will be able to the follow-up expeditiously Monday or Tuesday. Return precautions including fever, hemoptysis, chest pain, short of breath or any worsening symptoms. She has a strong desire not been medical Hospital. I feel she is appropriate for outpatient treatment. She is saturating 95% on her typical 2 L home O2.  Tanna Furry, MD 06/01/15 [phone]

## 2015-06-01 NOTE — ED Provider Notes (Signed)
CSN: [id number]     Arrival date & time 06/01/15  1245 History   First MD Initiated Contact with Patient 06/01/15 1321     Chief Complaint  Patient presents with  . Cough  . Dizziness     (Consider location/radiation/quality/duration/timing/severity/associated sxs/prior Treatment) HPI Complains of cough which is chronic lasting several months. For the past 2 days she states "I'm coughing more and I feel like it's in my throat" she also complains of lightheadedness with standing for approximately a week. She denies any chest pain denies fever. She saw [facility] in the office 4 days ago. Was recommended to continue Lasix, albuterol was switched to Xopenex. She denies any shortness of breath. Denies other associated symptoms. Past Medical History  Diagnosis Date  . Atrial fibrillation ([place])   . Hypertension   . Insomnia   . Nonischemic cardiomyopathy ([place])   . Hyperlipidemia   . Anxiety   . Cholelithiasis   . Insomnia   . Mitral regurgitation     noted 2010  . H/O epistaxis   . Rhinitis, allergic   . CHF (congestive heart failure) ([place])   . Pneumonia     "several times w/exacerbations of the COPD; nothing in the last year" (07/15/2012)  . COPD (chronic obstructive pulmonary disease) ([place])     as of 7/13 on 2-3L, pfts 10/2008 with mod obstruction  . Chronic bronchitis with COPD (chronic obstructive pulmonary disease) ([place])   . Shortness of breath     "all the time right now" (07/15/2012)  . GERD (gastroesophageal reflux disease)   . HERDEYCX(448.1)     "weekly" (07/15/2012)  . Migraines     "weekly for awhile; cleared up as I got older" (07/15/2012)  . DJD (degenerative joint disease)   . Arthritis     "all over" (07/15/2012)  . OCD (obsessive compulsive disorder)   . OCD (obsessive compulsive disorder)   . Depression     h/o SI; "last time I was really serious about it was ~ 1997" (07/15/2012)  . Thoracic vertebral fracture ([place]) 11/03/2014  . Primary lung cancer [facility])     Past Surgical History  Procedure Laterality Date  . Tubal ligation  1972  . Cardioversion  2003; 07/2003  . Video bronchoscopy Bilateral 02/27/2015    Procedure: VIDEO BRONCHOSCOPY WITH FLUORO;  Surgeon: Chesley Mires, MD;  Location: [facility];  Service: Cardiopulmonary;  Laterality: Bilateral;   Family History  Problem Relation Age of Onset  . Asthma    . Emphysema    . Allergies    . Cancer      aunt had several types of cancer  . COPD Mother   . Emphysema Mother   . Cirrhosis Father    Social History  Substance Use Topics  . Smoking status: Former Smoker -- 1.00 packs/day for 35 years    Types: Cigarettes    Quit date: 06/18/2014  . Smokeless tobacco: Never Used  . Alcohol Use: No   OB History    No data available     Review of Systems  Respiratory: Positive for cough. Negative for shortness of breath.   Musculoskeletal: Positive for myalgias and neck pain.       Neck pain and left arm pain for 1 year, chronically on Norco for neck pain and arm pain  Allergic/Immunologic: Positive for immunocompromised state.       Lung cancer  Neurological: Positive for light-headedness.  All other systems reviewed and are negative.  Allergies  Ace inhibitors; Codeine; Pseudoeph-doxylamine-dm-apap; Diphenhydramine hcl; Erythromycin; Nsaids; Nyquil; Tramadol hcl; Fosamax; Tomato; Orange fruit; and Pseudoephedrine  Home Medications   Prior to Admission medications   Medication Sig Start Date End Date Taking? Authorizing Provider  aspirin 325 MG EC tablet Take 650 mg by mouth daily with breakfast.     Historical Provider, MD  budesonide-formoterol (SYMBICORT) 160-4.5 MCG/ACT inhaler Inhale 2 puffs into the lungs 2 (two) times daily.    Historical Provider, MD  busPIRone (BUSPAR) 10 MG tablet Take 10 mg by mouth 3 (three) times daily. 04/22/14   Historical Provider, MD  ciprofloxacin (CIPRO) 750 MG tablet Take 1 tablet (750 mg total) by mouth 2 (two) times daily. Patient not  taking: Reported on 05/31/2015 05/22/15   Melvenia Needles, NP  diazepam (VALIUM) 2 MG tablet 1 by mouth every 6 hours as needed 05/02/15   Gerlene Fee, NP  diltiazem (CARDIZEM) 120 MG tablet Take 1 tablet (120 mg total) by mouth every 8 (eight) hours. 03/12/15   Rigoberto Noel, MD  FLUoxetine (PROZAC) 20 MG capsule Take 3 capsules by mouth once daily    Historical Provider, MD  furosemide (LASIX) 20 MG tablet Take 40 mg by mouth daily.     Historical Provider, MD  guaiFENesin (MUCINEX) 600 MG 12 hr tablet Take 600 mg by mouth 2 (two) times daily as needed for cough.    Historical Provider, MD  HYDROcodone-acetaminophen (NORCO) 10-325 MG tablet Take 1 tablet by mouth every 4 (four) hours as needed for moderate pain (Pain or Dyspnea). 05/02/15   Gerlene Fee, NP  HYDROcodone-homatropine (HYCODAN) 5-1.5 MG/5ML syrup Take 2.5 mLs by mouth 2 (two) times daily as needed for cough. 05/02/15   Gerlene Fee, NP  ipratropium-albuterol (DUONEB) 0.5-2.5 (3) MG/3ML SOLN Take 3 mLs by nebulization 3 (three) times daily. 03/07/15   Tammy S Parrett, NP  metoprolol succinate (TOPROL-XL) 25 MG 24 hr tablet Take 1 tablet (25 mg total) by mouth daily. 02/14/15   Brittainy Erie Noe, PA-C  OXYGEN Inhale into the lungs. 2-3L/min    Historical Provider, MD  pantoprazole (PROTONIX) 40 MG tablet TAKE ONE TABLET BY MOUTH TWICE DAILY 01/03/14   Dellia Nims, MD  potassium chloride SA (KLOR-CON M20) 20 MEQ tablet Take 20 mEq by mouth once.    Historical Provider, MD  SPIRIVA HANDIHALER 18 MCG inhalation capsule PLACE ONE CAPSULE INTO INHALER AND INHALE DAILY 03/07/15   Rigoberto Noel, MD  VENTOLIN HFA 108 (90 Base) MCG/ACT inhaler INHALE ONE TO TWO PUFFS BY MOUTH EVERY 6 HOURS AS NEEDED FOR WHEEZING 03/07/15   Rigoberto Noel, MD   BP 100/89 mmHg  Pulse 92  Temp(Src) 98.4 F (36.9 C) (Oral)  Resp 22  SpO2 93% Physical Exam  Constitutional: She is oriented to person, place, and time. She appears well-developed and  well-nourished.  HENT:  Head: Normocephalic and atraumatic.  Eyes: Conjunctivae are normal. Pupils are equal, round, and reactive to light.  Neck: Neck supple. No tracheal deviation present. No thyromegaly present.  Cardiovascular: Normal rate and regular rhythm.   No murmur heard. Pulmonary/Chest: Effort normal and breath sounds normal.  Abdominal: Soft. Bowel sounds are normal. She exhibits no distension. There is no tenderness.  Musculoskeletal: Normal range of motion. She exhibits no edema or tenderness.  Neurological: She is alert and oriented to person, place, and time. No cranial nerve deficit. Coordination normal.  Gait normal. She is not lightheaded on standing or on walking  around the room.  Skin: Skin is warm and dry. No rash noted.  Psychiatric: She has a normal mood and affect.  Nursing note and vitals reviewed.   ED Course  Procedures (including critical care time) Labs Review Labs Reviewed - No data to display  Imaging Review No results found. I have personally reviewed and evaluated these images and lab results as part of my medical decision-making.   EKG Interpretation None     Chest x-ray viewed by me Results for orders placed or performed during the hospital encounter of 06/01/15  CBC with Differential/Platelet  Result Value Ref Range   WBC 6.7 4.0 - 10.5 K/uL   RBC 3.95 3.87 - 5.11 MIL/uL   Hemoglobin 10.6 (L) 12.0 - 15.0 g/dL   HCT 34.8 (L) 36.0 - 46.0 %   MCV 88.1 78.0 - 100.0 fL   MCH 26.8 26.0 - 34.0 pg   MCHC 30.5 30.0 - 36.0 g/dL   RDW 15.1 11.5 - 15.5 %   Platelets 288 150 - 400 K/uL   Neutrophils Relative % 69 %   Neutro Abs 4.7 1.7 - 7.7 K/uL   Lymphocytes Relative 15 %   Lymphs Abs 1.0 0.7 - 4.0 K/uL   Monocytes Relative 14 %   Monocytes Absolute 0.9 0.1 - 1.0 K/uL   Eosinophils Relative 2 %   Eosinophils Absolute 0.2 0.0 - 0.7 K/uL   Basophils Relative 0 %   Basophils Absolute 0.0 0.0 - 0.1 K/uL  I-stat chem 8, ed  Result Value Ref  Range   Sodium 141 135 - 145 mmol/L   Potassium 3.4 (L) 3.5 - 5.1 mmol/L   Chloride 99 (L) 101 - 111 mmol/L   BUN 12 6 - 20 mg/dL   Creatinine, Ser 0.70 0.44 - 1.00 mg/dL   Glucose, Bld 106 (H) 65 - 99 mg/dL   Calcium, Ion 1.11 (L) 1.13 - 1.30 mmol/L   TCO2 29 0 - 100 mmol/L   Hemoglobin 12.2 12.0 - 15.0 g/dL   HCT 36.0 36.0 - 46.0 %   Dg Chest 2 View  06/01/2015  CLINICAL DATA:  70 year old female with congestion and cough. History of prior lung cancer. EXAM: CHEST  2 VIEW COMPARISON:  Prior chest x-ray 04/04/2015 FINDINGS: Hazy opacity throughout the left hemi thorax confined to the anterior aspect of the chest concerning for left upper lobar atelectasis. There appears to be some compensatory hypertrophy of the left lower lobe. Otherwise, stable cardiomegaly. Tortuous and atherosclerotic thoracic aorta. Chronic bronchitic change and interstitial prominence again noted. No acute osseous abnormality. IMPRESSION: 1. Chest x-ray findings are concerning for interval development of left upper lobe collapse. Obstruction of the left upper lobe bronchi versus recurrent neoplastic disease with central obstructing mass. Consider further evaluation with bronchoscopy, or CT scan of the chest with contrast. 2. Stable cardiomegaly. Electronically Signed   By: Jacqulynn Cadet M.D.   On: 06/01/2015 14:34    Norco ordered for chronic neck and shoulder pain . Pt signed out to Dr .Jeneen Rinks 410 pm. MDM   Anemia is chronic Dx #1 cough #2 hypokalemia #3anemia #4 weakness Final diagnoses:  None         Orlie Dakin, MD 06/01/15 [phone]

## 2015-06-03 NOTE — Telephone Encounter (Signed)
Spoke with pt, scheduled Thursday with SG.  Nothing further needed.

## 2015-06-03 NOTE — Telephone Encounter (Signed)
Pl set her up with me -tmoro  2 openings 10 45 or 130 pm Also prefer 4 15 pt to coe earlier -last pt at 4p

## 2015-06-04 ENCOUNTER — Other Ambulatory Visit: Payer: Self-pay | Admitting: *Deleted

## 2015-06-04 NOTE — Patient Outreach (Addendum)
[facility]) Care Management  06/04/2015  Kelsey Garcia 1945-11-02 [id number]   Assessment : Care coordination Call placed and spoke with patient briefly since she reports "ears are stopped up" and not a good time to talk.  Care management coordinator asked patient when would be a good time to call her back. Patient stated "it will be a while". She agreed to be seen for a home visit on 5/5. Conversation ended early when patient seemed to be irritable and starts to raise voice when asked how she is doing (to follow-up with dizzy spells that she reported last week).  Plan: Will do routine home visit on 06/14/15.  Alethia Melendrez A. Almalik Weissberg, BSN, RN-BC [place] Management Coordinator Cell: [phone]

## 2015-06-05 NOTE — Telephone Encounter (Signed)
Attempted to contact patient yesterday, but when I called, patient stated she was doing breathing treatment and stated she would call back after she has completed her breathing treatment. Did not hear back from patient that day.  Attempted to contact patient later that afternoon and left message for her to call back.   Did not see message until 4/25 due to message being closed.  Received message from [name] via Staff message requesting that I contact patient Patient has already been scheduled to see Sarah.   Called patient to see if she would like to see [name] in [facility] office tomorrow. Rescheduled patient to see [name] in [facility] tomorrow at 1:45pm.  Cancelled appointment with Judson Roch. Patient aware of change in appointment and preferred to see [name] in [place]. Nothing further needed.

## 2015-06-06 ENCOUNTER — Encounter: Payer: Self-pay | Admitting: Pulmonary Disease

## 2015-06-06 ENCOUNTER — Inpatient Hospital Stay: Payer: Medicare Other | Admitting: Acute Care

## 2015-06-06 ENCOUNTER — Ambulatory Visit (INDEPENDENT_AMBULATORY_CARE_PROVIDER_SITE_OTHER): Payer: Medicare Other | Admitting: Pulmonary Disease

## 2015-06-06 VITALS — BP 94/63 | HR 81 | Ht 65.0 in | Wt 166.0 lb

## 2015-06-06 DIAGNOSIS — J449 Chronic obstructive pulmonary disease, unspecified: Secondary | ICD-10-CM | POA: Diagnosis not present

## 2015-06-06 DIAGNOSIS — J9611 Chronic respiratory failure with hypoxia: Secondary | ICD-10-CM | POA: Diagnosis not present

## 2015-06-06 DIAGNOSIS — C3492 Malignant neoplasm of unspecified part of left bronchus or lung: Secondary | ICD-10-CM | POA: Diagnosis not present

## 2015-06-06 MED ORDER — LEVOFLOXACIN 500 MG PO TABS: 500.0000 mg | ORAL_TABLET | Freq: Every day | ORAL | Status: DC

## 2015-06-06 NOTE — Progress Notes (Signed)
Subjective:    Patient ID: Kelsey Garcia, female    DOB: Nov 17, 1945, 70 y.o.   MRN: [id number]  HPI  13/ F, smoker presents for FU of COPD & lung cancer  Recent dx of NSCLC (Squamous cell) s/t XRT  Last tx 04/15/15 .  She has a h/o opiate abuse in the 19's.      06/06/2015  Chief Complaint  Patient presents with  . Hospitalization Follow-up    discuss results from hospital.  Pt sob, coughing up white/gray mucus, chest tightness.      ER visit on 4/22- 'my allergies for the better of me' Chest x-ray showed left upper lobe collapse CT chest confirmed and also showed nodular changes in the right upper lobe  Admitted last  in Feb with COPD flare. tx w/ abx and steroids . Discharged on taper to '5mg'$  daily but did not take. Does not want to be on daily prednisone. Sputum cx showed Psuedomonas . -pan sensitive.  Has had several admissions over last 4-6 months with COPD, Afib. .  Was on Brovana and Budesonide but insurance will not cover.  Now back on Symbicort and Spiriva.  Remains on Oxygen 2l/m . Uses 3 l/m on pulsing concentrator Put her oxygen level low is low on pulse today required 3 L continuous to bring her up to 91%   PET scan in 03/13/15 showed Interval improvement in lingular with low-grade metabolic activity. Felt likely due to improving postobstructive pneumonitis. Small solitary focus of hypermetabolic activity in the left hilum.   CT chest in February showed soft tissue thickening in the left hilum with moderate to marked good luminal narrowing involving the proximal left upper lobe bronchus and moderate narrowing in the proximal lingular bronchus. There is improvement in lingular atelectasis.,  Significant tests/ events PFT's 10/2012 show FEV1/FVC = 71% predicted, FEV1 = 49% predicted, DLCO 39% predicted CT 06/2014 no ILD, resolved nodular ASD CXR 07/2014 int prominence   Past Medical History  Diagnosis Date  . Atrial fibrillation ([place])   . Hypertension   . Insomnia     . Nonischemic cardiomyopathy ([place])   . Hyperlipidemia   . Anxiety   . Cholelithiasis   . Insomnia   . Mitral regurgitation     noted 2010  . H/O epistaxis   . Rhinitis, allergic   . CHF (congestive heart failure) ([place])   . Pneumonia     "several times w/exacerbations of the COPD; nothing in the last year" (07/15/2012)  . COPD (chronic obstructive pulmonary disease) ([place])     as of 7/13 on 2-3L, pfts 10/2008 with mod obstruction  . Chronic bronchitis with COPD (chronic obstructive pulmonary disease) ([place])   . Shortness of breath     "all the time right now" (07/15/2012)  . GERD (gastroesophageal reflux disease)   . FWYOVZCH(885.0)     "weekly" (07/15/2012)  . Migraines     "weekly for awhile; cleared up as I got older" (07/15/2012)  . DJD (degenerative joint disease)   . Arthritis     "all over" (07/15/2012)  . OCD (obsessive compulsive disorder)   . OCD (obsessive compulsive disorder)   . Depression     h/o SI; "last time I was really serious about it was ~ 1997" (07/15/2012)  . Thoracic vertebral fracture ([place]) 11/03/2014  . Primary lung cancer ([place])      Review of Systems  neg for any significant sore throat, dysphagia, itching, sneezing, nasal congestion or excess/ purulent secretions, fever,  chills, sweats, unintended wt loss, pleuritic or exertional cp, hempoptysis, orthopnea pnd or change in chronic leg swelling. Also denies presyncope, palpitations, heartburn, abdominal pain, nausea, vomiting, diarrhea or change in bowel or urinary habits, dysuria,hematuria, rash, arthralgias, visual complaints, headache, numbness weakness or ataxia.     Objective:   Physical Exam  Gen. Pleasant, well-nourished, in no distress,In wheelchair ENT - no lesions, no post nasal drip Neck: No JVD, no thyromegaly, no carotid bruits Lungs: no use of accessory muscles, no dullness to percussion, decreased left apex without rales or rhonchi  Cardiovascular: Rhythm regular, heart sounds  normal, no  murmurs or gallops, no peripheral edema Musculoskeletal: No deformities, no cyanosis or clubbing        Assessment & Plan:

## 2015-06-06 NOTE — Assessment & Plan Note (Signed)
Stay on Spiriva and DuoNeb's every 6 hours Use flutter valve every 3-4 hours

## 2015-06-06 NOTE — Assessment & Plan Note (Addendum)
You have part of left upper lung collapse Referral to oncology-Dr. Julien Nordmann  Unfortunately not a candidate for surgery If no further treatment planned, we discussed hospice care as a source of support We briefly discussed advanced directives-she will discuss more with her family. I advised against mechanical ventilation  Levaquin 500 daily 7

## 2015-06-06 NOTE — Assessment & Plan Note (Signed)
You need 3 L continuous oxygen Pulse  not enough

## 2015-06-06 NOTE — Patient Instructions (Addendum)
You need 3 L continuous oxygen  You have part of left upper lung collapse Referral to oncology-Dr. Julien Nordmann If no further treatment planned, we discussed hospice care as a source of support  Stay on Spiriva and DuoNeb's every 6 hours Use flutter valve every 3-4 hours

## 2015-06-07 ENCOUNTER — Telehealth: Payer: Self-pay | Admitting: *Deleted

## 2015-06-07 DIAGNOSIS — C3492 Malignant neoplasm of unspecified part of left bronchus or lung: Secondary | ICD-10-CM

## 2015-06-07 NOTE — Telephone Encounter (Signed)
Oncology Nurse Navigator Documentation  Oncology Nurse Navigator Flowsheets 06/07/2015  Navigator Encounter Type Introductory phone call  Treatment Phase Pre-Tx/Tx Discussion  Barriers/Navigation Needs Coordination of Care  Interventions Coordination of Care  Coordination of Care Appts  Acuity Level 2  Time Spent with Patient 30   I received a referral from Dr. Bari Mantis office yesterday.  I updated Dr. Isidore Moos of appt.  She stated it was ok for Dr. Julien Nordmann to see Kelsey Garcia.  I called Ms. Nazareno today.  I gave her an appt to see Dr. Julien Nordmann on 06/10/15 arrive at 1:45.  She verbalized understanding of appt time and place.

## 2015-06-10 ENCOUNTER — Telehealth: Payer: Self-pay | Admitting: *Deleted

## 2015-06-10 ENCOUNTER — Ambulatory Visit: Payer: Medicare Other | Admitting: Internal Medicine

## 2015-06-10 ENCOUNTER — Encounter: Payer: Self-pay | Admitting: *Deleted

## 2015-06-10 ENCOUNTER — Other Ambulatory Visit: Payer: Medicare Other

## 2015-06-10 NOTE — Telephone Encounter (Signed)
Oncology Nurse Navigator Documentation  Oncology Nurse Navigator Flowsheets 06/10/2015  Navigator Encounter Type Telephone  Telephone Outgoing Call  Treatment Phase Pre-Tx/Tx Discussion  Barriers/Navigation Needs Coordination of Care  Interventions Coordination of Care  Coordination of Care Appts  Acuity Level 1  Time Spent with Patient 15   I called today to check on Kelsey Garcia.  She did not make her appt with Dr. Julien Nordmann today.  I gave her another appt for tomorrow.  She will be able to be seen tomorrow she stated.

## 2015-06-11 ENCOUNTER — Encounter: Payer: Self-pay | Admitting: Internal Medicine

## 2015-06-11 ENCOUNTER — Ambulatory Visit (HOSPITAL_BASED_OUTPATIENT_CLINIC_OR_DEPARTMENT_OTHER): Payer: Medicare Other | Admitting: Internal Medicine

## 2015-06-11 ENCOUNTER — Other Ambulatory Visit (HOSPITAL_BASED_OUTPATIENT_CLINIC_OR_DEPARTMENT_OTHER): Payer: Medicare Other

## 2015-06-11 ENCOUNTER — Other Ambulatory Visit: Payer: Self-pay | Admitting: *Deleted

## 2015-06-11 ENCOUNTER — Telehealth: Payer: Self-pay | Admitting: Internal Medicine

## 2015-06-11 VITALS — BP 93/56 | HR 87 | Temp 98.5°F | Resp 19 | Ht 65.0 in | Wt 163.5 lb

## 2015-06-11 DIAGNOSIS — M810 Age-related osteoporosis without current pathological fracture: Secondary | ICD-10-CM

## 2015-06-11 DIAGNOSIS — C3492 Malignant neoplasm of unspecified part of left bronchus or lung: Secondary | ICD-10-CM

## 2015-06-11 DIAGNOSIS — Z800 Family history of malignant neoplasm of digestive organs: Secondary | ICD-10-CM

## 2015-06-11 DIAGNOSIS — Z803 Family history of malignant neoplasm of breast: Secondary | ICD-10-CM

## 2015-06-11 DIAGNOSIS — I10 Essential (primary) hypertension: Secondary | ICD-10-CM

## 2015-06-11 DIAGNOSIS — J9819 Other pulmonary collapse: Secondary | ICD-10-CM

## 2015-06-11 DIAGNOSIS — I4891 Unspecified atrial fibrillation: Secondary | ICD-10-CM | POA: Diagnosis not present

## 2015-06-11 DIAGNOSIS — Z87898 Personal history of other specified conditions: Secondary | ICD-10-CM

## 2015-06-11 DIAGNOSIS — C3412 Malignant neoplasm of upper lobe, left bronchus or lung: Secondary | ICD-10-CM

## 2015-06-11 DIAGNOSIS — Z87891 Personal history of nicotine dependence: Secondary | ICD-10-CM

## 2015-06-11 LAB — CBC WITH DIFFERENTIAL/PLATELET
BASO%: 0.5 % (ref 0.0–2.0)
BASOS ABS: 0 10*3/uL (ref 0.0–0.1)
EOS ABS: 0.1 10*3/uL (ref 0.0–0.5)
EOS%: 2.6 % (ref 0.0–7.0)
HCT: 30.6 % — ABNORMAL LOW (ref 34.8–46.6)
HEMOGLOBIN: 9.2 g/dL — AB (ref 11.6–15.9)
LYMPH%: 15.6 % (ref 14.0–49.7)
MCH: 26.3 pg (ref 25.1–34.0)
MCHC: 30.2 g/dL — ABNORMAL LOW (ref 31.5–36.0)
MCV: 87 fL (ref 79.5–101.0)
MONO#: 0.5 10*3/uL (ref 0.1–0.9)
MONO%: 10 % (ref 0.0–14.0)
NEUT%: 71.3 % (ref 38.4–76.8)
NEUTROS ABS: 3.5 10*3/uL (ref 1.5–6.5)
PLATELETS: 253 10*3/uL (ref 145–400)
RBC: 3.51 10*6/uL — ABNORMAL LOW (ref 3.70–5.45)
RDW: 16.6 % — AB (ref 11.2–14.5)
WBC: 4.9 10*3/uL (ref 3.9–10.3)
lymph#: 0.8 10*3/uL — ABNORMAL LOW (ref 0.9–3.3)

## 2015-06-11 LAB — COMPREHENSIVE METABOLIC PANEL
ALBUMIN: 2.8 g/dL — AB (ref 3.5–5.0)
ALK PHOS: 86 U/L (ref 40–150)
ALT: <9 U/L (ref 0–55)
ANION GAP: 8 meq/L (ref 3–11)
AST: 10 U/L (ref 5–34)
BILIRUBIN TOTAL: 0.35 mg/dL (ref 0.20–1.20)
BUN: 14.7 mg/dL (ref 7.0–26.0)
CO2: 34 mEq/L — ABNORMAL HIGH (ref 22–29)
Calcium: 8.9 mg/dL (ref 8.4–10.4)
Chloride: 101 mEq/L (ref 98–109)
Creatinine: 0.8 mg/dL (ref 0.6–1.1)
EGFR: 76 mL/min/{1.73_m2} — AB (ref >90)
Glucose: 87 mg/dl (ref 70–140)
POTASSIUM: 3.6 meq/L (ref 3.5–5.1)
Sodium: 143 mEq/L (ref 136–145)
TOTAL PROTEIN: 5.9 g/dL — AB (ref 6.4–8.3)

## 2015-06-11 NOTE — Patient Outreach (Addendum)
[facility]) Care Management  06/11/2015  Kelsey Garcia 07-Jan-1946 [id number]   Assessment: Care coordination call  Received message (phone) from care management assistant Markham Jordan) that patient called requesting to reschedule the appointment for routine home visit on 5/5 Friday to another date due to doctor's appointment.   Call placed and spoke with patient and states that her oncologist (Dr.Mohamed) wanted to see her on 5/5 because of "left lung collapse" and cancer "spreading out". She prefers to see oncologist on her own. Patient states will not be able to see care management coordinator on that day for the scheduled home visit. Patient reports that her cough, mucus production and shortness of breath has not been more than usual. She mentioned continuing to take Doxycycline and use of home oxygen and breathing treatments.    Patient refused to give a specific reschedule date and requests that she will call care management coordinator next week, instead.  Patient further states that she will have to see what the oncologist will recommend either Hospice or Palliative care. She verbalized that she will call next week to give an update on her status. Care management coordinator provided contact number to patient.  Patient denies any immediate needs at this time. She is aware to call [facility] management coordinator or 24 hour nurse line when needed.   Plan: Will await for patient's call to get updates of her status as she prefers.. Will call patient back if unable to receive a call from her towards the end of next week (to reschedule home visit).  Kimbly Eanes A. Mackenzey Crownover, BSN, RN-BC [place] Management Coordinator Cell: [phone]

## 2015-06-11 NOTE — Telephone Encounter (Signed)
reply from Dr Julien Nordmann to [facility] book-sent MW email to adv Dr Blima Rich wants that ppt trmt on same day

## 2015-06-11 NOTE — Progress Notes (Signed)
[place] [phone]   [phone]  CONSULT NOTE  REFERRING PHYSICIAN: Dr. Kara Mead  REASON FOR CONSULTATION:  70 years old white female with recently diagnosed lung cancer.  HPI Kelsey Garcia is a 70 y.o. female with past medical history significant for multiple medical problems including history of hypertension COPD, dyslipidemia, depression and anxiety, obsessive-compulsive disorder, osteoporosis, atrial fibrillation with rapid ventricular rate, GERD, congestive heart failure and long history of smoking. The patient has been complaining of increasing shortness of breath and cough for several years. She was diagnosed with recurrent pneumonia several times. Chest x-ray on 02/23/2015 showed persistent infiltrate in the superior lingula. This was followed by CT scan of the chest without contrast on 02/25/2015 and it showed stable appearance of concurrent consolidation in the lingula suspicious for neoplasm because of the persistent of this condition. On 02/19/2015 the patient underwent bronchoscopy under the care of Dr. Halford Chessman. The final pathology (Accession: [id number]) of the bronchial brushing of the lingula showed malignant cells consistent with squamous cell carcinoma. CT of the abdomen and pelvis on 02/28/2015 was negative for metastatic disease. MRI of the brain on 03/01/2015 was negative for metastatic disease. The patient was referred to Dr. Isidore Moos and a PET scan was performed on 03/13/2015 and it showed small solitary focus of hyper metabolic activity in the left hilum with SUV max of 4.1 without evidence of corresponding mass by CT. This likely corresponds to endobronchial malignancy. There was also improvement in the lingular airspace disease with low-grade hypermetabolic activity likely due to improving postobstructive pneumonitis. There was no other metastatic disease identified. The patient underwent curative radiotherapy for a total of 50 GY in 10  fractions completed 04/15/2015 under the care of Dr. Isidore Moos. She has been feeling fine except for the baseline shortness of breath and cough. She was seen twice in the emergency department on 03/30/2015 as well as 06/01/2015 and repeat CT scan of the chest on 06/01/2015 showed changes consistent with left upper lobe collapse. The patient was seen recently by [name] and he kindly referred her to me today for evaluation and recommendation regarding her condition and the persistent collapse of the left upper lobe. When seen today she continues to complain of shortness of breath and she is currently on home oxygen. She also has productive cough but no hemoptysis. She lost around 15 pounds since December 2016. She has intermittent nausea with no vomiting. She also has occasional headache with no visual changes. Family history significant for mother with lung disease, father died from accident secondary to alcoholism, maternal aunt had breast cancer and brother had colon cancer. The patient is single and has 2 daughters and 2 granddaughters. She worked in several jobs [employer]. She had a history of smoking 1.5 pack per day for around 38 years and quit in October 2016. She also has a history of alcohol and drug abuse in the past but not recently.  HPI  Past Medical History  Diagnosis Date  . Atrial fibrillation ([place])   . Hypertension   . Insomnia   . Nonischemic cardiomyopathy ([place])   . Hyperlipidemia   . Anxiety   . Cholelithiasis   . Insomnia   . Mitral regurgitation     noted 2010  . H/O epistaxis   . Rhinitis, allergic   . CHF (congestive heart failure) ([place])   . Pneumonia     "several times w/exacerbations of the COPD; nothing in the last year" (07/15/2012)  .  COPD (chronic obstructive pulmonary disease) ([place])     as of 7/13 on 2-3L, pfts 10/2008 with mod obstruction  . Chronic bronchitis with COPD (chronic obstructive pulmonary disease) ([place])   . Shortness of breath      "all the time right now" (07/15/2012)  . GERD (gastroesophageal reflux disease)   . WJXBJYNW(295.6)     "weekly" (07/15/2012)  . Migraines     "weekly for awhile; cleared up as I got older" (07/15/2012)  . DJD (degenerative joint disease)   . Arthritis     "all over" (07/15/2012)  . OCD (obsessive compulsive disorder)   . OCD (obsessive compulsive disorder)   . Depression     h/o SI; "last time I was really serious about it was ~ 1997" (07/15/2012)  . Thoracic vertebral fracture ([place]) 11/03/2014  . Primary lung cancer [facility])     Past Surgical History  Procedure Laterality Date  . Tubal ligation  1972  . Cardioversion  2003; 07/2003  . Video bronchoscopy Bilateral 02/27/2015    Procedure: VIDEO BRONCHOSCOPY WITH FLUORO;  Surgeon: Chesley Mires, MD;  Location: [facility];  Service: Cardiopulmonary;  Laterality: Bilateral;    Family History  Problem Relation Age of Onset  . Asthma    . Emphysema    . Allergies    . Cancer      aunt had several types of cancer  . COPD Mother   . Emphysema Mother   . Cirrhosis Father     Social History Social History  Substance Use Topics  . Smoking status: Former Smoker -- 1.00 packs/day for 35 years    Types: Cigarettes    Quit date: 06/18/2014  . Smokeless tobacco: Never Used  . Alcohol Use: No    Allergies  Allergen Reactions  . Ace Inhibitors Cough  . Codeine Swelling    Face and lips swelling  Patient report Can tolerate hydrocodone  . Pseudoeph-Doxylamine-Dm-Apap Itching  . Diphenhydramine Hcl Other (See Comments)    "feels like my skin is crawling, and legs twitches"  . Erythromycin Diarrhea  . Nsaids Nausea And Vomiting  . Nyquil [Pseudoeph-Doxylamine-Dm-Apap] Itching  . Tramadol Hcl Other (See Comments)    stomach pain, hallucinations  . Fosamax [Alendronate] Other (See Comments)    Nausea and abdominal bloating.   . Tomato Other (See Comments)    Stomach swells  . Orange Fruit [Citrus] Rash  . Pseudoephedrine Palpitations     Current Outpatient Prescriptions  Medication Sig Dispense Refill  . aspirin 325 MG EC tablet Take 650 mg by mouth daily with breakfast.     . budesonide-formoterol (SYMBICORT) 160-4.5 MCG/ACT inhaler Inhale 2 puffs into the lungs 2 (two) times daily.    . busPIRone (BUSPAR) 10 MG tablet Take 10 mg by mouth 3 (three) times daily.    . ciprofloxacin (CIPRO) 750 MG tablet Take 1 tablet (750 mg total) by mouth 2 (two) times daily. (Patient not taking: Reported on 06/11/2015) 14 tablet 0  . diazepam (VALIUM) 2 MG tablet 1 by mouth every 6 hours as needed 30 tablet 0  . diltiazem (CARDIZEM) 120 MG tablet Take 1 tablet (120 mg total) by mouth every 8 (eight) hours. 90 tablet 0  . doxycycline (VIBRAMYCIN) 100 MG capsule Take 1 capsule (100 mg total) by mouth 2 (two) times daily. 20 capsule 0  . FLUoxetine (PROZAC) 20 MG capsule Take 3 capsules by mouth once daily    . furosemide (LASIX) 20 MG tablet Take 20 mg by mouth daily.     Marland Kitchen  guaiFENesin (MUCINEX) 600 MG 12 hr tablet Take 600 mg by mouth 2 (two) times daily as needed for cough.    Marland Kitchen HYDROcodone-acetaminophen (NORCO/VICODIN) 5-325 MG tablet Take 1 tablet by mouth every 4 (four) hours as needed. 10 tablet 0  . HYDROcodone-homatropine (HYCODAN) 5-1.5 MG/5ML syrup Take 2.5 mLs by mouth 2 (two) times daily as needed for cough. 60 mL 0  . ipratropium-albuterol (DUONEB) 0.5-2.5 (3) MG/3ML SOLN Take 3 mLs by nebulization 3 (three) times daily. 360 mL 3  . levofloxacin (LEVAQUIN) 500 MG tablet Take 1 tablet (500 mg total) by mouth daily. 7 tablet 0  . metoprolol succinate (TOPROL-XL) 25 MG 24 hr tablet Take 1 tablet (25 mg total) by mouth daily. 90 tablet 3  . OXYGEN Inhale into the lungs. 2-3L/min    . pantoprazole (PROTONIX) 40 MG tablet TAKE ONE TABLET BY MOUTH TWICE DAILY 60 tablet 5  . potassium chloride SA (KLOR-CON M20) 20 MEQ tablet Take 20 mEq by mouth daily.     Marland Kitchen SPIRIVA HANDIHALER 18 MCG inhalation capsule PLACE ONE CAPSULE INTO INHALER AND  INHALE DAILY 90 capsule 0  . VENTOLIN HFA 108 (90 Base) MCG/ACT inhaler INHALE ONE TO TWO PUFFS BY MOUTH EVERY 6 HOURS AS NEEDED FOR WHEEZING 18 each 0   No current facility-administered medications for this visit.    Review of Systems  Constitutional: positive for fatigue Eyes: negative Ears, nose, mouth, throat, and face: negative Respiratory: positive for cough, dyspnea on exertion, sputum and wheezing Cardiovascular: negative Gastrointestinal: negative Genitourinary:negative Integument/breast: negative Hematologic/lymphatic: negative Musculoskeletal:positive for muscle weakness Neurological: negative Behavioral/Psych: negative Endocrine: negative Allergic/Immunologic: negative  Physical Exam  LDJ:TTSVX, healthy, no distress, well nourished and well developed SKIN: skin color, texture, turgor are normal, no rashes or significant lesions HEAD: Normocephalic, No masses, lesions, tenderness or abnormalities EYES: normal, PERRLA, Conjunctiva are pink and non-injected EARS: External ears normal, Canals clear OROPHARYNX:no exudate, no erythema and lips, buccal mucosa, and tongue normal  NECK: supple, no adenopathy, no JVD LYMPH:  no palpable lymphadenopathy, no hepatosplenomegaly LUNGS: prolonged expiratory phase, expiratory wheezes bilaterally, scattered rhonchi bilaterally HEART: regular rate & rhythm, no murmurs and no gallops ABDOMEN:abdomen soft, non-tender, normal bowel sounds and no masses or organomegaly BACK: Back symmetric, no curvature., No CVA tenderness EXTREMITIES:no joint deformities, effusion, or inflammation, no edema, no skin discoloration  NEURO: alert & oriented x 3 with fluent speech, no focal motor/sensory deficits  PERFORMANCE STATUS: ECOG 1-2  LABORATORY DATA: Lab Results  Component Value Date   WBC 4.9 06/11/2015   HGB 9.2* 06/11/2015   HCT 30.6* 06/11/2015   MCV 87.0 06/11/2015   PLT 253 06/11/2015      Chemistry      Component Value  Date/Time   NA 143 06/11/2015 1402   NA 141 06/01/2015 1416   NA 145 04/26/2015   K 3.6 06/11/2015 1402   K 3.4* 06/01/2015 1416   K 4.5 04/15/2015   CL 99* 06/01/2015 1416   CO2 34* 06/11/2015 1402   CO2 32 04/05/2015 0602   BUN 14.7 06/11/2015 1402   BUN 12 06/01/2015 1416   BUN 15 04/26/2015   CREATININE 0.8 06/11/2015 1402   CREATININE 0.70 06/01/2015 1416   CREATININE 0.5 04/26/2015   CREATININE 0.71 06/15/2014 1008   GLU 92 04/26/2015      Component Value Date/Time   CALCIUM 8.9 06/11/2015 1402   CALCIUM 9.1 04/05/2015 0602   ALKPHOS 86 06/11/2015 1402   ALKPHOS 57 04/26/2015   AST  10 06/11/2015 1402   AST 8* 04/26/2015   ALT <9 06/11/2015 1402   ALT 12 04/26/2015   BILITOT 0.35 06/11/2015 1402   BILITOT 1.0 04/04/2015 0858       RADIOGRAPHIC STUDIES: Dg Chest 2 View  06/01/2015  CLINICAL DATA:  70 year old female with congestion and cough. History of prior lung cancer. EXAM: CHEST  2 VIEW COMPARISON:  Prior chest x-ray 04/04/2015 FINDINGS: Hazy opacity throughout the left hemi thorax confined to the anterior aspect of the chest concerning for left upper lobar atelectasis. There appears to be some compensatory hypertrophy of the left lower lobe. Otherwise, stable cardiomegaly. Tortuous and atherosclerotic thoracic aorta. Chronic bronchitic change and interstitial prominence again noted. No acute osseous abnormality. IMPRESSION: 1. Chest x-ray findings are concerning for interval development of left upper lobe collapse. Obstruction of the left upper lobe bronchi versus recurrent neoplastic disease with central obstructing mass. Consider further evaluation with bronchoscopy, or CT scan of the chest with contrast. 2. Stable cardiomegaly. Electronically Signed   By: Jacqulynn Cadet M.D.   On: 06/01/2015 14:34   Ct Chest W Contrast  06/01/2015  CLINICAL DATA:  Cough for 1 week with history of lung carcinoma, recent chest x-ray suggesting left upper lobe collapse. EXAM: CT  CHEST WITH CONTRAST TECHNIQUE: Multidetector CT imaging of the chest was performed during intravenous contrast administration. CONTRAST:  64m ISOVUE-300 IOPAMIDOL (ISOVUE-300) INJECTION 61% COMPARISON:  03/30/2015, 06/01/2015 FINDINGS: The lungs now demonstrate near complete collapse of the left upper lobe along the mediastinum. The central hilar soft tissues again demonstrates some soft tissue density surrounding the upper lobe bronchus. Patchy nodular changes are noted in the right upper lobe as well as some diffuse interstitial change. This may represent some acute pneumonic infiltrate. No other focal abnormality on the left is seen. The thoracic inlet is within normal limits. The thoracic aorta shows no aneurysmal dilatation or dissection. The pulmonary artery is incompletely evaluated for pulmonary emboli although no large central embolus is noted. Precarinal lymph node is again identified and stable from previous exams. No other significant lymphadenopathy is noted. The visualized upper abdomen demonstrates multiple gallstones. No acute bony abnormality is seen. IMPRESSION: Changes consistent with left upper lobe collapse. This is consistent with the patient's underlying clinical history and is new from the prior exam. New somewhat nodular changes in the right upper lobe as well as some interstitial infiltrative changes. This may represent acute superimposed pneumonic infiltrate. Short-term followup following appropriate therapy is recommended. Multiple gallstones are again seen. Electronically Signed   By: MInez CatalinaM.D.   On: 06/01/2015 17:13    ASSESSMENT: This is a very pleasant 70years old white female with questionably stage IIB (T3, N0, M0) non-small cell lung cancer, squamous cell carcinoma presented with left upper lobe obstructing lesion diagnosed in January 2017 status post curative radiotherapy. Her recent CT scan of the chest showed persistent left upper lobe collapse. This could be  secondary to her prior curative radiotherapy but underlying disease recurrence could not excluded at this point.  PLAN: I had a lengthy discussion with the patient today about her disease condition and further investigation to rule out any disease recurrence as well as treatment options. I recommended for the patient to have repeat PET scan in few weeks for evaluation of the left upper lobe collapse and to rule out any disease recurrence. Her last radiotherapy treatment was in early March 2017 and I think waiting for a few more weeks may be helpful to  differentiate between the effect of radiotherapy and disease recurrence. If she has any evidence for disease recurrence, the patient may need repeat bronchoscopy for more tissue to send for PDL 1 expression as she may be a candidate for immunotherapy with Ketruda (pembrolizumab) if she has PDL 1 expression 50% or more. I will see the patient back for follow-up visit in one month for evaluation and discussion of her PET scan results. She was advised to call immediately if she has any concerning symptoms in the interval. The patient voices understanding of current disease status and treatment options and is in agreement with the current care plan.  All questions were answered. The patient knows to call the clinic with any problems, questions or concerns. We can certainly see the patient much sooner if necessary.  Thank you so much for allowing me to participate in the care of Kelsey Garcia. I will continue to follow up the patient with you and assist in her care.  I spent 40 minutes counseling the patient face to face. The total time spent in the appointment was 60 minutes.  Disclaimer: This note was dictated with voice recognition software. Similar sounding words can inadvertently be transcribed and may not be corrected upon review.   Jadi Deyarmin K. Jun 11, 2015, 3:11 PM

## 2015-06-12 ENCOUNTER — Other Ambulatory Visit: Payer: Self-pay

## 2015-06-12 ENCOUNTER — Emergency Department (HOSPITAL_COMMUNITY)
Admission: EM | Admit: 2015-06-12 | Discharge: 2015-06-12 | Disposition: A | Discharge status: Home / Self Care | Payer: Medicare Other | Attending: Emergency Medicine | Admitting: Emergency Medicine

## 2015-06-12 ENCOUNTER — Emergency Department (HOSPITAL_COMMUNITY): Payer: Medicare Other

## 2015-06-12 ENCOUNTER — Encounter (HOSPITAL_COMMUNITY): Payer: Self-pay | Admitting: Emergency Medicine

## 2015-06-12 DIAGNOSIS — J9612 Chronic respiratory failure with hypercapnia: Secondary | ICD-10-CM | POA: Diagnosis not present

## 2015-06-12 DIAGNOSIS — Z7982 Long term (current) use of aspirin: Secondary | ICD-10-CM | POA: Insufficient documentation

## 2015-06-12 DIAGNOSIS — Z8669 Personal history of other diseases of the nervous system and sense organs: Secondary | ICD-10-CM | POA: Diagnosis not present

## 2015-06-12 DIAGNOSIS — Z8781 Personal history of (healed) traumatic fracture: Secondary | ICD-10-CM | POA: Diagnosis not present

## 2015-06-12 DIAGNOSIS — I4891 Unspecified atrial fibrillation: Secondary | ICD-10-CM | POA: Diagnosis not present

## 2015-06-12 DIAGNOSIS — I10 Essential (primary) hypertension: Secondary | ICD-10-CM | POA: Diagnosis not present

## 2015-06-12 DIAGNOSIS — C3492 Malignant neoplasm of unspecified part of left bronchus or lung: Secondary | ICD-10-CM | POA: Diagnosis not present

## 2015-06-12 DIAGNOSIS — Z8701 Personal history of pneumonia (recurrent): Secondary | ICD-10-CM | POA: Diagnosis not present

## 2015-06-12 DIAGNOSIS — K219 Gastro-esophageal reflux disease without esophagitis: Secondary | ICD-10-CM | POA: Diagnosis not present

## 2015-06-12 DIAGNOSIS — F419 Anxiety disorder, unspecified: Secondary | ICD-10-CM | POA: Insufficient documentation

## 2015-06-12 DIAGNOSIS — J441 Chronic obstructive pulmonary disease with (acute) exacerbation: Secondary | ICD-10-CM | POA: Insufficient documentation

## 2015-06-12 DIAGNOSIS — F329 Major depressive disorder, single episode, unspecified: Secondary | ICD-10-CM | POA: Diagnosis not present

## 2015-06-12 DIAGNOSIS — Z79899 Other long term (current) drug therapy: Secondary | ICD-10-CM | POA: Insufficient documentation

## 2015-06-12 DIAGNOSIS — R0602 Shortness of breath: Secondary | ICD-10-CM | POA: Diagnosis present

## 2015-06-12 DIAGNOSIS — I509 Heart failure, unspecified: Secondary | ICD-10-CM | POA: Diagnosis not present

## 2015-06-12 DIAGNOSIS — Z7951 Long term (current) use of inhaled steroids: Secondary | ICD-10-CM | POA: Insufficient documentation

## 2015-06-12 DIAGNOSIS — M1990 Unspecified osteoarthritis, unspecified site: Secondary | ICD-10-CM | POA: Diagnosis not present

## 2015-06-12 DIAGNOSIS — Z87891 Personal history of nicotine dependence: Secondary | ICD-10-CM | POA: Insufficient documentation

## 2015-06-12 LAB — BLOOD GAS, ARTERIAL
Acid-Base Excess: 8.6 mmol/L — ABNORMAL HIGH (ref 0.0–2.0)
Bicarbonate: 35.5 mEq/L — ABNORMAL HIGH (ref 20.0–24.0)
DRAWN BY: 331471
O2 CONTENT: 3 L/min
O2 Saturation: 93.9 %
PH ART: 7.343 — AB (ref 7.350–7.450)
Patient temperature: 98.6
TCO2: 33.5 mmol/L (ref 0–100)
pCO2 arterial: 67.2 mmHg (ref 35.0–45.0)
pO2, Arterial: 72.9 mmHg — ABNORMAL LOW (ref 80.0–100.0)

## 2015-06-12 LAB — BASIC METABOLIC PANEL
Anion gap: 8 (ref 5–15)
BUN: 13 mg/dL (ref 6–20)
CALCIUM: 8.8 mg/dL — AB (ref 8.9–10.3)
CO2: 36 mmol/L — AB (ref 22–32)
CREATININE: 0.61 mg/dL (ref 0.44–1.00)
Chloride: 100 mmol/L — ABNORMAL LOW (ref 101–111)
GFR calc Af Amer: >60 mL/min (ref >60)
GFR calc non Af Amer: >60 mL/min (ref >60)
GLUCOSE: 127 mg/dL — AB (ref 65–99)
Potassium: 4.1 mmol/L (ref 3.5–5.1)
Sodium: 144 mmol/L (ref 135–145)

## 2015-06-12 LAB — CBC
HCT: 33.7 % — ABNORMAL LOW (ref 36.0–46.0)
HEMOGLOBIN: 9.5 g/dL — AB (ref 12.0–15.0)
MCH: 26.5 pg (ref 26.0–34.0)
MCHC: 28.2 g/dL — AB (ref 30.0–36.0)
MCV: 94.1 fL (ref 78.0–100.0)
Platelets: 249 10*3/uL (ref 150–400)
RBC: 3.58 MIL/uL — ABNORMAL LOW (ref 3.87–5.11)
RDW: 15.3 % (ref 11.5–15.5)
WBC: 6.3 10*3/uL (ref 4.0–10.5)

## 2015-06-12 LAB — LACTIC ACID, PLASMA: Lactic Acid, Venous: 0.8 mmol/L (ref 0.5–2.0)

## 2015-06-12 LAB — BRAIN NATRIURETIC PEPTIDE: B NATRIURETIC PEPTIDE 5: 551 pg/mL — AB (ref 0.0–100.0)

## 2015-06-12 LAB — TROPONIN I: Troponin I: <0.03 ng/mL (ref <0.031)

## 2015-06-12 LAB — D-DIMER, QUANTITATIVE: D-Dimer, Quant: 1.18 ug/mL-FEU — ABNORMAL HIGH (ref 0.00–0.50)

## 2015-06-12 MED ORDER — PREDNISONE 10 MG (21) PO TBPK: 10.0000 mg | ORAL_TABLET | Freq: Every day | ORAL | Status: DC

## 2015-06-12 MED ORDER — DILTIAZEM HCL 25 MG/5ML IV SOLN
10.0000 mg | Freq: Once | INTRAVENOUS | Status: AC
  Administered 2015-06-12: 10 mg via INTRAVENOUS
  Filled 2015-06-12: qty 5

## 2015-06-12 MED ORDER — IPRATROPIUM-ALBUTEROL 0.5-2.5 (3) MG/3ML IN SOLN
3.0000 mL | Freq: Once | RESPIRATORY_TRACT | Status: AC
  Administered 2015-06-12: 3 mL via RESPIRATORY_TRACT
  Filled 2015-06-12: qty 3

## 2015-06-12 NOTE — ED Provider Notes (Signed)
CSN: [id number]     Arrival date & time 06/12/15  1437 History   First MD Initiated Contact with Patient 06/12/15 1458     Chief Complaint  Patient presents with  . Respiratory Distress   PT IS HERE WITH SOB.  SHE HAS A HX OF COPD AND LUNG CA TX'D WITH RADIATION.  PT SAW HER PCP YESTERDAY AT THE OFFICE AND HAS HAD SOB SINCE THEN.  PT HAS HAD 15 MG OF ALBUTEROL TODAY AND 125 MG OF SOLUMEDROL WAS GIVEN BY EMS.  (Consider location/radiation/quality/duration/timing/severity/associated sxs/prior Treatment) The history is provided by the patient.    Past Medical History  Diagnosis Date  . Atrial fibrillation ([place])   . Hypertension   . Insomnia   . Nonischemic cardiomyopathy ([place])   . Hyperlipidemia   . Anxiety   . Cholelithiasis   . Insomnia   . Mitral regurgitation     noted 2010  . H/O epistaxis   . Rhinitis, allergic   . CHF (congestive heart failure) ([place])   . Pneumonia     "several times w/exacerbations of the COPD; nothing in the last year" (07/15/2012)  . COPD (chronic obstructive pulmonary disease) ([place])     as of 7/13 on 2-3L, pfts 10/2008 with mod obstruction  . Chronic bronchitis with COPD (chronic obstructive pulmonary disease) ([place])   . Shortness of breath     "all the time right now" (07/15/2012)  . GERD (gastroesophageal reflux disease)   . EVOJJKKX(381.8)     "weekly" (07/15/2012)  . Migraines     "weekly for awhile; cleared up as I got older" (07/15/2012)  . DJD (degenerative joint disease)   . Arthritis     "all over" (07/15/2012)  . OCD (obsessive compulsive disorder)   . OCD (obsessive compulsive disorder)   . Depression     h/o SI; "last time I was really serious about it was ~ 1997" (07/15/2012)  . Thoracic vertebral fracture ([place]) 11/03/2014  . Primary lung cancer [facility])    Past Surgical History  Procedure Laterality Date  . Tubal ligation  1972  . Cardioversion  2003; 07/2003  . Video bronchoscopy Bilateral 02/27/2015    Procedure: VIDEO BRONCHOSCOPY WITH  FLUORO;  Surgeon: Chesley Mires, MD;  Location: [facility];  Service: Cardiopulmonary;  Laterality: Bilateral;   Family History  Problem Relation Age of Onset  . Asthma    . Emphysema    . Allergies    . Cancer      aunt had several types of cancer  . COPD Mother   . Emphysema Mother   . Cirrhosis Father    Social History  Substance Use Topics  . Smoking status: Former Smoker -- 1.00 packs/day for 35 years    Types: Cigarettes    Quit date: 06/18/2014  . Smokeless tobacco: Never Used  . Alcohol Use: No   OB History    No data available     Review of Systems  Respiratory: Positive for cough, shortness of breath and wheezing.   All other systems reviewed and are negative.     Allergies  Ace inhibitors; Codeine; Pseudoeph-doxylamine-dm-apap; Diphenhydramine hcl; Erythromycin; Nsaids; Nyquil; Tramadol hcl; Fosamax; Tomato; Orange fruit; and Pseudoephedrine  Home Medications   Prior to Admission medications   Medication Sig Start Date End Date Taking? Authorizing Provider  aspirin 325 MG EC tablet Take 650 mg by mouth daily with breakfast.    Yes Historical Provider, MD  budesonide-formoterol (SYMBICORT) 160-4.5 MCG/ACT inhaler Inhale 2 puffs into  the lungs 2 (two) times daily.   Yes Historical Provider, MD  busPIRone (BUSPAR) 10 MG tablet Take 10 mg by mouth 3 (three) times daily. 04/22/14  Yes Historical Provider, MD  diazepam (VALIUM) 2 MG tablet 1 by mouth every 6 hours as needed 05/02/15  Yes Gerlene Fee, NP  diltiazem (CARDIZEM) 120 MG tablet Take 1 tablet (120 mg total) by mouth every 8 (eight) hours. 03/12/15  Yes Rigoberto Noel, MD  FLUoxetine (PROZAC) 20 MG capsule Take 3 capsules by mouth once daily   Yes Historical Provider, MD  furosemide (LASIX) 20 MG tablet Take 20 mg by mouth daily.    Yes Historical Provider, MD  guaiFENesin (MUCINEX) 600 MG 12 hr tablet Take 600 mg by mouth 2 (two) times daily as needed for cough.   Yes Historical Provider, MD   HYDROcodone-acetaminophen (NORCO) 10-325 MG tablet Take 1 tablet by mouth every 6 (six) hours as needed for moderate pain.  06/04/15  Yes Historical Provider, MD  HYDROcodone-acetaminophen (NORCO/VICODIN) 5-325 MG tablet Take 1 tablet by mouth every 4 (four) hours as needed. 06/01/15  Yes Tanna Furry, MD  HYDROcodone-homatropine [facility]) 5-1.5 MG/5ML syrup Take 2.5 mLs by mouth 2 (two) times daily as needed for cough. 05/02/15  Yes Gerlene Fee, NP  ipratropium-albuterol (DUONEB) 0.5-2.5 (3) MG/3ML SOLN Take 3 mLs by nebulization 3 (three) times daily. 03/07/15  Yes Tammy S Parrett, NP  metoprolol succinate (TOPROL-XL) 25 MG 24 hr tablet Take 1 tablet (25 mg total) by mouth daily. Patient taking differently: Take 12.5 mg by mouth daily.  02/14/15  Yes Brittainy Erie Noe, PA-C  pantoprazole (PROTONIX) 40 MG tablet TAKE ONE TABLET BY MOUTH TWICE DAILY 01/03/14  Yes Tasrif Ahmed, MD  potassium chloride SA (KLOR-CON M20) 20 MEQ tablet Take 10 mEq by mouth 2 (two) times daily.    Yes Historical Provider, MD  SPIRIVA HANDIHALER 18 MCG inhalation capsule PLACE ONE CAPSULE INTO INHALER AND INHALE DAILY 03/07/15  Yes Rigoberto Noel, MD  VENTOLIN HFA 108 (90 Base) MCG/ACT inhaler INHALE ONE TO TWO PUFFS BY MOUTH EVERY 6 HOURS AS NEEDED FOR WHEEZING 03/07/15  Yes Rigoberto Noel, MD  ciprofloxacin (CIPRO) 750 MG tablet Take 1 tablet (750 mg total) by mouth 2 (two) times daily. Patient not taking: Reported on 06/11/2015 05/22/15   Fonnie Mu Parrett, NP  doxycycline (VIBRAMYCIN) 100 MG capsule Take 1 capsule (100 mg total) by mouth 2 (two) times daily. Patient not taking: Reported on 06/12/2015 06/01/15   Tanna Furry, MD  levofloxacin (LEVAQUIN) 500 MG tablet Take 1 tablet (500 mg total) by mouth daily. Patient not taking: Reported on 06/12/2015 06/06/15   Rigoberto Noel, MD  OXYGEN Inhale into the lungs. 2-3L/min    Historical Provider, MD  predniSONE (STERAPRED UNI-PAK 21 TAB) 10 MG (21) TBPK tablet Take 1 tablet (10 mg total)  by mouth daily. Take 6 tabs by mouth daily  for 2 days, then 5 tabs for 2 days, then 4 tabs for 2 days, then 3 tabs for 2 days, 2 tabs for 2 days, then 1 tab by mouth daily for 2 days 06/12/15   Isla Pence, MD   BP 142/103 mmHg  Pulse 113  Resp 20  SpO2 90% Physical Exam  Constitutional: She is oriented to person, place, and time. She appears well-developed and well-nourished.  HENT:  Head: Normocephalic and atraumatic.  Right Ear: External ear normal.  Left Ear: External ear normal.  Mouth/Throat: Oropharynx is clear  and moist.  Eyes: Conjunctivae are normal. Pupils are equal, round, and reactive to light.  Neck: Normal range of motion. Neck supple.  Cardiovascular: An irregularly irregular rhythm present. Tachycardia present.   Pulmonary/Chest: Effort normal. She has wheezes.  Abdominal: Soft. Bowel sounds are normal.  Musculoskeletal: Normal range of motion.  Neurological: She is alert and oriented to person, place, and time.  Skin: Skin is warm and dry.  Psychiatric: Her speech is delayed.  Nursing note and vitals reviewed.   ED Course  Procedures (including critical care time) Labs Review Labs Reviewed  BASIC METABOLIC PANEL - Abnormal; Notable for the following:    Chloride 100 (*)    CO2 36 (*)    Glucose, Bld 127 (*)    Calcium 8.8 (*)    All other components within normal limits  CBC - Abnormal; Notable for the following:    RBC 3.58 (*)    Hemoglobin 9.5 (*)    HCT 33.7 (*)    MCHC 28.2 (*)    All other components within normal limits  BRAIN NATRIURETIC PEPTIDE - Abnormal; Notable for the following:    B Natriuretic Peptide 551.0 (*)    All other components within normal limits  D-DIMER, QUANTITATIVE (NOT AT [facility] - Abnormal; Notable for the following:    D-Dimer, Quant 1.18 (*)    All other components within normal limits  BLOOD GAS, ARTERIAL - Abnormal; Notable for the following:    pH, Arterial 7.343 (*)    pCO2 arterial 67.2 (*)    pO2, Arterial 72.9  (*)    Bicarbonate 35.5 (*)    Acid-Base Excess 8.6 (*)    All other components within normal limits  CULTURE, BLOOD (ROUTINE X 2)  CULTURE, BLOOD (ROUTINE X 2)  TROPONIN I  LACTIC ACID, PLASMA  LACTIC ACID, PLASMA    Imaging Review Dg Chest 2 View  06/12/2015  CLINICAL DATA:  Increasing shortness of breath. Lung cancer. Atrial fibrillation. EXAM: CHEST  2 VIEW COMPARISON:  06/01/2015 FINDINGS: Stable atelectasis of the left upper lobe causing hazy density in the left hemithorax. Mild atelectasis in the left lower lobe. Continued indistinct opacity at the right lung apex, primarily interstitial but with some confluence. Atherosclerotic aortic arch.  Mild cardiomegaly. Upper thoracic kyphosis with wedging of several upper thoracic vertebra, as before. Indistinct pulmonary vasculature. IMPRESSION: 1. Cardiomegaly with suspected pulmonary venous hypertension. 2. Confluent interstitial accentuation at the right lung apex, unchanged. 3. Collapse of the left upper lobe. Mild atelectasis at the left lung base. 4. Atherosclerotic aortic arch. Electronically Signed   By: Van Clines M.D.   On: 06/12/2015 16:18   I have personally reviewed and evaluated these images and lab results as part of my medical decision-making.   EKG Interpretation None    MUSE NOT WORKING:  EKG:  HR 124.  AFIB WITH MULTIPLE PVCS.  PT IS TREMULOUS, SO BASELINE IS POOR.  MDM  PT IS FEELING BETTER.  PT IS SUPPOSED TO BE ON 2L OXYGEN AND HAS BEEN INCREASING IT TO 3 WHICH HAS BEEN INCREASING HER CO2 AND MAKING HER TIRED.  I TOLD HER TO GO BACK TO 2 AND SHE IS IN AGREEMENT.  PT'S HR IS HIGH (120S-130S)  FROM ALL THE NEBS.  I WILL GIVE HER A DOSE OF CARDIZEM PRIOR TO D/C.  PT KNOWS TO RETURN IF WORSE. Final diagnoses:  Acute exacerbation of chronic obstructive pulmonary disease (COPD) ([place])  Squamous cell lung cancer, left (HCC)  Chronic respiratory  failure with hypercapnia (HCC)  Atrial fibrillation with rapid  ventricular response (HCC)        Isla Pence, MD 06/12/15 1745

## 2015-06-12 NOTE — ED Notes (Signed)
MD at bedside. 

## 2015-06-12 NOTE — ED Notes (Signed)
Patient now on phone with daughter states she will come and get her.

## 2015-06-12 NOTE — ED Notes (Signed)
Bed: PC34 Expected date:  Expected time:  Means of arrival:  Comments: EMS/68F/ca pt/resp distress

## 2015-06-12 NOTE — ED Notes (Signed)
Pt with RESP DISTRESS since yesterdays office visit. Received total of 15 albuterol today, ems went out x1 and pt refused to come to hospital then fire gave albuterol. Received 125 solumedrol in route non rebreather sat 99-100% RA sat 88% confused but alert. States "I am just tired."

## 2015-06-12 NOTE — ED Notes (Signed)
This RN assisted pt in calling her daughter. The line was busy. Pt will try again in a few min

## 2015-06-12 NOTE — ED Notes (Signed)
[facility]  To be called.

## 2015-06-14 ENCOUNTER — Ambulatory Visit: Payer: Self-pay | Admitting: *Deleted

## 2015-06-15 ENCOUNTER — Emergency Department (HOSPITAL_COMMUNITY)
Admission: EM | Admit: 2015-06-15 | Discharge: 2015-06-15 | Disposition: A | Discharge status: Home / Self Care | Payer: Medicare Other | Attending: Emergency Medicine | Admitting: Emergency Medicine

## 2015-06-15 ENCOUNTER — Emergency Department (HOSPITAL_COMMUNITY): Payer: Medicare Other

## 2015-06-15 ENCOUNTER — Encounter (HOSPITAL_COMMUNITY): Payer: Self-pay | Admitting: Emergency Medicine

## 2015-06-15 DIAGNOSIS — R0602 Shortness of breath: Secondary | ICD-10-CM | POA: Diagnosis present

## 2015-06-15 DIAGNOSIS — I110 Hypertensive heart disease with heart failure: Secondary | ICD-10-CM | POA: Diagnosis not present

## 2015-06-15 DIAGNOSIS — F329 Major depressive disorder, single episode, unspecified: Secondary | ICD-10-CM | POA: Insufficient documentation

## 2015-06-15 DIAGNOSIS — I509 Heart failure, unspecified: Secondary | ICD-10-CM | POA: Diagnosis not present

## 2015-06-15 DIAGNOSIS — M1990 Unspecified osteoarthritis, unspecified site: Secondary | ICD-10-CM | POA: Diagnosis not present

## 2015-06-15 DIAGNOSIS — E785 Hyperlipidemia, unspecified: Secondary | ICD-10-CM | POA: Insufficient documentation

## 2015-06-15 DIAGNOSIS — Z79891 Long term (current) use of opiate analgesic: Secondary | ICD-10-CM | POA: Insufficient documentation

## 2015-06-15 DIAGNOSIS — Z792 Long term (current) use of antibiotics: Secondary | ICD-10-CM | POA: Diagnosis not present

## 2015-06-15 DIAGNOSIS — Z7952 Long term (current) use of systemic steroids: Secondary | ICD-10-CM | POA: Insufficient documentation

## 2015-06-15 DIAGNOSIS — Z7951 Long term (current) use of inhaled steroids: Secondary | ICD-10-CM | POA: Insufficient documentation

## 2015-06-15 DIAGNOSIS — Z79899 Other long term (current) drug therapy: Secondary | ICD-10-CM | POA: Diagnosis not present

## 2015-06-15 DIAGNOSIS — Z7982 Long term (current) use of aspirin: Secondary | ICD-10-CM | POA: Diagnosis not present

## 2015-06-15 DIAGNOSIS — Z85118 Personal history of other malignant neoplasm of bronchus and lung: Secondary | ICD-10-CM | POA: Diagnosis not present

## 2015-06-15 DIAGNOSIS — K219 Gastro-esophageal reflux disease without esophagitis: Secondary | ICD-10-CM | POA: Insufficient documentation

## 2015-06-15 DIAGNOSIS — J441 Chronic obstructive pulmonary disease with (acute) exacerbation: Secondary | ICD-10-CM

## 2015-06-15 DIAGNOSIS — Z87891 Personal history of nicotine dependence: Secondary | ICD-10-CM | POA: Insufficient documentation

## 2015-06-15 NOTE — ED Notes (Signed)
[facility] called for transport.  

## 2015-06-15 NOTE — Discharge Instructions (Signed)
Asthma, Acute Bronchospasm °Acute bronchospasm caused by asthma is also referred to as an asthma attack. Bronchospasm means your air passages become narrowed. The narrowing is caused by inflammation and tightening of the muscles in the air tubes (bronchi) in your lungs. This can make it hard to breathe or cause you to wheeze and cough. °CAUSES °Possible triggers are: °· Animal dander from the skin, hair, or feathers of animals. °· Dust mites contained in house dust. °· Cockroaches. °· Pollen from trees or grass. °· Mold. °· Cigarette or tobacco smoke. °· Air pollutants such as dust, household cleaners, hair sprays, aerosol sprays, paint fumes, strong chemicals, or strong odors. °· Cold air or weather changes. Cold air may trigger inflammation. Winds increase molds and pollens in the air. °· Strong emotions such as crying or laughing hard. °· Stress. °· Certain medicines such as aspirin or beta-blockers. °· Sulfites in foods and drinks, such as dried fruits and wine. °· Infections or inflammatory conditions, such as a flu, cold, or inflammation of the nasal membranes (rhinitis). °· Gastroesophageal reflux disease (GERD). GERD is a condition where stomach acid backs up into your esophagus. °· Exercise or strenuous activity. °SIGNS AND SYMPTOMS  °· Wheezing. °· Excessive coughing, particularly at night. °· Chest tightness. °· Shortness of breath. °DIAGNOSIS  °Your health care provider will ask you about your medical history and perform a physical exam. A chest X-ray or blood testing may be performed to look for other causes of your symptoms or other conditions that may have triggered your asthma attack.  °TREATMENT  °Treatment is aimed at reducing inflammation and opening up the airways in your lungs.  Most asthma attacks are treated with inhaled medicines. These include quick relief or rescue medicines (such as bronchodilators) and controller medicines (such as inhaled corticosteroids). These medicines are sometimes  given through an inhaler or a nebulizer. Systemic steroid medicine taken by mouth or given through an IV tube also can be used to reduce the inflammation when an attack is moderate or severe. Antibiotic medicines are only used if a bacterial infection is present.  °HOME CARE INSTRUCTIONS  °· Rest. °· Drink plenty of liquids. This helps the mucus to remain thin and be easily coughed up. Only use caffeine in moderation and do not use alcohol until you have recovered from your illness. °· Do not smoke. Avoid being exposed to secondhand smoke. °· You play a critical role in keeping yourself in good health. Avoid exposure to things that cause you to wheeze or to have breathing problems. °· Keep your medicines up-to-date and available. Carefully follow your health care provider's treatment plan. °· Take your medicine exactly as prescribed. °· When pollen or pollution is bad, keep windows closed and use an air conditioner or go to places with air conditioning. °· Asthma requires careful medical care. See your health care provider for a follow-up as advised. If you are more than [redacted] weeks pregnant and you were prescribed any new medicines, let your obstetrician know about the visit and how you are doing. Follow up with your health care provider as directed. °· After you have recovered from your asthma attack, make an appointment with your outpatient doctor to talk about ways to reduce the likelihood of future attacks. If you do not have a doctor who manages your asthma, make an appointment with a primary care doctor to discuss your asthma. °SEEK IMMEDIATE MEDICAL CARE IF:  °· You are getting worse. °· You have trouble breathing. If severe, call your local   emergency services (911 in the [place]  You develop chest pain or discomfort.  You are vomiting.  You are not able to keep fluids down.  You are coughing up yellow, green, brown, or bloody sputum.  You have a fever and your symptoms suddenly get worse.  You have  trouble swallowing. MAKE SURE YOU:   Understand these instructions.  Will watch your condition.  Will get help right away if you are not doing well or get worse.   This information is not intended to replace advice given to you by your health care provider. Make sure you discuss any questions you have with your health care provider.   Document Released: 05/13/2006 Document Revised: 01/31/2013 Document Reviewed: 08/03/2012 Elsevier Interactive Patient Education 2016 [employer]  Chronic Obstructive Pulmonary Disease Exacerbation Chronic obstructive pulmonary disease (COPD) is a common lung condition in which airflow from the lungs is limited. COPD is a general term that can be used to describe many different lung problems that limit airflow, including chronic bronchitis and emphysema. COPD exacerbations are episodes when breathing symptoms become much worse and require extra treatment. Without treatment, COPD exacerbations can be life threatening, and frequent COPD exacerbations can cause further damage to your lungs. CAUSES  Respiratory infections.  Exposure to smoke.  Exposure to air pollution, chemical fumes, or dust. Sometimes there is no apparent cause or trigger. RISK FACTORS  Smoking cigarettes.  Older age.  Frequent prior COPD exacerbations. SIGNS AND SYMPTOMS  Increased coughing.  Increased thick spit (sputum) production.  Increased wheezing.  Increased shortness of breath.  Rapid breathing.  Chest tightness. DIAGNOSIS Your medical history, a physical exam, and tests will help your health care provider make a diagnosis. Tests may include:  A chest X-ray.  Basic lab tests.  Sputum testing.  An arterial blood gas test. TREATMENT Depending on the severity of your COPD exacerbation, you may need to be admitted to a hospital for treatment. Some of the treatments commonly used to treat COPD exacerbations are:   Antibiotic medicines.  Bronchodilators.  These are drugs that expand the air passages. They may be given with an inhaler or nebulizer. Spacer devices may be needed to help improve drug delivery.  Corticosteroid medicines.  Supplemental oxygen therapy.  Airway clearing techniques, such as noninvasive ventilation (NIV) and positive expiratory pressure (PEP). These provide respiratory support through a mask or other noninvasive device. HOME CARE INSTRUCTIONS  Do not smoke. Quitting smoking is very important to prevent COPD from getting worse and exacerbations from happening as often.  Avoid exposure to all substances that irritate the airway, especially to tobacco smoke.  If you were prescribed an antibiotic medicine, finish it all even if you start to feel better.  Take all medicines as directed by your health care provider.It is important to use correct technique with inhaled medicines.  Drink enough fluids to keep your urine clear or pale yellow (unless you have a medical condition that requires fluid restriction).  Use a cool mist vaporizer. This makes it easier to clear your chest when you cough.  If you have a home nebulizer and oxygen, continue to use them as directed.  Maintain all necessary vaccinations to prevent infections.  Exercise regularly.  Eat a healthy diet.  Keep all follow-up appointments as directed by your health care provider. SEEK IMMEDIATE MEDICAL CARE IF:  You have worsening shortness of breath.  You have trouble talking.  You have severe chest pain.  You have blood in your sputum.  You  have a fever.  You have weakness, vomit repeatedly, or faint.  You feel confused.  You continue to get worse. MAKE SURE YOU:  Understand these instructions.  Will watch your condition.  Will get help right away if you are not doing well or get worse.   This information is not intended to replace advice given to you by your health care provider. Make sure you discuss any questions you have with  your health care provider.   Document Released: 11/23/2006 Document Revised: 02/16/2014 Document Reviewed: 09/30/2012 Elsevier Interactive Patient Education [employer].

## 2015-06-15 NOTE — ED Notes (Signed)
Bed: WA06 Expected date:  Expected time:  Means of arrival:  Comments: EMS- COPD, SOB

## 2015-06-15 NOTE — ED Provider Notes (Signed)
CSN: [id number]     Arrival date & time 06/15/15  1624 History   First MD Initiated Contact with Patient 06/15/15 1709     Chief Complaint  Patient presents with  . Shortness of Breath     (Consider location/radiation/quality/duration/timing/severity/associated sxs/prior Treatment) HPI   69yF with depression. She reports an episode this morning where her daughter says she was "ranting and raving." Pt reports she cannot remember this. She is concerned that she is increasingly a burden on her family because of her medical problems. Passive SI. No plan. No hallucinations.   Past Medical History  Diagnosis Date  . Atrial fibrillation ([place])   . Hypertension   . Insomnia   . Nonischemic cardiomyopathy ([place])   . Hyperlipidemia   . Anxiety   . Cholelithiasis   . Insomnia   . Mitral regurgitation     noted 2010  . H/O epistaxis   . Rhinitis, allergic   . CHF (congestive heart failure) ([place])   . Pneumonia     "several times w/exacerbations of the COPD; nothing in the last year" (07/15/2012)  . COPD (chronic obstructive pulmonary disease) ([place])     as of 7/13 on 2-3L, pfts 10/2008 with mod obstruction  . Chronic bronchitis with COPD (chronic obstructive pulmonary disease) ([place])   . Shortness of breath     "all the time right now" (07/15/2012)  . GERD (gastroesophageal reflux disease)   . JMEQASTM(196.2)     "weekly" (07/15/2012)  . Migraines     "weekly for awhile; cleared up as I got older" (07/15/2012)  . DJD (degenerative joint disease)   . Arthritis     "all over" (07/15/2012)  . OCD (obsessive compulsive disorder)   . OCD (obsessive compulsive disorder)   . Depression     h/o SI; "last time I was really serious about it was ~ 1997" (07/15/2012)  . Thoracic vertebral fracture ([place]) 11/03/2014  . Primary lung cancer [facility])    Past Surgical History  Procedure Laterality Date  . Tubal ligation  1972  . Cardioversion  2003; 07/2003  . Video bronchoscopy Bilateral 02/27/2015    Procedure:  VIDEO BRONCHOSCOPY WITH FLUORO;  Surgeon: Chesley Mires, MD;  Location: [facility];  Service: Cardiopulmonary;  Laterality: Bilateral;   Family History  Problem Relation Age of Onset  . Asthma    . Emphysema    . Allergies    . Cancer      aunt had several types of cancer  . COPD Mother   . Emphysema Mother   . Cirrhosis Father    Social History  Substance Use Topics  . Smoking status: Former Smoker -- 1.00 packs/day for 35 years    Types: Cigarettes    Quit date: 06/18/2014  . Smokeless tobacco: Never Used  . Alcohol Use: No   OB History    No data available     Review of Systems  All systems reviewed and negative, other than as noted in HPI.   Allergies  Ace inhibitors; Codeine; Pseudoeph-doxylamine-dm-apap; Diphenhydramine hcl; Erythromycin; Nsaids; Nyquil; Tramadol hcl; Fosamax; Tomato; Orange fruit; and Pseudoephedrine  Home Medications   Prior to Admission medications   Medication Sig Start Date End Date Taking? Authorizing Provider  aspirin 325 MG EC tablet Take 650 mg by mouth daily with breakfast.     Historical Provider, MD  budesonide-formoterol (SYMBICORT) 160-4.5 MCG/ACT inhaler Inhale 2 puffs into the lungs 2 (two) times daily.    Historical Provider, MD  busPIRone (BUSPAR) 10  MG tablet Take 10 mg by mouth 3 (three) times daily. 04/22/14   Historical Provider, MD  ciprofloxacin (CIPRO) 750 MG tablet Take 1 tablet (750 mg total) by mouth 2 (two) times daily. Patient not taking: Reported on 06/11/2015 05/22/15   Melvenia Needles, NP  diazepam (VALIUM) 2 MG tablet 1 by mouth every 6 hours as needed 05/02/15   Gerlene Fee, NP  diltiazem (CARDIZEM) 120 MG tablet Take 1 tablet (120 mg total) by mouth every 8 (eight) hours. 03/12/15   Rigoberto Noel, MD  doxycycline (VIBRAMYCIN) 100 MG capsule Take 1 capsule (100 mg total) by mouth 2 (two) times daily. Patient not taking: Reported on 06/12/2015 06/01/15   Tanna Furry, MD  FLUoxetine (PROZAC) 20 MG capsule Take 3 capsules  by mouth once daily    Historical Provider, MD  furosemide (LASIX) 20 MG tablet Take 20 mg by mouth daily.     Historical Provider, MD  guaiFENesin (MUCINEX) 600 MG 12 hr tablet Take 600 mg by mouth 2 (two) times daily as needed for cough.    Historical Provider, MD  HYDROcodone-acetaminophen (NORCO) 10-325 MG tablet Take 1 tablet by mouth every 6 (six) hours as needed for moderate pain.  06/04/15   Historical Provider, MD  HYDROcodone-acetaminophen (NORCO/VICODIN) 5-325 MG tablet Take 1 tablet by mouth every 4 (four) hours as needed. 06/01/15   Tanna Furry, MD  HYDROcodone-homatropine [facility]) 5-1.5 MG/5ML syrup Take 2.5 mLs by mouth 2 (two) times daily as needed for cough. 05/02/15   Gerlene Fee, NP  ipratropium-albuterol (DUONEB) 0.5-2.5 (3) MG/3ML SOLN Take 3 mLs by nebulization 3 (three) times daily. 03/07/15   Tammy S Parrett, NP  levofloxacin (LEVAQUIN) 500 MG tablet Take 1 tablet (500 mg total) by mouth daily. Patient not taking: Reported on 06/12/2015 06/06/15   Rigoberto Noel, MD  metoprolol succinate (TOPROL-XL) 25 MG 24 hr tablet Take 1 tablet (25 mg total) by mouth daily. Patient taking differently: Take 12.5 mg by mouth daily.  02/14/15   Brittainy Erie Noe, PA-C  OXYGEN Inhale into the lungs. 2-3L/min    Historical Provider, MD  pantoprazole (PROTONIX) 40 MG tablet TAKE ONE TABLET BY MOUTH TWICE DAILY 01/03/14   Dellia Nims, MD  potassium chloride SA (KLOR-CON M20) 20 MEQ tablet Take 10 mEq by mouth 2 (two) times daily.     Historical Provider, MD  predniSONE (STERAPRED UNI-PAK 21 TAB) 10 MG (21) TBPK tablet Take 1 tablet (10 mg total) by mouth daily. Take 6 tabs by mouth daily  for 2 days, then 5 tabs for 2 days, then 4 tabs for 2 days, then 3 tabs for 2 days, 2 tabs for 2 days, then 1 tab by mouth daily for 2 days 06/12/15   Isla Pence, MD  SPIRIVA HANDIHALER 18 MCG inhalation capsule PLACE ONE CAPSULE INTO INHALER AND INHALE DAILY 03/07/15   Rigoberto Noel, MD  VENTOLIN HFA 108 (90  Base) MCG/ACT inhaler INHALE ONE TO TWO PUFFS BY MOUTH EVERY 6 HOURS AS NEEDED FOR WHEEZING 03/07/15   Rigoberto Noel, MD   BP 107/77 mmHg  Pulse 132  Temp(Src) 98.4 F (36.9 C) (Oral)  Resp 18  SpO2 98% Physical Exam  Constitutional: She appears well-developed and well-nourished. No distress.  HENT:  Head: Normocephalic and atraumatic.  Eyes: Conjunctivae are normal. Right eye exhibits no discharge. Left eye exhibits no discharge.  Neck: Neck supple.  Cardiovascular: Regular rhythm and normal heart sounds.  Exam reveals no gallop  and no friction rub.   No murmur heard. Tachycardia. irreg irreg.   Pulmonary/Chest: Effort normal. No respiratory distress. She has wheezes.  Abdominal: Soft. She exhibits no distension. There is no tenderness.  Musculoskeletal: She exhibits no tenderness.  Neurological: She is alert.  Skin: Skin is warm and dry.  Psychiatric: She has a normal mood and affect. Her behavior is normal. Thought content normal.  Nursing note and vitals reviewed.   ED Course  Procedures (including critical care time) Labs Review Labs Reviewed - No data to display  Imaging Review Dg Chest 2 View  06/15/2015  CLINICAL DATA:  Patient with shortness of breath for multiple days. EXAM: CHEST  2 VIEW COMPARISON:  Chest radiograph 06/12/2015. FINDINGS: Stable enlarged cardiac and mediastinal contours. Tortuosity of the thoracic aorta. Improved aeration of the left upper lobe. Mild right apical heterogeneous opacities. Thoracic spine degenerative changes. Anterior height loss of multiple mid thoracic vertebral bodies. No pleural effusion or pneumothorax. IMPRESSION: Improved aeration of the left upper lobe. Unchanged interstitial opacities right upper lobe. Cardiomegaly. Electronically Signed   By: Lovey Newcomer M.D.   On: 06/15/2015 18:31   I have personally reviewed and evaluated these images and lab results as part of my medical decision-making.   EKG Interpretation   Date/Time:   Saturday Jun 15 2015 18:59:32 EDT Ventricular Rate:  128 PR Interval:    QRS Duration: 93 QT Interval:  361 QTC Calculation: 527 R Axis:   0 Text Interpretation:  Atrial fibrillation Ventricular premature complex  Low voltage, extremity and precordial leads Minimal ST depression,  anterolateral leads Confirmed by Wilson Singer  MD, Natsha Guidry ([id number]) on 06/15/2015  9:25:18 PM Also confirmed by Wilson Singer  MD, Jaima Janney ([id number]), editor [place] CT,  Leda Gauze [phone])  on 06/16/2015 7:43:29 AM      MDM   Final diagnoses:  COPD exacerbation [facility])  Depression  69yF with concerns about her mental health. Hx of depression which is exacerbated by her underlying medical illnesses. She denies SI or HI. She would like to speak with TTS.     Virgel Manifold, MD 06/22/15 [phone]

## 2015-06-15 NOTE — BH Assessment (Signed)
Contacted patients daughter Levada Dy at [phone] who states that patient "appearantly she didn't take her nebulizer treatment" and I've been having difficulty getting her to take her medications lately.  Patients daughter states that the patient requested that she call [phone]. Patients daughter reports that she "has these extreme periods of being energized and then she will have periods where she won't eat so I'm concerned." Patients daughter reports that the patient "is fighting me tooth and nail" and when asked for clarification she states "she doesn't want to take her medications and refuses to eat and I can't force her to do anything, I can't force her to do anything because I don't have her medical power of attorney." Patients daughter states "she gets really confused and she gets hypoxic and she's on oxygen and she gets confused, she couldn't remember what she wanted to pack and it took about thirty minutes, and then when we got everything packed she screamed "I asked you to take me to the hospital forty minutes ago." Patients daughter states "she pulls her canula off in her sleep and she has her oxygen mask off and she has hypoxia and she's confused and getting her to put it back on is an act of congress."   Rosalin Hawking, LCSW Therapeutic Triage Specialist [place] 06/15/2015 8:05 PM

## 2015-06-15 NOTE — BH Assessment (Signed)
Consulted with Darlyne Russian, PA-C who states that patient does not meet inpatient criteria. He recommends CSW consult. EDP informed and is in agreement. Patient provided with outpatient resources to a psychiatrist or therapist if she would be interested. Patient reports that she feels safe and is comfortable going to her PCP. Patient reports that she does not have an oxygen tank if she is discharged. EDP informed.   Rosalin Hawking, LCSW Therapeutic Triage Specialist [place] 06/15/2015 9:27 PM

## 2015-06-15 NOTE — ED Notes (Signed)
Pt BIB EMS. Pt c/o difficulty breathing over the last few days. She has a hx of COPD, lung CA, and is O2 dependent. She has used her inhaler with no relief. She denies fevers, chest pain. She is alert and in no acute distress. She was given Albuterol nebs x 2 per EMS. She is anxious.

## 2015-06-15 NOTE — ED Notes (Signed)
Patient denies pain and is resting comfortably.  
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
## 2015-06-17 ENCOUNTER — Telehealth: Payer: Self-pay | Admitting: *Deleted

## 2015-06-17 ENCOUNTER — Telehealth: Payer: Self-pay | Admitting: Acute Care

## 2015-06-17 ENCOUNTER — Ambulatory Visit (INDEPENDENT_AMBULATORY_CARE_PROVIDER_SITE_OTHER): Payer: Medicare Other | Admitting: Acute Care

## 2015-06-17 ENCOUNTER — Encounter: Payer: Self-pay | Admitting: Acute Care

## 2015-06-17 VITALS — BP 130/64 | HR 88

## 2015-06-17 DIAGNOSIS — J441 Chronic obstructive pulmonary disease with (acute) exacerbation: Secondary | ICD-10-CM | POA: Diagnosis not present

## 2015-06-17 LAB — CULTURE, BLOOD (ROUTINE X 2): Culture: NO GROWTH

## 2015-06-17 MED ORDER — HYDROCODONE-HOMATROPINE 5-1.5 MG/5ML PO SYRP: 5.0000 mL | ORAL_SOLUTION | Freq: Four times a day (QID) | ORAL | Status: DC | PRN

## 2015-06-17 NOTE — Telephone Encounter (Signed)
Received message from [place] at [place] stating that patient is getting controlled substance medication from different providers.  [place] back and requested Control substance information.  Pharmacy states that patient had Hydrocodone filled on 06/04/2015 from Dr. Rip Harbour; 06/04/15 from Dr. Rip Harbour 30; 06/04/15 Hydrocodone 158m from Dr. ERip Harbour received prescription today from SEric Form NP for Hycodan Syrup, did not refill because too early.   FYI to SJudson Roch

## 2015-06-17 NOTE — Assessment & Plan Note (Addendum)
COPD Flare requiring ED visit Plan: Keep your oxygen at 2 L. Continue using your Symbicort 2 puffs twice daily Remember to rinse your mouth with water after each use. Continue Spiriva once daily Continue your Duonebs three times daily. Complete your prednisone taper as ordered by the ED doctor. Complete your Levaquin as ordered by Dr. Rip Harbour. Add Claritin ( generic) daily for your allergies. Mucinex twice daily as needed for cough and congestion. Hycodan cough syrup ( 120 cc's only) 5 cc's no more than every 6 hours for cough. We discussed very careful use of this medication, and also no driving if sleepy. Follow up appointment with [facility] June 12th as is scheduled. ( CXR before if has not been done by PCP) Please contact office for sooner follow up if symptoms do not improve or worsen or seek emergency care

## 2015-06-17 NOTE — Patient Instructions (Addendum)
It is nice to meet you today. I am glad you are feeling better today. Keep your oxygen at 2 L. Continue using your Symbicort 2 puffs twice daily Remember to rinse your mouth with water after each use. Continue Spiriva once daily Continue your Duonebs three times daily. Complete your prednisone taper as ordered by the ED doctor. Complete your Levaquin as ordered by Dr. Rip Harbour. Add Claritin ( generic) daily for your allergies. Follow up appointment with [facility] June 12th as is scheduled. ( CXR before if has not been done by PCP) Please contact office for sooner follow up if symptoms do not improve or worsen or seek emergency care

## 2015-06-17 NOTE — Progress Notes (Signed)
Subjective:    Patient ID: Kelsey Garcia, female    DOB: 20-Jun-1945, 70 y.o.   MRN: [id number]  HPI  81/ F, smoker presents for FU of COPD & lung cancer  Recent dx of NSCLC (Squamous cell) s/t XRT Last tx 04/15/15 .  She has a h/o opiate abuse in the 21's.  06/17/2015   Hospital Follow Up: Pt. Presents to the office for follow up of an ER visit. Per the ED notes they suspect that she has been using her oxygen at higher than 2 L / min. And that her CO2  May have been elevated( last ABG is 06/12/2015 CO2 is 67.2 mm HG/no ABG checked during recent ED visit). She was discharged on a prednisone taper.She had been started on Levaquin by Guy Franco her PCP last week  and still has 3 days left.She will need a follow up CXR in 1 month if her PCP has not  followed up by time of next appointment .Coughing up whitish secretions now.She denies fever, chest pain, orthopnea, hemoptysis, calf or leg pain. She is very tearful at intervals. She appears to be overwhelmed at times with her cancer diagnosis.   Significant events/procedures:  06/15/2015: ED Visit for COPD exacerbation  CXR 06/15/2015  IMPRESSION: Improved aeration of the left upper lobe. Unchanged interstitial opacities right upper lobe. Cardiomegaly.   Current outpatient prescriptions:  .  aspirin 325 MG EC tablet, Take 650 mg by mouth daily with breakfast. , Disp: , Rfl:  .  budesonide-formoterol (SYMBICORT) 160-4.5 MCG/ACT inhaler, Inhale 2 puffs into the lungs 2 (two) times daily., Disp: , Rfl:  .  busPIRone (BUSPAR) 10 MG tablet, Take 10 mg by mouth 3 (three) times daily., Disp: , Rfl:  .  diazepam (VALIUM) 2 MG tablet, 1 by mouth every 6 hours as needed, Disp: 30 tablet, Rfl: 0 .  diltiazem (CARDIZEM) 120 MG tablet, Take 1 tablet (120 mg total) by mouth every 8 (eight) hours., Disp: 90 tablet, Rfl: 0 .  FLUoxetine (PROZAC) 20 MG capsule, Take 3 capsules by mouth once daily, Disp: , Rfl:  .  furosemide (LASIX) 20 MG tablet, Take 20  mg by mouth daily. , Disp: , Rfl:  .  guaiFENesin (MUCINEX) 600 MG 12 hr tablet, Take 600 mg by mouth 2 (two) times daily as needed for cough., Disp: , Rfl:  .  HYDROcodone-acetaminophen (NORCO) 10-325 MG tablet, Take 1 tablet by mouth every 6 (six) hours as needed for moderate pain. , Disp: , Rfl:  .  HYDROcodone-acetaminophen (NORCO/VICODIN) 5-325 MG tablet, Take 1 tablet by mouth every 4 (four) hours as needed., Disp: 10 tablet, Rfl: 0 .  ipratropium-albuterol (DUONEB) 0.5-2.5 (3) MG/3ML SOLN, Take 3 mLs by nebulization 3 (three) times daily., Disp: 360 mL, Rfl: 3 .  metoprolol succinate (TOPROL-XL) 25 MG 24 hr tablet, Take 1 tablet (25 mg total) by mouth daily. (Patient taking differently: Take 12.5 mg by mouth daily. ), Disp: 90 tablet, Rfl: 3 .  OXYGEN, Inhale into the lungs. 2-3L/min, Disp: , Rfl:  .  pantoprazole (PROTONIX) 40 MG tablet, TAKE ONE TABLET BY MOUTH TWICE DAILY, Disp: 60 tablet, Rfl: 5 .  potassium chloride SA (KLOR-CON M20) 20 MEQ tablet, Take 10 mEq by mouth 2 (two) times daily. , Disp: , Rfl:  .  predniSONE (STERAPRED UNI-PAK 21 TAB) 10 MG (21) TBPK tablet, Take 1 tablet (10 mg total) by mouth daily. Take 6 tabs by mouth daily  for 2 days, then 5 tabs  for 2 days, then 4 tabs for 2 days, then 3 tabs for 2 days, 2 tabs for 2 days, then 1 tab by mouth daily for 2 days, Disp: 42 tablet, Rfl: 0 .  SPIRIVA HANDIHALER 18 MCG inhalation capsule, PLACE ONE CAPSULE INTO INHALER AND INHALE DAILY, Disp: 90 capsule, Rfl: 0 .  VENTOLIN HFA 108 (90 Base) MCG/ACT inhaler, INHALE ONE TO TWO PUFFS BY MOUTH EVERY 6 HOURS AS NEEDED FOR WHEEZING, Disp: 18 each, Rfl: 0 .  HYDROcodone-homatropine (HYCODAN) 5-1.5 MG/5ML syrup, Take 5 mLs by mouth every 6 (six) hours as needed for cough., Disp: 120 mL, Rfl: 0  Review of Systems Constitutional:   No  weight loss, night sweats,  Fevers, chills, fatigue, or  lassitude.  HEENT:   No headaches,  Difficulty swallowing,  Tooth/dental problems, or  Sore  throat,                No sneezing, itching, ear ache, nasal congestion, post nasal drip,   CV:  No chest pain,  Orthopnea, PND, swelling in lower extremities, anasarca, dizziness, palpitations, syncope.   GI  No heartburn, indigestion, abdominal pain, nausea, vomiting, diarrhea, change in bowel habits, loss of appetite, bloody stools.   Resp: + shortness of breath with exertion or at rest.  + excess mucus, + productive cough,  + non-productive cough,  No coughing up of blood.  No change in color of mucus.  + wheezing.  No chest wall deformity  Skin: no rash or lesions.  GU: no dysuria, change in color of urine, no urgency or frequency.  No flank pain, no hematuria   MS:  No joint pain or swelling.  No decreased range of motion.  No back pain.  Psych:  No change in mood or affect. No depression + anxiety.  No memory loss.        Objective:   Physical Exam BP 130/64 mmHg  Pulse 88  SpO2 91%  Physical Exam:  General- No distress,  A&Ox3, in wheelchair ENT: No sinus tenderness, TM clear, pale nasal mucosa, no oral exudate,no post nasal drip, no LAN Cardiac: S1, S2, regular rate and rhythm, no murmur Chest: + wheeze/ no rales/ dullness; no accessory muscle use, no nasal flaring, no sternal retractions Abd.: Soft Non-tender Ext: No clubbing cyanosis, edema Neuro:  normal strength Skin: No rashes, warm and dry Psych: anxious Magdalen Spatz, AGACNP-BC [facility] 06/17/2015    Assessment & Plan:

## 2015-06-17 NOTE — Telephone Encounter (Signed)
Spoke with SG. Pt can't have this filled for cough syrup. I called and spoke with the pharmacist at [facility]. She will call the pt and tell her this. Nothing further was needed.

## 2015-06-17 NOTE — Telephone Encounter (Signed)
Call received from Amy RN with Hospice.  "Calling to speak with Dr. Worthy Flank nurse.  Would like to know if he will be the attending for this patient."     This nurse called Dr. Julien Nordmann with this request.  Verbal order received and read back from Dr. Julien Nordmann, he will NOT be the attending hospice provider for this patient.  Patient is not in need of hospice for lung cancer.   Order given to Amy at this time.

## 2015-06-19 NOTE — Progress Notes (Signed)
Her left upper lobe which was atelectatic on CAT scan on 4/22 appears better aerated now She is not a candidate for more radiotherapy-discussed with Dr. Lanell Persons Dr. Julien Nordmann is evaluating her performance status for chemotherapy

## 2015-06-20 ENCOUNTER — Telehealth: Payer: Self-pay | Admitting: *Deleted

## 2015-06-20 ENCOUNTER — Other Ambulatory Visit: Payer: Self-pay | Admitting: *Deleted

## 2015-06-20 NOTE — Patient Outreach (Signed)
[facility]) Care Management  06/20/2015  Kelsey Garcia [birth year] [id number]   Assessment: Care coordination call Unable to receive a call back from patient as what she had requested on our last conversation.  Attempted to call [facility] (Kelsey S.) yesterday but unable to reach her. Message left on her voicemail with a call back request and name and contact information provided. Follow-up call to [name] made today and was able to speak to Kelsey Garcia Occupational psychologist). She verified that patient is not in Hospice service at this time.  Call placed and spoke with patient stating she had a "rough time" in the past week. She reports that she has "seen the doctor" and was told that she can still have radiation if need be but no chemotherapy and no surgery.   Patient had mentioned about arranging a home health nurse but unable to recall name of home health agency. Unable to schedule a routine home visit with patient since conversation was cut and call was ended early possibly from  "drop call". Attempted to call patient back 3 times but automated answer came on- saying the number is unavailable and care management coordinator is unable to leave a message.  Noted on provider's note dated 5/8 that patient is not a candidate for more radiotherapy and Dr. Julien Nordmann (oncologist) is evaluating patient's performance status for chemotherapy.   Addendum: Call placed and spoke with patient's [name]) who reports that patient has no minutes left in her phone and will purchase phone card for patient to use. Daughter was asked if a home visit schedule can be arranged for patient through her phone but daughter states that she is driving at the moment and is not aware of patient's scheduled appointments at present. Daughter states to "call back later about noon".  Plan: Will call back patient/ daughter at a later time today to attempt to set-up a routine home  visit with patient.     Call placed and spoke with patient's daughter briefly and she states that patient agreed to be seen for routine home visit tomorrow at 9:30 am. Care management coordinator thanked patient's daughter for facilitating arrangement of home visit.   Plan: Routine home visit on 06/21/15. Will provide patient's request for a blood pressure device (as approved by G. Pulliam).  [name] A. Antoin Dargis, BSN, RN-BC [place] Management Coordinator Cell: [phone]

## 2015-06-20 NOTE — Telephone Encounter (Signed)
Oncology Nurse Navigator Documentation  Oncology Nurse Navigator Flowsheets 06/20/2015  Navigator Encounter Type Telephone  Treatment Phase Pre-Tx/Tx Discussion  Barriers/Navigation Needs No Needs  Acuity Level 1  Time Spent with Patient 15   I called patient to follow up.  I read she has decided on Hospice care.  I spoke with patient and asked how she was doing.  I listened as she explained.  I asked that she call me if needed.

## 2015-06-21 ENCOUNTER — Other Ambulatory Visit: Payer: Self-pay | Admitting: *Deleted

## 2015-06-21 ENCOUNTER — Encounter: Payer: Self-pay | Admitting: *Deleted

## 2015-06-21 NOTE — Patient Outreach (Addendum)
[facility]) Care Management   06/21/2015  LAURRIE Garcia [birth year] [id number]  Kelsey Garcia is an 70 y.o. female  Subjective: Patient reports "doing the best I can". She states "85% feeling good and 15% hanging in there, wanting to scream and just psychotic". Patient states had recent hypoxic episodes where she is unable to recall what currently happened. She was advised to maintain oxygen to no more than 2 liters per minute. She reports chronic pain to shoulder (more on the right), back and joints- taking Norco as needed. She is being referred to Pain Clinic by primary care provider for pain management. In the meantime, primary provider is prescribing her pain medication.  Objective: BP 100/58 mmHg  Pulse 85  Resp 15  SpO2 95%    Review of Systems  Constitutional: Positive for weight loss.       History of weight loss related to cancer of the lungs  HENT: Negative.  Negative for hearing loss.   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 Negative for wheezing.        Very minimal cough Small amount of clear white mucus production Respirations even, short of breath with walking and exertion  Cardiovascular: Negative for chest pain and leg swelling.       Regular rate and rhythm  Gastrointestinal: Positive for heartburn. Negative for abdominal pain, diarrhea and constipation.       Obese Abdomen soft, non tender Bowel sounds present  Genitourinary: Positive for frequency.  Musculoskeletal: Positive for back pain and joint pain. Negative for falls.       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       Tremors/ involuntary shaking to both hands  Endo/Heme/Allergies:  Bruises/bleeds easily.  Psychiatric/Behavioral: Positive for depression and memory loss. The patient is nervous/anxious.        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 aspirin 325 MG EC tablet Take 650 mg by mouth daily with breakfast.    . budesonide-formoterol (SYMBICORT) 160-4.5 MCG/ACT inhaler Inhale 2 puffs into the lungs 2 (two) times daily.   . busPIRone (BUSPAR) 10 MG tablet Take 10 mg by mouth 3 (three) times daily. 08/29/2014: .  . diazepam (VALIUM) 2 MG tablet 1 by mouth every 6 hours as needed   . diltiazem (CARDIZEM) 120 MG tablet Take 1 tablet (120 mg total) by mouth every 8 (eight) hours.   Marland Kitchen FLUoxetine (PROZAC) 20 MG capsule Take 3 capsules by mouth once daily   . furosemide (LASIX) 20 MG tablet Take 20 mg by mouth daily.  06/21/2015: Taking 40 mg daily as ordered, taking additional dose if noted swelling as stated  . HYDROcodone-acetaminophen (NORCO) 10-325 MG tablet Take 1 tablet by mouth every 6 (six) hours as needed for moderate pain.    Marland Kitchen HYDROcodone-homatropine (HYCODAN) 5-1.5 MG/5ML syrup Take 5 mLs by mouth every 6 (six) hours as needed for cough.   Marland Kitchen ipratropium-albuterol (DUONEB) 0.5-2.5 (3) MG/3ML SOLN Take 3 mLs by nebulization  3 (three) times daily.   . metoprolol succinate (TOPROL-XL) 25 MG 24 hr tablet Take 1 tablet (25 mg total) by mouth daily. (Patient taking differently: Take 12.5 mg by mouth daily. ) 06/21/2015: Taking 1 tablet 25 mg daily  . OXYGEN Inhale into the lungs. 2-3L/min 06/21/2015: Set at 2 liters   . pantoprazole (PROTONIX) 40 MG tablet TAKE ONE TABLET BY MOUTH TWICE DAILY   . potassium chloride SA (KLOR-CON M20) 20 MEQ tablet Take 10 mEq by mouth 2 (two) times daily.    . predniSONE (STERAPRED UNI-PAK 21 TAB) 10 MG (21) TBPK tablet Take 1 tablet (10 mg total) by  mouth daily. Take 6 tabs by mouth daily  for 2 days, then 5 tabs for 2 days, then 4 tabs for 2 days, then 3 tabs for 2 days, 2 tabs for 2 days, then 1 tab by mouth daily for 2 days   . SPIRIVA HANDIHALER 18 MCG inhalation capsule PLACE ONE CAPSULE INTO INHALER AND INHALE DAILY   . VENTOLIN HFA 108 (90 Base) MCG/ACT inhaler INHALE ONE TO TWO PUFFS BY MOUTH EVERY 6 HOURS AS NEEDED FOR WHEEZING   . guaiFENesin (MUCINEX) 600 MG 12 hr tablet Take 600 mg by mouth 2 (two) times daily as needed for cough. Reported on 06/21/2015   . HYDROcodone-acetaminophen (NORCO/VICODIN) 5-325 MG tablet Take 1 tablet by mouth every 4 (four) hours as needed. (Patient not taking: Reported on 06/21/2015)    No facility-administered encounter medications on file as of 06/21/2015.    Functional Status:   In your present state of health, do you have any difficulty performing the following activities: 06/21/2015 06/15/2015  Hearing? N N  Vision? N N  Difficulty concentrating or making decisions? N N  Walking or climbing stairs? Y Y  Dressing or bathing? N N  Doing errands, shopping? Y -  Conservation officer, nature and eating ? Y -  Using the Toilet? N -  In the past six months, have you accidently leaked urine? N -  Do you have problems with loss of bowel control? N -  Managing your Medications? N -  Managing your Finances? Y -  Housekeeping or managing your Housekeeping? Y -    Fall/Depression Screening:    PHQ 2/9 Scores 06/21/2015 05/17/2015 05/07/2015 03/15/2015 07/12/2014 07/02/2014 05/09/2014  PHQ - 2 Score 1 2 0 6 0 5 4  PHQ- 9 Score - 8 - 19 - 13 16    Assessment:   70 year old female being seen primarily for COPD. Arrived at patient's house. Patient's daughter and grandchild are in the house and directed care management coordinator to patient's room on the 2nd floor. Patient seemed calm today with no signs of anxiety.  Ms. Vejar reports completing Levaquin antibiotics today but continues to take tapered dose of  Prednisone. Medications reviewed. Patient continues to manage her own medications using "pill box". Patient noted with minimal cough and states having clear white mucus occasionally. She identifies herself being on the "green zone" with shortness of breath no more than usual. She is aware to call provider when moving to the yellow zone and calling 911 when in the red zone.  She verbalized being able to monitor weight and records on [place] except when having hypoxic episodes which makes her unable to remember as stated.  Weight today is 164 pounds. Blood pressure was 100/58 taken using new blood pressure apparatus provided to her since the old device was destroyed by their cat. Educated patient on  proper use and care of device. Encouraged her to record results on [place] and bring readings to provider for review. Reinforced education on keeping oxygen at 2 liters per minute to prevent increased carbon dioxide level; continue Symbicort use and rinse mouth with water after each use to avoid oral thrush; continue breathing treatments Spiriva and Duoneb as ordered; adherence to new medication orders and careful use of Hycodan cough syrup.  Patient continues to be knowledgeable of COPD zones, zone tool and COPD action plan by stating to take rest periods at intervals to catch her breath, use rescue inhaler when needed, call the doctor for help when developing signs/ symptoms of COPD flare-up and if condition does not improve to call 911 or go the emergency room.  Ms. Askari mentioned that it was explained to her that Hospice care was too early for her and she was not ready for the trade off of being with hospice. She has [facility] contact information for future needs. During the visit, patient talked to Ms. Larene Pickett (from [employer] therapy) who was willing to provide Psych therapy that patient feels she was needing. Patient also contacted primary provider's office to get referral to  [place] particularly physical therapy to work on upper and lower body strengthening. Patient had shown awareness of resources to contact when needed. She had declined once again, a referral to [place] work for assistance with Advanced Directives and other community resources as well as referral to [place] for further disease management. Patient's daughter was made aware of such.   Patient was seen for follow-up by primary care provider [place], [place]) yesterday 5/11 and had scheduled a follow-up visit on 5/16 as reported. Transportation is provided by her daughter (with a newly bought car), otherwise, she uses [facility] or Medicaid transport if needed.  Patient denies being able to identify any additional needs at present. She agreed to close case with preferred goals met and to notify primary care provider of such. Patient did not feel the need to be transitioned to [place] coach when offered. She was encouraged to call [facility] management coordinator or 24-hour nurse line if future needs arise. Patient has contact information with her.   Plan: Will close case as goals had been met.  Will notify primary care provider of case closure.    THN CM Care Plan Problem One        Most Recent Value   Care Plan Problem One  at risk of readmission   Role Documenting the Problem One  Care Management [place] for Problem One  Not Active   [facility] Long Term Goal (31-90 days)  patient will not be readmitted in the next 31 days   THN Long Term Goal Start Date  05/15/15   [facility] Long Term Goal Met Date  06/21/15 [Goal met- had ED visits on 4/22,5/3, 5/6 but no admission]   [facility] CM Short Term Goal #1 (0-30 days)  patient will weigh daily and record in the next 30 days   [facility] CM Short Term Goal #1 Start Date  05/15/15   [facility] CM Short Term Goal #1 Met Date  06/21/15 [Met- mostly done except on hypoxic episodes per patient]   [facility] CM Short Term Goal #2 (0-30 days)  patient will attend  scheduled follow-up appointments with provider (Pulmonologist, Cardiologist, PCP) within the next month   [facility] CM Short Term Goal #2 Start Date  05/15/15 Lequita Halt by PCP  on 4/3, pulmonology 4/12,  cardiologist 4/18]   [facility] CM Short Term Goal #2 Met Date  06/21/15 [Goal met]     [name] A. Marin Wisner, BSN, RN-BC [place] Management Coordinator Cell: [phone]

## 2015-06-22 ENCOUNTER — Encounter: Payer: Self-pay | Admitting: *Deleted

## 2015-06-30 ENCOUNTER — Emergency Department (HOSPITAL_COMMUNITY)
Admission: EM | Admit: 2015-06-30 | Discharge: 2015-07-01 | Disposition: A | Discharge status: Home / Self Care | Payer: Medicare Other | Attending: Emergency Medicine | Admitting: Emergency Medicine

## 2015-06-30 ENCOUNTER — Encounter (HOSPITAL_COMMUNITY): Payer: Self-pay

## 2015-06-30 ENCOUNTER — Emergency Department (HOSPITAL_COMMUNITY): Payer: Medicare Other

## 2015-06-30 DIAGNOSIS — F329 Major depressive disorder, single episode, unspecified: Secondary | ICD-10-CM | POA: Insufficient documentation

## 2015-06-30 DIAGNOSIS — R0602 Shortness of breath: Secondary | ICD-10-CM | POA: Diagnosis present

## 2015-06-30 DIAGNOSIS — I110 Hypertensive heart disease with heart failure: Secondary | ICD-10-CM | POA: Insufficient documentation

## 2015-06-30 DIAGNOSIS — I509 Heart failure, unspecified: Secondary | ICD-10-CM | POA: Insufficient documentation

## 2015-06-30 DIAGNOSIS — Z87891 Personal history of nicotine dependence: Secondary | ICD-10-CM | POA: Diagnosis not present

## 2015-06-30 DIAGNOSIS — I499 Cardiac arrhythmia, unspecified: Secondary | ICD-10-CM | POA: Insufficient documentation

## 2015-06-30 DIAGNOSIS — M1990 Unspecified osteoarthritis, unspecified site: Secondary | ICD-10-CM | POA: Diagnosis not present

## 2015-06-30 DIAGNOSIS — J441 Chronic obstructive pulmonary disease with (acute) exacerbation: Secondary | ICD-10-CM | POA: Insufficient documentation

## 2015-06-30 DIAGNOSIS — Z7982 Long term (current) use of aspirin: Secondary | ICD-10-CM | POA: Insufficient documentation

## 2015-06-30 LAB — BLOOD GAS, ARTERIAL
ACID-BASE EXCESS: 8.7 mmol/L — AB (ref 0.0–2.0)
Bicarbonate: 34.1 mEq/L — ABNORMAL HIGH (ref 20.0–24.0)
Drawn by: 235321
O2 CONTENT: 2 L/min
O2 Saturation: 86.6 %
PATIENT TEMPERATURE: 98.6
PCO2 ART: 54.9 mmHg — AB (ref 35.0–45.0)
PH ART: 7.41 (ref 7.350–7.450)
PO2 ART: 52.9 mmHg — AB (ref 80.0–100.0)
TCO2: 32.1 mmol/L (ref 0–100)

## 2015-06-30 LAB — COMPREHENSIVE METABOLIC PANEL
ALK PHOS: 77 U/L (ref 38–126)
ALT: 11 U/L — AB (ref 14–54)
AST: 16 U/L (ref 15–41)
Albumin: 3.2 g/dL — ABNORMAL LOW (ref 3.5–5.0)
Anion gap: 6 (ref 5–15)
BILIRUBIN TOTAL: 0.9 mg/dL (ref 0.3–1.2)
BUN: 14 mg/dL (ref 6–20)
CALCIUM: 8.7 mg/dL — AB (ref 8.9–10.3)
CHLORIDE: 97 mmol/L — AB (ref 101–111)
CO2: 34 mmol/L — ABNORMAL HIGH (ref 22–32)
CREATININE: 0.66 mg/dL (ref 0.44–1.00)
Glucose, Bld: 126 mg/dL — ABNORMAL HIGH (ref 65–99)
Potassium: 3.9 mmol/L (ref 3.5–5.1)
Sodium: 137 mmol/L (ref 135–145)
Total Protein: 6.5 g/dL (ref 6.5–8.1)

## 2015-06-30 LAB — CBC
HEMATOCRIT: 31.5 % — AB (ref 36.0–46.0)
HEMOGLOBIN: 9 g/dL — AB (ref 12.0–15.0)
MCH: 25.4 pg — AB (ref 26.0–34.0)
MCHC: 28.6 g/dL — ABNORMAL LOW (ref 30.0–36.0)
MCV: 89 fL (ref 78.0–100.0)
Platelets: 191 10*3/uL (ref 150–400)
RBC: 3.54 MIL/uL — AB (ref 3.87–5.11)
RDW: 16.1 % — ABNORMAL HIGH (ref 11.5–15.5)
WBC: 7.3 10*3/uL (ref 4.0–10.5)

## 2015-06-30 LAB — TROPONIN I: Troponin I: <0.03 ng/mL (ref <0.031)

## 2015-06-30 MED ORDER — IPRATROPIUM-ALBUTEROL 0.5-2.5 (3) MG/3ML IN SOLN
RESPIRATORY_TRACT | Status: AC
  Filled 2015-06-30: qty 3

## 2015-06-30 MED ORDER — IPRATROPIUM-ALBUTEROL 0.5-2.5 (3) MG/3ML IN SOLN
3.0000 mL | Freq: Once | RESPIRATORY_TRACT | Status: AC
  Administered 2015-06-30: 3 mL via RESPIRATORY_TRACT

## 2015-06-30 MED ORDER — IPRATROPIUM-ALBUTEROL 0.5-2.5 (3) MG/3ML IN SOLN: 3.0000 mL | Freq: Four times a day (QID) | RESPIRATORY_TRACT | Status: DC

## 2015-06-30 MED ORDER — SODIUM CHLORIDE 0.9 % IV SOLN
INTRAVENOUS | Status: DC
  Administered 2015-06-30: 23:00:00 via INTRAVENOUS

## 2015-06-30 NOTE — ED Notes (Signed)
Per EMS- pt CO SOB for about a day. Rhonci and expiratory wheeze. Home 02 2L. 2 duonebs and 125 solu medrol given with improvement.

## 2015-06-30 NOTE — ED Notes (Signed)
Bed: WA23 Expected date:  Expected time:  Means of arrival:  Comments: EMS 

## 2015-06-30 NOTE — ED Provider Notes (Signed)
CSN: [id number]     Arrival date & time 06/30/15  2235 History   First MD Initiated Contact with Patient 06/30/15 2237     Chief Complaint  Patient presents with  . Shortness of Breath  PT HERE WITH SOB.  PT CALLED EMS DUE TO SOB.  SHE IS SUPPOSED TO BE ON 2L [place] ALL THE TIME, SHE WAS NOT ON IT WHEN EMS ARRIVED.  THE PT IS ALSO SUPPOSED TO DO AT LEAST 4 NEBS DAILY, BUT ONLY DID 1 TODAY.  THE PT WAS GIVEN A NEB EN ROUTE AND 125 MG OF SOLUMEDROL.  PT'S BREATHING HAS IMPROVED.  WHEN ASKED WHY PT DOES NOT ALWAYS WEAR HER O2, SHE IS UNSURE.   (Consider location/radiation/quality/duration/timing/severity/associated sxs/prior Treatment) Patient is a 70 y.o. female presenting with shortness of breath. The history is provided by the patient.  Shortness of Breath Severity:  Moderate Onset quality:  Gradual Timing:  Constant Progression:  Worsening Chronicity:  Recurrent Worsened by:  Nothing tried Associated symptoms: cough     Past Medical History  Diagnosis Date  . Atrial fibrillation ([place])   . Hypertension   . Insomnia   . Nonischemic cardiomyopathy ([place])   . Hyperlipidemia   . Anxiety   . Cholelithiasis   . Insomnia   . Mitral regurgitation     noted 2010  . H/O epistaxis   . Rhinitis, allergic   . CHF (congestive heart failure) ([place])   . Pneumonia     "several times w/exacerbations of the COPD; nothing in the last year" (07/15/2012)  . COPD (chronic obstructive pulmonary disease) ([place])     as of 7/13 on 2-3L, pfts 10/2008 with mod obstruction  . Chronic bronchitis with COPD (chronic obstructive pulmonary disease) ([place])   . Shortness of breath     "all the time right now" (07/15/2012)  . GERD (gastroesophageal reflux disease)   . GYIRSWNI(627.0)     "weekly" (07/15/2012)  . Migraines     "weekly for awhile; cleared up as I got older" (07/15/2012)  . DJD (degenerative joint disease)   . Arthritis     "all over" (07/15/2012)  . OCD (obsessive compulsive disorder)   . OCD (obsessive  compulsive disorder)   . Depression     h/o SI; "last time I was really serious about it was ~ 1997" (07/15/2012)  . Thoracic vertebral fracture ([place]) 11/03/2014  . Primary lung cancer [facility])    Past Surgical History  Procedure Laterality Date  . Tubal ligation  1972  . Cardioversion  2003; 07/2003  . Video bronchoscopy Bilateral 02/27/2015    Procedure: VIDEO BRONCHOSCOPY WITH FLUORO;  Surgeon: Chesley Mires, MD;  Location: [facility];  Service: Cardiopulmonary;  Laterality: Bilateral;   Family History  Problem Relation Age of Onset  . Asthma    . Emphysema    . Allergies    . Cancer      aunt had several types of cancer  . COPD Mother   . Emphysema Mother   . Cirrhosis Father    Social History  Substance Use Topics  . Smoking status: Former Smoker -- 1.00 packs/day for 35 years    Types: Cigarettes    Quit date: 06/18/2014  . Smokeless tobacco: Never Used  . Alcohol Use: No   OB History    No data available     Review of Systems  Respiratory: Positive for cough and shortness of breath.   All other systems reviewed and are negative.  Allergies  Ace inhibitors; Codeine; Pseudoeph-doxylamine-dm-apap; Diphenhydramine hcl; Erythromycin; Nsaids; Nyquil; Tramadol hcl; Fosamax; Tomato; Orange fruit; and Pseudoephedrine  Home Medications   Prior to Admission medications   Medication Sig Start Date End Date Taking? Authorizing Provider  aspirin 325 MG EC tablet Take 650 mg by mouth daily with breakfast.    Yes Historical Provider, MD  budesonide-formoterol (SYMBICORT) 160-4.5 MCG/ACT inhaler Inhale 2 puffs into the lungs 2 (two) times daily.   Yes Historical Provider, MD  busPIRone (BUSPAR) 10 MG tablet Take 10 mg by mouth 3 (three) times daily. 04/22/14  Yes Historical Provider, MD  diazepam (VALIUM) 2 MG tablet 1 by mouth every 6 hours as needed Patient taking differently: Take 2 mg by mouth every 6 (six) hours as needed for anxiety.  05/02/15  Yes Gerlene Fee, NP   diltiazem (CARDIZEM) 120 MG tablet Take 1 tablet (120 mg total) by mouth every 8 (eight) hours. 03/12/15  Yes Rigoberto Noel, MD  FLUoxetine (PROZAC) 20 MG capsule Take 3 capsules by mouth once daily   Yes Historical Provider, MD  furosemide (LASIX) 20 MG tablet Take 20 mg by mouth daily.    Yes Historical Provider, MD  HYDROcodone-acetaminophen (NORCO) 10-325 MG tablet Take 1 tablet by mouth every 6 (six) hours as needed for moderate pain.  06/04/15  Yes Historical Provider, MD  HYDROcodone-homatropine (HYCODAN) 5-1.5 MG/5ML syrup Take 5 mLs by mouth every 6 (six) hours as needed for cough. 06/17/15  Yes Magdalen Spatz, NP  ipratropium-albuterol (DUONEB) 0.5-2.5 (3) MG/3ML SOLN Take 3 mLs by nebulization 3 (three) times daily. 03/07/15  Yes Tammy S Parrett, NP  metoprolol succinate (TOPROL-XL) 25 MG 24 hr tablet Take 1 tablet (25 mg total) by mouth daily. 02/14/15  Yes Brittainy Erie Noe, PA-C  OXYGEN Inhale into the lungs. 2-3L/min   Yes Historical Provider, MD  pantoprazole (PROTONIX) 40 MG tablet TAKE ONE TABLET BY MOUTH TWICE DAILY 01/03/14  Yes Tasrif Ahmed, MD  potassium chloride SA (KLOR-CON M20) 20 MEQ tablet Take 10 mEq by mouth 2 (two) times daily.    Yes Historical Provider, MD  SPIRIVA HANDIHALER 18 MCG inhalation capsule PLACE ONE CAPSULE INTO INHALER AND INHALE DAILY 03/07/15  Yes Rigoberto Noel, MD  VENTOLIN HFA 108 (90 Base) MCG/ACT inhaler INHALE ONE TO TWO PUFFS BY MOUTH EVERY 6 HOURS AS NEEDED FOR WHEEZING 03/07/15  Yes Rigoberto Noel, MD  HYDROcodone-acetaminophen (NORCO/VICODIN) 5-325 MG tablet Take 1 tablet by mouth every 4 (four) hours as needed. Patient not taking: Reported on 06/21/2015 06/01/15   Tanna Furry, MD  predniSONE (STERAPRED UNI-PAK 21 TAB) 10 MG (21) TBPK tablet Take 1 tablet (10 mg total) by mouth daily. Take 6 tabs by mouth daily  for 2 days, then 5 tabs for 2 days, then 4 tabs for 2 days, then 3 tabs for 2 days, 2 tabs for 2 days, then 1 tab by mouth daily for 2 days 07/01/15    Isla Pence, MD   BP 144/98 mmHg  Pulse 115  Temp(Src) 99.4 F (37.4 C) (Oral)  Resp 22  SpO2 92% Physical Exam  Constitutional: She is oriented to person, place, and time. She appears well-developed and well-nourished.  HENT:  Head: Normocephalic and atraumatic.  Right Ear: External ear normal.  Left Ear: External ear normal.  Nose: Nose normal.  Mouth/Throat: Oropharynx is clear and moist.  Eyes: Conjunctivae and EOM are normal. Pupils are equal, round, and reactive to light.  Neck: Normal range of  motion. Neck supple.  Cardiovascular: An irregularly irregular rhythm present.  Pulmonary/Chest: She has wheezes.  Abdominal: Soft. Bowel sounds are normal.  Musculoskeletal: Normal range of motion.  Neurological: She is alert and oriented to person, place, and time.  Skin: Skin is warm and dry.  Psychiatric: She has a normal mood and affect. Her behavior is normal. Judgment and thought content normal.  Nursing note and vitals reviewed.   ED Course  Procedures (including critical care time) Labs Review Labs Reviewed  CBC - Abnormal; Notable for the following:    RBC 3.54 (*)    Hemoglobin 9.0 (*)    HCT 31.5 (*)    MCH 25.4 (*)    MCHC 28.6 (*)    RDW 16.1 (*)    All other components within normal limits  COMPREHENSIVE METABOLIC PANEL - Abnormal; Notable for the following:    Chloride 97 (*)    CO2 34 (*)    Glucose, Bld 126 (*)    Calcium 8.7 (*)    Albumin 3.2 (*)    ALT 11 (*)    All other components within normal limits  BLOOD GAS, ARTERIAL - Abnormal; Notable for the following:    pCO2 arterial 54.9 (*)    pO2, Arterial 52.9 (*)    Bicarbonate 34.1 (*)    Acid-Base Excess 8.7 (*)    All other components within normal limits  TROPONIN I    Imaging Review Dg Chest 2 View  07/01/2015  CLINICAL DATA:  Shortness of breath for 1 day.  Wheezing. EXAM: CHEST  2 VIEW COMPARISON:  Most recent chest radiograph 06/15/2015. Chest CT 06/01/2015 FINDINGS:  Cardiomegaly is unchanged. Increased vascular congestion. There is peribronchial thickening. The right upper lobe interstitial opacities are stable from recent priors, better defined on CT. No new airspace disease or confluent lobar consolidation. No pleural effusion or pneumothorax. IMPRESSION: 1. Stable cardiomegaly. Increased vascular congestion from prior. Peribronchial cuffing may be secondary to pulmonary edema or bronchial thickening. 2. Unchanged interstitial opacities in the right upper lung, better defined on prior CT. Electronically Signed   By: Jeb Levering M.D.   On: 07/01/2015 00:35   I have personally reviewed and evaluated these images and lab results as part of my medical decision-making.   EKG Interpretation None      MDM  Pt's breathing is back to baseline when she keeps oxygen on.  Pt keeps taking it off and I told her to keep it on.  Pt to return if worse. Final diagnoses:  Acute exacerbation of chronic obstructive pulmonary disease (COPD) ([place])        Isla Pence, MD 07/01/15 1742

## 2015-07-01 DIAGNOSIS — J441 Chronic obstructive pulmonary disease with (acute) exacerbation: Secondary | ICD-10-CM | POA: Diagnosis not present

## 2015-07-01 MED ORDER — PREDNISONE 10 MG (21) PO TBPK: 10.0000 mg | ORAL_TABLET | Freq: Every day | ORAL | Status: DC

## 2015-07-01 NOTE — Progress Notes (Signed)
Patient having intermittent confusion. Unable to follow commands enough to obtain a peak flow, despite many attempts. Adult wheeze protocol completed due to patient not meeting criteria (She has a diagnosis of COPD). MD made aware.

## 2015-07-01 NOTE — ED Notes (Signed)
Per pt. daughter over phone pt has not taken any of her meds today.

## 2015-07-03 ENCOUNTER — Telehealth: Payer: Self-pay | Admitting: *Deleted

## 2015-07-12 ENCOUNTER — Encounter (HOSPITAL_COMMUNITY)
Admission: RE | Admit: 2015-07-12 | Discharge: 2015-07-12 | Disposition: A | Discharge status: Home / Self Care | Payer: Medicare Other | Source: Ambulatory Visit | Attending: Internal Medicine | Admitting: Internal Medicine

## 2015-07-12 ENCOUNTER — Other Ambulatory Visit (HOSPITAL_BASED_OUTPATIENT_CLINIC_OR_DEPARTMENT_OTHER): Payer: Medicare Other

## 2015-07-12 ENCOUNTER — Other Ambulatory Visit: Payer: Self-pay | Admitting: Internal Medicine

## 2015-07-12 DIAGNOSIS — C3492 Malignant neoplasm of unspecified part of left bronchus or lung: Secondary | ICD-10-CM

## 2015-07-12 DIAGNOSIS — C3412 Malignant neoplasm of upper lobe, left bronchus or lung: Secondary | ICD-10-CM | POA: Diagnosis not present

## 2015-07-12 LAB — CBC WITH DIFFERENTIAL/PLATELET
BASO%: 0.5 % (ref 0.0–2.0)
BASOS ABS: 0 10*3/uL (ref 0.0–0.1)
EOS%: 2.1 % (ref 0.0–7.0)
Eosinophils Absolute: 0.1 10*3/uL (ref 0.0–0.5)
HEMATOCRIT: 30.6 % — AB (ref 34.8–46.6)
HGB: 9 g/dL — ABNORMAL LOW (ref 11.6–15.9)
LYMPH%: 12.8 % — AB (ref 14.0–49.7)
MCH: 25.1 pg (ref 25.1–34.0)
MCHC: 29.5 g/dL — AB (ref 31.5–36.0)
MCV: 85 fL (ref 79.5–101.0)
MONO#: 0.6 10*3/uL (ref 0.1–0.9)
MONO%: 9.8 % (ref 0.0–14.0)
NEUT#: 4.9 10*3/uL (ref 1.5–6.5)
NEUT%: 74.8 % (ref 38.4–76.8)
PLATELETS: 221 10*3/uL (ref 145–400)
RBC: 3.6 10*6/uL — AB (ref 3.70–5.45)
RDW: 17.7 % — ABNORMAL HIGH (ref 11.2–14.5)
WBC: 6.5 10*3/uL (ref 3.9–10.3)
lymph#: 0.8 10*3/uL — ABNORMAL LOW (ref 0.9–3.3)

## 2015-07-12 LAB — COMPREHENSIVE METABOLIC PANEL
ANION GAP: 7 meq/L (ref 3–11)
AST: <7 U/L (ref 5–34)
Albumin: 2.9 g/dL — ABNORMAL LOW (ref 3.5–5.0)
Alkaline Phosphatase: 81 U/L (ref 40–150)
BUN: 18.3 mg/dL (ref 7.0–26.0)
CALCIUM: 8.5 mg/dL (ref 8.4–10.4)
CHLORIDE: 101 meq/L (ref 98–109)
CO2: 36 meq/L — AB (ref 22–29)
CREATININE: 0.8 mg/dL (ref 0.6–1.1)
EGFR: 74 mL/min/{1.73_m2} — AB (ref >90)
Glucose: 107 mg/dl (ref 70–140)
POTASSIUM: 4.1 meq/L (ref 3.5–5.1)
Sodium: 144 mEq/L (ref 136–145)
Total Bilirubin: 0.36 mg/dL (ref 0.20–1.20)
Total Protein: 5.4 g/dL — ABNORMAL LOW (ref 6.4–8.3)

## 2015-07-12 LAB — GLUCOSE, CAPILLARY: GLUCOSE-CAPILLARY: 109 mg/dL — AB (ref 65–99)

## 2015-07-12 MED ORDER — FLUDEOXYGLUCOSE F - 18 (FDG) INJECTION
8.0900 | Freq: Once | INTRAVENOUS | Status: AC | PRN
  Administered 2015-07-12: 8.09 via INTRAVENOUS

## 2015-07-12 MED ORDER — FLUDEOXYGLUCOSE F - 18 (FDG) INJECTION: 88.0900 | Freq: Once | INTRAVENOUS | Status: DC | PRN

## 2015-07-15 ENCOUNTER — Emergency Department (HOSPITAL_COMMUNITY): Payer: Medicare Other

## 2015-07-15 ENCOUNTER — Ambulatory Visit: Payer: Medicare Other | Admitting: Internal Medicine

## 2015-07-15 ENCOUNTER — Inpatient Hospital Stay (HOSPITAL_COMMUNITY)
Admission: EM | Admit: 2015-07-15 | Discharge: 2015-07-18 | DRG: 190 | Disposition: A | Discharge status: Home Health | Payer: Medicare Other | Attending: Internal Medicine | Admitting: Internal Medicine

## 2015-07-15 ENCOUNTER — Other Ambulatory Visit: Payer: Self-pay

## 2015-07-15 ENCOUNTER — Encounter (HOSPITAL_COMMUNITY): Payer: Self-pay | Admitting: Emergency Medicine

## 2015-07-15 DIAGNOSIS — Z9981 Dependence on supplemental oxygen: Secondary | ICD-10-CM

## 2015-07-15 DIAGNOSIS — F418 Other specified anxiety disorders: Secondary | ICD-10-CM | POA: Diagnosis present

## 2015-07-15 DIAGNOSIS — Z923 Personal history of irradiation: Secondary | ICD-10-CM

## 2015-07-15 DIAGNOSIS — Z886 Allergy status to analgesic agent status: Secondary | ICD-10-CM

## 2015-07-15 DIAGNOSIS — C3492 Malignant neoplasm of unspecified part of left bronchus or lung: Secondary | ICD-10-CM | POA: Diagnosis present

## 2015-07-15 DIAGNOSIS — J449 Chronic obstructive pulmonary disease, unspecified: Secondary | ICD-10-CM | POA: Diagnosis present

## 2015-07-15 DIAGNOSIS — Z7952 Long term (current) use of systemic steroids: Secondary | ICD-10-CM

## 2015-07-15 DIAGNOSIS — I480 Paroxysmal atrial fibrillation: Secondary | ICD-10-CM | POA: Diagnosis present

## 2015-07-15 DIAGNOSIS — Z7951 Long term (current) use of inhaled steroids: Secondary | ICD-10-CM

## 2015-07-15 DIAGNOSIS — I4891 Unspecified atrial fibrillation: Secondary | ICD-10-CM | POA: Diagnosis not present

## 2015-07-15 DIAGNOSIS — Z79899 Other long term (current) drug therapy: Secondary | ICD-10-CM

## 2015-07-15 DIAGNOSIS — Z885 Allergy status to narcotic agent status: Secondary | ICD-10-CM

## 2015-07-15 DIAGNOSIS — Z91018 Allergy to other foods: Secondary | ICD-10-CM

## 2015-07-15 DIAGNOSIS — F329 Major depressive disorder, single episode, unspecified: Secondary | ICD-10-CM | POA: Diagnosis present

## 2015-07-15 DIAGNOSIS — Z66 Do not resuscitate: Secondary | ICD-10-CM | POA: Diagnosis present

## 2015-07-15 DIAGNOSIS — J9621 Acute and chronic respiratory failure with hypoxia: Secondary | ICD-10-CM | POA: Diagnosis present

## 2015-07-15 DIAGNOSIS — Z825 Family history of asthma and other chronic lower respiratory diseases: Secondary | ICD-10-CM

## 2015-07-15 DIAGNOSIS — E785 Hyperlipidemia, unspecified: Secondary | ICD-10-CM | POA: Diagnosis present

## 2015-07-15 DIAGNOSIS — I429 Cardiomyopathy, unspecified: Secondary | ICD-10-CM | POA: Diagnosis present

## 2015-07-15 DIAGNOSIS — J189 Pneumonia, unspecified organism: Secondary | ICD-10-CM | POA: Diagnosis present

## 2015-07-15 DIAGNOSIS — J441 Chronic obstructive pulmonary disease with (acute) exacerbation: Principal | ICD-10-CM | POA: Diagnosis present

## 2015-07-15 DIAGNOSIS — J440 Chronic obstructive pulmonary disease with acute lower respiratory infection: Secondary | ICD-10-CM | POA: Diagnosis present

## 2015-07-15 DIAGNOSIS — F429 Obsessive-compulsive disorder, unspecified: Secondary | ICD-10-CM | POA: Diagnosis present

## 2015-07-15 DIAGNOSIS — K219 Gastro-esophageal reflux disease without esophagitis: Secondary | ICD-10-CM | POA: Diagnosis present

## 2015-07-15 DIAGNOSIS — F419 Anxiety disorder, unspecified: Secondary | ICD-10-CM | POA: Diagnosis present

## 2015-07-15 DIAGNOSIS — D638 Anemia in other chronic diseases classified elsewhere: Secondary | ICD-10-CM | POA: Diagnosis present

## 2015-07-15 DIAGNOSIS — K8020 Calculus of gallbladder without cholecystitis without obstruction: Secondary | ICD-10-CM | POA: Diagnosis present

## 2015-07-15 DIAGNOSIS — Z888 Allergy status to other drugs, medicaments and biological substances status: Secondary | ICD-10-CM

## 2015-07-15 DIAGNOSIS — J309 Allergic rhinitis, unspecified: Secondary | ICD-10-CM | POA: Diagnosis present

## 2015-07-15 DIAGNOSIS — G8929 Other chronic pain: Secondary | ICD-10-CM | POA: Diagnosis present

## 2015-07-15 DIAGNOSIS — E876 Hypokalemia: Secondary | ICD-10-CM | POA: Diagnosis present

## 2015-07-15 DIAGNOSIS — Z881 Allergy status to other antibiotic agents status: Secondary | ICD-10-CM

## 2015-07-15 DIAGNOSIS — Z7982 Long term (current) use of aspirin: Secondary | ICD-10-CM

## 2015-07-15 DIAGNOSIS — I340 Nonrheumatic mitral (valve) insufficiency: Secondary | ICD-10-CM | POA: Diagnosis present

## 2015-07-15 DIAGNOSIS — I5032 Chronic diastolic (congestive) heart failure: Secondary | ICD-10-CM | POA: Diagnosis present

## 2015-07-15 DIAGNOSIS — C3490 Malignant neoplasm of unspecified part of unspecified bronchus or lung: Secondary | ICD-10-CM | POA: Diagnosis present

## 2015-07-15 DIAGNOSIS — I110 Hypertensive heart disease with heart failure: Secondary | ICD-10-CM | POA: Diagnosis present

## 2015-07-15 DIAGNOSIS — C3412 Malignant neoplasm of upper lobe, left bronchus or lung: Secondary | ICD-10-CM | POA: Diagnosis not present

## 2015-07-15 LAB — CBC WITH DIFFERENTIAL/PLATELET
BASOS ABS: 0 10*3/uL (ref 0.0–0.1)
BASOS PCT: 0 %
Eosinophils Absolute: 0 10*3/uL (ref 0.0–0.7)
Eosinophils Relative: 0 %
HEMATOCRIT: 32.8 % — AB (ref 36.0–46.0)
Hemoglobin: 9.4 g/dL — ABNORMAL LOW (ref 12.0–15.0)
Lymphocytes Relative: 5 %
Lymphs Abs: 0.5 10*3/uL — ABNORMAL LOW (ref 0.7–4.0)
MCH: 25 pg — ABNORMAL LOW (ref 26.0–34.0)
MCHC: 28.7 g/dL — ABNORMAL LOW (ref 30.0–36.0)
MCV: 87.2 fL (ref 78.0–100.0)
MONO ABS: 1 10*3/uL (ref 0.1–1.0)
Monocytes Relative: 9 %
NEUTROS ABS: 9.3 10*3/uL — AB (ref 1.7–7.7)
NEUTROS PCT: 86 %
PLATELETS: 194 10*3/uL (ref 150–400)
RBC: 3.76 MIL/uL — ABNORMAL LOW (ref 3.87–5.11)
RDW: 16.9 % — AB (ref 11.5–15.5)
WBC: 10.8 10*3/uL — ABNORMAL HIGH (ref 4.0–10.5)

## 2015-07-15 LAB — COMPREHENSIVE METABOLIC PANEL
ALBUMIN: 3.4 g/dL — AB (ref 3.5–5.0)
ALT: 11 U/L — ABNORMAL LOW (ref 14–54)
AST: 14 U/L — AB (ref 15–41)
Alkaline Phosphatase: 86 U/L (ref 38–126)
Anion gap: 9 (ref 5–15)
BILIRUBIN TOTAL: 1 mg/dL (ref 0.3–1.2)
BUN: 10 mg/dL (ref 6–20)
CHLORIDE: 96 mmol/L — AB (ref 101–111)
CO2: 36 mmol/L — ABNORMAL HIGH (ref 22–32)
Calcium: 8.7 mg/dL — ABNORMAL LOW (ref 8.9–10.3)
Creatinine, Ser: 0.61 mg/dL (ref 0.44–1.00)
GFR calc Af Amer: >60 mL/min (ref >60)
GFR calc non Af Amer: >60 mL/min (ref >60)
Glucose, Bld: 165 mg/dL — ABNORMAL HIGH (ref 65–99)
POTASSIUM: 3.3 mmol/L — AB (ref 3.5–5.1)
SODIUM: 141 mmol/L (ref 135–145)
TOTAL PROTEIN: 6.7 g/dL (ref 6.5–8.1)

## 2015-07-15 LAB — I-STAT TROPONIN, ED: TROPONIN I, POC: 0 ng/mL (ref 0.00–0.08)

## 2015-07-15 LAB — BLOOD GAS, ARTERIAL
ACID-BASE EXCESS: 8.7 mmol/L — AB (ref 0.0–2.0)
BICARBONATE: 34.4 meq/L — AB (ref 20.0–24.0)
Drawn by: 257701
O2 CONTENT: 3 L/min
O2 Saturation: 91 %
PATIENT TEMPERATURE: 98.6
PCO2 ART: 56.5 mmHg — AB (ref 35.0–45.0)
TCO2: 32.2 mmol/L (ref 0–100)
pH, Arterial: 7.402 (ref 7.350–7.450)
pO2, Arterial: 61.7 mmHg — ABNORMAL LOW (ref 80.0–100.0)

## 2015-07-15 LAB — I-STAT CG4 LACTIC ACID, ED: Lactic Acid, Venous: 1.84 mmol/L (ref 0.5–2.0)

## 2015-07-15 MED ORDER — IPRATROPIUM-ALBUTEROL 0.5-2.5 (3) MG/3ML IN SOLN
3.0000 mL | Freq: Four times a day (QID) | RESPIRATORY_TRACT | Status: DC
  Administered 2015-07-16 – 2015-07-18 (×9): 3 mL via RESPIRATORY_TRACT
  Filled 2015-07-15 (×11): qty 3

## 2015-07-15 MED ORDER — ASPIRIN EC 325 MG PO TBEC
650.0000 mg | DELAYED_RELEASE_TABLET | Freq: Every day | ORAL | Status: DC
  Administered 2015-07-16 – 2015-07-18 (×3): 650 mg via ORAL
  Filled 2015-07-15 (×3): qty 2

## 2015-07-15 MED ORDER — IOPAMIDOL (ISOVUE-370) INJECTION 76%
100.0000 mL | Freq: Once | INTRAVENOUS | Status: AC | PRN
  Administered 2015-07-15: 100 mL via INTRAVENOUS

## 2015-07-15 MED ORDER — SODIUM CHLORIDE 0.9 % IV BOLUS (SEPSIS)
1000.0000 mL | Freq: Once | INTRAVENOUS | Status: AC
  Administered 2015-07-15: 1000 mL via INTRAVENOUS

## 2015-07-15 MED ORDER — ENOXAPARIN SODIUM 40 MG/0.4ML ~~LOC~~ SOLN
40.0000 mg | SUBCUTANEOUS | Status: DC
  Administered 2015-07-15 – 2015-07-17 (×3): 40 mg via SUBCUTANEOUS
  Filled 2015-07-15 (×3): qty 0.4

## 2015-07-15 MED ORDER — METHYLPREDNISOLONE SODIUM SUCC 125 MG IJ SOLR
80.0000 mg | Freq: Two times a day (BID) | INTRAMUSCULAR | Status: DC
  Administered 2015-07-15 – 2015-07-16 (×2): 80 mg via INTRAVENOUS
  Filled 2015-07-15 (×2): qty 2

## 2015-07-15 MED ORDER — LEVALBUTEROL HCL 0.63 MG/3ML IN NEBU
0.6300 mg | INHALATION_SOLUTION | Freq: Four times a day (QID) | RESPIRATORY_TRACT | Status: DC | PRN
  Administered 2015-07-16: 0.63 mg via RESPIRATORY_TRACT
  Filled 2015-07-15: qty 3

## 2015-07-15 MED ORDER — HYDROCODONE-ACETAMINOPHEN 5-325 MG PO TABS
1.0000 | ORAL_TABLET | Freq: Once | ORAL | Status: AC
  Administered 2015-07-15: 1 via ORAL
  Filled 2015-07-15: qty 1

## 2015-07-15 MED ORDER — BUSPIRONE HCL 5 MG PO TABS
10.0000 mg | ORAL_TABLET | Freq: Three times a day (TID) | ORAL | Status: DC
  Administered 2015-07-15 – 2015-07-18 (×8): 10 mg via ORAL
  Filled 2015-07-15 (×8): qty 2

## 2015-07-15 MED ORDER — FLUOXETINE HCL 20 MG PO CAPS
40.0000 mg | ORAL_CAPSULE | Freq: Every day | ORAL | Status: DC
  Administered 2015-07-16 – 2015-07-18 (×3): 40 mg via ORAL
  Filled 2015-07-15 (×3): qty 2

## 2015-07-15 MED ORDER — SODIUM CHLORIDE 0.9% FLUSH
3.0000 mL | Freq: Two times a day (BID) | INTRAVENOUS | Status: DC
  Administered 2015-07-15 – 2015-07-18 (×6): 3 mL via INTRAVENOUS

## 2015-07-15 MED ORDER — DILTIAZEM HCL 60 MG PO TABS
120.0000 mg | ORAL_TABLET | Freq: Once | ORAL | Status: AC
  Administered 2015-07-15: 120 mg via ORAL
  Filled 2015-07-15: qty 2

## 2015-07-15 MED ORDER — MOMETASONE FURO-FORMOTEROL FUM 200-5 MCG/ACT IN AERO
2.0000 | INHALATION_SPRAY | Freq: Two times a day (BID) | RESPIRATORY_TRACT | Status: DC
  Administered 2015-07-16 – 2015-07-18 (×5): 2 via RESPIRATORY_TRACT
  Filled 2015-07-15: qty 8.8

## 2015-07-15 MED ORDER — ALBUTEROL (5 MG/ML) CONTINUOUS INHALATION SOLN
10.0000 mg/h | INHALATION_SOLUTION | RESPIRATORY_TRACT | Status: DC
  Administered 2015-07-15: 10 mg/h via RESPIRATORY_TRACT
  Filled 2015-07-15: qty 20

## 2015-07-15 MED ORDER — CETYLPYRIDINIUM CHLORIDE 0.05 % MT LIQD
7.0000 mL | Freq: Two times a day (BID) | OROMUCOSAL | Status: DC
  Administered 2015-07-16 – 2015-07-17 (×4): 7 mL via OROMUCOSAL

## 2015-07-15 MED ORDER — GUAIFENESIN ER 600 MG PO TB12
600.0000 mg | ORAL_TABLET | Freq: Two times a day (BID) | ORAL | Status: DC
  Administered 2015-07-15 – 2015-07-18 (×6): 600 mg via ORAL
  Filled 2015-07-15 (×6): qty 1

## 2015-07-15 MED ORDER — ALBUTEROL SULFATE (2.5 MG/3ML) 0.083% IN NEBU
2.5000 mg | INHALATION_SOLUTION | Freq: Once | RESPIRATORY_TRACT | Status: AC
  Administered 2015-07-15: 2.5 mg via RESPIRATORY_TRACT
  Filled 2015-07-15: qty 3

## 2015-07-15 MED ORDER — METOPROLOL SUCCINATE ER 25 MG PO TB24
25.0000 mg | ORAL_TABLET | Freq: Every day | ORAL | Status: DC
  Administered 2015-07-16 – 2015-07-18 (×3): 25 mg via ORAL
  Filled 2015-07-15 (×3): qty 1

## 2015-07-15 MED ORDER — CHLORHEXIDINE GLUCONATE 0.12 % MT SOLN
15.0000 mL | Freq: Two times a day (BID) | OROMUCOSAL | Status: DC
  Administered 2015-07-15 – 2015-07-18 (×6): 15 mL via OROMUCOSAL
  Filled 2015-07-15 (×7): qty 15

## 2015-07-15 MED ORDER — DIAZEPAM 2 MG PO TABS: 2.0000 mg | ORAL_TABLET | Freq: Four times a day (QID) | ORAL | Status: DC | PRN

## 2015-07-15 MED ORDER — PANTOPRAZOLE SODIUM 40 MG PO TBEC
40.0000 mg | DELAYED_RELEASE_TABLET | Freq: Two times a day (BID) | ORAL | Status: DC
  Administered 2015-07-15 – 2015-07-18 (×6): 40 mg via ORAL
  Filled 2015-07-15 (×6): qty 1

## 2015-07-15 MED ORDER — LEVOFLOXACIN IN D5W 750 MG/150ML IV SOLN
750.0000 mg | Freq: Once | INTRAVENOUS | Status: AC
  Administered 2015-07-15: 750 mg via INTRAVENOUS
  Filled 2015-07-15: qty 150

## 2015-07-15 MED ORDER — HYDROCODONE-ACETAMINOPHEN 10-325 MG PO TABS
1.0000 | ORAL_TABLET | Freq: Four times a day (QID) | ORAL | Status: DC | PRN
  Administered 2015-07-15 – 2015-07-18 (×10): 1 via ORAL
  Filled 2015-07-15 (×10): qty 1

## 2015-07-15 MED ORDER — DILTIAZEM HCL 60 MG PO TABS
120.0000 mg | ORAL_TABLET | Freq: Three times a day (TID) | ORAL | Status: DC
  Administered 2015-07-16 – 2015-07-18 (×7): 120 mg via ORAL
  Filled 2015-07-15 (×8): qty 2

## 2015-07-15 MED ORDER — LEVOFLOXACIN IN D5W 750 MG/150ML IV SOLN
750.0000 mg | INTRAVENOUS | Status: DC
  Administered 2015-07-16: 750 mg via INTRAVENOUS
  Filled 2015-07-15: qty 150

## 2015-07-15 NOTE — ED Notes (Signed)
Bed: JI96 Expected date:  Expected time: 11:44 AM Means of arrival:  Comments: EMS- 69yo F, Lung CA/ SOB

## 2015-07-15 NOTE — ED Provider Notes (Signed)
CSN: [id number]     Arrival date & time 07/15/15  1154 History   First MD Initiated Contact with Patient 07/15/15 1159     Chief Complaint  Patient presents with  . Shortness of Breath     (Consider location/radiation/quality/duration/timing/severity/associated sxs/prior Treatment) HPI  70 year old female who presents with shortness of breath. She has a history of non-small cell lung carcinoma, nonischemic cardiomyopathy with EF of 55%, atrial fibrillation, and COPD on 2 L home oxygen. States onset of severe shortness of breath yesterday evening, and prior to this has been having symptoms of cough and congestion. Has not had fevers or chills, lower extremity edema or pain, orthopnea or PND. Denies associating chest pain, nausea or vomiting, diarrhea, abdominal pain or back pain. Feels that this is similar to when she has had COPD in the past. Had called EMS today, and prior to arrival received 2 albuterol/Atrovent treatments with 125 mg of Solu-Medrol. Initially 80% at home on home oxygen.   Past Medical History  Diagnosis Date  . Atrial fibrillation ([place])   . Hypertension   . Insomnia   . Nonischemic cardiomyopathy ([place])   . Hyperlipidemia   . Anxiety   . Cholelithiasis   . Insomnia   . Mitral regurgitation     noted 2010  . H/O epistaxis   . Rhinitis, allergic   . CHF (congestive heart failure) ([place])   . Pneumonia     "several times w/exacerbations of the COPD; nothing in the last year" (07/15/2012)  . COPD (chronic obstructive pulmonary disease) ([place])     as of 7/13 on 2-3L, pfts 10/2008 with mod obstruction  . Chronic bronchitis with COPD (chronic obstructive pulmonary disease) ([place])   . Shortness of breath     "all the time right now" (07/15/2012)  . GERD (gastroesophageal reflux disease)   . XNTZGYFV(494.4)     "weekly" (07/15/2012)  . Migraines     "weekly for awhile; cleared up as I got older" (07/15/2012)  . DJD (degenerative joint disease)   . Arthritis     "all over"  (07/15/2012)  . OCD (obsessive compulsive disorder)   . OCD (obsessive compulsive disorder)   . Depression     h/o SI; "last time I was really serious about it was ~ 1997" (07/15/2012)  . Thoracic vertebral fracture ([place]) 11/03/2014  . Primary lung cancer [facility])    Past Surgical History  Procedure Laterality Date  . Tubal ligation  1972  . Cardioversion  2003; 07/2003  . Video bronchoscopy Bilateral 02/27/2015    Procedure: VIDEO BRONCHOSCOPY WITH FLUORO;  Surgeon: Chesley Mires, MD;  Location: [facility];  Service: Cardiopulmonary;  Laterality: Bilateral;   Family History  Problem Relation Age of Onset  . Asthma    . Emphysema    . Allergies    . Cancer      aunt had several types of cancer  . COPD Mother   . Emphysema Mother   . Cirrhosis Father    Social History  Substance Use Topics  . Smoking status: Former Smoker -- 1.00 packs/day for 35 years    Types: Cigarettes    Quit date: 06/18/2014  . Smokeless tobacco: Never Used  . Alcohol Use: No   OB History    No data available     Review of Systems Physical Exam  Nursing note and vitals reviewed. Constitutional: Well developed, well nourished, non-toxic, and in no acute distress Head: Normocephalic and atraumatic.  Mouth/Throat: Oropharynx is clear and moist.  Neck: Normal range of motion. Neck supple.  Cardiovascular: Normal rate and regular rhythm.   Pulmonary/Chest: Effort normal and breath sounds normal.  Abdominal: Soft. There is no tenderness. There is no rebound and no guarding.  Musculoskeletal: Normal range of motion.  Neurological: Alert, no facial droop, fluent speech, moves all extremities symmetrically Skin: Skin is warm and dry.  Psychiatric: Cooperative   Allergies  Ace inhibitors; Codeine; Pseudoeph-doxylamine-dm-apap; Diphenhydramine hcl; Erythromycin; Nsaids; Nyquil; Tramadol hcl; Fosamax; Tomato; Orange fruit; and Pseudoephedrine  Home Medications   Prior to Admission medications   Medication  Sig Start Date End Date Taking? Authorizing Provider  aspirin 325 MG EC tablet Take 650 mg by mouth daily with breakfast.    Yes Historical Provider, MD  budesonide-formoterol (SYMBICORT) 160-4.5 MCG/ACT inhaler Inhale 2 puffs into the lungs 2 (two) times daily.   Yes Historical Provider, MD  busPIRone (BUSPAR) 10 MG tablet Take 10 mg by mouth 3 (three) times daily. 04/22/14  Yes Historical Provider, MD  diazepam (VALIUM) 2 MG tablet 1 by mouth every 6 hours as needed Patient taking differently: Take 2 mg by mouth every 6 (six) hours as needed for anxiety.  05/02/15  Yes Gerlene Fee, NP  diltiazem (CARDIZEM) 120 MG tablet Take 1 tablet (120 mg total) by mouth every 8 (eight) hours. 03/12/15  Yes Rigoberto Noel, MD  FLUoxetine (PROZAC) 20 MG capsule Take 3 capsules by mouth once daily   Yes Historical Provider, MD  furosemide (LASIX) 20 MG tablet Take 20 mg by mouth daily.    Yes Historical Provider, MD  HYDROcodone-acetaminophen (NORCO) 10-325 MG tablet Take 1 tablet by mouth every 6 (six) hours as needed for moderate pain.  06/04/15  Yes Historical Provider, MD  HYDROcodone-homatropine (HYCODAN) 5-1.5 MG/5ML syrup Take 5 mLs by mouth every 6 (six) hours as needed for cough. 06/17/15  Yes Magdalen Spatz, NP  ipratropium-albuterol (DUONEB) 0.5-2.5 (3) MG/3ML SOLN Take 3 mLs by nebulization 3 (three) times daily. 03/07/15  Yes Tammy S Parrett, NP  metoprolol succinate (TOPROL-XL) 25 MG 24 hr tablet Take 1 tablet (25 mg total) by mouth daily. 02/14/15  Yes Brittainy Erie Noe, PA-C  OXYGEN Inhale 2 L into the lungs continuous. 2-3L/min   Yes Historical Provider, MD  pantoprazole (PROTONIX) 40 MG tablet TAKE ONE TABLET BY MOUTH TWICE DAILY 01/03/14  Yes Tasrif Ahmed, MD  potassium chloride SA (KLOR-CON M20) 20 MEQ tablet Take 10 mEq by mouth 2 (two) times daily.    Yes Historical Provider, MD  predniSONE (STERAPRED UNI-PAK 21 TAB) 10 MG (21) TBPK tablet Take 1 tablet (10 mg total) by mouth daily. Take 6 tabs by  mouth daily  for 2 days, then 5 tabs for 2 days, then 4 tabs for 2 days, then 3 tabs for 2 days, 2 tabs for 2 days, then 1 tab by mouth daily for 2 days 07/01/15  Yes Isla Pence, MD  SPIRIVA HANDIHALER 18 MCG inhalation capsule PLACE ONE CAPSULE INTO INHALER AND INHALE DAILY 03/07/15  Yes Rigoberto Noel, MD  VENTOLIN HFA 108 (90 Base) MCG/ACT inhaler INHALE ONE TO TWO PUFFS BY MOUTH EVERY 6 HOURS AS NEEDED FOR WHEEZING 03/07/15  Yes Rigoberto Noel, MD  HYDROcodone-acetaminophen (NORCO/VICODIN) 5-325 MG tablet Take 1 tablet by mouth every 4 (four) hours as needed. Patient not taking: Reported on 06/21/2015 06/01/15   Tanna Furry, MD   BP 93/65 mmHg  Pulse 117  Temp(Src) 99.5 F (37.5 C) (Oral)  Resp 26  Ht '5\' 5"'$  (1.651 m)  Wt 163 lb (73.936 kg)  BMI 27.12 kg/m2  SpO2 95% Physical Exam Physical Exam  Nursing note and vitals reviewed. Constitutional: Chronically ill appearing , non-toxic, and in moderate acute distress Head: Normocephalic and atraumatic.  Mouth/Throat: Oropharynx is clear and moist.  Neck: Normal range of motion. Neck supple.  Cardiovascular: tachycardic rate and irregularly irregular rhythm.   Pulmonary/Chest: Tachypnea, mild accessory muscle usage, with rhonchorous breath sounds throughout and expiratory wheezes throughout. Abdominal: Soft. There is no tenderness. There is no rebound and no guarding.  Musculoskeletal: Normal range of motion.  Neurological: Alert, no facial droop, fluent speech, moves all extremities symmetrically Skin: Skin is warm and dry.  Psychiatric: Cooperative  ED Course  Procedures (including critical care time) Labs Review Labs Reviewed  CBC WITH DIFFERENTIAL/PLATELET - Abnormal; Notable for the following:    WBC 10.8 (*)    RBC 3.76 (*)    Hemoglobin 9.4 (*)    HCT 32.8 (*)    MCH 25.0 (*)    MCHC 28.7 (*)    RDW 16.9 (*)    Neutro Abs 9.3 (*)    Lymphs Abs 0.5 (*)    All other components within normal limits  COMPREHENSIVE METABOLIC  PANEL - Abnormal; Notable for the following:    Potassium 3.3 (*)    Chloride 96 (*)    CO2 36 (*)    Glucose, Bld 165 (*)    Calcium 8.7 (*)    Albumin 3.4 (*)    AST 14 (*)    ALT 11 (*)    All other components within normal limits  BLOOD GAS, ARTERIAL - Abnormal; Notable for the following:    pCO2 arterial 56.5 (*)    pO2, Arterial 61.7 (*)    Bicarbonate 34.4 (*)    Acid-Base Excess 8.7 (*)    All other components within normal limits  I-STAT TROPOININ, ED  I-STAT CG4 LACTIC ACID, ED  I-STAT CG4 LACTIC ACID, ED    Imaging Review Dg Chest 2 View  07/15/2015  CLINICAL DATA:  Shortness of breath since 2 a.m. today.  Wheezing. EXAM: CHEST  2 VIEW COMPARISON:  06/30/2015. FINDINGS: Enlarged cardiac silhouette without significant change. The pulmonary vasculature and interstitial markings remain prominent. Interval small amount of ill-defined opacity at the left lung base . No pleural fluid. Diffuse osteopenia. Tortuous aorta. IMPRESSION: 1. Mild left basilar atelectasis or pneumonia. 2. Stable cardiomegaly, pulmonary vascular congestion and interstitial lung disease. Electronically Signed   By: Claudie Revering M.D.   On: 07/15/2015 14:02   Ct Angio Chest Pe W/cm &/or Wo Cm  07/15/2015  CLINICAL DATA:  Per EMS, patient states she has been short of breath since 2am today. Lung ca Pt. C/o shortness of breath, history of non-small cell lung carcinoma dx'd 02/2015, xrt complete, shortness of breath, history of non-small cell lung carcinoma dx'd 02/2015, xrt complete, pt. states onset of severe shortness of breath yesterday evening, cough and congestion, hx of COPD, hypoxia, EXAM: CT ANGIOGRAPHY CHEST WITH CONTRAST TECHNIQUE: Multidetector CT imaging of the chest was performed using the standard protocol during bolus administration of intravenous contrast. Multiplanar CT image reconstructions and MIPs were obtained to evaluate the vascular anatomy. CONTRAST:  100 mL Isovue 370 IV COMPARISON:  PET-CT  07/12/2015 and previous FINDINGS: Vascular: Left arm IV contrast injection. Innominate vein and SVC are patent. Right atrium and right ventricle are nondilated. Satisfactory opacification of pulmonary arteries noted, and there is no evidence of  pulmonary emboli. Breathing motion degrades some of the images. Patent bilateral pulmonary veins drain into the left atrium. Mild left atrial enlargement. Scattered coronary calcifications. Adequate contrast opacification of the thoracic aorta with no evidence of dissection, aneurysm, or stenosis. There is bovine variant brachiocephalic arch anatomy without proximal stenosis. Descending segment is ectatic and atheromatous. Scattered calcifications throughout the aorta. Infrarenal segment not evaluated. Mediastinum/Lymph Nodes: Sub cm precarinal, AP window, and subcarinal lymph nodes. No pericardial effusion. Lungs/Pleura: Patchy airspace opacities peripherally in the right upper lobe, new since PET-CT. There is some atelectasis or infiltrate in the inferior and posterior aspect of the lingula and posteriorly in both lower lobes left greater than right. Motion degrades portions of examination. No pleural effusion. No pneumothorax. Upper abdomen: Cholelithiasis.  No acute abnormality. Musculoskeletal: Come T U/S compression deformities in the mid thoracic spine are stable since prior CT of 06/01/2015. No worrisome lytic or sclerotic bone lesion. Breathing motion degrades the coronal and sagittal reconstructions. Review of the MIP images confirms the above findings. IMPRESSION: 1. Negative for acute PE or thoracic aortic dissection. 2. New patchy airspace opacities in the right upper lobe, lingula, and posterior lung bases. 3. Atherosclerosis, including aortic hand coronary artery disease. Please note that although the presence of coronary artery calcium documents the presence of coronary artery disease, the severity of this disease and any potential stenosis cannot be assessed  on this non-gated CT examination. Assessment for potential risk factor modification, dietary therapy or pharmacologic therapy may be warranted, if clinically indicated. 4. Cholelithiasis. Electronically Signed   By: Lucrezia Europe M.D.   On: 07/15/2015 15:53   I have personally reviewed and evaluated these images and lab results as part of my medical decision-making.   EKG Interpretation None      CRITICAL CARE Performed by: Forde Dandy   Total critical care time: 35 minutes  Critical care time was exclusive of separately billable procedures and treating other patients.  Critical care was necessary to treat or prevent imminent or life-threatening deterioration.  Critical care was time spent personally by me on the following activities: development of treatment plan with patient and/or surrogate as well as nursing, discussions with consultants, evaluation of patient's response to treatment, examination of patient, obtaining history from patient or surrogate, ordering and performing treatments and interventions, ordering and review of laboratory studies, ordering and review of radiographic studies, pulse oximetry and re-evaluation of patient's condition.   MDM   Final diagnoses:  COPD exacerbation ([place])  Atrial fibrillation with RVR ([place])    70 year old female with history of non-squamous cell lung cancer and COPD on 2 L home oxygen who presents with dyspnea. 3 L oxygen requirement here in the ED with normal/baseline oxygenation. She has tachypnea and increased work of breathing and is in atrial fibrillation with RVR. Lungs rhonchorous and with diffuse wheezing. Consistent with COPD exacerbation.   Had received 2 treatments prior to arrival and subsequently placed on continuous albuterol for 1 hour. Received  solumedrol PTA. CT PE without PE, but new air space opacities in RUL. Covered with levaquin. Work of breathing improved but still c/o of dyspnea and still wheezing.  Will plan for  admission for COPD exacerbation.   Forde Dandy, MD 07/15/15 [phone]

## 2015-07-15 NOTE — ED Notes (Signed)
Respiratory notified of arterial blood gas and continuous neb treatment.

## 2015-07-15 NOTE — Progress Notes (Signed)
Patient noted to have had 5 admissions with 8 ED visits within the last six months.  EDCM spoke to patient at bedside.  Patient reports, "I have been working hard with home health so that I won't have to come to the hospital.  We had a big care meeting about it today.  I didn't want to come to the hospital."  Patient confirms her pcp is Dr. Mariane Masters and reports she saw her pcp on Thursday.  Patient reports her daughter and grand daughter live with her.  Patient with pmhx of lung cancer.  Patient reports she wears oxygen at home supplied by [facility].  Patient reports she does not have any difficulty getting to her doctor appointments.  She takes Medicaid transportation, [facility] or her daughter takes her.  She reports her daughter picks up her prescriptions for her.  Patient reports she takes her medications as prescribed.  No further EDCM needs at this time.  Patient is active with [facility].

## 2015-07-15 NOTE — H&P (Addendum)
History and Physical    Kelsey Garcia VVO:[id number] DOB: 24-May-1945 DOA: 07/15/2015  Referring MD/NP/PA: EDP PCP: Darden Amber, PA Outpatient Specialists: Mare Ferrari and Onc Dr.Mohamed  Chief Complaint: Shortness of breath  HPI: Kelsey Garcia is a 70 y.o. female with past medical history of COPD/chronic respiratory failure on 2-3 L home oxygen, stage II B non-small cell lung cancer status post XRT, chronic diastolic CHF, history of paroxysmal atrial fibrillation, depression, anxiety, history of narcotic dependence, chronic pain presents to the ER with the above complaints. Patient reports chronic dyspnea at baseline, which is worse with minimal activity however this significantly worsened last night associated with productive cough, congestion, chest tightness, wheezing, shortness of breath. She also felt feverish but never checked her temperature. She's been using the nebulizer at home more since last night, due to persistence/worsening of symptoms she called EMS, EMS recorded an oxygen saturation of 80% on arrival, subsequently noted to have diffuse wheezing received breathing treatments and then transported to the ER.  ED Course: She had a CT angiogram of the chest done which showed no PE, new patchy airspace opacities in the right upper lobe, lingula and posterior lung bases concerning for pneumonia. She was also noted to be wheezing diffusely and got a continuous albuterol nebulizer treatment. ABG was reassuring, [facility] was consulted for further evaluation and management  Review of Systems:per HPI,  All other systems reviewed and are negative  Past Medical History  Diagnosis Date  . Atrial fibrillation ([place])   . Hypertension   . Insomnia   . Nonischemic cardiomyopathy ([place])   . Hyperlipidemia   . Anxiety   . Cholelithiasis   . Insomnia   . Mitral regurgitation     noted 2010  . H/O epistaxis   . Rhinitis, allergic   . CHF (congestive heart failure) ([place])   . Pneumonia    "several times w/exacerbations of the COPD; nothing in the last year" (07/15/2012)  . COPD (chronic obstructive pulmonary disease) ([place])     as of 7/13 on 2-3L, pfts 10/2008 with mod obstruction  . Chronic bronchitis with COPD (chronic obstructive pulmonary disease) ([place])   . Shortness of breath     "all the time right now" (07/15/2012)  . GERD (gastroesophageal reflux disease)   . YIRSWNIO(270.3)     "weekly" (07/15/2012)  . Migraines     "weekly for awhile; cleared up as I got older" (07/15/2012)  . DJD (degenerative joint disease)   . Arthritis     "all over" (07/15/2012)  . OCD (obsessive compulsive disorder)   . OCD (obsessive compulsive disorder)   . Depression     h/o SI; "last time I was really serious about it was ~ 1997" (07/15/2012)  . Thoracic vertebral fracture ([place]) 11/03/2014  . Primary lung cancer [facility])     Past Surgical History  Procedure Laterality Date  . Tubal ligation  1972  . Cardioversion  2003; 07/2003  . Video bronchoscopy Bilateral 02/27/2015    Procedure: VIDEO BRONCHOSCOPY WITH FLUORO;  Surgeon: Chesley Mires, MD;  Location: [facility];  Service: Cardiopulmonary;  Laterality: Bilateral;     reports that she quit smoking about 12 months ago. Her smoking use included Cigarettes. She has a 35 pack-year smoking history. She has never used smokeless tobacco. She reports that she does not drink alcohol or use illicit drugs.  Allergies  Allergen Reactions  . Ace Inhibitors Cough  . Codeine Swelling    Face and lips swelling  Patient report  Can tolerate hydrocodone  . Pseudoeph-Doxylamine-Dm-Apap Itching  . Diphenhydramine Hcl Other (See Comments)    "feels like my skin is crawling, and legs twitches"  . Erythromycin Diarrhea  . Nsaids Nausea And Vomiting  . Nyquil [Pseudoeph-Doxylamine-Dm-Apap] Itching  . Tramadol Hcl Other (See Comments)    stomach pain, hallucinations  . Fosamax [Alendronate] Other (See Comments)    Nausea and abdominal bloating.   . Tomato  Other (See Comments)    Stomach swells  . Orange Fruit [Citrus] Rash  . Pseudoephedrine Palpitations    Family History  Problem Relation Age of Onset  . Asthma    . Emphysema    . Allergies    . Cancer      aunt had several types of cancer  . COPD Mother   . Emphysema Mother   . Cirrhosis Father     Prior to Admission medications   Medication Sig Start Date End Date Taking? Authorizing Provider  aspirin 325 MG EC tablet Take 650 mg by mouth daily with breakfast.    Yes Historical Provider, MD  budesonide-formoterol (SYMBICORT) 160-4.5 MCG/ACT inhaler Inhale 2 puffs into the lungs 2 (two) times daily.   Yes Historical Provider, MD  busPIRone (BUSPAR) 10 MG tablet Take 10 mg by mouth 3 (three) times daily. 04/22/14  Yes Historical Provider, MD  diazepam (VALIUM) 2 MG tablet 1 by mouth every 6 hours as needed Patient taking differently: Take 2 mg by mouth every 6 (six) hours as needed for anxiety.  05/02/15  Yes Gerlene Fee, NP  diltiazem (CARDIZEM) 120 MG tablet Take 1 tablet (120 mg total) by mouth every 8 (eight) hours. 03/12/15  Yes Rigoberto Noel, MD  FLUoxetine (PROZAC) 20 MG capsule Take 3 capsules by mouth once daily   Yes Historical Provider, MD  furosemide (LASIX) 20 MG tablet Take 20 mg by mouth daily.    Yes Historical Provider, MD  HYDROcodone-acetaminophen (NORCO) 10-325 MG tablet Take 1 tablet by mouth every 6 (six) hours as needed for moderate pain.  06/04/15  Yes Historical Provider, MD  HYDROcodone-homatropine (HYCODAN) 5-1.5 MG/5ML syrup Take 5 mLs by mouth every 6 (six) hours as needed for cough. 06/17/15  Yes Magdalen Spatz, NP  ipratropium-albuterol (DUONEB) 0.5-2.5 (3) MG/3ML SOLN Take 3 mLs by nebulization 3 (three) times daily. 03/07/15  Yes Tammy S Parrett, NP  metoprolol succinate (TOPROL-XL) 25 MG 24 hr tablet Take 1 tablet (25 mg total) by mouth daily. 02/14/15  Yes Brittainy Erie Noe, PA-C  OXYGEN Inhale 2 L into the lungs continuous. 2-3L/min   Yes Historical  Provider, MD  pantoprazole (PROTONIX) 40 MG tablet TAKE ONE TABLET BY MOUTH TWICE DAILY 01/03/14  Yes Tasrif Ahmed, MD  potassium chloride SA (KLOR-CON M20) 20 MEQ tablet Take 10 mEq by mouth 2 (two) times daily.    Yes Historical Provider, MD  predniSONE (STERAPRED UNI-PAK 21 TAB) 10 MG (21) TBPK tablet Take 1 tablet (10 mg total) by mouth daily. Take 6 tabs by mouth daily  for 2 days, then 5 tabs for 2 days, then 4 tabs for 2 days, then 3 tabs for 2 days, 2 tabs for 2 days, then 1 tab by mouth daily for 2 days 07/01/15  Yes Isla Pence, MD  SPIRIVA HANDIHALER 18 MCG inhalation capsule PLACE ONE CAPSULE INTO INHALER AND INHALE DAILY 03/07/15  Yes Rigoberto Noel, MD  VENTOLIN HFA 108 (90 Base) MCG/ACT inhaler INHALE ONE TO TWO PUFFS BY MOUTH EVERY 6 HOURS AS  NEEDED FOR WHEEZING 03/07/15  Yes Rigoberto Noel, MD  HYDROcodone-acetaminophen (NORCO/VICODIN) 5-325 MG tablet Take 1 tablet by mouth every 4 (four) hours as needed. Patient not taking: Reported on 06/21/2015 06/01/15   Tanna Furry, MD    Physical Exam: Filed Vitals:   07/15/15 1400 07/15/15 1436 07/15/15 1443 07/15/15 1530  BP: 1'06/44 99/51 94/57 '$ 93/65  Pulse: 123 119 103 117  Temp:      TempSrc:      Resp: '25 21 23 26  '$ Height:      Weight:      SpO2: 95% 98% 100% 95%      Constitutional: NAD, Anxious, no distress, not using accessory muscles of respiration Filed Vitals:   07/15/15 1400 07/15/15 1436 07/15/15 1443 07/15/15 1530  BP: 1'06/44 99/51 94/57 '$ 93/65  Pulse: 123 119 103 117  Temp:      TempSrc:      Resp: '25 21 23 26  '$ Height:      Weight:      SpO2: 95% 98% 100% 95%   Eyes: PERRL, lids and conjunctivae normal ENMT: Dry mucous membranes, poor dental hygiene  Neck: normal, supple, no masses, no thyromegaly Respiratory: Poor air movement, scattered x-ray wheezes Cardiovascular: S1-S2/irregular rate and rhythm, no murmurs or rubs noted Abdomen: no tenderness, no masses palpated. No hepatosplenomegaly. Bowel sounds  positive.  Musculoskeletal: no clubbing / cyanosis. No joint deformity upper and lower extremities. Good ROM, no contractures. Normal muscle tone.  Skin: no rashes, lesions, ulcers. No induration Neurologic: CN 2-12 grossly intact. Sensation intact, DTR normal. Strength 5/5 in all 4.  Psychiatric: Anxious Extremities: No edema   Labs on Admission: I have personally reviewed following labs and imaging studies  CBC:  Recent Labs Lab 07/12/15 0944 07/15/15 1240  WBC 6.5 10.8*  NEUTROABS 4.9 9.3*  HGB 9.0* 9.4*  HCT 30.6* 32.8*  MCV 85.0 87.2  PLT 221 540   Basic Metabolic Panel:  Recent Labs Lab 07/12/15 0944 07/15/15 1240  NA 144 141  K 4.1 3.3*  CL  --  96*  CO2 36* 36*  GLUCOSE 107 165*  BUN 18.3 10  CREATININE 0.8 0.61  CALCIUM 8.5 8.7*   GFR: Estimated Creatinine Clearance: 67.8 mL/min (by C-G formula based on Cr of 0.61). Liver Function Tests:  Recent Labs Lab 07/12/15 0944 07/15/15 1240  AST <7 14*  ALT <9 11*  ALKPHOS 81 86  BILITOT 0.36 1.0  PROT 5.4* 6.7  ALBUMIN 2.9* 3.4*   No results for input(s): LIPASE, AMYLASE in the last 168 hours. No results for input(s): AMMONIA in the last 168 hours. Coagulation Profile: No results for input(s): INR, PROTIME in the last 168 hours. Cardiac Enzymes: No results for input(s): CKTOTAL, CKMB, CKMBINDEX, TROPONINI in the last 168 hours. BNP (last 3 results) No results for input(s): PROBNP in the last 8760 hours. HbA1C: No results for input(s): HGBA1C in the last 72 hours. CBG:  Recent Labs Lab 07/12/15 1021  GLUCAP 109*   Lipid Profile: No results for input(s): CHOL, HDL, LDLCALC, TRIG, CHOLHDL, LDLDIRECT in the last 72 hours. Thyroid Function Tests: No results for input(s): TSH, T4TOTAL, FREET4, T3FREE, THYROIDAB in the last 72 hours. Anemia Panel: No results for input(s): VITAMINB12, FOLATE, FERRITIN, TIBC, IRON, RETICCTPCT in the last 72 hours. Urine analysis:    Component Value Date/Time    COLORURINE YELLOW 03/28/2015 1153   APPEARANCEUR CLOUDY* 03/28/2015 1153   LABSPEC 1.017 03/28/2015 1153   PHURINE 8.0 03/28/2015 1153  GLUCOSEU NEGATIVE 03/28/2015 [place] 03/28/2015 1153   [place] 03/28/2015 1153   [place] 03/28/2015 1153   PROTEINUR NEGATIVE 03/28/2015 1153   UROBILINOGEN 0.2 08/29/2014 0954   NITRITE NEGATIVE 03/28/2015 1153   LEUKOCYTESUR NEGATIVE 03/28/2015 1153   Sepsis Labs: '@LABRCNTIP'$ (procalcitonin:4,lacticidven:4) )No results found for this or any previous visit (from the past 240 hour(s)).   Radiological Exams on Admission: Dg Chest 2 View  07/15/2015  CLINICAL DATA:  Shortness of breath since 2 a.m. today.  Wheezing. EXAM: CHEST  2 VIEW COMPARISON:  06/30/2015. FINDINGS: Enlarged cardiac silhouette without significant change. The pulmonary vasculature and interstitial markings remain prominent. Interval small amount of ill-defined opacity at the left lung base . No pleural fluid. Diffuse osteopenia. Tortuous aorta. IMPRESSION: 1. Mild left basilar atelectasis or pneumonia. 2. Stable cardiomegaly, pulmonary vascular congestion and interstitial lung disease. Electronically Signed   By: Claudie Revering M.D.   On: 07/15/2015 14:02   Ct Angio Chest Pe W/cm &/or Wo Cm  07/15/2015  CLINICAL DATA:  Per EMS, patient states she has been short of breath since 2am today. Lung ca Pt. C/o shortness of breath, history of non-small cell lung carcinoma dx'd 02/2015, xrt complete, shortness of breath, history of non-small cell lung carcinoma dx'd 02/2015, xrt complete, pt. states onset of severe shortness of breath yesterday evening, cough and congestion, hx of COPD, hypoxia, EXAM: CT ANGIOGRAPHY CHEST WITH CONTRAST TECHNIQUE: Multidetector CT imaging of the chest was performed using the standard protocol during bolus administration of intravenous contrast. Multiplanar CT image reconstructions and MIPs were obtained to evaluate the vascular anatomy.  CONTRAST:  100 mL Isovue 370 IV COMPARISON:  PET-CT 07/12/2015 and previous FINDINGS: Vascular: Left arm IV contrast injection. Innominate vein and SVC are patent. Right atrium and right ventricle are nondilated. Satisfactory opacification of pulmonary arteries noted, and there is no evidence of pulmonary emboli. Breathing motion degrades some of the images. Patent bilateral pulmonary veins drain into the left atrium. Mild left atrial enlargement. Scattered coronary calcifications. Adequate contrast opacification of the thoracic aorta with no evidence of dissection, aneurysm, or stenosis. There is bovine variant brachiocephalic arch anatomy without proximal stenosis. Descending segment is ectatic and atheromatous. Scattered calcifications throughout the aorta. Infrarenal segment not evaluated. Mediastinum/Lymph Nodes: Sub cm precarinal, AP window, and subcarinal lymph nodes. No pericardial effusion. Lungs/Pleura: Patchy airspace opacities peripherally in the right upper lobe, new since PET-CT. There is some atelectasis or infiltrate in the inferior and posterior aspect of the lingula and posteriorly in both lower lobes left greater than right. Motion degrades portions of examination. No pleural effusion. No pneumothorax. Upper abdomen: Cholelithiasis.  No acute abnormality. Musculoskeletal: Come T U/S compression deformities in the mid thoracic spine are stable since prior CT of 06/01/2015. No worrisome lytic or sclerotic bone lesion. Breathing motion degrades the coronal and sagittal reconstructions. Review of the MIP images confirms the above findings. IMPRESSION: 1. Negative for acute PE or thoracic aortic dissection. 2. New patchy airspace opacities in the right upper lobe, lingula, and posterior lung bases. 3. Atherosclerosis, including aortic hand coronary artery disease. Please note that although the presence of coronary artery calcium documents the presence of coronary artery disease, the severity of this  disease and any potential stenosis cannot be assessed on this non-gated CT examination. Assessment for potential risk factor modification, dietary therapy or pharmacologic therapy may be warranted, if clinically indicated. 4. Cholelithiasis. Electronically Signed   By: Lucrezia Europe M.D.   On: 07/15/2015  15:53    EKG: Independently reviewed. Atrial fibrillation, and PVCs noted  Assessment/Plan  1. Acute on chronic hypoxic respiratory failure -Secondary to community-acquired pneumonia and COPD exacerbation -Continue IV Levaquin, IV Solu-Medrol, DuoNeb scheduled, Xopenex when necessary -Add Mucinex -ABG reassuring, no need for NIPPV at this time  2. COPD exacerbation -As above  3. Community-acquired pneumonia -As above  4. Stage IIB non-small cell lung cancer -Status post XRT in 3/1 -Followed by Dr. Earlie Server and [name] -Recent PET scan, defer to oncology  5. Paroxysmal atrial fibrillation -Heart rate slightly higher in the setting of COPD flare, and beta agonists, didn't take her a.m. diltiazem dose which was just given. -Continue home dose of metoprolol as well -Her chadsvasc score is 4, she has declined anticoagulation per cardiology notes -Continue aspirin, follow-up with cardiology  6. Chronic diastolic CHF -Last echocardiogram with preserved ejection fraction -Continue Lasix by mouth, appears compensated at this time -We'll add a BNP to today's labs  7. Anxiety/depression/chronic pain -Continue home regimen of Prozac/Busoar/Valium/hydrocodone  8. Hypokalemia -Replace  DVT prophylaxis: Lovenox  Code Status: Reviewed with patient, DO NOT RESUSCITATE  Family Communication: No family at bedside Disposition Plan: Home when improved, suspect 3-4 day long hospitalization  Consults : None  Admission status: inpatient   Domenic Polite MD [facility] Pager [phone]  If 7PM-7AM, please contact night-coverage [email or web address] Password [email or web address]  07/15/2015, 5:14 PM

## 2015-07-15 NOTE — ED Notes (Addendum)
Per EMS, patient states she has been short of breath since 2am today. Patient was wheezing upon EMS arrival. 10 mg of albuterol, 1 mg atrovent, 125 mg of solumedrol IV. 2 L [place] at home normally. Home health nurse states she could not get an oxygen saturation aboce 80% at home prior to EMS arrival. Hx of lung cancer and COPD. Patient is from home. Patient is alert and oriented.

## 2015-07-15 NOTE — ED Notes (Signed)
Unable to obtain accurate blood pressure or EKG at this time due to patient's shaking. MD aware.

## 2015-07-15 NOTE — ED Notes (Signed)
Patient's oxygen fluctuating from 87-95% on 2 L [place]. MD made aware.

## 2015-07-15 NOTE — ED Notes (Signed)
Patient transported to X-ray 

## 2015-07-15 NOTE — Progress Notes (Signed)
Utilization Review completed.  Cristo Ausburn RN CM  

## 2015-07-15 NOTE — ED Notes (Signed)
MD at bedside. 

## 2015-07-15 NOTE — ED Notes (Signed)
Pt can go up to floor at 17:49.

## 2015-07-16 DIAGNOSIS — C3412 Malignant neoplasm of upper lobe, left bronchus or lung: Secondary | ICD-10-CM

## 2015-07-16 DIAGNOSIS — J189 Pneumonia, unspecified organism: Secondary | ICD-10-CM

## 2015-07-16 DIAGNOSIS — J449 Chronic obstructive pulmonary disease, unspecified: Secondary | ICD-10-CM

## 2015-07-16 LAB — BASIC METABOLIC PANEL
Anion gap: 9 (ref 5–15)
BUN: 14 mg/dL (ref 6–20)
CALCIUM: 8.6 mg/dL — AB (ref 8.9–10.3)
CO2: 33 mmol/L — ABNORMAL HIGH (ref 22–32)
CREATININE: 0.67 mg/dL (ref 0.44–1.00)
Chloride: 97 mmol/L — ABNORMAL LOW (ref 101–111)
GFR calc Af Amer: >60 mL/min (ref >60)
Glucose, Bld: 147 mg/dL — ABNORMAL HIGH (ref 65–99)
POTASSIUM: 3.6 mmol/L (ref 3.5–5.1)
SODIUM: 139 mmol/L (ref 135–145)

## 2015-07-16 LAB — INFLUENZA PANEL BY PCR (TYPE A & B)
H1N1 flu by pcr: NOT DETECTED
Influenza A By PCR: NEGATIVE
Influenza B By PCR: NEGATIVE

## 2015-07-16 LAB — CBC
HCT: 30.4 % — ABNORMAL LOW (ref 36.0–46.0)
Hemoglobin: 8.8 g/dL — ABNORMAL LOW (ref 12.0–15.0)
MCH: 25.6 pg — AB (ref 26.0–34.0)
MCHC: 28.9 g/dL — AB (ref 30.0–36.0)
MCV: 88.4 fL (ref 78.0–100.0)
PLATELETS: 185 10*3/uL (ref 150–400)
RBC: 3.44 MIL/uL — AB (ref 3.87–5.11)
RDW: 17.1 % — AB (ref 11.5–15.5)
WBC: 8 10*3/uL (ref 4.0–10.5)

## 2015-07-16 LAB — BRAIN NATRIURETIC PEPTIDE: B NATRIURETIC PEPTIDE 5: 355.2 pg/mL — AB (ref 0.0–100.0)

## 2015-07-16 MED ORDER — POTASSIUM CHLORIDE CRYS ER 20 MEQ PO TBCR
40.0000 meq | EXTENDED_RELEASE_TABLET | Freq: Every day | ORAL | Status: DC
  Administered 2015-07-16 – 2015-07-18 (×3): 40 meq via ORAL
  Filled 2015-07-16 (×3): qty 2

## 2015-07-16 MED ORDER — BACITRACIN-NEOMYCIN-POLYMYXIN 400-5-5000 EX OINT
TOPICAL_OINTMENT | Freq: Two times a day (BID) | CUTANEOUS | Status: DC
  Administered 2015-07-16: 1 via TOPICAL
  Administered 2015-07-16 – 2015-07-18 (×4): via TOPICAL

## 2015-07-16 MED ORDER — FUROSEMIDE 20 MG PO TABS
20.0000 mg | ORAL_TABLET | Freq: Every day | ORAL | Status: DC
  Administered 2015-07-16 – 2015-07-18 (×3): 20 mg via ORAL
  Filled 2015-07-16 (×3): qty 1

## 2015-07-16 MED ORDER — METHYLPREDNISOLONE SODIUM SUCC 125 MG IJ SOLR
60.0000 mg | Freq: Two times a day (BID) | INTRAMUSCULAR | Status: DC
  Administered 2015-07-16 – 2015-07-17 (×2): 60 mg via INTRAVENOUS
  Filled 2015-07-16 (×2): qty 2

## 2015-07-16 NOTE — Progress Notes (Signed)
Pt. Requesting cough medicine at the beginning of shift. RN paged NP at 2000, no new orders given. Pt. Had low BP of 91/47 and HR of 86. Cardizem held. NP notified, no new orders given. Will continue to monitor

## 2015-07-16 NOTE — Consult Note (Addendum)
   [facility] CM Inpatient Consult   07/16/2015  FRYDA MOLENDA May 09, 1945 [id number]   Patient has been active with [facility] program in the recent past. Chart reviewed. Noted she was discharged from [facility] program on  May 16th. Spoke with Ms. Annye Asa at bedside to discuss restarting services with [facility]. She pleasantly but adamantly states she does want to be followed up by [facility]. States she has what she needs with home health and that her home health nurse works really closely with her and that is all she needs. Discussed that [facility] could continue to provide the additional support in addition to home health. She continues to decline. Ms. Taha nurse was at bedside during conversation as well. Contact information and brochure left for her to call if she should change her mind. Will make inpatient RNCM aware that Ms. Garlick declines [facility]. Please see chart review then notes tab for [facility] RNCM patient outreach correspondence with Ms. Annye Asa.   [name], MSN-Ed, RN,BSN [facility] Liaison [phone]

## 2015-07-16 NOTE — Progress Notes (Signed)
Subjective: The patient is seen and examined today. She has a history of stage IIB (T3, N0, M0) non-small cell lung cancer, squamous cell carcinoma presented with left upper lobe obstructing lesion diagnosed in January 2017. She is status post curative radiotherapy under the care of Dr. Isidore Moos. The patient was noted on previous CT scan of the chest to have persistent left upper lobe collapse. I ordered a PET scan which was performed 2 days ago for evaluation of this abnormality and the patient was supposed to come to the office tomorrow for discussion of her PET scan results and recommendation regarding her condition. She was admitted yesterday to [facility] with worsening dyspnea suspicious for COPD exacerbation as well as upper respiratory infection. She had several family members diagnosed with Flu recently. She feels better today but continues to have shortness of breath and she is on home oxygen. She denied having any fever or chills. She has no nausea or vomiting.  Objective: Vital signs in last 24 hours: Temp:  [97.8 F (36.6 C)-98.4 F (36.9 C)] 98.4 F (36.9 C) (06/06 1451) Pulse Rate:  [86-111] 97 (06/06 1451) Resp:  [21-24] 21 (06/06 1451) BP: (88-157)/(44-136) 90/44 mmHg (06/06 1451) SpO2:  [94 %-100 %] 96 % (06/06 1451) Weight:  [164 lb 11.2 oz (74.707 kg)] 164 lb 11.2 oz (74.707 kg) (06/05 1852)  Intake/Output from previous day: 06/05 0701 - 06/06 0700 In: 5 [I.V.:5] Out: -  Intake/Output this shift: Total I/O In: 480 [P.O.:480] Out: 6 [Urine:5; Stool:1]  General appearance: alert, cooperative, fatigued and no distress Resp: wheezes bilaterally Cardio: regular rate and rhythm, S1, S2 normal, no murmur, click, rub or gallop GI: soft, non-tender; bowel sounds normal; no masses,  no organomegaly Extremities: extremities normal, atraumatic, no cyanosis or edema  Lab Results:   Recent Labs  07/15/15 1240 07/16/15 0510  WBC 10.8* 8.0  HGB 9.4* 8.8*  HCT 32.8*  30.4*  PLT 194 185   BMET  Recent Labs  07/15/15 1240 07/16/15 0510  NA 141 139  K 3.3* 3.6  CL 96* 97*  CO2 36* 33*  GLUCOSE 165* 147*  BUN 10 14  CREATININE 0.61 0.67  CALCIUM 8.7* 8.6*    Studies/Results: Dg Chest 2 View  07/15/2015  CLINICAL DATA:  Shortness of breath since 2 a.m. today.  Wheezing. EXAM: CHEST  2 VIEW COMPARISON:  06/30/2015. FINDINGS: Enlarged cardiac silhouette without significant change. The pulmonary vasculature and interstitial markings remain prominent. Interval small amount of ill-defined opacity at the left lung base . No pleural fluid. Diffuse osteopenia. Tortuous aorta. IMPRESSION: 1. Mild left basilar atelectasis or pneumonia. 2. Stable cardiomegaly, pulmonary vascular congestion and interstitial lung disease. Electronically Signed   By: Claudie Revering M.D.   On: 07/15/2015 14:02   Ct Angio Chest Pe W/cm &/or Wo Cm  07/15/2015  CLINICAL DATA:  Per EMS, patient states she has been short of breath since 2am today. Lung ca Pt. C/o shortness of breath, history of non-small cell lung carcinoma dx'd 02/2015, xrt complete, shortness of breath, history of non-small cell lung carcinoma dx'd 02/2015, xrt complete, pt. states onset of severe shortness of breath yesterday evening, cough and congestion, hx of COPD, hypoxia, EXAM: CT ANGIOGRAPHY CHEST WITH CONTRAST TECHNIQUE: Multidetector CT imaging of the chest was performed using the standard protocol during bolus administration of intravenous contrast. Multiplanar CT image reconstructions and MIPs were obtained to evaluate the vascular anatomy. CONTRAST:  100 mL Isovue 370 IV COMPARISON:  PET-CT 07/12/2015 and  previous FINDINGS: Vascular: Left arm IV contrast injection. Innominate vein and SVC are patent. Right atrium and right ventricle are nondilated. Satisfactory opacification of pulmonary arteries noted, and there is no evidence of pulmonary emboli. Breathing motion degrades some of the images. Patent bilateral pulmonary  veins drain into the left atrium. Mild left atrial enlargement. Scattered coronary calcifications. Adequate contrast opacification of the thoracic aorta with no evidence of dissection, aneurysm, or stenosis. There is bovine variant brachiocephalic arch anatomy without proximal stenosis. Descending segment is ectatic and atheromatous. Scattered calcifications throughout the aorta. Infrarenal segment not evaluated. Mediastinum/Lymph Nodes: Sub cm precarinal, AP window, and subcarinal lymph nodes. No pericardial effusion. Lungs/Pleura: Patchy airspace opacities peripherally in the right upper lobe, new since PET-CT. There is some atelectasis or infiltrate in the inferior and posterior aspect of the lingula and posteriorly in both lower lobes left greater than right. Motion degrades portions of examination. No pleural effusion. No pneumothorax. Upper abdomen: Cholelithiasis.  No acute abnormality. Musculoskeletal: Come T U/S compression deformities in the mid thoracic spine are stable since prior CT of 06/01/2015. No worrisome lytic or sclerotic bone lesion. Breathing motion degrades the coronal and sagittal reconstructions. Review of the MIP images confirms the above findings. IMPRESSION: 1. Negative for acute PE or thoracic aortic dissection. 2. New patchy airspace opacities in the right upper lobe, lingula, and posterior lung bases. 3. Atherosclerosis, including aortic hand coronary artery disease. Please note that although the presence of coronary artery calcium documents the presence of coronary artery disease, the severity of this disease and any potential stenosis cannot be assessed on this non-gated CT examination. Assessment for potential risk factor modification, dietary therapy or pharmacologic therapy may be warranted, if clinically indicated. 4. Cholelithiasis. Electronically Signed   By: Lucrezia Europe M.D.   On: 07/15/2015 15:53    Medications: I have reviewed the patient's current medications.  CODE  STATUS: No CODE BLUE  Assessment/Plan: 1) history of stage IIb non-small cell lung cancer status post curative radiotherapy and the patient is currently on observation. The recent PET scan showed no concerning finding for disease recurrence. I discussed the PET scan results with the patient today. I recommended for her to continue on observation and routine follow-up visit by Dr. Isidore Moos and [name]. She was not a good candidate for any adjuvant chemotherapy. 2) COPD: The patient will continue her current treatment with inhalers and nebulizer as prescribed by her pulmonologist and the hospitalist. She is also on high-dose Solu-Medrol. 3) questionable pneumonia, she is currently on Levaquin. Thank you for taking good care of Mrs. Sobel. Please call if you have any questions.  LOS: 1 day    Jacqualyn Sedgwick K. 07/16/2015

## 2015-07-16 NOTE — Progress Notes (Signed)
PROGRESS NOTE    Kelsey Garcia  YSA:[id number] DOB: 1945/06/03 DOA: 07/15/2015 PCP: Darden Amber, PA  Outpatient Specialists:Pulm Timmothy Euler and Onc Dr.Mohamed Brief Narrative:Kelsey Garcia is a 70 y.o. female with past medical history of COPD/chronic respiratory failure on 2-3 L home oxygen, stage II B non-small cell lung cancer status post XRT, chronic diastolic CHF, history of paroxysmal atrial fibrillation, depression, anxiety, history of narcotic dependence, chronic pain presents to the ER with the above complaints. Patient reports chronic dyspnea at baseline, which is worse with minimal activity however this significantly worsened x1day PTA associated with productive cough, congestion, chest tightness, wheezing, shortness of breath. She had a CT angiogram of the chest done which showed no PE, new patchy airspace opacities in the right upper lobe, lingula and posterior lung bases concerning for pneumonia. She was also noted to be wheezing diffusely. Improving on Abx/steroids, nebs  Assessment & Plan:  1. Acute on chronic hypoxic respiratory failure -Secondary to community-acquired pneumonia and COPD exacerbation -improving well on IV Levaquin, IV Solu-Medrol, DuoNeb scheduled, Xopenex when necessary -cut down IV solumedrol today -Mucinex -pt tells me now that 2 grand daughters diagnosed with FLu, will check FLu PCR  2. COPD exacerbation -As above  3. Community-acquired pneumonia -As above  4. Stage IIB non-small cell lung cancer -Status post XRT in 3/1 -Followed by Dr. Earlie Server and [name] -Recent PET scan, defer to oncology -consulted Dr.Mohamed, he will see pt to discuss PET findings  5. Paroxysmal atrial fibrillation -Heart rate was slightly higher in the setting of COPD flare, and beta agonists,  -improved, continue diltiazem at home dose(unusual regimen) and home dose of metoprolol as well -Her chadsvasc score is 4, she has declined anticoagulation per cardiology  notes -Continue aspirin, follow-up with cardiology  6. Chronic diastolic CHF -Last echocardiogram with preserved ejection fraction -Continue Lasix by mouth, appears compensated at this time  7. Anxiety/depression/chronic pain -Continue home regimen of Prozac/Busoar/Valium/hydrocodone  8. Hypokalemia -Replaced  DVT prophylaxis: Lovenox  Code Status: Reviewed with patient, DO NOT RESUSCITATE  Family Communication: No family at bedside Disposition Plan: Home in ?2days  Consultants:   D/w Dr.Mohamed   Antimicrobials: levaquin   Subjective: Feeling much better, breathing improving, " i feel more human now"  Objective: Filed Vitals:   07/16/15 0321 07/16/15 0635 07/16/15 0758 07/16/15 1222  BP:  95/65    Pulse: 96 99    Temp:  97.8 F (36.6 C)    TempSrc:  Oral    Resp: 22 24    Height:      Weight:      SpO2: 96% 100% 95% 94%    Intake/Output Summary (Last 24 hours) at 07/16/15 1236 Last data filed at 07/15/15 2135  Gross per 24 hour  Intake      5 ml  Output      0 ml  Net      5 ml   Filed Weights   07/15/15 1206 07/15/15 1852  Weight: 73.936 kg (163 lb) 74.707 kg (164 lb 11.2 oz)    Examination:  General exam: Appears calm and comfortable  Respiratory system: rare wheezes, improved air movement Cardiovascular system: S1 & S2 heard, RRR. No JVD, murmurs, rubs, gallops or clicks. No pedal edema. Gastrointestinal system: Abdomen is nondistended, soft and nontender. No organomegaly or masses felt. Normal bowel sounds heard. Central nervous system: Alert and oriented. No focal neurological deficits. Extremities: Symmetric 5 x 5 power. Skin: No rashes, lesions or ulcers Psychiatry: Judgement and  insight appear normal. Mood & affect appropriate.     Data Reviewed: I have personally reviewed following labs and imaging studies  CBC:  Recent Labs Lab 07/12/15 0944 07/15/15 1240 07/16/15 0510  WBC 6.5 10.8* 8.0  NEUTROABS 4.9 9.3*  --   HGB 9.0* 9.4*  8.8*  HCT 30.6* 32.8* 30.4*  MCV 85.0 87.2 88.4  PLT 221 194 235   Basic Metabolic Panel:  Recent Labs Lab 07/12/15 0944 07/15/15 1240 07/16/15 0510  NA 144 141 139  K 4.1 3.3* 3.6  CL  --  96* 97*  CO2 36* 36* 33*  GLUCOSE 107 165* 147*  BUN 18.'3 10 14  '$ CREATININE 0.8 0.61 0.67  CALCIUM 8.5 8.7* 8.6*   GFR: Estimated Creatinine Clearance: 68.1 mL/min (by C-G formula based on Cr of 0.67). Liver Function Tests:  Recent Labs Lab 07/12/15 0944 07/15/15 1240  AST <7 14*  ALT <9 11*  ALKPHOS 81 86  BILITOT 0.36 1.0  PROT 5.4* 6.7  ALBUMIN 2.9* 3.4*   No results for input(s): LIPASE, AMYLASE in the last 168 hours. No results for input(s): AMMONIA in the last 168 hours. Coagulation Profile: No results for input(s): INR, PROTIME in the last 168 hours. Cardiac Enzymes: No results for input(s): CKTOTAL, CKMB, CKMBINDEX, TROPONINI in the last 168 hours. BNP (last 3 results) No results for input(s): PROBNP in the last 8760 hours. HbA1C: No results for input(s): HGBA1C in the last 72 hours. CBG:  Recent Labs Lab 07/12/15 1021  GLUCAP 109*   Lipid Profile: No results for input(s): CHOL, HDL, LDLCALC, TRIG, CHOLHDL, LDLDIRECT in the last 72 hours. Thyroid Function Tests: No results for input(s): TSH, T4TOTAL, FREET4, T3FREE, THYROIDAB in the last 72 hours. Anemia Panel: No results for input(s): VITAMINB12, FOLATE, FERRITIN, TIBC, IRON, RETICCTPCT in the last 72 hours. Urine analysis:    Component Value Date/Time   COLORURINE YELLOW 03/28/2015 1153   APPEARANCEUR CLOUDY* 03/28/2015 1153   LABSPEC 1.017 03/28/2015 1153   PHURINE 8.0 03/28/2015 1153   GLUCOSEU NEGATIVE 03/28/2015 1153   HGBUR NEGATIVE 03/28/2015 [place] 03/28/2015 1153   KETONESUR NEGATIVE 03/28/2015 1153   PROTEINUR NEGATIVE 03/28/2015 1153   UROBILINOGEN 0.2 08/29/2014 0954   NITRITE NEGATIVE 03/28/2015 1153   LEUKOCYTESUR NEGATIVE 03/28/2015 1153   Sepsis  Labs: '@LABRCNTIP'$ (procalcitonin:4,lacticidven:4)  )No results found for this or any previous visit (from the past 240 hour(s)).       Radiology Studies: Dg Chest 2 View  07/15/2015  CLINICAL DATA:  Shortness of breath since 2 a.m. today.  Wheezing. EXAM: CHEST  2 VIEW COMPARISON:  06/30/2015. FINDINGS: Enlarged cardiac silhouette without significant change. The pulmonary vasculature and interstitial markings remain prominent. Interval small amount of ill-defined opacity at the left lung base . No pleural fluid. Diffuse osteopenia. Tortuous aorta. IMPRESSION: 1. Mild left basilar atelectasis or pneumonia. 2. Stable cardiomegaly, pulmonary vascular congestion and interstitial lung disease. Electronically Signed   By: Claudie Revering M.D.   On: 07/15/2015 14:02   Ct Angio Chest Pe W/cm &/or Wo Cm  07/15/2015  CLINICAL DATA:  Per EMS, patient states she has been short of breath since 2am today. Lung ca Pt. C/o shortness of breath, history of non-small cell lung carcinoma dx'd 02/2015, xrt complete, shortness of breath, history of non-small cell lung carcinoma dx'd 02/2015, xrt complete, pt. states onset of severe shortness of breath yesterday evening, cough and congestion, hx of COPD, hypoxia, EXAM: CT ANGIOGRAPHY CHEST WITH CONTRAST TECHNIQUE: Multidetector  CT imaging of the chest was performed using the standard protocol during bolus administration of intravenous contrast. Multiplanar CT image reconstructions and MIPs were obtained to evaluate the vascular anatomy. CONTRAST:  100 mL Isovue 370 IV COMPARISON:  PET-CT 07/12/2015 and previous FINDINGS: Vascular: Left arm IV contrast injection. Innominate vein and SVC are patent. Right atrium and right ventricle are nondilated. Satisfactory opacification of pulmonary arteries noted, and there is no evidence of pulmonary emboli. Breathing motion degrades some of the images. Patent bilateral pulmonary veins drain into the left atrium. Mild left atrial enlargement.  Scattered coronary calcifications. Adequate contrast opacification of the thoracic aorta with no evidence of dissection, aneurysm, or stenosis. There is bovine variant brachiocephalic arch anatomy without proximal stenosis. Descending segment is ectatic and atheromatous. Scattered calcifications throughout the aorta. Infrarenal segment not evaluated. Mediastinum/Lymph Nodes: Sub cm precarinal, AP window, and subcarinal lymph nodes. No pericardial effusion. Lungs/Pleura: Patchy airspace opacities peripherally in the right upper lobe, new since PET-CT. There is some atelectasis or infiltrate in the inferior and posterior aspect of the lingula and posteriorly in both lower lobes left greater than right. Motion degrades portions of examination. No pleural effusion. No pneumothorax. Upper abdomen: Cholelithiasis.  No acute abnormality. Musculoskeletal: Come T U/S compression deformities in the mid thoracic spine are stable since prior CT of 06/01/2015. No worrisome lytic or sclerotic bone lesion. Breathing motion degrades the coronal and sagittal reconstructions. Review of the MIP images confirms the above findings. IMPRESSION: 1. Negative for acute PE or thoracic aortic dissection. 2. New patchy airspace opacities in the right upper lobe, lingula, and posterior lung bases. 3. Atherosclerosis, including aortic hand coronary artery disease. Please note that although the presence of coronary artery calcium documents the presence of coronary artery disease, the severity of this disease and any potential stenosis cannot be assessed on this non-gated CT examination. Assessment for potential risk factor modification, dietary therapy or pharmacologic therapy may be warranted, if clinically indicated. 4. Cholelithiasis. Electronically Signed   By: Lucrezia Europe M.D.   On: 07/15/2015 15:53        Scheduled Meds: . antiseptic oral rinse  7 mL Mouth Rinse q12n4p  . aspirin  650 mg Oral Q breakfast  . busPIRone  10 mg Oral  TID  . chlorhexidine  15 mL Mouth Rinse BID  . diltiazem  120 mg Oral Q8H  . enoxaparin (LOVENOX) injection  40 mg Subcutaneous Q24H  . FLUoxetine  40 mg Oral Daily  . furosemide  20 mg Oral Daily  . guaiFENesin  600 mg Oral BID  . ipratropium-albuterol  3 mL Nebulization QID  . levofloxacin (LEVAQUIN) IV  750 mg Intravenous Q24H  . methylPREDNISolone (SOLU-MEDROL) injection  80 mg Intravenous Q12H  . metoprolol succinate  25 mg Oral Daily  . mometasone-formoterol  2 puff Inhalation BID  . pantoprazole  40 mg Oral BID  . potassium chloride  40 mEq Oral Daily  . sodium chloride flush  3 mL Intravenous Q12H   Continuous Infusions:    LOS: 1 day    Time spent: 92mn    PDomenic Polite MD [facility] Pager [phone] If 7PM-7AM, please contact night-coverage [email or web address] Password [email or web address]/07/2015, 12:36 PM

## 2015-07-17 ENCOUNTER — Ambulatory Visit: Payer: Medicare Other | Admitting: Internal Medicine

## 2015-07-17 DIAGNOSIS — F418 Other specified anxiety disorders: Secondary | ICD-10-CM

## 2015-07-17 DIAGNOSIS — J441 Chronic obstructive pulmonary disease with (acute) exacerbation: Principal | ICD-10-CM

## 2015-07-17 DIAGNOSIS — C3492 Malignant neoplasm of unspecified part of left bronchus or lung: Secondary | ICD-10-CM

## 2015-07-17 DIAGNOSIS — I5032 Chronic diastolic (congestive) heart failure: Secondary | ICD-10-CM

## 2015-07-17 LAB — CBC
HCT: 29.8 % — ABNORMAL LOW (ref 36.0–46.0)
Hemoglobin: 8.6 g/dL — ABNORMAL LOW (ref 12.0–15.0)
MCH: 25.7 pg — ABNORMAL LOW (ref 26.0–34.0)
MCHC: 28.9 g/dL — ABNORMAL LOW (ref 30.0–36.0)
MCV: 89.2 fL (ref 78.0–100.0)
PLATELETS: 195 10*3/uL (ref 150–400)
RBC: 3.34 MIL/uL — AB (ref 3.87–5.11)
RDW: 17.1 % — AB (ref 11.5–15.5)
WBC: 8.3 10*3/uL (ref 4.0–10.5)

## 2015-07-17 LAB — BASIC METABOLIC PANEL
ANION GAP: 5 (ref 5–15)
BUN: 27 mg/dL — ABNORMAL HIGH (ref 6–20)
CALCIUM: 9.1 mg/dL (ref 8.9–10.3)
CO2: 36 mmol/L — ABNORMAL HIGH (ref 22–32)
Chloride: 99 mmol/L — ABNORMAL LOW (ref 101–111)
Creatinine, Ser: 0.86 mg/dL (ref 0.44–1.00)
Glucose, Bld: 164 mg/dL — ABNORMAL HIGH (ref 65–99)
POTASSIUM: 4.9 mmol/L (ref 3.5–5.1)
Sodium: 140 mmol/L (ref 135–145)

## 2015-07-17 MED ORDER — LORATADINE 10 MG PO TABS
10.0000 mg | ORAL_TABLET | Freq: Every day | ORAL | Status: DC
  Administered 2015-07-17 – 2015-07-18 (×2): 10 mg via ORAL
  Filled 2015-07-17 (×2): qty 1

## 2015-07-17 MED ORDER — METHYLPREDNISOLONE SODIUM SUCC 40 MG IJ SOLR
40.0000 mg | Freq: Two times a day (BID) | INTRAMUSCULAR | Status: DC
  Administered 2015-07-18: 40 mg via INTRAVENOUS
  Filled 2015-07-17 (×2): qty 1

## 2015-07-17 MED ORDER — LEVOFLOXACIN 750 MG PO TABS
750.0000 mg | ORAL_TABLET | Freq: Every day | ORAL | Status: DC
  Administered 2015-07-17 – 2015-07-18 (×2): 750 mg via ORAL
  Filled 2015-07-17 (×2): qty 1

## 2015-07-17 NOTE — Progress Notes (Signed)
PHARMACIST - PHYSICIAN COMMUNICATION DR:   Sloan Leiter CONCERNING: Antibiotic IV to Oral Route Change Policy  RECOMMENDATION: This patient is receiving Levaquin by the intravenous route.  Based on criteria approved by the Pharmacy and Therapeutics Committee, the antibiotic(s) is/are being converted to the equivalent oral dose form(s).   DESCRIPTION: These criteria include:  Patient being treated for a respiratory tract infection, urinary tract infection, cellulitis or clostridium difficile associated diarrhea if on metronidazole  The patient is not neutropenic and does not exhibit a GI malabsorption state  The patient is eating (either orally or via tube) and/or has been taking other orally administered medications for a least 24 hours  The patient is improving clinically and has a Tmax < 100.5  If you have questions about this conversion, please contact the Pharmacy Department  '[]'$   [phone] )  [facility] '[]'$   [phone] )  [facility] '[]'$   [phone] )  [facility] '[]'$   [phone] )  [facility] '[x]'$   [phone] )  [place], PharmD, BCPS Pager: [phone] 07/17/2015'@7'$ :22 AM

## 2015-07-17 NOTE — Progress Notes (Signed)
PROGRESS NOTE        PATIENT DETAILS Name: Kelsey Garcia Age: 70 y.o. Sex: female Date of Birth: 1945-11-12 Admit Date: 07/15/2015 Admitting Physician Domenic Polite, MD VEL:[id number],BPZWCH M, PA  Brief Narrative: Patient is a 70 y.o. female is a 70 y.o. female with past medical history of COPD/chronic respiratory failure on 2-3 L home oxygen, stage II B non-small cell lung cancer status post XRT, chronic diastolic CHF, history of paroxysmal atrial fibrillation admitted with acute on chronic hypoxic resp failure secondary to PNA and COPD exacerbation. Improving with IV Abx, steroids, and bronchodilators. Anticipate d/c home on 07/18/15  Subjective: Improving-SOB better-+ cough-feels like she needs one more day in the hospital.  Assessment/Plan: Principal Problem: Acute on chronic hypoxic respiratory failure: Secondary to CAP and COPD exacerbation. Much better, titrate back down to usual home O2 regimen. Continue IV Abx, steroids-but taper and bronchodilators.  Active Problems: Acute exacerbation of chronic obstructive pulmonary disease:much improved, only a few scattered rhonchi on exam. Appears comfortable, continue to taper Solumedrol, continue bronchodilators. If clinical improvement continues, suspect should be able to be discharged home tomorrow.  PNA: continues to cough (chronic issue)-but appears non acute. Is afebrile and without leukocytosis. Continue Levaquin. Influenza PCR negative (reports multiple family members with flu like symptoms)  Chronic Cough: claims to have significant but chronic allergic rhinitis/sinusitis-causing her to have cough.Start claritin, but intolerant to all kinds of nasal inhalers.   Anemia:secondary to chronic disease. No evidence of overt blood loss.Follow Hb periodically  Stage IIB non-small cell lung cancer:Status post XRT in 3/1. Seen by Dr Julien Nordmann on 6/6-who discussed findings of recent PET scan, which showed no concerning  finding for disease recurrence. Continue follow up as outpatient with Dr. Earlie Server and [name]  Paroxysmal atrial fibrillation:rate controlled with cardizem and metoprolol, has refused anticoagulation in the past, per cardiology notes (Dr Johnsie Cancel 06/25/14).  chadsvasc score is 4. Continue ASA.  Chronic diastolic CHF: compensated, continue Lasix.TTE 03/07/14-EF 50-55%  Anxiety/depression/chronic pain:Appears stable-Continue home regimen of Prozac/Busoar/Valium/hydrocodone  DVT Prophylaxis: Prophylactic Lovenox   Code Status:  DNR  Family Communication: Spouse at bedside-awake and alert-understanding of above outlined plan  Disposition Plan: Remain inpatient-home 6/8  Antimicrobial agents: IV Levaquin 6/5>>  Procedures: None  CONSULTS:  None  Time spent: 25 minutes-Greater than 50% of this time was spent in counseling, explanation of diagnosis, planning of further management, and coordination of care.  MEDICATIONS: Anti-infectives    Start     Dose/Rate Route Frequency Ordered Stop   07/17/15 1400  levofloxacin (LEVAQUIN) tablet 750 mg     750 mg Oral Daily 07/17/15 0722     07/16/15 1700  levofloxacin (LEVAQUIN) IVPB 750 mg  Status:  Discontinued     750 mg 100 mL/hr over 90 Minutes Intravenous Every 24 hours 07/15/15 1846 07/17/15 0722   07/15/15 1600  levofloxacin (LEVAQUIN) IVPB 750 mg     750 mg 100 mL/hr over 90 Minutes Intravenous  Once 07/15/15 1559 07/15/15 1941      Scheduled Meds: . antiseptic oral rinse  7 mL Mouth Rinse q12n4p  . aspirin  650 mg Oral Q breakfast  . busPIRone  10 mg Oral TID  . chlorhexidine  15 mL Mouth Rinse BID  . diltiazem  120 mg Oral Q8H  . enoxaparin (LOVENOX) injection  40 mg Subcutaneous Q24H  . FLUoxetine  40 mg Oral Daily  . furosemide  20 mg Oral Daily  . guaiFENesin  600 mg Oral BID  . ipratropium-albuterol  3 mL Nebulization QID  . levofloxacin  750 mg Oral Daily  . methylPREDNISolone (SOLU-MEDROL) injection  60 mg  Intravenous Q12H  . metoprolol succinate  25 mg Oral Daily  . mometasone-formoterol  2 puff Inhalation BID  . neomycin-bacitracin-polymyxin   Topical BID  . pantoprazole  40 mg Oral BID  . potassium chloride  40 mEq Oral Daily  . sodium chloride flush  3 mL Intravenous Q12H   Continuous Infusions:  PRN Meds:.diazepam, HYDROcodone-acetaminophen, levalbuterol   PHYSICAL EXAM: Vital signs: Filed Vitals:   07/17/15 0808 07/17/15 0927 07/17/15 0930 07/17/15 1207  BP:  105/58 105/58   Pulse:   85   Temp:      TempSrc:      Resp:      Height:      Weight:      SpO2: 94%   95%   Filed Weights   07/15/15 1206 07/15/15 1852 07/17/15 0512  Weight: 73.936 kg (163 lb) 74.707 kg (164 lb 11.2 oz) 75.5 kg (166 lb 7.2 oz)   Body mass index is 27.7 kg/(m^2).   Gen Exam: Awake and alert with clear speech. Not in any distress  Neck: Supple, No JVD.   Chest: only a few scattered rhonchi CVS: S1 S2 Regular, no murmurs.  Abdomen: soft, BS +, non tender, non distended.  Extremities: no edema, lower extremities warm to touch. Neurologic: Non Focal.   Skin: No Rash or lesions   Wounds: N/A.    LABORATORY DATA: CBC:  Recent Labs Lab 07/12/15 0944 07/15/15 1240 07/16/15 0510 07/17/15 0504  WBC 6.5 10.8* 8.0 8.3  NEUTROABS 4.9 9.3*  --   --   HGB 9.0* 9.4* 8.8* 8.6*  HCT 30.6* 32.8* 30.4* 29.8*  MCV 85.0 87.2 88.4 89.2  PLT 221 194 185 956    Basic Metabolic Panel:  Recent Labs Lab 07/12/15 0944 07/15/15 1240 07/16/15 0510 07/17/15 0504  NA 144 141 139 140  K 4.1 3.3* 3.6 4.9  CL  --  96* 97* 99*  CO2 36* 36* 33* 36*  GLUCOSE 107 165* 147* 164*  BUN 18.'3 10 14 '$ 27*  CREATININE 0.8 0.61 0.67 0.86  CALCIUM 8.5 8.7* 8.6* 9.1    GFR: Estimated Creatinine Clearance: 63.7 mL/min (by C-G formula based on Cr of 0.86).  Liver Function Tests:  Recent Labs Lab 07/12/15 0944 07/15/15 1240  AST <7 14*  ALT <9 11*  ALKPHOS 81 86  BILITOT 0.36 1.0  PROT 5.4* 6.7  ALBUMIN  2.9* 3.4*   No results for input(s): LIPASE, AMYLASE in the last 168 hours. No results for input(s): AMMONIA in the last 168 hours.  Coagulation Profile: No results for input(s): INR, PROTIME in the last 168 hours.  Cardiac Enzymes: No results for input(s): CKTOTAL, CKMB, CKMBINDEX, TROPONINI in the last 168 hours.  BNP (last 3 results) No results for input(s): PROBNP in the last 8760 hours.  HbA1C: No results for input(s): HGBA1C in the last 72 hours.  CBG:  Recent Labs Lab 07/12/15 1021  GLUCAP 109*    Lipid Profile: No results for input(s): CHOL, HDL, LDLCALC, TRIG, CHOLHDL, LDLDIRECT in the last 72 hours.  Thyroid Function Tests: No results for input(s): TSH, T4TOTAL, FREET4, T3FREE, THYROIDAB in the last 72 hours.  Anemia Panel: No results for input(s): VITAMINB12, FOLATE, FERRITIN, TIBC, IRON, RETICCTPCT in the  last 72 hours.  Urine analysis:    Component Value Date/Time   COLORURINE YELLOW 03/28/2015 1153   APPEARANCEUR CLOUDY* 03/28/2015 1153   LABSPEC 1.017 03/28/2015 1153   PHURINE 8.0 03/28/2015 1153   GLUCOSEU NEGATIVE 03/28/2015 1153   HGBUR NEGATIVE 03/28/2015 [place] 03/28/2015 1153   KETONESUR NEGATIVE 03/28/2015 1153   PROTEINUR NEGATIVE 03/28/2015 1153   UROBILINOGEN 0.2 08/29/2014 0954   NITRITE NEGATIVE 03/28/2015 1153   LEUKOCYTESUR NEGATIVE 03/28/2015 1153    Sepsis Labs: Lactic Acid, Venous    Component Value Date/Time   LATICACIDVEN 1.84 07/15/2015 1248    MICROBIOLOGY: No results found for this or any previous visit (from the past 240 hour(s)).  RADIOLOGY STUDIES/RESULTS: Dg Chest 2 View  07/15/2015  CLINICAL DATA:  Shortness of breath since 2 a.m. today.  Wheezing. EXAM: CHEST  2 VIEW COMPARISON:  06/30/2015. FINDINGS: Enlarged cardiac silhouette without significant change. The pulmonary vasculature and interstitial markings remain prominent. Interval small amount of ill-defined opacity at the left lung base .  No pleural fluid. Diffuse osteopenia. Tortuous aorta. IMPRESSION: 1. Mild left basilar atelectasis or pneumonia. 2. Stable cardiomegaly, pulmonary vascular congestion and interstitial lung disease. Electronically Signed   By: Claudie Revering M.D.   On: 07/15/2015 14:02   Dg Chest 2 View  07/01/2015  CLINICAL DATA:  Shortness of breath for 1 day.  Wheezing. EXAM: CHEST  2 VIEW COMPARISON:  Most recent chest radiograph 06/15/2015. Chest CT 06/01/2015 FINDINGS: Cardiomegaly is unchanged. Increased vascular congestion. There is peribronchial thickening. The right upper lobe interstitial opacities are stable from recent priors, better defined on CT. No new airspace disease or confluent lobar consolidation. No pleural effusion or pneumothorax. IMPRESSION: 1. Stable cardiomegaly. Increased vascular congestion from prior. Peribronchial cuffing may be secondary to pulmonary edema or bronchial thickening. 2. Unchanged interstitial opacities in the right upper lung, better defined on prior CT. Electronically Signed   By: Jeb Levering M.D.   On: 07/01/2015 00:35   Ct Angio Chest Pe W/cm &/or Wo Cm  07/15/2015  CLINICAL DATA:  Per EMS, patient states she has been short of breath since 2am today. Lung ca Pt. C/o shortness of breath, history of non-small cell lung carcinoma dx'd 02/2015, xrt complete, shortness of breath, history of non-small cell lung carcinoma dx'd 02/2015, xrt complete, pt. states onset of severe shortness of breath yesterday evening, cough and congestion, hx of COPD, hypoxia, EXAM: CT ANGIOGRAPHY CHEST WITH CONTRAST TECHNIQUE: Multidetector CT imaging of the chest was performed using the standard protocol during bolus administration of intravenous contrast. Multiplanar CT image reconstructions and MIPs were obtained to evaluate the vascular anatomy. CONTRAST:  100 mL Isovue 370 IV COMPARISON:  PET-CT 07/12/2015 and previous FINDINGS: Vascular: Left arm IV contrast injection. Innominate vein and SVC are  patent. Right atrium and right ventricle are nondilated. Satisfactory opacification of pulmonary arteries noted, and there is no evidence of pulmonary emboli. Breathing motion degrades some of the images. Patent bilateral pulmonary veins drain into the left atrium. Mild left atrial enlargement. Scattered coronary calcifications. Adequate contrast opacification of the thoracic aorta with no evidence of dissection, aneurysm, or stenosis. There is bovine variant brachiocephalic arch anatomy without proximal stenosis. Descending segment is ectatic and atheromatous. Scattered calcifications throughout the aorta. Infrarenal segment not evaluated. Mediastinum/Lymph Nodes: Sub cm precarinal, AP window, and subcarinal lymph nodes. No pericardial effusion. Lungs/Pleura: Patchy airspace opacities peripherally in the right upper lobe, new since PET-CT. There is some atelectasis or infiltrate in  the inferior and posterior aspect of the lingula and posteriorly in both lower lobes left greater than right. Motion degrades portions of examination. No pleural effusion. No pneumothorax. Upper abdomen: Cholelithiasis.  No acute abnormality. Musculoskeletal: Come T U/S compression deformities in the mid thoracic spine are stable since prior CT of 06/01/2015. No worrisome lytic or sclerotic bone lesion. Breathing motion degrades the coronal and sagittal reconstructions. Review of the MIP images confirms the above findings. IMPRESSION: 1. Negative for acute PE or thoracic aortic dissection. 2. New patchy airspace opacities in the right upper lobe, lingula, and posterior lung bases. 3. Atherosclerosis, including aortic hand coronary artery disease. Please note that although the presence of coronary artery calcium documents the presence of coronary artery disease, the severity of this disease and any potential stenosis cannot be assessed on this non-gated CT examination. Assessment for potential risk factor modification, dietary therapy or  pharmacologic therapy may be warranted, if clinically indicated. 4. Cholelithiasis. Electronically Signed   By: Lucrezia Europe M.D.   On: 07/15/2015 15:53   Nm Pet Image Restag (ps) Skull Base To Thigh  07/12/2015  CLINICAL DATA:  Subsequent Treatment strategy for squamous cell lung cancer. EXAM: NUCLEAR MEDICINE PET SKULL BASE TO THIGH TECHNIQUE: 8.1 mCi F-18 FDG was injected intravenously. Full-ring PET imaging was performed from the skull base to thigh after the radiotracer. CT data was obtained and used for attenuation correction and anatomic localization. FASTING BLOOD GLUCOSE:  Value: 109 mg/dl COMPARISON:  03/13/2015 FINDINGS: NECK No hypermetabolic lymph nodes in the neck. CHEST Continued interval improvement in the lingular airspace disease noted on today's study. No evidence for hypermetabolic FDG accumulation within the residual trace lingular opacity. The solitary focus of hypermetabolic activity seen in the left hilum on the previous study has also resolved in the interval. There is no evidence for mediastinal or hilar lymphadenopathy on the uninfused CT images obtained for attenuation correction. ABDOMEN/PELVIS No abnormal hypermetabolic activity within the liver, pancreas, adrenal glands, or spleen. No hypermetabolic lymph nodes in the abdomen or pelvis. Focal hypermetabolism in the region of the anus again noted, stable in the interval. Calcified gallstones again noted. There is abdominal aortic atherosclerosis without aneurysm. SKELETON No focal hypermetabolic activity to suggest skeletal metastasis. IMPRESSION: 1. Continued further decrease in lingular opacity and no residual lingular hypermetabolism on today's exam. 2. Interval resolution of the solitary hypermetabolic focus in the left hilum. No evidence for hypermetabolic metastatic disease in the mediastinum or left hilum on today's exam. 3. No evidence for hypermetabolic metastatic disease in the abdomen or pelvis. 4. Persistent hypermetabolism  in the anus. This may be physiologic, but neoplastic involvement cannot be excluded. Electronically Signed   By: Misty Stanley M.D.   On: 07/12/2015 13:06     LOS: 2 days   Oren Binet, MD  [facility] Pager:[phone]  If 7PM-7AM, please contact night-coverage [email or web address] Password [email or web address] 07/17/2015, 1:08 PM

## 2015-07-18 DIAGNOSIS — I480 Paroxysmal atrial fibrillation: Secondary | ICD-10-CM

## 2015-07-18 MED ORDER — LEVOFLOXACIN 750 MG PO TABS: 750.0000 mg | ORAL_TABLET | Freq: Every day | ORAL | Status: DC

## 2015-07-18 MED ORDER — PREDNISONE 10 MG (21) PO TBPK: 10.0000 mg | ORAL_TABLET | Freq: Every day | ORAL | Status: DC

## 2015-07-18 NOTE — Care Management Important Message (Signed)
Important Message  Patient Details  Name: Kelsey Garcia MRN: [id number] Date of Birth: 07-14-1945   Medicare Important Message Given:  Yes    Camillo Flaming 07/18/2015, 1:06 [place] Message  Patient Details  Name: Kelsey Garcia MRN: [id number] Date of Birth: [birth year]   Medicare Important Message Given:  Yes    Camillo Flaming 07/18/2015, 1:06 PM

## 2015-07-18 NOTE — Discharge Summary (Signed)
PATIENT DETAILS Name: Kelsey Garcia Age: 70 y.o. Sex: female Date of Birth: Apr 01, 1945 MRN: [id number]. Admitting Physician: Domenic Polite, MD MPN:[id number] M, PA  Admit Date: 07/15/2015 Discharge date: 07/18/2015  Recommendations for Outpatient Follow-up:  1. Please repeat Chest Xray or CT Chest in 3-4 weeks to document resolution of PNA.  2. Please repeat CBC/BMET at next visit   PRIMARY DISCHARGE DIAGNOSIS:  Principal Problem:   Acute exacerbation of chronic obstructive pulmonary disease (COPD) ([place]) Active Problems:   Depression with anxiety   Atrial fibrillation with rapid ventricular response (HCC)   COPD (chronic obstructive pulmonary disease) (HCC)   Chronic diastolic CHF (congestive heart failure) (HCC)   COPD exacerbation (HCC)   Squamous cell lung cancer ([place])      PAST MEDICAL HISTORY: Past Medical History  Diagnosis Date  . Atrial fibrillation ([place])   . Hypertension   . Insomnia   . Nonischemic cardiomyopathy ([place])   . Hyperlipidemia   . Anxiety   . Cholelithiasis   . Insomnia   . Mitral regurgitation     noted 2010  . H/O epistaxis   . Rhinitis, allergic   . CHF (congestive heart failure) ([place])   . Pneumonia     "several times w/exacerbations of the COPD; nothing in the last year" (07/15/2012)  . COPD (chronic obstructive pulmonary disease) ([place])     as of 7/13 on 2-3L, pfts 10/2008 with mod obstruction  . Chronic bronchitis with COPD (chronic obstructive pulmonary disease) ([place])   . Shortness of breath     "all the time right now" (07/15/2012)  . GERD (gastroesophageal reflux disease)   . QQPYPPJK(932.6)     "weekly" (07/15/2012)  . Migraines     "weekly for awhile; cleared up as I got older" (07/15/2012)  . DJD (degenerative joint disease)   . Arthritis     "all over" (07/15/2012)  . OCD (obsessive compulsive disorder)   . OCD (obsessive compulsive disorder)   . Depression     h/o SI; "last time I was really serious about it was ~ 1997" (07/15/2012)    . Thoracic vertebral fracture ([place]) 11/03/2014  . Primary lung cancer ([place])     DISCHARGE MEDICATIONS: Current Discharge Medication List    START taking these medications   Details  levofloxacin (LEVAQUIN) 750 MG tablet Take 1 tablet (750 mg total) by mouth daily. Qty: 2 tablet, Refills: 0      CONTINUE these medications which have CHANGED   Details  predniSONE (STERAPRED UNI-PAK 21 TAB) 10 MG (21) TBPK tablet Take 1 tablet (10 mg total) by mouth daily. Take 6 tabs by mouth daily  for 2 days, then 5 tabs for 2 days, then 4 tabs for 2 days, then 3 tabs for 2 days, 2 tabs for 2 days, then 1 tab by mouth daily for 2 days Qty: 42 tablet, Refills: 0      CONTINUE these medications which have NOT CHANGED   Details  aspirin 325 MG EC tablet Take 650 mg by mouth daily with breakfast.     budesonide-formoterol (SYMBICORT) 160-4.5 MCG/ACT inhaler Inhale 2 puffs into the lungs 2 (two) times daily.    busPIRone (BUSPAR) 10 MG tablet Take 10 mg by mouth 3 (three) times daily.    diazepam (VALIUM) 2 MG tablet 1 by mouth every 6 hours as needed Qty: 30 tablet, Refills: 0   Associated Diagnoses: Squamous cell lung cancer, left (HCC)    diltiazem (CARDIZEM) 120 MG tablet Take 1  tablet (120 mg total) by mouth every 8 (eight) hours. Qty: 90 tablet, Refills: 0    FLUoxetine (PROZAC) 20 MG capsule Take 3 capsules by mouth once daily    furosemide (LASIX) 20 MG tablet Take 20 mg by mouth daily.     HYDROcodone-homatropine (HYCODAN) 5-1.5 MG/5ML syrup Take 5 mLs by mouth every 6 (six) hours as needed for cough. Qty: 120 mL, Refills: 0    ipratropium-albuterol (DUONEB) 0.5-2.5 (3) MG/3ML SOLN Take 3 mLs by nebulization 3 (three) times daily. Qty: 360 mL, Refills: 3    metoprolol succinate (TOPROL-XL) 25 MG 24 hr tablet Take 1 tablet (25 mg total) by mouth daily. Qty: 90 tablet, Refills: 3    OXYGEN Inhale 2 L into the lungs continuous. 2-3L/min    pantoprazole (PROTONIX) 40 MG tablet TAKE  ONE TABLET BY MOUTH TWICE DAILY Qty: 60 tablet, Refills: 5    potassium chloride SA (KLOR-CON M20) 20 MEQ tablet Take 10 mEq by mouth 2 (two) times daily.     SPIRIVA HANDIHALER 18 MCG inhalation capsule PLACE ONE CAPSULE INTO INHALER AND INHALE DAILY Qty: 90 capsule, Refills: 0    VENTOLIN HFA 108 (90 Base) MCG/ACT inhaler INHALE ONE TO TWO PUFFS BY MOUTH EVERY 6 HOURS AS NEEDED FOR WHEEZING Qty: 18 each, Refills: 0    HYDROcodone-acetaminophen (NORCO/VICODIN) 5-325 MG tablet Take 1 tablet by mouth every 4 (four) hours as needed. Qty: 10 tablet, Refills: 0      STOP taking these medications     HYDROcodone-acetaminophen (NORCO) 10-325 MG tablet         ALLERGIES:   Allergies  Allergen Reactions  . Ace Inhibitors Cough  . Codeine Swelling    Face and lips swelling  Patient report Can tolerate hydrocodone  . Pseudoeph-Doxylamine-Dm-Apap Itching  . Diphenhydramine Hcl Other (See Comments)    "feels like my skin is crawling, and legs twitches"  . Erythromycin Diarrhea  . Nsaids Nausea And Vomiting  . Nyquil [Pseudoeph-Doxylamine-Dm-Apap] Itching  . Tramadol Hcl Other (See Comments)    stomach pain, hallucinations  . Fosamax [Alendronate] Other (See Comments)    Nausea and abdominal bloating.   . Tomato Other (See Comments)    Stomach swells  . Orange Fruit [Citrus] Rash  . Pseudoephedrine Palpitations    BRIEF HPI:  See H&P, Labs, Consult and Test reports for all details in brief,Patient is a 70 y.o. female is a 70 y.o. female with past medical history of COPD/chronic respiratory failure on 2-3 L home oxygen, stage II B non-small cell lung cancer status post XRT, chronic diastolic CHF, history of paroxysmal atrial fibrillation admitted with acute on chronic hypoxic resp failure secondary to PNA and COPD exacerbation.   CONSULTATIONS:   None  PERTINENT RADIOLOGIC STUDIES: Dg Chest 2 View  07/15/2015  CLINICAL DATA:  Shortness of breath since 2 a.m. today.  Wheezing.  EXAM: CHEST  2 VIEW COMPARISON:  06/30/2015. FINDINGS: Enlarged cardiac silhouette without significant change. The pulmonary vasculature and interstitial markings remain prominent. Interval small amount of ill-defined opacity at the left lung base . No pleural fluid. Diffuse osteopenia. Tortuous aorta. IMPRESSION: 1. Mild left basilar atelectasis or pneumonia. 2. Stable cardiomegaly, pulmonary vascular congestion and interstitial lung disease. Electronically Signed   By: Claudie Revering M.D.   On: 07/15/2015 14:02   Dg Chest 2 View  07/01/2015  CLINICAL DATA:  Shortness of breath for 1 day.  Wheezing. EXAM: CHEST  2 VIEW COMPARISON:  Most recent chest radiograph 06/15/2015. Chest  CT 06/01/2015 FINDINGS: Cardiomegaly is unchanged. Increased vascular congestion. There is peribronchial thickening. The right upper lobe interstitial opacities are stable from recent priors, better defined on CT. No new airspace disease or confluent lobar consolidation. No pleural effusion or pneumothorax. IMPRESSION: 1. Stable cardiomegaly. Increased vascular congestion from prior. Peribronchial cuffing may be secondary to pulmonary edema or bronchial thickening. 2. Unchanged interstitial opacities in the right upper lung, better defined on prior CT. Electronically Signed   By: Jeb Levering M.D.   On: 07/01/2015 00:35   Ct Angio Chest Pe W/cm &/or Wo Cm  07/15/2015  CLINICAL DATA:  Per EMS, patient states she has been short of breath since 2am today. Lung ca Pt. C/o shortness of breath, history of non-small cell lung carcinoma dx'd 02/2015, xrt complete, shortness of breath, history of non-small cell lung carcinoma dx'd 02/2015, xrt complete, pt. states onset of severe shortness of breath yesterday evening, cough and congestion, hx of COPD, hypoxia, EXAM: CT ANGIOGRAPHY CHEST WITH CONTRAST TECHNIQUE: Multidetector CT imaging of the chest was performed using the standard protocol during bolus administration of intravenous contrast.  Multiplanar CT image reconstructions and MIPs were obtained to evaluate the vascular anatomy. CONTRAST:  100 mL Isovue 370 IV COMPARISON:  PET-CT 07/12/2015 and previous FINDINGS: Vascular: Left arm IV contrast injection. Innominate vein and SVC are patent. Right atrium and right ventricle are nondilated. Satisfactory opacification of pulmonary arteries noted, and there is no evidence of pulmonary emboli. Breathing motion degrades some of the images. Patent bilateral pulmonary veins drain into the left atrium. Mild left atrial enlargement. Scattered coronary calcifications. Adequate contrast opacification of the thoracic aorta with no evidence of dissection, aneurysm, or stenosis. There is bovine variant brachiocephalic arch anatomy without proximal stenosis. Descending segment is ectatic and atheromatous. Scattered calcifications throughout the aorta. Infrarenal segment not evaluated. Mediastinum/Lymph Nodes: Sub cm precarinal, AP window, and subcarinal lymph nodes. No pericardial effusion. Lungs/Pleura: Patchy airspace opacities peripherally in the right upper lobe, new since PET-CT. There is some atelectasis or infiltrate in the inferior and posterior aspect of the lingula and posteriorly in both lower lobes left greater than right. Motion degrades portions of examination. No pleural effusion. No pneumothorax. Upper abdomen: Cholelithiasis.  No acute abnormality. Musculoskeletal: Come T U/S compression deformities in the mid thoracic spine are stable since prior CT of 06/01/2015. No worrisome lytic or sclerotic bone lesion. Breathing motion degrades the coronal and sagittal reconstructions. Review of the MIP images confirms the above findings. IMPRESSION: 1. Negative for acute PE or thoracic aortic dissection. 2. New patchy airspace opacities in the right upper lobe, lingula, and posterior lung bases. 3. Atherosclerosis, including aortic hand coronary artery disease. Please note that although the presence of  coronary artery calcium documents the presence of coronary artery disease, the severity of this disease and any potential stenosis cannot be assessed on this non-gated CT examination. Assessment for potential risk factor modification, dietary therapy or pharmacologic therapy may be warranted, if clinically indicated. 4. Cholelithiasis. Electronically Signed   By: Lucrezia Europe M.D.   On: 07/15/2015 15:53   Nm Pet Image Restag (ps) Skull Base To Thigh  07/12/2015  CLINICAL DATA:  Subsequent Treatment strategy for squamous cell lung cancer. EXAM: NUCLEAR MEDICINE PET SKULL BASE TO THIGH TECHNIQUE: 8.1 mCi F-18 FDG was injected intravenously. Full-ring PET imaging was performed from the skull base to thigh after the radiotracer. CT data was obtained and used for attenuation correction and anatomic localization. FASTING BLOOD GLUCOSE:  Value: 109 mg/dl  COMPARISON:  03/13/2015 FINDINGS: NECK No hypermetabolic lymph nodes in the neck. CHEST Continued interval improvement in the lingular airspace disease noted on today's study. No evidence for hypermetabolic FDG accumulation within the residual trace lingular opacity. The solitary focus of hypermetabolic activity seen in the left hilum on the previous study has also resolved in the interval. There is no evidence for mediastinal or hilar lymphadenopathy on the uninfused CT images obtained for attenuation correction. ABDOMEN/PELVIS No abnormal hypermetabolic activity within the liver, pancreas, adrenal glands, or spleen. No hypermetabolic lymph nodes in the abdomen or pelvis. Focal hypermetabolism in the region of the anus again noted, stable in the interval. Calcified gallstones again noted. There is abdominal aortic atherosclerosis without aneurysm. SKELETON No focal hypermetabolic activity to suggest skeletal metastasis. IMPRESSION: 1. Continued further decrease in lingular opacity and no residual lingular hypermetabolism on today's exam. 2. Interval resolution of the  solitary hypermetabolic focus in the left hilum. No evidence for hypermetabolic metastatic disease in the mediastinum or left hilum on today's exam. 3. No evidence for hypermetabolic metastatic disease in the abdomen or pelvis. 4. Persistent hypermetabolism in the anus. This may be physiologic, but neoplastic involvement cannot be excluded. Electronically Signed   By: Misty Stanley M.D.   On: 07/12/2015 13:06     PERTINENT LAB RESULTS: CBC:  Recent Labs  07/16/15 0510 07/17/15 0504  WBC 8.0 8.3  HGB 8.8* 8.6*  HCT 30.4* 29.8*  PLT 185 195   CMET CMP     Component Value Date/Time   NA 140 07/17/2015 0504   NA 144 07/12/2015 0944   NA 145 04/26/2015   K 4.9 07/17/2015 0504   K 4.1 07/12/2015 0944   K 4.5 04/15/2015   CL 99* 07/17/2015 0504   CO2 36* 07/17/2015 0504   CO2 36* 07/12/2015 0944   GLUCOSE 164* 07/17/2015 0504   GLUCOSE 107 07/12/2015 0944   BUN 27* 07/17/2015 0504   BUN 18.3 07/12/2015 0944   BUN 15 04/26/2015   CREATININE 0.86 07/17/2015 0504   CREATININE 0.8 07/12/2015 0944   CREATININE 0.5 04/26/2015   CREATININE 0.71 06/15/2014 1008   CALCIUM 9.1 07/17/2015 0504   CALCIUM 8.5 07/12/2015 0944   PROT 6.7 07/15/2015 1240   PROT 5.4* 07/12/2015 0944   ALBUMIN 3.4* 07/15/2015 1240   ALBUMIN 2.9* 07/12/2015 0944   AST 14* 07/15/2015 1240   AST <7 07/12/2015 0944   ALT 11* 07/15/2015 1240   ALT <9 07/12/2015 0944   ALKPHOS 86 07/15/2015 1240   ALKPHOS 81 07/12/2015 0944   BILITOT 1.0 07/15/2015 1240   BILITOT 0.36 07/12/2015 0944   GFRNONAA >60 07/17/2015 0504   GFRNONAA 66 10/12/2013 1516   GFRAA >60 07/17/2015 0504   GFRAA 76 10/12/2013 1516    GFR Estimated Creatinine Clearance: 64 mL/min (by C-G formula based on Cr of 0.86). No results for input(s): LIPASE, AMYLASE in the last 72 hours. No results for input(s): CKTOTAL, CKMB, CKMBINDEX, TROPONINI in the last 72 hours. Invalid input(s): POCBNP No results for input(s): DDIMER in the last 72  hours. No results for input(s): HGBA1C in the last 72 hours. No results for input(s): CHOL, HDL, LDLCALC, TRIG, CHOLHDL, LDLDIRECT in the last 72 hours. No results for input(s): TSH, T4TOTAL, T3FREE, THYROIDAB in the last 72 hours.  Invalid input(s): FREET3 No results for input(s): VITAMINB12, FOLATE, FERRITIN, TIBC, IRON, RETICCTPCT in the last 72 hours. Coags: No results for input(s): INR in the last 72 hours.  Invalid input(s): PT  Microbiology: No results found for this or any previous visit (from the past 240 hour(s)).   BRIEF HOSPITAL COURSE:  Acute on chronic hypoxic respiratory failure: Secondary to CAP and COPD exacerbation. Much better, titrated back down to usual home O2 regimen. Managed with IV Abx, steroids and bronchodilators.  Active Problems: Acute exacerbation of chronic obstructive pulmonary disease:significantly improved, no  rhonchi on exam this morning. Appears comfortable and is requesting discharge today. Taper steroids, continue bronchodilators as previous. Stable for discharge today.  PNA: continues to cough (chronic issue)-but appears non acute. Is afebrile and without leukocytosis. Continue Levaquin for 2 more days to complete a 5 day course. Influenza PCR negative (reports multiple family members with flu like symptoms)  Chronic Cough: claims to have significant but chronic allergic rhinitis/sinusitis-causing her to have cough.Continue antihistamine and her usual regimen on discharge. She is apparently intolerant to all kinds of nasal inhalers.   Anemia:secondary to chronic disease. No evidence of overt blood loss.Follow Hb closely as outpatient.  Stage IIB non-small cell lung cancer:Status post XRT in 3/1. Seen by Dr Julien Nordmann on 6/6-who discussed findings of recent PET scan, which showed no concerning finding for disease recurrence. Continue follow up as outpatient with Dr. Earlie Server and [name]  Paroxysmal atrial fibrillation:rate controlled with cardizem and  metoprolol, has refused anticoagulation in the past, per cardiology notes (Dr Johnsie Cancel 06/25/14). chadsvasc score is 4. Continue ASA.  Chronic diastolic CHF: compensated, continue Lasix.TTE 03/07/14-EF 50-55%  Anxiety/depression/chronic pain:Appears stable-Continue home regimen of Prozac/Busoar/Valium/hydrocodone   TODAY-DAY OF DISCHARGE:  Subjective:   Chandrika Sandles today has no headache,no chest abdominal pain,no new weakness tingling or numbness, feels much better wants to go home today.   Objective:   Blood pressure 98/85, pulse 87, temperature 98 F (36.7 C), temperature source Oral, resp. rate 22, height '5\' 5"'$  (1.651 m), weight 76.6 kg (168 lb 14 oz), SpO2 90 %.  Intake/Output Summary (Last 24 hours) at 07/18/15 0824 Last data filed at 07/18/15 0557  Gross per 24 hour  Intake    720 ml  Output   1125 ml  Net   -405 ml   Filed Weights   07/15/15 1852 07/17/15 0512 07/18/15 0553  Weight: 74.707 kg (164 lb 11.2 oz) 75.5 kg (166 lb 7.2 oz) 76.6 kg (168 lb 14 oz)    Exam Awake Alert, Oriented *3, No new F.N deficits, Normal affect [place].AT,PERRAL Supple Neck,No JVD, No cervical lymphadenopathy appriciated.  Symmetrical Chest wall movement, Good air movement bilaterally, CTAB RRR,No Gallops,Rubs or new Murmurs, No Parasternal Heave +ve B.Sounds, Abd Soft, Non tender, No organomegaly appriciated, No rebound -guarding or rigidity. No Cyanosis, Clubbing or edema, No new Rash or bruise  DISCHARGE CONDITION: Stable  DISPOSITION: Home  DISCHARGE INSTRUCTIONS:    Activity:  As tolerated with Full fall precautions use walker/cane & assistance as needed  Get Medicines reviewed and adjusted: Please take all your medications with you for your next visit with your Primary MD  Please request your Primary MD to go over all hospital tests and procedure/radiological results at the follow up, please ask your Primary MD to get all Hospital records sent to his/her office.  If you experience  worsening of your admission symptoms, develop shortness of breath, life threatening emergency, suicidal or homicidal thoughts you must seek medical attention immediately by calling 911 or calling your MD immediately  if symptoms less severe.  You must read complete instructions/literature along with all the possible adverse reactions/side effects for all the Medicines you  take and that have been prescribed to you. Take any new Medicines after you have completely understood and accpet all the possible adverse reactions/side effects.   Do not drive when taking Pain medications.   Do not take more than prescribed Pain, Sleep and Anxiety Medications  Special Instructions: If you have smoked or chewed Tobacco  in the last 2 yrs please stop smoking, stop any regular Alcohol  and or any Recreational drug use.  Wear Seat belts while driving.  Please note  You were cared for by a hospitalist during your hospital stay. Once you are discharged, your primary care physician will handle any further medical issues. Please note that NO REFILLS for any discharge medications will be authorized once you are discharged, as it is imperative that you return to your primary care physician (or establish a relationship with a primary care physician if you do not have one) for your aftercare needs so that they can reassess your need for medications and monitor your lab values.   Diet recommendation: Heart Healthy diet   Discharge Instructions    Call MD for:  difficulty breathing, headache or visual disturbances    Complete by:  As directed      Call MD for:  persistant nausea and vomiting    Complete by:  As directed      Call MD for:  redness, tenderness, or signs of infection (pain, swelling, redness, odor or green/yellow discharge around incision site)    Complete by:  As directed      Diet - low sodium heart healthy    Complete by:  As directed      Increase activity slowly    Complete by:  As directed              Follow-up Information    Follow up with Darden Amber, PA. Schedule an appointment as soon as possible for a visit in 1 week.   Contact information:   [place] [address] [phone]       Please follow up.   Why:  Hospital follow up     Total Time spent on discharge equals  45 minutes.  SignedOren Binet 07/18/2015 8:24 AM

## 2015-07-18 NOTE — Progress Notes (Signed)
Pt discharged from [facility] with all belongings, discharge instructions, follow up appointments discussed with patient and patient verbalized understanding. 2 prescriptions given to patient. Patient taken via wheelchair with oxygen to POV in care of daughter who will take her home with personal O2 tank. Pt states she has spoken to home health RN, has her home health needs in place and has oxygen at home. VSS. No distress.

## 2015-07-19 ENCOUNTER — Ambulatory Visit: Payer: Medicare Other | Admitting: Adult Health

## 2015-07-22 ENCOUNTER — Telehealth: Payer: Self-pay | Admitting: *Deleted

## 2015-07-22 ENCOUNTER — Ambulatory Visit (INDEPENDENT_AMBULATORY_CARE_PROVIDER_SITE_OTHER): Payer: Medicare Other | Admitting: Adult Health

## 2015-07-22 ENCOUNTER — Telehealth: Payer: Self-pay | Admitting: Internal Medicine

## 2015-07-22 ENCOUNTER — Encounter: Payer: Self-pay | Admitting: Adult Health

## 2015-07-22 VITALS — BP 132/70 | HR 76 | Temp 98.7°F | Ht 65.0 in | Wt 166.0 lb

## 2015-07-22 DIAGNOSIS — J9611 Chronic respiratory failure with hypoxia: Secondary | ICD-10-CM

## 2015-07-22 DIAGNOSIS — F419 Anxiety disorder, unspecified: Secondary | ICD-10-CM | POA: Diagnosis not present

## 2015-07-22 DIAGNOSIS — J441 Chronic obstructive pulmonary disease with (acute) exacerbation: Secondary | ICD-10-CM | POA: Diagnosis not present

## 2015-07-22 MED ORDER — PREDNISONE 10 MG PO TABS: 10.0000 mg | ORAL_TABLET | Freq: Every day | ORAL | Status: DC

## 2015-07-22 NOTE — Telephone Encounter (Signed)
Called c. Scheduling.. Cancelled CT Chest per pof

## 2015-07-22 NOTE — Telephone Encounter (Signed)
Pt daughter Levada Dy called regarding upcoming scan 6/13. Pt has CT ANGIO CHEST while inpatient. Reviewed with MD -cancelled per MD. Lynita Lombard I will inform scheduling to cancel 6/13 scan appt.

## 2015-07-22 NOTE — Patient Instructions (Addendum)
Decrease Prednisone '40mg'$  daily for 1 day starting tomorrow, then '20mg'$  daily for 1 week and then '10mg'$  daily -hold at this dose.  Continue on Symbicort and Spiriva .  Continue on Albuterol Neb As needed  .  Need to make appointment with Psychiatry as discussed.  Mucinex DM Twice daily  As needed  Cough/congestion  Continue on Oxygen 2l/m -continuous flow only.  Follow up in 2 weeks with chest xray and As needed  -[name].  Please contact office for sooner follow up if symptoms do not improve or worsen or seek emergency care

## 2015-07-23 ENCOUNTER — Ambulatory Visit (HOSPITAL_COMMUNITY): Payer: Medicare Other

## 2015-07-24 ENCOUNTER — Encounter: Payer: Self-pay | Admitting: Adult Health

## 2015-07-24 ENCOUNTER — Ambulatory Visit (HOSPITAL_BASED_OUTPATIENT_CLINIC_OR_DEPARTMENT_OTHER): Payer: Medicare Other | Admitting: Adult Health

## 2015-07-24 VITALS — BP 103/57 | HR 89 | Temp 98.8°F | Resp 20 | Wt 170.0 lb

## 2015-07-24 DIAGNOSIS — R5383 Other fatigue: Secondary | ICD-10-CM | POA: Diagnosis not present

## 2015-07-24 DIAGNOSIS — F4323 Adjustment disorder with mixed anxiety and depressed mood: Secondary | ICD-10-CM | POA: Diagnosis not present

## 2015-07-24 DIAGNOSIS — M810 Age-related osteoporosis without current pathological fracture: Secondary | ICD-10-CM

## 2015-07-24 DIAGNOSIS — C3412 Malignant neoplasm of upper lobe, left bronchus or lung: Secondary | ICD-10-CM

## 2015-07-24 DIAGNOSIS — C3492 Malignant neoplasm of unspecified part of left bronchus or lung: Secondary | ICD-10-CM

## 2015-07-24 DIAGNOSIS — Z1231 Encounter for screening mammogram for malignant neoplasm of breast: Secondary | ICD-10-CM

## 2015-07-24 DIAGNOSIS — J449 Chronic obstructive pulmonary disease, unspecified: Secondary | ICD-10-CM

## 2015-07-24 DIAGNOSIS — M8580 Other specified disorders of bone density and structure, unspecified site: Secondary | ICD-10-CM | POA: Diagnosis not present

## 2015-07-24 DIAGNOSIS — Z1211 Encounter for screening for malignant neoplasm of colon: Secondary | ICD-10-CM

## 2015-07-24 DIAGNOSIS — C3410 Malignant neoplasm of upper lobe, unspecified bronchus or lung: Secondary | ICD-10-CM

## 2015-07-24 DIAGNOSIS — Z87891 Personal history of nicotine dependence: Secondary | ICD-10-CM | POA: Diagnosis not present

## 2015-07-24 NOTE — Progress Notes (Signed)
Lung Cancer Treatment Summary & Survivorship Care Plan Provided by Mike Craze, NP on 07/24/2015   General Information  Patient Name Kelsey Garcia   Patient ID [id number]   Date of Birth Feb 28, 1945     Your Care Team  Patient Care Team: Darden Amber, [place] as PCP - [place], LCSW as [place], MD - Pulmonologist Curt Bears, MD - Medical Oncologist Eppie Gibson, MD - Radiation Oncologist Harlow Asa, RN - Radiation Oncology Nurse Mike Craze, NP - Survivorship Nurse Practitioner   To reach your [facility] providers, call [phone].   Cancer Diagnosis Information  Diagnosis Non-Small Cell Lung Cancer  Tumor Location Left upper lobe (lingula of lung)   Diagnosis Date 02/27/15  Histology  Squamous cell  Staging  Cancer of lingula of lung ([place])   Staging form: Lung, AJCC 7th Edition    Clinical stage from 06/11/2015: Stage IIB (T3, N0, M0)   Family History Family History  Problem Relation Age of Onset  . Asthma    . Emphysema    . Allergies    . Cancer      aunt had several types of cancer  . COPD Mother   . Emphysema Mother   . Cirrhosis Father     Smoking Status Former smoker; quit 12/10/2014    Treatment Summary    Cancer of lingula of lung ([place])   01/21/2015 Imaging CXR: Lingular airspace opacity concerning for pneumonia. Follow-up PA/lat CXR recommended in 3-4 weeks to ensure resolution and exclude underlying malignancy.    01/30/2015 Imaging CXR: Persistent lingular infiltrate. No new opacity. No change in cardiac silhouette.    01/30/2015 Imaging CTA chest: No evidence of PE. Lingular infiltrate similar to that seen on recent plain film. Changes in prior granulomatous disease. Increasing compression deformities at T5 & T6.    02/23/2015 - 03/05/2015 Hospital Admission Admission for acute on chronic respiratory failure.    02/23/2015 Imaging CXR: Persistent infiltrate in superior  lingula. No new opacity.    02/25/2015 Imaging CT chest: Stable appearance of confluent consolidation in lingula. Given persistence, neoplasm should be considered.  Compression fractures at T5 & T6.    02/27/2015 Procedure Lingular lobe lung biopsy.  Benign lung tissue, no tumor seen. Lingular lobe bronchial brushing-Cytology (+) for malignant cells c/w squamous cell carcinoma.    02/28/2015 Imaging MRI brain: Negative for metastatic disease. Mild chronic microvascular ischemic change in white matter and pons. Image quality degraded by motion.    02/28/2015 Imaging CT a/p: No evidence of intra-abdominal or pelvic metastatic disease.    03/04/2015 Initial Diagnosis Cancer of lingula of lung ([place])   03/13/2015 PET scan Interval improvement in lingular airspace disease, post-obstructive pneumonitis. Small solitary focus in (L) hilum. No other metastatic disease. Hypermetabolic assymetric soft tissue prominence at anorectal junction   03/25/2015 - 04/15/2015 Radiation Therapy SBRT lung Isidore Moos). Left hilum / 50 Gy in 10 fractions   03/28/2015 Imaging CT head: No acute intracranial findings to explain pt's AMS and confusion. Evidence of chronic ischemic microvascular white matter disease involving periventricular white matter & pons, not appreciably changed from prior exams.    03/30/2015 - 04/02/2015 Hospital Admission Admitted for SOB, fatigue, and A-fib with RVR   03/30/2015 Imaging CTA chest: No PE. Soft tissue thickening in (L) hilum, causing concentric luminal narrowing involving proximal LUL & lingular bronchus. Mild improvement in lingular atelectasis. Stable mildly prominent precarinal LN without hypermetabolism   04/04/2015 -  04/07/2015 Hospital Admission Admitted for acute exacerbation of COPD. Discharged to SNF for short-term rehab & palliative care to follow at facility as well.    06/01/2015 Imaging CXR: Interval development of LUL collapse. Obstruction of LUL bronchi vs. recurrent neoplastic disease with  central obstructing mass. Stable cardiomegaly.    06/01/2015 Imaging CT chest: Changes c/w LUL collapse. New, somewhat nodular changes, in RUL with interstitial infiltrative changes; may represent acute superimposed pneumonic infiltrate.    07/12/2015 PET scan Continued further decrease in lingular opacity & no residual hypermetabolic activity. Intervnal resolution of solitary activity in (L) hilum. No metastatic disease noted in mediastinum, (L) hilum, abdomen or pelvis. Persistent hypermetabolism in anus.   07/15/2015 - 07/18/2015 Hospital Admission Admitted for acute exacerbation of COPD   07/15/2015 Imaging CTA chest: No PE. New patchy airspace opacities in RUL, lingula, & posterior lung bases.     07/15/2015 Imaging CXR: Mild left basilar atelectasis or pneumonia. Stable cardiomegaly, pulmonary vascular congestion and interstitial lung disease.        Treatment Goal Curative        Your treatment included radiation therapy.                 Radiation Therapy: Type of Radiation Stereotactic Body Radiation Therapy (SBRT)  Location of Radiation Left hilum (chest)  Radiation Period Start Date: 03/25/15                  End Date: 04/15/15  Number of Radiation Treatments 10 treatments   Total Radiation Dose 50 Gy    Side Effects of Radiation  Possible Long-Term Fatigue Skin irritation/discoloration Hair loss in treated areas  Possible Late-Term (five or more years after treatment)  Skin changes (including discoloration to the treated area) Lung damage/irritation Formation of scar tissue Rib damage (rare) Heart irritation or damage (for left-sided tumors) Damage to any normal tissues in the irradiated field Secondary cancers (rare) New primary cancers (rare)   Whether you experience late side effects will depend on: The part of your body that was treated The dose and length of your radiation therapy And if you received chemotherapy before, during, or after radiation  therapy .                  Survivorship Care & Follow-up Schedule   Becoming a cancer survivor is a time of great celebration, but also a time of many questions and concerns.  The following information and resources are for you, your caregivers, and your primary care doctor to better understand the needs of a cancer survivor.     How often will I see my cancer providers: Survivor Years 0-2  (Survivor Years defined by length of time since cancer diagnosis)  When?  Responsible Provider  Radiation Oncology visits Every 6 months Dr. Isidore Moos  CT scan of chest Every 6 months Dr. Isidore Moos   Survivorship Nurse Practitioner (NP) After completion of treatment  Mike Craze, NP   How often will I see my cancer providers: Survivor Years 3-5+ (Survivor Years defined by length of time since cancer diagnosis)  When?  Responsible Provider  Radiation Oncology visits Annually Dr. Isidore Moos  CT scan of chest  Annually Dr. Isidore Moos or Mike Craze, NP  Survivorship Nurse Practitioner (NP) Annually when your doctor refers you to survivorship as a long-term survivor! Mike Craze, NP                        Screening Recommendations Get  regular screenings, as indicated by your Primary Care Provider   Frequency Responsible Provider  Annual Physical By your Primary Care Provider Should include skin examination Annually Discuss with Roosevelt Locks, PA  Colonoscopy Beginning at age 60 unless clinically indicated to begin sooner Every 10 Years Discuss with Roosevelt Locks, PA  Pap Smear (Women) Frequency to be discussed and determined by Primary Care Provider or Gynecology Specialist Discuss with Roosevelt Locks, PA Discuss with Roosevelt Locks, PA or Gynecology Specialist  Vaccinations (Flu, Shingles, Pneumonia, TDaP, etc.) Discuss with Roosevelt Locks, PA  Discuss with Roosevelt Locks, PA       Common Complaints/Concerns of Cancer Survivors  *Patients often worry if what they are  experiencing is normal, or to be expected, given their cancer history and treatment.  Below are common complaints & concerns reported by cancer survivors. If you are concerned about symptoms you are experiencing, please report all symptoms to your cancer care team!   Common Complaints/Concerns for Lung Cancer Survivors   Fatigue  Dry cough  Memory problems and/or confusion  Depression  Anxiety  Weight changes  Insomnia or trouble sleeping  Decreased range of motion (difficulty moving neck)  Intimacy and sexuality  Marital/partner/family relationships   Employment, health insurance concerns, and/or finances  Concerns regarding spirituality  Exercise after cancer treatments  Maintaining a tobacco-free lifestyle              Symptoms to Watch For and Report to Your Provider   *Often cancer survivors are fearful about their cancer coming back or being diagnosed with a new cancer.  Below are symptoms that you and your loved ones should watch for and report to your provider.   General Symptoms to Watch for and Report to Your Provider .  Return of the cancer symptoms you had before- such as a lump or new growth where your cancer first started . New or unusual pain that seems unrelated to an injury and does not go away, including back pain or bone pain . Weight loss without trying/intending . Unexplained bleeding . A rash or allergic reaction, such as swelling, severe itching or wheezing . Chills or fevers . Persistent headaches . Shortness of breath or difficulty breathing . Bloody stools or blood in your urine . Lumps, bumps, swelling and/or nipple discharge . Nausea, vomiting, diarrhea, loss of appetite, or trouble swallowing . A cough that doesn't go away . Abdominal pain . Swelling in your arms or legs . Fractures . Any other signs mentioned by your doctor or nurse or any unusual symptoms                 that you just can't explain   NOTE: Just because  you have certain symptoms, it doesn't mean the cancer has come back or you have a new cancer. Symptoms can be due to other problems that need to be addressed.  It is important to watch for these symptoms and report them to your provider so you can be medically evaluated for any of these concerns!                     What Now?  Thriving & Surviving In Your Life After Cancer   Stop smoking or continue to abstain from tobacco use. If you need help with smoking cessation, talk to your healthcare team about different options to help you!  Consider participating in "Finding Your New Normal" [facility]) survivorship series or other support groups through our Patient &  [place].   [employer] fitness program for cancer survivors.   Consider FREE counseling at cancer center.  For more information, contact Ottis Stain at [phone].  Keep your follow-up appointments with all of your specialists (Cancer doctors, Pulmonary doctors, Primary Care Provider, Dietician, Physical Therapist, etc.)  Limit alcohol consumption or abstain from consuming alcohol all together.   Maintain adequate nutrition with plenty of fresh fruits & vegetables.       Thank you so much for allowing [facility] to care for you during your cancer experience.  Our continued commitment is to you and your caregiver(s) health and happiness and again, Congratulations!     Mike Craze, NP Survivorship Program [facility] [phone]                ~For additional information, see attached resources.   Additional Resources for Lung Cancer Survivors  Survivors of cancer often report some of the following needs or concerns after they have completed treatment.  Below are some suggested interventions and resources to help guide you.  Just because your cancer treatment has ended, it does not mean that we stop helping you manage your needs or concerns.   Please let us know how we can best help you in your new life post-cancer and your return to health and wellness!       Common Needs/Concerns Suggested intervention(s)  Fatigue . This is the most common symptom experienced by lung cancer survivors. You are not alone!  . Regular physical activity - walking 20 minutes daily . Evaluation for hypothyroidism, anemia, sleep disturbance, or depression . For more information, visit the National Cancer Institute's (NCI) website: http://www.kaiser.com/   Shortness of breath . Very common after lung cancer surgery due to reduced lung volume, as well as decreased physical activity. . Referral to Physical Therapy for improved endurance and stamina.  [place] booklet   Chronic cough or changes in your voice . The cough could be related to other airway diseases, like asthma or COPD, and those conditions should be treated to help with the cough.  . Could be related to acid reflux from the stomach.  You may need a medication to help with the acid which will help the cough.  . Over-the-counter cough suppressants can offer some relief.   . Sometimes, a short course of oral steroids can help suppress a cough.  Ask your doctor which treatment option may be best for you.   Chronic pain . About 50% of lung cancer survivors will experience some type of chronic pain, particularly after thoracotomy surgery.  . Communicate honestly with your doctors and providers about your pain so that we may help make the best recommendations for you.   Smoking cessation and/or maintaining your tobacco-free lifestyle . Referral for Smoking Cessation Counseling . Support Groups and Counseling . Medications to help with nicotine withdrawal symptoms. . Consider acupuncture or hypnosis.  . For more information and valuable resources, visit: smokefree.gov   Memory problems and/or confusion . About 25% of cancer patients have  some degree of cognitive dysfunction (meaning memory problems, trouble concentrating, trouble finding the right word, etc.) after treatment.  This usually gets better over time.  . Some patients may need evaluation for sleep disturbances contributing to memory problems or depression.  . For more information, visit NCI's website at: ForwardDrop.tn   Change in hearing . Certain chemotherapy drugs can cause hearing loss.  Most patients improve with time.  Marland Kitchen  Referral to Audiologist for hearing evaluation and recommendations.   Peripheral neuropathy . Some chemotherapy drugs can cause numbness/tingling in the hands and feet.  This usually improves with time and when chemotherapy is completed.  However, some patients may experience symptoms long after their chemotherapy has finished.  . Gabapentin, a prescription medication, may help.  . Certain antidepressant drugs have been shown to help with neuropathy pain.   . Over-the-counter capsaicin cream may help temporarily decrease the sensation of pain.   . Very rarely are strong pain medications needed for treatment of neuropathy.  . Visit NCI's website for more information: http://www.taylor.net/   Depression/General Anxiety  Consider counseling and/or initiation of pharmacotherapy  Referral to Social Worker   Referral to [place]. Call [phone] for details.  Finding Your New Normal [facility]) class through the [place].  Call [phone] for details.  Other resources: Nurse, learning disability.org, Call the [place] at [phone] for additional resources.  Insomnia or trouble sleeping  Practice good sleep habits  Discuss treatments of hot flashes  Consider treatment for anxiety  Read this from the [place] http://www.cancer.org/treatment/treatmentsandsideeffects/physicalsideeffects/fatigue/fatigueinpeoplewithcancer/fatigue-in-people-with-cancer-treating-fatigue  Weight loss/Loss of appetite  Dietary counseling with a Registered Dietician  Attend a healthy cooking class at [facility]. Call [phone] for details.  If your weight loss has been significant (greater than 15 pounds in 6 months or less), then call your doctor.  You need to be seen and evaluated.   Appetite stimulant medications can help improve appetite and help you gain weight.   Eat high-calorie foods as often as you can tolerate.   Weight gain or overweight  ( BMI over 25.0 m  or weight gain of >15 lbs)   Dietary counseling with a Registered Dietician  Attend a healthy cooking class at [facility]. Call [phone] for details.  Referral to a weight management support group (e.g. Weight Watchers, Overeaters Anonymous)  Change in your eating habits, your taste, or your smell . Choose foods with tart flavors like lemon wedges, lemonade, citrus fruits, vinegar, and pickled foods.  . Add sweeteners or a little bit of sugar to foods. A little sweetness can help increase pleasant tastes. . Season foods with herbs/spices/or other seasonings like onion, garlic, chili powder, barbecue sauce, mustard, ketchup, mint, etc.  . If meats taste strange, marinate or cook meats in sweet juices, fruits, acidic dressings, or wine.  . If certain foods or drinks smell unpleasant while cooking, choose foods that do not need to be cooked, like cold sandwiches, crackers and cheese, yogurt and fruit, or cold cereal.  . Keep your mouth clean and healthy by rinsing and brushing your teeth after meals and before bed.   Decreased range of motion   Referral to Physical Therapy  Consider taking yoga or Tai Chi classes at the [facility].  Call [phone] for a schedule.  Intimacy and sexuality  Evaluation and treatment for anxiety, depression, as  appropriate  Address body image concerns  Attend the American Cancer Society's Look Good.Feel Better program. Call [phone] for a schedule of when this event is offered.   Try a vaginal lubricant (Astroglide) or moisturizer (Replens- 1x every 3 days)  Consider attending support groups at [facility]. Call [phone] for details.  Attend the Finding Your New Normal [facility]) class at [facility]. Call [phone] for details.  Marital/partner/family relationships  Referral to Forensic psychologist Groups at the [employer]   Finding Your New Normal [facility]) class through the Cancer  Center. Call [phone] for details.  Stigma of lung cancer diagnosis   Do not blame yourself for having cancer!  If you are experiencing anxiety or depression as a result of what others think about your diagnosis, then let us help you!  Referral to Education officer, museum or Counselor  Referral to [employer], health insurance, and/or finances  Referral to Education officer, museum  Concerns regarding spirituality, faith, coping, relating to God, loss of faith, facing my mortality, and/or loss of my sense of purpose  Referral to Chaplain   Referral to [employer] activities: www.hirschwellnessnetwork.org  Support Groups at the [place]. Call [phone] for details.  Finding Your New Normal [facility]) class through [facility].  Call [phone] for details.  Returning to an exercise program  Consider joining the [facility], a 12-week program that meets twice a week for 90 minutes using traditional exercise methods to ease you back into fitness and help you maintain a healthy weight.   This program is FREE for you and a friend.  Offered at several local [place].    Contact Elaina Hoops at [phone] or [email or web address]  Consider joining [place] or Yoga at [facility]. Call [phone] for a schedule.
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
## 2015-07-25 ENCOUNTER — Encounter: Payer: Self-pay | Admitting: Internal Medicine

## 2015-07-25 NOTE — Assessment & Plan Note (Signed)
Continue on Oxygen 2l/m -continuous flow only.  Follow up in 2 weeks with chest xray and As needed  -[name].  Please contact office for sooner follow up if symptoms do not improve or worsen or seek emergency care

## 2015-07-25 NOTE — Assessment & Plan Note (Signed)
Slow to resolve flare with probable steroid side effects.  Will taper prednisone off quickly if able   Plan Decrease Prednisone '40mg'$  daily for 1 day starting tomorrow, then '20mg'$  daily for 1 week and then '10mg'$  daily -hold at this dose.  Continue on Symbicort and Spiriva .  Continue on Albuterol Neb As needed  .  Need to make appointment with Psychiatry as discussed.  Mucinex DM Twice daily  As needed  Cough/congestion  Continue on Oxygen 2l/m -continuous flow only.  Follow up in 2 weeks with chest xray and As needed  -[name].  Please contact office for sooner follow up if symptoms do not improve or worsen or seek emergency care

## 2015-07-25 NOTE — Assessment & Plan Note (Signed)
?   Flare from steroids and illness   Plan  Need to make appointment with Psychiatry as discussed.  Please contact office for sooner follow up if symptoms do not improve or worsen or seek emergency care

## 2015-07-25 NOTE — Progress Notes (Signed)
   Subjective:    Patient ID: Kelsey Garcia, female    DOB: 1945/10/07, 70 y.o.   MRN: [id number]  [place], smoker presents for FU of COPD.  She has a h/o opiate abuse in the 30's. She is not prescribed codeine containing medications due to her addiction hisotry.     07/22/15 Follow up : COPD / O2 dependent RF  Returns for 1  month follow up .  Had recent COPD flare , tx abx and steroids , was admitted last week. CT chest showed new RUL and lingular aspdz ? pna . She is on prednisone taper , currently '40mg'$  daily .  Says she is feeling better but still gets very winded with activity, has no energy , coughing up thick mucus, varies in color from white to green at times. Intermittent wheezing but is decreased.  She says the prednisone is driving her crazy, she is very irritable and has mood swings.  She is having hard time managing her stress, she is planning on making appointment with Psych. No suicidal ideations.   Symbicort and Spiriva.  Remains on Oxygen 2l/m . Uses 3 l/m on pulsing concentrator.  She denies any hemoptysis, orthopnea, PND, or increased leg swelling.   Has  NSCLC (Squamous cell) s/t XRT  Last tx 04/15/15 .   PET scan in 03/13/15 showed Interval improvement in lingular with low-grade metabolic activity. Felt likely due to improving postobstructive pneumonitis. Small solitary focus of hypermetabolic activity in the left hilum. Other metastatic disease noted. CT chest in February showed soft tissue thickening in the left hilum with moderate to marked good luminal narrowing involving the proximal left upper lobe bronchus and moderate narrowing in the proximal lingular bronchus. There is improvement in lingular atelectasis., PET scan 07/12/15 showed further decrease in lingular opacity w/ no residual hypermetabolic act. Resolved left hilum nodule .   Significant tests/ events PFT's 10/2012 show FEV1/FVC = 71% predicted, FEV1 = 49% predicted, DLCO 39% predicted CT 06/2014 no ILD, resolved  nodular ASD CXR 07/2014 int prominence  Review of Systems neg for any significant sore throat, dysphagia, itching, sneezing,   fever, chills, sweats, unintended wt loss, pleuritic or exertional cp, hempoptysis, orthopnea pnd or change in chronic leg swelling. Also denies presyncope, palpitations, heartburn, abdominal pain, nausea, vomiting, diarrhea or change in bowel or urinary habits, dysuria,hematuria, rash, arthralgias, visual complaints, headache, numbness weakness or ataxia.     Objective:   Physical Exam Filed Vitals:   07/22/15 0950  BP: 132/70  Pulse: 76  Temp: 98.7 F (37.1 C)  TempSrc: Oral  Height: '5\' 5"'$  (1.651 m)  Weight: 166 lb (75.297 kg)  SpO2: 93%     Gen.well-nourished, in no distress, normal affect, chronically ill appearing on o2 in wc.  ENT - no lesions, no post nasal drip Neck: No JVD, no thyromegaly, no carotid bruits Lungs: no use of accessory muscles, no dullness to percussion, decreased bs in bases, no wheezing ,  Cardiovascular: Rhythm regular, heart sounds  normal, no murmurs or gallops, trace peripheral edema Abdomen: soft and non-tender, no hepatosplenomegaly, BS normal. Musculoskeletal: No deformities, no cyanosis or clubbing Neuro:  alert, non focal  Clara Herbison NP-C  [facility]  07/25/2015      Assessment & Plan:

## 2015-07-26 ENCOUNTER — Telehealth: Payer: Self-pay | Admitting: *Deleted

## 2015-07-26 NOTE — Telephone Encounter (Signed)
Called patient to inform of mammogram and bone density on 08-09-15- arrival time - 1:45 pm @ [facility], and her appt. With Dr. Hilarie Fredrickson of  GI on 09/26/15 and her CT on 01/23/16 @ [facility] and her fu with Dr. Isidore Moos on 01-24-16 @ 2:20 pm with Dr. Isidore Moos, spoke with patient's daughter , Levada Dy and she is aware of these appts.

## 2015-07-30 ENCOUNTER — Other Ambulatory Visit: Payer: Self-pay

## 2015-07-30 ENCOUNTER — Emergency Department (HOSPITAL_COMMUNITY): Payer: Medicare Other

## 2015-07-30 ENCOUNTER — Inpatient Hospital Stay (HOSPITAL_COMMUNITY)
Admission: EM | Admit: 2015-07-30 | Discharge: 2015-08-09 | DRG: 207 | Disposition: A | Discharge status: Rehabilitation Facility | Payer: Medicare Other | Attending: Pulmonary Disease | Admitting: Pulmonary Disease

## 2015-07-30 ENCOUNTER — Inpatient Hospital Stay (HOSPITAL_COMMUNITY): Payer: Medicare Other

## 2015-07-30 ENCOUNTER — Encounter (HOSPITAL_COMMUNITY): Payer: Self-pay | Admitting: Emergency Medicine

## 2015-07-30 DIAGNOSIS — J189 Pneumonia, unspecified organism: Secondary | ICD-10-CM | POA: Diagnosis present

## 2015-07-30 DIAGNOSIS — E872 Acidosis: Secondary | ICD-10-CM | POA: Diagnosis not present

## 2015-07-30 DIAGNOSIS — F17200 Nicotine dependence, unspecified, uncomplicated: Secondary | ICD-10-CM

## 2015-07-30 DIAGNOSIS — Z825 Family history of asthma and other chronic lower respiratory diseases: Secondary | ICD-10-CM

## 2015-07-30 DIAGNOSIS — K5901 Slow transit constipation: Secondary | ICD-10-CM | POA: Diagnosis not present

## 2015-07-30 DIAGNOSIS — F410 Panic disorder [episodic paroxysmal anxiety] without agoraphobia: Secondary | ICD-10-CM | POA: Diagnosis present

## 2015-07-30 DIAGNOSIS — I5033 Acute on chronic diastolic (congestive) heart failure: Secondary | ICD-10-CM

## 2015-07-30 DIAGNOSIS — N179 Acute kidney failure, unspecified: Secondary | ICD-10-CM | POA: Insufficient documentation

## 2015-07-30 DIAGNOSIS — D61818 Other pancytopenia: Secondary | ICD-10-CM

## 2015-07-30 DIAGNOSIS — D696 Thrombocytopenia, unspecified: Secondary | ICD-10-CM | POA: Diagnosis present

## 2015-07-30 DIAGNOSIS — R451 Restlessness and agitation: Secondary | ICD-10-CM | POA: Diagnosis present

## 2015-07-30 DIAGNOSIS — F418 Other specified anxiety disorders: Secondary | ICD-10-CM

## 2015-07-30 DIAGNOSIS — K219 Gastro-esophageal reflux disease without esophagitis: Secondary | ICD-10-CM | POA: Diagnosis not present

## 2015-07-30 DIAGNOSIS — F1120 Opioid dependence, uncomplicated: Secondary | ICD-10-CM | POA: Diagnosis not present

## 2015-07-30 DIAGNOSIS — T17998A Other foreign object in respiratory tract, part unspecified causing other injury, initial encounter: Secondary | ICD-10-CM | POA: Diagnosis not present

## 2015-07-30 DIAGNOSIS — Z9981 Dependence on supplemental oxygen: Secondary | ICD-10-CM | POA: Diagnosis not present

## 2015-07-30 DIAGNOSIS — I959 Hypotension, unspecified: Secondary | ICD-10-CM | POA: Diagnosis present

## 2015-07-30 DIAGNOSIS — T380X5A Adverse effect of glucocorticoids and synthetic analogues, initial encounter: Secondary | ICD-10-CM | POA: Diagnosis not present

## 2015-07-30 DIAGNOSIS — Z7189 Other specified counseling: Secondary | ICD-10-CM | POA: Diagnosis not present

## 2015-07-30 DIAGNOSIS — J9601 Acute respiratory failure with hypoxia: Secondary | ICD-10-CM | POA: Diagnosis not present

## 2015-07-30 DIAGNOSIS — Z9889 Other specified postprocedural states: Secondary | ICD-10-CM

## 2015-07-30 DIAGNOSIS — J9690 Respiratory failure, unspecified, unspecified whether with hypoxia or hypercapnia: Secondary | ICD-10-CM

## 2015-07-30 DIAGNOSIS — R1310 Dysphagia, unspecified: Secondary | ICD-10-CM

## 2015-07-30 DIAGNOSIS — Y95 Nosocomial condition: Secondary | ICD-10-CM | POA: Diagnosis present

## 2015-07-30 DIAGNOSIS — F419 Anxiety disorder, unspecified: Secondary | ICD-10-CM

## 2015-07-30 DIAGNOSIS — F3342 Major depressive disorder, recurrent, in full remission: Secondary | ICD-10-CM

## 2015-07-30 DIAGNOSIS — Z7409 Other reduced mobility: Secondary | ICD-10-CM

## 2015-07-30 DIAGNOSIS — I428 Other cardiomyopathies: Secondary | ICD-10-CM | POA: Diagnosis present

## 2015-07-30 DIAGNOSIS — E785 Hyperlipidemia, unspecified: Secondary | ICD-10-CM

## 2015-07-30 DIAGNOSIS — R05 Cough: Secondary | ICD-10-CM

## 2015-07-30 DIAGNOSIS — Z66 Do not resuscitate: Secondary | ICD-10-CM | POA: Diagnosis present

## 2015-07-30 DIAGNOSIS — Z79891 Long term (current) use of opiate analgesic: Secondary | ICD-10-CM

## 2015-07-30 DIAGNOSIS — G9340 Encephalopathy, unspecified: Secondary | ICD-10-CM | POA: Diagnosis not present

## 2015-07-30 DIAGNOSIS — I110 Hypertensive heart disease with heart failure: Secondary | ICD-10-CM | POA: Diagnosis not present

## 2015-07-30 DIAGNOSIS — Z1211 Encounter for screening for malignant neoplasm of colon: Secondary | ICD-10-CM

## 2015-07-30 DIAGNOSIS — R233 Spontaneous ecchymoses: Secondary | ICD-10-CM

## 2015-07-30 DIAGNOSIS — I480 Paroxysmal atrial fibrillation: Secondary | ICD-10-CM | POA: Diagnosis present

## 2015-07-30 DIAGNOSIS — J441 Chronic obstructive pulmonary disease with (acute) exacerbation: Principal | ICD-10-CM

## 2015-07-30 DIAGNOSIS — R410 Disorientation, unspecified: Secondary | ICD-10-CM | POA: Diagnosis not present

## 2015-07-30 DIAGNOSIS — F411 Generalized anxiety disorder: Secondary | ICD-10-CM

## 2015-07-30 DIAGNOSIS — G9341 Metabolic encephalopathy: Secondary | ICD-10-CM | POA: Diagnosis present

## 2015-07-30 DIAGNOSIS — J9622 Acute and chronic respiratory failure with hypercapnia: Secondary | ICD-10-CM | POA: Diagnosis present

## 2015-07-30 DIAGNOSIS — Z0000 Encounter for general adult medical examination without abnormal findings: Secondary | ICD-10-CM

## 2015-07-30 DIAGNOSIS — Z87891 Personal history of nicotine dependence: Secondary | ICD-10-CM

## 2015-07-30 DIAGNOSIS — Z598 Other problems related to housing and economic circumstances: Secondary | ICD-10-CM

## 2015-07-30 DIAGNOSIS — J9600 Acute respiratory failure, unspecified whether with hypoxia or hypercapnia: Secondary | ICD-10-CM

## 2015-07-30 DIAGNOSIS — C3490 Malignant neoplasm of unspecified part of unspecified bronchus or lung: Secondary | ICD-10-CM | POA: Diagnosis present

## 2015-07-30 DIAGNOSIS — I5032 Chronic diastolic (congestive) heart failure: Secondary | ICD-10-CM

## 2015-07-30 DIAGNOSIS — Z931 Gastrostomy status: Secondary | ICD-10-CM

## 2015-07-30 DIAGNOSIS — R718 Other abnormality of red blood cells: Secondary | ICD-10-CM

## 2015-07-30 DIAGNOSIS — J9602 Acute respiratory failure with hypercapnia: Secondary | ICD-10-CM | POA: Diagnosis not present

## 2015-07-30 DIAGNOSIS — Z7952 Long term (current) use of systemic steroids: Secondary | ICD-10-CM | POA: Diagnosis not present

## 2015-07-30 DIAGNOSIS — Z515 Encounter for palliative care: Secondary | ICD-10-CM | POA: Diagnosis not present

## 2015-07-30 DIAGNOSIS — D6489 Other specified anemias: Secondary | ICD-10-CM | POA: Diagnosis present

## 2015-07-30 DIAGNOSIS — I2510 Atherosclerotic heart disease of native coronary artery without angina pectoris: Secondary | ICD-10-CM | POA: Diagnosis present

## 2015-07-30 DIAGNOSIS — T17908D Unspecified foreign body in respiratory tract, part unspecified causing other injury, subsequent encounter: Secondary | ICD-10-CM

## 2015-07-30 DIAGNOSIS — R053 Chronic cough: Secondary | ICD-10-CM

## 2015-07-30 DIAGNOSIS — J9801 Acute bronchospasm: Secondary | ICD-10-CM | POA: Diagnosis not present

## 2015-07-30 DIAGNOSIS — S2232XG Fracture of one rib, left side, subsequent encounter for fracture with delayed healing: Secondary | ICD-10-CM

## 2015-07-30 DIAGNOSIS — I10 Essential (primary) hypertension: Secondary | ICD-10-CM

## 2015-07-30 DIAGNOSIS — Z599 Problem related to housing and economic circumstances, unspecified: Secondary | ICD-10-CM

## 2015-07-30 DIAGNOSIS — M810 Age-related osteoporosis without current pathological fracture: Secondary | ICD-10-CM

## 2015-07-30 DIAGNOSIS — G894 Chronic pain syndrome: Secondary | ICD-10-CM

## 2015-07-30 DIAGNOSIS — R739 Hyperglycemia, unspecified: Secondary | ICD-10-CM | POA: Diagnosis present

## 2015-07-30 DIAGNOSIS — T17908A Unspecified foreign body in respiratory tract, part unspecified causing other injury, initial encounter: Secondary | ICD-10-CM | POA: Insufficient documentation

## 2015-07-30 DIAGNOSIS — C3410 Malignant neoplasm of upper lobe, unspecified bronchus or lung: Secondary | ICD-10-CM

## 2015-07-30 DIAGNOSIS — R5382 Chronic fatigue, unspecified: Secondary | ICD-10-CM

## 2015-07-30 DIAGNOSIS — I4891 Unspecified atrial fibrillation: Secondary | ICD-10-CM

## 2015-07-30 DIAGNOSIS — Z923 Personal history of irradiation: Secondary | ICD-10-CM | POA: Diagnosis not present

## 2015-07-30 DIAGNOSIS — R1084 Generalized abdominal pain: Secondary | ICD-10-CM

## 2015-07-30 DIAGNOSIS — G4700 Insomnia, unspecified: Secondary | ICD-10-CM

## 2015-07-30 DIAGNOSIS — J9621 Acute and chronic respiratory failure with hypoxia: Secondary | ICD-10-CM | POA: Diagnosis present

## 2015-07-30 DIAGNOSIS — M542 Cervicalgia: Secondary | ICD-10-CM

## 2015-07-30 DIAGNOSIS — R238 Other skin changes: Secondary | ICD-10-CM

## 2015-07-30 DIAGNOSIS — B370 Candidal stomatitis: Secondary | ICD-10-CM

## 2015-07-30 DIAGNOSIS — M7070 Other bursitis of hip, unspecified hip: Secondary | ICD-10-CM

## 2015-07-30 LAB — BLOOD GAS, ARTERIAL
ACID-BASE EXCESS: 3.4 mmol/L — AB (ref 0.0–2.0)
ACID-BASE EXCESS: 2.8 mmol/L — AB (ref 0.0–2.0)
Bicarbonate: 32.4 mEq/L — ABNORMAL HIGH (ref 20.0–24.0)
Bicarbonate: 31 mEq/L — ABNORMAL HIGH (ref 20.0–24.0)
DRAWN BY: 295031
Delivery systems: POSITIVE
Drawn by: 295031
EXPIRATORY PAP: 7
FIO2: 0.5
FIO2: 0.5
INSPIRATORY PAP: 16
LHR: 18 {breaths}/min
Mode: POSITIVE
O2 SAT: 96.8 %
O2 SAT: 97.5 %
PATIENT TEMPERATURE: 98.6
PATIENT TEMPERATURE: 98.6
PCO2 ART: 85.2 mmHg — AB (ref 35.0–45.0)
PCO2 ART: 76.9 mmHg — AB (ref 35.0–45.0)
PEEP/CPAP: 5 cmH2O
PH ART: 7.205 — AB (ref 7.350–7.450)
PH ART: 7.23 — AB (ref 7.350–7.450)
PO2 ART: 105 mmHg — AB (ref 80.0–100.0)
RATE: 16 resp/min
TCO2: 31.9 mmol/L (ref 0–100)
TCO2: 30.4 mmol/L (ref 0–100)
VT: 450 mL
pO2, Arterial: 112 mmHg — ABNORMAL HIGH (ref 80.0–100.0)

## 2015-07-30 LAB — URINALYSIS, ROUTINE W REFLEX MICROSCOPIC
Bilirubin Urine: NEGATIVE
Glucose, UA: NEGATIVE mg/dL
Hgb urine dipstick: NEGATIVE
KETONES UR: NEGATIVE mg/dL
LEUKOCYTES UA: NEGATIVE
NITRITE: NEGATIVE
PH: 5 (ref 5.0–8.0)
Protein, ur: NEGATIVE mg/dL
SPECIFIC GRAVITY, URINE: 1.015 (ref 1.005–1.030)

## 2015-07-30 LAB — GLUCOSE, CAPILLARY: Glucose-Capillary: 160 mg/dL — ABNORMAL HIGH (ref 65–99)

## 2015-07-30 LAB — MRSA PCR SCREENING: MRSA BY PCR: POSITIVE — AB

## 2015-07-30 LAB — CBC WITH DIFFERENTIAL/PLATELET
Basophils Absolute: 0 10*3/uL (ref 0.0–0.1)
Basophils Relative: 0 %
Eosinophils Absolute: 0.1 10*3/uL (ref 0.0–0.7)
Eosinophils Relative: 1 %
HEMATOCRIT: 35.9 % — AB (ref 36.0–46.0)
Hemoglobin: 10.2 g/dL — ABNORMAL LOW (ref 12.0–15.0)
LYMPHS PCT: 9 %
Lymphs Abs: 0.9 10*3/uL (ref 0.7–4.0)
MCH: 25.6 pg — AB (ref 26.0–34.0)
MCHC: 28.4 g/dL — AB (ref 30.0–36.0)
MCV: 90 fL (ref 78.0–100.0)
MONO ABS: 0.6 10*3/uL (ref 0.1–1.0)
MONOS PCT: 7 %
NEUTROS ABS: 7.9 10*3/uL — AB (ref 1.7–7.7)
Neutrophils Relative %: 83 %
Platelets: 233 10*3/uL (ref 150–400)
RBC: 3.99 MIL/uL (ref 3.87–5.11)
RDW: 18.4 % — AB (ref 11.5–15.5)
WBC: 9.5 10*3/uL (ref 4.0–10.5)

## 2015-07-30 LAB — COMPREHENSIVE METABOLIC PANEL
ALT: 11 U/L — AB (ref 14–54)
AST: 11 U/L — AB (ref 15–41)
Albumin: 3.5 g/dL (ref 3.5–5.0)
Alkaline Phosphatase: 75 U/L (ref 38–126)
Anion gap: 8 (ref 5–15)
BILIRUBIN TOTAL: 0.5 mg/dL (ref 0.3–1.2)
BUN: 21 mg/dL — AB (ref 6–20)
CHLORIDE: 94 mmol/L — AB (ref 101–111)
CO2: 37 mmol/L — ABNORMAL HIGH (ref 22–32)
CREATININE: 1.16 mg/dL — AB (ref 0.44–1.00)
Calcium: 7.9 mg/dL — ABNORMAL LOW (ref 8.9–10.3)
GFR, EST AFRICAN AMERICAN: 55 mL/min — AB (ref >60)
GFR, EST NON AFRICAN AMERICAN: 47 mL/min — AB (ref >60)
Glucose, Bld: 99 mg/dL (ref 65–99)
POTASSIUM: 4.6 mmol/L (ref 3.5–5.1)
Sodium: 139 mmol/L (ref 135–145)
Total Protein: 6.2 g/dL — ABNORMAL LOW (ref 6.5–8.1)

## 2015-07-30 LAB — I-STAT CG4 LACTIC ACID, ED: Lactic Acid, Venous: 0.62 mmol/L (ref 0.5–2.0)

## 2015-07-30 MED ORDER — IPRATROPIUM-ALBUTEROL 0.5-2.5 (3) MG/3ML IN SOLN
3.0000 mL | Freq: Four times a day (QID) | RESPIRATORY_TRACT | Status: DC
  Administered 2015-07-30 – 2015-08-09 (×39): 3 mL via RESPIRATORY_TRACT
  Filled 2015-07-30 (×41): qty 3

## 2015-07-30 MED ORDER — MIDAZOLAM HCL 2 MG/2ML IJ SOLN
1.0000 mg | INTRAMUSCULAR | Status: DC | PRN
  Administered 2015-07-30 – 2015-08-03 (×13): 1 mg via INTRAVENOUS
  Filled 2015-07-30 (×18): qty 2

## 2015-07-30 MED ORDER — VANCOMYCIN HCL IN DEXTROSE 1-5 GM/200ML-% IV SOLN
1000.0000 mg | Freq: Two times a day (BID) | INTRAVENOUS | Status: DC
  Administered 2015-07-31 – 2015-08-01 (×3): 1000 mg via INTRAVENOUS
  Filled 2015-07-30 (×3): qty 200

## 2015-07-30 MED ORDER — FENTANYL BOLUS VIA INFUSION
25.0000 ug | INTRAVENOUS | Status: DC | PRN
  Filled 2015-07-30: qty 25

## 2015-07-30 MED ORDER — SODIUM CHLORIDE 0.9 % IV BOLUS (SEPSIS)
500.0000 mL | Freq: Once | INTRAVENOUS | Status: AC
  Administered 2015-07-30: 500 mL via INTRAVENOUS

## 2015-07-30 MED ORDER — DEXTROSE 5 % IV SOLN
2.0000 g | Freq: Once | INTRAVENOUS | Status: AC
  Administered 2015-07-30: 2 g via INTRAVENOUS
  Filled 2015-07-30: qty 2

## 2015-07-30 MED ORDER — METHYLPREDNISOLONE SODIUM SUCC 40 MG IJ SOLR
40.0000 mg | Freq: Three times a day (TID) | INTRAMUSCULAR | Status: DC
  Administered 2015-07-31 – 2015-08-01 (×5): 40 mg via INTRAVENOUS
  Filled 2015-07-30 (×5): qty 1

## 2015-07-30 MED ORDER — SODIUM CHLORIDE 0.9 % IV BOLUS (SEPSIS)
1000.0000 mL | Freq: Once | INTRAVENOUS | Status: AC
  Administered 2015-07-30: 1000 mL via INTRAVENOUS

## 2015-07-30 MED ORDER — FENTANYL CITRATE (PF) 100 MCG/2ML IJ SOLN
50.0000 ug | INTRAMUSCULAR | Status: DC | PRN
  Filled 2015-07-30: qty 2

## 2015-07-30 MED ORDER — MIDAZOLAM HCL 10 MG/2ML IJ SOLN: 5.0000 mg | Freq: Once | INTRAMUSCULAR | Status: DC

## 2015-07-30 MED ORDER — HEPARIN SODIUM (PORCINE) 5000 UNIT/ML IJ SOLN
5000.0000 [IU] | Freq: Three times a day (TID) | INTRAMUSCULAR | Status: DC
  Administered 2015-07-30 – 2015-08-09 (×30): 5000 [IU] via SUBCUTANEOUS
  Filled 2015-07-30 (×30): qty 1

## 2015-07-30 MED ORDER — ASPIRIN 325 MG PO TABS
325.0000 mg | ORAL_TABLET | Freq: Every day | ORAL | Status: DC
  Administered 2015-07-31 – 2015-08-09 (×11): 325 mg
  Filled 2015-07-30 (×11): qty 1

## 2015-07-30 MED ORDER — SUCCINYLCHOLINE CHLORIDE 20 MG/ML IJ SOLN
120.0000 mg | Freq: Once | INTRAMUSCULAR | Status: AC
Start: 2015-07-30 — End: 2015-07-30
  Administered 2015-07-30: 120 mg via INTRAVENOUS
  Filled 2015-07-30: qty 6

## 2015-07-30 MED ORDER — DEXTROSE 5 % IV SOLN
1.0000 g | Freq: Three times a day (TID) | INTRAVENOUS | Status: DC
  Administered 2015-07-31 – 2015-08-01 (×5): 1 g via INTRAVENOUS
  Filled 2015-07-30 (×6): qty 1

## 2015-07-30 MED ORDER — FAMOTIDINE IN NACL 20-0.9 MG/50ML-% IV SOLN: 20.0000 mg | Freq: Two times a day (BID) | INTRAVENOUS | Status: DC

## 2015-07-30 MED ORDER — SODIUM CHLORIDE 0.9 % IV SOLN
25.0000 ug/h | INTRAVENOUS | Status: DC
  Administered 2015-07-30: 50 ug/h via INTRAVENOUS
  Administered 2015-07-31: 175 ug/h via INTRAVENOUS
  Administered 2015-07-31: 50 ug/h via INTRAVENOUS
  Administered 2015-07-31: 200 ug/h via INTRAVENOUS
  Administered 2015-08-01: 300 ug/h via INTRAVENOUS
  Administered 2015-08-01: 200 ug/h via INTRAVENOUS
  Administered 2015-08-02: 350 ug/h via INTRAVENOUS
  Administered 2015-08-02: 325 ug/h via INTRAVENOUS
  Administered 2015-08-03: 400 ug/h via INTRAVENOUS
  Administered 2015-08-03: 50 ug/h via INTRAVENOUS
  Administered 2015-08-03: 400 ug/h via INTRAVENOUS
  Administered 2015-08-03: 250 ug/h via INTRAVENOUS
  Administered 2015-08-04: 150 ug/h via INTRAVENOUS
  Administered 2015-08-05: 200 ug/h via INTRAVENOUS
  Filled 2015-07-30 (×13): qty 50

## 2015-07-30 MED ORDER — FAMOTIDINE IN NACL 20-0.9 MG/50ML-% IV SOLN
20.0000 mg | Freq: Two times a day (BID) | INTRAVENOUS | Status: DC
  Administered 2015-07-30 – 2015-08-01 (×5): 20 mg via INTRAVENOUS
  Filled 2015-07-30 (×5): qty 50

## 2015-07-30 MED ORDER — SODIUM CHLORIDE 0.9 % IV SOLN: 25.0000 ug/h | INTRAVENOUS | Status: DC

## 2015-07-30 MED ORDER — SODIUM CHLORIDE 0.9 % IV SOLN: 250.0000 mL | INTRAVENOUS | Status: DC | PRN

## 2015-07-30 MED ORDER — SODIUM CHLORIDE 0.9 % IV SOLN
1.0000 g | Freq: Once | INTRAVENOUS | Status: AC
  Administered 2015-07-30: 1 g via INTRAVENOUS
  Filled 2015-07-30 (×2): qty 10

## 2015-07-30 MED ORDER — MIDAZOLAM HCL 2 MG/2ML IJ SOLN
1.0000 mg | INTRAMUSCULAR | Status: AC | PRN
  Administered 2015-07-30 (×3): 1 mg via INTRAVENOUS
  Filled 2015-07-30 (×4): qty 2

## 2015-07-30 MED ORDER — VANCOMYCIN HCL IN DEXTROSE 1-5 GM/200ML-% IV SOLN
1000.0000 mg | Freq: Once | INTRAVENOUS | Status: AC
  Administered 2015-07-30: 1000 mg via INTRAVENOUS
  Filled 2015-07-30: qty 200

## 2015-07-30 MED ORDER — FENTANYL CITRATE (PF) 100 MCG/2ML IJ SOLN
50.0000 ug | Freq: Once | INTRAMUSCULAR | Status: AC
  Administered 2015-07-30: 50 ug via INTRAVENOUS

## 2015-07-30 MED ORDER — ETOMIDATE 2 MG/ML IV SOLN
25.0000 mg | Freq: Once | INTRAVENOUS | Status: AC
  Administered 2015-07-30: 25 mg via INTRAVENOUS

## 2015-07-30 MED ORDER — FENTANYL CITRATE (PF) 100 MCG/2ML IJ SOLN: 50.0000 ug | Freq: Once | INTRAMUSCULAR | Status: DC

## 2015-07-30 MED ORDER — ALBUTEROL SULFATE (2.5 MG/3ML) 0.083% IN NEBU
2.5000 mg | INHALATION_SOLUTION | RESPIRATORY_TRACT | Status: DC | PRN
  Administered 2015-08-07: 2.5 mg via RESPIRATORY_TRACT
  Filled 2015-07-30 (×2): qty 3

## 2015-07-30 MED ORDER — FENTANYL BOLUS VIA INFUSION
25.0000 ug | INTRAVENOUS | Status: DC | PRN
  Administered 2015-08-01 – 2015-08-05 (×4): 25 ug via INTRAVENOUS
  Filled 2015-07-30: qty 25

## 2015-07-30 MED ORDER — FENTANYL CITRATE (PF) 100 MCG/2ML IJ SOLN: 50.0000 ug | INTRAMUSCULAR | Status: DC | PRN

## 2015-07-30 MED ORDER — SODIUM CHLORIDE 0.9 % IV SOLN
INTRAVENOUS | Status: DC
  Administered 2015-07-30 – 2015-08-07 (×5): via INTRAVENOUS

## 2015-07-30 MED ORDER — INSULIN ASPART 100 UNIT/ML ~~LOC~~ SOLN
0.0000 [IU] | SUBCUTANEOUS | Status: DC
  Administered 2015-07-31 (×2): 2 [IU] via SUBCUTANEOUS
  Administered 2015-07-31 (×3): 3 [IU] via SUBCUTANEOUS
  Administered 2015-08-01 (×2): 5 [IU] via SUBCUTANEOUS
  Administered 2015-08-01 (×3): 3 [IU] via SUBCUTANEOUS
  Administered 2015-08-01: 2 [IU] via SUBCUTANEOUS
  Administered 2015-08-02 (×2): 3 [IU] via SUBCUTANEOUS
  Administered 2015-08-02: 5 [IU] via SUBCUTANEOUS
  Administered 2015-08-02 (×2): 3 [IU] via SUBCUTANEOUS
  Administered 2015-08-03: 5 [IU] via SUBCUTANEOUS
  Administered 2015-08-03: 2 [IU] via SUBCUTANEOUS
  Administered 2015-08-03 (×3): 3 [IU] via SUBCUTANEOUS
  Administered 2015-08-03: 2 [IU] via SUBCUTANEOUS
  Administered 2015-08-04 (×2): 3 [IU] via SUBCUTANEOUS
  Administered 2015-08-04: 5 [IU] via SUBCUTANEOUS
  Administered 2015-08-04 (×2): 3 [IU] via SUBCUTANEOUS
  Administered 2015-08-04 – 2015-08-05 (×4): 5 [IU] via SUBCUTANEOUS
  Administered 2015-08-05 – 2015-08-06 (×3): 2 [IU] via SUBCUTANEOUS
  Administered 2015-08-06 – 2015-08-07 (×2): 3 [IU] via SUBCUTANEOUS
  Administered 2015-08-07: 1 [IU] via SUBCUTANEOUS
  Administered 2015-08-07 – 2015-08-08 (×3): 2 [IU] via SUBCUTANEOUS
  Administered 2015-08-08: 3 [IU] via SUBCUTANEOUS
  Administered 2015-08-08: 2 [IU] via SUBCUTANEOUS

## 2015-07-30 NOTE — Progress Notes (Addendum)
ED CM received msg from [facility] of [facility] that the pt is active for [facility], PT, Nurse's Aide She was active with [facility] until 07/16/15 when pt declined services  Pt with [facility] ED visits x 8 and 5 admissions in the last 6 months Last admission Admit Date: 07/15/2015 Discharge date: 07/18/2015 for Acute exacerbation of chronic obstructive pulmonary disease (COPD) ([place]) Pt last saw pulmonary staff T Parrett on 07/25/15 and oncology staff on 07/24/15    ED CM went to see pt She is sitting in bed in [place] style Pt is mumbling incoherently Unable to answer all orientation questions

## 2015-07-30 NOTE — ED Notes (Signed)
Per EMS, pt coming from home, daughter states over the last 24 hours has had an increase in altered LOC. Alert to person, place, disoriented to time and situation. Currently being trx for pneumonia, inspiratory and expiratory wheezes present with rhonchi. Pt presenting lethargic. Total of 10 albuterol, 125 solumedrol, 0.5 Atrovent, with 100 cc NS given in route. Initial BP 77/26. Hx of CHF, COPD, lung ca.

## 2015-07-30 NOTE — ED Notes (Signed)
Patient sitting up in bed, thrashing, trying to pull out ET. Titrated drip from 50 mcg to 100 mcg. Notified MD who gave okay

## 2015-07-30 NOTE — ED Provider Notes (Signed)
CSN: [id number]     Arrival date & time 07/30/15  1422 History   First MD Initiated Contact with Patient 07/30/15 1458     Chief Complaint  Patient presents with  . Altered Mental Status  . Hypotension     (Consider location/radiation/quality/duration/timing/severity/associated sxs/prior Treatment) HPI Level V caveat altered mental status . History is obtained from old records, nursing notes and from patient's daughter via telephone. Patient awakened this morning confused and somewhat combative. She was noted to be cyanotic by her daughter and not wearing her oxygen. Her daughter treated her with home albuterol. She was somewhat better however at approximate noontime today the patient's mental status decline, becoming increasingly confused, uncooperative, dyspneic and cyanotic. EMS was called. EMS treated patient with Solu-Medrol 125 mg IV and albuterol and supplemental oxygen. Patient recently treated for pneumonia. Also status post radiation therapy for squamous cell carcinoma Past Medical History  Diagnosis Date  . Atrial fibrillation ([place])   . Hypertension   . Insomnia   . Nonischemic cardiomyopathy ([place])   . Hyperlipidemia   . Anxiety   . Cholelithiasis   . Insomnia   . Mitral regurgitation     noted 2010  . H/O epistaxis   . Rhinitis, allergic   . CHF (congestive heart failure) ([place])   . Pneumonia     "several times w/exacerbations of the COPD; nothing in the last year" (07/15/2012)  . COPD (chronic obstructive pulmonary disease) ([place])     as of 7/13 on 2-3L, pfts 10/2008 with mod obstruction  . Chronic bronchitis with COPD (chronic obstructive pulmonary disease) ([place])   . Shortness of breath     "all the time right now" (07/15/2012)  . GERD (gastroesophageal reflux disease)   . EGBTDVVO(160.7)     "weekly" (07/15/2012)  . Migraines     "weekly for awhile; cleared up as I got older" (07/15/2012)  . DJD (degenerative joint disease)   . Arthritis     "all over" (07/15/2012)  . OCD  (obsessive compulsive disorder)   . OCD (obsessive compulsive disorder)   . Depression     h/o SI; "last time I was really serious about it was ~ 1997" (07/15/2012)  . Thoracic vertebral fracture ([place]) 11/03/2014  . Primary lung cancer [facility])    Past Surgical History  Procedure Laterality Date  . Tubal ligation  1972  . Cardioversion  2003; 07/2003  . Video bronchoscopy Bilateral 02/27/2015    Procedure: VIDEO BRONCHOSCOPY WITH FLUORO;  Surgeon: Chesley Mires, MD;  Location: [facility];  Service: Cardiopulmonary;  Laterality: Bilateral;   Family History  Problem Relation Age of Onset  . Asthma    . Emphysema    . Allergies    . Cancer      aunt had several types of cancer  . COPD Mother   . Emphysema Mother   . Cirrhosis Father    Social History  Substance Use Topics  . Smoking status: Former Smoker -- 1.00 packs/day for 35 years    Types: Cigarettes    Quit date: 12/10/2014  . Smokeless tobacco: Never Used  . Alcohol Use: No   OB History    No data available     Review of Systems  Unable to perform ROS: Mental status change      Allergies  Ace inhibitors; Codeine; Pseudoeph-doxylamine-dm-apap; Diphenhydramine hcl; Erythromycin; Nsaids; Nyquil; Tramadol hcl; Fosamax; Tomato; Orange fruit; and Pseudoephedrine  Home Medications   Prior to Admission medications   Medication Sig Start Date  End Date Taking? Authorizing Provider  aspirin 325 MG EC tablet Take 650 mg by mouth daily with breakfast.     Historical Provider, MD  budesonide-formoterol (SYMBICORT) 160-4.5 MCG/ACT inhaler Inhale 2 puffs into the lungs 2 (two) times daily.    Historical Provider, MD  busPIRone (BUSPAR) 10 MG tablet Take 10 mg by mouth 3 (three) times daily. 04/22/14   Historical Provider, MD  diazepam (VALIUM) 2 MG tablet 1 by mouth every 6 hours as needed Patient taking differently: Take 2 mg by mouth every 6 (six) hours as needed for anxiety.  05/02/15   Gerlene Fee, NP  diltiazem (CARDIZEM)  120 MG tablet Take 1 tablet (120 mg total) by mouth every 8 (eight) hours. 03/12/15   Rigoberto Noel, MD  FLUoxetine (PROZAC) 20 MG capsule Take 3 capsules by mouth once daily    Historical Provider, MD  furosemide (LASIX) 20 MG tablet Take 20 mg by mouth daily.     Historical Provider, MD  HYDROcodone-acetaminophen (NORCO/VICODIN) 5-325 MG tablet Take 1 tablet by mouth every 4 (four) hours as needed. 06/01/15   Tanna Furry, MD  HYDROcodone-homatropine [facility]) 5-1.5 MG/5ML syrup Take 5 mLs by mouth every 6 (six) hours as needed for cough. 06/17/15   Magdalen Spatz, NP  ipratropium-albuterol (DUONEB) 0.5-2.5 (3) MG/3ML SOLN Take 3 mLs by nebulization 3 (three) times daily. 03/07/15   Tammy S Parrett, NP  metoprolol succinate (TOPROL-XL) 25 MG 24 hr tablet Take 1 tablet (25 mg total) by mouth daily. 02/14/15   Brittainy Erie Noe, PA-C  OXYGEN Inhale 2 L into the lungs continuous. 2-3L/min    Historical Provider, MD  pantoprazole (PROTONIX) 40 MG tablet TAKE ONE TABLET BY MOUTH TWICE DAILY 01/03/14   Dellia Nims, MD  potassium chloride SA (KLOR-CON M20) 20 MEQ tablet Take 10 mEq by mouth 2 (two) times daily.     Historical Provider, MD  predniSONE (DELTASONE) 10 MG tablet Take 1 tablet (10 mg total) by mouth daily with breakfast. 07/22/15   Tammy S Parrett, NP  predniSONE (STERAPRED UNI-PAK 21 TAB) 10 MG (21) TBPK tablet Take 1 tablet (10 mg total) by mouth daily. Take 6 tabs by mouth daily  for 2 days, then 5 tabs for 2 days, then 4 tabs for 2 days, then 3 tabs for 2 days, 2 tabs for 2 days, then 1 tab by mouth daily for 2 days 07/18/15   Jonetta Osgood, MD  SPIRIVA HANDIHALER 18 MCG inhalation capsule PLACE ONE CAPSULE INTO INHALER AND INHALE DAILY 03/07/15   Rigoberto Noel, MD  VENTOLIN HFA 108 (90 Base) MCG/ACT inhaler INHALE ONE TO TWO PUFFS BY MOUTH EVERY 6 HOURS AS NEEDED FOR WHEEZING 03/07/15   Rigoberto Noel, MD   BP 223/202 mmHg  Pulse 68  Resp 26  Wt 170 lb (77.111 kg)  SpO2 83% Physical Exam   Constitutional:  Ill-appearing  HENT:  Head: Normocephalic and atraumatic.  Eyes: Conjunctivae are normal. Pupils are equal, round, and reactive to light.  Neck: Neck supple. No tracheal deviation present. No thyromegaly present.  Cardiovascular: Normal rate.   No murmur heard. Irregularly irregular  Pulmonary/Chest: She is in respiratory distress.  Dyspneic diffuse coarse rhonchi  Abdominal: Soft. Bowel sounds are normal. She exhibits no distension. There is no tenderness.  Musculoskeletal: Normal range of motion. She exhibits no edema or tenderness.  Neurological: She is alert. Coordination normal.  Confused, follows simple commands, answers inappropriately  Skin: Skin is warm and  dry. No rash noted.  Nursing note and vitals reviewed.   ED Course  .Intubation Date/Time: 07/30/2015 5:02 PM Performed by: Orlie Dakin Authorized by: Orlie Dakin Consent: The procedure was performed in an emergent situation. Verbal consent obtained. Consent given by: patient Patient understanding: patient states understanding of the procedure being performed Patient consent: the patient's understanding of the procedure matches consent given Relevant documents: relevant documents present and verified Test results: test results available and properly labeled Site marked: the operative site was not marked Imaging studies: imaging studies available Required items: required blood products, implants, devices, and special equipment available Patient identity confirmed: verbally with patient, arm band and hospital-assigned identification number Indications: respiratory failure and  hypercapnia Intubation method: direct Patient status: paralyzed (RSI) Preoxygenation: BVM Sedatives: etomidate Paralytic: succinylcholine Laryngoscope size: Miller 3 Tube size: 7.5 mm Tube type: cuffed Number of attempts: 1 Cricoid pressure: no Cords visualized: yes Post-procedure assessment: chest rise Breath  sounds: reduced on left Cuff inflated: yes ETT to lip: 24 cm ETT to teeth: 23 cm Tube secured with: ETT holder Chest x-ray interpreted by me. Chest x-ray findings: endotracheal tube too low Tube repositioned: tube repositioned successfully Patient tolerance: Patient tolerated the procedure well with no immediate complications   (including critical care time) Labs Review Labs Reviewed  CULTURE, BLOOD (ROUTINE X 2)  CULTURE, BLOOD (ROUTINE X 2)  URINE CULTURE  COMPREHENSIVE METABOLIC PANEL  CBC WITH DIFFERENTIAL/PLATELET  URINALYSIS, ROUTINE W REFLEX MICROSCOPIC (NOT AT [facility])  I-STAT BETA HCG BLOOD, ED ([facility], [facility], [facility] ONLY)  I-STAT CG4 LACTIC ACID, ED    Imaging Review No results found. I have personally reviewed and evaluated these images and lab results as part of my medical decision-making.   EKG Interpretation   Date/Time:  Tuesday July 30 2015 16:06:50 EDT Ventricular Rate:  143 PR Interval:    QRS Duration: 92 QT Interval:  378 QTC Calculation: 498 R Axis:   -38 Text Interpretation:  Atrial fibrillation Paired ventricular premature  complexes Left axis deviation Low voltage, extremity and precordial leads  Anteroseptal infarct, old No significant change since last tracing  Confirmed by Winfred Leeds  MD, Skeeter Sheard [phone]) on 07/30/2015 4:50:02 PM     Patient was placed on BiPAP, ordered 3:04 PM. 4:50 PM patient is sitting upright and states her breathing is improved she appears more alert however she is still markedly confused. Patient intubated by me 5:02 PM Chest x-ray viewed by me . I updated patient's daughter and let her know that patient has been intubated and is on ventilator. Ventilator settings tidal volume 455 of PEEP rate 18 FiO2 50% Results for orders placed or performed during the hospital encounter of 07/30/15  Comprehensive metabolic panel  Result Value Ref Range   Sodium 139 135 - 145 mmol/L   Potassium 4.6 3.5 - 5.1 mmol/L   Chloride 94 (L) 101 - 111 mmol/L    CO2 37 (H) 22 - 32 mmol/L   Glucose, Bld 99 65 - 99 mg/dL   BUN 21 (H) 6 - 20 mg/dL   Creatinine, Ser 1.16 (H) 0.44 - 1.00 mg/dL   Calcium 7.9 (L) 8.9 - 10.3 mg/dL   Total Protein 6.2 (L) 6.5 - 8.1 g/dL   Albumin 3.5 3.5 - 5.0 g/dL   AST 11 (L) 15 - 41 U/L   ALT 11 (L) 14 - 54 U/L   Alkaline Phosphatase 75 38 - 126 U/L   Total Bilirubin 0.5 0.3 - 1.2 mg/dL   GFR calc non  Af Amer 47 (L) >60 mL/min   GFR calc Af Amer 55 (L) >60 mL/min   Anion gap 8 5 - 15  CBC with Differential  Result Value Ref Range   WBC 9.5 4.0 - 10.5 K/uL   RBC 3.99 3.87 - 5.11 MIL/uL   Hemoglobin 10.2 (L) 12.0 - 15.0 g/dL   HCT 35.9 (L) 36.0 - 46.0 %   MCV 90.0 78.0 - 100.0 fL   MCH 25.6 (L) 26.0 - 34.0 pg   MCHC 28.4 (L) 30.0 - 36.0 g/dL   RDW 18.4 (H) 11.5 - 15.5 %   Platelets 233 150 - 400 K/uL   Neutrophils Relative % 83 %   Neutro Abs 7.9 (H) 1.7 - 7.7 K/uL   Lymphocytes Relative 9 %   Lymphs Abs 0.9 0.7 - 4.0 K/uL   Monocytes Relative 7 %   Monocytes Absolute 0.6 0.1 - 1.0 K/uL   Eosinophils Relative 1 %   Eosinophils Absolute 0.1 0.0 - 0.7 K/uL   Basophils Relative 0 %   Basophils Absolute 0.0 0.0 - 0.1 K/uL  Blood gas, arterial  Result Value Ref Range   FIO2 0.50    Delivery systems BILEVEL POSITIVE AIRWAY PRESSURE    Mode BILEVEL POSITIVE AIRWAY PRESSURE    LHR 16 resp/min   Inspiratory PAP 16    Expiratory PAP 7    pH, Arterial 7.205 (L) 7.350 - 7.450   pCO2 arterial 85.2 (HH) 35.0 - 45.0 mmHg   pO2, Arterial 105 (H) 80.0 - 100.0 mmHg   Bicarbonate 32.4 (H) 20.0 - 24.0 mEq/L   TCO2 31.9 0 - 100 mmol/L   Acid-Base Excess 3.4 (H) 0.0 - 2.0 mmol/L   O2 Saturation 96.8 %   Patient temperature 98.6    Collection site RIGHT RADIAL    Drawn by [id number]    Sample type ARTERIAL DRAW    Allens test (pass/fail) PASS PASS  I-Stat CG4 Lactic Acid, ED  Result Value Ref Range   Lactic Acid, Venous 0.62 0.5 - 2.0 mmol/L   Dg Chest 2 View  07/15/2015  CLINICAL DATA:  Shortness of breath since  2 a.m. today.  Wheezing. EXAM: CHEST  2 VIEW COMPARISON:  06/30/2015. FINDINGS: Enlarged cardiac silhouette without significant change. The pulmonary vasculature and interstitial markings remain prominent. Interval small amount of ill-defined opacity at the left lung base . No pleural fluid. Diffuse osteopenia. Tortuous aorta. IMPRESSION: 1. Mild left basilar atelectasis or pneumonia. 2. Stable cardiomegaly, pulmonary vascular congestion and interstitial lung disease. Electronically Signed   By: Claudie Revering M.D.   On: 07/15/2015 14:02   Dg Chest 2 View  07/01/2015  CLINICAL DATA:  Shortness of breath for 1 day.  Wheezing. EXAM: CHEST  2 VIEW COMPARISON:  Most recent chest radiograph 06/15/2015. Chest CT 06/01/2015 FINDINGS: Cardiomegaly is unchanged. Increased vascular congestion. There is peribronchial thickening. The right upper lobe interstitial opacities are stable from recent priors, better defined on CT. No new airspace disease or confluent lobar consolidation. No pleural effusion or pneumothorax. IMPRESSION: 1. Stable cardiomegaly. Increased vascular congestion from prior. Peribronchial cuffing may be secondary to pulmonary edema or bronchial thickening. 2. Unchanged interstitial opacities in the right upper lung, better defined on prior CT. Electronically Signed   By: Jeb Levering M.D.   On: 07/01/2015 00:35   Ct Angio Chest Pe W/cm &/or Wo Cm  07/15/2015  CLINICAL DATA:  Per EMS, patient states she has been short of breath since 2am today. Lung ca  Pt. C/o shortness of breath, history of non-small cell lung carcinoma dx'd 02/2015, xrt complete, shortness of breath, history of non-small cell lung carcinoma dx'd 02/2015, xrt complete, pt. states onset of severe shortness of breath yesterday evening, cough and congestion, hx of COPD, hypoxia, EXAM: CT ANGIOGRAPHY CHEST WITH CONTRAST TECHNIQUE: Multidetector CT imaging of the chest was performed using the standard protocol during bolus administration of  intravenous contrast. Multiplanar CT image reconstructions and MIPs were obtained to evaluate the vascular anatomy. CONTRAST:  100 mL Isovue 370 IV COMPARISON:  PET-CT 07/12/2015 and previous FINDINGS: Vascular: Left arm IV contrast injection. Innominate vein and SVC are patent. Right atrium and right ventricle are nondilated. Satisfactory opacification of pulmonary arteries noted, and there is no evidence of pulmonary emboli. Breathing motion degrades some of the images. Patent bilateral pulmonary veins drain into the left atrium. Mild left atrial enlargement. Scattered coronary calcifications. Adequate contrast opacification of the thoracic aorta with no evidence of dissection, aneurysm, or stenosis. There is bovine variant brachiocephalic arch anatomy without proximal stenosis. Descending segment is ectatic and atheromatous. Scattered calcifications throughout the aorta. Infrarenal segment not evaluated. Mediastinum/Lymph Nodes: Sub cm precarinal, AP window, and subcarinal lymph nodes. No pericardial effusion. Lungs/Pleura: Patchy airspace opacities peripherally in the right upper lobe, new since PET-CT. There is some atelectasis or infiltrate in the inferior and posterior aspect of the lingula and posteriorly in both lower lobes left greater than right. Motion degrades portions of examination. No pleural effusion. No pneumothorax. Upper abdomen: Cholelithiasis.  No acute abnormality. Musculoskeletal: Come T U/S compression deformities in the mid thoracic spine are stable since prior CT of 06/01/2015. No worrisome lytic or sclerotic bone lesion. Breathing motion degrades the coronal and sagittal reconstructions. Review of the MIP images confirms the above findings. IMPRESSION: 1. Negative for acute PE or thoracic aortic dissection. 2. New patchy airspace opacities in the right upper lobe, lingula, and posterior lung bases. 3. Atherosclerosis, including aortic hand coronary artery disease. Please note that  although the presence of coronary artery calcium documents the presence of coronary artery disease, the severity of this disease and any potential stenosis cannot be assessed on this non-gated CT examination. Assessment for potential risk factor modification, dietary therapy or pharmacologic therapy may be warranted, if clinically indicated. 4. Cholelithiasis. Electronically Signed   By: Lucrezia Europe M.D.   On: 07/15/2015 15:53   Nm Pet Image Restag (ps) Skull Base To Thigh  07/12/2015  CLINICAL DATA:  Subsequent Treatment strategy for squamous cell lung cancer. EXAM: NUCLEAR MEDICINE PET SKULL BASE TO THIGH TECHNIQUE: 8.1 mCi F-18 FDG was injected intravenously. Full-ring PET imaging was performed from the skull base to thigh after the radiotracer. CT data was obtained and used for attenuation correction and anatomic localization. FASTING BLOOD GLUCOSE:  Value: 109 mg/dl COMPARISON:  03/13/2015 FINDINGS: NECK No hypermetabolic lymph nodes in the neck. CHEST Continued interval improvement in the lingular airspace disease noted on today's study. No evidence for hypermetabolic FDG accumulation within the residual trace lingular opacity. The solitary focus of hypermetabolic activity seen in the left hilum on the previous study has also resolved in the interval. There is no evidence for mediastinal or hilar lymphadenopathy on the uninfused CT images obtained for attenuation correction. ABDOMEN/PELVIS No abnormal hypermetabolic activity within the liver, pancreas, adrenal glands, or spleen. No hypermetabolic lymph nodes in the abdomen or pelvis. Focal hypermetabolism in the region of the anus again noted, stable in the interval. Calcified gallstones again noted. There is abdominal aortic  atherosclerosis without aneurysm. SKELETON No focal hypermetabolic activity to suggest skeletal metastasis. IMPRESSION: 1. Continued further decrease in lingular opacity and no residual lingular hypermetabolism on today's exam. 2.  Interval resolution of the solitary hypermetabolic focus in the left hilum. No evidence for hypermetabolic metastatic disease in the mediastinum or left hilum on today's exam. 3. No evidence for hypermetabolic metastatic disease in the abdomen or pelvis. 4. Persistent hypermetabolism in the anus. This may be physiologic, but neoplastic involvement cannot be excluded. Electronically Signed   By: Misty Stanley M.D.   On: 07/12/2015 13:06   Dg Chest Portable 1 View  07/30/2015  CLINICAL DATA:  Evaluate endotracheal tube placement. EXAM: PORTABLE CHEST 1 VIEW COMPARISON:  07/30/2015 FINDINGS: Endotracheal tube is approximately 1.0 cm above the carina. Again noted are coarse lung markings which appear to be chronic. Difficult to exclude pulmonary edema. Heart size is enlarged and stable. Negative for a pneumothorax. No acute bone abnormality. Mild volume loss in the left hemithorax appears unchanged. IMPRESSION: Endotracheal tube is positioned just above the carina. Chronic lung changes and difficult to exclude interstitial edema. Electronically Signed   By: Markus Daft M.D.   On: 07/30/2015 17:29   Dg Chest Port 1 View  07/30/2015  CLINICAL DATA:  Altered level of consciousness, worsening over the past 24 hours. EXAM: PORTABLE CHEST 1 VIEW COMPARISON:  07/15/2015 FINDINGS: The heart is mildly enlarged but stable. Stable tortuosity, ectasia and calcification of the thoracic aorta. Stable eventration of the right hemidiaphragm. Chronic bronchitic type lung changes but no definite acute overlying pulmonary findings. The bony thorax is grossly intact. IMPRESSION: Chronic changes.  No acute findings. Stable aortic atherosclerosis. Electronically Signed   By: Marijo Sanes M.D.   On: 07/30/2015 15:52   Patient received 30 mL/kg bolus of normal saline. Blood pressure improved at 1720 6 PM MDM  Arterial blood gas consistent with respiratory failure while on BiPAP. Code sepsis initially called based on surgical repair  of hypotension, tachypnea. Suspected source felt to be respiratory. Patient felt to be in acute respiratory failure secondary to COPD exacerbation.  Final diagnoses:  None   Critical care team Dr. Oletta Darter consulted and will come to evaluate patient Dx #1 acute  Hypercarbic  respiratory failure #2 anemia CRITICAL CARE Performed by: Orlie Dakin Total critical care time: 60 minutes Critical care time was exclusive of separately billable procedures and treating other patients. Critical care was necessary to treat or prevent imminent or life-threatening deterioration. Critical care was time spent personally by me on the following activities: development of treatment plan with patient and/or surrogate as well as nursing, discussions with consultants, evaluation of patient's response to treatment, examination of patient, obtaining history from patient or surrogate, ordering and performing treatments and interventions, ordering and review of laboratory studies, ordering and review of radiographic studies, pulse oximetry and re-evaluation of patient's condition.    Orlie Dakin, MD 07/30/15 [phone]

## 2015-07-30 NOTE — ED Notes (Signed)
Bed: RESB Expected date:  Expected time:  Means of arrival:  Comments: No Monitor

## 2015-07-30 NOTE — Progress Notes (Signed)
Pt transported to ICU room 1223.  Pt transported without complications.  Pt remained stable throughout trip.  Report given to ICU RT.

## 2015-07-30 NOTE — Progress Notes (Signed)
Pharmacy Antibiotic Note  KRISTENA WILHELMI is a 70 y.o. female admitted on 07/30/2015 with pneumonia.  Recently admitted 6/5-6/8 for AECOPD.  Pharmacy has been consulted for Vancomycin and Cefepime dosing.  First doses already ordered in ED.  Plan: Vancomycin 1g IV q12h. Cefepime 1g IV q8h. F/u SCr, trough levels as indicated, clinical course.  Weight: 170 lb (77.111 kg)  Temp (24hrs), Avg:97.5 F (36.4 C), Min:97.5 F (36.4 C), Max:97.5 F (36.4 C)   Recent Labs Lab 07/30/15 1442 07/30/15 1530  WBC 9.5  --   LATICACIDVEN  --  0.62    Estimated Creatinine Clearance: 64.2 mL/min (by C-G formula based on Cr of 0.86).    Allergies  Allergen Reactions  . Ace Inhibitors Cough  . Codeine Swelling    Face and lips swelling  Patient report Can tolerate hydrocodone  . Pseudoeph-Doxylamine-Dm-Apap Itching  . Diphenhydramine Hcl Other (See Comments)    "feels like my skin is crawling, and legs twitches"  . Erythromycin Diarrhea  . Nsaids Nausea And Vomiting  . Nyquil [Pseudoeph-Doxylamine-Dm-Apap] Itching  . Tramadol Hcl Other (See Comments)    stomach pain, hallucinations  . Fosamax [Alendronate] Other (See Comments)    Nausea and abdominal bloating.   . Tomato Other (See Comments)    Stomach swells  . Orange Fruit [Citrus] Rash  . Pseudoephedrine Palpitations    Antimicrobials this admission: 6/20 Vancomycin >>  6/20 Cefepime >>   Dose adjustments this admission: -  Microbiology results: 6/20 BCx: sent 6/20 UCx: sent   Thank you for allowing pharmacy to be a part of this patient's care.  Hershal Coria 07/30/2015 3:54 PM

## 2015-07-30 NOTE — H&P (Signed)
PULMONARY / CRITICAL CARE MEDICINE   Name: Kelsey Garcia MRN: [id number] DOB: 04/01/1945    ADMISSION DATE:  07/30/2015 CONSULTATION DATE:  07/30/15  REFERRING MD:  EDP  CHIEF COMPLAINT:  AMS  HISTORY OF PRESENT ILLNESS:  Pt is encephelopathic; therefore, this HPI is obtained from chart review. Kelsey Garcia is a 70 y.o. female with PMH as outlined below including COPD on chronic 2 - 3 L home O2 and '10mg'$  prednisone (followed by [name]) and stage II B NSCLC s/p XRT (followed by Dr. Earlie Server).  She had recent admission 07/15/15 through 07/18/15 for CAP and AECOPD.    She presented to [facility] ED 06/20 after she awoke that morning somewhat confused and cyanotic per her daughter.  She did not have her oxygen in at the time.  She had improved somewhat; however, around noon, her mental status worsened and she began to get more dyspneic and cyanotic.  EMS was called and pt was brought to ED for further evaluation.  In ED, she was started on BiPAP but failed.  Per records, pt is listed as DNR; however, RN informs me that pt agreed to intubation given that she failed BiPAP.  She was subsequently intubated and PCCM was called for admission.   PAST MEDICAL HISTORY :  She  has a past medical history of Atrial fibrillation ([place]); Hypertension; Insomnia; Nonischemic cardiomyopathy ([place]); Hyperlipidemia; Anxiety; Cholelithiasis; Insomnia; Mitral regurgitation; H/O epistaxis; Rhinitis, allergic; CHF (congestive heart failure) ([place]); Pneumonia; COPD (chronic obstructive pulmonary disease) ([place]); Chronic bronchitis with COPD (chronic obstructive pulmonary disease) ([place]); Shortness of breath; GERD (gastroesophageal reflux disease); Headache(784.0); Migraines; DJD (degenerative joint disease); Arthritis; OCD (obsessive compulsive disorder); OCD (obsessive compulsive disorder); Depression; Thoracic vertebral fracture ([place]) (11/03/2014); and Primary lung cancer ([place]).  PAST SURGICAL HISTORY: She  has past surgical history  that includes Tubal ligation (1972); Cardioversion (2003; 07/2003); and Video bronchoscopy (Bilateral, 02/27/2015).  Allergies  Allergen Reactions  . Ace Inhibitors Cough  . Codeine Swelling    Face and lips swelling  Patient report Can tolerate hydrocodone  . Pseudoeph-Doxylamine-Dm-Apap Itching  . Diphenhydramine Hcl Other (See Comments)    "feels like my skin is crawling, and legs twitches"  . Erythromycin Diarrhea  . Nsaids Nausea And Vomiting  . Nyquil [Pseudoeph-Doxylamine-Dm-Apap] Itching  . Tramadol Hcl Other (See Comments)    stomach pain, hallucinations  . Fosamax [Alendronate] Other (See Comments)    Nausea and abdominal bloating.   . Tomato Other (See Comments)    Stomach swells  . Orange Fruit [Citrus] Rash  . Pseudoephedrine Palpitations    No current facility-administered medications on file prior to encounter.   Current Outpatient Prescriptions on File Prior to Encounter  Medication Sig  . aspirin 325 MG EC tablet Take 650 mg by mouth daily with breakfast.   . budesonide-formoterol (SYMBICORT) 160-4.5 MCG/ACT inhaler Inhale 2 puffs into the lungs 2 (two) times daily.  . busPIRone (BUSPAR) 10 MG tablet Take 10 mg by mouth 3 (three) times daily.  . diazepam (VALIUM) 2 MG tablet 1 by mouth every 6 hours as needed (Patient taking differently: Take 2 mg by mouth every 6 (six) hours as needed for anxiety. )  . diltiazem (CARDIZEM) 120 MG tablet Take 1 tablet (120 mg total) by mouth every 8 (eight) hours.  Marland Kitchen FLUoxetine (PROZAC) 20 MG capsule Take 3 capsules by mouth once daily  . furosemide (LASIX) 20 MG tablet Take 20 mg by mouth daily.   Marland Kitchen HYDROcodone-acetaminophen (NORCO/VICODIN) 5-325 MG tablet  Take 1 tablet by mouth every 4 (four) hours as needed.  Marland Kitchen HYDROcodone-homatropine (HYCODAN) 5-1.5 MG/5ML syrup Take 5 mLs by mouth every 6 (six) hours as needed for cough.  Marland Kitchen ipratropium-albuterol (DUONEB) 0.5-2.5 (3) MG/3ML SOLN Take 3 mLs by nebulization 3 (three) times  daily.  . metoprolol succinate (TOPROL-XL) 25 MG 24 hr tablet Take 1 tablet (25 mg total) by mouth daily.  . OXYGEN Inhale 2 L into the lungs continuous. 2-3L/min  . pantoprazole (PROTONIX) 40 MG tablet TAKE ONE TABLET BY MOUTH TWICE DAILY  . potassium chloride SA (KLOR-CON M20) 20 MEQ tablet Take 10 mEq by mouth 2 (two) times daily.   . predniSONE (DELTASONE) 10 MG tablet Take 1 tablet (10 mg total) by mouth daily with breakfast.  . predniSONE (STERAPRED UNI-PAK 21 TAB) 10 MG (21) TBPK tablet Take 1 tablet (10 mg total) by mouth daily. Take 6 tabs by mouth daily  for 2 days, then 5 tabs for 2 days, then 4 tabs for 2 days, then 3 tabs for 2 days, 2 tabs for 2 days, then 1 tab by mouth daily for 2 days  . SPIRIVA HANDIHALER 18 MCG inhalation capsule PLACE ONE CAPSULE INTO INHALER AND INHALE DAILY  . VENTOLIN HFA 108 (90 Base) MCG/ACT inhaler INHALE ONE TO TWO PUFFS BY MOUTH EVERY 6 HOURS AS NEEDED FOR WHEEZING    FAMILY HISTORY:  Her indicated that her mother is deceased. She indicated that her father is deceased.   SOCIAL HISTORY: She  reports that she quit smoking about 7 months ago. Her smoking use included Cigarettes. She has a 35 pack-year smoking history. She has never used smokeless tobacco. She reports that she does not drink alcohol or use illicit drugs.  REVIEW OF SYSTEMS:  Unable to obtain as pt is encephalopathic.  SUBJECTIVE: On vent, unresponsive.  VITAL SIGNS: BP 101/61 mmHg  Pulse 85  Temp(Src) 97.5 F (36.4 C) (Rectal)  Resp 21  Wt 170 lb (77.111 kg)  SpO2 100%  HEMODYNAMICS:    VENTILATOR SETTINGS: Vent Mode:  [-] PRVC FiO2 (%):  [50 %] 50 % Set Rate:  [18 bmp-24 bmp] 24 bmp Vt Set:  [450 mL] 450 mL PEEP:  [5 cmH20] 5 cmH20 Plateau Pressure:  [19 cmH20] 19 cmH20  INTAKE / OUTPUT:    PHYSICAL EXAMINATION: General: Elderly, chronically ill appearing, in NAD. Neuro: Sedated, not responsive. HEENT: [place]/AT. PERRL, sclerae anicteric. Cardiovascular: RRR, no  M/R/G.  Lungs: Respirations even and unlabored.  Coarse with expiratory wheezes bilaterally. Abdomen: BS x 4, soft, NT/ND.  Musculoskeletal: No gross deformities, no edema.  Skin: Intact, warm, no rashes.  LABS:  BMET  Recent Labs Lab 07/30/15 1442  NA 139  K 4.6  CL 94*  CO2 37*  BUN 21*  CREATININE 1.16*  GLUCOSE 99    Electrolytes  Recent Labs Lab 07/30/15 1442  CALCIUM 7.9*    CBC  Recent Labs Lab 07/30/15 1442  WBC 9.5  HGB 10.2*  HCT 35.9*  PLT 233    Coag's No results for input(s): APTT, INR in the last 168 hours.  Sepsis Markers  Recent Labs Lab 07/30/15 1530  LATICACIDVEN 0.62    ABG  Recent Labs Lab 07/30/15 1536 07/30/15 1802  PHART 7.205* 7.230*  PCO2ART 85.2* 76.9*  PO2ART 105* 112*    Liver Enzymes  Recent Labs Lab 07/30/15 1442  AST 11*  ALT 11*  ALKPHOS 75  BILITOT 0.5  ALBUMIN 3.5    Cardiac Enzymes No  results for input(s): TROPONINI, PROBNP in the last 168 hours.  Glucose No results for input(s): GLUCAP in the last 168 hours.  Imaging Dg Chest Portable 1 View  07/30/2015  CLINICAL DATA:  Evaluate endotracheal tube placement. EXAM: PORTABLE CHEST 1 VIEW COMPARISON:  07/30/2015 FINDINGS: Endotracheal tube is approximately 1.0 cm above the carina. Again noted are coarse lung markings which appear to be chronic. Difficult to exclude pulmonary edema. Heart size is enlarged and stable. Negative for a pneumothorax. No acute bone abnormality. Mild volume loss in the left hemithorax appears unchanged. IMPRESSION: Endotracheal tube is positioned just above the carina. Chronic lung changes and difficult to exclude interstitial edema. Electronically Signed   By: Markus Daft M.D.   On: 07/30/2015 17:29   Dg Chest Port 1 View  07/30/2015  CLINICAL DATA:  Altered level of consciousness, worsening over the past 24 hours. EXAM: PORTABLE CHEST 1 VIEW COMPARISON:  07/15/2015 FINDINGS: The heart is mildly enlarged but stable. Stable  tortuosity, ectasia and calcification of the thoracic aorta. Stable eventration of the right hemidiaphragm. Chronic bronchitic type lung changes but no definite acute overlying pulmonary findings. The bony thorax is grossly intact. IMPRESSION: Chronic changes.  No acute findings. Stable aortic atherosclerosis. Electronically Signed   By: Marijo Sanes M.D.   On: 07/30/2015 15:52     STUDIES:  CXR 06/20 > no acute process.  CULTURES: Blood 06/20 > Sputum 06/20 >  ANTIBIOTICS: Vanc 06/20 > Cefepime 06/20 >  SIGNIFICANT EVENTS: 06/20 > admitted with AECOPD and concern for HCAP.  LINES/TUBES: ETT 06/20 >  DISCUSSION: 70 y.o. F with hx O2 and steroid dependent COPD and stage II B NSCLC s/p XRT, admitte 06/20 with AECOPD.  Failed BiPAP and was intubated in ED.  ASSESSMENT / PLAN:  PULMONARY A: Acute on chronic hypoxemic and hypercapnic respiratory failure - required intubation in ED (though note, pt formerly listed as DNR). AECOPD. At risk for HCAP. Stage IIB non-small cell lung cancer - s/p XRT (followed by Dr. Julien Nordmann). P:   Full vent support. Wean as able. VAP prevention measures. SBT in AM if able. DuoNebs / Albuterol. Solumedrol. Abx per ID section. CXR in AM.  CARDIOVASCULAR A:  Hx CAD, sCHF (EF 50-55% per echo from Jan 2016), HTN. P:  Monitor hemodynamics. Continue outpatient ASA. Hold outpatient diltiazem, furosemide, toprol-xl.  RENAL A:   AKI. Hypocalcemia. P:   NS @ 100. 1g Ca gluconate. BMP in AM.  GASTROINTESTINAL A:   GERD. Nutrition. P:   Famotidine. NPO.  HEMATOLOGIC / ONCOLOGIC A:   VTE Prophylaxis. Stage IIB non-small cell lung cancer - s/p XRT (followed by Dr. Julien Nordmann). P:  SCD's / Heparin. CBC in AM. Follow up as outpatient with Dr. Julien Nordmann.  INFECTIOUS A:   AECOPD. At risk for HCAP. P:  Abx as above (Vanc / Zosyn).  Follow cultures as above.  ENDOCRINE A:   No acute issues. P:   No interventions  required.  NEUROLOGIC A:   Acute hypercarbic encephalopathy. Hx anxiety. P:   Sedation:  Fentanyl gtt / Midazolam PRN. RASS goal: 0 to -1. Daily WUA. Hold outpatient buspirone, diazepam, norco.  Family updated: None.  Interdisciplinary Family Meeting v Palliative Care Meeting:  Due by: 08/05/15.  During last admission 07/15/15, pt verbally requested DNR after discussion with admitting team per H&P notes.  This admit, RN reports that while pt was on BiPAP and failing, she agreed to intubation.  Given that she is already intubated, we will continue with  supportive care and hopefully can get her extubated.  Until further discussions can be had, we will also list her as full DNR per her wishes from last hospitalization.  CC time: 40 minutes.   [name], [facility] Pager: [phone]  or [phone] 07/30/2015, 7:23 PM

## 2015-07-30 NOTE — ED Notes (Signed)
Bed: UL24 Expected date:  Expected time:  Means of arrival:  Comments: EMS- 70yo F, AMS/Hx COPD and CHF

## 2015-07-30 NOTE — ED Notes (Signed)
Dentures placed in belongings bag

## 2015-07-30 NOTE — ED Notes (Signed)
Critical care called again for Dr Arthor Captain.

## 2015-07-30 NOTE — ED Notes (Signed)
Notified Respiratory for need for Bi-PAP

## 2015-07-30 NOTE — ED Notes (Signed)
ER MD aware of patient vitals, told him I would notify him if the BP doesn't improve shortly

## 2015-07-30 NOTE — ED Notes (Signed)
Patient continues to fight ET tube. Notified MD who spoke with pharmacist.

## 2015-07-30 NOTE — Progress Notes (Signed)
eLink Physician-Brief Progress Note Patient Name: Kelsey Garcia DOB: 15-Sep-1945 MRN: [id number]   Date of Service  07/30/2015  HPI/Events of Note  Hypotension - BP = 92/58. LVEF = 50% to 55%.  eICU Interventions  Will bolus with 0.9 NaCl 1 liter IV over 1 hour now.      Intervention Category Intermediate Interventions: Hypotension - evaluation and management  Sommer,Steven Eugene 07/30/2015, 8:48 PM

## 2015-07-30 NOTE — ED Notes (Signed)
Gave 25 of Atomidate and 120 of Succs to intubate patient at 1701, successful intubation with 7.5 ETT at 23 at the gum placed at 17:02, positive CO2 detector change with breath sounds auscultated over both sides of lungs.

## 2015-07-30 NOTE — ED Notes (Addendum)
Titrated to 150 mcg on fentanyl, pt began waking up and attempting to pull out her tube and sitting up in bed. Gave ordered versed and titrated fentanyl back to 200 mcg, pressure had improved to 111/68

## 2015-07-31 ENCOUNTER — Inpatient Hospital Stay (HOSPITAL_COMMUNITY): Payer: Medicare Other

## 2015-07-31 LAB — GLUCOSE, CAPILLARY
GLUCOSE-CAPILLARY: 141 mg/dL — AB (ref 65–99)
GLUCOSE-CAPILLARY: 134 mg/dL — AB (ref 65–99)
Glucose-Capillary: 160 mg/dL — ABNORMAL HIGH (ref 65–99)
Glucose-Capillary: 178 mg/dL — ABNORMAL HIGH (ref 65–99)

## 2015-07-31 LAB — BASIC METABOLIC PANEL
Anion gap: 6 (ref 5–15)
BUN: 19 mg/dL (ref 6–20)
CHLORIDE: 106 mmol/L (ref 101–111)
CO2: 29 mmol/L (ref 22–32)
Calcium: 7.7 mg/dL — ABNORMAL LOW (ref 8.9–10.3)
Creatinine, Ser: 0.61 mg/dL (ref 0.44–1.00)
GFR calc Af Amer: >60 mL/min (ref >60)
GFR calc non Af Amer: >60 mL/min (ref >60)
GLUCOSE: 159 mg/dL — AB (ref 65–99)
POTASSIUM: 4 mmol/L (ref 3.5–5.1)
Sodium: 141 mmol/L (ref 135–145)

## 2015-07-31 LAB — CBC
HEMATOCRIT: 28.7 % — AB (ref 36.0–46.0)
HEMOGLOBIN: 8.4 g/dL — AB (ref 12.0–15.0)
MCH: 26.1 pg (ref 26.0–34.0)
MCHC: 29.3 g/dL — ABNORMAL LOW (ref 30.0–36.0)
MCV: 89.1 fL (ref 78.0–100.0)
PLATELETS: 199 10*3/uL (ref 150–400)
RBC: 3.22 MIL/uL — AB (ref 3.87–5.11)
RDW: 18.7 % — ABNORMAL HIGH (ref 11.5–15.5)
WBC: 5.4 10*3/uL (ref 4.0–10.5)

## 2015-07-31 LAB — BLOOD GAS, ARTERIAL
ACID-BASE EXCESS: 0.3 mmol/L (ref 0.0–2.0)
Bicarbonate: 28.1 mEq/L — ABNORMAL HIGH (ref 20.0–24.0)
DRAWN BY: 308601
FIO2: 0.4
LHR: 24 {breaths}/min
MECHVT: 450 mL
O2 SAT: 93.3 %
PATIENT TEMPERATURE: 98.6
PEEP/CPAP: 5 cmH2O
PH ART: 7.237 — AB (ref 7.350–7.450)
PO2 ART: 80.2 mmHg (ref 80.0–100.0)
TCO2: 27.4 mmol/L (ref 0–100)
pCO2 arterial: 68.5 mmHg (ref 35.0–45.0)

## 2015-07-31 LAB — PHOSPHORUS
Phosphorus: 3.2 mg/dL (ref 2.5–4.6)
Phosphorus: 3 mg/dL (ref 2.5–4.6)

## 2015-07-31 LAB — MAGNESIUM
MAGNESIUM: 1.7 mg/dL (ref 1.7–2.4)
MAGNESIUM: 1.8 mg/dL (ref 1.7–2.4)

## 2015-07-31 MED ORDER — FLUOXETINE HCL 20 MG/5ML PO SOLN
20.0000 mg | Freq: Every day | ORAL | Status: DC
  Administered 2015-07-31 – 2015-08-05 (×6): 20 mg
  Filled 2015-07-31 (×6): qty 5

## 2015-07-31 MED ORDER — DILTIAZEM 12 MG/ML ORAL SUSPENSION
120.0000 mg | Freq: Four times a day (QID) | ORAL | Status: DC
  Administered 2015-07-31 – 2015-08-02 (×8): 120 mg
  Filled 2015-07-31 (×12): qty 12

## 2015-07-31 MED ORDER — CHLORHEXIDINE GLUCONATE 0.12% ORAL RINSE (MEDLINE KIT)
15.0000 mL | Freq: Two times a day (BID) | OROMUCOSAL | Status: DC
  Administered 2015-07-31 – 2015-08-06 (×11): 15 mL via OROMUCOSAL

## 2015-07-31 MED ORDER — DIAZEPAM 2 MG PO TABS
2.0000 mg | ORAL_TABLET | Freq: Four times a day (QID) | ORAL | Status: DC
  Administered 2015-07-31 – 2015-08-05 (×20): 2 mg
  Filled 2015-07-31 (×20): qty 1

## 2015-07-31 MED ORDER — FREE WATER
50.0000 mL | Freq: Four times a day (QID) | Status: DC
  Administered 2015-07-31 – 2015-08-04 (×16): 50 mL

## 2015-07-31 MED ORDER — VITAL AF 1.2 CAL PO LIQD
1000.0000 mL | ORAL | Status: DC
  Administered 2015-07-31 – 2015-08-01 (×3): 1000 mL
  Filled 2015-07-31 (×4): qty 1000

## 2015-07-31 MED ORDER — CHLORHEXIDINE GLUCONATE CLOTH 2 % EX PADS
6.0000 | MEDICATED_PAD | Freq: Every day | CUTANEOUS | Status: AC
  Administered 2015-07-31 – 2015-08-04 (×5): 6 via TOPICAL

## 2015-07-31 MED ORDER — DEXMEDETOMIDINE HCL IN NACL 200 MCG/50ML IV SOLN
0.0000 ug/kg/h | INTRAVENOUS | Status: AC
  Administered 2015-07-31: 0.2 ug/kg/h via INTRAVENOUS
  Administered 2015-07-31: 0.9 ug/kg/h via INTRAVENOUS
  Administered 2015-07-31: 0.6 ug/kg/h via INTRAVENOUS
  Administered 2015-07-31: 0.1 ug/kg/h via INTRAVENOUS
  Administered 2015-07-31: 0.6 ug/kg/h via INTRAVENOUS
  Administered 2015-07-31 (×2): 0.9 ug/kg/h via INTRAVENOUS
  Administered 2015-08-01 (×3): 1.2 ug/kg/h via INTRAVENOUS
  Administered 2015-08-01 (×2): 0.9 ug/kg/h via INTRAVENOUS
  Administered 2015-08-01: 1.2 ug/kg/h via INTRAVENOUS
  Administered 2015-08-01: 1.4 ug/kg/h via INTRAVENOUS
  Administered 2015-08-01: 1.3 ug/kg/h via INTRAVENOUS
  Administered 2015-08-02: 1.2 ug/kg/h via INTRAVENOUS
  Administered 2015-08-02 (×5): 1 ug/kg/h via INTRAVENOUS
  Administered 2015-08-02 (×2): 1.2 ug/kg/h via INTRAVENOUS
  Administered 2015-08-02: 1.1 ug/kg/h via INTRAVENOUS
  Administered 2015-08-03: 1 ug/kg/h via INTRAVENOUS
  Administered 2015-08-03 (×4): 1.2 ug/kg/h via INTRAVENOUS
  Filled 2015-07-31 (×29): qty 50
  Filled 2015-07-31: qty 100
  Filled 2015-07-31 (×4): qty 50

## 2015-07-31 MED ORDER — BUSPIRONE HCL 5 MG PO TABS
10.0000 mg | ORAL_TABLET | Freq: Three times a day (TID) | ORAL | Status: DC
  Administered 2015-07-31 – 2015-08-09 (×28): 10 mg
  Filled 2015-07-31 (×27): qty 1
  Filled 2015-07-31: qty 2
  Filled 2015-07-31 (×5): qty 1

## 2015-07-31 MED ORDER — PRO-STAT SUGAR FREE PO LIQD: 30.0000 mL | Freq: Two times a day (BID) | ORAL | Status: DC

## 2015-07-31 MED ORDER — MUPIROCIN 2 % EX OINT
1.0000 "application " | TOPICAL_OINTMENT | Freq: Two times a day (BID) | CUTANEOUS | Status: AC
  Administered 2015-07-31 – 2015-08-04 (×10): 1 via NASAL
  Filled 2015-07-31: qty 22

## 2015-07-31 MED ORDER — VITAL HIGH PROTEIN PO LIQD
1000.0000 mL | ORAL | Status: DC
  Filled 2015-07-31: qty 1000

## 2015-07-31 MED ORDER — DIAZEPAM 5 MG/ML PO CONC: 2.0000 mg | Freq: Four times a day (QID) | ORAL | Status: DC

## 2015-07-31 MED ORDER — ANTISEPTIC ORAL RINSE SOLUTION (CORINZ)
7.0000 mL | Freq: Four times a day (QID) | OROMUCOSAL | Status: DC
  Administered 2015-07-31 – 2015-08-06 (×23): 7 mL via OROMUCOSAL

## 2015-07-31 MED ORDER — MIDAZOLAM HCL 2 MG/2ML IJ SOLN
1.0000 mg | Freq: Once | INTRAMUSCULAR | Status: AC
  Administered 2015-07-31: 1 mg via INTRAVENOUS

## 2015-07-31 NOTE — Progress Notes (Addendum)
Initial Nutrition Assessment  DOCUMENTATION CODES:   Not applicable  INTERVENTION:  - Will order Vital AF 1.2 @ 60 mL/hr with 50 mL free water every 6 hours which will provide 1728 kcal, 108 grams of protein, and 1368 mL free water. (TF can be started at goal rate). - PEPup Protocol. - RD will follow-up 6/23.  NUTRITION DIAGNOSIS:   Inadequate oral intake related to inability to eat as evidenced by NPO status.   GOAL:   Patient will meet greater than or equal to 90% of their needs  MONITOR:   Vent status, TF tolerance, Weight trends, Labs, Skin, I & O's  REASON FOR ASSESSMENT:   Ventilator, Consult Enteral/tube feeding initiation and management  ASSESSMENT:   70 y.o. female with PMH as outlined below including COPD on chronic 2 - 3 L home O2 and '10mg'$  prednisone (followed by [name]) and stage II B NSCLC s/p XRT (followed by Dr. Earlie Server). She had recent admission 07/15/15 through 07/18/15 for CAP and AECOPD.   Pt seen for new vent and TF consult. BMI indicates overweight status. Pt with OGT in place. No family or visitors at bedside. Pt with hx of stage IIB non-small cell lung cancer and was on radiation therapy.   Patient is currently intubated on ventilator support MV: 11.1 L/min Temp (24hrs), Avg:99.3 F (37.4 C), Min:97.5 F (36.4 C), Max:100.2 F (37.9 C) Propofol: none  Pt has been agitated this AM and bilateral mitten restraints in place; unable to perform physical assessment at this time. Per chart review, current weight consistent with weight from March 2017. Pt has had some fluctuations in weight (163-177 lbs) since that time.   TF order as outlined above to meet needs. Medications reviewed. Labs reviewed; Ca: 7.7 mg/dL. IVF: NS @ 100 mL/hr.  Drips: Fentanyl @ 200 mcg/hr, Precedex @ 0.3 mcg/kg/hr.    Diet Order:  Diet NPO time specified  Skin:  Reviewed, no issues  Last BM:  PTA  Height:   Ht Readings from Last 1 Encounters:  07/31/15 '5\' 5"'$   (1.651 m)    Weight:   Wt Readings from Last 1 Encounters:  07/31/15 173 lb 10.9 oz (78.78 kg)    Ideal Body Weight:  56.82 kg (kg)  BMI:  Body mass index is 28.9 kg/(m^2).  Estimated Nutritional Needs:   Kcal:  1732  Protein:  95-118 grams (1.2-1.5 grams/kg)  Fluid:  1.8 L/day  EDUCATION NEEDS:   No education needs identified at this time     Jarome Matin, MS, RD, LDN Inpatient Clinical Dietitian Pager # [phone] After hours/weekend pager # [phone]

## 2015-07-31 NOTE — Progress Notes (Signed)
PULMONARY / CRITICAL CARE MEDICINE   Name: Kelsey Garcia MRN: [id number] DOB: [birth year]    ADMISSION DATE:  07/30/2015 CONSULTATION DATE:  07/30/15  REFERRING MD:  EDP  CHIEF COMPLAINT:  AMS  HISTORY OF PRESENT ILLNESS:  Pt is encephelopathic; therefore, this HPI is obtained from chart review. ELZORA Garcia is a 70 y.o. female with PMH as outlined below including COPD on chronic 2 - 3 L home O2 and '10mg'$  prednisone (followed by [name]) and stage II B NSCLC s/p XRT (followed by Dr. Earlie Server).  She had recent admission 07/15/15 through 07/18/15 for CAP and AECOPD.  Re-admitted w/ working dx on 6/20 w/ Acute on Chronic resp failure in setting of AECOPD vs early HCAP   SUBJECTIVE: On vent, at times very agitated  VITAL SIGNS: BP 124/70 mmHg  Pulse 124  Temp(Src) 99.5 F (37.5 C) (Oral)  Resp 11  Ht '5\' 5"'$  (1.651 m)  Wt 78.78 kg (173 lb 10.9 oz)  BMI 28.90 kg/m2  SpO2 98%  HEMODYNAMICS:    VENTILATOR SETTINGS: Vent Mode:  [-] PRVC FiO2 (%):  [40 %-50 %] 40 % Set Rate:  [18 bmp-24 bmp] 24 bmp Vt Set:  [450 mL] 450 mL PEEP:  [5 cmH20] 5 cmH20 Plateau Pressure:  [12 cmH20-20 cmH20] 18 cmH20  INTAKE / OUTPUT: I/O last 3 completed shifts: In: 2753.6 [I.V.:2043.6; Other:410; IV Piggyback:300] Out: 950 [Urine:950]  PHYSICAL EXAMINATION: General: Elderly, chronically ill appearing, agitated at times Neuro: Sedated, not responsive. HEENT: /AT,  Anicteric. Orally intubated Cardiovascular: RRR, no M/R/G.  Lungs: Respirations even, tight and prolonged expiratory wheeze  Abdomen: BS x 4, soft, NT/ND.  Musculoskeletal: No gross deformities, no edema.  Skin: Intact, warm, no rashes.  LABS:  BMET  Recent Labs Lab 07/30/15 1442 07/31/15 0318  NA 139 141  K 4.6 4.0  CL 94* 106  CO2 37* 29  BUN 21* 19  CREATININE 1.16* 0.61  GLUCOSE 99 159*    Electrolytes  Recent Labs Lab 07/30/15 1442 07/31/15 0318  CALCIUM 7.9* 7.7*  MG  --  1.7  PHOS  --  3.2     CBC  Recent Labs Lab 07/30/15 1442 07/31/15 0318  WBC 9.5 5.4  HGB 10.2* 8.4*  HCT 35.9* 28.7*  PLT 233 199    Coag's No results for input(s): APTT, INR in the last 168 hours.  Sepsis Markers  Recent Labs Lab 07/30/15 1530  LATICACIDVEN 0.62    ABG  Recent Labs Lab 07/30/15 1536 07/30/15 1802 07/31/15 0435  PHART 7.205* 7.230* 7.237*  PCO2ART 85.2* 76.9* 68.5*  PO2ART 105* 112* 80.2    Liver Enzymes  Recent Labs Lab 07/30/15 1442  AST 11*  ALT 11*  ALKPHOS 75  BILITOT 0.5  ALBUMIN 3.5    Cardiac Enzymes No results for input(s): TROPONINI, PROBNP in the last 168 hours.  Glucose  Recent Labs Lab 07/30/15 2102 07/31/15 0152 07/31/15 0840  GLUCAP 160* 160* 178*    Imaging Dg Abd 1 View  07/30/2015  CLINICAL DATA:  NG tube placement. EXAM: ABDOMEN - 1 VIEW COMPARISON:  None FINDINGS: The NG tube tip is curved back on itself. The tip is in the fundal region of the stomach. Numerous gallstones are noted in the gallbladder. IMPRESSION: The NG tube tip is in the fundal region of the stomach. Electronically Signed   By: Marijo Sanes M.D.   On: 07/30/2015 21:06   Dg Chest Port 1 View  07/31/2015  CLINICAL DATA:  Respiratory failure, intubated patient, history of COPD, cardiomyopathy, CHF, former smoker. EXAM: PORTABLE CHEST 1 VIEW COMPARISON:  Portable chest x-ray of July 30, 2015 FINDINGS: Positioning was difficult due to the patient's agitation. The patient is rotated toward the right. The lungs are borderline hypoinflated. There is no alveolar infiltrate. The interstitial markings are coarse. The cardiac silhouette is enlarged. The pulmonary vascularity is not clearly engorged. The endotracheal tube tip lies 2.9 cm above the carina. The esophagogastric tube tip projects below the inferior margin of the image. IMPRESSION: Allowing for differences in positioning there has not been significant interval change since yesterday's study. Mild CHF is suspected.  No pneumonia is observed. Electronically Signed   By: David  Martinique M.D.   On: 07/31/2015 07:27   Dg Chest Portable 1 View  07/30/2015  CLINICAL DATA:  Evaluate endotracheal tube placement. EXAM: PORTABLE CHEST 1 VIEW COMPARISON:  07/30/2015 FINDINGS: Endotracheal tube is approximately 1.0 cm above the carina. Again noted are coarse lung markings which appear to be chronic. Difficult to exclude pulmonary edema. Heart size is enlarged and stable. Negative for a pneumothorax. No acute bone abnormality. Mild volume loss in the left hemithorax appears unchanged. IMPRESSION: Endotracheal tube is positioned just above the carina. Chronic lung changes and difficult to exclude interstitial edema. Electronically Signed   By: Markus Daft M.D.   On: 07/30/2015 17:29   Dg Chest Port 1 View  07/30/2015  CLINICAL DATA:  Altered level of consciousness, worsening over the past 24 hours. EXAM: PORTABLE CHEST 1 VIEW COMPARISON:  07/15/2015 FINDINGS: The heart is mildly enlarged but stable. Stable tortuosity, ectasia and calcification of the thoracic aorta. Stable eventration of the right hemidiaphragm. Chronic bronchitic type lung changes but no definite acute overlying pulmonary findings. The bony thorax is grossly intact. IMPRESSION: Chronic changes.  No acute findings. Stable aortic atherosclerosis. Electronically Signed   By: Marijo Sanes M.D.   On: 07/30/2015 15:52     STUDIES:  CXR 06/20 > no acute process. cxr 6/21: no sig change; rotated   CULTURES: Blood 06/20 > Sputum 06/20 >  ANTIBIOTICS: Vanc 06/20 > Cefepime 06/20 >  SIGNIFICANT EVENTS: 06/20 > admitted with AECOPD and concern for HCAP.  LINES/TUBES: ETT 06/20 >  DISCUSSION: 70 y.o. F with hx O2 and steroid dependent COPD and stage II B NSCLC s/p XRT, admitte 06/20 with AECOPD.  Failed BiPAP and was intubated in ED.Still w/ sig bronchospasm & not ready for extubation. Will cont current plan of care as outlined Think should improve over the  next day or so.   ASSESSMENT / PLAN:  PULMONARY A: Acute on chronic hypoxemic and hypercapnic respiratory failure - required intubation in ED (though note, pt formerly listed as DNR). AECOPD. At risk for HCAP. Stage IIB non-small cell lung cancer - s/p XRT (followed by Dr. Julien Nordmann). P:   Full vent support. Wean as able. VAP prevention measures. SBT in AM if able. DuoNebs / Albuterol. Solumedrol. Abx per ID section. CXR in AM.  CARDIOVASCULAR A:  Hx CAD, sCHF (EF 50-55% per echo from Jan 2016), HTN. P:  Monitor hemodynamics. Continue outpatient ASA. Resume  diltiazem, but cont to hold furosemide, toprol-xl.  RENAL A:   AKI-->resolved  Hypocalcemia. P:   Cont NS, but decrease to 50cc/hr BMP in AM.  GASTROINTESTINAL A:   GERD. Nutrition. P:   Famotidine. NPO. Start TFs  HEMATOLOGIC / ONCOLOGIC A:   VTE Prophylaxis. Stage IIB non-small cell lung cancer - s/p XRT (followed by Dr.  Mohamed). P:  SCD's / Heparin. CBC in AM. Follow up as outpatient with Dr. Julien Nordmann.  INFECTIOUS A:   AECOPD. At risk for HCAP. P:  Abx as above (Vanc / Zosyn).  Follow cultures as above.  ENDOCRINE A:   No acute issues. P:   No interventions required.  NEUROLOGIC A:   Acute hypercarbic encephalopathy. Hx anxiety. P:   Sedation:  Fentanyl gtt / add precedex in hopes for transition to weaning in am 6/22 RASS goal: 0 to -1. Daily WUA. Add out-pt buspirone, diazepam hold norco.  Family updated: None.  Interdisciplinary Family Meeting v Palliative Care Meeting:  Due by: 08/05/15.  During last admission 07/15/15, pt verbally requested DNR after discussion with admitting team per H&P notes.  67 07/31/2015, 8:49 AM

## 2015-08-01 ENCOUNTER — Inpatient Hospital Stay (HOSPITAL_COMMUNITY): Payer: Medicare Other

## 2015-08-01 DIAGNOSIS — J9601 Acute respiratory failure with hypoxia: Secondary | ICD-10-CM

## 2015-08-01 LAB — BASIC METABOLIC PANEL
ANION GAP: 7 (ref 5–15)
BUN: 28 mg/dL — ABNORMAL HIGH (ref 6–20)
CALCIUM: 8.8 mg/dL — AB (ref 8.9–10.3)
CO2: 27 mmol/L (ref 22–32)
Chloride: 105 mmol/L (ref 101–111)
Creatinine, Ser: 0.69 mg/dL (ref 0.44–1.00)
Glucose, Bld: 191 mg/dL — ABNORMAL HIGH (ref 65–99)
Potassium: 4.2 mmol/L (ref 3.5–5.1)
Sodium: 139 mmol/L (ref 135–145)

## 2015-08-01 LAB — GLUCOSE, CAPILLARY
GLUCOSE-CAPILLARY: 186 mg/dL — AB (ref 65–99)
GLUCOSE-CAPILLARY: 195 mg/dL — AB (ref 65–99)
GLUCOSE-CAPILLARY: 203 mg/dL — AB (ref 65–99)
Glucose-Capillary: 184 mg/dL — ABNORMAL HIGH (ref 65–99)
Glucose-Capillary: 184 mg/dL — ABNORMAL HIGH (ref 65–99)
Glucose-Capillary: 224 mg/dL — ABNORMAL HIGH (ref 65–99)
Glucose-Capillary: 158 mg/dL — ABNORMAL HIGH (ref 65–99)
Glucose-Capillary: 129 mg/dL — ABNORMAL HIGH (ref 65–99)

## 2015-08-01 LAB — CBC
HCT: 30.6 % — ABNORMAL LOW (ref 36.0–46.0)
Hemoglobin: 9.1 g/dL — ABNORMAL LOW (ref 12.0–15.0)
MCH: 26.1 pg (ref 26.0–34.0)
MCHC: 29.7 g/dL — ABNORMAL LOW (ref 30.0–36.0)
MCV: 87.7 fL (ref 78.0–100.0)
PLATELETS: 212 10*3/uL (ref 150–400)
RBC: 3.49 MIL/uL — ABNORMAL LOW (ref 3.87–5.11)
RDW: 19.2 % — AB (ref 11.5–15.5)
WBC: 7.9 10*3/uL (ref 4.0–10.5)

## 2015-08-01 LAB — MAGNESIUM: Magnesium: 2.1 mg/dL (ref 1.7–2.4)

## 2015-08-01 LAB — BLOOD GAS, ARTERIAL
Acid-Base Excess: 2.8 mmol/L — ABNORMAL HIGH (ref 0.0–2.0)
BICARBONATE: 28 meq/L — AB (ref 20.0–24.0)
Drawn by: 308601
FIO2: 0.3
LHR: 24 {breaths}/min
O2 Saturation: 88.3 %
PEEP: 5 cmH2O
PO2 ART: 58.1 mmHg — AB (ref 80.0–100.0)
Patient temperature: 98.6
TCO2: 26.6 mmol/L (ref 0–100)
VT: 450 mL
pCO2 arterial: 49.5 mmHg — ABNORMAL HIGH (ref 35.0–45.0)
pH, Arterial: 7.371 (ref 7.350–7.450)

## 2015-08-01 LAB — URINE CULTURE

## 2015-08-01 LAB — PHOSPHORUS: Phosphorus: 3 mg/dL (ref 2.5–4.6)

## 2015-08-01 MED ORDER — METHYLPREDNISOLONE SODIUM SUCC 40 MG IJ SOLR
40.0000 mg | Freq: Two times a day (BID) | INTRAMUSCULAR | Status: DC
  Administered 2015-08-01 – 2015-08-08 (×14): 40 mg via INTRAVENOUS
  Filled 2015-08-01 (×15): qty 1

## 2015-08-01 MED ORDER — FUROSEMIDE 10 MG/ML IJ SOLN
40.0000 mg | Freq: Once | INTRAMUSCULAR | Status: AC
  Administered 2015-08-01: 40 mg via INTRAVENOUS
  Filled 2015-08-01: qty 4

## 2015-08-01 MED ORDER — LEVOFLOXACIN IN D5W 750 MG/150ML IV SOLN
750.0000 mg | INTRAVENOUS | Status: DC
  Administered 2015-08-01 – 2015-08-07 (×7): 750 mg via INTRAVENOUS
  Filled 2015-08-01 (×7): qty 150

## 2015-08-01 MED ORDER — METOPROLOL TARTRATE 5 MG/5ML IV SOLN
5.0000 mg | Freq: Four times a day (QID) | INTRAVENOUS | Status: DC
  Administered 2015-08-01 – 2015-08-02 (×3): 5 mg via INTRAVENOUS
  Filled 2015-08-01 (×3): qty 5

## 2015-08-01 NOTE — Progress Notes (Signed)
PULMONARY / CRITICAL CARE MEDICINE   Name: MATTINGLY FOUNTAINE MRN: [id number] DOB: October 07, 1945    ADMISSION DATE:  07/30/2015 CONSULTATION DATE:  07/30/15  REFERRING MD:  EDP  CHIEF COMPLAINT:  AMS  BRIEF:   LATOIA EYSTER is a 70 y.o. female with PMH as outlined below including COPD on chronic 2 - 3 L home O2 and '10mg'$  prednisone (followed by [name]) and stage II B NSCLC s/p XRT (followed by Dr. Earlie Server).  She had recent admission 07/15/15 through 07/18/15 for CAP and AECOPD.  Re-admitted w/ working dx on 6/20 w/ Acute on Chronic resp failure in setting of AECOPD vs early HCAP   SUBJECTIVE:  No acute events overnight   VITAL SIGNS: BP 110/53 mmHg  Pulse 86  Temp(Src) 99.1 F (37.3 C) (Oral)  Resp 27  Ht '5\' 5"'$  (1.651 m)  Wt 177 lb 0.5 oz (80.3 kg)  BMI 29.46 kg/m2  SpO2 98%  HEMODYNAMICS:    VENTILATOR SETTINGS: Vent Mode:  [-] PRVC FiO2 (%):  [30 %-40 %] 40 % Set Rate:  [24 bmp] 24 bmp Vt Set:  [450 mL] 450 mL PEEP:  [5 cmH20] 5 cmH20 Plateau Pressure:  [16 cmH20-18 cmH20] 17 cmH20  INTAKE / OUTPUT: I/O last 3 completed shifts: In: 6075.9 [I.V.:3681.9; Other:410; NG/GT:1034; IV Piggyback:950] Out: 0932 [Urine:1675]  PHYSICAL EXAMINATION: General: Elderly, chronically ill appearing, no distress Neuro: Sedated, on vent HEENT: NCAT,  ETT in place Cardiovascular: RRR, no mgr  Lungs: no wheezing for me today, normal effort  Abdomen:soft nontender Musculoskeletal: normal bulk and tone Skin: Intact, warm, no rashes  LABS:  BMET  Recent Labs Lab 07/30/15 1442 07/31/15 0318 08/01/15 0307  NA 139 141 139  K 4.6 4.0 4.2  CL 94* 106 105  CO2 37* 29 27  BUN 21* 19 28*  CREATININE 1.16* 0.61 0.69  GLUCOSE 99 159* 191*    Electrolytes  Recent Labs Lab 07/30/15 1442 07/31/15 0318 07/31/15 1059 08/01/15 0307  CALCIUM 7.9* 7.7*  --  8.8*  MG  --  1.7 1.8 2.1  PHOS  --  3.2 3.0 3.0    CBC  Recent Labs Lab 07/30/15 1442 07/31/15 0318 08/01/15 0307   WBC 9.5 5.4 7.9  HGB 10.2* 8.4* 9.1*  HCT 35.9* 28.7* 30.6*  PLT 233 199 212    Coag's No results for input(s): APTT, INR in the last 168 hours.  Sepsis Markers  Recent Labs Lab 07/30/15 1530  LATICACIDVEN 0.62    ABG  Recent Labs Lab 07/30/15 1802 07/31/15 0435 08/01/15 0348  PHART 7.230* 7.237* 7.371  PCO2ART 76.9* 68.5* 49.5*  PO2ART 112* 80.2 58.1*    Liver Enzymes  Recent Labs Lab 07/30/15 1442  AST 11*  ALT 11*  ALKPHOS 75  BILITOT 0.5  ALBUMIN 3.5    Cardiac Enzymes No results for input(s): TROPONINI, PROBNP in the last 168 hours.  Glucose  Recent Labs Lab 07/31/15 1535 07/31/15 1937 07/31/15 2318 08/01/15 0031 08/01/15 0309 08/01/15 0800  GLUCAP 134* 186* 184* 203* 184* 195*    Imaging Dg Chest Port 1 View  08/01/2015  CLINICAL DATA:  Respiratory failure. EXAM: PORTABLE CHEST 1 VIEW COMPARISON:  07/31/2015.  CT 07/15/2015. FINDINGS: Endotracheal tube, NG tube in stable position. Cardiomegaly. Bibasilar atelectasis and/or infiltrates, right side greater than left, again noted. No pleural effusion or pneumothorax. IMPRESSION: 1. Lines and tubes in stable position. 2. Persistent bibasilar atelectasis and or infiltrates, right side greater than left. Electronically Signed  By: [place]   On: 08/01/2015 07:01     STUDIES:  CXR 06/20 > no acute process. cxr 6/21: no sig change; rotated   CULTURES: Blood 06/20 > Sputum 06/20 > Urine 6/20 > multiple species  ANTIBIOTICS: Vanc 06/20 > 6/22 Cefepime 06/20 > 6/22 Levaquin 6/22 >   SIGNIFICANT EVENTS: 06/20 > admitted with AECOPD   LINES/TUBES: ETT 06/20 >  DISCUSSION: 70 y.o. F with hx O2 and steroid dependent COPD and stage II B NSCLC s/p XRT, admitte 06/20 with AECOPD.  Failed BiPAP and was intubated in ED. Bronchospasm improved by 6/22.  ASSESSMENT / PLAN:  PULMONARY A: Acute on chronic hypoxemic and hypercapnic respiratory failure - required intubation in ED (though  note, pt formerly listed as DNR) AECOPD > improving Doubt HCAP Stage IIB non-small cell lung cancer - s/p XRT (followed by Dr. Julien Nordmann) P:   WUA/SBT today VAP prevention Continue full vent support Wean solumedrol Continue bronchodilators  CARDIOVASCULAR A:  Hx CAD, sCHF (EF 50-55% per echo from Jan 2016), HTN P:  Tele Continue outpatient ASA Continue diltiazem Add back toprol and lasix, both IV today, adjust based on UOP and BP response  RENAL A:   No acute issues P:   KVO fluids Monitor BMET and UOP Replace electrolytes as needed  GASTROINTESTINAL A:   GERD P:   Famotidine for stress ulcer prophylaxis Continue TFs  HEMATOLOGIC / ONCOLOGIC A:   VTE Prophylaxis Stage IIB non-small cell lung cancer - s/p XRT (followed by Dr. Julien Nordmann) P:  SCD's / Heparin CBC in AM Follow up as outpatient with Dr. Julien Nordmann  INFECTIOUS A:   AECOPD No evidence of HCAP P:  Stop Vanc/Zosyn Start Levaquin in setting of COPD exacerbation Follow cultures as above  ENDOCRINE A:   No acute issues. P:   No interventions required.  NEUROLOGIC A:   Acute hypercarbic encephalopathy Hx anxiety P:   Sedation:  Fentanyl gtt /  precedex RASS goal: 0 to -1 Daily WUA Continue out-pt buspirone, diazepam   Family updated: Daughter updated 6/22 bedside  Interdisciplinary Family Meeting v Palliative Care Meeting:  Due by: 08/05/15.  During last admission 07/15/15, pt verbally requested DNR after discussion with admitting team per H&P notes.  68 08/01/2015, 11:24 AM  My cc time 35 minutes  Roselie Awkward, MD [place] [facility] Pager: [phone] Cell: [phone] After 3pm or if no response, call [phone]

## 2015-08-01 NOTE — Progress Notes (Signed)
Attempted to wean patient from sedation. Patient became extremely agitated and restless and 2 mg of versed were administered. Patient's sedation was turned back on in order to combat restlessness. RASS score was a +3 when trying to wean.

## 2015-08-01 NOTE — Progress Notes (Signed)
PT Cancellation Note  Patient Details Name: Kelsey Garcia MRN: [id number] DOB: [birth year]   Cancelled Treatment:     PT order received but eval deferred 2* pt on vent and sedated 2* agitation.  Will follow.   Shiva Karis 08/01/2015, 1:24 PM

## 2015-08-01 NOTE — Progress Notes (Signed)
[facility]  Daughter updated bedside.  I explained I think her mother should have never been intubated in the first place, but that now that we are in this position we should try to get her off the vent in the next 2-3 days.  She agreed with this plan and stated she would not want her to have prolonged ventilation.  Roselie Awkward, MD [facility] Pager: [phone] Cell: [phone] After 3pm or if no response, call [phone]

## 2015-08-01 NOTE — Progress Notes (Signed)
[place] Progress Note Patient Name: SHALANDRA LEU DOB: 04-Feb-1946 MRN: [id number]   Date of Service  08/01/2015  HPI/Events of Note  Agitation - Currently on Precedex and Fentanyl IV infusions.  eICU Interventions  Will order: 1. Increase ceiling on Precedex IV infusion to 1.5 mcg/kg/hour.     Intervention Category Minor Interventions: Agitation / anxiety - evaluation and management  Lysle Dingwall 08/01/2015, 8:49 PM

## 2015-08-01 NOTE — Progress Notes (Signed)
CSW consulted for SNF placement. PN reviewed.Pt is presently on vent. MD hoping to extubate in the next 2-3 days. CSW will assist with d/c planning once pt is extubated.  Werner Lean LCSW [phone]

## 2015-08-01 NOTE — Progress Notes (Signed)
Pharmacy Antibiotic Note - Follow Up  Kelsey Garcia is a 70 y.o. female admitted on 07/30/2015 with pneumonia.  Recently admitted 6/5-6/8 for AECOPD/CAP.  Pharmacy has been consulted for Vancomycin and Cefepime dosing on 6/20 for HAP  Plan: 1) Continue vancomycin 1g IV q12h. 2) Will check trough prior to next vanc dose at 4pm (prior to 5th total dose) 3) Continue Cefepime 1g IV q8h. 4) F/u SCr, trough levels as indicated, clinical course.  Height: '5\' 5"'$  (165.1 cm) Weight: 177 lb 0.5 oz (80.3 kg) IBW/kg (Calculated) : 57  Temp (24hrs), Avg:99 F (37.2 C), Min:98.3 F (36.8 C), Max:99.5 F (37.5 C)   Recent Labs Lab 07/30/15 1442 07/30/15 1530 07/31/15 0318 08/01/15 0307  WBC 9.5  --  5.4 7.9  CREATININE 1.16*  --  0.61 0.69  LATICACIDVEN  --  0.62  --   --     Estimated Creatinine Clearance: 70.4 mL/min (by C-G formula based on Cr of 0.69).    Allergies  Allergen Reactions  . Ace Inhibitors Cough  . Codeine Swelling    Face and lips swelling  Patient report Can tolerate hydrocodone  . Pseudoeph-Doxylamine-Dm-Apap Itching  . Diphenhydramine Hcl Other (See Comments)    "feels like my skin is crawling, and legs twitches"  . Erythromycin Diarrhea  . Nsaids Nausea And Vomiting  . Nyquil [Pseudoeph-Doxylamine-Dm-Apap] Itching  . Tramadol Hcl Other (See Comments)    stomach pain, hallucinations  . Fosamax [Alendronate] Other (See Comments)    Nausea and abdominal bloating.   . Tomato Other (See Comments)    Stomach swells  . Orange Fruit [Citrus] Rash  . Pseudoephedrine Palpitations    Antimicrobials this admission: 6/20 Vancomycin >>  6/20 Cefepime >>   Dose adjustments this admission: -  Microbiology results: 6/20 BCx: ngtd 6/20 UCx: multiple - suggest recollect 6/20 MRSA PCR: positive   Thank you for allowing pharmacy to be a part of this patient's care.   Adrian Saran, PharmD, BCPS Pager [phone] 08/01/2015 9:44 AM

## 2015-08-02 ENCOUNTER — Inpatient Hospital Stay (HOSPITAL_COMMUNITY): Payer: Medicare Other

## 2015-08-02 DIAGNOSIS — J9622 Acute and chronic respiratory failure with hypercapnia: Secondary | ICD-10-CM

## 2015-08-02 DIAGNOSIS — J9621 Acute and chronic respiratory failure with hypoxia: Secondary | ICD-10-CM

## 2015-08-02 LAB — BASIC METABOLIC PANEL
ANION GAP: 7 (ref 5–15)
BUN: 30 mg/dL — ABNORMAL HIGH (ref 6–20)
CALCIUM: 8.7 mg/dL — AB (ref 8.9–10.3)
CHLORIDE: 102 mmol/L (ref 101–111)
CO2: 29 mmol/L (ref 22–32)
Creatinine, Ser: 0.55 mg/dL (ref 0.44–1.00)
GFR calc non Af Amer: >60 mL/min (ref >60)
Glucose, Bld: 168 mg/dL — ABNORMAL HIGH (ref 65–99)
Potassium: 4.1 mmol/L (ref 3.5–5.1)
Sodium: 138 mmol/L (ref 135–145)

## 2015-08-02 LAB — CBC WITH DIFFERENTIAL/PLATELET
BASOS PCT: 0 %
Basophils Absolute: 0 10*3/uL (ref 0.0–0.1)
Eosinophils Absolute: 0 10*3/uL (ref 0.0–0.7)
Eosinophils Relative: 0 %
HEMATOCRIT: 30.5 % — AB (ref 36.0–46.0)
HEMOGLOBIN: 9.1 g/dL — AB (ref 12.0–15.0)
LYMPHS ABS: 0.2 10*3/uL — AB (ref 0.7–4.0)
Lymphocytes Relative: 1 %
MCH: 25.9 pg — ABNORMAL LOW (ref 26.0–34.0)
MCHC: 29.8 g/dL — AB (ref 30.0–36.0)
MCV: 86.9 fL (ref 78.0–100.0)
MONOS PCT: 5 %
Monocytes Absolute: 0.6 10*3/uL (ref 0.1–1.0)
NEUTROS ABS: 11.3 10*3/uL — AB (ref 1.7–7.7)
NEUTROS PCT: 94 %
Platelets: 174 10*3/uL (ref 150–400)
RBC: 3.51 MIL/uL — AB (ref 3.87–5.11)
RDW: 19.5 % — ABNORMAL HIGH (ref 11.5–15.5)
WBC: 12.1 10*3/uL — ABNORMAL HIGH (ref 4.0–10.5)

## 2015-08-02 LAB — GLUCOSE, CAPILLARY
GLUCOSE-CAPILLARY: 151 mg/dL — AB (ref 65–99)
GLUCOSE-CAPILLARY: 165 mg/dL — AB (ref 65–99)
GLUCOSE-CAPILLARY: 168 mg/dL — AB (ref 65–99)
GLUCOSE-CAPILLARY: 205 mg/dL — AB (ref 65–99)
GLUCOSE-CAPILLARY: 94 mg/dL (ref 65–99)
Glucose-Capillary: 160 mg/dL — ABNORMAL HIGH (ref 65–99)

## 2015-08-02 MED ORDER — METOPROLOL TARTRATE 25 MG/10 ML ORAL SUSPENSION
12.5000 mg | Freq: Two times a day (BID) | ORAL | Status: DC
  Administered 2015-08-02 – 2015-08-05 (×7): 12.5 mg
  Filled 2015-08-02 (×10): qty 5

## 2015-08-02 MED ORDER — VITAL AF 1.2 CAL PO LIQD
1000.0000 mL | ORAL | Status: DC
  Administered 2015-08-02 – 2015-08-04 (×4): 1000 mL
  Filled 2015-08-02 (×4): qty 1000

## 2015-08-02 MED ORDER — FUROSEMIDE 10 MG/ML IJ SOLN
40.0000 mg | Freq: Once | INTRAMUSCULAR | Status: AC
  Administered 2015-08-02: 40 mg via INTRAVENOUS
  Filled 2015-08-02: qty 4

## 2015-08-02 MED ORDER — DILTIAZEM 12 MG/ML ORAL SUSPENSION
120.0000 mg | Freq: Three times a day (TID) | ORAL | Status: DC
  Administered 2015-08-02 – 2015-08-05 (×10): 120 mg
  Filled 2015-08-02 (×12): qty 12

## 2015-08-02 MED ORDER — PANTOPRAZOLE SODIUM 40 MG PO PACK
40.0000 mg | PACK | ORAL | Status: DC
  Administered 2015-08-02 – 2015-08-05 (×4): 40 mg
  Filled 2015-08-02 (×4): qty 20

## 2015-08-02 NOTE — Progress Notes (Signed)
PT Cancellation Note  Patient Details Name: Kelsey Garcia MRN: [id number] DOB: 12-Jan-1946   Cancelled Treatment:    Reason Eval/Treat Not Completed: Medical issues which prohibited therapy Pt becoming agitated with attempts to wean sedation and remains on vent.  Please re-order when pt is medically ready. Thanks   Russ Looper,KATHrine E 08/02/2015, 8:20 AM Carmelia Bake, PT, DPT 08/02/2015 Pager: [phone]

## 2015-08-02 NOTE — Progress Notes (Signed)
In preparation for wake up assessment, attempt was made to gradually decrease sedation medications early this morning. At 0500, pt became extremely agitated and combative, hitting, kicking, and attempting to get out of bed and pulling lines/tubes.  Sedation medications increased and '1mg'$  versed given.

## 2015-08-02 NOTE — Progress Notes (Signed)
While doing assessment, patient became very agitated and irritable. Patient began thrashing and her foley catheter was pulled out by her feet. Balloon was intact and inflated. Will continue to monitor.

## 2015-08-02 NOTE — Progress Notes (Signed)
Attempted sedation wean today. Fentanyl got down to 125 and Precedex down to 0.8. Patient became very agitated and was combative. Patient was not able to go down anymore on sedation and sedation had to be increased. Will continue to monitor.

## 2015-08-02 NOTE — Progress Notes (Signed)
Nutrition Follow-up  DOCUMENTATION CODES:   Not applicable  INTERVENTION:  - Will decrease TF goal rate: Vital 1.2 @ 55 mL/hr which will provide 1584 kcal (97% estimated kcal need), 99 grams of protein, and 1070 mL free water.  - Continue 50 mL free water every 6 hours.  - Continue PEPuP protocol.  - RD will follow-up 6/26  NUTRITION DIAGNOSIS:   Inadequate oral intake related to inability to eat as evidenced by NPO status. -ongoing  GOAL:   Patient will meet greater than or equal to 90% of their needs -met with current TF regimen.  MONITOR:   Vent status, TF tolerance, Weight trends, Labs, Skin, I & O's  ASSESSMENT:   70 y.o. female with PMH as outlined below including COPD on chronic 2 - 3 L home O2 and 40m prednisone (followed by Dr. AElsworth Soho and stage II B NSCLC s/p XRT (followed by Dr. MEarlie Server. She had recent admission 07/15/15 through 07/18/15 for CAP and AECOPD.   6/23 Pt continues with OGT in place. No family or visitors present again this AM, but sitter at bedside. Sitter reports that pt continues to have some agitation when touched and that pt previously pulled out Foley; bilateral mitten restraints remain in place. Continue to be unable to perform physical assessment but no muscle or fat wasting visible to upper body. Nutrition needs re-estimated based on current Tmax and Ve and admission weight (77.11 kg) used in calculation as weight has been trending up.   Patient is currently intubated on ventilator support MV: 7.1 L/min Temp (24hrs), Avg:99.6 F (37.6 C), Min:98.2 F (36.8 C), Max:100.6 F (38.1 C) Propofol: none  Pt currently receiving Vital 1.2 @ 60 mL/hr with 50 mL free water every 6 hours. This regimen is providing 1728 kcal, 108 grams of protein, and 1368 mL free water. Will change TF regimen as outlined above. Medications reviewed; 40 mg IV Lasix x1 dose today, sliding scale Novolog, 40 mg IV Solu-medrol every 12 hours. Labs reviewed; CBGs: 151-205 mg/dL  today, BUN: 30 mg/dL, Ca: 8.7 mg/dL.     Drips: Fentanyl @ 200 mcg/hr, Precedex @ 1 mcg/kg/hr.     6/21 - Pt with OGT in place. No family or visitors at bedside.  - Pt with hx of stage IIB non-small cell lung cancer and was on radiation therapy.  - Patient is currently intubated on ventilator support; MV: 11.1 L/min; Temp (24hrs), Max:100.2 F (37.9 C); Propofol: none - Pt has been agitated this AM and bilateral mitten restraints in place; unable to perform physical assessment.  - Per chart review, current weight consistent with weight from March 2017.  - Pt has had some fluctuations in weight (163-177 lbs) since that time.   IVF: NS @ 100 mL/hr.  Drips: Fentanyl @ 200 mcg/hr, Precedex @ 0.3 mcg/kg/hr.     Diet Order:  Diet NPO time specified  Skin:  Reviewed, no issues  Last BM:  PTA  Height:   Ht Readings from Last 1 Encounters:  07/31/15 5' 5"  (1.651 m)    Weight:   Wt Readings from Last 1 Encounters:  08/02/15 181 lb 7 oz (82.3 kg)    Ideal Body Weight:  56.82 kg (kg)  BMI:  Body mass index is 30.19 kg/(m^2).  Estimated Nutritional Needs:   Kcal:  1625  Protein:  93-116 grams (1.2-1.5 grams/kg)  Fluid:  1.8 L/day  EDUCATION NEEDS:   No education needs identified at this time     JJarome Matin MS, RD,  LDN Inpatient Clinical Dietitian Pager # [phone] After hours/weekend pager # [phone]

## 2015-08-02 NOTE — Progress Notes (Signed)
PULMONARY / CRITICAL CARE MEDICINE   Name: Kelsey Garcia MRN: [id number] DOB: 1945-07-16    ADMISSION DATE:  07/30/2015  CHIEF COMPLAINT:  AMS  BRIEF:   Kelsey Garcia is a 70 y.o. female with PMH as outlined below including COPD on chronic 2 - 3 L home O2 and '10mg'$  prednisone (followed by [name]) and stage II B NSCLC s/p XRT (followed by Dr. Earlie Server).  She had recent admission 07/15/15 through 07/18/15 for CAP and AECOPD.  Re-admitted w/ working dx on 6/20 w/ Acute on Chronic resp failure in setting of AECOPD vs early HCAP  SUBJECTIVE:  Tolerating pressure support.  VITAL SIGNS: BP 107/41 mmHg  Pulse 112  Temp(Src) 99.7 F (37.6 C) (Core (Comment))  Resp 18  Ht '5\' 5"'$  (1.651 m)  Wt 181 lb 7 oz (82.3 kg)  BMI 30.19 kg/m2  SpO2 100%  HEMODYNAMICS:    VENTILATOR SETTINGS: Vent Mode:  [-] PRVC FiO2 (%):  [35 %-40 %] 35 % Set Rate:  [24 bmp] 24 bmp Vt Set:  [450 mL] 450 mL PEEP:  [5 cmH20] 5 cmH20 Plateau Pressure:  [16 cmH20-19 cmH20] 16 cmH20  INTAKE / OUTPUT: I/O last 3 completed shifts: In: 4321.4 [I.V.:1621.4; Other:210; NG/GT:2190; IV Piggyback:300] Out: 2775 [Urine:2775]  PHYSICAL EXAMINATION: General: sedated Neuro: RASS -2 HEENT: ETT in place Cardiac: regular, no murmur Chest: no wheeze Abd: soft, non tender Ext: no edema Skin: no rashes  LABS:  BMET  Recent Labs Lab 07/31/15 0318 08/01/15 0307 08/02/15 0311  NA 141 139 138  K 4.0 4.2 4.1  CL 106 105 102  CO2 '29 27 29  '$ BUN 19 28* 30*  CREATININE 0.61 0.69 0.55  GLUCOSE 159* 191* 168*    Electrolytes  Recent Labs Lab 07/31/15 0318 07/31/15 1059 08/01/15 0307 08/02/15 0311  CALCIUM 7.7*  --  8.8* 8.7*  MG 1.7 1.8 2.1  --   PHOS 3.2 3.0 3.0  --     CBC  Recent Labs Lab 07/31/15 0318 08/01/15 0307 08/02/15 0311  WBC 5.4 7.9 12.1*  HGB 8.4* 9.1* 9.1*  HCT 28.7* 30.6* 30.5*  PLT 199 212 174    Coag's No results for input(s): APTT, INR in the last 168 hours.  Sepsis  Markers  Recent Labs Lab 07/30/15 1530  LATICACIDVEN 0.62    ABG  Recent Labs Lab 07/30/15 1802 07/31/15 0435 08/01/15 0348  PHART 7.230* 7.237* 7.371  PCO2ART 76.9* 68.5* 49.5*  PO2ART 112* 80.2 58.1*    Liver Enzymes  Recent Labs Lab 07/30/15 1442  AST 11*  ALT 11*  ALKPHOS 75  BILITOT 0.5  ALBUMIN 3.5    Cardiac Enzymes No results for input(s): TROPONINI, PROBNP in the last 168 hours.  Glucose  Recent Labs Lab 08/01/15 1140 08/01/15 1543 08/01/15 2006 08/02/15 0004 08/02/15 0428 08/02/15 0749  GLUCAP 224* 158* 129* 165* 205* 151*    Imaging Dg Chest Port 1 View  08/02/2015  CLINICAL DATA:  Respiratory failure. EXAM: PORTABLE CHEST 1 VIEW COMPARISON:  08/01/2015. FINDINGS: Endotracheal tube and NG tube in stable position. Cardiomegaly with normal pulmonary vascularity. Low lung volumes with mild basilar atelectasis, improved from prior exam. No pleural effusion or pneumothorax. IMPRESSION: 1.  Lines and tubes in stable position. 2. Low lung volumes with mild bibasilar atelectasis, improved from prior exam. 3.  Stable cardiomegaly . Electronically Signed   By: Marcello Moores  Register   On: 08/02/2015 07:15     STUDIES:  03/05/15 PFT >> FEV1 0.74 (  31%), FEV1% 40, TLC 3.68 (71%), DLCO 19%  CULTURES: Blood 06/20 > Urine 6/20 > multiple species  ANTIBIOTICS: Vanc 06/20 > 6/22 Cefepime 06/20 > 6/22 Levaquin 6/22 >   SIGNIFICANT EVENTS: 06/20 > admitted with AECOPD   LINES/TUBES: ETT 06/20 >  DISCUSSION: 70 yo female with dyspnea, altered mental status from AECOPD and acute on chronic hypoxic/hypercapnic respiratory failure.  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  ASSESSMENT / PLAN:  PULMONARY A: Acute on chronic hypoxic/hypercapnic respiratory failure 2nd to AECOPD. P:   Pressure support wean as tolerated F/u CXR Scheduled BDs Continue solumedrol  CARDIOVASCULAR A:  Hx CAD, chronic diastolic  CHF, HTN. P:  Continue ASA, cardizem, lopressor  RENAL A:   No acute issues. P:   KVO fluids Monitor BMET and UOP Replace electrolytes as needed  GASTROINTESTINAL A:   GERD. P:   Protonix for stress ulcer prophylaxis Continue TFs  HEMATOLOGIC / ONCOLOGIC A:   Anemia of critical illness and chronic disease. Stage IIB non-small cell lung cancer - s/p XRT (followed by Dr. Julien Nordmann). P:  SCD's / Heparin F/u CBC  INFECTIOUS A:   AECOPD. P:  Day 4 of Abx, currently on levaquin  ENDOCRINE A:   Steroid induced hyperglycemia. P:   SSI  NEUROLOGIC A:   Acute metabolic encephalopathy. Hx anxiety P:   RASS goal: 0 to -1 Continue out-pt buspirone, diazepam  CC time 32 minutes.  Chesley Mires, MD [facility] Pulmonary/Critical Care 08/02/2015, 9:17 AM Pager:  [phone] After 3pm call: [phone]

## 2015-08-02 NOTE — Progress Notes (Signed)
At 1900, pt extremely agitated, attempting to get out of bed and pull tubes/lines.  Precedex maxxed at 1.5mg and fentanyl at 350. '1mg'$  versed given.  Pt continued to be agitated. Elink MD notified and orders for precedex to increase up to 1.539m. Safety sitter also obtained.  Pt continues to have periods of extreme agitated, that are alleviated by '1mg'$  versed. Will continue to monitor.

## 2015-08-03 ENCOUNTER — Inpatient Hospital Stay (HOSPITAL_COMMUNITY): Payer: Medicare Other

## 2015-08-03 LAB — BLOOD GAS, ARTERIAL
Acid-Base Excess: 4.8 mmol/L — ABNORMAL HIGH (ref 0.0–2.0)
Bicarbonate: 30.8 mEq/L — ABNORMAL HIGH (ref 20.0–24.0)
FIO2: 0.3
O2 Saturation: 90.7 %
PCO2 ART: 56.3 mmHg — AB (ref 35.0–45.0)
PEEP: 5 cmH2O
Patient temperature: 98.6
Pressure support: 5 cmH2O
TCO2: 29 mmol/L (ref 0–100)
pH, Arterial: 7.357 (ref 7.350–7.450)
pO2, Arterial: 64.6 mmHg — ABNORMAL LOW (ref 80.0–100.0)

## 2015-08-03 LAB — CBC
HCT: 30.9 % — ABNORMAL LOW (ref 36.0–46.0)
Hemoglobin: 9.3 g/dL — ABNORMAL LOW (ref 12.0–15.0)
MCH: 25.8 pg — ABNORMAL LOW (ref 26.0–34.0)
MCHC: 30.1 g/dL (ref 30.0–36.0)
MCV: 85.6 fL (ref 78.0–100.0)
PLATELETS: 133 10*3/uL — AB (ref 150–400)
RBC: 3.61 MIL/uL — ABNORMAL LOW (ref 3.87–5.11)
RDW: 19.2 % — AB (ref 11.5–15.5)
WBC: 8.5 10*3/uL (ref 4.0–10.5)

## 2015-08-03 LAB — BASIC METABOLIC PANEL
ANION GAP: 6 (ref 5–15)
BUN: 30 mg/dL — ABNORMAL HIGH (ref 6–20)
CO2: 30 mmol/L (ref 22–32)
Calcium: 8.5 mg/dL — ABNORMAL LOW (ref 8.9–10.3)
Chloride: 101 mmol/L (ref 101–111)
Creatinine, Ser: 0.48 mg/dL (ref 0.44–1.00)
GLUCOSE: 209 mg/dL — AB (ref 65–99)
POTASSIUM: 4.1 mmol/L (ref 3.5–5.1)
Sodium: 137 mmol/L (ref 135–145)

## 2015-08-03 LAB — GLUCOSE, CAPILLARY
GLUCOSE-CAPILLARY: 190 mg/dL — AB (ref 65–99)
GLUCOSE-CAPILLARY: 190 mg/dL — AB (ref 65–99)
GLUCOSE-CAPILLARY: 223 mg/dL — AB (ref 65–99)
GLUCOSE-CAPILLARY: 174 mg/dL — AB (ref 65–99)
GLUCOSE-CAPILLARY: 132 mg/dL — AB (ref 65–99)
GLUCOSE-CAPILLARY: 134 mg/dL — AB (ref 65–99)

## 2015-08-03 MED ORDER — MIDAZOLAM HCL 2 MG/2ML IJ SOLN
1.0000 mg | INTRAMUSCULAR | Status: DC | PRN
  Administered 2015-08-03 – 2015-08-04 (×12): 2 mg via INTRAVENOUS
  Administered 2015-08-05 (×2): 1 mg via INTRAVENOUS
  Administered 2015-08-05: 2 mg via INTRAVENOUS
  Administered 2015-08-05 (×2): 1 mg via INTRAVENOUS
  Administered 2015-08-06 (×3): 2 mg via INTRAVENOUS
  Filled 2015-08-03 (×20): qty 2

## 2015-08-03 MED ORDER — MIDAZOLAM HCL 2 MG/2ML IJ SOLN
1.0000 mg | INTRAMUSCULAR | Status: DC | PRN
  Administered 2015-08-03 (×2): 2 mg via INTRAVENOUS
  Administered 2015-08-03: 1 mg via INTRAVENOUS
  Administered 2015-08-03: 2 mg via INTRAVENOUS
  Filled 2015-08-03 (×4): qty 2

## 2015-08-03 MED ORDER — MIDAZOLAM HCL 2 MG/2ML IJ SOLN
INTRAMUSCULAR | Status: AC
  Filled 2015-08-03: qty 2

## 2015-08-03 MED ORDER — DEXMEDETOMIDINE HCL IN NACL 400 MCG/100ML IV SOLN
0.4000 ug/kg/h | INTRAVENOUS | Status: AC
  Administered 2015-08-03: 1 ug/kg/h via INTRAVENOUS
  Administered 2015-08-03: 1.2 ug/kg/h via INTRAVENOUS
  Administered 2015-08-03 (×2): 1 ug/kg/h via INTRAVENOUS
  Administered 2015-08-03: 1.2 ug/kg/h via INTRAVENOUS
  Administered 2015-08-04: 0.9 ug/kg/h via INTRAVENOUS
  Administered 2015-08-04: 1.2 ug/kg/h via INTRAVENOUS
  Administered 2015-08-04 (×3): 1 ug/kg/h via INTRAVENOUS
  Administered 2015-08-05: 0.8 ug/kg/h via INTRAVENOUS
  Administered 2015-08-05: 1 ug/kg/h via INTRAVENOUS
  Administered 2015-08-05 (×2): 0.9 ug/kg/h via INTRAVENOUS
  Administered 2015-08-06: 1.1 ug/kg/h via INTRAVENOUS
  Filled 2015-08-03 (×4): qty 100
  Filled 2015-08-03: qty 50
  Filled 2015-08-03 (×9): qty 100

## 2015-08-03 NOTE — Progress Notes (Signed)
[place] Progress Note Patient Name: ARILLA HICE DOB: March 02, 1945 MRN: [id number]   Date of Service  08/03/2015  HPI/Events of Note  Agitation  eICU Interventions  Will increase Versed 1-2 mg IV PRN to Q 1 hour.      Intervention Category Minor Interventions: Agitation / anxiety - evaluation and management  Lysle Dingwall 08/03/2015, 8:57 PM

## 2015-08-03 NOTE — Progress Notes (Signed)
PULMONARY / CRITICAL CARE MEDICINE   Name: Kelsey Garcia MRN: [id number] DOB: Aug 17, 1945    ADMISSION DATE:  07/30/2015  CHIEF COMPLAINT:  AMS  BRIEF:   Kelsey Garcia is a 70 y.o. female with PMH as outlined below including COPD on chronic 2 - 3 L home O2 and '10mg'$  prednisone (followed by [name]) and stage II B NSCLC s/p XRT (followed by Dr. Earlie Server).  She had recent admission 07/15/15 through 07/18/15 for CAP and AECOPD.  Re-admitted w/ working dx on 6/20 w/ Acute on Chronic resp failure in setting of AECOPD vs early HCAP  SUBJECTIVE:  Agitated when drips lowered - versed seems to work better per RN afebrile  VITAL SIGNS: BP 105/67 mmHg  Pulse 89  Temp(Src) 98.1 F (36.7 C) (Axillary)  Resp 24  Ht '5\' 5"'$  (1.651 m)  Wt 186 lb 1.1 oz (84.4 kg)  BMI 30.96 kg/m2  SpO2 100%  HEMODYNAMICS:    VENTILATOR SETTINGS: Vent Mode:  [-] PRVC FiO2 (%):  [30 %-35 %] 30 % Set Rate:  [24 bmp] 24 bmp Vt Set:  [450 mL] 450 mL PEEP:  [5 cmH20] 5 cmH20 Pressure Support:  [10 cmH20] 10 cmH20 Plateau Pressure:  [15 cmH20-21 cmH20] 21 cmH20  INTAKE / OUTPUT: I/O last 3 completed shifts: In: 3351.4 [I.V.:1796.4; NG/GT:1555] Out: 3625 [Urine:3625]  PHYSICAL EXAMINATION: General: chr ill, intubated, sedated Neuro: RASS -2 , non focal HEENT: ETT in place Cardiac: regular, no murmur Chest: no wheeze Abd: soft, non tender Ext: no edema Skin: no rashes  LABS:  BMET  Recent Labs Lab 08/01/15 0307 08/02/15 0311 08/03/15 0308  NA 139 138 137  K 4.2 4.1 4.1  CL 105 102 101  CO2 '27 29 30  '$ BUN 28* 30* 30*  CREATININE 0.69 0.55 0.48  GLUCOSE 191* 168* 209*    Electrolytes  Recent Labs Lab 07/31/15 0318 07/31/15 1059 08/01/15 0307 08/02/15 0311 08/03/15 0308  CALCIUM 7.7*  --  8.8* 8.7* 8.5*  MG 1.7 1.8 2.1  --   --   PHOS 3.2 3.0 3.0  --   --     CBC  Recent Labs Lab 08/01/15 0307 08/02/15 0311 08/03/15 0308  WBC 7.9 12.1* 8.5  HGB 9.1* 9.1* 9.3*  HCT 30.6*  30.5* 30.9*  PLT 212 174 133*    Coag's No results for input(s): APTT, INR in the last 168 hours.  Sepsis Markers  Recent Labs Lab 07/30/15 1530  LATICACIDVEN 0.62    ABG  Recent Labs Lab 07/30/15 1802 07/31/15 0435 08/01/15 0348  PHART 7.230* 7.237* 7.371  PCO2ART 76.9* 68.5* 49.5*  PO2ART 112* 80.2 58.1*    Liver Enzymes  Recent Labs Lab 07/30/15 1442  AST 11*  ALT 11*  ALKPHOS 75  BILITOT 0.5  ALBUMIN 3.5    Cardiac Enzymes No results for input(s): TROPONINI, PROBNP in the last 168 hours.  Glucose  Recent Labs Lab 08/02/15 0749 08/02/15 1325 08/02/15 1703 08/02/15 2020 08/03/15 0429 08/03/15 0743  GLUCAP 151* 168* 94 160* 190* 223*    Imaging Dg Chest Port 1 View  08/03/2015  CLINICAL DATA:  Respiratory failure. History of AFib, hypertension, CHF, COPD. Former smoker. EXAM: PORTABLE CHEST 1 VIEW COMPARISON:  Chest x-rays dated 08/02/2015, 08/01/2015 and 02/04/2015. FINDINGS: Endotracheal tube is well positioned with tip just above the level of the carina. Enteric tube passes below the diaphragm. Cardiomediastinal silhouette is stable. Coarse interstitial markings are again seen throughout both lungs, stable compared to multiple  prior studies suggesting chronic interstitial lung disease, perhaps with mild superimposed edema. Small left pleural effusion and/or atelectasis. No new confluent airspace opacity to suggest a developing pneumonia. No pneumothorax. Gallstones are appreciated within the right upper abdomen. Osseous and soft tissue structures about the chest are otherwise unremarkable. IMPRESSION: 1. Stable cardiomegaly. 2. Chronic interstitial lung disease, with perhaps mild superimposed interstitial edema. 3. Small left pleural effusion and/or atelectasis. 4. Endotracheal tube and nasogastric tube remain well-positioned. Electronically Signed   By: Franki Cabot M.D.   On: 08/03/2015 07:49     STUDIES:  03/05/15 PFT >> FEV1 0.74 (31%), FEV1% 40,  TLC 3.68 (71%), DLCO 19%  CULTURES: Blood 06/20 > Urine 6/20 > multiple species  ANTIBIOTICS: Vanc 06/20 > 6/22 Cefepime 06/20 > 6/22 Levaquin 6/22 >   SIGNIFICANT EVENTS: 06/20 > admitted with AECOPD   LINES/TUBES: ETT 06/20 >  DISCUSSION: 70 yo female with dyspnea, altered mental status from AECOPD and acute on chronic hypoxic/hypercapnic respiratory failure.  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  ASSESSMENT / PLAN:  PULMONARY A: Acute on chronic hypoxic/hypercapnic respiratory failure 2nd to AECOPD. P:   Pressure support wean as tolerated Scheduled BDs Continue solumedrol  CARDIOVASCULAR A:  Hx CAD, chronic diastolic CHF, HTN. P:  Continue ASA, cardizem, lopressor  RENAL A:   No acute issues. P:   KVO fluids Monitor BMET and UOP Replace electrolytes as needed  GASTROINTESTINAL A:   GERD. P:   Protonix for stress ulcer prophylaxis Continue TFs  HEMATOLOGIC / ONCOLOGIC A:   Anemia of critical illness and chronic disease. Stage IIB non-small cell lung cancer - s/p XRT (followed by Dr. Julien Nordmann). P:  SCD's / Heparin F/u CBC  INFECTIOUS A:   AECOPD. P:   levaquin x7ds  ENDOCRINE A:   Steroid induced hyperglycemia. P:   SSI  NEUROLOGIC A:   Acute metabolic encephalopathy. Hx anxiety P:   RASS goal: 0 to -1 Ct precedex + fent + prn versed Continue out-pt buspirone, diazepam  CC time 32 minutes.  Kara Mead MD. Shade Flood. [facility] Pager [phone] If no response call [phone]   08/03/2015   08/03/2015, 10:17 AM

## 2015-08-04 ENCOUNTER — Inpatient Hospital Stay (HOSPITAL_COMMUNITY): Payer: Medicare Other

## 2015-08-04 LAB — BASIC METABOLIC PANEL
Anion gap: 4 — ABNORMAL LOW (ref 5–15)
BUN: 34 mg/dL — AB (ref 6–20)
CHLORIDE: 102 mmol/L (ref 101–111)
CO2: 31 mmol/L (ref 22–32)
CREATININE: 0.6 mg/dL (ref 0.44–1.00)
Calcium: 8.4 mg/dL — ABNORMAL LOW (ref 8.9–10.3)
GFR calc Af Amer: >60 mL/min (ref >60)
GFR calc non Af Amer: >60 mL/min (ref >60)
Glucose, Bld: 213 mg/dL — ABNORMAL HIGH (ref 65–99)
Potassium: 4.6 mmol/L (ref 3.5–5.1)
Sodium: 137 mmol/L (ref 135–145)

## 2015-08-04 LAB — CBC
HEMATOCRIT: 32.8 % — AB (ref 36.0–46.0)
Hemoglobin: 9.7 g/dL — ABNORMAL LOW (ref 12.0–15.0)
MCH: 25.8 pg — AB (ref 26.0–34.0)
MCHC: 29.6 g/dL — ABNORMAL LOW (ref 30.0–36.0)
MCV: 87.2 fL (ref 78.0–100.0)
PLATELETS: 128 10*3/uL — AB (ref 150–400)
RBC: 3.76 MIL/uL — AB (ref 3.87–5.11)
RDW: 20 % — ABNORMAL HIGH (ref 11.5–15.5)
WBC: 7.3 10*3/uL (ref 4.0–10.5)

## 2015-08-04 LAB — GLUCOSE, CAPILLARY
GLUCOSE-CAPILLARY: 221 mg/dL — AB (ref 65–99)
GLUCOSE-CAPILLARY: 199 mg/dL — AB (ref 65–99)
Glucose-Capillary: 186 mg/dL — ABNORMAL HIGH (ref 65–99)
Glucose-Capillary: 207 mg/dL — ABNORMAL HIGH (ref 65–99)
Glucose-Capillary: 178 mg/dL — ABNORMAL HIGH (ref 65–99)

## 2015-08-04 LAB — CULTURE, BLOOD (ROUTINE X 2)
CULTURE: NO GROWTH
Culture: NO GROWTH

## 2015-08-04 MED ORDER — FREE WATER
200.0000 mL | Freq: Four times a day (QID) | Status: DC
Start: 2015-08-04 — End: 2015-08-05
  Administered 2015-08-04 – 2015-08-05 (×4): 200 mL

## 2015-08-04 NOTE — Progress Notes (Signed)
PULMONARY / CRITICAL CARE MEDICINE   Name: Kelsey Garcia MRN: [id number] DOB: Jul 08, 1945    ADMISSION DATE:  07/30/2015  CHIEF COMPLAINT:  AMS  BRIEF:   Kelsey Garcia is a 70 y.o. female with PMH as outlined below including COPD on chronic 2 - 3 L home O2 and '10mg'$  prednisone (followed by [name]) and stage II B NSCLC s/p XRT (followed by Dr. Earlie Server).  She had recent admission 07/15/15 through 07/18/15 for CAP and AECOPD.  Re-admitted w/ working dx on 6/20 w/ Acute on Chronic resp failure in setting of AECOPD vs early HCAP  SUBJECTIVE:  afebrile Agitated when drips lowered -  got versed this am & unresponsive   VITAL SIGNS: BP 98/60 mmHg  Pulse 97  Temp(Src) 98.6 F (37 C) (Oral)  Resp 24  Ht '5\' 5"'$  (1.651 m)  Wt 188 lb 4.4 oz (85.4 kg)  BMI 31.33 kg/m2  SpO2 97%  HEMODYNAMICS:    VENTILATOR SETTINGS: Vent Mode:  [-] PRVC FiO2 (%):  [30 %] 30 % Set Rate:  [24 bmp] 24 bmp Vt Set:  [440 mL-450 mL] 440 mL PEEP:  [5 cmH20] 5 cmH20 Plateau Pressure:  [16 cmH20-19 cmH20] 18 cmH20  INTAKE / OUTPUT: I/O last 3 completed shifts: In: 4538.2 [I.V.:2258.2; NG/GT:2130; IV Piggyback:150] Out: 1625 [Urine:1625]  PHYSICAL EXAMINATION: General: chr ill, intubated, sedated Neuro: RASS -2 , non focal HEENT: ETT in place Cardiac: regular, no murmur Chest: no wheeze Abd: soft, non tender Ext: no edema Skin: no rashes  LABS:  BMET  Recent Labs Lab 08/02/15 0311 08/03/15 0308 08/04/15 0334  NA 138 137 137  K 4.1 4.1 4.6  CL 102 101 102  CO2 '29 30 31  '$ BUN 30* 30* 34*  CREATININE 0.55 0.48 0.60  GLUCOSE 168* 209* 213*    Electrolytes  Recent Labs Lab 07/31/15 0318 07/31/15 1059 08/01/15 0307 08/02/15 0311 08/03/15 0308 08/04/15 0334  CALCIUM 7.7*  --  8.8* 8.7* 8.5* 8.4*  MG 1.7 1.8 2.1  --   --   --   PHOS 3.2 3.0 3.0  --   --   --     CBC  Recent Labs Lab 08/02/15 0311 08/03/15 0308 08/04/15 0334  WBC 12.1* 8.5 7.3  HGB 9.1* 9.3* 9.7*  HCT 30.5*  30.9* 32.8*  PLT 174 133* 128*    Coag's No results for input(s): APTT, INR in the last 168 hours.  Sepsis Markers  Recent Labs Lab 07/30/15 1530  LATICACIDVEN 0.62    ABG  Recent Labs Lab 07/31/15 0435 08/01/15 0348 08/03/15 1352  PHART 7.237* 7.371 7.357  PCO2ART 68.5* 49.5* 56.3*  PO2ART 80.2 58.1* 64.6*    Liver Enzymes  Recent Labs Lab 07/30/15 1442  AST 11*  ALT 11*  ALKPHOS 75  BILITOT 0.5  ALBUMIN 3.5    Cardiac Enzymes No results for input(s): TROPONINI, PROBNP in the last 168 hours.  Glucose  Recent Labs Lab 08/03/15 1154 08/03/15 1604 08/03/15 2009 08/03/15 2359 08/04/15 0456 08/04/15 0834  GLUCAP 174* 132* 134* 186* 221* 199*    Imaging Dg Chest Port 1 View  08/04/2015  CLINICAL DATA:  Acute hypoxemic respiratory failure, atrial fibrillation, hypertension, CHF, COPD, former smoker, lung cancer EXAM: PORTABLE CHEST 1 VIEW COMPARISON:  Portable exam 044 at what compared to 08/03/2015 FINDINGS: Tip of endotracheal tube projects 1.3 cm above carina. Nasogastric tube extends into stomach. Enlargement of cardiac silhouette with pulmonary vascular congestion. Atherosclerotic calcification aorta. Atelectasis versus consolidation  LEFT lower lobe. Mild RIGHT basilar atelectasis. Remaining lungs clear. No pleural effusion or pneumothorax. IMPRESSION: Enlargement of cardiac silhouette with pulmonary vascular congestion. Aortic atherosclerosis. RIGHT basilar atelectasis with increased atelectasis versus consolidation in LEFT lower lobe. Electronically Signed   By: Lavonia Dana M.D.   On: 08/04/2015 07:28     STUDIES:  03/05/15 PFT >> FEV1 0.74 (31%), FEV1% 40, TLC 3.68 (71%), DLCO 19%  CULTURES: Blood 06/20 >ng Urine 6/20 > multiple species  ANTIBIOTICS: Vanc 06/20 > 6/22 Cefepime 06/20 > 6/22 Levaquin 6/22 >   SIGNIFICANT EVENTS: 06/20 > admitted with AECOPD   LINES/TUBES: ETT 06/20 >  DISCUSSION:  NSCLC -presumed cured by SBRT. Hopeful  for succesful extubation here  ASSESSMENT / PLAN:  PULMONARY A: Acute on chronic hypoxic/hypercapnic respiratory failure 2nd to AECOPD. P:   Pressure support wean - tolerated 8/5, but too sedated to extubate today Scheduled BDs Continue solumedrol  CARDIOVASCULAR A:  Hx CAD, chronic diastolic CHF, HTN. P:  Continue ASA, cardizem, lopressor  RENAL A:   No acute issues. P:   KVO fluids Monitor BMET and UOP Replace electrolytes as needed  GASTROINTESTINAL A:   GERD. P:   Protonix for stress ulcer prophylaxis Continue TFs  HEMATOLOGIC / ONCOLOGIC A:   Anemia of critical illness and chronic disease. Stage IIB non-small cell lung cancer - s/p XRT (followed by Dr. Julien Nordmann). thrombocytopenia P:  SCD's / Heparin F/u CBC  INFECTIOUS A:   AECOPD. P:   levaquin x7ds  ENDOCRINE A:   Steroid induced hyperglycemia. P:   SSI  NEUROLOGIC A:   Acute metabolic encephalopathy. Hx anxiety P:   RASS goal: 0 to -1 Ct precedex + fent + prn versed Continue out-pt buspirone, diazepam  CC time 32 minutes.  Kara Mead MD. Shade Flood. [facility] Pager [phone] If no response call [phone]    08/04/2015, 10:39 AM

## 2015-08-05 ENCOUNTER — Ambulatory Visit: Payer: Medicare Other | Admitting: Acute Care

## 2015-08-05 ENCOUNTER — Inpatient Hospital Stay (HOSPITAL_COMMUNITY): Payer: Medicare Other

## 2015-08-05 DIAGNOSIS — R410 Disorientation, unspecified: Secondary | ICD-10-CM | POA: Insufficient documentation

## 2015-08-05 DIAGNOSIS — F411 Generalized anxiety disorder: Secondary | ICD-10-CM

## 2015-08-05 DIAGNOSIS — Z7189 Other specified counseling: Secondary | ICD-10-CM

## 2015-08-05 DIAGNOSIS — Z515 Encounter for palliative care: Secondary | ICD-10-CM

## 2015-08-05 LAB — BASIC METABOLIC PANEL
ANION GAP: 5 (ref 5–15)
BUN: 39 mg/dL — ABNORMAL HIGH (ref 6–20)
CHLORIDE: 101 mmol/L (ref 101–111)
CO2: 30 mmol/L (ref 22–32)
CREATININE: 0.51 mg/dL (ref 0.44–1.00)
Calcium: 8.3 mg/dL — ABNORMAL LOW (ref 8.9–10.3)
GFR calc non Af Amer: >60 mL/min (ref >60)
Glucose, Bld: 191 mg/dL — ABNORMAL HIGH (ref 65–99)
POTASSIUM: 4.8 mmol/L (ref 3.5–5.1)
SODIUM: 136 mmol/L (ref 135–145)

## 2015-08-05 LAB — CBC
HEMATOCRIT: 33.4 % — AB (ref 36.0–46.0)
HEMOGLOBIN: 10 g/dL — AB (ref 12.0–15.0)
MCH: 26 pg (ref 26.0–34.0)
MCHC: 29.9 g/dL — ABNORMAL LOW (ref 30.0–36.0)
MCV: 86.8 fL (ref 78.0–100.0)
PLATELETS: 124 10*3/uL — AB (ref 150–400)
RBC: 3.85 MIL/uL — AB (ref 3.87–5.11)
RDW: 20 % — ABNORMAL HIGH (ref 11.5–15.5)
WBC: 9.4 10*3/uL (ref 4.0–10.5)

## 2015-08-05 LAB — GLUCOSE, CAPILLARY
GLUCOSE-CAPILLARY: 202 mg/dL — AB (ref 65–99)
GLUCOSE-CAPILLARY: 234 mg/dL — AB (ref 65–99)
Glucose-Capillary: 108 mg/dL — ABNORMAL HIGH (ref 65–99)
Glucose-Capillary: 150 mg/dL — ABNORMAL HIGH (ref 65–99)
Glucose-Capillary: 151 mg/dL — ABNORMAL HIGH (ref 65–99)
Glucose-Capillary: 211 mg/dL — ABNORMAL HIGH (ref 65–99)

## 2015-08-05 MED ORDER — DILTIAZEM HCL 60 MG PO TABS
120.0000 mg | ORAL_TABLET | Freq: Three times a day (TID) | ORAL | Status: DC
Start: 2015-08-05 — End: 2015-08-09
  Administered 2015-08-05 – 2015-08-09 (×12): 120 mg via ORAL
  Filled 2015-08-05 (×3): qty 2
  Filled 2015-08-05 (×3): qty 4
  Filled 2015-08-05 (×7): qty 2

## 2015-08-05 MED ORDER — PANTOPRAZOLE SODIUM 40 MG PO TBEC
40.0000 mg | DELAYED_RELEASE_TABLET | ORAL | Status: DC
Start: 2015-08-06 — End: 2015-08-09
  Administered 2015-08-06 – 2015-08-09 (×4): 40 mg via ORAL
  Filled 2015-08-05 (×4): qty 1

## 2015-08-05 MED ORDER — METOPROLOL TARTRATE 12.5 MG HALF TABLET
12.5000 mg | ORAL_TABLET | Freq: Two times a day (BID) | ORAL | Status: DC
Start: 2015-08-05 — End: 2015-08-09
  Administered 2015-08-05 – 2015-08-09 (×8): 12.5 mg via ORAL
  Filled 2015-08-05 (×8): qty 1

## 2015-08-05 MED ORDER — DIAZEPAM 5 MG/ML IJ SOLN
2.0000 mg | Freq: Three times a day (TID) | INTRAMUSCULAR | Status: DC
  Administered 2015-08-05 (×2): 2 mg via INTRAVENOUS
  Filled 2015-08-05 (×2): qty 2

## 2015-08-05 MED ORDER — FLUOXETINE HCL 20 MG PO CAPS
20.0000 mg | ORAL_CAPSULE | Freq: Every day | ORAL | Status: DC
  Administered 2015-08-06 – 2015-08-09 (×4): 20 mg via ORAL
  Filled 2015-08-05 (×4): qty 1

## 2015-08-05 NOTE — Progress Notes (Signed)
Nutrition Follow-up  DOCUMENTATION CODES:   Not applicable  INTERVENTION:  - Will d/c TF and free water flush order given extubation, removal of OGT.  - Diet advancement as medically feasible. - RD will monitor for needs with diet advancement.  NUTRITION DIAGNOSIS:   Inadequate oral intake related to inability to eat as evidenced by NPO status. -ongoing  GOAL:   Patient will meet greater than or equal to 90% of their needs -previously met with TF.  MONITOR:   Diet advancement, Weight trends, Labs, I & O's  ASSESSMENT:   70 y.o. female with PMH as outlined below including COPD on chronic 2 - 3 L home O2 and 47m prednisone (followed by Dr. AElsworth Soho and stage II B NSCLC s/p XRT (followed by Dr. MEarlie Server. She had recent admission 07/15/15 through 07/18/15 for CAP and AECOPD.   6/26 Pt extubated shortly before RD visit this AM with OGT now out; TF d/c'ed and diet not yet advanced. Nutrition needs updated based on this event and admission weight of 77.11 kg used to estimate needs given weight continuing to trend up.  Pt not meeting needs. Will monitor for needs with diet advancement. Medications reviewed; sliding scale Novolog, 40 mg Solu-medrol every 12 hours, 40 mg Protonix every 24 hours. Labs reviewed; CBGs: 202 and 211 mg/dL this AM, BUN: 39 mg/dL, Ca: 8.3 mg/dL.    6/23 - Pt continues with OGT in place.  - No family or visitors present again this AM, but sitter at bedside. - SKennon Holterreports that pt continues to have some agitation when touched; bilateral mitten restraints remain in place.  - Continue to be unable to perform physical assessment but no muscle or fat wasting visible to upper body.  - Nutrition needs re-estimated based on current Tmax and Ve and admission weight (77.11 kg) used in calculation as weight has been trending up.  - Patient is currently intubated on ventilator support; MV: 7.1 L/min; Temp (24hrs),  Max:100.6 F (38.1 C); Propofol: none - Pt currently  receiving Vital 1.2 @ 60 mL/hr with 50 mL free water every 6 hours. This regimen is providing 1728 kcal, 108 grams of protein, and 1368 mL free water.  Drips: Fentanyl @ 200 mcg/hr, Precedex @ 1 mcg/kg/hr.     Diet Order:  Diet NPO time specified  Skin:  Reviewed, no issues  Last BM:  PTA  Height:   Ht Readings from Last 1 Encounters:  07/31/15 5' 5" (1.651 m)    Weight:   Wt Readings from Last 1 Encounters:  08/05/15 192 lb 10.9 oz (87.4 kg)    Ideal Body Weight:  56.82 kg (kg)  BMI:  Body mass index is 32.06 kg/(m^2).  Estimated Nutritional Needs:   Kcal:  1900-2100  Protein:  90-100 grams  Fluid:  2 L/day  EDUCATION NEEDS:   No education needs identified at this time     JJarome Matin MS, RD, LDN Inpatient Clinical Dietitian Pager # [phone] hours/weekend pager # [phone]

## 2015-08-05 NOTE — Progress Notes (Signed)
eLink Physician-Brief Progress Note Patient Name: Kelsey Garcia DOB: 1945/12/10 MRN: [id number]   Date of Service  08/05/2015  HPI/Events of Note  Notified that pt's UOP 30cc/h, CVP 10-12.  No new orders at this time  eICU Interventions       Intervention Category Minor Interventions: Other:  Ashkan Chamberland S. 08/05/2015, 6:34 PM

## 2015-08-05 NOTE — Progress Notes (Signed)
Fentanyl drip dc'd 120 cc wasted (10 mcg/ml) . Witnessed by Antony Blackbird RN
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
## 2015-08-05 NOTE — Progress Notes (Signed)
PULMONARY / CRITICAL CARE MEDICINE   Name: Kelsey Garcia MRN: [id number] DOB: 11-Sep-1945    ADMISSION DATE:  07/30/2015  CHIEF COMPLAINT:  AMS  BRIEF:   Kelsey Garcia is a 70 y.o. female with PMH as outlined below including COPD on chronic 2 - 3 L home O2 and '10mg'$  prednisone (followed by [name]) and stage II B NSCLC s/p XRT (followed by Dr. Earlie Server).  She had recent admission 07/15/15 through 07/18/15 for CAP and AECOPD.  Re-admitted w/ working dx on 6/20 w/ Acute on Chronic resp failure in setting of AECOPD vs early HCAP  SUBJECTIVE:  Still with anxiety when sedation is weaned off.   VITAL SIGNS: BP 95/43 mmHg  Pulse 109  Temp(Src) 97.7 F (36.5 C) (Axillary)  Resp 16  Ht '5\' 5"'$  (1.651 m)  Wt 192 lb 10.9 oz (87.4 kg)  BMI 32.06 kg/m2  SpO2 94%  HEMODYNAMICS:    VENTILATOR SETTINGS: Vent Mode:  [-] PRVC FiO2 (%):  [30 %] 30 % Set Rate:  [24 bmp] 24 bmp Vt Set:  [440 mL] 440 mL PEEP:  [5 cmH20] 5 cmH20 Plateau Pressure:  [10 cmH20-20 cmH20] 10 cmH20  INTAKE / OUTPUT: I/O last 3 completed shifts: In: 4831.4 [I.V.:2056.4; NG/GT:2675; IV Piggyback:100] Out: 1375 [Urine:1375]  PHYSICAL EXAMINATION: General: Intubated, sedated Neuro: No focal deficits HEENT: ETT in place Cardiac: RRR, No MRG Chest: No wheeze or crackles Abd: soft, non tender, + BS Ext: No edema Skin: No rashes  LABS:  BMET  Recent Labs Lab 08/03/15 0308 08/04/15 0334 08/05/15 0327  NA 137 137 136  K 4.1 4.6 4.8  CL 101 102 101  CO2 '30 31 30  '$ BUN 30* 34* 39*  CREATININE 0.48 0.60 0.51  GLUCOSE 209* 213* 191*    Electrolytes  Recent Labs Lab 07/31/15 0318 07/31/15 1059 08/01/15 0307  08/03/15 0308 08/04/15 0334 08/05/15 0327  CALCIUM 7.7*  --  8.8*  < > 8.5* 8.4* 8.3*  MG 1.7 1.8 2.1  --   --   --   --   PHOS 3.2 3.0 3.0  --   --   --   --   < > = values in this interval not displayed.  CBC  Recent Labs Lab 08/03/15 0308 08/04/15 0334 08/05/15 0327  WBC 8.5 7.3 9.4   HGB 9.3* 9.7* 10.0*  HCT 30.9* 32.8* 33.4*  PLT 133* 128* 124*    Coag's No results for input(s): APTT, INR in the last 168 hours.  Sepsis Markers  Recent Labs Lab 07/30/15 1530  LATICACIDVEN 0.62    ABG  Recent Labs Lab 07/31/15 0435 08/01/15 0348 08/03/15 1352  PHART 7.237* 7.371 7.357  PCO2ART 68.5* 49.5* 56.3*  PO2ART 80.2 58.1* 64.6*    Liver Enzymes  Recent Labs Lab 07/30/15 1442  AST 11*  ALT 11*  ALKPHOS 75  BILITOT 0.5  ALBUMIN 3.5    Cardiac Enzymes No results for input(s): TROPONINI, PROBNP in the last 168 hours.  Glucose  Recent Labs Lab 08/04/15 0834 08/04/15 1148 08/04/15 1638 08/04/15 2005 08/05/15 0054 08/05/15 0502  GLUCAP 199* 207* 178* 151* 211* 202*    Imaging Dg Chest Port 1 View  08/05/2015  CLINICAL DATA:  Respiratory failure. EXAM: PORTABLE CHEST 1 VIEW COMPARISON:  08/04/2015. FINDINGS: Endotracheal tube 2 cm above the carina. NG tube in stable position. Stable cardiomegaly. Interim improvement of pulmonary venous congestion. Bibasilar atelectasis and/or infiltrates. Small left pleural effusion. No pneumothorax . IMPRESSION: 1. Endotracheal  tube 2 cm above the carina. NG tube in stable position. 2. Persistent bibasilar atelectasis and infiltrate left lower lobe. Small left pleural effusion. No interim change. 3. Cardiomegaly. Interim improvement of pulmonary venous congestion. Electronically Signed   By: Marcello Moores  Register   On: 08/05/2015 06:55     STUDIES:  03/05/15 PFT >> FEV1 0.74 (31%), FEV1% 40, TLC 3.68 (71%), DLCO 19%  CULTURES: Blood 06/20 >ng Urine 6/20 > multiple species  ANTIBIOTICS: Vanc 06/20 > 6/22 Cefepime 06/20 > 6/22 Levaquin 6/22 >   SIGNIFICANT EVENTS: 06/20 > admitted with AECOPD   LINES/TUBES: ETT 06/20 >  DISCUSSION:  NSCLC -presumed cured by SBRT.  Admitted with AECOPD. She is getting close to extubation but still with significant anxiety issues.    ASSESSMENT /  PLAN:  PULMONARY A: Acute on chronic hypoxic/hypercapnic respiratory failure 2nd to AECOPD. P:   Wake up and breath assessments. Scheduled BDs Continue solumedrol at current dose.   CARDIOVASCULAR A:  Hx CAD, chronic diastolic CHF, HTN. P:  Continue ASA, cardizem, lopressor  RENAL A:   No acute issues. P:   KVO fluids Monitor BMET and UOP Replace electrolytes as needed  GASTROINTESTINAL A:   GERD. P:   Protonix for stress ulcer prophylaxis Continue TFs  HEMATOLOGIC / ONCOLOGIC A:   Anemia of critical illness and chronic disease. Stage IIB non-small cell lung cancer - s/p XRT (followed by Dr. Julien Nordmann). thrombocytopenia P:  SCD's / Heparin F/u CBC  INFECTIOUS A:   AECOPD. P:   Levaquin x7ds  ENDOCRINE A:   Steroid induced hyperglycemia. P:   SSI  NEUROLOGIC A:   Acute metabolic encephalopathy. Hx anxiety P:   RASS goal: 0 to -1 Wean off fentanyl. Continue precedex and PRN versed. Continue out-pt buspirone, diazepam  Critical care time- 35 mins.  Marshell Garfinkel MD [facility] Pager [phone] If no answer or after 3pm call: [phone] 08/05/2015, 8:09 AM

## 2015-08-05 NOTE — Procedures (Signed)
Extubation Procedure Note  Patient Details:   Name: Kelsey Garcia DOB: Jun 21, 1945 MRN: [id number]   Airway Documentation:     Evaluation  O2 sats: stable throughout Complications: No apparent complications Patient did tolerate procedure well. Bilateral Breath Sounds: Clear, Diminished  Pt placed on 6 lpm [place], Sat 92% Yes, pt able to speak  Tamala Julian 08/05/2015, 10:08 AM

## 2015-08-06 ENCOUNTER — Inpatient Hospital Stay (HOSPITAL_COMMUNITY): Payer: Medicare Other

## 2015-08-06 DIAGNOSIS — J9601 Acute respiratory failure with hypoxia: Secondary | ICD-10-CM | POA: Insufficient documentation

## 2015-08-06 LAB — BASIC METABOLIC PANEL
ANION GAP: 4 — AB (ref 5–15)
BUN: 41 mg/dL — ABNORMAL HIGH (ref 6–20)
CHLORIDE: 101 mmol/L (ref 101–111)
CO2: 31 mmol/L (ref 22–32)
CREATININE: 0.55 mg/dL (ref 0.44–1.00)
Calcium: 8.5 mg/dL — ABNORMAL LOW (ref 8.9–10.3)
GFR calc non Af Amer: >60 mL/min (ref >60)
Glucose, Bld: 147 mg/dL — ABNORMAL HIGH (ref 65–99)
POTASSIUM: 5.4 mmol/L — AB (ref 3.5–5.1)
SODIUM: 136 mmol/L (ref 135–145)

## 2015-08-06 LAB — GLUCOSE, CAPILLARY
GLUCOSE-CAPILLARY: 108 mg/dL — AB (ref 65–99)
GLUCOSE-CAPILLARY: 111 mg/dL — AB (ref 65–99)
GLUCOSE-CAPILLARY: 126 mg/dL — AB (ref 65–99)
GLUCOSE-CAPILLARY: 142 mg/dL — AB (ref 65–99)
GLUCOSE-CAPILLARY: 145 mg/dL — AB (ref 65–99)
GLUCOSE-CAPILLARY: 155 mg/dL — AB (ref 65–99)
Glucose-Capillary: 113 mg/dL — ABNORMAL HIGH (ref 65–99)
Glucose-Capillary: 110 mg/dL — ABNORMAL HIGH (ref 65–99)

## 2015-08-06 LAB — CBC
HEMATOCRIT: 34.3 % — AB (ref 36.0–46.0)
HEMOGLOBIN: 10.3 g/dL — AB (ref 12.0–15.0)
MCH: 25.6 pg — ABNORMAL LOW (ref 26.0–34.0)
MCHC: 30 g/dL (ref 30.0–36.0)
MCV: 85.1 fL (ref 78.0–100.0)
Platelets: 129 10*3/uL — ABNORMAL LOW (ref 150–400)
RBC: 4.03 MIL/uL (ref 3.87–5.11)
RDW: 19.7 % — ABNORMAL HIGH (ref 11.5–15.5)
WBC: 11.3 10*3/uL — AB (ref 4.0–10.5)

## 2015-08-06 MED ORDER — FUROSEMIDE 10 MG/ML IJ SOLN
20.0000 mg | Freq: Once | INTRAMUSCULAR | Status: AC
  Administered 2015-08-06: 20 mg via INTRAVENOUS
  Filled 2015-08-06: qty 2

## 2015-08-06 MED ORDER — CETYLPYRIDINIUM CHLORIDE 0.05 % MT LIQD
7.0000 mL | Freq: Two times a day (BID) | OROMUCOSAL | Status: DC
  Administered 2015-08-06 – 2015-08-09 (×6): 7 mL via OROMUCOSAL

## 2015-08-06 MED ORDER — DIAZEPAM 2 MG PO TABS
2.0000 mg | ORAL_TABLET | Freq: Three times a day (TID) | ORAL | Status: DC
  Administered 2015-08-06 – 2015-08-09 (×9): 2 mg via ORAL
  Filled 2015-08-06 (×9): qty 1

## 2015-08-06 NOTE — Progress Notes (Signed)
Daily Progress Note   Patient Name: Kelsey Garcia       Date: 08/06/2015 DOB: 1945-06-20  Age: 70 y.o. MRN#: [id number] Attending Physician: Rigoberto Noel, MD Primary Care Physician: Darden Amber, [place] Admit Date: 07/30/2015  Reason for Consultation/Follow-up: Establishing goals of care  Subjective: "Can I go home? I'll be good."  Patient confused but denies pain.  I spoke with Gerald Stabs (Daughter) on the phone.  She agreed to a family meeting 6/28 to complete advanced directives.  Length of Stay: 7  Current Medications: Scheduled Meds:  . antiseptic oral rinse  7 mL Mouth Rinse QID  . aspirin  325 mg Per Tube Daily  . busPIRone  10 mg Per Tube TID  . chlorhexidine gluconate (SAGE KIT)  15 mL Mouth Rinse BID  . diazepam  2 mg Intravenous TID  . diltiazem  120 mg Oral Q8H  . FLUoxetine  20 mg Oral Daily  . heparin  5,000 Units Subcutaneous Q8H  . insulin aspart  0-15 Units Subcutaneous Q4H  . ipratropium-albuterol  3 mL Nebulization Q6H  . levofloxacin (LEVAQUIN) IV  750 mg Intravenous Q24H  . methylPREDNISolone (SOLU-MEDROL) injection  40 mg Intravenous Q12H  . metoprolol tartrate  12.5 mg Oral BID  . pantoprazole  40 mg Oral Q24H    Continuous Infusions: . sodium chloride 10 mL/hr at 08/04/15 2100  . dexmedetomidine 1 mcg/kg/hr (08/06/15 0610)  . fentaNYL infusion INTRAVENOUS Stopped (08/05/15 0743)    PRN Meds: albuterol, fentaNYL, midazolam  Physical Exam: Awake but slightly sedate.  Confused.  Wd, chronically ill appearing female.   CV rrr Resp NAD, no increased work of breathing, congested cough Abd Soft, nt, nd, +bs Extremities:  1+ edema in LE bilaterally Skin:  Multiple bruises on hands and feet, legs.    Vital Signs: BP 128/89 mmHg  Pulse 111  Temp(Src) 97.6 F  (36.4 C) (Oral)  Resp 13  Ht 5' 5"  (1.651 m)  Wt 88.8 kg (195 lb 12.3 oz)  BMI 32.58 kg/m2  SpO2 100% SpO2: SpO2: 100 % O2 Device: O2 Device: Nasal Cannula O2 Flow Rate: O2 Flow Rate (L/min): 4 L/min  Intake/output summary:  Intake/Output Summary (Last 24 hours) at 08/06/15 0735 Last data filed at 08/06/15 0600  Gross per 24 hour  Intake 556.78 ml  Output  1125 ml  Net -568.22 ml   LBM: Last BM Date:  (PTA) Baseline Weight: Weight: 77.111 kg (170 lb) Most recent weight: Weight: 88.8 kg (195 lb 12.3 oz)       Palliative Assessment/Data:    Flowsheet Rows        Most Recent Value   Intake Tab    Referral Department  Critical care   Unit at Time of Referral  ICU   Palliative Care Primary Diagnosis  Pulmonary   Date Notified  08/04/15   Palliative Care Type  New Palliative care   Reason for referral  Non-pain Symptom, Clarify Goals of Care   Date of Admission  07/30/15   Date first seen by Palliative Care  08/05/15   # of days Palliative referral response time  1 Day(s)   # of days IP prior to Palliative referral  5   Clinical Assessment    Palliative Performance Scale Score  30%   Anxiety Max Last 24 Hours  6   Anxiety Min Last 24 Hours  2   Psychosocial & Spiritual Assessment    Palliative Care Outcomes    Patient/Family meeting held?  Yes   Who was at the meeting?  Met with sister Dot, Daughter Gerald Stabs at patient's bedside.      Patient Active Problem List   Diagnosis Date Noted  . Goals of care, counseling/discussion   . Acute delirium   . Acute respiratory failure with hypercapnia ([place])   . Hypocalcemia   . Acute encephalopathy   . AKI (acute kidney injury) ([place])   . Chronic respiratory failure ([place]) 05/22/2015  . Acute exacerbation of chronic obstructive pulmonary disease (COPD) ([place]) 04/04/2015  . Chronic fatigue   . Cancer of lingula of lung ([place]) 03/04/2015  . Palliative care encounter   . Pain in joint, pelvic region and thigh   . Anxiety state     . S/P bronchoscopy with biopsy   . Chronic obstructive pulmonary disease with acute exacerbation ([place])   . Chronic diastolic (congestive) heart failure ([place]) 02/23/2015  . Atrial fibrillation with RVR ([place]) 01/30/2015  . Thoracic vertebral fracture ([place]) 11/03/2014  . Acute on chronic diastolic heart failure ([place])   . Major depressive disorder, recurrent, in full remission with anxious distress ([place]) 07/23/2014  . Narcotic addiction ([place]) 07/12/2014  . Narcotic dependence ([place]) 07/12/2014  . Generalized abdominal pain 07/05/2014  . Chronic pain syndrome 07/02/2014  . COPD exacerbation ([place]) 06/19/2014  . Atrial fibrillation ([place]) 06/19/2014  . Pancytopenia ([place]) 05/15/2014  . Fracture of rib of left side with delayed healing 05/15/2014  . Respiratory failure, chronic ([place]) 12/20/2013  . Neck pain on right side 12/18/2013  . Chronic diastolic CHF (congestive heart failure) ([place]) 10/12/2013  . Osteoporosis 11/29/2012  . Easy bruising 10/01/2012  . Oral thrush 09/24/2012  . Other screening mammogram 12/25/2011  . Screening for colon cancer 12/25/2011  . Impaired mobility 11/26/2011  . 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 Long term current use of opiate analgesic 10/30/2011  . GERD (gastroesophageal reflux disease) 10/30/2011  . Chronic cough 08/28/2011  . Elevated MCV 08/28/2011  . Financial difficulties 04/22/2011  . Preventative health care 10/30/2010  . Anxiety 10/30/2010  . HLD (hyperlipidemia) 05/24/2009  . NICOTINE ADDICTION 01/07/2009  . Osteoarthritis 11/28/2008  . COPD (chronic obstructive pulmonary disease) ([place]) 03/28/2008  . OBSESSIVE-COMPULSIVE DISORDER 12/29/2005  . Depression with anxiety 12/29/2005  . Essential hypertension 12/29/2005  . CARDIOMYOPATHY 12/29/2005  . Atrial fibrillation with rapid ventricular  response ([place]) 12/29/2005  . CHOLELITHIASIS 12/29/2005  . INSOMNIA 12/29/2005    Palliative Care Assessment & Plan   Patient Profile: 70 y.o. female with  past medical history of COPD on 2-3L of oxygen and prednisone daily at home, diastolic CHF, NSCLCA status post XRT, CAD, paroxysmal atrial fibrillation, OCD, anxiety, opioid dependence, who was admitted on 07/30/2015 with an acute on chronic COPD exacerbation. She had a recent admission June 5 through June 8 for COPD and CAP. She was intubated 6/20 through 6/25, and extubated 6/26 AM. The patient has had multiple trips to the ER and multiple admissions in the past 6 months. PMT was consulted for goals of care.   Assessment: Patient less combative, a little more lucid but still confused today.  Still on precedex drip.  Still with involuntary mvmts Resp status appears much more stable.  This is helpful as it allows for more time for a family discussion.  Patient will likely have speech and PT evaluations today.  This information will be helpful as well.  Recommendations/Plan: Speech and Physical Therapy evaluations today.  These will help with GOC. Family meeting 6/28 to complete advanced directives and hopefully change code status to DNR. Recommend continuing scheduled valium today while precedex drip is being discontinued.  Will change to PO.  Goals of Care and Additional Recommendations:  Limitations on Scope of Treatment: Full scope treatment.  Code Status: Partial     Code Status Orders        Start     Ordered   07/30/15 2205  Limited resuscitation (code)   Continuous    Question Answer Comment  In the event of cardiac or respiratory ARREST: Initiate Code Blue, Call Rapid Response No   In the event of cardiac or respiratory ARREST: Perform CPR No   In the event of cardiac or respiratory ARREST: Perform Intubation/Mechanical Ventilation Yes   In the event of cardiac or respiratory ARREST: Use NIPPV/BiPAp only if indicated Yes   In the event of cardiac or respiratory ARREST: Administer ACLS medications if indicated No   In the event of cardiac or respiratory ARREST: Perform  Defibrillation or Cardioversion if indicated No      07/30/15 2205       Prognosis:   < 6 months based on recurrent hospitalizations, severe COPD with CHF complicated by psychiatric illness.  Discharge Planning:  Home with Palliative Services and Home health.  Speech and PT evaluations pending.  Care plan was discussed with bedside RN and daughter Gerald Stabs.  Thank you for allowing the Palliative Medicine Team to assist in the care of this patient.   Time In: 7:40 Time Out: 8:00 Total Time 15 Prolonged Time Billed  no      Greater than 50%  of this time was spent counseling and coordinating care related to the above assessment and plan.  Imogene Burn, PA-C Palliative Medicine Pager: [phone]  Please contact Palliative Medicine Team phone at [phone] for questions and concerns.

## 2015-08-06 NOTE — Evaluation (Signed)
Clinical/Bedside Swallow Evaluation Patient Details  Name: Kelsey Garcia MRN: [id number] Date of Birth: 12-17-1945  Today's Date: 08/06/2015 Time: SLP Start Time (ACUTE ONLY): 1500 SLP Stop Time (ACUTE ONLY): 1526 SLP Time Calculation (min) (ACUTE ONLY): 26 min  Past Medical History:  Past Medical History  Diagnosis Date  . Atrial fibrillation ([place])   . Hypertension   . Insomnia   . Nonischemic cardiomyopathy ([place])   . Hyperlipidemia   . Anxiety   . Cholelithiasis   . Insomnia   . Mitral regurgitation     noted 2010  . H/O epistaxis   . Rhinitis, allergic   . CHF (congestive heart failure) ([place])   . Pneumonia     "several times w/exacerbations of the COPD; nothing in the last year" (07/15/2012)  . COPD (chronic obstructive pulmonary disease) ([place])     as of 7/13 on 2-3L, pfts 10/2008 with mod obstruction  . Chronic bronchitis with COPD (chronic obstructive pulmonary disease) ([place])   . Shortness of breath     "all the time right now" (07/15/2012)  . GERD (gastroesophageal reflux disease)   . [id number](478.2)     "weekly" (07/15/2012)  . Migraines     "weekly for awhile; cleared up as I got older" (07/15/2012)  . DJD (degenerative joint disease)   . Arthritis     "all over" (07/15/2012)  . OCD (obsessive compulsive disorder)   . OCD (obsessive compulsive disorder)   . Depression     h/o SI; "last time I was really serious about it was ~ 1997" (07/15/2012)  . Thoracic vertebral fracture ([place]) 11/03/2014  . Primary lung cancer [facility])    Past Surgical History:  Past Surgical History  Procedure Laterality Date  . Tubal ligation  1972  . Cardioversion  2003; 07/2003  . Video bronchoscopy Bilateral 02/27/2015    Procedure: VIDEO BRONCHOSCOPY WITH FLUORO;  Surgeon: Chesley Mires, MD;  Location: [facility] ENDOSCOPY;  Service: Cardiopulmonary;  Laterality: Bilateral;   HPI:  pt is a 70 yo female with COPD exacerbation requiring intubation from 6/20-6/26.  Pt with complex medical hx including recently  diagnosed lung cancer s/p XRT, COPD, narcotic dependence, frequent thrush, and multiple hospital admits- x5 in six months, dysphagia x 5-6 months.  CXR showed small left pleural effusion and lower lobe infiltrate.  Swallow evaluation ordered.    Assessment / Plan / Recommendation Clinical Impression  Pt with suspected chronic dysphagia with current exacerbation due to intubation/? edema and deconditioning.  She admits to dysphagia 5-6 months prior to admission = reporting sensation of food lodging in proximal esophagus requiring her to cough and re=swallow to clear.  She states liquids did not help her to clear.    Pt admits to swallowing NOW being more impaired than baseline.  Currently pt with weak nonproductive cough but voice is strong - though pt/daughter state not at baseline.  Pt does complain of sensation of pharyngeal residuals across all consistencies during today's evaluation and presents with cough with and without intake, therefore can not rule out aspiration/edema/residuals.  Advised pt to goal being to return to swallow ability prior to intubation.  Note conversation with palliative care, recommend MBS to allow pharyngeal evaluation followed by esophagram to assess for possible radiation impact on esophagus.    Currently recommend pt be npo except tsps water, ice and po medication with strict precautions.  Using teach back, educated pt and daughter to recommendations.  Will follow up for readiness for MBS next date in am.  Thanks for this consult.       Aspiration Risk  Risk for inadequate nutrition/hydration;Severe aspiration risk    Diet Recommendation NPO;Ice chips PRN after oral care;Free water protocol after oral care   Liquid Administration via: Spoon Medication Administration: Crushed with puree Postural Changes: Seated upright at 90 degrees;Remain upright for at least 30 minutes after po intake    Other  Recommendations Oral Care Recommendations: Oral care BID   Follow up  Recommendations    TBD   Frequency and Duration     TBD       Prognosis Prognosis for Safe Diet Advancement: Fair Barriers to Reach Goals: Cognitive deficits;Severity of deficits;Time post onset (medical diagnosis)      Swallow Study   General Date of Onset: 08/06/15 HPI: pt is a 70 yo female with COPD exacerbation requiring intubation from 6/20-6/26.  Pt with complex medical hx including recently diagnosed lung cancer s/p XRT, COPD, narcotic dependence, frequent thrush, and multiple hospital admits- x5 in six months, dysphagia x 5-6 months.  CXR showed small left pleural effusion and lower lobe infiltrate.  Swallow evaluation ordered.  Type of Study: Bedside Swallow Evaluation Diet Prior to this Study: NPO Temperature Spikes Noted: No Respiratory Status: Nasal cannula (on oxygen at home 2-2.5 liters) History of Recent Intubation: Yes Length of Intubations (days): 7 days Date extubated: 08/05/15 Behavior/Cognition: Alert;Cooperative;Confused;Distractible;Requires cueing;Doesn't follow directions Oral Cavity Assessment: Erythema Oral Care Completed by SLP: No Oral Cavity - Dentition: Edentulous (did not locate dentures in room but not needed at this time) Self-Feeding Abilities: Needs assist Patient Positioning: Upright in bed Baseline Vocal Quality: Hoarse;Low vocal intensity (daughter reports voice is not normal) Volitional Cough: Weak (nonproductive, pt reports productive at times) Volitional Swallow: Able to elicit    Oral/Motor/Sensory Function Overall Oral Motor/Sensory Function: Generalized oral weakness   Ice Chips Ice chips: Impaired Presentation: Spoon Oral Phase Impairments: Reduced lingual movement/coordination Pharyngeal Phase Impairments: Multiple swallows;Decreased hyoid-laryngeal movement;Cough - Delayed Other Comments: pt reports sensation of melted ice lodging in throat   Thin Liquid Thin Liquid: Impaired Presentation: Spoon Oral Phase Impairments: Reduced  lingual movement/coordination Pharyngeal  Phase Impairments: Multiple swallows;Decreased hyoid-laryngeal movement;Cough - Delayed Other Comments: pt reports sensation of lodging in throat    Nectar Thick Nectar Thick Liquid: Not tested   Honey Thick Honey Thick Liquid: Not tested   Puree Puree: Impaired Presentation: Spoon Oral Phase Impairments: Reduced lingual movement/coordination Oral Phase Functional Implications: Prolonged oral transit Pharyngeal Phase Impairments: Suspected delayed Swallow;Multiple swallows;Decreased hyoid-laryngeal movement Other Comments: pt complained of lodging in throat   Solid   GO   Solid: Not tested        Luanna Salk, [facility] SLP [phone]

## 2015-08-06 NOTE — Progress Notes (Signed)
PULMONARY / CRITICAL CARE MEDICINE   Name: Kelsey Garcia MRN: [id number] DOB: Oct 31, 1945    ADMISSION DATE:  07/30/2015  CHIEF COMPLAINT:  AMS  BRIEF:   Kelsey Garcia is a 70 y.o. female with PMH as outlined below including COPD on chronic 2 - 3 L home O2 and '10mg'$  prednisone (followed by [name]) and stage II B NSCLC s/p XRT (followed by Dr. Earlie Server).  She had recent admission 07/15/15 through 07/18/15 for CAP and AECOPD.  Re-admitted w/ working dx on 6/20 w/ Acute on Chronic resp failure in setting of AECOPD vs early HCAP  SUBJECTIVE:  Extubated yesterday. Feels better today AM with less confusion.   VITAL SIGNS: BP 128/87 mmHg  Pulse 113  Temp(Src) 97.6 F (36.4 C) (Oral)  Resp 15  Ht '5\' 5"'$  (1.651 m)  Wt 195 lb 12.3 oz (88.8 kg)  BMI 32.58 kg/m2  SpO2 91%  HEMODYNAMICS:    VENTILATOR SETTINGS: Vent Mode:  [-]  FiO2 (%):  [4 %] 4 %  INTAKE / OUTPUT: I/O last 3 completed shifts: In: 2021.6 [I.V.:1266.6; NG/GT:605; IV Piggyback:150] Out: 1600 [Urine:1600]  PHYSICAL EXAMINATION: General: Intubated, sedated Neuro: No focal deficits HEENT: ETT in place Cardiac: RRR, No MRG Chest: Scatttered wheeze or crackles Abd: soft, non tender, + BS Ext: No edema Skin: No rashes  LABS:  BMET  Recent Labs Lab 08/04/15 0334 08/05/15 0327 08/06/15 0320  NA 137 136 136  K 4.6 4.8 5.4*  CL 102 101 101  CO2 '31 30 31  '$ BUN 34* 39* 41*  CREATININE 0.60 0.51 0.55  GLUCOSE 213* 191* 147*    Electrolytes  Recent Labs Lab 07/31/15 0318 07/31/15 1059 08/01/15 0307  08/04/15 0334 08/05/15 0327 08/06/15 0320  CALCIUM 7.7*  --  8.8*  < > 8.4* 8.3* 8.5*  MG 1.7 1.8 2.1  --   --   --   --   PHOS 3.2 3.0 3.0  --   --   --   --   < > = values in this interval not displayed.  CBC  Recent Labs Lab 08/04/15 0334 08/05/15 0327 08/06/15 0320  WBC 7.3 9.4 11.3*  HGB 9.7* 10.0* 10.3*  HCT 32.8* 33.4* 34.3*  PLT 128* 124* 129*    Coag's No results for input(s): APTT,  INR in the last 168 hours.  Sepsis Markers  Recent Labs Lab 07/30/15 1530  LATICACIDVEN 0.62    ABG  Recent Labs Lab 07/31/15 0435 08/01/15 0348 08/03/15 1352  PHART 7.237* 7.371 7.357  PCO2ART 68.5* 49.5* 56.3*  PO2ART 80.2 58.1* 64.6*    Liver Enzymes  Recent Labs Lab 07/30/15 1442  AST 11*  ALT 11*  ALKPHOS 75  BILITOT 0.5  ALBUMIN 3.5    Cardiac Enzymes No results for input(s): TROPONINI, PROBNP in the last 168 hours.  Glucose  Recent Labs Lab 08/05/15 0502 08/05/15 0820 08/05/15 1143 08/05/15 1703 08/06/15 0024 08/06/15 0414  GLUCAP 202* 234* 108* 150* 155* 142*    Imaging Dg Chest Port 1 View  08/06/2015  CLINICAL DATA:  Respiratory failure. EXAM: PORTABLE CHEST 1 VIEW COMPARISON:  08/05/2015. FINDINGS: Interim extubation and removal of NG tube. Cardiomegaly with pulmonary vascular congestion and interstitial prominence and small left pleural effusion consistent congestive heart failure. Slight worsening on today's exam. No pneumothorax. IMPRESSION: 1.  Interim extubation removal of NG tube. 2. Congestive heart failure with pulmonary interstitial edema and small left pleural effusion. Slight worsening on today's exam. Electronically Signed  By: [place]   On: 08/06/2015 07:07     STUDIES:  03/05/15 PFT >> FEV1 0.74 (31%), FEV1% 40, TLC 3.68 (71%), DLCO 19%  CULTURES: Blood 06/20 >ng Urine 6/20 > multiple species  ANTIBIOTICS: Vanc 06/20 > 6/22 Cefepime 06/20 > 6/22 Levaquin 6/22 >   SIGNIFICANT EVENTS: 06/20 > admitted with AECOPD   LINES/TUBES: ETT 06/20 > 6/26  DISCUSSION:  NSCLC -presumed cured by SBRT.  Admitted with AECOPD. Extubated yesterday.  Ongoing discussion regarding goals of care.    ASSESSMENT / PLAN:  PULMONARY A: Acute on chronic hypoxic/hypercapnic respiratory failure 2nd to AECOPD. P:   Wean down Fio2 as tolerated. Scheduled BDs Continue solumedrol at current dose.   CARDIOVASCULAR A:  Hx CAD,  chronic diastolic CHF, HTN. P:  Continue ASA, cardizem, lopressor  RENAL A:   No acute issues. P:   Start lasix for diuresis Monitor BMET and UOP Replace electrolytes as needed  GASTROINTESTINAL A:   GERD. P:   Protonix for stress ulcer prophylaxis Swallow eval today  HEMATOLOGIC / ONCOLOGIC A:   Anemia of critical illness and chronic disease. Stage IIB non-small cell lung cancer - s/p XRT (followed by Dr. Julien Nordmann). thrombocytopenia P:  SCD's / Heparin F/u CBC  INFECTIOUS A:   AECOPD. P:   Levaquin x7ds. Last date 6/29  ENDOCRINE A:   Steroid induced hyperglycemia. P:   SSI  NEUROLOGIC A:   Acute metabolic encephalopathy. Hx anxiety P:   Wean off precedex. Standing valium as per palliative care Continue out-pt buspirone  Critical care time- 35 mins.  Marshell Garfinkel MD [facility] Pager [phone] If no answer or after 3pm call: [phone] 08/06/2015, 8:35 AM

## 2015-08-07 ENCOUNTER — Inpatient Hospital Stay (HOSPITAL_COMMUNITY): Payer: Medicare Other

## 2015-08-07 DIAGNOSIS — T17998A Other foreign object in respiratory tract, part unspecified causing other injury, initial encounter: Secondary | ICD-10-CM

## 2015-08-07 DIAGNOSIS — K5901 Slow transit constipation: Secondary | ICD-10-CM | POA: Insufficient documentation

## 2015-08-07 DIAGNOSIS — T17908A Unspecified foreign body in respiratory tract, part unspecified causing other injury, initial encounter: Secondary | ICD-10-CM | POA: Insufficient documentation

## 2015-08-07 LAB — BASIC METABOLIC PANEL
Anion gap: 6 (ref 5–15)
BUN: 30 mg/dL — ABNORMAL HIGH (ref 6–20)
CHLORIDE: 100 mmol/L — AB (ref 101–111)
CO2: 31 mmol/L (ref 22–32)
CREATININE: 0.44 mg/dL (ref 0.44–1.00)
Calcium: 8.4 mg/dL — ABNORMAL LOW (ref 8.9–10.3)
GFR calc non Af Amer: >60 mL/min (ref >60)
Glucose, Bld: 127 mg/dL — ABNORMAL HIGH (ref 65–99)
POTASSIUM: 4.8 mmol/L (ref 3.5–5.1)
SODIUM: 137 mmol/L (ref 135–145)

## 2015-08-07 LAB — CBC
HCT: 34.9 % — ABNORMAL LOW (ref 36.0–46.0)
HEMOGLOBIN: 10.5 g/dL — AB (ref 12.0–15.0)
MCH: 25.5 pg — ABNORMAL LOW (ref 26.0–34.0)
MCHC: 30.1 g/dL (ref 30.0–36.0)
MCV: 84.9 fL (ref 78.0–100.0)
Platelets: 125 10*3/uL — ABNORMAL LOW (ref 150–400)
RBC: 4.11 MIL/uL (ref 3.87–5.11)
RDW: 19.8 % — ABNORMAL HIGH (ref 11.5–15.5)
WBC: 9.5 10*3/uL (ref 4.0–10.5)

## 2015-08-07 LAB — PROCALCITONIN: Procalcitonin: <0.1 ng/mL

## 2015-08-07 LAB — GLUCOSE, CAPILLARY
GLUCOSE-CAPILLARY: 114 mg/dL — AB (ref 65–99)
Glucose-Capillary: 131 mg/dL — ABNORMAL HIGH (ref 65–99)
Glucose-Capillary: 146 mg/dL — ABNORMAL HIGH (ref 65–99)
Glucose-Capillary: 155 mg/dL — ABNORMAL HIGH (ref 65–99)
Glucose-Capillary: 121 mg/dL — ABNORMAL HIGH (ref 65–99)

## 2015-08-07 NOTE — Procedures (Signed)
Objective Swallowing Evaluation: Type of Study: MBS-Modified Barium Swallow Study  Patient Details  Name: Kelsey Garcia MRN: [id number] Date of Birth: 06-08-1945  Today's Date: 08/07/2015 Time: SLP Start Time (ACUTE ONLY): 0931-SLP Stop Time (ACUTE ONLY): 0959 SLP Time Calculation (min) (ACUTE ONLY): 28 min  Past Medical History:  Past Medical History  Diagnosis Date  . Atrial fibrillation ([place])   . Hypertension   . Insomnia   . Nonischemic cardiomyopathy ([place])   . Hyperlipidemia   . Anxiety   . Cholelithiasis   . Insomnia   . Mitral regurgitation     noted 2010  . H/O epistaxis   . Rhinitis, allergic   . CHF (congestive heart failure) ([place])   . Pneumonia     "several times w/exacerbations of the COPD; nothing in the last year" (07/15/2012)  . COPD (chronic obstructive pulmonary disease) ([place])     as of 7/13 on 2-3L, pfts 10/2008 with mod obstruction  . Chronic bronchitis with COPD (chronic obstructive pulmonary disease) ([place])   . Shortness of breath     "all the time right now" (07/15/2012)  . GERD (gastroesophageal reflux disease)   . KVQQVZDG(387.5)     "weekly" (07/15/2012)  . Migraines     "weekly for awhile; cleared up as I got older" (07/15/2012)  . DJD (degenerative joint disease)   . Arthritis     "all over" (07/15/2012)  . OCD (obsessive compulsive disorder)   . OCD (obsessive compulsive disorder)   . Depression     h/o SI; "last time I was really serious about it was ~ 1997" (07/15/2012)  . Thoracic vertebral fracture ([place]) 11/03/2014  . Primary lung cancer [facility])    Past Surgical History:  Past Surgical History  Procedure Laterality Date  . Tubal ligation  1972  . Cardioversion  2003; 07/2003  . Video bronchoscopy Bilateral 02/27/2015    Procedure: VIDEO BRONCHOSCOPY WITH FLUORO;  Surgeon: Chesley Mires, MD;  Location: [facility];  Service: Cardiopulmonary;  Laterality: Bilateral;   HPI: pt is a 70 yo female with COPD exacerbation requiring intubation from 6/20-6/26.   Pt with complex medical hx including recently diagnosed lung cancer s/p XRT, COPD, narcotic dependence, frequent thrush, and multiple hospital admits- x5 in six months, dysphagia x 5-6 months.  CXR 6/28 showed developing right upper lobe infiltrate, pna cannot be ruled out.   Swallow evaluation completed yesterday and pt/daughter report desire for aggressive care therefore instrumental evaluations indicated.    Subjective: pt awake in chair  Assessment / Plan / Recommendation  CHL IP CLINICAL IMPRESSIONS 08/07/2015  Therapy Diagnosis Moderate oral phase dysphagia;Moderate pharyngeal phase dysphagia;Other (Comment)  Clinical Impression Pt presents with multifactorial dysphagia including moderate oropharyngeal deficits with suspected esophageal component.  Oral deficits characterized by decreased oral contol/coordination resulting in premature spillage of liquids into pharynx.    Impairment in timing of swallow/airway closure allowed aspiration *both audible and silent* of thin liquids via tsp, cup and straw. Chin tuck posture with straw nor tsp prevented aspiration.  Pt also presents with baseline cough without coorelation to aspiration, making clinical evaluation challenging.  Pt's pharyngeal swallow fortunately is strong without residuals!  No aspiration/deep penetration of nectar, pudding, cracker or tablet.    Pt did become fatigued during testing quickly with increased dyspnea and swallow ability worsening- more delayed, increased aspiration, etc.     Barium tablet (given with pudding) appeared to lodge at mid=esophagus WITHOUT pt sensation, eventually clearing into stomach with further boluses of pudding/nectar.  However barium (liquid and pudding) continued to be retained in distal esophagus with mild retrograde propulsion at end of study without clearance.  Suspect findings consistent with dysmotility; radiologist not present.   Poor esophageal clearance may increase pt's aspiration risk  postprandial.    Unfortunately, pt unable to have esophagram due to her aspirating during MBS.  using teach back and education "live" occured with pt.   Compensation strategies and diet modification may be beneficial for pt  to mitigate risk.    Recommend dys3/ground meats and nectar thick liquids with strict precautions to mitigate risk - not prevent it.    SLP to follow for pt/family education and pt po tolerance.  Thanks for this consult.   Impact on safety and function Moderate=Severe risk, ongoing      CHL IP TREATMENT RECOMMENDATION 08/06/2015  Treatment Recommendations F/U MBS in --- days (Comment);Defer until completion of intrumental exam     Prognosis 08/07/2015  Prognosis for Safe Diet Advancement Fair  Barriers to Reach Goals Cognitive deficits;Severity of deficits;Time post onset  Barriers/Prognosis Comment --    CHL IP DIET RECOMMENDATION 08/07/2015  SLP Diet Recommendations Dysphagia 3 (Mech soft) solids;Nectar thick liquid  Liquid Administration via Cup;Straw  Medication Administration Whole meds with puree  Compensations Slow rate;Small sips/bites;Follow solids with liquid  Postural Changes Remain semi-upright after after feeds/meals (Comment);Seated upright at 90 degrees      CHL IP OTHER RECOMMENDATIONS 08/07/2015  Recommended Consults --  Oral Care Recommendations Oral care BID  Other Recommendations Prohibited food (jello, ice cream, thin soups);Have oral suction available      CHL IP FOLLOW UP RECOMMENDATIONS 08/07/2015  Follow up Recommendations (No Data)      CHL IP FREQUENCY AND DURATION 08/07/2015  Speech Therapy Frequency (ACUTE ONLY) min 2x/week  Treatment Duration --           CHL IP ORAL PHASE 08/07/2015  Oral Phase Impaired  Oral - Pudding Teaspoon --  Oral - Pudding Cup --  Oral - Honey Teaspoon --  Oral - Honey Cup --  Oral - Nectar Teaspoon Weak lingual manipulation;Premature spillage  Oral - Nectar Cup Weak lingual manipulation;Premature  spillage  Oral - Nectar Straw --  Oral - Thin Teaspoon Weak lingual manipulation;Premature spillage  Oral - Thin Cup Weak lingual manipulation;Premature spillage  Oral - Thin Straw Weak lingual manipulation;Premature spillage  Oral - Puree Weak lingual manipulation;Premature spillage  Oral - Mech Soft Weak lingual manipulation;Piecemeal swallowing  Oral - Regular --  Oral - Multi-Consistency --  Oral - Pill Weak lingual manipulation  Oral Phase - Comment --    CHL IP PHARYNGEAL PHASE 08/07/2015  Pharyngeal Phase Impaired  Pharyngeal- Pudding Teaspoon --  Pharyngeal --  Pharyngeal- Pudding Cup --  Pharyngeal --  Pharyngeal- Honey Teaspoon --  Pharyngeal --  Pharyngeal- Honey Cup --  Pharyngeal --  Pharyngeal- Nectar Teaspoon WFL  Pharyngeal --  Pharyngeal- Nectar Cup WFL  Pharyngeal --  Pharyngeal- Nectar Straw --  Pharyngeal --  Pharyngeal- Thin Teaspoon Trace aspiration;Penetration/Aspiration during swallow  Pharyngeal --  Pharyngeal- Thin Cup Penetration/Aspiration during swallow;Moderate aspiration  Pharyngeal Material enters airway, passes BELOW cords without attempt by patient to eject out (silent aspiration)  Pharyngeal- Thin Straw Significant aspiration (Amount);Penetration/Aspiration during swallow  Pharyngeal Material enters airway, passes BELOW cords without attempt by patient to eject out (silent aspiration);Material enters airway, passes BELOW cords and not ejected out despite cough attempt by patient  Pharyngeal- Puree WFL  Pharyngeal --  Pharyngeal- Mechanical Soft  WFL  Pharyngeal --  Pharyngeal- Regular --  Pharyngeal --  Pharyngeal- Multi-consistency --  Pharyngeal --  Pharyngeal- Pill WFL  Pharyngeal --  Pharyngeal Comment timing of airway closure impacting airway protection negatively, chin tuck posture did not prevent aspiration, swallow worsened as testing progressed with increased amount of thin aspiration      CHL IP CERVICAL ESOPHAGEAL PHASE  08/07/2015  Cervical Esophageal Phase Impaired  Pudding Teaspoon --  Pudding Cup --  Honey Teaspoon --  Honey Cup --  Nectar Teaspoon --  Nectar Cup --  Nectar Straw --  Thin Teaspoon --  Thin Cup --  Thin Straw --  Puree --  Mechanical Soft --  Regular --  Multi-consistency --  Pill --  Cervical Esophageal Comment barium tablet (given with pudding) appeared to lodge at mid=esophagus WITHOUT pt sensation, eventually clearing into stomach with further boluses of pudding/nectar; however barium (liquid and pudding) continued to be retained in distal esophagus with mild retrograde propulsion; suspect findings consistent with dysmotility; radiologist not present     Luanna Salk, [facility] [phone]

## 2015-08-07 NOTE — Progress Notes (Signed)
Nutrition Follow-up  DOCUMENTATION CODES:   Not applicable  INTERVENTION:  - RD will follow-up 6/29 to monitor for needs with diet advancement and decisions following family meeting with Palliative Care.  NUTRITION DIAGNOSIS:   Inadequate oral intake related to inability to eat as evidenced by NPO status. -ongoing at this time  GOAL:   Patient will meet greater than or equal to 90% of their needs -unmet with pt NPO since admission.  MONITOR:   Diet advancement, Weight trends, Labs, I & O's  ASSESSMENT:   70 y.o. female with PMH as outlined below including COPD on chronic 2 - 3 L home O2 and '10mg'$  prednisone (followed by [name]) and stage II B NSCLC s/p XRT (followed by Dr. Earlie Server). She had recent admission 07/15/15 through 07/18/15 for CAP and AECOPD.   6/28 Pt remains NPO at this time. Palliative Care following pt and note from 6/27 indicates plan for meeting with daughter today to complete advanced directives.   SLP saw pt yesterday and again this AM. MBS completed this AM with note at 1035. SLP recommends Dysphagia 3, nectar-thick liquids.  RD will follow-up tomorrow once diet advanced to monitor for needs. Medications reviewed; 40 mg IV Solu-medrol every 12 hours. Labs reviewed; Cl: 100 mmol/L, BUN: 30 mg/dL, Ca: 8.4 mg/dL.   6/26 - Pt extubated this AM with OGT now out; TF d/c'ed.  - Nutrition needs updated based on this event and admission weight of 77.11 kg used to estimate needs given weight continuing to trend up. - Pt remains NPO at this time.   6/23 - Pt continues with OGT in place.  - No family or visitors present again this AM, but sitter at bedside. - Kennon Holter reports that pt continues to have some agitation when touched; bilateral mitten restraints remain in place.  - Continue to be unable to perform physical assessment but no muscle or fat wasting visible to upper body.  - Nutrition needs re-estimated based on current Tmax and Ve and admission weight  (77.11 kg) used in calculation as weight has been trending up.  - Patient is currently intubated on ventilator support; MV: 7.1 L/min; Temp (24hrs), Max:100.6 F (38.1 C); Propofol: none - Pt currently receiving Vital 1.2 @ 60 mL/hr with 50 mL free water every 6 hours. This regimen is providing 1728 kcal, 108 grams of protein, and 1368 mL free water.  Drips: Fentanyl @ 200 mcg/hr, Precedex @ 1 mcg/kg/hr.    Diet Order:  Diet NPO time specified  Skin:  Reviewed, no issues  Last BM:  PTA  Height:   Ht Readings from Last 1 Encounters:  07/31/15 '5\' 5"'$  (1.651 m)    Weight:   Wt Readings from Last 1 Encounters:  08/07/15 189 lb 9.5 oz (86 kg)    Ideal Body Weight:  56.82 kg (kg)  BMI:  Body mass index is 31.55 kg/(m^2).  Estimated Nutritional Needs:   Kcal:  1900-2100  Protein:  90-100 grams  Fluid:  2 L/day  EDUCATION NEEDS:   No education needs identified at this time     Jarome Matin, MS, RD, LDN Inpatient Clinical Dietitian Pager # [phone] After hours/weekend pager # [phone]

## 2015-08-07 NOTE — Progress Notes (Signed)
Speech Language Pathology Treatment: Dysphagia  Patient Details Name: Kelsey Garcia MRN: [id number] DOB: 08-05-1945 Today's Date: 08/07/2015 Time: 0810-0820 SLP Time Calculation (min) (ACUTE ONLY): 10 min  Assessment / Plan / Recommendation Clinical Impression  Pt demonstrating improved breathing ability and swallowing per her report.  SlP observed her consuming ice chips = multiple swallows with delayed productive cough to viscous yellow tinged secretions noted.  Again intubation x6 days and concerns for possible edema impacting pharyngeal swallow present.  Pt did not report globus prior to po administration but did report sensation of lodging in proximal esophagus with minimal amount given.   Note palliative referral planned for today and upon discussion with pt, she desires to proceed with instrumental evaluation prior to meeting to allow more information for decisions.  Pt educated to plan and agreeable. Obtained orders and MBS planned for 9 am followed by esophagram.  Thanks for this order.     HPI HPI: pt is a 70 yo female with COPD exacerbation requiring intubation from 6/20-6/26.  Pt with complex medical hx including recently diagnosed lung cancer s/p XRT, COPD, narcotic dependence, frequent thrush, and multiple hospital admits- x5 in six months, dysphagia x 5-6 months.  CXR showed small left pleural effusion and lower lobe infiltrate.  Swallow evaluation completed yesterday and pt/daughter report desire for aggressive care therefore instrumental evaluations indicated.        SLP Plan  New goals to be determined pending instrumental study     Recommendations  Diet recommendations: NPO             Follow up Recommendations: Other (comment) (tbd) Plan: New goals to be determined pending instrumental study     [place], [facility] SLP [phone]

## 2015-08-07 NOTE — Progress Notes (Signed)
Daily Progress Note   Patient Name: Kelsey Garcia       Date: 08/07/2015 DOB: December 04, 1945  Age: 70 y.o. MRN#: [id number] Attending Physician: Rigoberto Noel, MD Primary Care Physician: Darden Amber, [place] Admit Date: 07/30/2015  Reason for Consultation/Follow-up: Establishing goals of care  Subjective: Patient reports abdominal discomfort that started after she had pills and applesauce.  States she does not know of having a BM since admission.  I met with Gerald Stabs and Wille Glaser first at beside and then in a private conference room. Jon emphasized that:  1.  Kelsey Garcia is not "hospice" ready. 2.  The family strongly feels she needs physical therapy rehab and they request SNF at D/C as the daughter is unable to care for her mother at home. 3.  They are concerned that she receive the appropriate pain management medications and medications for her panic attacks at the time of discharge from the hospital. 4.  We reviewed advanced directives.  The family agreed to complete these and let me know when they are finished. 5.  Family prefers patient remain partial code - and allow for re-intubation if needed.   The family feels that Kelsey Garcia has a good quality of life if she can recognize them and have a laugh with them.  They are willing to accept artificial feeding if she still had that level of function.  They would not want her life prolonged if she could not recognize them or communicate.    We discussed her chronic conditions (COPD, CHF, Afib) that would not improve and her new issue of aspiration.  Family is aware that recurrent aspiration pneumonia is a terminal condition.    Length of Stay: 8  Current Medications: Scheduled Meds:  . antiseptic oral rinse  7 mL Mouth Rinse BID  . aspirin  325 mg Per  Tube Daily  . busPIRone  10 mg Per Tube TID  . diazepam  2 mg Oral Q8H  . diltiazem  120 mg Oral Q8H  . FLUoxetine  20 mg Oral Daily  . heparin  5,000 Units Subcutaneous Q8H  . insulin aspart  0-15 Units Subcutaneous Q4H  . ipratropium-albuterol  3 mL Nebulization Q6H  . levofloxacin (LEVAQUIN) IV  750 mg Intravenous Q24H  . methylPREDNISolone (SOLU-MEDROL) injection  40 mg Intravenous Q12H  .  metoprolol tartrate  12.5 mg Oral BID  . pantoprazole  40 mg Oral Q24H    Continuous Infusions: . sodium chloride 10 mL/hr at 08/07/15 1103  . fentaNYL infusion INTRAVENOUS Stopped (08/05/15 0743)    PRN Meds: albuterol, fentaNYL, midazolam  Physical Exam: Awake  Less Confused.  Wd, chronically ill appearing female.  NAD CV rrr Resp no increased work of breathing, congested cough Abd Soft, nt, nd, +bs Extremities:  1+ edema in LE bilaterally Skin:  Multiple bruises on hands and feet, legs.    Vital Signs: BP 137/83 mmHg  Pulse 106  Temp(Src) 97.8 F (36.6 C) (Oral)  Resp 16  Ht _0  (1.651 m)  Wt 86 kg (189 lb 9.5 oz)  BMI 31.55 kg/m2  SpO2 94% SpO2: SpO2: 94 % O2 Device: O2 Device: Nasal Cannula O2 Flow Rate: O2 Flow Rate (L/min): 4 L/min  Intake/output summary:   Intake/Output Summary (Last 24 hours) at 08/07/15 1327 Last data filed at 08/07/15 1000  Gross per 24 hour  Intake 452.41 ml  Output   1650 ml  Net -1197.59 ml   LBM: Last BM Date:  (PTA) Baseline Weight: Weight: 77.111 kg (170 lb) Most recent weight: Weight: 86 kg (189 lb 9.5 oz)       Palliative Assessment/Data:    Flowsheet Rows        Most Recent Value   Intake Tab    Referral Department  Critical care   Unit at Time of Referral  ICU   Palliative Care Primary Diagnosis  Pulmonary   Date Notified  08/04/15   Palliative Care Type  New Palliative care   Reason for referral  Non-pain Symptom, Clarify Goals of Care   Date of Admission  07/30/15   Date first seen by Palliative Care  08/05/15   #  of days Palliative referral response time  1 Day(s)   # of days IP prior to Palliative referral  5   Clinical Assessment    Palliative Performance Scale Score  30%   Anxiety Max Last 24 Hours  6   Anxiety Min Last 24 Hours  2   Psychosocial & Spiritual Assessment    Palliative Care Outcomes    Patient/Family meeting held?  Yes   Who was at the meeting?  Met with sister Dot, Daughter Gerald Stabs at patient's bedside.      Patient Active Problem List   Diagnosis Date Noted  . Acute hypoxemic respiratory failure ([place])   . Goals of care, counseling/discussion   . Acute delirium   . Acute respiratory failure with hypercapnia ([place])   . Hypocalcemia   . Acute encephalopathy   . AKI (acute kidney injury) ([place])   . Chronic respiratory failure ([place]) 05/22/2015  . Acute exacerbation of chronic obstructive pulmonary disease (COPD) ([place]) 04/04/2015  . Chronic fatigue   . Cancer of lingula of lung ([place]) 03/04/2015  . Palliative care encounter   . Pain in joint, pelvic region and thigh   . Anxiety state   . S/P bronchoscopy with biopsy   . Chronic obstructive pulmonary disease with acute exacerbation ([place])   . Chronic diastolic (congestive) heart failure ([place]) 02/23/2015  . Atrial fibrillation with RVR ([place]) 01/30/2015  . Thoracic vertebral fracture ([place]) 11/03/2014  . Acute on chronic diastolic heart failure ([place])   . Major depressive disorder, recurrent, in full remission with anxious distress ([place]) 07/23/2014  . Narcotic addiction ([place]) 07/12/2014  . Narcotic dependence ([place]) 07/12/2014  . Generalized abdominal pain  07/05/2014  . Chronic pain syndrome 07/02/2014  . COPD exacerbation ([place]) 06/19/2014  . Atrial fibrillation ([place]) 06/19/2014  . Pancytopenia ([place]) 05/15/2014  . Fracture of rib of left side with delayed healing 05/15/2014  . Respiratory failure, chronic ([place]) 12/20/2013  . Neck pain on right side 12/18/2013  . Chronic diastolic CHF (congestive heart failure) ([place]) 10/12/2013    . Osteoporosis 11/29/2012  . Easy bruising 10/01/2012  . Oral thrush 09/24/2012  . Other screening mammogram 12/25/2011  . Screening for colon cancer 12/25/2011  . Impaired mobility 11/26/2011  . 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 Long term current use of opiate analgesic 10/30/2011  . GERD (gastroesophageal reflux disease) 10/30/2011  . Chronic cough 08/28/2011  . Elevated MCV 08/28/2011  . Financial difficulties 04/22/2011  . Preventative health care 10/30/2010  . Anxiety 10/30/2010  . HLD (hyperlipidemia) 05/24/2009  . NICOTINE ADDICTION 01/07/2009  . Osteoarthritis 11/28/2008  . COPD (chronic obstructive pulmonary disease) ([place]) 03/28/2008  . OBSESSIVE-COMPULSIVE DISORDER 12/29/2005  . Depression with anxiety 12/29/2005  . Essential hypertension 12/29/2005  . CARDIOMYOPATHY 12/29/2005  . Atrial fibrillation with rapid ventricular response ([place]) 12/29/2005  . CHOLELITHIASIS 12/29/2005  . INSOMNIA 12/29/2005    Palliative Care Assessment & Plan   Patient Profile: 70 y.o. female with past medical history of COPD on 2-3L of oxygen and prednisone daily at home, diastolic CHF, NSCLCA status post XRT, CAD, paroxysmal atrial fibrillation, OCD, anxiety, opioid dependence, who was admitted on 07/30/2015 with an acute on chronic COPD exacerbation. She had a recent admission June 5 through June 8 for COPD and CAP. She was intubated 6/20 through 6/25, and extubated 6/26 AM. The patient has had multiple trips to the ER and multiple admissions in the past 6 months. PMT was consulted for goals of care.   Assessment: Patient continues to improve.  Much more coherent now.  Unfortunately has aspirated and did not do well on her swallow evaluation.  Recommendations/Plan  Will plan to complete advanced directives.  Family unwilling to change code status at this point. Recommend continuing scheduled valium with PRN versed while patient is on IV steroids. Monitor for improvement of  aspiration.  Goals of Care and Additional Recommendations:  Limitations on Scope of Treatment: Full scope treatment.   Family's main goal is rehab.  They understand she has limits but they want as much rehab as possible.   Needs SNF at dischage.  Sherald Hess lives near [place] and other family members live at [place].  Family will complete then we will order Chaplain consult for witness and notary.  Management of pain / anxiety is a large concern.  Patient has panic attacks and chronic pain.  Current inpatient regimen working well   Family would like for patient to be followed by Palliative Medicine at [facility] after discharge.  Added suppository for abdominal pain / constipation.  Family aware of current potential for aspiration and that recurrent aspiration pneumonia is a terminal condition.   Code Status: Partial     Code Status Orders        Start     Ordered   07/30/15 2205  Limited resuscitation (code)   Continuous    Question Answer Comment  In the event of cardiac or respiratory ARREST: Initiate Code Blue, Call Rapid Response No   In the event of cardiac or respiratory ARREST: Perform CPR No   In the event of cardiac or respiratory ARREST: Perform Intubation/Mechanical Ventilation Yes   In the event  of cardiac or respiratory ARREST: Use NIPPV/BiPAp only if indicated Yes   In the event of cardiac or respiratory ARREST: Administer ACLS medications if indicated No   In the event of cardiac or respiratory ARREST: Perform Defibrillation or Cardioversion if indicated No      07/30/15 2205       Prognosis:   < 6 months based on recurrent hospitalizations, severe COPD with CHF complicated by psychiatric illness.  Discharge Planning:  SNF.  Daughter can not take patient home (stairs).  She does not want her to return to [facility].  Care plan was discussed with bedside RN and daughter Gerald Stabs.  Thank you for allowing the Palliative Medicine  Team to assist in the care of this patient.   Time In: 12:00 Time Out: 1:15 Total Time 75 Prolonged Time Billed yes      Greater than 50%  of this time was spent counseling and coordinating care related to the above assessment and plan.  Imogene Burn, PA-C Palliative Medicine Pager: [phone]  Please contact Palliative Medicine Team phone at [phone] for questions and concerns.

## 2015-08-07 NOTE — Progress Notes (Signed)
MBS completed, full report to follow.  Pt unable to undergo esophagram due to both audible/silent aspiration during MBS.   Luanna Salk, [place] [facility] SLP [phone]

## 2015-08-07 NOTE — Progress Notes (Signed)
Foley left in after discussed with Dr Rolla Etienne  Today.

## 2015-08-07 NOTE — Progress Notes (Signed)
PULMONARY / CRITICAL CARE MEDICINE   Name: Kelsey Garcia MRN: [id number] DOB: 1945-08-12    ADMISSION DATE:  07/30/2015  CHIEF COMPLAINT:  AMS  BRIEF:   Kelsey Garcia is a 70 y.o. female with PMH as outlined below including COPD on chronic 2 - 3 L home O2 and '10mg'$  prednisone (followed by [name]) and stage II B NSCLC s/p XRT (followed by Dr. Earlie Server).  She had recent admission 07/15/15 through 07/18/15 for CAP and AECOPD.  Re-admitted w/ working dx on 6/20 w/ Acute on Chronic resp failure in setting of AECOPD vs early HCAP  SUBJECTIVE:  No issues overnight.  VITAL SIGNS: BP 141/82 mmHg  Pulse 106  Temp(Src) 97.8 F (36.6 C) (Oral)  Resp 21  Ht '5\' 5"'$  (1.651 m)  Wt 189 lb 9.5 oz (86 kg)  BMI 31.55 kg/m2  SpO2 95%  HEMODYNAMICS:    VENTILATOR SETTINGS: Vent Mode:  [-]  FiO2 (%):  [4 %] 4 %  INTAKE / OUTPUT: I/O last 3 completed shifts: In: 975.3 [I.V.:825.3; IV Piggyback:150] Out: 3200 [Urine:3200]  PHYSICAL EXAMINATION: General: Awake, no distress Neuro: No focal deficits HEENT: Moist mucus membranes, no thyromegaly, JVD Cardiac: RRR, No MRG Chest: Scatttered wheeze or crackles Abd: soft, non tender, + BS Ext: No edema Skin: No rashes  LABS:  BMET  Recent Labs Lab 08/05/15 0327 08/06/15 0320 08/07/15 0308  NA 136 136 137  K 4.8 5.4* 4.8  CL 101 101 100*  CO2 '30 31 31  '$ BUN 39* 41* 30*  CREATININE 0.51 0.55 0.44  GLUCOSE 191* 147* 127*    Electrolytes  Recent Labs Lab 07/31/15 1059 08/01/15 0307  08/05/15 0327 08/06/15 0320 08/07/15 0308  CALCIUM  --  8.8*  < > 8.3* 8.5* 8.4*  MG 1.8 2.1  --   --   --   --   PHOS 3.0 3.0  --   --   --   --   < > = values in this interval not displayed.  CBC  Recent Labs Lab 08/05/15 0327 08/06/15 0320 08/07/15 0308  WBC 9.4 11.3* 9.5  HGB 10.0* 10.3* 10.5*  HCT 33.4* 34.3* 34.9*  PLT 124* 129* 125*    Coag's No results for input(s): APTT, INR in the last 168 hours.  Sepsis Markers No results  for input(s): LATICACIDVEN, PROCALCITON, O2SATVEN in the last 168 hours.  ABG  Recent Labs Lab 08/01/15 0348 08/03/15 1352  PHART 7.371 7.357  PCO2ART 49.5* 56.3*  PO2ART 58.1* 64.6*    Liver Enzymes No results for input(s): AST, ALT, ALKPHOS, BILITOT, ALBUMIN in the last 168 hours.  Cardiac Enzymes No results for input(s): TROPONINI, PROBNP in the last 168 hours.  Glucose  Recent Labs Lab 08/06/15 0716 08/06/15 1132 08/06/15 1628 08/06/15 2106 08/06/15 2334 08/07/15 0410  GLUCAP 145* 113* 110* 111* 126* 114*    Imaging Dg Chest Port 1 View  08/07/2015  CLINICAL DATA:  Respiratory failure. EXAM: PORTABLE CHEST 1 VIEW COMPARISON:  08/06/2015, 07/31/2015, 07/30/2015.  CT 07/15/2015. FINDINGS: Mediastinum hilar structures stable. Cardiomegaly with persistent bilateral interstitial prominence suggesting CHF. Developing infiltrate right upper lobe. Small left pleural effusion. No pneumothorax. IMPRESSION: 1. Cardiomegaly with persistent pulmonary interstitial prominence consistent with CHF. Small left pleural effusion. 2. Developing infiltrate right upper lobe. Pneumonia cannot be excluded. Electronically Signed   By: Marcello Moores  Register   On: 08/07/2015 07:14    STUDIES:  03/05/15 PFT >> FEV1 0.74 (31%), FEV1% 40, TLC 3.68 (71%), DLCO  19%  CULTURES: Blood 06/20 >NG Urine 6/20 > multiple species  ANTIBIOTICS: Vanc 06/20 > 6/22 Cefepime 06/20 > 6/22 Levaquin 6/22 >   SIGNIFICANT EVENTS: 06/20 > Admitted with AECOPD   LINES/TUBES: ETT 06/20 > 6/26  DISCUSSION: NSCLC -presumed cured by SBRT.  Admitted with AECOPD. Ongoing discussion regarding goals of care.    ASSESSMENT / PLAN:  PULMONARY A: Acute on chronic hypoxic/hypercapnic respiratory failure 2nd to AECOPD. P:   Wean down Fio2 as tolerated. Scheduled BDs Continue solumedrol at current dose.   CARDIOVASCULAR A:  Hx CAD, chronic diastolic CHF, HTN. P:  Continue ASA, cardizem, lopressor  RENAL A:    No acute issues. P:   Continue lasix for diuresis Monitor BMET and UOP Replace electrolytes as needed  GASTROINTESTINAL A:   GERD. P:   Protonix for stress ulcer prophylaxis Swallow eval  HEMATOLOGIC / ONCOLOGIC A:   Anemia of critical illness and chronic disease. Stage IIB non-small cell lung cancer - s/p XRT (followed by Dr. Julien Nordmann). thrombocytopenia P:  SCD's / Heparin F/u CBC  INFECTIOUS A:   AECOPD. P:  Levaquin x7ds. Last date 6/29 Recheck Pct as CXR today shows ? RUL infiltrate  ENDOCRINE A:   Steroid induced hyperglycemia. P:   SSI  NEUROLOGIC A:   Acute metabolic encephalopathy. Hx anxiety P:   Standing valium as per palliative care Continue out-pt buspirone  Marshell Garfinkel MD [place] Pager [phone] If no answer or after 3pm call: [phone] 08/07/2015, 9:29 AM

## 2015-08-08 ENCOUNTER — Inpatient Hospital Stay (HOSPITAL_COMMUNITY): Payer: Medicare Other

## 2015-08-08 LAB — CBC
HEMATOCRIT: 34.1 % — AB (ref 36.0–46.0)
Hemoglobin: 10.2 g/dL — ABNORMAL LOW (ref 12.0–15.0)
MCH: 26 pg (ref 26.0–34.0)
MCHC: 29.9 g/dL — AB (ref 30.0–36.0)
MCV: 87 fL (ref 78.0–100.0)
Platelets: 131 10*3/uL — ABNORMAL LOW (ref 150–400)
RBC: 3.92 MIL/uL (ref 3.87–5.11)
RDW: 20 % — AB (ref 11.5–15.5)
WBC: 8.9 10*3/uL (ref 4.0–10.5)

## 2015-08-08 LAB — BASIC METABOLIC PANEL
Anion gap: 6 (ref 5–15)
BUN: 24 mg/dL — AB (ref 6–20)
CALCIUM: 8.7 mg/dL — AB (ref 8.9–10.3)
CHLORIDE: 98 mmol/L — AB (ref 101–111)
CO2: 34 mmol/L — AB (ref 22–32)
CREATININE: 0.49 mg/dL (ref 0.44–1.00)
GFR calc non Af Amer: >60 mL/min (ref >60)
Glucose, Bld: 151 mg/dL — ABNORMAL HIGH (ref 65–99)
Potassium: 4.7 mmol/L (ref 3.5–5.1)
SODIUM: 138 mmol/L (ref 135–145)

## 2015-08-08 LAB — GLUCOSE, CAPILLARY
GLUCOSE-CAPILLARY: 143 mg/dL — AB (ref 65–99)
GLUCOSE-CAPILLARY: 141 mg/dL — AB (ref 65–99)
GLUCOSE-CAPILLARY: 145 mg/dL — AB (ref 65–99)
GLUCOSE-CAPILLARY: 151 mg/dL — AB (ref 65–99)
Glucose-Capillary: 163 mg/dL — ABNORMAL HIGH (ref 65–99)
Glucose-Capillary: 141 mg/dL — ABNORMAL HIGH (ref 65–99)

## 2015-08-08 MED ORDER — METHYLPREDNISOLONE SODIUM SUCC 40 MG IJ SOLR
40.0000 mg | Freq: Every day | INTRAMUSCULAR | Status: DC
  Administered 2015-08-09: 40 mg via INTRAVENOUS
  Filled 2015-08-08: qty 1

## 2015-08-08 MED ORDER — STARCH (THICKENING) PO POWD
ORAL | Status: DC | PRN
  Filled 2015-08-08: qty 227

## 2015-08-08 MED ORDER — INSULIN ASPART 100 UNIT/ML ~~LOC~~ SOLN
0.0000 [IU] | Freq: Three times a day (TID) | SUBCUTANEOUS | Status: DC
  Administered 2015-08-08: 3 [IU] via SUBCUTANEOUS
  Administered 2015-08-08 – 2015-08-09 (×3): 2 [IU] via SUBCUTANEOUS

## 2015-08-08 NOTE — Progress Notes (Signed)
PULMONARY / CRITICAL CARE MEDICINE   Name: CANDELA KRUL MRN: [id number] DOB: 05-Jul-1945    ADMISSION DATE:  07/30/2015  CHIEF COMPLAINT:  AMS  BRIEF:   EVELEAN BIGLER is a 70 y.o. female with PMH as outlined below including COPD on chronic 2 - 3 L home O2 and '10mg'$  prednisone (followed by [name]) and stage II B NSCLC s/p XRT (followed by Dr. Earlie Server).  She had recent admission 07/15/15 through 07/18/15 for CAP and AECOPD.  Re-admitted w/ working dx on 6/20 w/ Acute on Chronic resp failure in setting of AECOPD vs early HCAP  SUBJECTIVE:  No issues overnight. PT eval ordered  VITAL SIGNS: BP 106/71 mmHg  Pulse 115  Temp(Src) 98 F (36.7 C) (Oral)  Resp 12  Ht '5\' 5"'$  (1.651 m)  Wt 189 lb 9.5 oz (86 kg)  BMI 31.55 kg/m2  SpO2 96%  HEMODYNAMICS:    VENTILATOR SETTINGS:    INTAKE / OUTPUT: I/O last 3 completed shifts: In: 360 [P.O.:60; I.V.:140; Other:10; IV Piggyback:150] Out: 2235 [Urine:2235]  PHYSICAL EXAMINATION: General: Awake, no distress, answers questions. Neuro: No focal deficits HEENT: Moist mucus membranes, no thyromegaly, JVD Cardiac: RRR, No MRG Chest: Decreases bs bases, adequate air movement Abd: soft, non tender, + BS Ext: No edema Skin: No rashes  LABS:  BMET  Recent Labs Lab 08/06/15 0320 08/07/15 0308 08/08/15 0319  NA 136 137 138  K 5.4* 4.8 4.7  CL 101 100* 98*  CO2 31 31 34*  BUN 41* 30* 24*  CREATININE 0.55 0.44 0.49  GLUCOSE 147* 127* 151*    Electrolytes  Recent Labs Lab 08/06/15 0320 08/07/15 0308 08/08/15 0319  CALCIUM 8.5* 8.4* 8.7*    CBC  Recent Labs Lab 08/06/15 0320 08/07/15 0308 08/08/15 0319  WBC 11.3* 9.5 8.9  HGB 10.3* 10.5* 10.2*  HCT 34.3* 34.9* 34.1*  PLT 129* 125* 131*    Coag's No results for input(s): APTT, INR in the last 168 hours.  Sepsis Markers  Recent Labs Lab 08/07/15 0308  PROCALCITON <0.10    ABG  Recent Labs Lab 08/03/15 1352  PHART 7.357  PCO2ART 56.3*  PO2ART 64.6*     Liver Enzymes No results for input(s): AST, ALT, ALKPHOS, BILITOT, ALBUMIN in the last 168 hours.  Cardiac Enzymes No results for input(s): TROPONINI, PROBNP in the last 168 hours.  Glucose  Recent Labs Lab 08/07/15 1248 08/07/15 1550 08/07/15 1933 08/07/15 2321 08/08/15 0453 08/08/15 0742  GLUCAP 146* 155* 121* 143* 141* 163*    Imaging Dg Chest Port 1 View  08/08/2015  CLINICAL DATA:  Respiratory failure. EXAM: PORTABLE CHEST 1 VIEW COMPARISON:  08/07/2015. FINDINGS: Mediastinum and hilar structures are normal. Stable cardiomegaly. Interim improvement of pulmonary venous congestion interstitial edema. Mild infiltrate left lower lobe with small left pleural effusion. Partial clearing of right upper lobe infiltrate. IMPRESSION: 1. Stable cardiomegaly. Interim improvement pulmonary venous congestion and interstitial edema. 2. Mild persistent left lower lobe infiltrate and small left pleural effusion. Interim partial clearing of right upper lobe infiltrate . Electronically Signed   By: Marcello Moores  Register   On: 08/08/2015 07:06   Dg Swallowing Func-speech Pathology  08/07/2015  Objective Swallowing Evaluation: Type of Study: MBS-Modified Barium Swallow Study Patient Details Name: EVANGELIA WHITAKER MRN: [id number] Date of Birth: [birth year] Today's Date: 08/07/2015 Time: SLP Start Time (ACUTE ONLY): 0931-SLP Stop Time (ACUTE ONLY): 0959 SLP Time Calculation (min) (ACUTE ONLY): 28 min Past Medical History: Past Medical History Diagnosis Date .  Atrial fibrillation ([place])  . Hypertension  . Insomnia  . Nonischemic cardiomyopathy ([place])  . Hyperlipidemia  . Anxiety  . Cholelithiasis  . Insomnia  . Mitral regurgitation    noted 2010 . H/O epistaxis  . Rhinitis, allergic  . CHF (congestive heart failure) ([place])  . Pneumonia    "several times w/exacerbations of the COPD; nothing in the last year" (07/15/2012) . COPD (chronic obstructive pulmonary disease) ([place])    as of 7/13 on 2-3L, pfts 10/2008 with mod  obstruction . Chronic bronchitis with COPD (chronic obstructive pulmonary disease) ([place])  . Shortness of breath    "all the time right now" (07/15/2012) . GERD (gastroesophageal reflux disease)  . CXKGYJEH(631.4)    "weekly" (07/15/2012) . Migraines    "weekly for awhile; cleared up as I got older" (07/15/2012) . DJD (degenerative joint disease)  . Arthritis    "all over" (07/15/2012) . OCD (obsessive compulsive disorder)  . OCD (obsessive compulsive disorder)  . Depression    h/o SI; "last time I was really serious about it was ~ 1997" (07/15/2012) . Thoracic vertebral fracture ([place]) 11/03/2014 . Primary lung cancer [facility])  Past Surgical History: Past Surgical History Procedure Laterality Date . Tubal ligation  1972 . Cardioversion  2003; 07/2003 . Video bronchoscopy Bilateral 02/27/2015   Procedure: VIDEO BRONCHOSCOPY WITH FLUORO;  Surgeon: Chesley Mires, MD;  Location: [facility];  Service: Cardiopulmonary;  Laterality: Bilateral; HPI: pt is a 70 yo female with COPD exacerbation requiring intubation from 6/20-6/26.  Pt with complex medical hx including recently diagnosed lung cancer s/p XRT, COPD, narcotic dependence, frequent thrush, and multiple hospital admits- x5 in six months, dysphagia x 5-6 months.  CXR 6/28 showed developing right upper lobe infiltrate, pna cannot be ruled out.   Swallow evaluation completed yesterday and pt/daughter report desire for aggressive care therefore instrumental evaluations indicated.   Subjective: pt awake in chair Assessment / Plan / Recommendation CHL IP CLINICAL IMPRESSIONS 08/07/2015 Therapy Diagnosis Moderate oral phase dysphagia;Moderate pharyngeal phase dysphagia;Other (Comment) Clinical Impression Pt presents with multifactorial dysphagia including moderate oropharyngeal deficits with suspected esophageal component.  Oral deficits characterized by decreased oral contol/coordination resulting in premature spillage of liquids into pharynx.  Impairment in timing of swallow/airway  closure allowed aspiration *both audible and silent* of thin liquids via tsp, cup and straw. Chin tuck posture with straw nor tsp prevented aspiration.  Pt also presents with baseline cough without coorelation to aspiration, making clinical evaluation challenging.  Pt's pharyngeal swallow fortunately is strong without residuals!  No aspiration/deep penetration of nectar, pudding, cracker or tablet.  Pt did become fatigued during testing quickly with increased dyspnea and swallow ability worsening- more delayed, increased aspiration, etc.   Barium tablet (given with pudding) appeared to lodge at mid=esophagus WITHOUT pt sensation, eventually clearing into stomach with further boluses of pudding/nectar.  However barium (liquid and pudding) continued to be retained in distal esophagus with mild retrograde propulsion at end of study without clearance.  Suspect findings consistent with dysmotility; radiologist not present.   Poor esophageal clearance may increase pt's aspiration risk postprandial.  Unfortunately, pt unable to have esophagram due to her aspirating during MBS.  using teach back and education "live" occured with pt.   Compensation strategies and diet modification may be beneficial for pt  to mitigate risk.  Recommend dys3/ground meats and nectar thick liquids with strict precautions.  SLP to follow for pt/family education and pt po tolerance.  Thanks for this consult.  Impact on safety and function --  CHL IP TREATMENT RECOMMENDATION 08/06/2015 Treatment Recommendations F/U MBS in --- days (Comment);Defer until completion of intrumental exam   Prognosis 08/07/2015 Prognosis for Safe Diet Advancement Fair Barriers to Reach Goals Cognitive deficits;Severity of deficits;Time post onset Barriers/Prognosis Comment -- CHL IP DIET RECOMMENDATION 08/07/2015 SLP Diet Recommendations Dysphagia 3 (Mech soft) solids;Nectar thick liquid Liquid Administration via Cup;Straw Medication Administration Whole meds with puree  Compensations Slow rate;Small sips/bites;Follow solids with liquid Postural Changes Remain semi-upright after after feeds/meals (Comment);Seated upright at 90 degrees   CHL IP OTHER RECOMMENDATIONS 08/07/2015 Recommended Consults -- Oral Care Recommendations Oral care BID Other Recommendations Prohibited food (jello, ice cream, thin soups);Have oral suction available   CHL IP FOLLOW UP RECOMMENDATIONS 08/07/2015 Follow up Recommendations (No Data)   CHL IP FREQUENCY AND DURATION 08/07/2015 Speech Therapy Frequency (ACUTE ONLY) min 2x/week Treatment Duration --      CHL IP ORAL PHASE 08/07/2015 Oral Phase Impaired Oral - Pudding Teaspoon -- Oral - Pudding Cup -- Oral - Honey Teaspoon -- Oral - Honey Cup -- Oral - Nectar Teaspoon Weak lingual manipulation;Premature spillage Oral - Nectar Cup Weak lingual manipulation;Premature spillage Oral - Nectar Straw -- Oral - Thin Teaspoon Weak lingual manipulation;Premature spillage Oral - Thin Cup Weak lingual manipulation;Premature spillage Oral - Thin Straw Weak lingual manipulation;Premature spillage Oral - Puree Weak lingual manipulation;Premature spillage Oral - Mech Soft Weak lingual manipulation;Piecemeal swallowing Oral - Regular -- Oral - Multi-Consistency -- Oral - Pill Weak lingual manipulation Oral Phase - Comment --  CHL IP PHARYNGEAL PHASE 08/07/2015 Pharyngeal Phase Impaired Pharyngeal- Pudding Teaspoon -- Pharyngeal -- Pharyngeal- Pudding Cup -- Pharyngeal -- Pharyngeal- Honey Teaspoon -- Pharyngeal -- Pharyngeal- Honey Cup -- Pharyngeal -- Pharyngeal- Nectar Teaspoon WFL Pharyngeal -- Pharyngeal- Nectar Cup WFL Pharyngeal -- Pharyngeal- Nectar Straw -- Pharyngeal -- Pharyngeal- Thin Teaspoon Trace aspiration;Penetration/Aspiration during swallow Pharyngeal -- Pharyngeal- Thin Cup Penetration/Aspiration during swallow;Moderate aspiration Pharyngeal Material enters airway, passes BELOW cords without attempt by patient to eject out (silent aspiration) Pharyngeal- Thin  Straw Significant aspiration (Amount);Penetration/Aspiration during swallow Pharyngeal Material enters airway, passes BELOW cords without attempt by patient to eject out (silent aspiration);Material enters airway, passes BELOW cords and not ejected out despite cough attempt by patient Pharyngeal- Puree WFL Pharyngeal -- Pharyngeal- Mechanical Soft WFL Pharyngeal -- Pharyngeal- Regular -- Pharyngeal -- Pharyngeal- Multi-consistency -- Pharyngeal -- Pharyngeal- Pill WFL Pharyngeal -- Pharyngeal Comment timing of airway closure impacting airway protection negatively, chin tuck posture did not prevent aspiration, swallow worsened as testing progressed with increased amount of thin aspiration   CHL IP CERVICAL ESOPHAGEAL PHASE 08/07/2015 Cervical Esophageal Phase Impaired Pudding Teaspoon -- Pudding Cup -- Honey Teaspoon -- Honey Cup -- Nectar Teaspoon -- Nectar Cup -- Nectar Straw -- Thin Teaspoon -- Thin Cup -- Thin Straw -- Puree -- Mechanical Soft -- Regular -- Multi-consistency -- Pill -- Cervical Esophageal Comment barium tablet (given with pudding) appeared to lodge at mid=esophagus WITHOUT pt sensation, eventually clearing into stomach with further boluses of pudding/nectar; however barium (liquid and pudding) continued to be retained in distal esophagus with mild retrograde propulsion; suspect findings consistent with dysmotility; radiologist not present Luanna Salk, [facility] SLP [phone]               STUDIES:  03/05/15 PFT >> FEV1 0.74 (31%), FEV1% 40, TLC 3.68 (71%), DLCO 19%  CULTURES: Blood 06/20 >NG Urine 6/20 > multiple species  ANTIBIOTICS: Vanc 06/20 > 6/22 Cefepime 06/20 > 6/22 Levaquin 6/22 >   SIGNIFICANT EVENTS: 06/20 > Admitted  with AECOPD   LINES/TUBES: ETT 06/20 > 6/26  DISCUSSION: NSCLC -presumed cured by SBRT.  Admitted with AECOPD. Ongoing discussion regarding goals of care.    ASSESSMENT / PLAN:  PULMONARY A: Acute on chronic hypoxic/hypercapnic respiratory failure  2nd to AECOPD. P:   Wean down Fio2 as tolerated. Scheduled BDs Continue solumedrol at current dose.   CARDIOVASCULAR A:  Hx CAD, chronic diastolic CHF, HTN. P:  Continue ASA, cardizem, lopressor  RENAL A:   No acute issues. P:   Continue lasix for diuresis Monitor BMET and UOP Replace electrolytes as needed  GASTROINTESTINAL A:   GERD. P:   Protonix for stress ulcer prophylaxis Swallow eval as noted dys 3  HEMATOLOGIC / ONCOLOGIC  Recent Labs  08/07/15 0308 08/08/15 0319  HGB 10.5* 10.2*    A:   Anemia of critical illness and chronic disease. Stage IIB non-small cell lung cancer - s/p XRT (followed by Dr. Julien Nordmann). thrombocytopenia P:  SCD's / Heparin F/u CBC  INFECTIOUS 6/29 procal>><0.01 A:   AECOPD. P:  Levaquin x7ds. Last date 6/29 Procal neg 6/28  ENDOCRINE A:   Steroid induced hyperglycemia. P:   SSI  NEUROLOGIC A:   Acute metabolic encephalopathy. Hx anxiety P:   Standing valium as per palliative care Continue out-pt buspirone PT evaluation   ?transfer to floor and to [facility] in am.  Richardson Landry Minor ACNP Maryanna Shape PCCM Pager [phone] till 3 pm If no answer page [phone] 08/08/2015, 9:09 AM   Attending note: I have seen and examined the patient with nurse practitioner/resident and agree with the note. History, labs and imaging reviewed.  70 y/o with severe steroid dependent COPD, recurrent exacerbations admitted with PNA, AECOPD Extubated earlier this week Resp status is slowly improving Ongoing discussions with palliative care. Pt and family want DNR but ok to reintubate if needed. Continue current management.  Marshell Garfinkel MD [facility] Pager [phone] If no answer or after 3pm call: [phone] 08/08/2015, 12:37 PM

## 2015-08-08 NOTE — Progress Notes (Signed)
Nutrition Follow-up  DOCUMENTATION CODES:   Not applicable  INTERVENTION:  - Continue current diet per SLP recommendation: Dysphagia 3 with thin liquids. - RD will continue to monitor for needs.  NUTRITION DIAGNOSIS:   Inadequate oral intake related to inability to eat as evidenced by NPO status. -improving with diet advancement and improving appetite.  GOAL:   Patient will meet greater than or equal to 90% of their needs -unmet at this time.  MONITOR:   PO intake, Weight trends, Labs, I & O's  ASSESSMENT:   70 y.o. female with PMH as outlined below including COPD on chronic 2 - 3 L home O2 and 14m prednisone (followed by Dr. AElsworth Soho and stage II B NSCLC s/p XRT (followed by Dr. MEarlie Server. She had recent admission 07/15/15 through 07/18/15 for CAP and AECOPD.   6/29 Diet advanced yesterday afternoon with no intakes documented. Pt reports she had bacon and eggs for breakfast and denies abdominal pain or nausea with intakes. She states she did experience some coughing during this meal. Pt drowsy and nodded off to sleep several times during visit; no further questions asked at this time to allow her to rest and no family/visitors present.  Palliative Care met with family yesterday. Physical assessment performed during this visit and shows no muscle or fat wasting, mild edema present to extremities. No documented BM throughout admission. Pt not meeting needs with diet advanced from NPO yesterday; pt previously NPO since admission. Will continue to monitor for needs. Medications reviewed. Labs reviewed; CBGs: 141 and 163 mg/dL today, Cl: 98 mmol/L, BUN: 24 mg/dL, Ca: 8.7 mg/dL.   6/28 - Pt remains NPO at this time. -  Palliative Care following pt and note from 6/27 indicates plan for meeting with daughter today to complete advanced directives.  - SLP saw pt yesterday and again this AM.  - MBS completed this AM with note at 1035. SLP recommends Dysphagia 3, nectar-thick liquids. - RD  will follow-up tomorrow once diet advanced to monitor for needs.    6/26 - Pt extubated this AM with OGT now out; TF d/c'ed.  - Nutrition needs updated based on this event and admission weight of 77.11 kg used to estimate needs given weight continuing to trend up. - Pt remains NPO at this time.    Diet Order:  DIET DYS 3 Room service appropriate?: Yes; Fluid consistency:: Thin  Skin:  Reviewed, no issues  Last BM:  PTA  Height:   Ht Readings from Last 1 Encounters:  07/31/15 5' 5"  (1.651 m)    Weight:   Wt Readings from Last 1 Encounters:  08/08/15 189 lb 9.5 oz (86 kg)    Ideal Body Weight:  56.82 kg (kg)  BMI:  Body mass index is 31.55 kg/(m^2).  Estimated Nutritional Needs:   Kcal:  1900-2100  Protein:  90-100 grams  Fluid:  2 L/day  EDUCATION NEEDS:   No education needs identified at this time     JJarome Matin MS, RD, LDN Inpatient Clinical Dietitian Pager # [phone] hours/weekend pager # [phone]

## 2015-08-09 ENCOUNTER — Inpatient Hospital Stay
Admission: RE | Admit: 2015-08-09 | Discharge: 2015-08-27 | Disposition: A | Discharge status: Other Acute Inpatient Hospital | Payer: Medicare Other | Source: Intra-hospital | Attending: Internal Medicine | Admitting: Internal Medicine

## 2015-08-09 ENCOUNTER — Inpatient Hospital Stay: Admission: RE | Admit: 2015-08-09 | Payer: Medicare Other | Source: Ambulatory Visit

## 2015-08-09 ENCOUNTER — Ambulatory Visit: Payer: Medicare Other

## 2015-08-09 ENCOUNTER — Other Ambulatory Visit (HOSPITAL_COMMUNITY): Payer: Medicare Other

## 2015-08-09 DIAGNOSIS — C3490 Malignant neoplasm of unspecified part of unspecified bronchus or lung: Secondary | ICD-10-CM

## 2015-08-09 DIAGNOSIS — Z4659 Encounter for fitting and adjustment of other gastrointestinal appliance and device: Secondary | ICD-10-CM

## 2015-08-09 DIAGNOSIS — J189 Pneumonia, unspecified organism: Secondary | ICD-10-CM

## 2015-08-09 DIAGNOSIS — R229 Localized swelling, mass and lump, unspecified: Secondary | ICD-10-CM

## 2015-08-09 DIAGNOSIS — M272 Inflammatory conditions of jaws: Secondary | ICD-10-CM

## 2015-08-09 DIAGNOSIS — R221 Localized swelling, mass and lump, neck: Secondary | ICD-10-CM

## 2015-08-09 DIAGNOSIS — K5900 Constipation, unspecified: Secondary | ICD-10-CM

## 2015-08-09 DIAGNOSIS — J9601 Acute respiratory failure with hypoxia: Secondary | ICD-10-CM | POA: Insufficient documentation

## 2015-08-09 DIAGNOSIS — J9690 Respiratory failure, unspecified, unspecified whether with hypoxia or hypercapnia: Secondary | ICD-10-CM

## 2015-08-09 DIAGNOSIS — R0603 Acute respiratory distress: Secondary | ICD-10-CM

## 2015-08-09 DIAGNOSIS — I469 Cardiac arrest, cause unspecified: Secondary | ICD-10-CM | POA: Insufficient documentation

## 2015-08-09 DIAGNOSIS — J449 Chronic obstructive pulmonary disease, unspecified: Secondary | ICD-10-CM

## 2015-08-09 DIAGNOSIS — IMO0002 Reserved for concepts with insufficient information to code with codable children: Secondary | ICD-10-CM

## 2015-08-09 LAB — BASIC METABOLIC PANEL
ANION GAP: 3 — AB (ref 5–15)
BUN: 22 mg/dL — AB (ref 6–20)
CHLORIDE: 101 mmol/L (ref 101–111)
CO2: 35 mmol/L — AB (ref 22–32)
Calcium: 8.6 mg/dL — ABNORMAL LOW (ref 8.9–10.3)
Creatinine, Ser: 0.47 mg/dL (ref 0.44–1.00)
GFR calc Af Amer: >60 mL/min (ref >60)
GLUCOSE: 113 mg/dL — AB (ref 65–99)
POTASSIUM: 4.6 mmol/L (ref 3.5–5.1)
SODIUM: 139 mmol/L (ref 135–145)

## 2015-08-09 LAB — MAGNESIUM: Magnesium: 2.4 mg/dL (ref 1.7–2.4)

## 2015-08-09 LAB — CBC
HCT: 33 % — ABNORMAL LOW (ref 36.0–46.0)
Hemoglobin: 10 g/dL — ABNORMAL LOW (ref 12.0–15.0)
MCH: 26.5 pg (ref 26.0–34.0)
MCHC: 30.3 g/dL (ref 30.0–36.0)
MCV: 87.5 fL (ref 78.0–100.0)
PLATELETS: 127 10*3/uL — AB (ref 150–400)
RBC: 3.77 MIL/uL — AB (ref 3.87–5.11)
RDW: 20.2 % — AB (ref 11.5–15.5)
WBC: 9.3 10*3/uL (ref 4.0–10.5)

## 2015-08-09 LAB — COMPREHENSIVE METABOLIC PANEL
ALT: 47 U/L (ref 14–54)
ANION GAP: 7 (ref 5–15)
AST: 28 U/L (ref 15–41)
Albumin: 2.8 g/dL — ABNORMAL LOW (ref 3.5–5.0)
Alkaline Phosphatase: 56 U/L (ref 38–126)
BUN: 24 mg/dL — ABNORMAL HIGH (ref 6–20)
CHLORIDE: 100 mmol/L — AB (ref 101–111)
CO2: 32 mmol/L (ref 22–32)
CREATININE: 0.53 mg/dL (ref 0.44–1.00)
Calcium: 8.8 mg/dL — ABNORMAL LOW (ref 8.9–10.3)
Glucose, Bld: 193 mg/dL — ABNORMAL HIGH (ref 65–99)
POTASSIUM: 5 mmol/L (ref 3.5–5.1)
Sodium: 139 mmol/L (ref 135–145)
Total Bilirubin: 1.2 mg/dL (ref 0.3–1.2)
Total Protein: 5 g/dL — ABNORMAL LOW (ref 6.5–8.1)

## 2015-08-09 LAB — CBC WITH DIFFERENTIAL/PLATELET
BASOS PCT: 0 %
Basophils Absolute: 0 10*3/uL (ref 0.0–0.1)
EOS ABS: 0 10*3/uL (ref 0.0–0.7)
EOS PCT: 0 %
HCT: 36.1 % (ref 36.0–46.0)
Hemoglobin: 10.4 g/dL — ABNORMAL LOW (ref 12.0–15.0)
Lymphocytes Relative: 5 %
Lymphs Abs: 0.5 10*3/uL — ABNORMAL LOW (ref 0.7–4.0)
MCH: 25.6 pg — ABNORMAL LOW (ref 26.0–34.0)
MCHC: 28.8 g/dL — AB (ref 30.0–36.0)
MCV: 88.9 fL (ref 78.0–100.0)
MONO ABS: 0.3 10*3/uL (ref 0.1–1.0)
MONOS PCT: 3 %
NEUTROS ABS: 8.7 10*3/uL — AB (ref 1.7–7.7)
NEUTROS PCT: 92 %
PLATELETS: 132 10*3/uL — AB (ref 150–400)
RBC: 4.06 MIL/uL (ref 3.87–5.11)
RDW: 20.1 % — AB (ref 11.5–15.5)
WBC: 9.4 10*3/uL (ref 4.0–10.5)

## 2015-08-09 LAB — PHOSPHORUS: Phosphorus: 4.2 mg/dL (ref 2.5–4.6)

## 2015-08-09 LAB — PROTIME-INR
INR: 1.04 (ref 0.00–1.49)
PROTHROMBIN TIME: 13.8 s (ref 11.6–15.2)

## 2015-08-09 LAB — GLUCOSE, CAPILLARY
GLUCOSE-CAPILLARY: 120 mg/dL — AB (ref 65–99)
Glucose-Capillary: 122 mg/dL — ABNORMAL HIGH (ref 65–99)

## 2015-08-09 LAB — TSH: TSH: 0.479 u[IU]/mL (ref 0.350–4.500)

## 2015-08-09 LAB — PROCALCITONIN: Procalcitonin: <0.1 ng/mL

## 2015-08-09 LAB — T4, FREE: FREE T4: 1.16 ng/dL — AB (ref 0.61–1.12)

## 2015-08-09 MED ORDER — METOPROLOL TARTRATE 25 MG PO TABS: 12.5000 mg | ORAL_TABLET | Freq: Two times a day (BID) | ORAL | Status: AC

## 2015-08-09 MED ORDER — PANTOPRAZOLE SODIUM 40 MG PO TBEC: 40.0000 mg | DELAYED_RELEASE_TABLET | ORAL | Status: AC

## 2015-08-09 MED ORDER — FLUOXETINE HCL 20 MG PO CAPS: 20.0000 mg | ORAL_CAPSULE | Freq: Every day | ORAL | Status: AC

## 2015-08-09 MED ORDER — STARCH (THICKENING) PO POWD: 1.0000 | ORAL | Status: AC | PRN

## 2015-08-09 MED ORDER — HEPARIN SODIUM (PORCINE) 5000 UNIT/ML IJ SOLN: 5000.0000 [IU] | Freq: Three times a day (TID) | INTRAMUSCULAR | Status: AC

## 2015-08-09 MED ORDER — ALBUTEROL SULFATE (2.5 MG/3ML) 0.083% IN NEBU: 2.5000 mg | INHALATION_SOLUTION | RESPIRATORY_TRACT | Status: AC | PRN

## 2015-08-09 MED ORDER — INSULIN ASPART 100 UNIT/ML ~~LOC~~ SOLN: SUBCUTANEOUS | Status: AC

## 2015-08-09 MED ORDER — METHYLPREDNISOLONE SODIUM SUCC 40 MG IJ SOLR: 40.0000 mg | Freq: Every day | INTRAMUSCULAR | Status: AC

## 2015-08-09 MED ORDER — BISACODYL 10 MG RE SUPP: 10.0000 mg | Freq: Every day | RECTAL | Status: AC | PRN

## 2015-08-09 MED ORDER — BUSPIRONE HCL 10 MG PO TABS: 10.0000 mg | ORAL_TABLET | Freq: Three times a day (TID) | ORAL | Status: AC

## 2015-08-09 MED ORDER — SODIUM CHLORIDE 0.9 % IV SOLN: 10.0000 mL | INTRAVENOUS | Status: AC

## 2015-08-09 MED ORDER — CETYLPYRIDINIUM CHLORIDE 0.05 % MT LIQD: 7.0000 mL | Freq: Two times a day (BID) | OROMUCOSAL | Status: AC

## 2015-08-09 MED ORDER — ASPIRIN 325 MG PO TABS: 325.0000 mg | ORAL_TABLET | Freq: Every day | ORAL | Status: AC

## 2015-08-09 MED ORDER — DIAZEPAM 2 MG PO TABS: 2.0000 mg | ORAL_TABLET | Freq: Three times a day (TID) | ORAL | Status: AC

## 2015-08-09 MED ORDER — BISACODYL 10 MG RE SUPP: 10.0000 mg | Freq: Every day | RECTAL | Status: DC | PRN

## 2015-08-09 MED ORDER — DILTIAZEM HCL 120 MG PO TABS: 120.0000 mg | ORAL_TABLET | Freq: Three times a day (TID) | ORAL | Status: AC

## 2015-08-09 MED ORDER — IPRATROPIUM-ALBUTEROL 0.5-2.5 (3) MG/3ML IN SOLN: 3.0000 mL | Freq: Four times a day (QID) | RESPIRATORY_TRACT | Status: AC

## 2015-08-09 NOTE — Progress Notes (Signed)
Report called to [facility], RN and all questions answered.

## 2015-08-09 NOTE — Progress Notes (Signed)
Spoke with pt's daughter Merryl Hacker [phone], who agreed with pt going to [facility] and pt also agreed.

## 2015-08-09 NOTE — Discharge Summary (Signed)
Physician Discharge Summary  Patient ID: Kelsey Garcia MRN: [id number] DOB/AGE: 1945-03-27 70 y.o.  Admit date: 07/30/2015 Discharge date: 08/09/2015    Discharge Diagnoses:  Severe COPD  Acute on Chronic Hypoxic / Hypercapnic Respiratory Failure  PNA / CAP Stage IIB Non-Small Cell Lung Cancer s/p XRT (followed by Dr. Julien Nordmann CAD Chronic Diastolic CHF  HTN GERD Aspiration Risk / Dysphagia  Anemia of Critical Illness + Chronic Disease Thrombocytopenia  Steroid Induced Hyperglycemia  Acute Metabolic Encephalopathy  Anxiety                                                                        DISCHARGE PLAN BY DIAGNOSIS     Severe COPD on chronic prednisone Acute on chronic hypoxic/hypercapnic respiratory failure 2nd to AECOPD, pneumonia Stage IIB non-small cell lung cancer - s/p XRT (followed by Dr. Julien Nordmann)  Discharge Plan:  Wean O2 for saturations 88-95%, baseline 2L continuous Continue scheduled bronchodilators > duonebs Q6 with PRN albuterol  Continue solumedrol, 40 mg QD Wean steroids to baseline 10 mg QD Hold home symbicort, spiriva for now.  Ok to resume as she improves Will need follow up with [name] & Dr. Earlie Server post discharge   Hx CAD, chronic diastolic CHF, HTN.  Discharge Plan:  Continue ASA, cardizem, lopressor Start home dose of lasix 40 gm qd Monitor BMET and UOP Replace electrolytes as needed Limited Code Blue >> OK for reintubation. See notes below.    GERD. Aspiration risk  Discharge Plan:  Protonix Dyphagia 3 diet, thin liquid diet Continue SLP efforts  Aspiration precautions   Anemia of critical illness and chronic disease. Stage IIB non-small cell lung cancer - s/p XRT (followed by Dr. Julien Nordmann). Thrombocytopenia  Discharge Plan: SCD's / Heparin F/u CBC    CAP - resolving   Discharge Plan: Finished levaquin. Last Pct negative    Steroid induced hyperglycemia.  Discharge Plan:  SSI   Acute metabolic  encephalopathy. Hx anxiety  Discharge Plan: Standing valium as per palliative care Continue home buspirone. Push PT efforts                     DISCHARGE SUMMARY   Kelsey Garcia is a 69 y.o. y/o female with a PMH of remote opiate abuse (1970's), COPD (FEV1 of 49% documented in 2014)  on chronic 2 - 3 L home O2 and 103m prednisone (followed by Dr. AElsworth Soho and stage II B NSCLC s/p XRT (followed by Dr. MEarlie Server. She had recent admission 07/15/15 through 07/18/15 for CAP and AECOPD.   On 6/20 her daughter daughter noted her to have confusion, shortness of breath, cyanosis off O2 and dyspnea.  EMS was activated and treated the patient with 125 mg IV solumedrol and oxygen.  [facility] secondary to an acute exacerbation of COPD vs early HCAP.  The patient was initially treated with BiPAP for increased work of breathing.  She was previously listed as a DNR.  However, the patient apparently agreed to intubation if she failed bipap.  She subsequently declined and was intubated for respiratory failure.   She was admitted by PCCM to the ICU for  further care.  She was treated with empiric antibiotics for HCAP.  Pan cultures to date are negative.  The patient was treated with IV steroids, diuresis and nebulized bronchodilators.  She completed 7 days of antibiotics while inpatient for HCAP.  She remained on mechanical ventilation until 6/26 at which time she was extubated. ICU course complicated by mild delirium.  Procalcitonin negative as of 6/30.  Post extubation, she was evaluated by Speech Language and ultimately cleared for dysphagia 3 diet with thin liquids.  Palliative Care was consulted for goals of care.  Family / patient wishes as below.  The patient was medically cleared for discharge 6/30 to [facility] in [place] for further rehabilitation efforts.     PALLIATIVE CARE NOTES - from 6/28  1. Kelsey Garcia is not "hospice" ready. 2. The family strongly feels she needs physical therapy rehab and they request SNF at D/C as the daughter is unable to care for her mother at home. 3. They are concerned that she receive the appropriate pain management medications and medications for her panic attacks at the time of discharge from the hospital. 4. We reviewed advanced directives. The family agreed to complete these and let me know when they are finished. 5. Family prefers patient remain partial code - and allow for re-intubation if needed.   The family feels that Kelsey Garcia has a good quality of life if she can recognize them and have a laugh with them. They are willing to accept artificial feeding if she still had that level of function. They would not want her life prolonged if she could not recognize them or communicate.   We discussed her chronic conditions (COPD, CHF, Afib) that would not improve and her new issue of aspiration. Family is aware that recurrent aspiration pneumonia is a terminal condition.    STUDIES:  03/05/15 PFT >> FEV1 0.74 (31%), FEV1% 40, TLC 3.68 (71%), DLCO 19%  CULTURES: Blood 06/20 >> negative  Urine 6/20 >> multiple species  ANTIBIOTICS: Vanc 06/20 > 6/22 Cefepime 06/20 > 6/22 Levaquin 6/22 > 6/29  SIGNIFICANT EVENTS: 06/20  Admitted with AECOPD   LINES/TUBES: ETT 06/20 > 6/26   Discharge Exam: General: chronically ill appearing female in NAD Neuro: No focal deficits HEENT: Moist mucus membranes, no thyromegaly, JVD Cardiac: RRR, No MRG Chest: Diminished air entry, scattered B/L wheeze Abd: Soft, non tender, + BS Ext: No edema Skin: No rashes  Filed Vitals:   08/09/15 0425 08/09/15 0800 08/09/15 0914 08/09/15 1029  BP:    140/95  Pulse:   95 97  Temp: 97.9 F (36.6 C) 98.1 F (36.7 C)  97.7 F (36.5 C)  TempSrc: Oral Oral  Oral  Resp:   14 18  Height:    5' 5"  (1.651 m)  Weight: 185 lb 6.5 oz (84.1 kg)   183 lb 6.8 oz (83.2 kg)   SpO2:   94% 94%     Discharge Labs  BMET  Recent Labs Lab 08/05/15 0327 08/06/15 0320 08/07/15 0308 08/08/15 0319 08/09/15 0521  NA 136 136 137 138 139  K 4.8 5.4* 4.8 4.7 4.6  CL 101 101 100* 98* 101  CO2 30 31 31  34* 35*  GLUCOSE 191* 147* 127* 151* 113*  BUN 39* 41* 30* 24* 22*  CREATININE 0.51 0.55 0.44 0.49 0.47  CALCIUM 8.3* 8.5* 8.4* 8.7* 8.6*    CBC  Recent Labs Lab 08/07/15 0308 08/08/15 0319 08/09/15 0521  HGB 10.5* 10.2* 10.0*  HCT  34.9* 34.1* 33.0*  WBC 9.5 8.9 9.3  PLT 125* 131* 127*     Discharge Instructions    Diet - low sodium heart healthy    Complete by:  As directed   Dysphagia 3, thin liquids     Discharge instructions    Complete by:  As directed   Continue current therapies for transport to [facility].     Increase activity slowly    Complete by:  As directed             Follow-up Information    Follow up with Darden Amber, PA.   Why:  Post discharge from [place] information:   [place] [address] [phone]       Follow up with Rigoberto Noel., MD.   Specialty:  Pulmonary Disease   Why:  Post discharge from [facility].  Call for an appointment.   Contact information:   [address] [address] [phone]          Medication List    STOP taking these medications        aspirin 325 MG EC tablet  Replaced by:  aspirin 325 MG tablet     budesonide-formoterol 160-4.5 MCG/ACT inhaler  Commonly known as:  SYMBICORT     HYDROcodone-acetaminophen 10-325 MG tablet  Commonly known as:  NORCO     HYDROcodone-acetaminophen 5-325 MG tablet  Commonly known as:  NORCO/VICODIN     HYDROcodone-homatropine 5-1.5 MG/5ML syrup  Commonly known as:  HYCODAN     metoprolol succinate 25 MG 24 hr tablet  Commonly known as:  TOPROL-XL     predniSONE 10 MG (21) Tbpk tablet  Commonly known as:  STERAPRED UNI-PAK 21 TAB     predniSONE  10 MG tablet  Commonly known as:  DELTASONE     SPIRIVA HANDIHALER 18 MCG inhalation capsule  Generic drug:  tiotropium     VENTOLIN HFA 108 (90 Base) MCG/ACT inhaler  Generic drug:  albuterol  Replaced by:  albuterol (2.5 MG/3ML) 0.083% nebulizer solution      TAKE these medications        albuterol (2.5 MG/3ML) 0.083% nebulizer solution  Commonly known as:  PROVENTIL  Take 3 mLs (2.5 mg total) by nebulization every 3 (three) hours as needed for wheezing or shortness of breath.     antiseptic oral rinse 0.05 % Liqd solution  Commonly known as:  CPC / CETYLPYRIDINIUM CHLORIDE 0.05%  7 mLs by Mouth Rinse route 2 (two) times daily.     aspirin 325 MG tablet  Place 1 tablet (325 mg total) into feeding tube daily.     bisacodyl 10 MG suppository  Commonly known as:  DULCOLAX  Place 1 suppository (10 mg total) rectally daily as needed for moderate constipation.     busPIRone 10 MG tablet  Commonly known as:  BUSPAR  Place 1 tablet (10 mg total) into feeding tube 3 (three) times daily.     diazepam 2 MG tablet  Commonly known as:  VALIUM  Take 1 tablet (2 mg total) by mouth every 8 (eight) hours.     diltiazem 120 MG tablet  Commonly known as:  CARDIZEM  Take 1 tablet (120 mg total) by mouth every 8 (eight) hours.     FLUoxetine 20 MG capsule  Commonly known as:  PROZAC  Take 1 capsule (20 mg total) by mouth daily.     food  thickener Powd  Commonly known as:  THICK IT  Take 1 Container by mouth as needed (use to make honey consistency).     furosemide 40 MG tablet  Commonly known as:  LASIX  Take 40 mg by mouth daily.     heparin 5000 UNIT/ML injection  Inject 1 mL (5,000 Units total) into the skin every 8 (eight) hours.     insulin aspart 100 UNIT/ML injection  Commonly known as:  novoLOG  CBG < 70: implement hypoglycemia protocol CBG 70 - 120: 0 units CBG 121 - 150: 2 units CBG 151 - 200: 3 units CBG 201 - 250: 5 units CBG 251 - 300: 8 units CBG 301 - 350: 11  units CBG 351 - 400: 15 units CBG > 400: call MD and obtain STAT lab verification     ipratropium-albuterol 0.5-2.5 (3) MG/3ML Soln  Commonly known as:  DUONEB  Take 3 mLs by nebulization every 6 (six) hours.     KLOR-CON M20 20 MEQ tablet  Generic drug:  potassium chloride SA  Take 20 mEq by mouth daily.     methylPREDNISolone sodium succinate 40 mg/mL injection  Commonly known as:  SOLU-MEDROL  Inject 1 mL (40 mg total) into the vein daily.     metoprolol tartrate 25 MG tablet  Commonly known as:  LOPRESSOR  Take 0.5 tablets (12.5 mg total) by mouth 2 (two) times daily.     OXYGEN  Inhale 2 L into the lungs continuous. 2-3L/min     pantoprazole 40 MG tablet  Commonly known as:  PROTONIX  Take 1 tablet (40 mg total) by mouth daily.     sodium chloride 0.9 % infusion  Inject 10 mLs into the vein continuous.          Disposition:  [place]   Discharged Condition: Kelsey Garcia has met maximum benefit of inpatient care and is medically stable and cleared for discharge.  Patient is pending follow up as above.      Time spent on disposition:  Greater than 35 minutes.   Signed: Noe Gens, NP-C [facility] Pgr: [phone] Office: [phone]

## 2015-08-09 NOTE — Progress Notes (Signed)
CSW noted that pt will be going to [facility]. No needs from CSW at this time.   Kingsley Spittle, [place] Clinical Social Worker [phone]

## 2015-08-09 NOTE — Progress Notes (Signed)
Report called to Sonia Baller, RN at [facility].  All questions answered.  [facility] transport to transfer patient to [facility] hospital.  D/C summary faxed to [phone].

## 2015-08-09 NOTE — Progress Notes (Signed)
Date: August 09 2015/Eliette Drumwright Rosana Hoes RN, BSN, [place]   [phone]/spoke with DR. Mannam concerning [facility]/okayed transfer/tct-karen Marcello [place] and made aware.

## 2015-08-09 NOTE — NC FL2 (Signed)
[place] LEVEL OF CARE SCREENING TOOL     IDENTIFICATION  Patient Name: Kelsey Garcia Birthdate: 1946-02-08 Sex: female Admission Date (Current Location): 07/30/2015  [facility] and [place] Number:  Herbalist and Address:  [facility],  [place] [address] [place]      Provider Number: [id number]  Attending Physician Name and Address:  Rigoberto Noel, MD  Relative Name and Phone Number:       Current Level of Care: Hospital Recommended Level of Care: [place] Prior Approval Number:    Date Approved/Denied:   PASRR Number:    Discharge Plan: SNF    Current Diagnoses: Patient Active Problem List   Diagnosis Date Noted  . Slow transit constipation   . Aspiration into airway   . Acute hypoxemic respiratory failure ([place])   . Goals of care, counseling/discussion   . Acute delirium   . Acute respiratory failure with hypercapnia ([place])   . Hypocalcemia   . Acute encephalopathy   . AKI (acute kidney injury) ([place])   . Chronic respiratory failure ([place]) 05/22/2015  . Acute exacerbation of chronic obstructive pulmonary disease (COPD) ([place]) 04/04/2015  . Chronic fatigue   . Cancer of lingula of lung ([place]) 03/04/2015  . Palliative care encounter   . Pain in joint, pelvic region and thigh   . Anxiety state   . S/P bronchoscopy with biopsy   . Chronic obstructive pulmonary disease with acute exacerbation ([place])   . Chronic diastolic (congestive) heart failure ([place]) 02/23/2015  . Atrial fibrillation with RVR ([place]) 01/30/2015  . Thoracic vertebral fracture ([place]) 11/03/2014  . Acute on chronic diastolic heart failure ([place])   . Major depressive disorder, recurrent, in full remission with anxious distress ([place]) 07/23/2014  . Narcotic addiction ([place]) 07/12/2014  . Narcotic dependence ([place]) 07/12/2014  . Generalized abdominal pain 07/05/2014  . Chronic pain syndrome 07/02/2014  . COPD exacerbation ([place]) 06/19/2014  . Atrial  fibrillation ([place]) 06/19/2014  . Pancytopenia ([place]) 05/15/2014  . Fracture of rib of left side with delayed healing 05/15/2014  . Respiratory failure, chronic ([place]) 12/20/2013  . Neck pain on right side 12/18/2013  . Chronic diastolic CHF (congestive heart failure) ([place]) 10/12/2013  . Osteoporosis 11/29/2012  . Easy bruising 10/01/2012  . Oral thrush 09/24/2012  . Other screening mammogram 12/25/2011  . Screening for colon cancer 12/25/2011  . Impaired mobility 11/26/2011  . 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 Long term current use of opiate analgesic 10/30/2011  . GERD (gastroesophageal reflux disease) 10/30/2011  . Chronic cough 08/28/2011  . Elevated MCV 08/28/2011  . Financial difficulties 04/22/2011  . Preventative health care 10/30/2010  . Anxiety 10/30/2010  . HLD (hyperlipidemia) 05/24/2009  . NICOTINE ADDICTION 01/07/2009  . Osteoarthritis 11/28/2008  . COPD (chronic obstructive pulmonary disease) ([place]) 03/28/2008  . OBSESSIVE-COMPULSIVE DISORDER 12/29/2005  . Depression with anxiety 12/29/2005  . Essential hypertension 12/29/2005  . CARDIOMYOPATHY 12/29/2005  . Atrial fibrillation with rapid ventricular response ([place]) 12/29/2005  . CHOLELITHIASIS 12/29/2005  . INSOMNIA 12/29/2005    Orientation RESPIRATION BLADDER Height & Weight     Self, Time, Situation, Place  O2 (5L) Incontinent Weight: 183 lb 6.8 oz (83.2 kg) Height:  '5\' 5"'$  (165.1 cm)  BEHAVIORAL SYMPTOMS/MOOD NEUROLOGICAL BOWEL NUTRITION STATUS      Continent Diet (Dys 3)  AMBULATORY STATUS COMMUNICATION OF NEEDS Skin   Limited Assist Verbally Bruising  Personal Care Assistance Level of Assistance  Bathing, Feeding, Dressing Bathing Assistance: Limited assistance Feeding assistance: Limited assistance Dressing Assistance: Limited assistance     Functional Limitations Info  Sight, Hearing, Speech Sight Info: Adequate Hearing Info: Adequate Speech Info: Adequate    SPECIAL  CARE FACTORS FREQUENCY  PT (By licensed PT), Speech therapy                    Contractures Contractures Info: Not present    Additional Factors Info  Code Status, Allergies, Psychotropic, Isolation Precautions Code Status Info: Partial, Limited Resuscitation Allergies Info: Ace Inhibitors, Codeine, Pseudoeph-doxylamine-dm-apap, Diphenhydramine HCL, Erythromycin, Nsaids, Tramadol HCL, Fosamax(Alentronate), Pseudophedrine, Nyquil, Tomato, Orange fruit (citris) Psychotropic Info: Valium, Prozac, Buspar   Isolation Precautions Info: MRSA     Current Medications (08/09/2015):  This is the current hospital active medication list Current Facility-Administered Medications  Medication Dose Route Frequency Provider Last Rate Last Dose  . 0.9 %  sodium chloride infusion   Intravenous Continuous Juanito Doom, MD 10 mL/hr at 08/07/15 1103    . albuterol (PROVENTIL) (2.5 MG/3ML) 0.083% nebulizer solution 2.5 mg  2.5 mg Nebulization Q3H PRN Rahul P Desai, PA-C   2.5 mg at 08/07/15 1217  . antiseptic oral rinse (CPC / CETYLPYRIDINIUM CHLORIDE 0.05%) solution 7 mL  7 mL Mouth Rinse BID Rigoberto Noel, MD   7 mL at 08/09/15 1000  . aspirin tablet 325 mg  325 mg Per Tube Daily Rahul P Desai, PA-C   325 mg at 08/09/15 0914  . bisacodyl (DULCOLAX) suppository 10 mg  10 mg Rectal Daily PRN Melton Alar, PA-C      . busPIRone (BUSPAR) tablet 10 mg  10 mg Per Tube TID Erick Colace, NP   10 mg at 08/09/15 0914  . diazepam (VALIUM) tablet 2 mg  2 mg Oral Q8H Marianne L [place], PA-C   2 mg at 08/09/15 1248  . diltiazem (CARDIZEM) tablet 120 mg  120 mg Oral Q8H Rigoberto Noel, MD   120 mg at 08/09/15 0536  . FLUoxetine (PROZAC) capsule 20 mg  20 mg Oral Daily Rigoberto Noel, MD   20 mg at 08/09/15 0914  . food thickener (THICK IT) powder   Oral PRN Grace Bushy Minor, NP      . heparin injection 5,000 Units  5,000 Units Subcutaneous Q8H Rahul P Desai, PA-C   5,000 Units at 08/09/15 0536  . insulin aspart  (novoLOG) injection 0-15 Units  0-15 Units Subcutaneous TID AC & HS Marshell Garfinkel, MD   2 Units at 08/09/15 0915  . ipratropium-albuterol (DUONEB) 0.5-2.5 (3) MG/3ML nebulizer solution 3 mL  3 mL Nebulization Q6H Rahul P Desai, PA-C   3 mL at 08/09/15 0914  . methylPREDNISolone sodium succinate (SOLU-MEDROL) 40 mg/mL injection 40 mg  40 mg Intravenous Daily Grace Bushy Minor, NP   40 mg at 08/09/15 0914  . metoprolol tartrate (LOPRESSOR) tablet 12.5 mg  12.5 mg Oral BID Rigoberto Noel, MD   12.5 mg at 08/09/15 0914  . pantoprazole (PROTONIX) EC tablet 40 mg  40 mg Oral Q24H Rigoberto Noel, MD   40 mg at 08/09/15 0973     Discharge Medications: Please see discharge summary for a list of discharge medications.  Relevant Imaging Results:  Relevant Lab Results:   Additional Information [id number]  Weston Anna, LCSW    Pl send to rounding MD

## 2015-08-09 NOTE — Progress Notes (Signed)
PULMONARY / CRITICAL CARE MEDICINE   Name: Kelsey Garcia MRN: [id number] DOB: 1945/11/17    ADMISSION DATE:  07/30/2015  CHIEF COMPLAINT:  AMS  BRIEF:   Kelsey Garcia is a 70 y.o. female with PMH as outlined below including COPD on chronic 2 - 3 L home O2 and '10mg'$  prednisone (followed by [name]) and stage II B NSCLC s/p XRT (followed by Dr. Earlie Server).  She had recent admission 07/15/15 through 07/18/15 for CAP and AECOPD.  Re-admitted w/ working dx on 6/20 w/ Acute on Chronic resp failure in setting of AECOPD vs early HCAP  SUBJECTIVE:  No issues overnight.  VITAL SIGNS: BP 128/72 mmHg  Pulse 106  Temp(Src) 97.9 F (36.6 C) (Oral)  Resp 14  Ht '5\' 5"'$  (1.651 m)  Wt 185 lb 6.5 oz (84.1 kg)  BMI 30.85 kg/m2  SpO2 95%  HEMODYNAMICS:    VENTILATOR SETTINGS:    INTAKE / OUTPUT: I/O last 3 completed shifts: In: 240 [P.O.:240] Out: 1310 [Urine:1310]  PHYSICAL EXAMINATION: General: Asleep but arousable Neuro: No focal deficits HEENT: Moist mucus membranes, no thyromegaly, JVD Cardiac: RRR, No MRG Chest: Diminished air entry, scattered B/L wheeze Abd: Soft, non tender, + BS Ext: No edema Skin: No rashes  LABS:  BMET  Recent Labs Lab 08/07/15 0308 08/08/15 0319 08/09/15 0521  NA 137 138 139  K 4.8 4.7 4.6  CL 100* 98* 101  CO2 31 34* 35*  BUN 30* 24* 22*  CREATININE 0.44 0.49 0.47  GLUCOSE 127* 151* 113*    Electrolytes  Recent Labs Lab 08/07/15 0308 08/08/15 0319 08/09/15 0521  CALCIUM 8.4* 8.7* 8.6*    CBC  Recent Labs Lab 08/07/15 0308 08/08/15 0319 08/09/15 0521  WBC 9.5 8.9 9.3  HGB 10.5* 10.2* 10.0*  HCT 34.9* 34.1* 33.0*  PLT 125* 131* 127*    Coag's No results for input(s): APTT, INR in the last 168 hours.  Sepsis Markers  Recent Labs Lab 08/07/15 0308 08/09/15 0521  PROCALCITON <0.10 <0.10    ABG  Recent Labs Lab 08/03/15 1352  PHART 7.357  PCO2ART 56.3*  PO2ART 64.6*    Liver Enzymes No results for input(s):  AST, ALT, ALKPHOS, BILITOT, ALBUMIN in the last 168 hours.  Cardiac Enzymes No results for input(s): TROPONINI, PROBNP in the last 168 hours.  Glucose  Recent Labs Lab 08/07/15 2321 08/08/15 0453 08/08/15 0742 08/08/15 1201 08/08/15 1537 08/08/15 2207  GLUCAP 143* 141* 163* 141* 145* 151*    Imaging No results found.  STUDIES:  03/05/15 PFT >> FEV1 0.74 (31%), FEV1% 40, TLC 3.68 (71%), DLCO 19%  CULTURES: Blood 06/20 >NG Urine 6/20 > multiple species  ANTIBIOTICS: Vanc 06/20 > 6/22 Cefepime 06/20 > 6/22 Levaquin 6/22 > 6/29  SIGNIFICANT EVENTS: 06/20 > Admitted with AECOPD   LINES/TUBES: ETT 06/20 > 6/26  DISCUSSION: 70 y/o with severe steroid dependent COPD, recurrent exacerbations admitted with PNA, AECOPD H/o NSCLC -presumed cured by SBRT.   ASSESSMENT / PLAN:  PULMONARY A: Severe COPD on chronic prednisone Acute on chronic hypoxic/hypercapnic respiratory failure 2nd to AECOPD, pneumonia P:   Wean down Fio2 as tolerated. Scheduled BDs Continue solumedrol at current dose.  CARDIOVASCULAR A:  Hx CAD, chronic diastolic CHF, HTN. P:  Continue ASA, cardizem, lopressor  RENAL A:   No acute issues. P:   Start home dose of lasix 40 gm qd Monitor BMET and UOP Replace electrolytes as needed  GASTROINTESTINAL A:   GERD. Aspiration risk P:  Protonix Dyphagia 3 diet  HEMATOLOGIC / ONCOLOGIC A:   Anemia of critical illness and chronic disease. Stage IIB non-small cell lung cancer - s/p XRT (followed by Dr. Julien Nordmann). Thrombocytopenia P:  SCD's / Heparin F/u CBC  INFECTIOUS A:   CAP P:  Finished levaquin. Last Pct negative  ENDOCRINE A:   Steroid induced hyperglycemia. P:   SSI  NEUROLOGIC A:   Acute metabolic encephalopathy. Hx anxiety P:   Standing valium as per palliative care Continue out-pt buspirone. Will need PT eval, order placed  Stable for transfer to telemetry  Marshell Garfinkel MD [place] [facility] Pager [phone] If no answer or after 3pm call: [phone] 08/09/2015, 8:23 AM

## 2015-08-10 LAB — CBC WITH DIFFERENTIAL/PLATELET
BASOS PCT: 0 %
Basophils Absolute: 0 10*3/uL (ref 0.0–0.1)
EOS PCT: 0 %
Eosinophils Absolute: 0 10*3/uL (ref 0.0–0.7)
HCT: 34.8 % — ABNORMAL LOW (ref 36.0–46.0)
Hemoglobin: 10.2 g/dL — ABNORMAL LOW (ref 12.0–15.0)
LYMPHS ABS: 0.7 10*3/uL (ref 0.7–4.0)
Lymphocytes Relative: 7 %
MCH: 26.2 pg (ref 26.0–34.0)
MCHC: 29.3 g/dL — ABNORMAL LOW (ref 30.0–36.0)
MCV: 89.2 fL (ref 78.0–100.0)
MONO ABS: 0.5 10*3/uL (ref 0.1–1.0)
Monocytes Relative: 5 %
NEUTROS PCT: 88 %
Neutro Abs: 9.2 10*3/uL — ABNORMAL HIGH (ref 1.7–7.7)
PLATELETS: 118 10*3/uL — AB (ref 150–400)
RBC: 3.9 MIL/uL (ref 3.87–5.11)
RDW: 20.1 % — ABNORMAL HIGH (ref 11.5–15.5)
WBC: 10.4 10*3/uL (ref 4.0–10.5)

## 2015-08-10 LAB — HEMOGLOBIN A1C
Hgb A1c MFr Bld: 5.9 % — ABNORMAL HIGH (ref 4.8–5.6)
MEAN PLASMA GLUCOSE: 123 mg/dL

## 2015-08-10 LAB — MAGNESIUM: Magnesium: 2.3 mg/dL (ref 1.7–2.4)

## 2015-08-10 LAB — PROTIME-INR
INR: 1.04 (ref 0.00–1.49)
Prothrombin Time: 13.8 seconds (ref 11.6–15.2)

## 2015-08-10 LAB — PHOSPHORUS: Phosphorus: 3.6 mg/dL (ref 2.5–4.6)

## 2015-08-11 LAB — CBC WITH DIFFERENTIAL/PLATELET
BASOS ABS: 0 10*3/uL (ref 0.0–0.1)
Basophils Relative: 0 %
EOS ABS: 0 10*3/uL (ref 0.0–0.7)
Eosinophils Relative: 0 %
HCT: 34.8 % — ABNORMAL LOW (ref 36.0–46.0)
HEMOGLOBIN: 10.1 g/dL — AB (ref 12.0–15.0)
LYMPHS ABS: 0.4 10*3/uL — AB (ref 0.7–4.0)
LYMPHS PCT: 7 %
MCH: 25.6 pg — AB (ref 26.0–34.0)
MCHC: 29 g/dL — ABNORMAL LOW (ref 30.0–36.0)
MCV: 88.3 fL (ref 78.0–100.0)
Monocytes Absolute: 0.2 10*3/uL (ref 0.1–1.0)
Monocytes Relative: 4 %
NEUTROS PCT: 90 %
Neutro Abs: 5.4 10*3/uL (ref 1.7–7.7)
Platelets: 113 10*3/uL — ABNORMAL LOW (ref 150–400)
RBC: 3.94 MIL/uL (ref 3.87–5.11)
RDW: 19.7 % — ABNORMAL HIGH (ref 11.5–15.5)
WBC: 6 10*3/uL (ref 4.0–10.5)

## 2015-08-11 LAB — BASIC METABOLIC PANEL
ANION GAP: 10 (ref 5–15)
BUN: 14 mg/dL (ref 6–20)
CHLORIDE: 94 mmol/L — AB (ref 101–111)
CO2: 36 mmol/L — ABNORMAL HIGH (ref 22–32)
Calcium: 8.9 mg/dL (ref 8.9–10.3)
Creatinine, Ser: 0.31 mg/dL — ABNORMAL LOW (ref 0.44–1.00)
GFR calc Af Amer: >60 mL/min (ref >60)
Glucose, Bld: 144 mg/dL — ABNORMAL HIGH (ref 65–99)
POTASSIUM: 4.1 mmol/L (ref 3.5–5.1)
SODIUM: 140 mmol/L (ref 135–145)

## 2015-08-11 LAB — PHOSPHORUS: Phosphorus: 3.7 mg/dL (ref 2.5–4.6)

## 2015-08-11 LAB — BRAIN NATRIURETIC PEPTIDE: B NATRIURETIC PEPTIDE 5: 139.1 pg/mL — AB (ref 0.0–100.0)

## 2015-08-11 LAB — MAGNESIUM: MAGNESIUM: 2 mg/dL (ref 1.7–2.4)

## 2015-08-13 LAB — CBC WITH DIFFERENTIAL/PLATELET
BASOS ABS: 0 10*3/uL (ref 0.0–0.1)
BASOS PCT: 0 %
EOS PCT: 2 %
Eosinophils Absolute: 0.1 10*3/uL (ref 0.0–0.7)
HCT: 33.1 % — ABNORMAL LOW (ref 36.0–46.0)
Hemoglobin: 9.3 g/dL — ABNORMAL LOW (ref 12.0–15.0)
Lymphocytes Relative: 6 %
Lymphs Abs: 0.4 10*3/uL — ABNORMAL LOW (ref 0.7–4.0)
MCH: 25.5 pg — ABNORMAL LOW (ref 26.0–34.0)
MCHC: 28.1 g/dL — ABNORMAL LOW (ref 30.0–36.0)
MCV: 90.7 fL (ref 78.0–100.0)
MONO ABS: 0.4 10*3/uL (ref 0.1–1.0)
Monocytes Relative: 6 %
Neutro Abs: 5.9 10*3/uL (ref 1.7–7.7)
Neutrophils Relative %: 85 %
PLATELETS: 111 10*3/uL — AB (ref 150–400)
RBC: 3.65 MIL/uL — AB (ref 3.87–5.11)
RDW: 20.3 % — AB (ref 11.5–15.5)
WBC: 6.9 10*3/uL (ref 4.0–10.5)

## 2015-08-13 LAB — BASIC METABOLIC PANEL
ANION GAP: 7 (ref 5–15)
BUN: 17 mg/dL (ref 6–20)
CALCIUM: 8.5 mg/dL — AB (ref 8.9–10.3)
CO2: 38 mmol/L — ABNORMAL HIGH (ref 22–32)
Chloride: 96 mmol/L — ABNORMAL LOW (ref 101–111)
Creatinine, Ser: 0.43 mg/dL — ABNORMAL LOW (ref 0.44–1.00)
GFR calc Af Amer: >60 mL/min (ref >60)
GLUCOSE: 99 mg/dL (ref 65–99)
POTASSIUM: 3.7 mmol/L (ref 3.5–5.1)
Sodium: 141 mmol/L (ref 135–145)

## 2015-08-13 LAB — MAGNESIUM: MAGNESIUM: 2 mg/dL (ref 1.7–2.4)

## 2015-08-13 LAB — PHOSPHORUS: Phosphorus: 3.5 mg/dL (ref 2.5–4.6)

## 2015-08-15 LAB — CBC
HEMATOCRIT: 30.7 % — AB (ref 36.0–46.0)
Hemoglobin: 8.8 g/dL — ABNORMAL LOW (ref 12.0–15.0)
MCH: 26 pg (ref 26.0–34.0)
MCHC: 28.7 g/dL — AB (ref 30.0–36.0)
MCV: 90.8 fL (ref 78.0–100.0)
Platelets: 130 10*3/uL — ABNORMAL LOW (ref 150–400)
RBC: 3.38 MIL/uL — ABNORMAL LOW (ref 3.87–5.11)
RDW: 20.2 % — AB (ref 11.5–15.5)
WBC: 7.7 10*3/uL (ref 4.0–10.5)

## 2015-08-15 LAB — BASIC METABOLIC PANEL
Anion gap: 4 — ABNORMAL LOW (ref 5–15)
BUN: 16 mg/dL (ref 6–20)
CHLORIDE: 98 mmol/L — AB (ref 101–111)
CO2: 38 mmol/L — AB (ref 22–32)
Calcium: 8.5 mg/dL — ABNORMAL LOW (ref 8.9–10.3)
Creatinine, Ser: 0.48 mg/dL (ref 0.44–1.00)
GFR calc Af Amer: >60 mL/min (ref >60)
GFR calc non Af Amer: >60 mL/min (ref >60)
GLUCOSE: 116 mg/dL — AB (ref 65–99)
POTASSIUM: 3.5 mmol/L (ref 3.5–5.1)
Sodium: 140 mmol/L (ref 135–145)

## 2015-08-16 LAB — BLOOD GAS, ARTERIAL
ACID-BASE EXCESS: 14.4 mmol/L — AB (ref 0.0–2.0)
BICARBONATE: 39.2 meq/L — AB (ref 20.0–24.0)
DELIVERY SYSTEMS: POSITIVE
Expiratory PAP: 8
FIO2: 0.4
Inspiratory PAP: 18
O2 Saturation: 96.2 %
PATIENT TEMPERATURE: 98.6
PCO2 ART: 55.2 mmHg — AB (ref 35.0–45.0)
PO2 ART: 78.8 mmHg — AB (ref 80.0–100.0)
TCO2: 40.9 mmol/L (ref 0–100)
pH, Arterial: 7.465 — ABNORMAL HIGH (ref 7.350–7.450)

## 2015-08-17 ENCOUNTER — Other Ambulatory Visit (HOSPITAL_COMMUNITY): Payer: Medicare Other

## 2015-08-17 LAB — CBC
HCT: 32.1 % — ABNORMAL LOW (ref 36.0–46.0)
HEMOGLOBIN: 9.3 g/dL — AB (ref 12.0–15.0)
MCH: 26.1 pg (ref 26.0–34.0)
MCHC: 29 g/dL — ABNORMAL LOW (ref 30.0–36.0)
MCV: 89.9 fL (ref 78.0–100.0)
Platelets: 161 10*3/uL (ref 150–400)
RBC: 3.57 MIL/uL — AB (ref 3.87–5.11)
RDW: 20.5 % — AB (ref 11.5–15.5)
WBC: 11.3 10*3/uL — AB (ref 4.0–10.5)

## 2015-08-17 LAB — BASIC METABOLIC PANEL
ANION GAP: 8 (ref 5–15)
BUN: 26 mg/dL — ABNORMAL HIGH (ref 6–20)
CHLORIDE: 97 mmol/L — AB (ref 101–111)
CO2: 36 mmol/L — ABNORMAL HIGH (ref 22–32)
CREATININE: 0.83 mg/dL (ref 0.44–1.00)
Calcium: 9 mg/dL (ref 8.9–10.3)
GFR calc non Af Amer: >60 mL/min (ref >60)
Glucose, Bld: 125 mg/dL — ABNORMAL HIGH (ref 65–99)
Potassium: 3.4 mmol/L — ABNORMAL LOW (ref 3.5–5.1)
SODIUM: 141 mmol/L (ref 135–145)

## 2015-08-19 ENCOUNTER — Other Ambulatory Visit (HOSPITAL_COMMUNITY): Payer: Medicare Other

## 2015-08-19 LAB — BASIC METABOLIC PANEL
ANION GAP: 9 (ref 5–15)
BUN: 41 mg/dL — ABNORMAL HIGH (ref 6–20)
CHLORIDE: 98 mmol/L — AB (ref 101–111)
CO2: 35 mmol/L — ABNORMAL HIGH (ref 22–32)
Calcium: 9 mg/dL (ref 8.9–10.3)
Creatinine, Ser: 1.04 mg/dL — ABNORMAL HIGH (ref 0.44–1.00)
GFR, EST NON AFRICAN AMERICAN: 54 mL/min — AB (ref >60)
Glucose, Bld: 130 mg/dL — ABNORMAL HIGH (ref 65–99)
POTASSIUM: 3.6 mmol/L (ref 3.5–5.1)
SODIUM: 142 mmol/L (ref 135–145)

## 2015-08-19 LAB — CBC
HEMATOCRIT: 30.9 % — AB (ref 36.0–46.0)
HEMOGLOBIN: 9 g/dL — AB (ref 12.0–15.0)
MCH: 26 pg (ref 26.0–34.0)
MCHC: 29.1 g/dL — AB (ref 30.0–36.0)
MCV: 89.3 fL (ref 78.0–100.0)
PLATELETS: 170 10*3/uL (ref 150–400)
RBC: 3.46 MIL/uL — AB (ref 3.87–5.11)
RDW: 22.2 % — ABNORMAL HIGH (ref 11.5–15.5)
WBC: 8.7 10*3/uL (ref 4.0–10.5)

## 2015-08-20 LAB — BLOOD GAS, ARTERIAL
ACID-BASE EXCESS: 13.2 mmol/L — AB (ref 0.0–2.0)
BICARBONATE: 38.6 meq/L — AB (ref 20.0–24.0)
Delivery systems: POSITIVE
EXPIRATORY PAP: 8
FIO2: 0.45
Inspiratory PAP: 18
MODE: POSITIVE
O2 SAT: 97.8 %
PCO2 ART: 63 mmHg — AB (ref 35.0–45.0)
PH ART: 7.404 (ref 7.350–7.450)
PO2 ART: 110 mmHg — AB (ref 80.0–100.0)
Patient temperature: 98.6
TCO2: 40.5 mmol/L (ref 0–100)

## 2015-08-20 NOTE — Progress Notes (Signed)
Palliative Medicine Team RN Note (Re: phone call 08/20/15 at 0900)  Pt's nephew Kathrin Greathouse called PMT PA Imogene Burn, who is out of the office today. This patient has been d/c from [facility] and is now at [facility] inside the [employer]. Returned his call at [phone]. He expressed frustration regarding d/c process from [facility] and [facility] to [facility], and he also verbalized concerns about pt's care at [facility].   Mr. Shea Evans stated that he would like patient moved back to [facility]; I suggested he talk to DON or MD at [facility] about the process, as it is a separate entity from [facility] and we therefore are not involved in any of the care there. Mr. Shea Evans requested information re: MD who d/c'd pt from [place] to [facility] and the names of social worker/case manager who was involved; directed him to medical records to obtain this information.   Spoke with Quality RN at [facility] to convey family's concerns and desire for change in care venue.   Marjie Skiff Alexes Lamarque, RN, BSN, [facility] 08/20/2015 9:59 AM Cell [phone] 8:00-4:00 Monday-Friday Office [phone]

## 2015-08-21 LAB — CBC WITH DIFFERENTIAL/PLATELET
BASOS ABS: 0 10*3/uL (ref 0.0–0.1)
Basophils Relative: 0 %
Eosinophils Absolute: 0.1 10*3/uL (ref 0.0–0.7)
Eosinophils Relative: 1 %
HEMATOCRIT: 28.1 % — AB (ref 36.0–46.0)
HEMOGLOBIN: 8.5 g/dL — AB (ref 12.0–15.0)
LYMPHS PCT: 5 %
Lymphs Abs: 0.4 10*3/uL — ABNORMAL LOW (ref 0.7–4.0)
MCH: 27.2 pg (ref 26.0–34.0)
MCHC: 30.2 g/dL (ref 30.0–36.0)
MCV: 90.1 fL (ref 78.0–100.0)
MONOS PCT: 5 %
Monocytes Absolute: 0.4 10*3/uL (ref 0.1–1.0)
NEUTROS ABS: 7.6 10*3/uL (ref 1.7–7.7)
NEUTROS PCT: 89 %
PLATELETS: 172 10*3/uL (ref 150–400)
RBC: 3.12 MIL/uL — AB (ref 3.87–5.11)
RDW: 23.1 % — ABNORMAL HIGH (ref 11.5–15.5)
WBC: 8.5 10*3/uL (ref 4.0–10.5)

## 2015-08-21 LAB — RENAL FUNCTION PANEL
Albumin: 2.3 g/dL — ABNORMAL LOW (ref 3.5–5.0)
Anion gap: 10 (ref 5–15)
BUN: 60 mg/dL — AB (ref 6–20)
CHLORIDE: 95 mmol/L — AB (ref 101–111)
CO2: 36 mmol/L — AB (ref 22–32)
CREATININE: 1.35 mg/dL — AB (ref 0.44–1.00)
Calcium: 8.8 mg/dL — ABNORMAL LOW (ref 8.9–10.3)
GFR calc Af Amer: 46 mL/min — ABNORMAL LOW (ref >60)
GFR, EST NON AFRICAN AMERICAN: 39 mL/min — AB (ref >60)
Glucose, Bld: 124 mg/dL — ABNORMAL HIGH (ref 65–99)
POTASSIUM: 4.5 mmol/L (ref 3.5–5.1)
Phosphorus: 5.7 mg/dL — ABNORMAL HIGH (ref 2.5–4.6)
Sodium: 141 mmol/L (ref 135–145)

## 2015-08-21 LAB — MAGNESIUM: MAGNESIUM: 2.7 mg/dL — AB (ref 1.7–2.4)

## 2015-08-22 ENCOUNTER — Other Ambulatory Visit (HOSPITAL_COMMUNITY): Payer: Medicare Other

## 2015-08-22 LAB — CBC WITH DIFFERENTIAL/PLATELET
BASOS PCT: 0 %
Basophils Absolute: 0 10*3/uL (ref 0.0–0.1)
EOS ABS: 0 10*3/uL (ref 0.0–0.7)
Eosinophils Relative: 0 %
HCT: 29.2 % — ABNORMAL LOW (ref 36.0–46.0)
Hemoglobin: 8.4 g/dL — ABNORMAL LOW (ref 12.0–15.0)
LYMPHS ABS: 0.4 10*3/uL — AB (ref 0.7–4.0)
Lymphocytes Relative: 5 %
MCH: 26.5 pg (ref 26.0–34.0)
MCHC: 28.8 g/dL — AB (ref 30.0–36.0)
MCV: 92.1 fL (ref 78.0–100.0)
MONO ABS: 0.4 10*3/uL (ref 0.1–1.0)
Monocytes Relative: 5 %
NEUTROS ABS: 8 10*3/uL — AB (ref 1.7–7.7)
Neutrophils Relative %: 90 %
PLATELETS: 187 10*3/uL (ref 150–400)
RBC: 3.17 MIL/uL — ABNORMAL LOW (ref 3.87–5.11)
RDW: 22.9 % — ABNORMAL HIGH (ref 11.5–15.5)
WBC: 8.8 10*3/uL (ref 4.0–10.5)

## 2015-08-22 LAB — MAGNESIUM: MAGNESIUM: 2.8 mg/dL — AB (ref 1.7–2.4)

## 2015-08-22 LAB — RENAL FUNCTION PANEL
ANION GAP: 10 (ref 5–15)
Albumin: 2.4 g/dL — ABNORMAL LOW (ref 3.5–5.0)
BUN: 67 mg/dL — AB (ref 6–20)
CHLORIDE: 97 mmol/L — AB (ref 101–111)
CO2: 34 mmol/L — AB (ref 22–32)
Calcium: 9.1 mg/dL (ref 8.9–10.3)
Creatinine, Ser: 1.16 mg/dL — ABNORMAL HIGH (ref 0.44–1.00)
GFR calc Af Amer: 55 mL/min — ABNORMAL LOW (ref >60)
GFR calc non Af Amer: 47 mL/min — ABNORMAL LOW (ref >60)
GLUCOSE: 139 mg/dL — AB (ref 65–99)
Phosphorus: 6.2 mg/dL — ABNORMAL HIGH (ref 2.5–4.6)
Potassium: 4.2 mmol/L (ref 3.5–5.1)
SODIUM: 141 mmol/L (ref 135–145)

## 2015-08-24 ENCOUNTER — Other Ambulatory Visit (HOSPITAL_COMMUNITY): Payer: Medicare Other

## 2015-08-24 LAB — CBC WITH DIFFERENTIAL/PLATELET
BASOS PCT: 0 %
Basophils Absolute: 0 10*3/uL (ref 0.0–0.1)
EOS PCT: 0 %
Eosinophils Absolute: 0 10*3/uL (ref 0.0–0.7)
HCT: 31.9 % — ABNORMAL LOW (ref 36.0–46.0)
Hemoglobin: 9.1 g/dL — ABNORMAL LOW (ref 12.0–15.0)
LYMPHS ABS: 0.9 10*3/uL (ref 0.7–4.0)
Lymphocytes Relative: 9 %
MCH: 26.3 pg (ref 26.0–34.0)
MCHC: 28.5 g/dL — AB (ref 30.0–36.0)
MCV: 92.2 fL (ref 78.0–100.0)
MONO ABS: 0.5 10*3/uL (ref 0.1–1.0)
Monocytes Relative: 5 %
NEUTROS ABS: 8.2 10*3/uL — AB (ref 1.7–7.7)
Neutrophils Relative %: 86 %
PLATELETS: 216 10*3/uL (ref 150–400)
RBC: 3.46 MIL/uL — ABNORMAL LOW (ref 3.87–5.11)
RDW: 22.7 % — AB (ref 11.5–15.5)
WBC: 9.6 10*3/uL (ref 4.0–10.5)

## 2015-08-24 LAB — MAGNESIUM: Magnesium: 2.8 mg/dL — ABNORMAL HIGH (ref 1.7–2.4)

## 2015-08-24 LAB — RENAL FUNCTION PANEL
Albumin: 2.6 g/dL — ABNORMAL LOW (ref 3.5–5.0)
Anion gap: 11 (ref 5–15)
BUN: 69 mg/dL — AB (ref 6–20)
CHLORIDE: 98 mmol/L — AB (ref 101–111)
CO2: 34 mmol/L — ABNORMAL HIGH (ref 22–32)
Calcium: 9.4 mg/dL (ref 8.9–10.3)
Creatinine, Ser: 0.9 mg/dL (ref 0.44–1.00)
GFR calc Af Amer: >60 mL/min (ref >60)
GFR calc non Af Amer: >60 mL/min (ref >60)
GLUCOSE: 122 mg/dL — AB (ref 65–99)
POTASSIUM: 4.5 mmol/L (ref 3.5–5.1)
Phosphorus: 4.5 mg/dL (ref 2.5–4.6)
Sodium: 143 mmol/L (ref 135–145)

## 2015-08-25 ENCOUNTER — Other Ambulatory Visit (HOSPITAL_COMMUNITY): Payer: Medicare Other

## 2015-08-26 ENCOUNTER — Other Ambulatory Visit (HOSPITAL_COMMUNITY): Payer: Medicare Other

## 2015-08-26 LAB — CBC
HCT: 21.8 % — ABNORMAL LOW (ref 36.0–46.0)
Hemoglobin: 6.2 g/dL — CL (ref 12.0–15.0)
MCH: 26.8 pg (ref 26.0–34.0)
MCHC: 28.4 g/dL — ABNORMAL LOW (ref 30.0–36.0)
MCV: 94.4 fL (ref 78.0–100.0)
PLATELETS: 229 10*3/uL (ref 150–400)
RBC: 2.31 MIL/uL — ABNORMAL LOW (ref 3.87–5.11)
RDW: 22.9 % — AB (ref 11.5–15.5)
WBC: 10.1 10*3/uL (ref 4.0–10.5)

## 2015-08-26 LAB — RENAL FUNCTION PANEL
Albumin: 2.3 g/dL — ABNORMAL LOW (ref 3.5–5.0)
Anion gap: 9 (ref 5–15)
BUN: 57 mg/dL — ABNORMAL HIGH (ref 6–20)
CALCIUM: 9.3 mg/dL (ref 8.9–10.3)
CHLORIDE: 98 mmol/L — AB (ref 101–111)
CO2: 35 mmol/L — ABNORMAL HIGH (ref 22–32)
CREATININE: 0.73 mg/dL (ref 0.44–1.00)
Glucose, Bld: 137 mg/dL — ABNORMAL HIGH (ref 65–99)
Phosphorus: 3.8 mg/dL (ref 2.5–4.6)
Potassium: 4.8 mmol/L (ref 3.5–5.1)
SODIUM: 142 mmol/L (ref 135–145)

## 2015-08-26 LAB — CBC WITH DIFFERENTIAL/PLATELET
BASOS ABS: 0 10*3/uL (ref 0.0–0.1)
Basophils Relative: 0 %
EOS PCT: 1 %
Eosinophils Absolute: 0.1 10*3/uL (ref 0.0–0.7)
HCT: 29.2 % — ABNORMAL LOW (ref 36.0–46.0)
HEMOGLOBIN: 8.2 g/dL — AB (ref 12.0–15.0)
LYMPHS PCT: 10 %
Lymphs Abs: 0.9 10*3/uL (ref 0.7–4.0)
MCH: 26.2 pg (ref 26.0–34.0)
MCHC: 28.1 g/dL — ABNORMAL LOW (ref 30.0–36.0)
MCV: 93.3 fL (ref 78.0–100.0)
MONOS PCT: 4 %
Monocytes Absolute: 0.4 10*3/uL (ref 0.1–1.0)
NEUTROS ABS: 8 10*3/uL — AB (ref 1.7–7.7)
Neutrophils Relative %: 85 %
Platelets: 235 10*3/uL (ref 150–400)
RBC: 3.13 MIL/uL — ABNORMAL LOW (ref 3.87–5.11)
RDW: 22.9 % — ABNORMAL HIGH (ref 11.5–15.5)
WBC: 9.4 10*3/uL (ref 4.0–10.5)

## 2015-08-26 LAB — ABO/RH: ABO/RH(D): A POS

## 2015-08-26 LAB — PREPARE RBC (CROSSMATCH)

## 2015-08-26 LAB — VANCOMYCIN, TROUGH: VANCOMYCIN TR: 6 ug/mL — AB (ref 15–20)

## 2015-08-26 LAB — MAGNESIUM: MAGNESIUM: 2.8 mg/dL — AB (ref 1.7–2.4)

## 2015-08-27 ENCOUNTER — Encounter: Payer: Self-pay | Admitting: Physician Assistant

## 2015-08-27 ENCOUNTER — Inpatient Hospital Stay (HOSPITAL_COMMUNITY)
Admission: AD | Admit: 2015-08-27 | Discharge: 2015-09-10 | DRG: 296 | Disposition: E | Payer: Medicare Other | Source: Other Acute Inpatient Hospital | Attending: Pulmonary Disease | Admitting: Pulmonary Disease

## 2015-08-27 ENCOUNTER — Inpatient Hospital Stay (HOSPITAL_COMMUNITY): Payer: Medicare Other

## 2015-08-27 ENCOUNTER — Other Ambulatory Visit (HOSPITAL_COMMUNITY): Payer: Medicare Other

## 2015-08-27 DIAGNOSIS — E872 Acidosis: Secondary | ICD-10-CM | POA: Diagnosis present

## 2015-08-27 DIAGNOSIS — J9601 Acute respiratory failure with hypoxia: Secondary | ICD-10-CM | POA: Diagnosis not present

## 2015-08-27 DIAGNOSIS — J9690 Respiratory failure, unspecified, unspecified whether with hypoxia or hypercapnia: Secondary | ICD-10-CM

## 2015-08-27 DIAGNOSIS — K7200 Acute and subacute hepatic failure without coma: Secondary | ICD-10-CM | POA: Diagnosis present

## 2015-08-27 DIAGNOSIS — I4891 Unspecified atrial fibrillation: Secondary | ICD-10-CM | POA: Diagnosis present

## 2015-08-27 DIAGNOSIS — R571 Hypovolemic shock: Secondary | ICD-10-CM | POA: Diagnosis present

## 2015-08-27 DIAGNOSIS — Z515 Encounter for palliative care: Secondary | ICD-10-CM | POA: Diagnosis present

## 2015-08-27 DIAGNOSIS — Z923 Personal history of irradiation: Secondary | ICD-10-CM

## 2015-08-27 DIAGNOSIS — I509 Heart failure, unspecified: Secondary | ICD-10-CM | POA: Diagnosis present

## 2015-08-27 DIAGNOSIS — K922 Gastrointestinal hemorrhage, unspecified: Secondary | ICD-10-CM | POA: Diagnosis present

## 2015-08-27 DIAGNOSIS — J982 Interstitial emphysema: Secondary | ICD-10-CM | POA: Diagnosis present

## 2015-08-27 DIAGNOSIS — J939 Pneumothorax, unspecified: Secondary | ICD-10-CM | POA: Diagnosis present

## 2015-08-27 DIAGNOSIS — E875 Hyperkalemia: Secondary | ICD-10-CM | POA: Diagnosis present

## 2015-08-27 DIAGNOSIS — J449 Chronic obstructive pulmonary disease, unspecified: Secondary | ICD-10-CM | POA: Diagnosis present

## 2015-08-27 DIAGNOSIS — Z85118 Personal history of other malignant neoplasm of bronchus and lung: Secondary | ICD-10-CM

## 2015-08-27 DIAGNOSIS — J9602 Acute respiratory failure with hypercapnia: Secondary | ICD-10-CM | POA: Diagnosis present

## 2015-08-27 DIAGNOSIS — I110 Hypertensive heart disease with heart failure: Secondary | ICD-10-CM | POA: Diagnosis present

## 2015-08-27 DIAGNOSIS — N179 Acute kidney failure, unspecified: Secondary | ICD-10-CM | POA: Diagnosis present

## 2015-08-27 DIAGNOSIS — E785 Hyperlipidemia, unspecified: Secondary | ICD-10-CM | POA: Diagnosis present

## 2015-08-27 DIAGNOSIS — Z66 Do not resuscitate: Secondary | ICD-10-CM | POA: Diagnosis present

## 2015-08-27 DIAGNOSIS — I469 Cardiac arrest, cause unspecified: Principal | ICD-10-CM | POA: Diagnosis present

## 2015-08-27 DIAGNOSIS — I472 Ventricular tachycardia: Secondary | ICD-10-CM | POA: Diagnosis present

## 2015-08-27 DIAGNOSIS — G9340 Encephalopathy, unspecified: Secondary | ICD-10-CM | POA: Diagnosis present

## 2015-08-27 LAB — BLOOD GAS, ARTERIAL
ACID-BASE DEFICIT: 7.1 mmol/L — AB (ref 0.0–2.0)
ACID-BASE EXCESS: 5.2 mmol/L — AB (ref 0.0–2.0)
BICARBONATE: 22.5 meq/L (ref 20.0–24.0)
BICARBONATE: 31 meq/L — AB (ref 20.0–24.0)
FIO2: 1
FIO2: 1
O2 SAT: 98.7 %
O2 Saturation: 97 %
PATIENT TEMPERATURE: 98.6
PATIENT TEMPERATURE: 98.6
PCO2 ART: 88.7 mmHg — AB (ref 35.0–45.0)
PEEP/CPAP: 5 cmH2O
PO2 ART: 129 mmHg — AB (ref 80.0–100.0)
PO2 ART: 133 mmHg — AB (ref 80.0–100.0)
RATE: 20 resp/min
TCO2: 25.2 mmol/L (ref 0–100)
TCO2: 32.9 mmol/L (ref 0–100)
VT: 400 mL
pCO2 arterial: 62 mmHg (ref 35.0–45.0)
pH, Arterial: 7.032 — CL (ref 7.350–7.450)
pH, Arterial: 7.319 — ABNORMAL LOW (ref 7.350–7.450)

## 2015-08-27 LAB — COMPREHENSIVE METABOLIC PANEL
ALBUMIN: 1.3 g/dL — AB (ref 3.5–5.0)
ALT: 2534 U/L — ABNORMAL HIGH (ref 14–54)
ANION GAP: 12 (ref 5–15)
AST: 4759 U/L — ABNORMAL HIGH (ref 15–41)
Alkaline Phosphatase: 162 U/L — ABNORMAL HIGH (ref 38–126)
BILIRUBIN TOTAL: 1.1 mg/dL (ref 0.3–1.2)
BUN: 82 mg/dL — ABNORMAL HIGH (ref 6–20)
CHLORIDE: 111 mmol/L (ref 101–111)
CO2: 25 mmol/L (ref 22–32)
Calcium: 7 mg/dL — ABNORMAL LOW (ref 8.9–10.3)
Creatinine, Ser: 1.64 mg/dL — ABNORMAL HIGH (ref 0.44–1.00)
GFR calc Af Amer: 36 mL/min — ABNORMAL LOW (ref >60)
GFR calc non Af Amer: 31 mL/min — ABNORMAL LOW (ref >60)
GLUCOSE: 76 mg/dL (ref 65–99)
POTASSIUM: 7.1 mmol/L — AB (ref 3.5–5.1)
SODIUM: 148 mmol/L — AB (ref 135–145)
TOTAL PROTEIN: 3.2 g/dL — AB (ref 6.5–8.1)

## 2015-08-27 LAB — CBC WITH DIFFERENTIAL/PLATELET
BAND NEUTROPHILS: 27 %
BLASTS: 0 %
Basophils Absolute: 0 10*3/uL (ref 0.0–0.1)
Basophils Relative: 0 %
EOS ABS: 0.2 10*3/uL (ref 0.0–0.7)
EOS PCT: 1 %
HCT: 27.9 % — ABNORMAL LOW (ref 36.0–46.0)
HEMOGLOBIN: 8.8 g/dL — AB (ref 12.0–15.0)
LYMPHS PCT: 29 %
Lymphs Abs: 6 10*3/uL — ABNORMAL HIGH (ref 0.7–4.0)
MCH: 29 pg (ref 26.0–34.0)
MCHC: 31.5 g/dL (ref 30.0–36.0)
MCV: 92.1 fL (ref 78.0–100.0)
MONOS PCT: 2 %
Metamyelocytes Relative: 7 %
Monocytes Absolute: 0.4 10*3/uL (ref 0.1–1.0)
Myelocytes: 8 %
NEUTROS ABS: 14.1 10*3/uL — AB (ref 1.7–7.7)
Neutrophils Relative %: 25 %
OTHER: 0 %
Platelets: 235 10*3/uL (ref 150–400)
Promyelocytes Absolute: 1 %
RBC: 3.03 MIL/uL — ABNORMAL LOW (ref 3.87–5.11)
RDW: 16.9 % — ABNORMAL HIGH (ref 11.5–15.5)
WBC Morphology: INCREASED
WBC: 20.7 10*3/uL — ABNORMAL HIGH (ref 4.0–10.5)
nRBC: 12 /100 WBC — ABNORMAL HIGH

## 2015-08-27 LAB — BASIC METABOLIC PANEL
ANION GAP: 7 (ref 5–15)
BUN: 82 mg/dL — ABNORMAL HIGH (ref 6–20)
CO2: 29 mmol/L (ref 22–32)
Calcium: 7.9 mg/dL — ABNORMAL LOW (ref 8.9–10.3)
Chloride: 107 mmol/L (ref 101–111)
Creatinine, Ser: 1.46 mg/dL — ABNORMAL HIGH (ref 0.44–1.00)
GFR, EST AFRICAN AMERICAN: 41 mL/min — AB (ref >60)
GFR, EST NON AFRICAN AMERICAN: 36 mL/min — AB (ref >60)
GLUCOSE: 110 mg/dL — AB (ref 65–99)
POTASSIUM: 5.8 mmol/L — AB (ref 3.5–5.1)
SODIUM: 143 mmol/L (ref 135–145)

## 2015-08-27 LAB — AEROBIC CULTURE W GRAM STAIN (SUPERFICIAL SPECIMEN)

## 2015-08-27 LAB — PREPARE RBC (CROSSMATCH)

## 2015-08-27 LAB — CBC
HCT: 23.7 % — ABNORMAL LOW (ref 36.0–46.0)
Hemoglobin: 7.4 g/dL — ABNORMAL LOW (ref 12.0–15.0)
MCH: 28.5 pg (ref 26.0–34.0)
MCHC: 31.2 g/dL (ref 30.0–36.0)
MCV: 91.2 fL (ref 78.0–100.0)
PLATELETS: 275 10*3/uL (ref 150–400)
RBC: 2.6 MIL/uL — AB (ref 3.87–5.11)
RDW: 19.1 % — ABNORMAL HIGH (ref 11.5–15.5)
WBC: 9.9 10*3/uL (ref 4.0–10.5)

## 2015-08-27 LAB — LACTIC ACID, PLASMA: LACTIC ACID, VENOUS: 9.1 mmol/L — AB (ref 0.5–1.9)

## 2015-08-27 LAB — PROTIME-INR
INR: 1.42 (ref 0.00–1.49)
INR: 1.9 — AB (ref 0.00–1.49)
PROTHROMBIN TIME: 17.4 s — AB (ref 11.6–15.2)
PROTHROMBIN TIME: 21.7 s — AB (ref 11.6–15.2)

## 2015-08-27 LAB — AEROBIC CULTURE  (SUPERFICIAL SPECIMEN)

## 2015-08-27 LAB — MAGNESIUM: MAGNESIUM: 3.4 mg/dL — AB (ref 1.7–2.4)

## 2015-08-27 LAB — PHOSPHORUS: PHOSPHORUS: 8.6 mg/dL — AB (ref 2.5–4.6)

## 2015-08-27 LAB — TROPONIN I: TROPONIN I: 0.07 ng/mL — AB (ref <0.03)

## 2015-08-27 LAB — HEMOGLOBIN AND HEMATOCRIT, BLOOD
HCT: 24.5 % — ABNORMAL LOW (ref 36.0–46.0)
Hemoglobin: 7.3 g/dL — ABNORMAL LOW (ref 12.0–15.0)

## 2015-08-27 LAB — APTT: APTT: 44 s — AB (ref 24–37)

## 2015-08-27 MED ORDER — SODIUM CHLORIDE 0.9 % IV SOLN: Freq: Once | INTRAVENOUS | Status: AC

## 2015-08-27 MED ORDER — SODIUM CHLORIDE 0.9% FLUSH
10.0000 mL | Freq: Two times a day (BID) | INTRAVENOUS | Status: DC
Start: 2015-08-27 — End: 2015-08-28

## 2015-08-27 MED ORDER — SODIUM CHLORIDE 0.9 % IV SOLN: Freq: Once | INTRAVENOUS | Status: DC

## 2015-08-27 MED ORDER — SODIUM CHLORIDE 0.9% FLUSH: 10.0000 mL | INTRAVENOUS | Status: DC | PRN

## 2015-08-27 MED ORDER — MIDAZOLAM HCL 2 MG/2ML IJ SOLN
1.0000 mg | INTRAMUSCULAR | Status: DC | PRN
  Administered 2015-08-27: 2 mg via INTRAVENOUS
  Filled 2015-08-27: qty 2

## 2015-08-27 MED ORDER — EPINEPHRINE HCL 0.1 MG/ML IJ SOSY
PREFILLED_SYRINGE | INTRAMUSCULAR | Status: AC
  Filled 2015-08-27: qty 40

## 2015-08-27 MED ORDER — IPRATROPIUM-ALBUTEROL 0.5-2.5 (3) MG/3ML IN SOLN
3.0000 mL | Freq: Four times a day (QID) | RESPIRATORY_TRACT | Status: DC
  Administered 2015-08-27: 3 mL via RESPIRATORY_TRACT
  Filled 2015-08-27: qty 3

## 2015-08-27 MED ORDER — SODIUM CHLORIDE 0.9 % IV SOLN
Freq: Once | INTRAVENOUS | Status: AC
  Administered 2015-08-27: 14:00:00 via INTRAVENOUS

## 2015-08-27 MED ORDER — SODIUM CHLORIDE 0.9 % IV SOLN
8.0000 mg/h | INTRAVENOUS | Status: DC
  Administered 2015-08-27: 8 mg/h via INTRAVENOUS
  Filled 2015-08-27 (×2): qty 80

## 2015-08-27 MED ORDER — SODIUM BICARBONATE 8.4 % IV SOLN
INTRAVENOUS | Status: AC
  Filled 2015-08-27: qty 200

## 2015-08-27 MED ORDER — STERILE WATER FOR INJECTION IV SOLN
INTRAVENOUS | Status: DC
  Administered 2015-08-27: 14:00:00 via INTRAVENOUS
  Filled 2015-08-27 (×3): qty 850

## 2015-08-27 MED ORDER — SODIUM CHLORIDE 0.9 % IV SOLN: 250.0000 mL | INTRAVENOUS | Status: DC | PRN

## 2015-08-27 MED ORDER — ATROPINE SULFATE 1 % OP SOLN
1.0000 [drp] | Freq: Four times a day (QID) | OPHTHALMIC | Status: DC | PRN
  Filled 2015-08-27: qty 2

## 2015-08-27 MED ORDER — VASOPRESSIN 20 UNIT/ML IV SOLN
0.2000 [IU]/min | INTRAVENOUS | Status: DC
  Administered 2015-08-27: 0.2 [IU]/min via INTRAVENOUS
  Filled 2015-08-27: qty 5

## 2015-08-27 MED ORDER — DEXTROSE 5 % IV SOLN
10.0000 mg/h | INTRAVENOUS | Status: DC
  Filled 2015-08-27: qty 10

## 2015-08-27 MED ORDER — MORPHINE BOLUS VIA INFUSION
5.0000 mg | INTRAVENOUS | Status: DC | PRN
  Filled 2015-08-27: qty 20

## 2015-08-27 MED ORDER — PHENYLEPHRINE HCL 10 MG/ML IJ SOLN
0.0000 ug/min | INTRAMUSCULAR | Status: DC
  Administered 2015-08-27: 50 ug/min via INTRAVENOUS
  Filled 2015-08-27 (×2): qty 1

## 2015-08-27 MED ORDER — SODIUM CHLORIDE 0.9 % IV BOLUS (SEPSIS): 1000.0000 mL | Freq: Once | INTRAVENOUS | Status: DC

## 2015-08-27 MED ORDER — SODIUM CHLORIDE 0.9 % IV SOLN
80.0000 mg | Freq: Once | INTRAVENOUS | Status: AC
  Administered 2015-08-27: 80 mg via INTRAVENOUS
  Filled 2015-08-27: qty 80

## 2015-08-27 MED ORDER — NOREPINEPHRINE BITARTRATE 1 MG/ML IV SOLN
0.0000 ug/min | INTRAVENOUS | Status: DC
  Administered 2015-08-27 (×2): 40 ug/min via INTRAVENOUS
  Filled 2015-08-27 (×2): qty 4

## 2015-08-27 MED FILL — Medication: Qty: 1 | Status: AC

## 2015-08-28 LAB — TYPE AND SCREEN
ABO/RH(D): A POS
Antibody Screen: NEGATIVE
UNIT DIVISION: 0
UNIT DIVISION: 0
UNIT DIVISION: 0
Unit division: 0
Unit division: 0
Unit division: 0
Unit division: 0
Unit division: 0

## 2015-08-28 LAB — PREPARE FRESH FROZEN PLASMA
UNIT DIVISION: 0
Unit division: 0

## 2015-08-28 MED FILL — Medication: Qty: 1 | Status: AC

## 2015-08-31 LAB — TYPE AND SCREEN
ABO/RH(D): A POS
ANTIBODY SCREEN: NEGATIVE
UNIT DIVISION: 0
UNIT DIVISION: 0
Unit division: 0
Unit division: 0

## 2015-09-02 ENCOUNTER — Ambulatory Visit: Payer: Medicare Other | Admitting: Pulmonary Disease

## 2015-09-04 ENCOUNTER — Telehealth: Payer: Self-pay

## 2015-09-04 NOTE — Telephone Encounter (Signed)
On 09/04/2015 I received a death certificate from [place]. The death certificate is for cremation. The patient is a patient of [facility]. The death certificate will be taken to Pulmonary Unit @ [facility] this pm for signature. On Sep 25, 2015 I received the death certificate back from [place]. I got the death certificate ready and called the funeral home to let them know the death certificate is ready for pickup. I also faxed a copy per the funeral home request.

## 2015-09-10 NOTE — Progress Notes (Signed)
Called Dr. Oletta Darter and discussed patient max dose of Levophed and BP remains very low. Family aware and still awaiting for extubation.

## 2015-09-10 NOTE — Discharge Summary (Signed)
DEATH NOTE: Patient was a 70 year old female with a known history of COPD, atrial fibrillation, and stage IIB non-small cell lung cancer. Plan patient was admitted recently to [facility] with altered mental status and hypercarbic respiratory failure requiring endotracheal intubation. Patient was subsequently extubated and ultimately transferred to [facility] for further rehabilitation needs. Patient developed progressively worsening hypoxic respiratory failure overnight prior to her transfer to the intensive care unit on Sep 24, 2015 as well as gastrointestinal bleeding. She was transitioned ultimately to BiPAP for respiratory support. I was consulted by Dr. Tiffany Kocher to provide further assessment of the patient's acute hypoxic respiratory failure the morning of 7/18. Upon my arrival patient was actively clinically deteriorating with a falling blood pressure, worsening hypoxia, and nonresponsive. Nurse was at bedside and without a palpable pulse I requested she initiate a CODE BLUE. Please refer to my documentation from the patient's resuscitation. Upon regaining spontaneous circulation and after endotracheal intubation by myself I recommended transfer to a [facility] intensive care unit for further stabilization and treatment of the patient's critical illness.  Dr. Tiffany Kocher and the [place] declined transfer despite my recommendation. I further recommended initiating a Protonix infusion after bolus, continuing blood product and IV fluid resuscitation, and establishing additional IV access beyond the patient's single peripheral IV. Before my consultation documentation could be complete the patient had a subsequent cardiac arrest and I was contacted to accept the patient in transfer to our intensive care unit. After regaining spontaneous circulation we transitioned the patient to the intensive care unit. After arrival while attempting to further stabilize the patient  with blood products and fluid resuscitation as well as establish central venous access the patient had a third cardiac arrest. After the patient was resuscitated and we regained spontaneous circulation the patient had worsening ventilation with elevated peak pressures and palpation of subcutaneous emphysema. She was found have a large right-sided pneumothorax. I discussed the patient's multisystem organ failure, repeated cardiac arrest, and now new right-sided pneumothorax with the patient's daughter Carlis Stable by phone. Given the patient's continued clinical deterioration after this conversation she decided to make her mother a DO NOT RESUSCITATE and the decision was made to hold off on placing a right-sided chest tube. Upon her arrival I updated her further regarding her mother's continued clinical deterioration and multisystem organ failure. She contacted additional family members in an effort to have them at bedside before and while transitioning over to full comfort care. Despite our best efforts and maximal medical support the patient became progressively more hypotensive and ultimately passed at 1901 p.m. on 09-24-2015 as verified by 2 nurses at bedside. Family was at bedside.  DIAGNOSES AT DEATH: 1. Hypovolemic Shock 2. Acute Encephalopathy 3. Large Right Pneumothorax 4. Acute Hypoxic Respiratory Failure 5. Acute Hypercarbic Respiratory Failure 6. Hyperkalemia 7. Lactic Acidosis 8. Shock Liver 9. Acute Renal Failure 10. Acute Gastrointestinal Bleed 11. Coagulopathy 12. History of Depression 13. History of Stage IIB NSCLC 14. History of COPD

## 2015-09-10 NOTE — Progress Notes (Signed)
PCCM Attending Note: Family discussion with patient's daughter at bedside. Explained that with multi-system organ failure she is highly unlikely to survive. Answered all of her questions. She reports her mother would not wish to continue in pain. We discussed proceeding with full comfort care measures. She is going to contact family. I have ordered the palliative order set and she will notify bedside RN once family arrive and they are ready to proceed with terminal vent wean with extubation followed by discontinuing vasopressor infusions. I have cancelled patient's labs. She will remains DNR.   Sonia Baller Ashok Cordia, M.D. [facility] Pulmonary & Critical [facility] Pager:  [phone] After 3pm or if no response, call [phone] 3:32 PM 08/31/2015

## 2015-09-10 NOTE — Procedures (Signed)
Central Venous Catheter Insertion Procedure Note Kelsey Garcia [id number] January 20, 1946  Procedure: Insertion of Central Venous Catheter Indications: Assessment of intravascular volume, Drug and/or fluid administration and Frequent blood sampling  Procedure Details Consent: Unable to obtain consent because of emergent medical necessity. Time Out: Verified patient identification, verified procedure, site/side was marked, verified correct patient position, special equipment/implants available, medications/allergies/relevent history reviewed, required imaging and test results available.  Performed  Maximum sterile technique was used including antiseptics, cap, gloves, gown, hand hygiene, mask and sheet. Skin prep: Chlorhexidine; local anesthetic administered A antimicrobial bonded/coated triple lumen catheter was placed in the right femoral vein due to emergent situation using the Seldinger technique.  Evaluation Blood flow good Complications: No apparent complications Patient did tolerate procedure well. Chest X-ray ordered to verify placement.  Not needed.  Procedure emergently during cardiac arrest / code blue.   Kelsey Garcia, Kelsey Garcia Pulmonary & Critical Care Medicine Pager: [phone]  or [phone] 08/30/15, 12:59 PM
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
## 2015-09-10 NOTE — Progress Notes (Signed)
Events and plans noted, will sign off.  Cleotis Nipper, M.D. Pager [phone] If no answer or after 5 PM call [phone]

## 2015-09-10 NOTE — Progress Notes (Signed)
Time of death 1901 verified at bedside with 2 RNs [place] and [place] Ree RN. Primary MD team Dr. Oletta Darter advised. Family at bedside now.

## 2015-09-10 NOTE — Progress Notes (Signed)
Blood products administered emergent release rapid transfuse as per Dr. Ashok Cordia.

## 2015-09-10 NOTE — Progress Notes (Signed)
Family at bedside awaiting siblings for withdrawal of care and vent support.

## 2015-09-10 NOTE — Progress Notes (Signed)
Family at bedside. Dr. Ashok Cordia on the way. Emotional support provided.

## 2015-09-10 NOTE — Progress Notes (Addendum)
PCCM Attending Note: Contact the patient's daughter via phone regarding third cardiac arrest and now a right sided pneumothorax with subcutaneous emphysema. Worsening hyperkalemia likely secondary to poor ventilation with pneumothorax. Discussed the fact that without chest tube placement respiratory status and clinical status will thereby continue to worsen. Given the patient's multiple medical problems and continuing decline in clinical status patient's daughter elected to hold off on placement of a chest tube at this time and has decided to transition to a DO NOT RESUSCITATE status. We will continue to support the patient maximally medically while awaiting her arrival.  Sonia Baller. Ashok Cordia, M.D. [facility] Pulmonary & Critical Care Pager:  [phone] After 3pm or if no response, call [phone] 2:12 PM 2015/09/07

## 2015-09-10 NOTE — Consult Note (Signed)
PULMONARY / CRITICAL CARE MEDICINE   Name: Kelsey Garcia MRN: [id number] DOB: [birth year]    ADMISSION DATE:  08/09/2015 CONSULTATION DATE:  2015-09-10  REFERRING MD:  Dr. Ulice Bold / [facility]  CHIEF COMPLAINT:  Acute Hypoxic Respiratory Failure  HISTORY OF PRESENT ILLNESS:  70 year old female with known history of COPD. Patient also has a history of stage IIB non-small cell lung cancer. Recently admitted for community-acquired pneumonia and COPD exacerbation. Patient was subsequently extubated after short intubation. Patient ultimately transferred to [facility] for further rehabilitation and medical management on 6/30. Patient progressively became more hypoxic overnight  PAST MEDICAL HISTORY :  Past Medical History  Diagnosis Date  . Atrial fibrillation ([place])   . Hypertension   . Insomnia   . Nonischemic cardiomyopathy ([place])   . Hyperlipidemia   . Anxiety   . Cholelithiasis   . Mitral regurgitation     noted 2010  . H/O epistaxis   . Rhinitis, allergic   . CHF (congestive heart failure) ([place])   . Pneumonia     "several times w/exacerbations of the COPD; nothing in the last year" (07/15/2012)  . COPD (chronic obstructive pulmonary disease) ([place])     as of 7/13 on 2-3L, pfts 10/2008 with mod obstruction  . Chronic bronchitis with COPD (chronic obstructive pulmonary disease) ([place])   . GERD (gastroesophageal reflux disease)   . Migraines     "weekly for awhile; cleared up as I got older" (07/15/2012)  . DJD (degenerative joint disease)   . Arthritis     "all over" (07/15/2012)  . OCD (obsessive compulsive disorder)   . Depression     h/o SI; "last time I was really serious about it was ~ 1997" (07/15/2012)  . Thoracic vertebral fracture ([place]) 11/03/2014  . Primary lung cancer ([place]) 02/2015    stage IIB (T3, N0, M0) non-small cell lung cancer, squamous cell carcinoma. s/p XRT.    PAST SURGICAL HISTORY: Past Surgical History  Procedure Laterality Date  . Tubal ligation   1972  . Cardioversion  2003; 07/2003  . Video bronchoscopy Bilateral 02/27/2015    Procedure: VIDEO BRONCHOSCOPY WITH FLUORO;  Surgeon: Chesley Mires, MD;  Location: [facility] ENDOSCOPY;  Service: Cardiopulmonary;  Laterality: Bilateral;     Allergies  Allergen Reactions  . Ace Inhibitors Cough  . Codeine Swelling    Face and lips swelling  Patient report Can tolerate hydrocodone  . Pseudoeph-Doxylamine-Dm-Apap Itching  . Diphenhydramine Hcl Other (See Comments)    "feels like my skin is crawling, and legs twitches"  . Erythromycin Diarrhea  . Nsaids Nausea And Vomiting  . Nyquil [Pseudoeph-Doxylamine-Dm-Apap] Itching  . Tramadol Hcl Other (See Comments)    stomach pain, hallucinations  . Fosamax [Alendronate] Other (See Comments)    Nausea and abdominal bloating.   . Tomato Other (See Comments)    Stomach swells  . Orange Fruit [Citrus] Rash  . Pseudoephedrine Palpitations    No current facility-administered medications on file prior to encounter.   Current Outpatient Prescriptions on File Prior to Encounter  Medication Sig  . albuterol (PROVENTIL) (2.5 MG/3ML) 0.083% nebulizer solution Take 3 mLs (2.5 mg total) by nebulization every 3 (three) hours as needed for wheezing or shortness of breath.  Marland Kitchen antiseptic oral rinse (CPC / CETYLPYRIDINIUM CHLORIDE 0.05%) 0.05 % LIQD solution 7 mLs by Mouth Rinse route 2 (two) times daily.  Marland Kitchen aspirin 325 MG tablet Place 1 tablet (325 mg total) into feeding tube daily.  . bisacodyl (DULCOLAX) 10 MG  suppository Place 1 suppository (10 mg total) rectally daily as needed for moderate constipation.  . busPIRone (BUSPAR) 10 MG tablet Place 1 tablet (10 mg total) into feeding tube 3 (three) times daily.  . diazepam (VALIUM) 2 MG tablet Take 1 tablet (2 mg total) by mouth every 8 (eight) hours.  Marland Kitchen diltiazem (CARDIZEM) 120 MG tablet Take 1 tablet (120 mg total) by mouth every 8 (eight) hours.  Marland Kitchen FLUoxetine (PROZAC) 20 MG capsule Take 1 capsule (20 mg total)  by mouth daily.  . food thickener (THICK IT) POWD Take 1 Container by mouth as needed (use to make honey consistency).  . furosemide (LASIX) 40 MG tablet Take 40 mg by mouth daily.  . heparin 5000 UNIT/ML injection Inject 1 mL (5,000 Units total) into the skin every 8 (eight) hours.  . insulin aspart (NOVOLOG) 100 UNIT/ML injection CBG < 70: implement hypoglycemia protocol CBG 70 - 120: 0 units CBG 121 - 150: 2 units CBG 151 - 200: 3 units CBG 201 - 250: 5 units CBG 251 - 300: 8 units CBG 301 - 350: 11 units CBG 351 - 400: 15 units CBG > 400: call MD and obtain STAT lab verification  . ipratropium-albuterol (DUONEB) 0.5-2.5 (3) MG/3ML SOLN Take 3 mLs by nebulization every 6 (six) hours.  . methylPREDNISolone sodium succinate (SOLU-MEDROL) 40 mg/mL injection Inject 1 mL (40 mg total) into the vein daily.  . metoprolol tartrate (LOPRESSOR) 25 MG tablet Take 0.5 tablets (12.5 mg total) by mouth 2 (two) times daily.  . OXYGEN Inhale 2 L into the lungs continuous. 2-3L/min  . pantoprazole (PROTONIX) 40 MG tablet Take 1 tablet (40 mg total) by mouth daily.  . potassium chloride SA (KLOR-CON M20) 20 MEQ tablet Take 20 mEq by mouth daily.   . sodium chloride 0.9 % infusion Inject 10 mLs into the vein continuous.    FAMILY HISTORY:  Family History  Problem Relation Age of Onset  . Asthma    . Emphysema    . Allergies    . Cancer      aunt had several types of cancer  . COPD Mother   . Emphysema Mother   . Cirrhosis Father     SOCIAL HISTORY: Social History  Substance Use Topics  . Smoking status: Former Smoker -- 1.00 packs/day for 35 years    Types: Cigarettes    Quit date: 12/10/2014  . Smokeless tobacco: Never Used  . Alcohol Use: No    REVIEW OF SYSTEMS:  Unable to obtain secondary to altered mental status.  SUBJECTIVE: As above.  VITAL SIGNS:  Saturation 46% on BiPAP   BP 50/12   HR 143  HEMODYNAMICS:    VENTILATOR SETTINGS:    INTAKE / OUTPUT:    PHYSICAL  EXAMINATION: General: Nonresponsive. No family at bedside. Nurse at bedside along with PCCM NP.  Integument:  Cool & dry. No rash on exposed skin. Mottling noted in extremities. Bruising of various ages noted on extremities. Lymphatics:  No appreciated cervical or supraclavicular lymphadenoapthy. HEENT:  Dry mucus membranes. No oral ulcers. No scleral icterus. BiPAP mask in place. Cardiovascular:  Tachycardic. No palpable pulse at onset of ACLS protocol. ROSC after ACLS protocol.   Pulmonary: BiPAP currently on but agonal respirations.  Abdomen: Soft. Protuberant. Hematochezia noted in the bed.. Musculoskeletal:  No joint deformity or effusion appreciated. Neurological:  Flaccid. No purposeful movement. Forward gaze.  Psychiatric:  Unable to assess given altered mental status.  LABS:  BMET  Recent  Labs Lab 08/24/15 0640 08/26/15 0849 September 04, 2015 0613  NA 143 142 143  K 4.5 4.8 5.8*  CL 98* 98* 107  CO2 34* 35* 29  BUN 69* 57* 82*  CREATININE 0.90 0.73 1.46*  GLUCOSE 122* 137* 110*    Electrolytes  Recent Labs Lab 08/22/15 0600 08/24/15 0640 08/26/15 0849 09/04/2015 0613  CALCIUM 9.1 9.4 9.3 7.9*  MG 2.8* 2.8* 2.8*  --   PHOS 6.2* 4.5 3.8  --     CBC  Recent Labs Lab 08/26/15 0849 08/26/15 1216 09/04/15 0152 2015-09-04 0613  WBC 9.4 10.1  --  9.9  HGB 8.2* 6.2* 7.3* 7.4*  HCT 29.2* 21.8* 24.5* 23.7*  PLT 235 229  --  275    Coag's  Recent Labs Lab 09/04/2015 0152  INR 1.42    Sepsis Markers No results for input(s): LATICACIDVEN, PROCALCITON, O2SATVEN in the last 168 hours.  ABG  Recent Labs Lab 09/04/15 0024  PHART 7.319*  PCO2ART 62.0*  PO2ART 133*    Liver Enzymes  Recent Labs Lab 08/22/15 0600 08/24/15 0640 08/26/15 0849  ALBUMIN 2.4* 2.6* 2.3*    Cardiac Enzymes No results for input(s): TROPONINI, PROBNP in the last 168 hours.  Glucose No results for input(s): GLUCAP in the last 168 hours.  Imaging Dg Chest Port 1  View  09-04-15  CLINICAL DATA:  Respiratory distress EXAM: PORTABLE CHEST 1 VIEW COMPARISON:  August 24, 2015 FINDINGS: There is near complete opacification of the left hemi thorax, worsened in the interval. Interstitial opacities on the right suggest edema. No other acute abnormalities. IMPRESSION: Near complete opacification of the left hemi thorax. Electronically Signed   By: Dorise Bullion III M.D   On: 2015-09-04 01:39   Dg Abd Portable 1v  08/26/2015  CLINICAL DATA:  Constipation, fecal impaction EXAM: PORTABLE ABDOMEN - 1 VIEW COMPARISON:  08/17/2015 FINDINGS: Retained radiopaque stool content in the rectum compatible with fecal impaction. Moderate retained stool throughout the entire colon. NG tube within the distal stomach. Monitor leads overlie the upper abdomen. Scoliosis of the spine again evident. No acute osseous finding. IMPRESSION: Distal fecal impaction with radiopaque stool content in the rectum. No obstruction pattern. Electronically Signed   By: Jerilynn Mages.  Shick M.D.   On: 08/26/2015 18:46    SIGNIFICANT EVENTS: 6/30 - Admit  LINES/TUBES: OETT 7.0 7/18>>> NGT >>> Foley >>> PIV x1   DISCUSSION:  Please refer to admission H&P. Initially had recommended transfer to the ICU after patient's initial cardiac arrest with ongoing GI bleeding but Dr. Tiffany Kocher declined and preferred to keep the patient managing her critical illness.   Sonia Baller Ashok Cordia, M.D. [facility] Pulmonary & Critical Care Pager:  [phone] After 3pm or if no response, call [phone] Sep 04, 2015, 11:38 AM

## 2015-09-10 NOTE — Procedures (Signed)
Consent: Procedure performed emergently with full CODE STATUS per nursing at bedside.  Medications: Please refer to code sheet documentation. No medications administered for emergent intubation.  Description of Procedure: Once patient regained spontaneous circulation she remained hypoxic. Utilizing a video laryngoscope I had excellent visualization of the patient's vocal cords and epiglottis. A 7.0 endotracheal tube was inserted between the vocal cords with ease. Balloon was inflated. Tube was secured at approximately 22 cm at the lip. Repeat capnographic color change was appreciated. Bilateral breath sounds were auscultated by respiratory therapist & symmetric chest rise was visualized.  Post Intubation: Stat Portable chest x-ray & ABG ordered.

## 2015-09-10 NOTE — H&P (Signed)
PULMONARY / CRITICAL CARE MEDICINE   Name: Kelsey Garcia MRN: [id number] DOB: Jan 30, 1946    ADMISSION DATE:  09-22-15 CONSULTATION DATE:  09/22/2015  REFERRING MD:  Dr. Tiffany Kocher / [facility]  CHIEF COMPLAINT:  Acute Hypoxic Respiratory Failure & Repeat Cardiac Arrest  HISTORY OF PRESENT ILLNESS:  70 year old female known to our service for stage IIB non-small cell lung cancer as well as recent admission and discharge from ICU on 08/09/15 to [facility]. During that admission patient presented with cyanosis and altered mental status. Patient's respiratory status deteriorated and she underwent endotracheal intubation. Patient was subsequently extubated on 6/26. Consulted this morning for worsening hypoxic respiratory failure. Patient was on BiPAP overnight. Nurse practitioner sent for evaluation I had myself and upon arrival patient appeared in clinical status. Thus, I was contacted to provide immediate bedside assistance. Upon arrival patient had declining blood pressure and mottling noted on immediate exam. With attempted pulse check this was absent and CODE BLUE was initiated. Please refer to my documentation from initial resuscitation.  PAST MEDICAL HISTORY :  Past Medical History  Diagnosis Date  . Atrial fibrillation ([place])   . Hypertension   . Insomnia   . Nonischemic cardiomyopathy ([place])   . Hyperlipidemia   . Anxiety   . Cholelithiasis   . Mitral regurgitation     noted 2010  . H/O epistaxis   . Rhinitis, allergic   . CHF (congestive heart failure) ([place])   . Pneumonia     "several times w/exacerbations of the COPD; nothing in the last year" (07/15/2012)  . COPD (chronic obstructive pulmonary disease) ([place])     as of 7/13 on 2-3L, pfts 10/2008 with mod obstruction  . Chronic bronchitis with COPD (chronic obstructive pulmonary disease) ([place])   . GERD (gastroesophageal reflux disease)   . Migraines     "weekly for awhile; cleared up as I got older"  (07/15/2012)  . DJD (degenerative joint disease)   . Arthritis     "all over" (07/15/2012)  . OCD (obsessive compulsive disorder)   . Depression     h/o SI; "last time I was really serious about it was ~ 1997" (07/15/2012)  . Thoracic vertebral fracture ([place]) 11/03/2014  . Primary lung cancer ([place]) 02/2015    stage IIB (T3, N0, M0) non-small cell lung cancer, squamous cell carcinoma. s/p XRT.    PAST SURGICAL HISTORY: Past Surgical History  Procedure Laterality Date  . Tubal ligation  1972  . Cardioversion  2003; 07/2003  . Video bronchoscopy Bilateral 02/27/2015    Procedure: VIDEO BRONCHOSCOPY WITH FLUORO;  Surgeon: Chesley Mires, MD;  Location: [facility] ENDOSCOPY;  Service: Cardiopulmonary;  Laterality: Bilateral;     Allergies  Allergen Reactions  . Ace Inhibitors Cough  . Codeine Swelling    Face and lips swelling  Patient report Can tolerate hydrocodone  . Pseudoeph-Doxylamine-Dm-Apap Itching  . Diphenhydramine Hcl Other (See Comments)    "feels like my skin is crawling, and legs twitches"  . Erythromycin Diarrhea  . Nsaids Nausea And Vomiting  . Nyquil [Pseudoeph-Doxylamine-Dm-Apap] Itching  . Tramadol Hcl Other (See Comments)    stomach pain, hallucinations  . Fosamax [Alendronate] Other (See Comments)    Nausea and abdominal bloating.   . Tomato Other (See Comments)    Stomach swells  . Orange Fruit [Citrus] Rash  . Pseudoephedrine Palpitations    No current facility-administered medications on file prior to encounter.   Current Outpatient Prescriptions on File Prior to Encounter  Medication  Sig  . albuterol (PROVENTIL) (2.5 MG/3ML) 0.083% nebulizer solution Take 3 mLs (2.5 mg total) by nebulization every 3 (three) hours as needed for wheezing or shortness of breath.  Marland Kitchen antiseptic oral rinse (CPC / CETYLPYRIDINIUM CHLORIDE 0.05%) 0.05 % LIQD solution 7 mLs by Mouth Rinse route 2 (two) times daily.  Marland Kitchen aspirin 325 MG tablet Place 1 tablet (325 mg total) into feeding tube  daily.  . bisacodyl (DULCOLAX) 10 MG suppository Place 1 suppository (10 mg total) rectally daily as needed for moderate constipation.  . busPIRone (BUSPAR) 10 MG tablet Place 1 tablet (10 mg total) into feeding tube 3 (three) times daily.  . diazepam (VALIUM) 2 MG tablet Take 1 tablet (2 mg total) by mouth every 8 (eight) hours.  Marland Kitchen diltiazem (CARDIZEM) 120 MG tablet Take 1 tablet (120 mg total) by mouth every 8 (eight) hours.  Marland Kitchen FLUoxetine (PROZAC) 20 MG capsule Take 1 capsule (20 mg total) by mouth daily.  . food thickener (THICK IT) POWD Take 1 Container by mouth as needed (use to make honey consistency).  . furosemide (LASIX) 40 MG tablet Take 40 mg by mouth daily.  . heparin 5000 UNIT/ML injection Inject 1 mL (5,000 Units total) into the skin every 8 (eight) hours.  . insulin aspart (NOVOLOG) 100 UNIT/ML injection CBG < 70: implement hypoglycemia protocol CBG 70 - 120: 0 units CBG 121 - 150: 2 units CBG 151 - 200: 3 units CBG 201 - 250: 5 units CBG 251 - 300: 8 units CBG 301 - 350: 11 units CBG 351 - 400: 15 units CBG > 400: call MD and obtain STAT lab verification  . ipratropium-albuterol (DUONEB) 0.5-2.5 (3) MG/3ML SOLN Take 3 mLs by nebulization every 6 (six) hours.  . methylPREDNISolone sodium succinate (SOLU-MEDROL) 40 mg/mL injection Inject 1 mL (40 mg total) into the vein daily.  . metoprolol tartrate (LOPRESSOR) 25 MG tablet Take 0.5 tablets (12.5 mg total) by mouth 2 (two) times daily.  . OXYGEN Inhale 2 L into the lungs continuous. 2-3L/min  . pantoprazole (PROTONIX) 40 MG tablet Take 1 tablet (40 mg total) by mouth daily.  . potassium chloride SA (KLOR-CON M20) 20 MEQ tablet Take 20 mEq by mouth daily.   . sodium chloride 0.9 % infusion Inject 10 mLs into the vein continuous.    FAMILY HISTORY:  Family History  Problem Relation Age of Onset  . Asthma    . Emphysema    . Allergies    . Cancer      aunt had several types of cancer  . COPD Mother   . Emphysema Mother    . Cirrhosis Father     SOCIAL HISTORY: Social History  Substance Use Topics  . Smoking status: Former Smoker -- 1.00 packs/day for 35 years    Types: Cigarettes    Quit date: 12/10/2014  . Smokeless tobacco: Never Used  . Alcohol Use: No    REVIEW OF SYSTEMS:  Unable to obtain given altered mental status & intubation.  SUBJECTIVE: As above.  VITAL SIGNS: (Saturation 46% on BiPAP BP 50/12 HR 143 in [facility] at onset of first cardiac arrest) BP 113/72 mmHg  Pulse 102  Resp 31  SpO2 94%  HEMODYNAMICS:    VENTILATOR SETTINGS:    INTAKE / OUTPUT:    PHYSICAL EXAMINATION: General: Nonresponsive. No family at bedside. Nurse at bedside along with PCCM NP.  Integument: Cool & dry. No rash on exposed skin. Mottling noted in extremities. Bruising of various ages  noted on extremities. Lymphatics: No appreciated cervical or supraclavicular lymphadenoapthy. HEENT: Dry mucus membranes. No oral ulcers. No scleral icterus. BiPAP mask in place. Cardiovascular: Tachycardic. No palpable pulse at onset of ACLS protocol. ROSC after ACLS protocol.  Pulmonary: BiPAP currently on but agonal respirations.  Abdomen: Soft. Protuberant. Hematochezia noted in the bed. Now patient has crepitance over left abdomen/leg. Musculoskeletal: No joint deformity or effusion appreciated. Neurological: Flaccid. No purposeful movement. Forward gaze.  Psychiatric: Unable to assess given altered mental status.  LABS:  BMET  Recent Labs Lab 08/24/15 0640 08/26/15 0849 2015/09/24 0613  NA 143 142 143  K 4.5 4.8 5.8*  CL 98* 98* 107  CO2 34* 35* 29  BUN 69* 57* 82*  CREATININE 0.90 0.73 1.46*  GLUCOSE 122* 137* 110*    Electrolytes  Recent Labs Lab 08/22/15 0600 08/24/15 0640 08/26/15 0849 September 24, 2015 0613  CALCIUM 9.1 9.4 9.3 7.9*  MG 2.8* 2.8* 2.8*  --   PHOS 6.2* 4.5 3.8  --     CBC  Recent Labs Lab 08/26/15 1216 2015-09-24 0152 09-24-15 0613 2015/09/24 1245  WBC 10.1  --   9.9 PENDING  HGB 6.2* 7.3* 7.4* 8.8*  HCT 21.8* 24.5* 23.7* 27.9*  PLT 229  --  275 235    Coag's  Recent Labs Lab September 24, 2015 0152 09/24/15 1245  APTT  --  44*  INR 1.42 1.90*    Sepsis Markers No results for input(s): LATICACIDVEN, PROCALCITON, O2SATVEN in the last 168 hours.  ABG  Recent Labs Lab 2015-09-24 0024 Sep 24, 2015 1127  PHART 7.319* 7.032*  PCO2ART 62.0* 88.7*  PO2ART 133* 129*    Liver Enzymes  Recent Labs Lab 08/22/15 0600 08/24/15 0640 08/26/15 0849  ALBUMIN 2.4* 2.6* 2.3*    Cardiac Enzymes No results for input(s): TROPONINI, PROBNP in the last 168 hours.  Glucose No results for input(s): GLUCAP in the last 168 hours.  Imaging Dg Chest Port 1 View  2015-09-24  CLINICAL DATA:  Ventilator dependence. EXAM: PORTABLE CHEST 1 VIEW COMPARISON:  09-24-2015, earlier the same day. FINDINGS: 1135 hours. Endotracheal tube tip is 3.6 cm above the base of the carina. Patient is rotated to the left. The cardio pericardial silhouette is enlarged. Slight interval improvement in aeration in the left upper lung. Diffuse interstitial opacity again noted in the right lung. The NG tube passes into the stomach although the distal tip position is not included on the film. Telemetry leads overlie the chest. Deferred later pads overlie the lower thorax. IMPRESSION: Endotracheal tube tip is 3.6 cm above the base of the carina. Slight improvement in left upper lung aeration. Electronically Signed   By: Misty Stanley M.D.   On: Sep 24, 2015 11:56   Dg Chest Port 1 View  09-24-15  CLINICAL DATA:  Respiratory distress EXAM: PORTABLE CHEST 1 VIEW COMPARISON:  August 24, 2015 FINDINGS: There is near complete opacification of the left hemi thorax, worsened in the interval. Interstitial opacities on the right suggest edema. No other acute abnormalities. IMPRESSION: Near complete opacification of the left hemi thorax. Electronically Signed   By: Dorise Bullion III M.D   On: Sep 24, 2015 01:39    Dg Abd Portable 1v  08/26/2015  CLINICAL DATA:  Constipation, fecal impaction EXAM: PORTABLE ABDOMEN - 1 VIEW COMPARISON:  08/17/2015 FINDINGS: Retained radiopaque stool content in the rectum compatible with fecal impaction. Moderate retained stool throughout the entire colon. NG tube within the distal stomach. Monitor leads overlie the upper abdomen. Scoliosis of the spine again evident.  No acute osseous finding. IMPRESSION: Distal fecal impaction with radiopaque stool content in the rectum. No obstruction pattern. Electronically Signed   By: Jerilynn Mages.  Shick M.D.   On: 08/26/2015 18:46     STUDIES:  Portable CXR 7/18:  ETT in good position. Left lung opacification persists.  MICROBIOLOGY: MRSA PCR 7/18>>>  ANTIBIOTICS: NONE  SIGNIFICANT EVENTS: 6/26 - Extubated 6/30 - Admit to [facility] from [facility] 7/18 - Transfer to ICU from [facility] after cardiac arrest x2 with GIB  LINES/TUBES: OETT 7.0 7/18>>> R Fem CVL 7/18>>> NGT >>> Foley >>> PIV x2  ASSESSMENT / PLAN:  PULMONARY A: Acute Hypoxic Respiratory Failure - Likely secondary to aspiration & anemia. Acute Hypercarbic Respiratory Failure - Post arrest. H/O COPD Primary Lung Cancer Stage IIB (T3, N0, M0) Squamous s/p XRT   P:   Full Vent Support Holding on SBT until mental status improves Duoneb q6hr Repeat ABG  CARDIOVASCULAR A:  Shock - Likely hypovolemic from ongoing GIB. S/P Repeat Cardiac Arrest - Likely due to acidosis & ongoing GIB. H/O Atrial fibrillation H/O HTN H/O Hyperlipidemia H/O CHF  P:  Continuous telemetry monitoring Vitals per unit protocol Levophed & Neo-Synephrine drips to maintain MAP >65 & SBP >90 Vasopressin gtt for GI Bleed Troponin I now then q6hr  GASTROINTESTINAL A:   Active GIB  P:   GI Consulted & Following NPO Protonix IV bolus then drip Stat KUB X-ray  HEMATOLOGIC A:   Anemia - Secondary to active GIB. S/P 2u PRBC at [facility] & 2u PRBC emergent release on ICU arrival. Coagulopathy  P:   Transfusing 2u PRBC stat Transfusing 2u FFP stat SCDs Trending cell counts daily Post transfusion Coags & Hgb/Hct  RENAL A:   Acute Renal Failure Hyperkalemia - Mild.  P:   Checking electrolytes stat Trending UOP with foley Trending Lactic Acid q6hr  INFECTIOUS A:   No evidence of infection.  P:   Monitor for fever & leukocytosis  ENDOCRINE A:   No acute issue.   P:   Trending glucose q4hr with MD notification parameters  NEUROLOGIC A:   Acute Encephalopathy - Toxic Metabolic vs Anoxic. Sedation on Ventilator H/O Depression  P:   RASS goal: 0 Versed IV prn  FAMILY  - Updates: Daughter Carlis Stable updated by Dr. Ashok Cordia via phone 7/18.  - Inter-disciplinary family meet or Palliative Care meeting due by:  7/25  DISCUSSION:  70 year old female with multiple medical problems and now with acute GI Bleeding. Patient has ongoing vasopressor requirements and worsening hyperkalemia. She has now suffered 3 separate cardiac arrest events. After initial cardiac arrest event I did discuss my recommendation for ICU transfer but Dr. Tiffany Kocher declined. After the patient's second cardiac arrest I was contacted to set the patient in transfer to our intensive care unit. Patient's prognosis is poor and I suspect she will not survive this acute illness. Plan for family discussion upon arrival.  I have spent a total of 61 minutes of critical care time today caring for the patient independent of time spent during procedures, discussing plan of care with Dr. Tiffany Kocher at bedside, and reviewing the patient's electronic medical record.   Sonia Baller Ashok Cordia, M.D. [facility] Pulmonary & Critical Care Pager:  [phone] After 3pm or if no response, call [phone] Sep 04, 2015, 1:32 PM

## 2015-09-10 NOTE — Code Documentation (Signed)
PCCM Attending: After arrival in the intensive care unit patient had a third cardiac arrest initially with pulseless electrical activity. ACLS protocol was initiated and CPR was started. After receiving bolus doses of IV epinephrine and IV bicarbonate patient was found to have ventricular tachycardia on telemetry and shock was administered. CPR and ACLS algorithm was continued while emergent right femoral central venous access was placed. After central venous access was placed line was used to administer further continuous vasopressor medications as well as bolus medications. Patient regained spontaneous circulation. 2 units of emergent release packed red blood cells were requested for rapid infusion and subsequently administered. Patient's family previously contacted by myself regarding her clinical status and code wishes. At that time family wished patient to remain full code.  Sonia Baller Ashok Cordia, M.D. [facility] Pulmonary & Critical Care Pager:  [phone] After 3pm or if no response, call [phone] 1:43 PM 09/16/2015

## 2015-09-10 NOTE — Progress Notes (Signed)
Critical Kcl 7.1 and troponin .07 reported to Dr. Ashok Cordia

## 2015-09-10 NOTE — Code Documentation (Signed)
PCCM Code Documentation: Upon my arrival in the room patient's blood pressure was continuing to deteriorate and no pulse was palpable. Patient was progressively becoming more hypoxic but maintained electrical activity on telemetry monitoring. Instructed nurse at bedside to call a CODE BLUE and upon arrival of staff CPR was initiated with chest compressions and bag mask ventilation. Patient regurgitated bilious green liquid into her posterior pharynx which was aggressively suctioned. Epinephrine 1 mg was administered IV push. Chest compressions were continued and ultimately defibrillation pads were placed allowing for palpation of a pulse. Patient regained spontaneous circulation and chest compressions were continued briefly for cardiac support while awaiting repeat blood pressure. As blood pressure stabilized patient remained hypoxic and was emergently intubated by myself. Please refer to code she documentation by staff. Status post intubation ABG & portable chest x-ray pending.   Sonia Baller Ashok Cordia, M.D. [facility] Pulmonary & Critical Care Pager:  [phone] After 3pm or if no response, call [phone] 11:37 AM 09/23/2015

## 2015-09-10 DEATH — deceased

## 2015-09-26 ENCOUNTER — Encounter: Payer: Medicare Other | Admitting: Internal Medicine

## 2016-01-23 ENCOUNTER — Ambulatory Visit (HOSPITAL_COMMUNITY): Payer: Medicare Other

## 2016-01-24 ENCOUNTER — Ambulatory Visit: Payer: Self-pay | Admitting: Radiation Oncology

## 2016-09-06 IMAGING — CR DG CHEST 2V
2 series · 2 of 2 positions shown · non-contrast
Comparison: 06/19/2014; 05/19/2014; chest CT - 05/20/2014

CLINICAL DATA: Sore throat and eye swelling after recent medication
adjustment. History of multiple prior medication allergies.

EXAM:
CHEST  2 VIEW

[w chest lat]
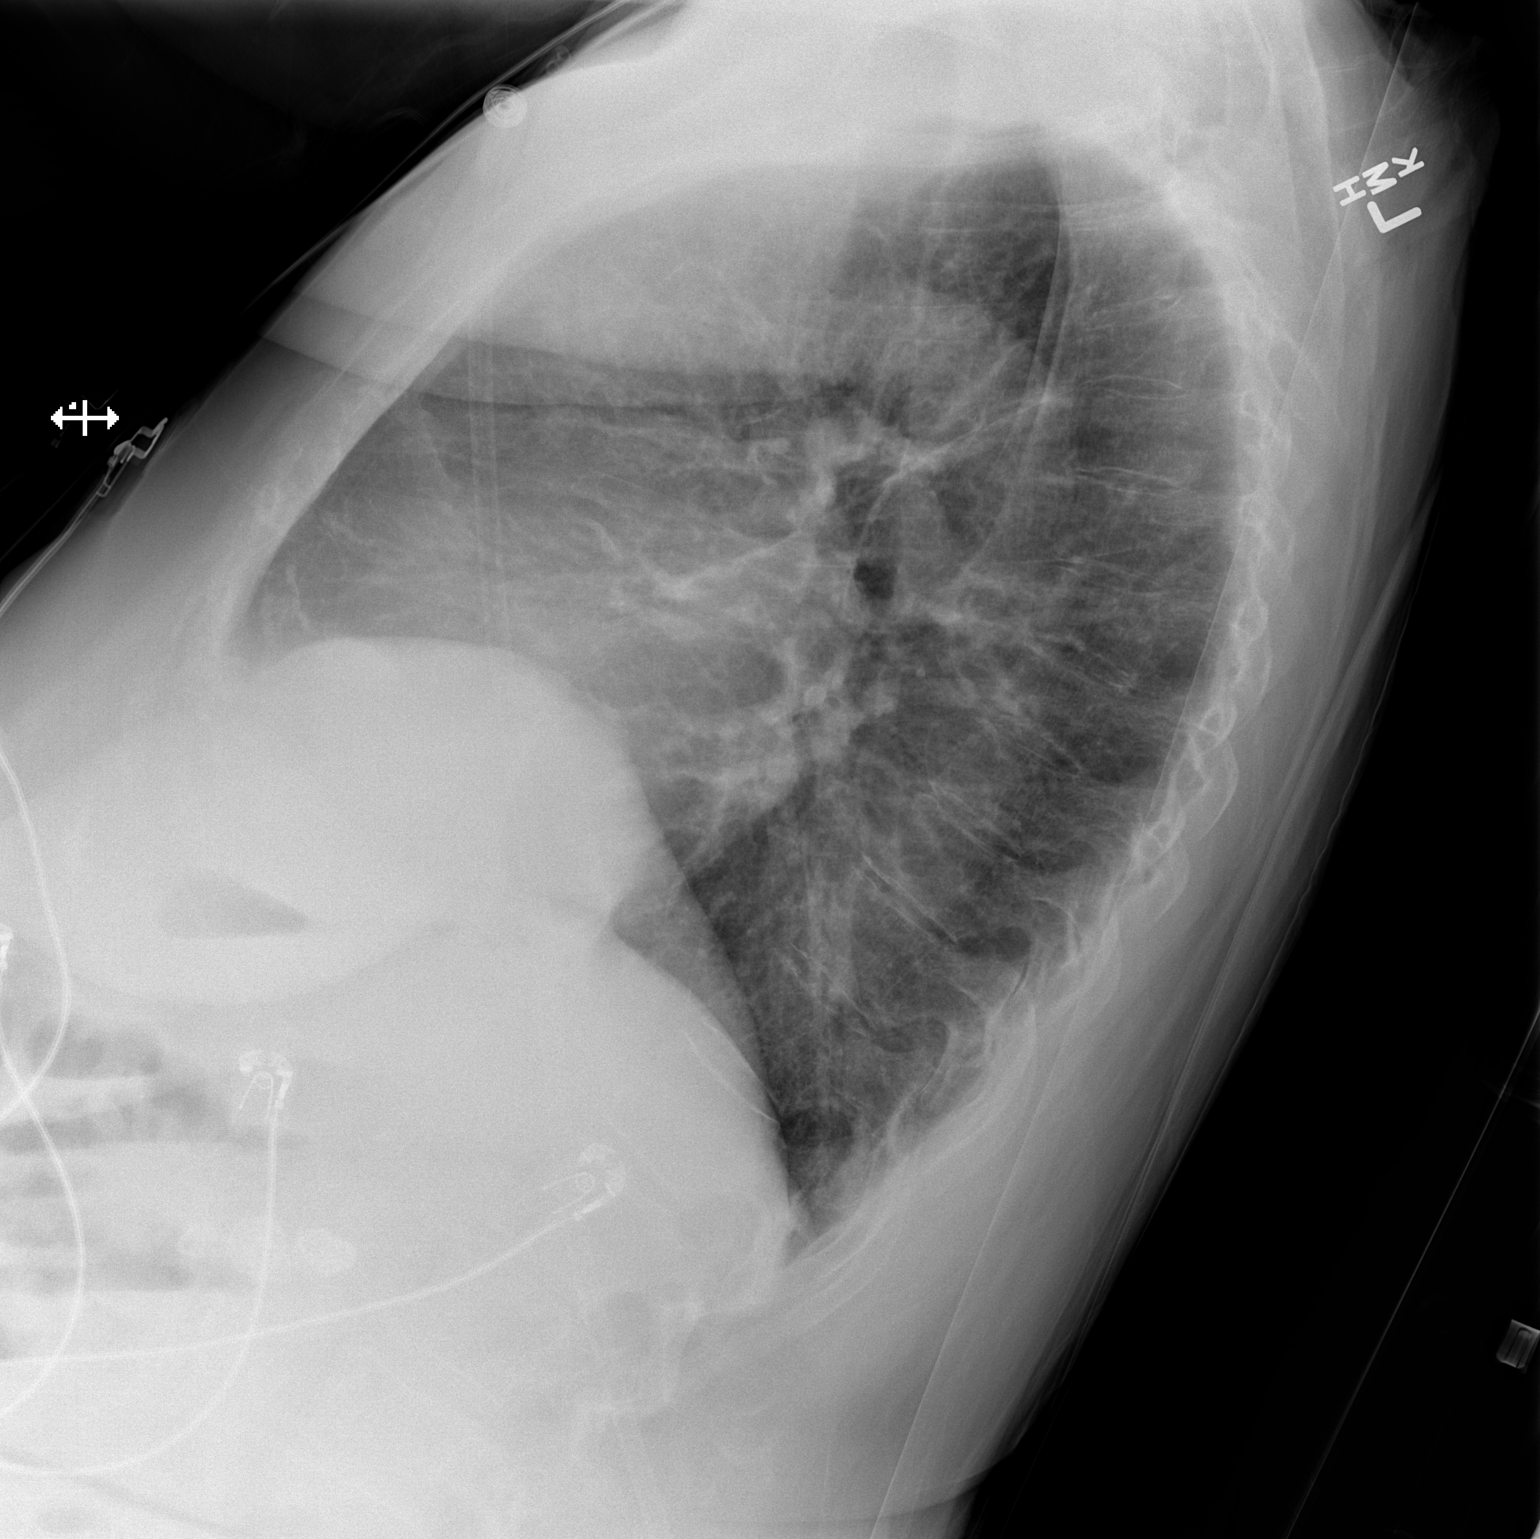

[x chest ap]
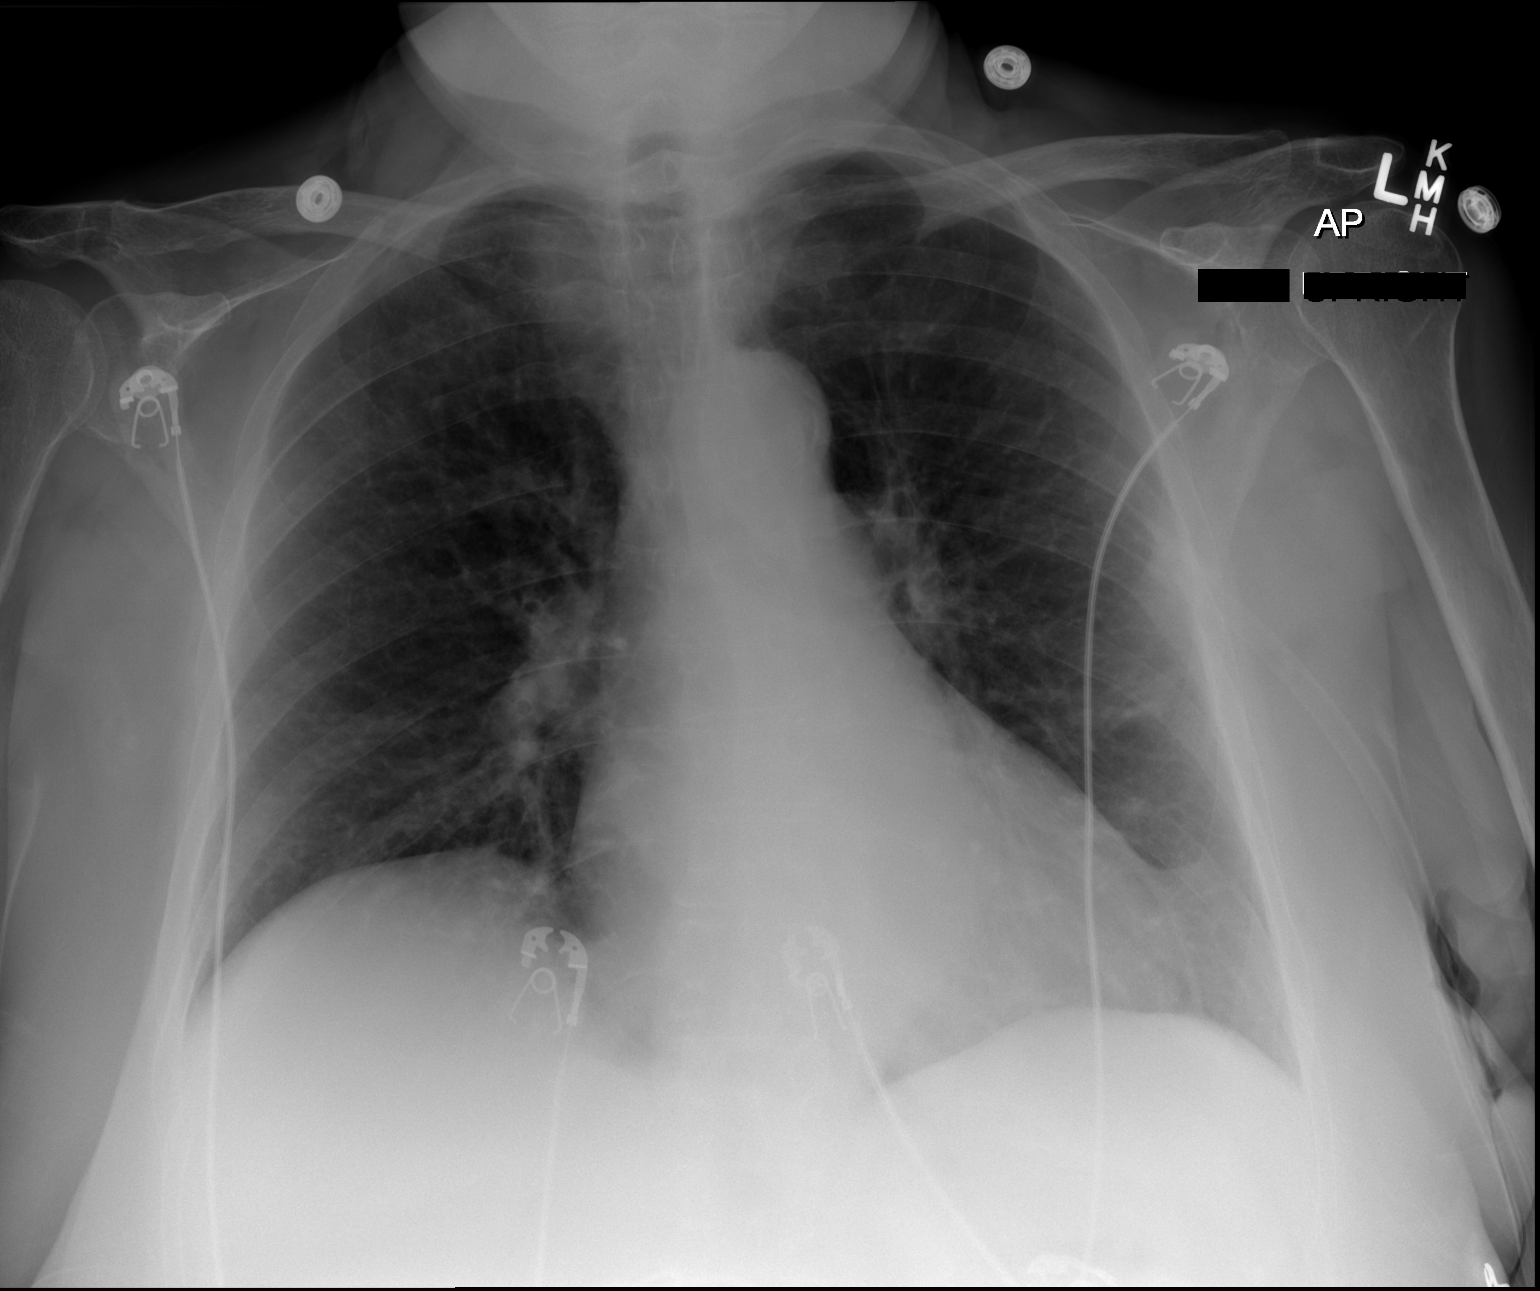

[2 of 2 positions shown; findings below may reference images not displayed]

FINDINGS: Grossly unchanged borderline enlarged cardiac silhouette and
mediastinal contours with mild tortuosity of the thoracic aorta.
Overall improved aeration of the lungs with persistent minimal
bibasilar opacities there is persistent mild elevation / eventration
of the anterior aspect the right hemidiaphragm. No new focal
airspace opacities. No pleural effusion or pneumothorax. No evidence
of edema. No acute osseous abnormalities. Calcifications seen best
on the provided lateral radiograph correlate with the cholelithiasis
demonstrated on recently obtained chest CT.
IMPRESSION: 1. Improved aeration of the lungs without acute cardiopulmonary
disease.
2. Cholelithiasis.

## 2017-05-15 IMAGING — CT NM PET TUM IMG INITIAL (PI) SKULL BASE T - THIGH
1 of 8 series · 1 of 25 positions shown · non-contrast
Comparison: Chest CT on 02/25/2015

CLINICAL DATA: Initial treatment strategy for left lung squamous
cell carcinoma on bronchoscopic brushing.

EXAM:
NUCLEAR MEDICINE PET SKULL BASE TO THIGH
TECHNIQUE: 9.4 mCi F-18 FDG was injected intravenously. Full-ring PET imaging
was performed from the skull base to thigh after the radiotracer. CT
data was obtained and used for attenuation correction and anatomic
localization.
FASTING BLOOD GLUCOSE:  Value: 82 mg/dl

[Series 4: ct sk_thigh 5.0 hd_fov · axial · 5.0mm · 1.07mm/px · 1 of 206 slices shown]
[im 206/206  brain]
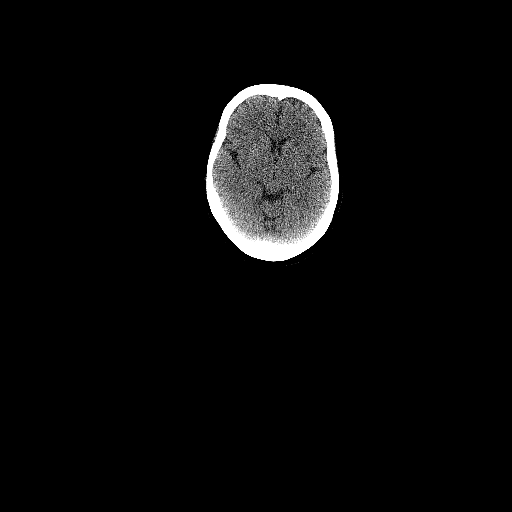

[1 of 25 positions shown; findings below may reference images not displayed]

FINDINGS: NECK

No hypermetabolic lymph nodes in the neck.

CHEST

Previously seen airspace opacity in the lingula shows interval
improvement since prior study. This shows low-grade metabolic
activity with SUV max of 3.0, which is suspicious for
postobstructive pneumonitis or other infectious or inflammatory
process.

A solitary focus of hypermetabolic activity is seen in the left
hilum on image 56 of series 3 which has SUV max of 4.1. No
corresponding mass or adenopathy is visualized by CT. No other
hypermetabolic hilar or mediastinal masses or lymph nodes are
identified.

ABDOMEN/PELVIS

No abnormal hypermetabolic activity within the liver, pancreas,
adrenal glands, or spleen. No hypermetabolic lymph nodes in the
abdomen or pelvis.

Focal hypermetabolic activity is seen at the anorectal junction
corresponding with asymmetric soft tissue prominence. This has SUV
max of 12.4. Anorectal carcinoma cannot be excluded.

Cholelithiasis is noted, without evidence of cholecystitis. Sigmoid
diverticulosis is also demonstrated, without evidence of
diverticulitis.

SKELETON

No focal hypermetabolic activity to suggest skeletal metastasis.
IMPRESSION: Interval improvement in lingular airspace disease with low-grade
metabolic activity. This is likely due to improving postobstructive
pneumonitis.

Small solitary focus of hypermetabolic activity in left hilum with
SUV max of 4.1, without evidence of corresponding mass by CT. This
likely corresponds to endobronchial malignancy recently diagnosed by
bronchoscopic brushing.

No other metastatic disease identified.

Hypermetabolic activity at the anorectal junction corresponding with
asymmetric soft tissue prominence PET-CT. Anorectal carcinoma cannot
be excluded. Recommend correlation with rectal exam and possible
proctoscopy.
